# Patient Record
Sex: Female | Born: 1955
Health system: Southern US, Community
[De-identification: ages and names within clinical notes are randomized; demographics above are authoritative.]

## PROBLEM LIST (undated history)

## (undated) DIAGNOSIS — I2510 Atherosclerotic heart disease of native coronary artery without angina pectoris: Secondary | ICD-10-CM

## (undated) DIAGNOSIS — T7840XA Allergy, unspecified, initial encounter: Secondary | ICD-10-CM

## (undated) DIAGNOSIS — K219 Gastro-esophageal reflux disease without esophagitis: Secondary | ICD-10-CM

## (undated) DIAGNOSIS — F32A Depression, unspecified: Secondary | ICD-10-CM

## (undated) DIAGNOSIS — K5792 Diverticulitis of intestine, part unspecified, without perforation or abscess without bleeding: Secondary | ICD-10-CM

## (undated) DIAGNOSIS — G478 Other sleep disorders: Secondary | ICD-10-CM

## (undated) DIAGNOSIS — E119 Type 2 diabetes mellitus without complications: Secondary | ICD-10-CM

## (undated) DIAGNOSIS — G4730 Sleep apnea, unspecified: Secondary | ICD-10-CM

## (undated) DIAGNOSIS — F419 Anxiety disorder, unspecified: Secondary | ICD-10-CM

## (undated) DIAGNOSIS — I10 Essential (primary) hypertension: Secondary | ICD-10-CM

## (undated) DIAGNOSIS — J449 Chronic obstructive pulmonary disease, unspecified: Secondary | ICD-10-CM

## (undated) DIAGNOSIS — E079 Disorder of thyroid, unspecified: Secondary | ICD-10-CM

## (undated) DIAGNOSIS — F329 Major depressive disorder, single episode, unspecified: Secondary | ICD-10-CM

## (undated) DIAGNOSIS — E785 Hyperlipidemia, unspecified: Secondary | ICD-10-CM

## (undated) HISTORY — DX: Disorder of thyroid, unspecified: E07.9

## (undated) HISTORY — DX: Depression, unspecified: F32.A

## (undated) HISTORY — PX: COLONOSCOPY: SHX174

## (undated) HISTORY — DX: Diverticulitis of intestine, part unspecified, without perforation or abscess without bleeding: K57.92

## (undated) HISTORY — DX: Chronic obstructive pulmonary disease, unspecified: J44.9

## (undated) HISTORY — DX: Atherosclerotic heart disease of native coronary artery without angina pectoris: I25.10

## (undated) HISTORY — PX: TUBAL LIGATION: SHX77

## (undated) HISTORY — PX: COLON SURGERY: SHX602

## (undated) HISTORY — PX: DENTAL SURGERY: SHX609

## (undated) HISTORY — DX: Essential (primary) hypertension: I10

## (undated) HISTORY — DX: Sleep apnea, unspecified: G47.30

## (undated) HISTORY — DX: Other sleep disorders: G47.8

## (undated) HISTORY — PX: BREAST EXCISIONAL BIOPSY: SUR124

## (undated) HISTORY — DX: Major depressive disorder, single episode, unspecified: F32.9

## (undated) HISTORY — DX: Hyperlipidemia, unspecified: E78.5

## (undated) HISTORY — PX: CHOLECYSTECTOMY: SHX55

## (undated) HISTORY — DX: Gastro-esophageal reflux disease without esophagitis: K21.9

## (undated) HISTORY — PX: OTHER SURGICAL HISTORY: SHX169

## (undated) HISTORY — DX: Allergy, unspecified, initial encounter: T78.40XA

## (undated) HISTORY — DX: Anxiety disorder, unspecified: F41.9

## (undated) HISTORY — DX: Type 2 diabetes mellitus without complications: E11.9

---

## 1998-11-06 ENCOUNTER — Emergency Department (HOSPITAL_COMMUNITY): Admission: EM | Admit: 1998-11-06 | Discharge: 1998-11-06 | Payer: Self-pay | Admitting: Emergency Medicine

## 1998-11-07 ENCOUNTER — Encounter: Payer: Self-pay | Admitting: Emergency Medicine

## 1999-01-31 ENCOUNTER — Other Ambulatory Visit: Admission: RE | Admit: 1999-01-31 | Discharge: 1999-01-31 | Payer: Self-pay | Admitting: Obstetrics and Gynecology

## 2003-03-06 ENCOUNTER — Other Ambulatory Visit: Admission: RE | Admit: 2003-03-06 | Discharge: 2003-03-06 | Payer: Self-pay | Admitting: Internal Medicine

## 2003-03-31 ENCOUNTER — Emergency Department (HOSPITAL_COMMUNITY): Admission: EM | Admit: 2003-03-31 | Discharge: 2003-03-31 | Payer: Self-pay | Admitting: Emergency Medicine

## 2004-03-17 ENCOUNTER — Other Ambulatory Visit (HOSPITAL_COMMUNITY): Admission: RE | Admit: 2004-03-17 | Discharge: 2004-04-08 | Payer: Self-pay | Admitting: Psychiatry

## 2004-04-01 ENCOUNTER — Encounter: Admission: RE | Admit: 2004-04-01 | Discharge: 2004-04-01 | Payer: Self-pay | Admitting: Psychiatry

## 2004-04-30 ENCOUNTER — Emergency Department (HOSPITAL_COMMUNITY): Admission: EM | Admit: 2004-04-30 | Discharge: 2004-04-30 | Payer: Self-pay | Admitting: *Deleted

## 2005-06-02 ENCOUNTER — Emergency Department (HOSPITAL_COMMUNITY): Admission: EM | Admit: 2005-06-02 | Discharge: 2005-06-02 | Payer: Self-pay | Admitting: Emergency Medicine

## 2006-05-07 ENCOUNTER — Inpatient Hospital Stay (HOSPITAL_COMMUNITY): Admission: EM | Admit: 2006-05-07 | Discharge: 2006-05-10 | Payer: Self-pay | Admitting: Emergency Medicine

## 2006-06-01 ENCOUNTER — Encounter: Admission: RE | Admit: 2006-06-01 | Discharge: 2006-06-01 | Payer: Self-pay | Admitting: General Surgery

## 2006-06-23 ENCOUNTER — Ambulatory Visit (HOSPITAL_COMMUNITY): Admission: RE | Admit: 2006-06-23 | Discharge: 2006-06-23 | Payer: Self-pay | Admitting: General Surgery

## 2006-06-25 ENCOUNTER — Encounter: Payer: Self-pay | Admitting: Vascular Surgery

## 2006-06-25 ENCOUNTER — Emergency Department (HOSPITAL_COMMUNITY): Admission: EM | Admit: 2006-06-25 | Discharge: 2006-06-25 | Payer: Self-pay | Admitting: Emergency Medicine

## 2006-08-12 ENCOUNTER — Encounter (INDEPENDENT_AMBULATORY_CARE_PROVIDER_SITE_OTHER): Payer: Self-pay | Admitting: Specialist

## 2006-08-12 ENCOUNTER — Ambulatory Visit (HOSPITAL_COMMUNITY): Admission: RE | Admit: 2006-08-12 | Discharge: 2006-08-12 | Payer: Self-pay | Admitting: Gastroenterology

## 2006-08-13 ENCOUNTER — Ambulatory Visit (HOSPITAL_COMMUNITY): Admission: RE | Admit: 2006-08-13 | Discharge: 2006-08-13 | Payer: Self-pay | Admitting: Gastroenterology

## 2006-12-10 ENCOUNTER — Encounter (INDEPENDENT_AMBULATORY_CARE_PROVIDER_SITE_OTHER): Payer: Self-pay | Admitting: Specialist

## 2006-12-10 ENCOUNTER — Inpatient Hospital Stay (HOSPITAL_COMMUNITY): Admission: RE | Admit: 2006-12-10 | Discharge: 2006-12-18 | Payer: Self-pay | Admitting: General Surgery

## 2007-01-14 ENCOUNTER — Ambulatory Visit (HOSPITAL_COMMUNITY): Admission: RE | Admit: 2007-01-14 | Discharge: 2007-01-14 | Payer: Self-pay | Admitting: Family Medicine

## 2010-12-21 ENCOUNTER — Encounter: Payer: Self-pay | Admitting: Family Medicine

## 2011-04-17 NOTE — Op Note (Signed)
NAMECHRISELDA, LEPPERT                ACCOUNT NO.:  [id number]   MEDICAL RECORD NO.:  [id number]          PATIENT TYPE:  INP   LOCATION:  2550                         FACILITY:  [facility]   PHYSICIAN:  Ollen Gross. Vernell Morgans, M.D. DATE OF BIRTH:  [birth year]   DATE OF PROCEDURE:  12/10/2006  DATE OF DISCHARGE:                               OPERATIVE REPORT   PREOPERATIVE DIAGNOSIS:  History of complicated sigmoid diverticulitis  and gallstones.   POSTOPERATIVE DIAGNOSIS:  History of complicated sigmoid diverticulitis  and gallstones.   PROCEDURE:  Sigmoid colectomy and cholecystectomy.   SURGEON:  Ollen Gross. Vernell Morgans, M.D.   ASSISTANT:  Wilmon Arms. Tsuei, M.D.   ANESTHESIA:  General endotracheal.   PROCEDURE:  After informed consent was obtained, the patient was brought  to the operating room and placed in the supine position on the operating  table.  After adequate induction of general anesthesia, the patient was  moved into lithotomy position, and her abdomen and perineal area was  prepped with Betadine and draped in the usual sterile manner.   A midline incision was made with a 10 blade knife.  This incision was  carried down through the skin and subcutaneous tissue sharply with  electrocautery until the linea alba was identified.  The linea alba was  also incised with the electrocautery.  The preperitoneal space was  probed bluntly with a hemostat until the peritoneum was identified.  The  peritoneum was grasped between hemostats and opened with Metzenbaum  scissors.  This allowed access to the abdominal cavity.  The rest of the  incision was then opened under direct vision with the electrocautery.  There were no adhesions to the anterior abdominal wall.  The patient was  placed in some Trendelenburg position.  There was some omentum that was  adherent to the left lower quadrant, and this was mobilized by a  combination of sharp Bovie dissection and division of these adhesions  with the  LigaSure.  Once this was accomplished, the whole left colon and  sigmoid colon were able to be evaluated.  The area of the sigmoid colon  appeared to be the mesentery.  At this point, it appeared to be a little  bit thicker than the rest of the colon, and the area for resection was  identified.  The rest of the actual colon looked good on the surface.  A  site was chosen above the thickened area, and the mesentery at this  point was opened sharply with the electrocautery.  An Freida Busman and Kocher  clamp were placed across the colon at this point and clamped.  The colon  was divided between these two.  The mesentery to the sigmoid colon was  then taken down with the LigaSure.  One vessel was also suture ligated  with a 3-0 silk stitch.  A site was chosen below the area of thickening  in the rectosigmoid also for division of the colon, and the mesenteric  dissection was brought up to the colon wall at this point.  A Satinsky  clamp and a Kocher clamp were placed  across the colon at this point, and  then the colon was divided between these two.  Once this was  accomplished, the sigmoid colon was removed and sent to pathology for  further evaluation.  The proximal and distal ends of colon reached each  other easily without any tension, and both appeared to be very healthy.  An end-to-end anastomosis was created with full-thickness 2-0 silk  stitches.  This was done interrupted circumferentially.  Once this was  accomplished, the anastomosis appeared to be nicely approximated without  any evidence of leak, and both ends appeared to be healthy.  There was  no tension.  The abdomen was then irrigated with copious amounts of  saline.   At this point, the patient was then placed in some reverse  Trendelenburg.  A Bookwalter retractor was deployed.  The gallbladder  was readily identified.  The base of the gallbladder was then dissected  by some sharp Bovie dissection to open the peritoneal reflection,   and  then blunt dissection was carried out in this area until the gallbladder  neck/cystic duct junction was readily identified, and a good window was  created.  Two clips were placed on the cystic duct and one on the  gallbladder neck, and the cystic duct was divided between the two.  The  cystic artery was also identified and again dissected bluntly in a  circumferential manner until a good window was created.  Two clips were  placed proximally and one distally on the artery, and the artery was  divided between the two.  Next, the gallbladder was dissected away from  the liver bed by top-down dissection technique using the electrocautery.  Once this was accomplished, the gallbladder was removed from the liver  bed and sent to pathology.  The liver bed was examined and found to be  completely hemostatic.  Again, the abdomen was irrigated with copious  amounts of saline.  All the retractors were removed.  The colon  anastomosis was again checked and looked very good.  At this point, the  fascia of the anterior abdominal wall was closed with two running #1  looped PDS sutures.  The subcutaneous tissue was irrigated with copious  amounts of saline and Betadine, and the skin was closed with staples.   The patient tolerated the procedure well.  At the end of the case, all  needle, sponge, and instrument  counts were correct.  The patient was  then awakened and taken to recovery in stable condition.      Ollen Gross. Vernell Morgans, M.D.  Electronically Signed     PST/MEDQ  D:  12/10/2006  T:  12/10/2006  Job:  [id number]

## 2011-04-17 NOTE — Discharge Summary (Signed)
Felicia Hanson, Felicia Hanson                ACCOUNT NO.:  [id number]   MEDICAL RECORD NO.:  [id number]          PATIENT TYPE:  INP   LOCATION:  5706                         FACILITY:  [facility]   PHYSICIAN:  Ollen Gross. Vernell Morgans, M.D. DATE OF BIRTH:  1956-04-08   DATE OF ADMISSION:  12/10/2006  DATE OF DISCHARGE:  12/18/2006                               DISCHARGE SUMMARY   HISTORY:  Ms. Heslin is a 55 year old white female who had a history of  complicated sigmoid diverticulitis as well as gallstones.  She was  brought to the operating room on December 10, 2006 and underwent a  sigmoid colectomy and cholecystectomy.  She tolerated the surgery well.  She was left on ice chips until December 14, 2006, at which time she  began to have bowel sounds.  She was started on clear liquids on December 14, 2006 and was ambulating.  Once she was taking p.o.'s her psych  medications were restarted and she continued to slowly improve.  She did  develop some cellulitis in her incision and she was started on Cipro and  Flagyl.  On December 17, 2006 her incision was opened and a small amount  of seroma fluid was drained, but there was no gross infection.  She  continued to improve and on December 18, 2006 she was ready for discharge  home.   DISCHARGE DIET:  Her diet is as tolerated.   ACTIVITIES:  No heavy lifting.   FINAL DIAGNOSIS:  Sigmoid diverticulitis and gallstones.   DISCHARGE MEDICATIONS:  She was to resume her home medications.  She was  given a prescription for Vicodin for pain.   FOLLOWUPS:  Home health nursing was arranged for dressing changes.  Followup with Dr. Carolynne Edouard in 1 week.   DISPOSITION:  She is discharged to home.      Ollen Gross. Vernell Morgans, M.D.  Electronically Signed     PST/MEDQ  D:  03/31/2007  T:  03/31/2007  Job:  [id number]

## 2011-04-17 NOTE — Op Note (Signed)
Felicia Hanson, Felicia Hanson                ACCOUNT NO.:  [id number]   MEDICAL RECORD NO.:  [id number]          PATIENT TYPE:  AMB   LOCATION:  ENDO                         [facility]AC[facility]   PHYSICIAN:  Shirley Friar, MDDATE OF BIRTH:  [birth year]   DATE OF PROCEDURE:  08/12/2006  DATE OF DISCHARGE:                                 OPERATIVE REPORT   PROCEDURE:  Upper endoscopy.   INDICATIONS:  Heartburn.   MEDICATIONS:  Propofol infusion by Anesthesia.   FINDINGS:  The endoscope was inserted through the oropharynx and the  esophagus was intubated.  The distal portion of the esophagus revealed mild  erythema and one superficial ulceration consistent with LA grade A erosive  esophagitis.  Endoscope was passed down to the stomach, which was normal in  its entirety including normal body, antrum, fundus, cardia and angularis.  Retroflexion was done to confirm a normal proximal stomach.  Endoscope was  straightened and advanced into the duodenal bulb, where scattered  erythematous folds were noted with some nodularity consistent with  duodenitis.  One biopsy was taken of this area.  Endoscope was then advanced  down to the second portion of the duodenum, which was normal in appearance.   ASSISTANT:  1. Mild erosive esophagitis.  2. Mild duodenitis, status post biopsy.   PLAN:  1. Prevacid 30 mg p.o. daily x8 weeks.  2. Follow-up on pathology.  3. Avoid NSAIDs.      Shirley Friar, MD  Electronically Signed     VCS/MEDQ  D:  08/12/2006  T:  08/13/2006  Job:  [id number]   cc:   Ollen Gross. Vernell Morgans, M.D.  [address]  [place]  [place] [address]   Stan Head. Cleta Alberts, M.D.  Fax: [phone]

## 2011-04-17 NOTE — Op Note (Signed)
NAMEVERONDA, Felicia Hanson                ACCOUNT NO.:  [id number]   MEDICAL RECORD NO.:  [id number]          PATIENT TYPE:  AMB   LOCATION:  ENDO                         [facility]   PHYSICIAN:  Shirley Friar, MDDATE OF BIRTH:  Jun 13, 1956   DATE OF PROCEDURE:  08/12/2006  DATE OF DISCHARGE:                                 OPERATIVE REPORT   PROCEDURE:  Colonoscopy.   INDICATIONS:  History of diverticulitis, screening.   MEDICATIONS:  Propofol infusion by Anesthesia.   FINDINGS:  Rectal exam was normal.  A adult adjustable colonoscope was  inserted into a fair prepped colon and advanced to cecum where the ileocecal  valve and appendiceal orifice were identified.  On careful withdrawal of the  colonoscope revealed a focal erythematous area at the splenic flexure  possibly secondary to suction trauma.  This area was biopsied x2.  A small  amount of post biopsy bleeding was noted that resolved spontaneously.  On  further withdrawal the colonoscope revealed small scattered sigmoid  diverticulosis.  No mass or other mucosal abnormalities were seen.  Retroflexion showed small internal hemorrhoids.   ASSESSMENT:  1. Sigmoid diverticulosis (small, scattered).  2. Small internal hemorrhoids.  3. Local erythematous areas, splenic flexure, likely due to suction -      status post biopsy x2.   PLAN:  1. High-fiber diet.  2. No aspirin products for 14 days.  3. Follow-up on path.  4. Repeat colonoscopy in 5 years.      Shirley Friar, MD  Electronically Signed     VCS/MEDQ  D:  08/12/2006  T:  08/13/2006  Job:  [id number]   cc:   Felicia Hanson. Felicia Hanson, M.D.  [address]  [place]  [place] [address]   Felicia Hanson. Felicia Hanson, M.D.  Fax: [phone]

## 2011-05-05 ENCOUNTER — Emergency Department (HOSPITAL_COMMUNITY)
Admission: EM | Admit: 2011-05-05 | Discharge: 2011-05-05 | Discharge status: Against Medical Advice | Payer: 59 | Attending: Emergency Medicine | Admitting: Emergency Medicine

## 2011-05-05 DIAGNOSIS — F341 Dysthymic disorder: Secondary | ICD-10-CM | POA: Insufficient documentation

## 2011-05-05 DIAGNOSIS — E039 Hypothyroidism, unspecified: Secondary | ICD-10-CM | POA: Insufficient documentation

## 2011-05-05 DIAGNOSIS — R079 Chest pain, unspecified: Secondary | ICD-10-CM | POA: Insufficient documentation

## 2011-05-05 DIAGNOSIS — R609 Edema, unspecified: Secondary | ICD-10-CM | POA: Insufficient documentation

## 2012-02-04 ENCOUNTER — Other Ambulatory Visit: Payer: Self-pay | Admitting: Family Medicine

## 2012-03-07 ENCOUNTER — Other Ambulatory Visit: Payer: Self-pay | Admitting: Family Medicine

## 2012-03-30 ENCOUNTER — Other Ambulatory Visit: Payer: Self-pay | Admitting: Physician Assistant

## 2012-05-03 ENCOUNTER — Other Ambulatory Visit: Payer: Self-pay | Admitting: Family Medicine

## 2012-05-12 ENCOUNTER — Other Ambulatory Visit: Payer: Self-pay | Admitting: Family Medicine

## 2012-06-06 ENCOUNTER — Telehealth: Payer: Self-pay

## 2012-06-06 MED ORDER — AMLODIPINE BESYLATE 5 MG PO TABS: 5.0000 mg | ORAL_TABLET | Freq: Every day | ORAL | Status: DC

## 2012-06-06 MED ORDER — LEVOTHYROXINE SODIUM 125 MCG PO TABS: 125.0000 ug | ORAL_TABLET | Freq: Every day | ORAL | Status: DC

## 2012-06-06 MED ORDER — PRAVASTATIN SODIUM 40 MG PO TABS: 40.0000 mg | ORAL_TABLET | Freq: Every day | ORAL | Status: DC

## 2012-06-06 NOTE — Telephone Encounter (Signed)
Is it possible to give her enough to last until Friday or should she come into walk in clinic for refill?

## 2012-06-06 NOTE — Telephone Encounter (Signed)
Patient made an appt with Dr Audria Nine for Friday for refills for thyroid,cholesterol, and blood pressure. She is currently out of her meds, is having bad headaches, and wanted to know if we could call in enough to last her until Friday. [phone]  [facility] [address]

## 2012-06-06 NOTE — Telephone Encounter (Signed)
Patient notified

## 2012-06-06 NOTE — Telephone Encounter (Signed)
Rx sent to pharmacy   

## 2012-06-10 ENCOUNTER — Ambulatory Visit: Payer: Self-pay | Admitting: Family Medicine

## 2012-08-19 ENCOUNTER — Ambulatory Visit (INDEPENDENT_AMBULATORY_CARE_PROVIDER_SITE_OTHER): Payer: 59 | Admitting: Emergency Medicine

## 2012-08-19 VITALS — BP 144/86 | HR 82 | Temp 98.2°F | Resp 16 | Ht 59.5 in | Wt 168.8 lb

## 2012-08-19 DIAGNOSIS — I10 Essential (primary) hypertension: Secondary | ICD-10-CM

## 2012-08-19 DIAGNOSIS — R350 Frequency of micturition: Secondary | ICD-10-CM

## 2012-08-19 DIAGNOSIS — N3000 Acute cystitis without hematuria: Secondary | ICD-10-CM

## 2012-08-19 DIAGNOSIS — E039 Hypothyroidism, unspecified: Secondary | ICD-10-CM

## 2012-08-19 LAB — POCT URINALYSIS DIPSTICK
Bilirubin, UA: NEGATIVE
Glucose, UA: NEGATIVE
Ketones, UA: NEGATIVE
Nitrite, UA: NEGATIVE
Protein, UA: NEGATIVE
Spec Grav, UA: 1.02
Urobilinogen, UA: 1
pH, UA: 7

## 2012-08-19 LAB — POCT UA - MICROSCOPIC ONLY
Casts, Ur, LPF, POC: NEGATIVE
Crystals, Ur, HPF, POC: NEGATIVE
Mucus, UA: POSITIVE
Yeast, UA: NEGATIVE

## 2012-08-19 MED ORDER — LEVOTHYROXINE SODIUM 125 MCG PO TABS: 125.0000 ug | ORAL_TABLET | Freq: Every day | ORAL | Status: DC

## 2012-08-19 MED ORDER — PRAVASTATIN SODIUM 40 MG PO TABS: 40.0000 mg | ORAL_TABLET | Freq: Every day | ORAL | Status: DC

## 2012-08-19 MED ORDER — VITAMIN D (ERGOCALCIFEROL) 1.25 MG (50000 UNIT) PO CAPS: 50000.0000 [IU] | ORAL_CAPSULE | ORAL | Status: DC

## 2012-08-19 MED ORDER — PHENAZOPYRIDINE HCL 200 MG PO TABS: 200.0000 mg | ORAL_TABLET | Freq: Three times a day (TID) | ORAL | Status: DC | PRN

## 2012-08-19 MED ORDER — SULFAMETHOXAZOLE-TRIMETHOPRIM 800-160 MG PO TABS: 1.0000 | ORAL_TABLET | Freq: Two times a day (BID) | ORAL | Status: DC

## 2012-08-19 MED ORDER — AMLODIPINE BESYLATE 5 MG PO TABS: 5.0000 mg | ORAL_TABLET | Freq: Every day | ORAL | Status: DC

## 2012-08-19 NOTE — Progress Notes (Signed)
Date:  08/19/2012   Name:  Felicia Hanson   DOB:  05-Apr-1956   MRN:  [id number] Gender: female Age: 56 y.o.  PCP:  Tally Due, MD    Chief Complaint: Medication Refill, Urinary Frequency and Immunizations   History of Present Illness:  Felicia Hanson is a 56 y.o. pleasant patient who presents with the following:  History of hypertension and hypothyroidism.  Has been out of medication since beginning or August.  Had financial troubles that prevented her from obtaining her medication.  Has dysuria, urgency and frequency.  No fever or chills, nausea or vomiting. No vaginal discharge or bleeding.  No acute other complaints.  There is no problem list on file for this patient.   No past medical history on file.  No past surgical history on file.  History  Substance Use Topics  . Smoking status: Current Every Day Smoker -- 1.0 packs/day for 38 years    Types: Cigarettes  . Smokeless tobacco: Not on file  . Alcohol Use: No    No family history on file.  No Known Allergies  Medication list has been reviewed and updated.  Outpatient Prescriptions Prior to Visit  Medication Sig Dispense Refill  . amLODipine (NORVASC) 5 MG tablet Take 1 tablet (5 mg total) by mouth daily.  30 tablet  0  . levothyroxine (SYNTHROID, LEVOTHROID) 125 MCG tablet Take 1 tablet (125 mcg total) by mouth daily.  30 tablet  0  . pravastatin (PRAVACHOL) 40 MG tablet Take 1 tablet (40 mg total) by mouth daily.  30 tablet  0    Review of Systems:  As per HPI, otherwise negative.    Physical Examination: Filed Vitals:   08/19/12 1100  BP: 144/86  Pulse: 82  Temp: 98.2 F (36.8 C)  Resp: 16   Filed Vitals:   08/19/12 1100  Height: 4' 11.5" (1.511 m)  Weight: 168 lb 12.8 oz (76.567 kg)   Body mass index is 33.52 kg/(m^2). Ideal Body Weight: Weight in (lb) to have BMI = 25: 125.6   GEN: WDWN, NAD, Non-toxic, A & O x 3 HEENT: Atraumatic, Normocephalic. Neck supple. No masses, No  LAD. Ears and Nose: No external deformity. CV: RRR, No M/G/R. No JVD. No thrill. No extra heart sounds. PULM: CTA B, no wheezes, crackles, rhonchi. No retractions. No resp. distress. No accessory muscle use. ABD: S, NT, ND, +BS. No rebound. No HSM. EXTR: No c/c/e NEURO Normal gait.  PSYCH: Normally interactive. Conversant. Not depressed or anxious appearing.  Calm demeanor.    Assessment and Plan: Hypertension Hypothyroidism Cystitis Refill meds Labs in one month Septra ds pyridium   Carmelina Dane, MD  \ Results for orders placed in visit on 08/19/12  POCT URINALYSIS DIPSTICK      Component Value Range   Color, UA yellow     Clarity, UA cloudy     Glucose, UA neg     Bilirubin, UA neg     Ketones, UA neg     Spec Grav, UA 1.020     Blood, UA trace lysed     pH, UA 7.0     Protein, UA neg     Urobilinogen, UA 1.0     Nitrite, UA neg     Leukocytes, UA moderate (2+)    POCT UA - MICROSCOPIC ONLY      Component Value Range   WBC, Ur, HPF, POC 18-25     RBC, urine, microscopic 0-4  Bacteria, U Microscopic 2+     Mucus, UA positive     Epithelial cells, urine per micros 6-8     Crystals, Ur, HPF, POC neg     Casts, Ur, LPF, POC neg     Yeast, UA neg     I have reviewed and agree with documentation. Robert P. Merla Riches, M.D.

## 2012-11-18 ENCOUNTER — Other Ambulatory Visit: Payer: Self-pay | Admitting: Emergency Medicine

## 2012-12-30 ENCOUNTER — Other Ambulatory Visit: Payer: Self-pay | Admitting: Emergency Medicine

## 2012-12-30 NOTE — Telephone Encounter (Signed)
Needs office visit and labs.

## 2013-01-04 ENCOUNTER — Ambulatory Visit: Payer: 59 | Admitting: Family Medicine

## 2013-01-05 ENCOUNTER — Other Ambulatory Visit: Payer: Self-pay | Admitting: Physician Assistant

## 2013-01-05 NOTE — Telephone Encounter (Signed)
Pt due for labs Oct 2013

## 2013-02-26 ENCOUNTER — Other Ambulatory Visit: Payer: Self-pay | Admitting: Physician Assistant

## 2013-04-10 ENCOUNTER — Other Ambulatory Visit: Payer: Self-pay | Admitting: Physician Assistant

## 2013-04-11 ENCOUNTER — Other Ambulatory Visit: Payer: Self-pay | Admitting: Physician Assistant

## 2013-05-03 ENCOUNTER — Ambulatory Visit (INDEPENDENT_AMBULATORY_CARE_PROVIDER_SITE_OTHER): Payer: 59 | Admitting: Family Medicine

## 2013-05-03 ENCOUNTER — Ambulatory Visit: Payer: 59

## 2013-05-03 VITALS — BP 152/93 | HR 77 | Temp 98.4°F | Resp 18 | Wt 166.0 lb

## 2013-05-03 DIAGNOSIS — M79605 Pain in left leg: Secondary | ICD-10-CM

## 2013-05-03 DIAGNOSIS — M545 Low back pain, unspecified: Secondary | ICD-10-CM

## 2013-05-03 DIAGNOSIS — E7800 Pure hypercholesterolemia, unspecified: Secondary | ICD-10-CM

## 2013-05-03 DIAGNOSIS — M79609 Pain in unspecified limb: Secondary | ICD-10-CM

## 2013-05-03 DIAGNOSIS — E039 Hypothyroidism, unspecified: Secondary | ICD-10-CM

## 2013-05-03 DIAGNOSIS — I10 Essential (primary) hypertension: Secondary | ICD-10-CM

## 2013-05-03 LAB — COMPREHENSIVE METABOLIC PANEL
ALT: 23 U/L (ref 0–35)
AST: 32 U/L (ref 0–37)
Albumin: 4.3 g/dL (ref 3.5–5.2)
Alkaline Phosphatase: 89 U/L (ref 39–117)
BUN: 7 mg/dL (ref 6–23)
CO2: 29 mEq/L (ref 19–32)
Calcium: 9.6 mg/dL (ref 8.4–10.5)
Chloride: 103 mEq/L (ref 96–112)
Creat: 0.81 mg/dL (ref 0.50–1.10)
Glucose, Bld: 90 mg/dL (ref 70–99)
Potassium: 4.3 mEq/L (ref 3.5–5.3)
Sodium: 140 mEq/L (ref 135–145)
Total Bilirubin: 0.4 mg/dL (ref 0.3–1.2)
Total Protein: 7.7 g/dL (ref 6.0–8.3)

## 2013-05-03 LAB — POCT CBC
Granulocyte percent: 49.1 %G (ref 37–80)
HCT, POC: 49.2 % — AB (ref 37.7–47.9)
Hemoglobin: 15.8 g/dL (ref 12.2–16.2)
Lymph, poc: 4.1 — AB (ref 0.6–3.4)
MCH, POC: 29.8 pg (ref 27–31.2)
MCHC: 32.1 g/dL (ref 31.8–35.4)
MCV: 92.8 fL (ref 80–97)
MID (cbc): 0.8 (ref 0–0.9)
MPV: 10.3 fL (ref 0–99.8)
POC Granulocyte: 4.8 (ref 2–6.9)
POC LYMPH PERCENT: 42.4 %L (ref 10–50)
POC MID %: 8.5 %M (ref 0–12)
Platelet Count, POC: 249 10*3/uL (ref 142–424)
RBC: 5.3 M/uL (ref 4.04–5.48)
RDW, POC: 13.3 %
WBC: 9.7 10*3/uL (ref 4.6–10.2)

## 2013-05-03 LAB — LIPID PANEL
Cholesterol: 219 mg/dL — ABNORMAL HIGH (ref 0–200)
HDL: 35 mg/dL — ABNORMAL LOW (ref >39)
LDL Cholesterol: 140 mg/dL — ABNORMAL HIGH (ref 0–99)
Total CHOL/HDL Ratio: 6.3 Ratio
Triglycerides: 220 mg/dL — ABNORMAL HIGH (ref <150)
VLDL: 44 mg/dL — ABNORMAL HIGH (ref 0–40)

## 2013-05-03 LAB — POCT URINALYSIS DIPSTICK
Bilirubin, UA: NEGATIVE
Glucose, UA: NEGATIVE
Ketones, UA: NEGATIVE
Leukocytes, UA: NEGATIVE
Nitrite, UA: NEGATIVE
Protein, UA: NEGATIVE
Spec Grav, UA: 1.015
Urobilinogen, UA: 1
pH, UA: 6

## 2013-05-03 LAB — POCT UA - MICROSCOPIC ONLY
Casts, Ur, LPF, POC: NEGATIVE
Crystals, Ur, HPF, POC: NEGATIVE
Mucus, UA: NEGATIVE
Yeast, UA: NEGATIVE

## 2013-05-03 LAB — TSH: TSH: 0.273 u[IU]/mL — ABNORMAL LOW (ref 0.350–4.500)

## 2013-05-03 MED ORDER — SYNTHROID 125 MCG PO TABS: 125.0000 ug | ORAL_TABLET | Freq: Every day | ORAL | Status: DC

## 2013-05-03 MED ORDER — AMLODIPINE BESYLATE 5 MG PO TABS: 5.0000 mg | ORAL_TABLET | Freq: Every day | ORAL | Status: DC

## 2013-05-03 MED ORDER — CYCLOBENZAPRINE HCL 5 MG PO TABS: 5.0000 mg | ORAL_TABLET | Freq: Every evening | ORAL | Status: DC | PRN

## 2013-05-03 MED ORDER — PRAVASTATIN SODIUM 40 MG PO TABS: 40.0000 mg | ORAL_TABLET | Freq: Every day | ORAL | Status: DC

## 2013-05-03 MED ORDER — NAPROXEN 500 MG PO TABS: 500.0000 mg | ORAL_TABLET | Freq: Two times a day (BID) | ORAL | Status: DC

## 2013-05-03 NOTE — Patient Instructions (Signed)

## 2013-05-03 NOTE — Progress Notes (Signed)
[address]   [place], [place]  [address]   [phone]  Subjective:    Patient ID: Felicia Hanson, female    DOB: 14-Jul-1956, 57 y.o.   MRN: [id number]  HPI This 57 y.o. female presents for evaluation of the following:  1. L lower leg pain:   Two nights ago, woke up in tears due to L lower leg pain; felt like in the bone; felt like a bad tooth ache in the bone.  Pain from L knee and down to foot.  Soaked leg in hot bath tub without improvement; worried about blood clot.  Cleaned house yesterday; with ambulatioin, pain improved; no pain throughtout the day; sat down at 6:00pm; onset of mild pain.  Elevated leg last night.  Sleeps on couch/recliner.   No associated back pain.  Has been carrying one year old grandson for last week. No n/t/w in leg.  Strange feeling anterior shin.  No swelling or redness in leg.  Ingrown toenail on L toe.  No calf pain.  No surgeries.  No prolonged ambulation.  No sedentary lifestyle.  No family history of blood clots; no HRT.  2. HTN: blood pressure last night 170 systolic.  Ran out of medication; no side effects to medication; 130s on medication; does not check BP at home.  3. Depression with anxiety:  Followed by psychiatry; took Xanax last night for panic attack.  Slept really well last night after taking 1/2 Xanax.  Horrible insomnia.  Evelene Croon; followed every six months.  4. Hypothyroidism:  Compliance with replacement; due for labs.    5.  Hypercholesterolemia: out of medication.  Non-compliant with diet.  PCP: Hal Hope.   Review of Systems  Constitutional: Negative for fever, chills, diaphoresis and fatigue.  Eyes: Negative for photophobia and visual disturbance.  Respiratory: Positive for cough. Negative for shortness of breath, wheezing and stridor.   Cardiovascular: Negative for chest pain, palpitations and leg swelling.  Gastrointestinal: Positive for constipation. Negative for nausea, vomiting, abdominal pain and diarrhea.       Upper abdominal  bulge.  Worried about hernia.  Endocrine: Negative for cold intolerance, heat intolerance, polydipsia, polyphagia and polyuria.  Musculoskeletal: Positive for myalgias. Negative for back pain, arthralgias and gait problem.  Neurological: Negative for weakness and numbness.    Past Medical History  Diagnosis Date  . Depression   . Anxiety   . Hypertension   . Hyperlipidemia   . Thyroid disease     Past Surgical History  Procedure Laterality Date  . Cholecystectomy    . Tubal ligation    . Colon surgery      diverticulitis with perforation; s/p colon resection.  . Colonoscopy      Prior to Admission medications   Medication Sig Start Date End Date Taking? Authorizing Provider  SYNTHROID 125 MCG tablet Take 1 tablet (125 mcg total) by mouth daily before breakfast. PATIENT NEEDS OFFICE VISIT FOR ADDITIONAL REFILLS - 2nd notice 04/11/13  Yes Eleanore E Egan, PA-C  amLODipine (NORVASC) 5 MG tablet Take 1 tablet (5 mg total) by mouth daily. Needs office visit (final notice) 02/26/13   Nelva Nay, PA-C  buPROPion (WELLBUTRIN XL) 150 MG 24 hr tablet Take 150 mg by mouth daily.    Historical Provider, MD  DULoxetine (CYMBALTA) 60 MG capsule Take 60 mg by mouth daily.    Historical Provider, MD  pravastatin (PRAVACHOL) 40 MG tablet Take 1 tablet (40 mg total) by mouth daily. 08/19/12 08/19/13  Phillips Odor,  MD  Vitamin D, Ergocalciferol, (DRISDOL) 50000 UNITS CAPS Take 1 capsule (50,000 Units total) by mouth every 7 (seven) days. 08/19/12   Phillips Odor, MD    No Known Allergies  History   Social History  . Marital Status: Married    Spouse Name: N/A    Number of Children: N/A  . Years of Education: N/A   Occupational History  . Not on file.   Social History Main Topics  . Smoking status: Current Every Day Smoker -- 1.00 packs/day for 38 years    Types: Cigarettes  . Smokeless tobacco: Not on file  . Alcohol Use: No  . Drug Use: No  . Sexually Active: Yes   Other  Topics Concern  . 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 Cancer Mother     unknown primary  . Hyperlipidemia Mother        Objective:   Physical Exam  Nursing note and vitals reviewed. Constitutional: She is oriented to person, place, and time. She appears well-developed and well-nourished. No distress.  HENT:  Head: Normocephalic and atraumatic.  Right Ear: External ear normal.  Left Ear: External ear normal.  Nose: Nose normal.  Mouth/Throat: Oropharynx is clear and moist.  Eyes: Conjunctivae and EOM are normal. Pupils are equal, round, and reactive to light.  Neck: Normal range of motion. Neck supple. No thyromegaly present.  Cardiovascular: Normal rate, regular rhythm and normal heart sounds.  Exam reveals no gallop and no friction rub.   No murmur heard. Pulmonary/Chest: Effort normal and breath sounds normal. She has no wheezes. She has no rales.  Abdominal: Soft. Bowel sounds are normal. She exhibits no distension. There is no tenderness. There is no rebound and no guarding.  +well healed midline incision/scar; +palpable hernia R superior region of umbilicus.  Musculoskeletal:       Left knee: Normal.       Left ankle: Normal.       Lumbar back: Normal. She exhibits normal range of motion, no tenderness, no bony tenderness, no swelling, no pain and no spasm.       Left lower leg: She exhibits no tenderness, no bony tenderness, no swelling, no edema, no deformity and no laceration.  Lymphadenopathy:    She has no cervical adenopathy.  Neurological: She is alert and oriented to person, place, and time.  Skin: Skin is warm and dry. No rash noted. She is not diaphoretic.  Psychiatric: She has a normal mood and affect. Her  behavior is normal.   Results for orders placed in visit on 05/03/13  POCT CBC      Result Value Range   WBC 9.7  4.6 - 10.2 K/uL   Lymph, poc 4.1 (*) 0.6 - 3.4   POC LYMPH PERCENT 42.4  10 - 50 %L   MID (cbc) 0.8  0 - 0.9   POC MID % 8.5  0 - 12 %M   POC Granulocyte 4.8  2 - 6.9   Granulocyte percent 49.1  37 - 80 %G   RBC 5.30  4.04 - 5.48 M/uL   Hemoglobin 15.8  12.2 - 16.2 g/dL   HCT, POC 16.1 (*) 09.6 - 47.9 %   MCV 92.8  80 - 97 fL   MCH, POC 29.8  27 - 31.2 pg   MCHC 32.1  31.8 - 35.4 g/dL   RDW, POC 04.5     Platelet Count, POC 249  142 - 424 K/uL   MPV 10.3  0 - 99.8 fL  POCT URINALYSIS DIPSTICK      Result Value Range   Color, UA yellow     Clarity, UA clear     Glucose, UA neg     Bilirubin, UA neg     Ketones, UA neg     Spec Grav, UA 1.015     Blood, UA trace-intact     pH, UA 6.0     Protein, UA neg     Urobilinogen, UA 1.0     Nitrite, UA neg     Leukocytes, UA Negative    POCT UA - MICROSCOPIC ONLY      Result Value Range   WBC, Ur, HPF, POC 0-3     RBC, urine, microscopic 1-3     Bacteria, U Microscopic trace     Mucus, UA neg     Epithelial cells, urine per micros 1-2     Crystals, Ur, HPF, POC neg     Casts, Ur, LPF, POC neg     Yeast, UA neg     [facility] reading (PRIMARY) by  Dr. Katrinka Blazing.  LS SPINE: NAD ; L TIB-FIB:  NAD       Assessment & Plan:  Essential hypertension, benign - Plan: POCT urinalysis dipstick, POCT UA - Microscopic Only, Comprehensive metabolic panel  Pure hypercholesterolemia - Plan: Lipid panel  Unspecified hypothyroidism - Plan: POCT CBC, TSH  Leg pain, anterior, left - Plan: DG Tibia/Fibula Left, DG Lumbar Spine 2-3 Views  1. HTN: uncontrolled due to non-compliance with medical follow-up.  Obtain labs; refill of medication provided. 2.  Hypercholesterolemia: uncontrolled due to non-compliance with medical follow-up; obtain labs; refill provided. 3.  Hypothyroidism: stable; obtain labs; refills provided. 4.  L anterior  leg pain: New.  No evidence of DVT; obtain LS spine films and L tib-fib films; rx for Naprosyn and Flexeril provided to use scheduled for next two weeks; avoid heavy lifting. Suspect secondary to LS spine radiculopathy.  Home exercise program provided to perform daily; avoid heavy lifting or repetitive bending/twisting/rotating. Call office in two weeks if no improvement for ortho referral.  Meds ordered this encounter  Medications  . SYNTHROID 125 MCG tablet    Sig: Take 1 tablet (125 mcg total) by mouth daily before breakfast.    Dispense:  30 tablet    Refill:  11  . pravastatin (PRAVACHOL) 40 MG tablet    Sig: Take 1 tablet (40 mg total) by mouth daily.    Dispense:  30 tablet    Refill:  5  . amLODipine (NORVASC) 5 MG tablet    Sig: Take 1 tablet (5 mg total) by mouth daily. Needs office visit (final notice)    Dispense:  30 tablet    Refill:  5  . naproxen (NAPROSYN) 500 MG tablet    Sig: Take 1 tablet (500 mg total) by mouth 2 (two) times daily with a meal.    Dispense:  30 tablet    Refill:  0  . cyclobenzaprine (FLEXERIL) 5 MG tablet    Sig: Take 1 tablet (5 mg total) by mouth at bedtime  as needed for muscle spasms.    Dispense:  30 tablet    Refill:  0

## 2013-09-25 ENCOUNTER — Telehealth: Payer: Self-pay

## 2013-09-25 NOTE — Telephone Encounter (Signed)
Patient of Dr Katrinka Blazing. Gets her rx from [facility]. Wants to know if from now on she can get 90 supply of all her meds instead of 30 day supply. Cb# [id number]. Just got refills on all her meds but fyi for next time.

## 2013-09-25 NOTE — Telephone Encounter (Signed)
Saved [facility] in computer, when she calls back it will be in there.

## 2013-11-03 ENCOUNTER — Emergency Department (HOSPITAL_COMMUNITY)
Admission: EM | Admit: 2013-11-03 | Discharge: 2013-11-03 | Disposition: A | Discharge status: Home / Self Care | Payer: 59 | Attending: Emergency Medicine | Admitting: Emergency Medicine

## 2013-11-03 ENCOUNTER — Emergency Department (HOSPITAL_COMMUNITY): Payer: 59

## 2013-11-03 ENCOUNTER — Other Ambulatory Visit: Payer: Self-pay

## 2013-11-03 ENCOUNTER — Encounter (HOSPITAL_COMMUNITY): Payer: Self-pay | Admitting: Emergency Medicine

## 2013-11-03 DIAGNOSIS — F419 Anxiety disorder, unspecified: Secondary | ICD-10-CM

## 2013-11-03 DIAGNOSIS — Z9851 Tubal ligation status: Secondary | ICD-10-CM | POA: Insufficient documentation

## 2013-11-03 DIAGNOSIS — F411 Generalized anxiety disorder: Secondary | ICD-10-CM | POA: Insufficient documentation

## 2013-11-03 DIAGNOSIS — M546 Pain in thoracic spine: Secondary | ICD-10-CM | POA: Insufficient documentation

## 2013-11-03 DIAGNOSIS — F3289 Other specified depressive episodes: Secondary | ICD-10-CM | POA: Insufficient documentation

## 2013-11-03 DIAGNOSIS — Z79899 Other long term (current) drug therapy: Secondary | ICD-10-CM | POA: Insufficient documentation

## 2013-11-03 DIAGNOSIS — R0602 Shortness of breath: Secondary | ICD-10-CM

## 2013-11-03 DIAGNOSIS — R0789 Other chest pain: Secondary | ICD-10-CM | POA: Insufficient documentation

## 2013-11-03 DIAGNOSIS — I10 Essential (primary) hypertension: Secondary | ICD-10-CM | POA: Insufficient documentation

## 2013-11-03 DIAGNOSIS — F17200 Nicotine dependence, unspecified, uncomplicated: Secondary | ICD-10-CM | POA: Insufficient documentation

## 2013-11-03 DIAGNOSIS — Z9089 Acquired absence of other organs: Secondary | ICD-10-CM | POA: Insufficient documentation

## 2013-11-03 DIAGNOSIS — E079 Disorder of thyroid, unspecified: Secondary | ICD-10-CM | POA: Insufficient documentation

## 2013-11-03 DIAGNOSIS — Z7982 Long term (current) use of aspirin: Secondary | ICD-10-CM | POA: Insufficient documentation

## 2013-11-03 DIAGNOSIS — E785 Hyperlipidemia, unspecified: Secondary | ICD-10-CM | POA: Insufficient documentation

## 2013-11-03 DIAGNOSIS — R110 Nausea: Secondary | ICD-10-CM | POA: Insufficient documentation

## 2013-11-03 DIAGNOSIS — F329 Major depressive disorder, single episode, unspecified: Secondary | ICD-10-CM | POA: Insufficient documentation

## 2013-11-03 DIAGNOSIS — M549 Dorsalgia, unspecified: Secondary | ICD-10-CM

## 2013-11-03 LAB — CBC
HCT: 47 % — ABNORMAL HIGH (ref 36.0–46.0)
Hemoglobin: 16.6 g/dL — ABNORMAL HIGH (ref 12.0–15.0)
MCH: 30.2 pg (ref 26.0–34.0)
MCHC: 35.3 g/dL (ref 30.0–36.0)
MCV: 85.6 fL (ref 78.0–100.0)
Platelets: 250 10*3/uL (ref 150–400)
RBC: 5.49 MIL/uL — ABNORMAL HIGH (ref 3.87–5.11)
RDW: 12.7 % (ref 11.5–15.5)
WBC: 11.5 10*3/uL — ABNORMAL HIGH (ref 4.0–10.5)

## 2013-11-03 LAB — BASIC METABOLIC PANEL
BUN: 5 mg/dL — ABNORMAL LOW (ref 6–23)
CO2: 28 mEq/L (ref 19–32)
Calcium: 9.8 mg/dL (ref 8.4–10.5)
Chloride: 100 mEq/L (ref 96–112)
Creatinine, Ser: 0.75 mg/dL (ref 0.50–1.10)
GFR calc Af Amer: >90 mL/min (ref >90)
GFR calc non Af Amer: >90 mL/min (ref >90)
Glucose, Bld: 137 mg/dL — ABNORMAL HIGH (ref 70–99)
Potassium: 3.2 mEq/L — ABNORMAL LOW (ref 3.5–5.1)
Sodium: 141 mEq/L (ref 135–145)

## 2013-11-03 LAB — POCT I-STAT TROPONIN I: Troponin i, poc: 0.01 ng/mL (ref 0.00–0.08)

## 2013-11-03 MED ORDER — POTASSIUM CHLORIDE CRYS ER 20 MEQ PO TBCR
40.0000 meq | EXTENDED_RELEASE_TABLET | Freq: Once | ORAL | Status: AC
  Administered 2013-11-03: 40 meq via ORAL
  Filled 2013-11-03: qty 2

## 2013-11-03 NOTE — ED Provider Notes (Signed)
CSN: [id number]     Arrival date & time 11/03/13  1326 History   First MD Initiated Contact with Patient 11/03/13 1351     Chief Complaint  Patient presents with  . Shortness of Breath  . Back Pain   (Consider location/radiation/quality/duration/timing/severity/associated sxs/prior Treatment) HPI Pt is a 57yo female with hx of anxiety, depression, and HTN c/o left sided upper back pain associated with SOB that started earlier today after receiving a "stressful" phone call this morning. Pt states she has been under a lot of stress over the last month due to family issues. States she has been very tearful all month but today, she developed new onset left upper back pain that is constant, aching and sore, described as a "catch" worse with movement and deep breaths, 8/10 at worst. Pt states she believes it is due to her anxiety and stress, however, she called a family friend who is a Engineer, civil (consulting), directed her to the ED.  SOB has improved some since arrival to ED. Denies chest pain, diaphoresis, nausea, or abdominal pain. Does state she took 81mg  Aspirin PTA.  Pt use to be on Wellutrin and Cymbalta but has been over for 1 year due to concern for dependency and side affects. Pt states she does have Xanax that she can take 1/2 a pill but tries not to until she really needs it. Has not taken any PTA.  Denies hx of known CAD, no hx of PE.    Past Medical History  Diagnosis Date  . Depression   . Anxiety   . Hypertension   . Hyperlipidemia   . Thyroid disease    Past Surgical History  Procedure Laterality Date  . Cholecystectomy    . Tubal ligation    . Colon surgery      diverticulitis with perforation; s/p colon resection.  . Colonoscopy     Family History  Problem Relation Age of Onset  . Cancer Mother     unknown primary  . Hyperlipidemia Mother    History  Substance Use Topics  . Smoking status: Current Every Day Smoker -- 1.00 packs/day for 38 years    Types: Cigarettes  . Smokeless  tobacco: Not on file  . Alcohol Use: No   OB History   Grav Para Term Preterm Abortions TAB SAB Ect Mult Living                 Review of Systems  Constitutional: Negative for fever, chills and diaphoresis.  Respiratory: Positive for chest tightness and shortness of breath. Negative for cough and stridor.   Cardiovascular: Negative for chest pain, palpitations and leg swelling.  Gastrointestinal: Positive for nausea. Negative for vomiting.  Psychiatric/Behavioral: The patient is nervous/anxious.   All other systems reviewed and are negative.    Allergies  Review of patient's allergies indicates no known allergies.  Home Medications   Current Outpatient Rx  Name  Route  Sig  Dispense  Refill  . ALPRAZolam (XANAX) 1 MG tablet   Oral   Take 1 mg by mouth daily as needed.         Marland Kitchen amLODipine (NORVASC) 5 MG tablet   Oral   Take 1 tablet (5 mg total) by mouth daily. Needs office visit (final notice)   30 tablet   5   . aspirin 81 MG tablet   Oral   Take 81 mg by mouth daily.         . naproxen (NAPROSYN) 500 MG tablet  Oral   Take 1 tablet (500 mg total) by mouth 2 (two) times daily with a meal.   30 tablet   0   . pravastatin (PRAVACHOL) 40 MG tablet   Oral   Take 1 tablet (40 mg total) by mouth daily.   30 tablet   5   . SYNTHROID 125 MCG tablet   Oral   Take 1 tablet (125 mcg total) by mouth daily before breakfast.   30 tablet   11     Dispense as written.   . Vitamin D, Ergocalciferol, (DRISDOL) 50000 UNITS CAPS   Oral   Take 1 capsule (50,000 Units total) by mouth every 7 (seven) days.   30 capsule   1    BP 146/76  Pulse 87  Temp(Src) 98.2 F (36.8 C) (Oral)  Resp 19  SpO2 95% Physical Exam  Nursing note and vitals reviewed. Constitutional: She appears well-developed and well-nourished. No distress.  Pt sitting in exam bed, appears anxious and tearful  HENT:  Head: Normocephalic and atraumatic.  Eyes: Conjunctivae are normal. No  scleral icterus.  Neck: Normal range of motion. Neck supple.  Cardiovascular: Normal rate, regular rhythm and normal heart sounds.   Pulmonary/Chest: Effort normal and breath sounds normal. No respiratory distress. She has no wheezes. She has no rales. She exhibits no tenderness.  Abdominal: Soft. Bowel sounds are normal. She exhibits no distension and no mass. There is no tenderness. There is no rebound and no guarding.  Musculoskeletal: Normal range of motion. She exhibits tenderness. She exhibits no edema.  Tednerness in thoracic musculature, no spinal tenderness.   Neurological: She is alert.  Skin: Skin is warm and dry. She is not diaphoretic.  No ecchymosis, erythema or lesions  Psychiatric: Her speech is normal and behavior is normal. Her mood appears anxious. She expresses no homicidal and no suicidal ideation. She expresses no suicidal plans and no homicidal plans.    ED Course  Procedures (including critical care time) Labs Review Labs Reviewed  CBC - Abnormal; Notable for the following:    WBC 11.5 (*)    RBC 5.49 (*)    Hemoglobin 16.6 (*)    HCT 47.0 (*)    All other components within normal limits  BASIC METABOLIC PANEL - Abnormal; Notable for the following:    Potassium 3.2 (*)    Glucose, Bld 137 (*)    BUN 5 (*)    All other components within normal limits  POCT I-STAT TROPONIN I   Imaging Review Dg Chest 2 View  11/03/2013   CLINICAL DATA:  Shortness of Breath  EXAM: CHEST  2 VIEW  COMPARISON:  August 13, 2006  FINDINGS: There is no edema or consolidation. Heart size and pulmonary vascularity are normal. No adenopathy. No bone lesions. .  IMPRESSION: No abnormality noted.   Electronically Signed   By: Bretta Bang M.D.   On: 11/03/2013 15:10    EKG Interpretation   None       Date: 11/03/2013  Rate: 97  Rhythm: normal sinus rhythm  QRS Axis: left  Intervals: normal  ST/T Wave abnormalities: normal  Conduction Disutrbances:left ventricular  hypertrophy with repolarization abnormality  Narrative Interpretation: abnormal EKG  Old EKG Reviewed: changes noted: possible left atrial enlargement, otherwise unchanged.    MDM   1. Anxiety   2. Back pain   3. SOB (shortness of breath)    Pt with hx of anxiety and depression who reports being under a lot of  stress lately c/o left upper back pain and SOB.  Pt is tearful and appears anxious. Denies hx of CAD or blood clots.  Back pain is reproducible with palpation and certain movements.  Pt denies chest pain or diaphoresis.  Denies hx of blood clots.  Reports SOB has improved some upon arrival to ED.  Labs: troponin and CBC: unremarkable. BMP-hypokalemica, K-3.2  EKG: possible left atrial enlargement, otherwise, unremarkable.   Offered pt ativan in ED, pt declined stating she has Xanax at home, she just tries to avoid taking it. States she will take 1/2 a pill when she gets home.    All labs/imaging/findings discussed with patient. All questions answered and concerns addressed. Will discharge pt home and have pt f/u with her PCP, Dr. Perrin Maltese.  Return precautions given. Pt verbalized understanding and agreement with tx plan. Vitals: unremarkable. Discharged in stable condition.    Discussed pt with attending during ED encounter and agrees with plan.     Junius Finner, PA-C 11/03/13 1627

## 2013-11-03 NOTE — ED Notes (Signed)
Pt reports having an upsetting phone call then afterwards she started to have a sharp pain in her upper back, she called the nurse and was told to go get checked out at the ED. Denies cardiac hx, but has had a stress test and does take 81 mg of ASA d/t family hx. Takes BP meds. Denies nausea, but has had some when she gets very upset. Reports she has been under a lot of stress lately. Reports she used to take Wellbutrin and Cymbalta but hasn't taken that in a year. Denies pain at this moment but sts the pain increases with a deep breath and movement. Nad, skin warm and dry, resp e/u. Pt crying with when talking about certain things that have been stressing her out.

## 2013-11-03 NOTE — ED Notes (Signed)
Per pt sts since left upper back pain, SOB since this am. sts she is very stressed and pt crying at triage.

## 2013-11-03 NOTE — ED Notes (Signed)
Pt arguing w/ family in room

## 2013-11-03 NOTE — ED Provider Notes (Signed)
Medical screening examination/treatment/procedure(s) were performed by non-physician practitioner and as supervising physician I was immediately available for consultation/collaboration.  EKG Interpretation   None         Shelda Jakes, MD 11/03/13 [phone]

## 2013-11-21 ENCOUNTER — Ambulatory Visit: Payer: 59 | Admitting: Family Medicine

## 2013-12-01 ENCOUNTER — Other Ambulatory Visit: Payer: Self-pay | Admitting: Family Medicine

## 2013-12-01 ENCOUNTER — Other Ambulatory Visit: Payer: Self-pay | Admitting: Emergency Medicine

## 2013-12-11 ENCOUNTER — Ambulatory Visit: Payer: 59 | Admitting: Family Medicine

## 2013-12-19 ENCOUNTER — Ambulatory Visit: Payer: 59 | Admitting: Family Medicine

## 2013-12-25 ENCOUNTER — Ambulatory Visit: Payer: 59 | Admitting: Family Medicine

## 2014-01-04 ENCOUNTER — Telehealth: Payer: Self-pay

## 2014-01-04 MED ORDER — AMLODIPINE BESYLATE 5 MG PO TABS: 5.0000 mg | ORAL_TABLET | Freq: Every day | ORAL | Status: DC

## 2014-01-04 NOTE — Telephone Encounter (Signed)
Dr. Katrinka Blazing:   Patient called, out of blood pressure medicine and cannot make it in until Saturday the 7th to see you.  She had to cancel her last appt. Due to her granddaughter being hospitalized.  She [facility] to know [facility] [address] and [facility] her for 2-3 blood pressure pills to hold her over until Saturday because she is having issues with there blood pressure lately.  Please call her at [phone].  I suggested the patient ask the pharmacy for a 2 day supply and she said she did ask but they said our office would have to approve that.

## 2014-01-04 NOTE — Telephone Encounter (Signed)
Sent in #5 tabs for pt and notified her. She will RTC Sat.

## 2014-01-10 ENCOUNTER — Ambulatory Visit (INDEPENDENT_AMBULATORY_CARE_PROVIDER_SITE_OTHER): Payer: 59 | Admitting: Family Medicine

## 2014-01-10 VITALS — BP 164/80 | HR 81 | Temp 98.0°F | Resp 16 | Ht 60.0 in | Wt 164.0 lb

## 2014-01-10 DIAGNOSIS — Z23 Encounter for immunization: Secondary | ICD-10-CM

## 2014-01-10 DIAGNOSIS — I10 Essential (primary) hypertension: Secondary | ICD-10-CM

## 2014-01-10 DIAGNOSIS — E039 Hypothyroidism, unspecified: Secondary | ICD-10-CM | POA: Insufficient documentation

## 2014-01-10 DIAGNOSIS — E1169 Type 2 diabetes mellitus with other specified complication: Secondary | ICD-10-CM | POA: Insufficient documentation

## 2014-01-10 DIAGNOSIS — Z1239 Encounter for other screening for malignant neoplasm of breast: Secondary | ICD-10-CM

## 2014-01-10 DIAGNOSIS — F411 Generalized anxiety disorder: Secondary | ICD-10-CM | POA: Insufficient documentation

## 2014-01-10 DIAGNOSIS — E559 Vitamin D deficiency, unspecified: Secondary | ICD-10-CM

## 2014-01-10 DIAGNOSIS — E7800 Pure hypercholesterolemia, unspecified: Secondary | ICD-10-CM | POA: Insufficient documentation

## 2014-01-10 LAB — CBC WITH DIFFERENTIAL/PLATELET
Basophils Absolute: 0.1 10*3/uL (ref 0.0–0.1)
Basophils Relative: 1 % (ref 0–1)
Eosinophils Absolute: 0.3 10*3/uL (ref 0.0–0.7)
Eosinophils Relative: 3 % (ref 0–5)
HCT: 45 % (ref 36.0–46.0)
Hemoglobin: 15.8 g/dL — ABNORMAL HIGH (ref 12.0–15.0)
Lymphocytes Relative: 46 % (ref 12–46)
Lymphs Abs: 4.4 10*3/uL — ABNORMAL HIGH (ref 0.7–4.0)
MCH: 29.5 pg (ref 26.0–34.0)
MCHC: 35.1 g/dL (ref 30.0–36.0)
MCV: 84 fL (ref 78.0–100.0)
Monocytes Absolute: 0.7 10*3/uL (ref 0.1–1.0)
Monocytes Relative: 7 % (ref 3–12)
Neutro Abs: 4.1 10*3/uL (ref 1.7–7.7)
Neutrophils Relative %: 43 % (ref 43–77)
Platelets: 267 10*3/uL (ref 150–400)
RBC: 5.36 MIL/uL — ABNORMAL HIGH (ref 3.87–5.11)
RDW: 13.7 % (ref 11.5–15.5)
WBC: 9.6 10*3/uL (ref 4.0–10.5)

## 2014-01-10 LAB — COMPLETE METABOLIC PANEL WITH GFR
ALT: 18 U/L (ref 0–35)
AST: 23 U/L (ref 0–37)
Albumin: 4.2 g/dL (ref 3.5–5.2)
Alkaline Phosphatase: 82 U/L (ref 39–117)
BUN: 4 mg/dL — ABNORMAL LOW (ref 6–23)
CO2: 27 mEq/L (ref 19–32)
Calcium: 9.4 mg/dL (ref 8.4–10.5)
Chloride: 103 mEq/L (ref 96–112)
Creat: 0.66 mg/dL (ref 0.50–1.10)
GFR, Est African American: >89 mL/min
GFR, Est Non African American: >89 mL/min
Glucose, Bld: 92 mg/dL (ref 70–99)
Potassium: 3.4 mEq/L — ABNORMAL LOW (ref 3.5–5.3)
Sodium: 141 mEq/L (ref 135–145)
Total Bilirubin: 0.4 mg/dL (ref 0.2–1.2)
Total Protein: 7.8 g/dL (ref 6.0–8.3)

## 2014-01-10 LAB — LIPID PANEL
Cholesterol: 211 mg/dL — ABNORMAL HIGH (ref 0–200)
HDL: 32 mg/dL — ABNORMAL LOW (ref >39)
LDL Cholesterol: 128 mg/dL — ABNORMAL HIGH (ref 0–99)
Total CHOL/HDL Ratio: 6.6 Ratio
Triglycerides: 253 mg/dL — ABNORMAL HIGH (ref <150)
VLDL: 51 mg/dL — ABNORMAL HIGH (ref 0–40)

## 2014-01-10 LAB — TSH: TSH: 1.454 u[IU]/mL (ref 0.350–4.500)

## 2014-01-10 LAB — T4, FREE: Free T4: 0.82 ng/dL (ref 0.80–1.80)

## 2014-01-10 LAB — HEMOGLOBIN A1C
Hgb A1c MFr Bld: 5.6 % (ref <5.7)
Mean Plasma Glucose: 114 mg/dL (ref <117)

## 2014-01-10 MED ORDER — SYNTHROID 125 MCG PO TABS: 125.0000 ug | ORAL_TABLET | Freq: Every day | ORAL | Status: DC

## 2014-01-10 MED ORDER — PRAVASTATIN SODIUM 40 MG PO TABS: 40.0000 mg | ORAL_TABLET | Freq: Every day | ORAL | Status: DC

## 2014-01-10 MED ORDER — VITAMIN D (ERGOCALCIFEROL) 1.25 MG (50000 UNIT) PO CAPS: 50000.0000 [IU] | ORAL_CAPSULE | ORAL | Status: DC

## 2014-01-10 MED ORDER — AMLODIPINE BESYLATE 5 MG PO TABS: 5.0000 mg | ORAL_TABLET | Freq: Every day | ORAL | Status: DC

## 2014-01-10 NOTE — Progress Notes (Addendum)
Subjective:  This chart was scribed for Ethelda ChickKristi M Charina Fons, MD by Leone PayorSonum Patel, ED Scribe. This patient was seen in room 12 and the patient's care was started 10:30 AM.    Patient ID: Felicia Mediniane G Sellin, female    DOB: [birth year], 58 y.o.   MRN: [id number]  HPI  HPI Comments: Felicia MedinDiane G Hanson is a 58 y.o. female who [facility] for an 8 month follow up for HTN, HLD, and hypothyroidism. Pt states she has not been checking her blood pressure regularly. She reports running out of these medications and states she has not taken her HTN medication today.  Recent ED visit for chest pain.    She reports increased stress since Christmas 2014 and increased Xanax use (every other day). She reports her grand daughter attempted suicide recently. She also takes care of her other young grandchildren and generally has stress when dealing with her children. Pt went to the ED a few months ago because she was concerned about having a heart attack. She was discharged home with a diagnosis of anxiety.   Pt states she has not taken her anti-depressant medications (Cymbalta, Wellbutrin) for the past 1 year. She is not exercising regularly.   [place]   Past Medical History  Diagnosis Date  . Depression   . Anxiety   . Hypertension   . Hyperlipidemia   . Thyroid disease     Past Surgical History  Procedure Laterality Date  . Cholecystectomy    . Tubal ligation    . Colon surgery      diverticulitis with perforation; s/p colon resection.  . Colonoscopy      No Known Allergies  Prior to Admission medications   Medication Sig Start Date End Date Taking? Authorizing Provider  ALPRAZolam Prudy Feeler(XANAX) 1 MG tablet Take 1 mg by mouth daily as needed. 08/21/13  Yes Historical Provider, MD  amLODipine (NORVASC) 5 MG tablet Take 1 tablet (5 mg total) by mouth daily. PATIENT NEEDS OFFICE VISIT FOR ADDITIONAL REFILLS 01/04/14  Yes Ethelda ChickKristi M Royale Swamy, MD  aspirin 81 MG tablet Take 81 mg by mouth daily.   Yes  Historical Provider, MD  pravastatin (PRAVACHOL) 40 MG tablet Take 1 tablet (40 mg total) by mouth daily. 05/03/13 05/03/14 Yes Ethelda ChickKristi M Anika Shore, MD  SYNTHROID 125 MCG tablet Take 1 tablet (125 mcg total) by mouth daily before breakfast. 05/03/13  Yes Ethelda ChickKristi M Caisley Baxendale, MD  Vitamin D, Ergocalciferol, (DRISDOL) 50000 UNITS CAPS capsule Take 1 capsule (50,000 Units total) by mouth every 7 (seven) days. PATIENT NEEDS OFFICE VISIT/LABS FOR ADDITIONAL REFILLS 12/01/13  Yes Phillips OdorJeffery Anderson, MD  naproxen (NAPROSYN) 500 MG tablet Take 1 tablet (500 mg total) by mouth 2 (two) times daily with a meal. 05/03/13   Ethelda ChickKristi M Jeanice Dempsey, MD       Review of Systems  Constitutional: Negative for fever, chills, diaphoresis and fatigue.  Eyes: Negative for visual disturbance.  Respiratory: Negative for cough and shortness of breath.   Cardiovascular: Negative for chest pain, palpitations and leg swelling.  Gastrointestinal: Negative for nausea, vomiting, abdominal pain, diarrhea and constipation.  Endocrine: Negative for cold intolerance, heat intolerance, polydipsia, polyphagia and polyuria.  Neurological: Negative for dizziness, tremors, seizures, syncope, facial asymmetry, speech difficulty, weakness, light-headedness, numbness and headaches.  Psychiatric/Behavioral: Positive for dysphoric mood. The patient is nervous/anxious.        Objective:   Physical Exam  Constitutional: She is oriented to person, place, and time. She appears well-developed and  well-nourished. No distress.  HENT:  Head: Normocephalic and atraumatic.  Right Ear: External ear normal.  Left Ear: External ear normal.  Nose: Nose normal.  Mouth/Throat: Oropharynx is clear and moist.  Eyes: Conjunctivae and EOM are normal. Pupils are equal, round, and reactive to light.  Neck: Normal range of motion. Neck supple. Carotid bruit is not present. No thyromegaly present.  Cardiovascular: Normal rate, regular rhythm, normal heart sounds and intact distal  pulses.  Exam reveals no gallop and no friction rub.   No murmur heard. Blood pressure: 164/82, 140 systolic with larger cuff.   Pulmonary/Chest: Effort normal and breath sounds normal. She has no wheezes. She has no rales.  Abdominal: Soft. Bowel sounds are normal. She exhibits no distension and no mass. There is no tenderness. There is no rebound and no guarding. A hernia is present.  Midline vertical hernia at an incision site.   Lymphadenopathy:    She has no cervical adenopathy.  Neurological: She is alert and oriented to person, place, and time. No cranial nerve deficit.  Skin: Skin is warm and dry. No rash noted. She is not diaphoretic. No erythema. No pallor.  Psychiatric: She has a normal mood and affect. Her behavior is normal.  Nursing note and vitals reviewed.  TDAP AND INFLUENZA VACCINES ADMINISTERED BY [facility].    Assessment & Plan:   1. Generalized anxiety disorder   2. Unspecified vitamin D deficiency   3. Essential hypertension, benign   4. Pure hypercholesterolemia   5. Unspecified hypothyroidism   6. Need for prophylactic vaccination with combined diphtheria-tetanus-pertussis (DTP) vaccine   7. Need for prophylactic vaccination and inoculation against influenza   8. Breast cancer screening     1. Generalized anxiety disorder: stable despite worsening stressors over past year.  Non-compliance with medications other than Xanax; now using Xanax qod due to stressors.   2.  Vitamin D deficiency: stable; obtain labs; refill provided. 3.  HTN: uncontrolled due to non-compliance with medications recently; obtain labs; refills provided. 4.  Hypercholesterolemia: uncontrolled due to non-compliance with medications recently; obtain labs; refill of Pravastatin provided. 5.  Hypothyroidism: stable; refill provided; obtain labs. 6. S/p TDAP and influenza vaccines. 7. Breast cancer screening: refer for mammogram.   Meds ordered this encounter  Medications  . DISCONTD:  amLODipine (NORVASC) 5 MG tablet    Sig: Take 1 tablet (5 mg total) by mouth daily.    Dispense:  30 tablet    Refill:  5  . DISCONTD: pravastatin (PRAVACHOL) 40 MG tablet    Sig: Take 1 tablet (40 mg total) by mouth daily.    Dispense:  30 tablet    Refill:  5  . DISCONTD: SYNTHROID 125 MCG tablet    Sig: Take 1 tablet (125 mcg total) by mouth daily before breakfast.    Dispense:  30 tablet    Refill:  11  . Vitamin D, Ergocalciferol, (DRISDOL) 50000 UNITS CAPS capsule    Sig: Take 1 capsule (50,000 Units total) by mouth every 7 (seven) days.    Dispense:  4 capsule    Refill:  5    I personally performed the services described in this documentation, which was scribed in my presence.  The recorded information has been reviewed and is accurate.  Nilda Simmer, M.D.  [facility] [address] [place], [place]  [address] [phone] phone [phone] fax

## 2014-01-11 LAB — VITAMIN D 25 HYDROXY (VIT D DEFICIENCY, FRACTURES): Vit D, 25-Hydroxy: 42 ng/mL (ref 30–89)

## 2014-02-02 ENCOUNTER — Other Ambulatory Visit: Payer: Self-pay | Admitting: Family Medicine

## 2014-03-07 ENCOUNTER — Other Ambulatory Visit: Payer: Self-pay

## 2014-03-07 DIAGNOSIS — E7800 Pure hypercholesterolemia, unspecified: Secondary | ICD-10-CM

## 2014-03-07 DIAGNOSIS — E039 Hypothyroidism, unspecified: Secondary | ICD-10-CM

## 2014-03-07 MED ORDER — PRAVASTATIN SODIUM 40 MG PO TABS: 40.0000 mg | ORAL_TABLET | Freq: Every day | ORAL | Status: DC

## 2014-03-07 MED ORDER — SYNTHROID 125 MCG PO TABS: 125.0000 ug | ORAL_TABLET | Freq: Every day | ORAL | Status: DC

## 2014-03-07 MED ORDER — AMLODIPINE BESYLATE 5 MG PO TABS: 5.0000 mg | ORAL_TABLET | Freq: Every day | ORAL | Status: DC

## 2014-08-10 ENCOUNTER — Other Ambulatory Visit: Payer: Self-pay | Admitting: Family Medicine

## 2014-09-16 ENCOUNTER — Other Ambulatory Visit: Payer: Self-pay | Admitting: Physician Assistant

## 2014-09-18 ENCOUNTER — Telehealth: Payer: Self-pay

## 2014-09-18 NOTE — Telephone Encounter (Signed)
Refill has been sent to the pharmacy.  

## 2014-09-18 NOTE — Telephone Encounter (Signed)
Pt has an appt with dr Katrinka Blazing on 09/26/14, but is completely out of her BP RX and Thyroid RX. Pt is requesting 1 week of meds until she can see her next week

## 2014-09-19 ENCOUNTER — Other Ambulatory Visit: Payer: Self-pay | Admitting: Family Medicine

## 2014-09-26 ENCOUNTER — Ambulatory Visit: Payer: 59 | Admitting: Family Medicine

## 2014-10-08 ENCOUNTER — Ambulatory Visit: Payer: 59 | Admitting: Family Medicine

## 2014-10-16 ENCOUNTER — Other Ambulatory Visit: Payer: Self-pay | Admitting: Physician Assistant

## 2014-10-16 ENCOUNTER — Other Ambulatory Visit: Payer: Self-pay | Admitting: Family Medicine

## 2014-10-17 ENCOUNTER — Telehealth: Payer: Self-pay

## 2014-10-17 NOTE — Telephone Encounter (Signed)
Pt needs a refill of her blood pressure & thyroid medication until her appt. With Dr. Katrinka Blazing On 11/23. She will run out of her medication tomorrow. [facility] [address]. Please call pt @ [phone] if/when medication is called in.

## 2014-10-17 NOTE — Telephone Encounter (Signed)
Notified pt scripts were renewed 11/18. Check with pharmacy and call us back if any concerns.

## 2014-10-19 ENCOUNTER — Telehealth: Payer: Self-pay

## 2014-10-19 MED ORDER — AMLODIPINE BESYLATE 5 MG PO TABS: ORAL_TABLET | ORAL | Status: DC

## 2014-10-19 NOTE — Telephone Encounter (Signed)
Patient would like to know if she could have a 90 day supply of the medicine that Dr Katrinka Blazing called in for her at the pharmacy.  Patient states that she is on the way to the pharmacy to pick-up her medicine.   Best#: [phone]  [facility] [address]

## 2014-10-19 NOTE — Telephone Encounter (Signed)
Pt has an appt 11/23- sent in #90 for her insurance to cover medication

## 2014-10-22 ENCOUNTER — Ambulatory Visit: Payer: 59 | Admitting: Family Medicine

## 2014-10-30 ENCOUNTER — Other Ambulatory Visit: Payer: Self-pay | Admitting: Family Medicine

## 2014-11-14 ENCOUNTER — Ambulatory Visit (INDEPENDENT_AMBULATORY_CARE_PROVIDER_SITE_OTHER): Payer: Self-pay | Admitting: Family Medicine

## 2014-11-14 ENCOUNTER — Encounter: Payer: Self-pay | Admitting: Family Medicine

## 2014-11-14 VITALS — BP 140/70 | HR 102 | Temp 98.1°F | Resp 16 | Ht 59.75 in | Wt 163.2 lb

## 2014-11-14 DIAGNOSIS — K130 Diseases of lips: Secondary | ICD-10-CM

## 2014-11-14 DIAGNOSIS — Z23 Encounter for immunization: Secondary | ICD-10-CM

## 2014-11-14 DIAGNOSIS — I10 Essential (primary) hypertension: Secondary | ICD-10-CM

## 2014-11-14 DIAGNOSIS — E039 Hypothyroidism, unspecified: Secondary | ICD-10-CM

## 2014-11-14 DIAGNOSIS — Z8639 Personal history of other endocrine, nutritional and metabolic disease: Secondary | ICD-10-CM

## 2014-11-14 DIAGNOSIS — E7800 Pure hypercholesterolemia, unspecified: Secondary | ICD-10-CM

## 2014-11-14 MED ORDER — AMLODIPINE BESYLATE 5 MG PO TABS: ORAL_TABLET | ORAL | Status: DC

## 2014-11-14 MED ORDER — SYNTHROID 125 MCG PO TABS: 125.0000 ug | ORAL_TABLET | Freq: Every day | ORAL | Status: DC

## 2014-11-14 MED ORDER — MICONAZOLE NITRATE 2 % EX OINT: TOPICAL_OINTMENT | CUTANEOUS | Status: DC

## 2014-11-14 NOTE — Progress Notes (Signed)
IDENTIFYING INFORMATION  Felicia MedinDiane G Hanson / DOB: Jan 15, 1956 / MRN: [id number]  The patient has Generalized anxiety disorder; Unspecified vitamin D deficiency; Essential hypertension, benign; Pure hypercholesterolemia; and Unspecified hypothyroidism on her problem list.  SUBJECTIVE  CC: Medication Refill   HPI: Felicia Hanson is a 58 y.o. y.o. female presenting for medication refills and rash at the corner of her lips.  She would like her vitamin D, Synthroid, and amlodipine refilled today.  She denies changes in hair texture and skin texture today, and denies changes in bowel or bladder.  She denies chest pain and SOB.   She admits to having some mild abdominal pain last night, but feels better at the present.  She will seek care at 102 should her symptoms warrant.    She complains of bilateral rash on the corners of her lips.  She has been applying ointment to this and says it gets better for a while, only to worsen later.  She has never tried a fungal cream for this.     She  has a past medical history of Depression; Anxiety; Hypertension; Hyperlipidemia; and Thyroid disease.    She has a current medication list which includes the following prescription(s): alprazolam, amlodipine, aspirin, pravastatin, synthroid, vitamin d (ergocalciferol), and miconazole nitrate.  Felicia Hanson has No Known Allergies. She  reports that she has been smoking Cigarettes.  She has a 38 pack-year smoking history. She does not have any smokeless tobacco history on file. She reports that she does not drink alcohol or use illicit drugs. She  reports that she currently engages in sexual activity.  The patient  has past surgical history that includes Cholecystectomy; Tubal ligation; Colon surgery; and Colonoscopy.  Her family history includes Cancer in her mother; Hyperlipidemia in her mother.  Review of Systems  Constitutional: Negative for fever and chills.  HENT: Negative for congestion.   Eyes: Negative.     Respiratory: Negative.  Negative for cough.   Cardiovascular: Negative.  Negative for chest pain.  Gastrointestinal: Negative.   Genitourinary: Negative.   Musculoskeletal: Negative.   Skin: Positive for rash.  Neurological: Negative.  Negative for headaches.    OBJECTIVE  Blood pressure 140/70, pulse 102, temperature 98.1 F (36.7 C), temperature source Oral, resp. rate 16, height 4' 11.75" (1.518 m), weight 163 lb 3.2 oz (74.027 kg), SpO2 97 %. The patient's body mass index is 32.13 kg/(m^2).  Physical Exam  Constitutional: She is oriented to person, place, and time. She appears well-developed and well-nourished. No distress.  HENT:  Head: Normocephalic.  Cardiovascular: Regular rhythm, normal heart sounds and intact distal pulses.   Respiratory: Effort normal and breath sounds normal.  GI: There is no tenderness.  Musculoskeletal: Normal range of motion.  Neurological: She is alert and oriented to person, place, and time. No cranial nerve deficit.  Skin: Skin is warm and dry. She is not diaphoretic.  Psychiatric: She has a normal mood and affect. Her behavior is normal. Judgment and thought content normal.    No results found for this or any previous visit (from the past 24 hour(s)).  ASSESSMENT & PLAN  Felicia Hanson was seen today for medication refill.  Diagnoses and associated orders for this visit:  Need for prophylactic vaccination and inoculation against influenza - Flu Vaccine QUAD 36+ mos IM  Hypothyroidism, unspecified hypothyroidism type - SYNTHROID 125 MCG tablet; Take 1 tablet (125 mcg total) by mouth daily before breakfast. - Cancel: TSH; Future - CBC with Differential - T4, free -  TSH  Essential hypertension - amLODipine (NORVASC) 5 MG tablet; TAKE 1 TABLET (5 MG TOTAL) BY MOUTH DAILY. - Comprehensive metabolic panel  History of vitamin D deficiency - Cancel: Vitamin D, 25-hydroxy; Future - Vit D  25 hydroxy (rtn osteoporosis monitoring)  Angular  cheilitis - Miconazole Nitrate 2 % OINT; Apply to affected area twice daily for 7-14 days. (Pharmacy called and unable to fill this as it is written.  They recommend OTC clotrimazole/betamethasone cream and patient is amenable to this.    Other Orders - Cancel: Vitamin D, Ergocalciferol, (DRISDOL) 50000 UNITS CAPS capsule;      The patient was instructed to to call or comeback to clinic as needed, or should symptoms warrant.  Deliah Boston, MHS, PA-C [facility] 11/14/2014 1:10 PM

## 2014-11-15 LAB — COMPREHENSIVE METABOLIC PANEL
ALT: 14 U/L (ref 0–35)
AST: 20 U/L (ref 0–37)
Albumin: 4.3 g/dL (ref 3.5–5.2)
Alkaline Phosphatase: 80 U/L (ref 39–117)
BUN: 8 mg/dL (ref 6–23)
CO2: 31 mEq/L (ref 19–32)
Calcium: 10.2 mg/dL (ref 8.4–10.5)
Chloride: 101 mEq/L (ref 96–112)
Creat: 0.92 mg/dL (ref 0.50–1.10)
Glucose, Bld: 83 mg/dL (ref 70–99)
Potassium: 4.1 mEq/L (ref 3.5–5.3)
Sodium: 139 mEq/L (ref 135–145)
Total Bilirubin: 0.5 mg/dL (ref 0.2–1.2)
Total Protein: 8.1 g/dL (ref 6.0–8.3)

## 2014-11-15 LAB — CBC WITH DIFFERENTIAL/PLATELET
Basophils Absolute: 0.1 10*3/uL (ref 0.0–0.1)
Basophils Relative: 1 % (ref 0–1)
Eosinophils Absolute: 0.2 10*3/uL (ref 0.0–0.7)
Eosinophils Relative: 2 % (ref 0–5)
HCT: 42.6 % (ref 36.0–46.0)
Hemoglobin: 15 g/dL (ref 12.0–15.0)
Lymphocytes Relative: 41 % (ref 12–46)
Lymphs Abs: 4.4 10*3/uL — ABNORMAL HIGH (ref 0.7–4.0)
MCH: 29.3 pg (ref 26.0–34.0)
MCHC: 35.2 g/dL (ref 30.0–36.0)
MCV: 83.2 fL (ref 78.0–100.0)
MPV: 11.9 fL (ref 9.4–12.4)
Monocytes Absolute: 1 10*3/uL (ref 0.1–1.0)
Monocytes Relative: 9 % (ref 3–12)
Neutro Abs: 5.1 10*3/uL (ref 1.7–7.7)
Neutrophils Relative %: 47 % (ref 43–77)
Platelets: 296 10*3/uL (ref 150–400)
RBC: 5.12 MIL/uL — ABNORMAL HIGH (ref 3.87–5.11)
RDW: 12.7 % (ref 11.5–15.5)
WBC: 10.8 10*3/uL — ABNORMAL HIGH (ref 4.0–10.5)

## 2014-11-15 LAB — T4, FREE: Free T4: 1.42 ng/dL (ref 0.80–1.80)

## 2014-11-15 LAB — TSH: TSH: 0.036 u[IU]/mL — ABNORMAL LOW (ref 0.350–4.500)

## 2014-11-15 LAB — VITAMIN D 25 HYDROXY (VIT D DEFICIENCY, FRACTURES): Vit D, 25-Hydroxy: 40 ng/mL (ref 30–100)

## 2014-11-17 ENCOUNTER — Telehealth: Payer: Self-pay | Admitting: Family Medicine

## 2014-11-17 DIAGNOSIS — R058 Other specified cough: Secondary | ICD-10-CM

## 2014-11-17 DIAGNOSIS — R05 Cough: Secondary | ICD-10-CM

## 2014-11-17 NOTE — Telephone Encounter (Signed)
Patient called stated she seen Dr.Smith on the 16th. She started coughing yesterday morning (11-16-14) Cough is worse. Symptoms are beginning to be worse. States she need some cough syrup. Patient request for [place] to call her on her  husband mobile phone [phone].

## 2014-11-19 ENCOUNTER — Other Ambulatory Visit: Payer: Self-pay | Admitting: Family Medicine

## 2014-11-19 MED ORDER — BENZONATATE 100 MG PO CAPS: 100.0000 mg | ORAL_CAPSULE | Freq: Three times a day (TID) | ORAL | Status: DC | PRN

## 2014-11-19 MED ORDER — HYDROCODONE-HOMATROPINE 5-1.5 MG/5ML PO SYRP: 5.0000 mL | ORAL_SOLUTION | Freq: Three times a day (TID) | ORAL | Status: DC | PRN

## 2014-11-19 NOTE — Telephone Encounter (Signed)
Noted and agree.  Not sick at time of visit.

## 2014-11-19 NOTE — Telephone Encounter (Signed)
Pt states that she has been coughing since her flu shot on Friday. Her cough has been productive. She is using Musinex. She does not have the money to come back in.  Her granddaughter whom lives with her was just diagnosed with bronchitis and pt is her primary care provider. Pt is asking for a cough syrup and z-pak to be called in since she was just here to see Dr. Katrinka Blazing.    [phone]

## 2014-11-19 NOTE — Telephone Encounter (Signed)
Spoke with patient.  Reports she feels well aside from cough.  Did not complain of SOB, DOE, fever, or difficulty breathing.  She reports her daughter and sister are both taking Z-pak for bronchitis.  Educated her on appropriate use of antibiotics, and if she develops any of the above symptoms that she will need to be evaluated.    Deliah Boston, MS, PA-C   10:15 AM, 11/19/2014

## 2014-11-26 ENCOUNTER — Other Ambulatory Visit: Payer: Self-pay | Admitting: Family Medicine

## 2014-11-26 MED ORDER — LEVOTHYROXINE SODIUM 100 MCG PO TABS: 100.0000 ug | ORAL_TABLET | Freq: Every day | ORAL | Status: DC

## 2014-12-17 ENCOUNTER — Telehealth: Payer: Self-pay

## 2014-12-17 NOTE — Telephone Encounter (Signed)
Patient is requesting a generic 90-day supply of synthroid.  [phone]

## 2014-12-18 MED ORDER — LEVOTHYROXINE SODIUM 100 MCG PO TABS: 100.0000 ug | ORAL_TABLET | Freq: Every day | ORAL | Status: DC

## 2014-12-18 NOTE — Telephone Encounter (Signed)
Refill sent to the pharmacy 

## 2015-02-06 ENCOUNTER — Encounter: Payer: Self-pay | Admitting: Family Medicine

## 2015-02-06 ENCOUNTER — Ambulatory Visit (INDEPENDENT_AMBULATORY_CARE_PROVIDER_SITE_OTHER): Payer: 59 | Admitting: Family Medicine

## 2015-02-06 VITALS — BP 131/78 | HR 89 | Temp 98.0°F | Resp 16 | Ht 60.0 in | Wt 166.6 lb

## 2015-02-06 DIAGNOSIS — E038 Other specified hypothyroidism: Secondary | ICD-10-CM | POA: Diagnosis not present

## 2015-02-06 DIAGNOSIS — K5901 Slow transit constipation: Secondary | ICD-10-CM | POA: Diagnosis not present

## 2015-02-06 DIAGNOSIS — R000 Tachycardia, unspecified: Secondary | ICD-10-CM | POA: Diagnosis not present

## 2015-02-06 DIAGNOSIS — R3915 Urgency of urination: Secondary | ICD-10-CM | POA: Diagnosis not present

## 2015-02-06 DIAGNOSIS — E034 Atrophy of thyroid (acquired): Secondary | ICD-10-CM

## 2015-02-06 DIAGNOSIS — R002 Palpitations: Secondary | ICD-10-CM | POA: Diagnosis not present

## 2015-02-06 DIAGNOSIS — F411 Generalized anxiety disorder: Secondary | ICD-10-CM

## 2015-02-06 LAB — CBC WITH DIFFERENTIAL/PLATELET
Basophils Absolute: 0.1 10*3/uL (ref 0.0–0.1)
Basophils Relative: 1 % (ref 0–1)
Eosinophils Absolute: 0.3 10*3/uL (ref 0.0–0.7)
Eosinophils Relative: 3 % (ref 0–5)
HCT: 43.3 % (ref 36.0–46.0)
Hemoglobin: 15.1 g/dL — ABNORMAL HIGH (ref 12.0–15.0)
Lymphocytes Relative: 44 % (ref 12–46)
Lymphs Abs: 4.9 10*3/uL — ABNORMAL HIGH (ref 0.7–4.0)
MCH: 29.3 pg (ref 26.0–34.0)
MCHC: 34.9 g/dL (ref 30.0–36.0)
MCV: 84.1 fL (ref 78.0–100.0)
MPV: 11.8 fL (ref 8.6–12.4)
Monocytes Absolute: 0.9 10*3/uL (ref 0.1–1.0)
Monocytes Relative: 8 % (ref 3–12)
Neutro Abs: 4.9 10*3/uL (ref 1.7–7.7)
Neutrophils Relative %: 44 % (ref 43–77)
Platelets: 302 10*3/uL (ref 150–400)
RBC: 5.15 MIL/uL — ABNORMAL HIGH (ref 3.87–5.11)
RDW: 13.3 % (ref 11.5–15.5)
WBC: 11.1 10*3/uL — ABNORMAL HIGH (ref 4.0–10.5)

## 2015-02-06 LAB — COMPREHENSIVE METABOLIC PANEL
ALT: 14 U/L (ref 0–35)
AST: 21 U/L (ref 0–37)
Albumin: 4.2 g/dL (ref 3.5–5.2)
Alkaline Phosphatase: 79 U/L (ref 39–117)
BUN: 7 mg/dL (ref 6–23)
CO2: 27 mEq/L (ref 19–32)
Calcium: 9.7 mg/dL (ref 8.4–10.5)
Chloride: 102 mEq/L (ref 96–112)
Creat: 0.66 mg/dL (ref 0.50–1.10)
Glucose, Bld: 71 mg/dL (ref 70–99)
Potassium: 4.1 mEq/L (ref 3.5–5.3)
Sodium: 140 mEq/L (ref 135–145)
Total Bilirubin: 0.3 mg/dL (ref 0.2–1.2)
Total Protein: 8 g/dL (ref 6.0–8.3)

## 2015-02-06 LAB — POCT URINALYSIS DIPSTICK
Bilirubin, UA: NEGATIVE
Glucose, UA: NEGATIVE
Ketones, UA: NEGATIVE
Leukocytes, UA: NEGATIVE
Nitrite, UA: NEGATIVE
Protein, UA: NEGATIVE
Spec Grav, UA: 1.01
Urobilinogen, UA: 0.2
pH, UA: 6.5

## 2015-02-06 LAB — POCT UA - MICROSCOPIC ONLY
Bacteria, U Microscopic: NEGATIVE
Casts, Ur, LPF, POC: NEGATIVE
Crystals, Ur, HPF, POC: NEGATIVE
Mucus, UA: NEGATIVE
Yeast, UA: NEGATIVE

## 2015-02-06 LAB — TSH: TSH: 0.359 u[IU]/mL (ref 0.350–4.500)

## 2015-02-06 LAB — T4, FREE: Free T4: 1.47 ng/dL (ref 0.80–1.80)

## 2015-02-06 NOTE — Patient Instructions (Signed)

## 2015-02-06 NOTE — Progress Notes (Signed)
Subjective:    Patient ID: Felicia Hanson, female    DOB: 1956/07/03, 59 y.o.   MRN: [id number]  02/06/2015  Anxiety; Tachycardia; and Blurred Vision   HPI This 59 y.o. female presents for evaluation of worsening anxiety, tachycardia, blurred vision.  Onset in past three weeks with worsening anxiety.  No worsening stressors so pt does not quite understand where anxiety is coming from.  Trying to think what has been different.  Changed dose of thyroid medication.  Now taking generic dose of Levothyroxine.  Also cut out vitamin D capsules.  Still getting Xanax 1mg ; might take 5-10 per year on average; in past three weeks, has taken 6 Xanax in past three weeks.  Taking one Xanax causes depressive symptoms and tired.  Dr. Delle Reining prescribes Xanax; sees Evelene Croon every six months.  Had a bad cold in December and evaluated by Dr. Jeannetta Nap.  Has not been taking Adderall.  Concentration has been down; train of thought has been poor lately.  Has been wanting to avoid Adderall due to palpitations.  Vision has changed; worried about cataracts. Bought Mucinex capsules in December; took one at night; Mucinex felt like it lodged in esphagus; went into panic attack; true fear of death.During panic attack, drank something which made it worse; then started coughing up clear liquid.  Went to neighbor who is nurse retired; advised to take small sips; set and talk; took BP and pulse; felt like heart rate irregular. Knew having panic attack; talked with neighbor for one hour.  Since then, very cautious when taking pills.  A couple of wekes ago, husband in one room and granddaughter in another room; working on computer; several times had this sensation of swelling in throat like a panic attack.  Had near syncopal sensation.  Things got black for a second and then symptoms improved.  Didn't mention anything to husband.Occurred 3-4 times over an hour and then got scared.  Got something to drink; mentioned symptoms to husband who did not  respond; got irritated with husband.  Has continued to occur two other times separate from this day.  Granddaughter started screaming at patient; pt felt heart rate in throat; pt started getting upset; laid down due to feeling palpitations; still felt heart in throat; took Xanax with improvement.  A lot of yelling triggers symptoms; hatefulness is difficult for patient.  Drove to [place] this weekend; while driving to [place] got lightheaded and thinks got scared.  Kept going because half way there.  Started feeling dizziness.  Called sister; asked sister to stay by pone.  Sister decided to drive patient home.  Has been really tired.  Has not taken Xanax in past three days; usually taking 1/2-1 every 72 hours on average.  Vision changes; while watching television, there will be a gray spot in someone's face.  No ophthalmologist.  Family history of AMI in grandmother at age 1.  One of maternal aunts had AMI with CABG in sixties.  Several males on mother's side with CAD.  Last stress test several years ago.    Sister in law recently had AMI; friend also had AMI; another friend recently diagnosed with heart issues.  Last OV in 10/2014; TSH over-corrected and Synthroid dose decreased to daily.  Vitamin D level normal.    Urinary urgency: onset two weeks ago; +hematuria.  +constipation.  Taking Miralax.     Review of Systems  Constitutional: Negative for fever, chills, diaphoresis and fatigue.  Eyes: Positive for visual disturbance.  Respiratory: Negative for cough and shortness of breath.   Cardiovascular: Positive for palpitations. Negative for chest pain and leg swelling.  Gastrointestinal: Negative for nausea, vomiting, abdominal pain, diarrhea and constipation.  Endocrine: Negative for cold intolerance, heat intolerance, polydipsia, polyphagia and polyuria.  Genitourinary: Positive for urgency and frequency. Negative for dysuria, hematuria, vaginal bleeding, vaginal discharge, difficulty  urinating, genital sores, vaginal pain, pelvic pain and dyspareunia.  Neurological: Negative for dizziness, tremors, seizures, syncope, facial asymmetry, speech difficulty, weakness, light-headedness, numbness and headaches.  Psychiatric/Behavioral: Positive for decreased concentration. Negative for suicidal ideas, sleep disturbance, self-injury and dysphoric mood. The patient is nervous/anxious.     Past Medical History  Diagnosis Date  . Depression   . Anxiety   . Hypertension   . Hyperlipidemia   . Thyroid disease    Past Surgical History  Procedure Laterality Date  . Cholecystectomy    . Tubal ligation    . Colon surgery      diverticulitis with perforation; s/p colon resection.  . Colonoscopy     No Known Allergies Current Outpatient Prescriptions  Medication Sig Dispense Refill  . ALPRAZolam (XANAX) 1 MG tablet Take 1 mg by mouth daily as needed.    Marland Kitchen aspirin 81 MG tablet Take 81 mg by mouth daily.    Marland Kitchen levothyroxine (SYNTHROID) 100 MCG tablet Take 1 tablet (100 mcg total) by mouth daily before breakfast. 90 tablet 1  . pravastatin (PRAVACHOL) 40 MG tablet Take 1 tablet (40 mg total) by mouth daily. PATIENT NEEDS OFFICE VISIT FOR ADDITIONAL REFILLS 30 tablet 0  . amLODipine (NORVASC) 5 MG tablet TAKE 1 TABLET (5 MG TOTAL) BY MOUTH DAILY. 90 tablet 1  . amphetamine-dextroamphetamine (ADDERALL) 20 MG tablet   0  . clotrimazole-betamethasone (LOTRISONE) cream     . Miconazole Nitrate 2 % OINT Apply to affected area twice daily for 7-14 days. (Patient not taking: Reported on 02/06/2015) 4 g 1  . Vitamin D, Ergocalciferol, (DRISDOL) 50000 UNITS CAPS capsule TAKE 1 CAPSULE (50,000 UNITS TOTAL) BY MOUTH EVERY 7 (SEVEN) DAYS. (Patient not taking: Reported on 02/06/2015) 4 capsule 2  . Vitamin D, Ergocalciferol, (DRISDOL) 50000 UNITS CAPS capsule Take one capsule by mouth every 7 days (Patient not taking: Reported on 02/06/2015) 4 capsule 11   No current facility-administered medications  for this visit.       Objective:    BP 131/78 mmHg  Pulse 89  Temp(Src) 98 F (36.7 C) (Oral)  Resp 16  Ht 5' (1.524 m)  Wt 166 lb 9.6 oz (75.569 kg)  BMI 32.54 kg/m2  SpO2 99% Physical Exam  Constitutional: She is oriented to person, place, and time. She appears well-developed and well-nourished. No distress.  HENT:  Head: Normocephalic and atraumatic.  Right Ear: External ear normal.  Left Ear: External ear normal.  Nose: Nose normal.  Mouth/Throat: Oropharynx is clear and moist.  Eyes: Conjunctivae and EOM are normal. Pupils are equal, round, and reactive to light.  Neck: Normal range of motion. Neck supple. Carotid bruit is not present. No thyromegaly present.  Cardiovascular: Normal rate, regular rhythm, normal heart sounds and intact distal pulses.  Exam reveals no gallop and no friction rub.   No murmur heard. Pulmonary/Chest: Effort normal and breath sounds normal. She has no wheezes. She has no rales.  Abdominal: Soft. Bowel sounds are normal. She exhibits no distension and no mass. There is no tenderness. There is no rebound and no guarding.  Lymphadenopathy:    She has no  cervical adenopathy.  Neurological: She is alert and oriented to person, place, and time. No cranial nerve deficit. She exhibits normal muscle tone. Coordination normal.  Skin: Skin is warm and dry. No rash noted. She is not diaphoretic. No erythema. No pallor.  Psychiatric: Her behavior is normal. Judgment and thought content normal. Her mood appears anxious.   EKG: NSR; no ST changes. PACs.  Poor R wave progression V1-V3.     Assessment & Plan:   1. Palpitations   2. Tachycardia   3. Generalized anxiety disorder   4. Urinary urgency   5. Hypothyroidism due to acquired atrophy of thyroid   6. Slow transit constipation    1. Palpitations: New.  Consistent with worsening anxiety; however, known hypertension and hyperlipidemia; thus, warrants rule out of cardiac arrhythmia. Refer to cardiology  for Event Monitor and possible 2D-echo +/- stress testing.  Documented PAC on EKG.  Obtain labs. 2.  Tachycardia: New.  Plan outlined above. 3.  Generalized anxiety disorder: worsening; recommend regular Xanax use; recommend contacting Dr. Delle Reining for follow-up appointment now.  Recommend caffeine avoidance. 4.  Urinary urgency: New.  Associated with recent constipation.  Send urine culture. Treat constipation with scheduled stool softener. 5.  Constipation; New.  Recommend scheduled stool softener such as Miralax or Colace daily.  Increase water and fiber intake.  Likely contributing to urinary symptoms. 6.  Hypothyroidism: over-corrected; repeat labs today.     Meds ordered this encounter  Medications  . clotrimazole-betamethasone (LOTRISONE) cream    Sig:     No Follow-up on file.    Khalidah Herbold Paulita Fujita, M.D. [facility] [address] [place], [place]  [address] [phone] phone [phone] fax

## 2015-02-08 LAB — URINE CULTURE: Colony Count: 30000

## 2015-02-11 ENCOUNTER — Other Ambulatory Visit: Payer: Self-pay | Admitting: Physician Assistant

## 2015-03-16 NOTE — Progress Notes (Signed)
History and physical examinations obtained with PA Clark. Agree with assessment and plan as outlined.  Inessa Wardrop Paulita Fujita, M.D. [facility] [address] [place], [place]  [address] [phone] phone [phone] fax

## 2015-03-29 ENCOUNTER — Other Ambulatory Visit: Payer: Self-pay | Admitting: Physician Assistant

## 2015-03-29 NOTE — Telephone Encounter (Signed)
[facility] calling about refill on Synthroid. Making this high priority because the pt is out and is anxious about this and the pharmacist asked [place] to prioritize it.

## 2015-04-28 ENCOUNTER — Other Ambulatory Visit: Payer: Self-pay | Admitting: Family Medicine

## 2015-05-09 ENCOUNTER — Encounter: Payer: Self-pay | Admitting: *Deleted

## 2015-06-24 ENCOUNTER — Other Ambulatory Visit: Payer: Self-pay | Admitting: Physician Assistant

## 2015-07-29 ENCOUNTER — Other Ambulatory Visit: Payer: Self-pay | Admitting: Family Medicine

## 2015-08-09 ENCOUNTER — Ambulatory Visit (INDEPENDENT_AMBULATORY_CARE_PROVIDER_SITE_OTHER): Payer: Commercial Managed Care - HMO | Admitting: Family Medicine

## 2015-08-09 ENCOUNTER — Encounter: Payer: Self-pay | Admitting: Family Medicine

## 2015-08-09 VITALS — BP 142/82 | HR 86 | Temp 98.3°F | Resp 16 | Ht 60.0 in | Wt 169.0 lb

## 2015-08-09 DIAGNOSIS — I10 Essential (primary) hypertension: Secondary | ICD-10-CM | POA: Diagnosis not present

## 2015-08-09 DIAGNOSIS — Z720 Tobacco use: Secondary | ICD-10-CM

## 2015-08-09 DIAGNOSIS — F411 Generalized anxiety disorder: Secondary | ICD-10-CM | POA: Diagnosis not present

## 2015-08-09 DIAGNOSIS — B029 Zoster without complications: Secondary | ICD-10-CM | POA: Diagnosis not present

## 2015-08-09 DIAGNOSIS — E034 Atrophy of thyroid (acquired): Secondary | ICD-10-CM | POA: Diagnosis not present

## 2015-08-09 DIAGNOSIS — E038 Other specified hypothyroidism: Secondary | ICD-10-CM | POA: Diagnosis not present

## 2015-08-09 DIAGNOSIS — E785 Hyperlipidemia, unspecified: Secondary | ICD-10-CM | POA: Diagnosis not present

## 2015-08-09 LAB — COMPREHENSIVE METABOLIC PANEL
ALT: 16 U/L (ref 6–29)
AST: 24 U/L (ref 10–35)
Albumin: 4.2 g/dL (ref 3.6–5.1)
Alkaline Phosphatase: 69 U/L (ref 33–130)
BUN: 8 mg/dL (ref 7–25)
CO2: 27 mmol/L (ref 20–31)
Calcium: 9.6 mg/dL (ref 8.6–10.4)
Chloride: 102 mmol/L (ref 98–110)
Creat: 0.77 mg/dL (ref 0.50–0.99)
Glucose, Bld: 88 mg/dL (ref 65–99)
Potassium: 4.2 mmol/L (ref 3.5–5.3)
Sodium: 140 mmol/L (ref 135–146)
Total Bilirubin: 0.5 mg/dL (ref 0.2–1.2)
Total Protein: 7.9 g/dL (ref 6.1–8.1)

## 2015-08-09 LAB — CBC WITH DIFFERENTIAL/PLATELET
Basophils Absolute: 0.1 10*3/uL (ref 0.0–0.1)
Basophils Relative: 1 % (ref 0–1)
Eosinophils Absolute: 0.1 10*3/uL (ref 0.0–0.7)
Eosinophils Relative: 2 % (ref 0–5)
HCT: 47 % — ABNORMAL HIGH (ref 36.0–46.0)
Hemoglobin: 16.2 g/dL — ABNORMAL HIGH (ref 12.0–15.0)
Lymphocytes Relative: 32 % (ref 12–46)
Lymphs Abs: 2.3 10*3/uL (ref 0.7–4.0)
MCH: 29.5 pg (ref 26.0–34.0)
MCHC: 34.5 g/dL (ref 30.0–36.0)
MCV: 85.6 fL (ref 78.0–100.0)
MPV: 12 fL (ref 8.6–12.4)
Monocytes Absolute: 0.9 10*3/uL (ref 0.1–1.0)
Monocytes Relative: 13 % — ABNORMAL HIGH (ref 3–12)
Neutro Abs: 3.7 10*3/uL (ref 1.7–7.7)
Neutrophils Relative %: 52 % (ref 43–77)
Platelets: 216 10*3/uL (ref 150–400)
RBC: 5.49 MIL/uL — ABNORMAL HIGH (ref 3.87–5.11)
RDW: 13.6 % (ref 11.5–15.5)
WBC: 7.1 10*3/uL (ref 4.0–10.5)

## 2015-08-09 LAB — LIPID PANEL
Cholesterol: 179 mg/dL (ref 125–200)
HDL: 36 mg/dL — ABNORMAL LOW (ref >=46)
LDL Cholesterol: 99 mg/dL (ref <130)
Total CHOL/HDL Ratio: 5 Ratio (ref <=5.0)
Triglycerides: 221 mg/dL — ABNORMAL HIGH (ref <150)
VLDL: 44 mg/dL — ABNORMAL HIGH (ref <30)

## 2015-08-09 LAB — TSH: TSH: 0.293 u[IU]/mL — ABNORMAL LOW (ref 0.350–4.500)

## 2015-08-09 MED ORDER — PREDNISONE 20 MG PO TABS: ORAL_TABLET | ORAL | Status: DC

## 2015-08-09 MED ORDER — VALACYCLOVIR HCL 1 G PO TABS: 1000.0000 mg | ORAL_TABLET | Freq: Three times a day (TID) | ORAL | Status: DC

## 2015-08-09 MED ORDER — GABAPENTIN 300 MG PO CAPS: ORAL_CAPSULE | ORAL | Status: DC

## 2015-08-09 NOTE — Patient Instructions (Signed)

## 2015-08-09 NOTE — Progress Notes (Signed)
Subjective:    Patient ID: Felicia Hanson, female    DOB: 1955-12-14, 59 y.o.   MRN: [id number]  08/09/2015  Rash and Headache   HPI This 59 y.o. female presents for six month follow-up with an acute concern:     1. Hypercholesterolemia: Patient reports good compliance with medication, good tolerance to medication, and good symptom control.  Dr. Jacinto Halim wanted a cholesterol level before his visit next week; had a small amount of food today.  2.  Rash: went to beach last Friday/week; really anxious about beach trip due to bad weather.  Had to share room with entire family.  Developed a pain in R posterior head and neck; occurred during car ride to beach; has suffered with horrible headache for hte past week. Has ached constantly.  IN last 2-3 days, has developed a rash along R posterior ear that are draining. Feels like cyst under the skin R posterior ear.  Husband says looks like mosquito bites.  Cysts are on scalp. Whelps on R shoulder.    3.  HTN: Patient reports good compliance with medication, good tolerance to medication, and good symptom control. Dr. Jacinto Halim changed Amlodipine to Metoprolol to help with palpitations and anxiety.   4.  Tobacco abuse:  Weaning cigarettes.  Does not want to take Chantix.    5.  Anxiety and depression: followed by Dr. Evelene Croon. Raising two grandchildren; teenage grandchild is bipolar and very hateful; pt is very overwhelmed; very little support from husband.  No SI or HI.  Dr. Evelene Croon wants pt to start Adderall but pt very reluctant until cardiac work up has been completed; Dr. Jacinto Halim has cleared pt for Adderall use.  Review of Systems  Constitutional: Positive for fatigue. Negative for fever, chills and diaphoresis.  HENT: Positive for congestion and ear pain. Negative for ear discharge, hearing loss, postnasal drip, rhinorrhea, sinus pressure and sore throat.   Eyes: Negative for visual disturbance.  Respiratory: Negative for cough and shortness of breath.     Cardiovascular: Negative for chest pain, palpitations and leg swelling.  Gastrointestinal: Negative for nausea, vomiting, abdominal pain, diarrhea and constipation.  Endocrine: Negative for cold intolerance, heat intolerance, polydipsia, polyphagia and polyuria.  Skin: Positive for rash.  Neurological: Positive for headaches. Negative for dizziness, tremors, seizures, syncope, facial asymmetry, speech difficulty, weakness, light-headedness and numbness.  Psychiatric/Behavioral: Positive for dysphoric mood. Negative for suicidal ideas, sleep disturbance and self-injury. The patient is nervous/anxious.     Past Medical History  Diagnosis Date  . Depression   . Anxiety   . Hypertension   . Hyperlipidemia   . Thyroid disease    Past Surgical History  Procedure Laterality Date  . Cholecystectomy    . Tubal ligation    . Colon surgery      diverticulitis with perforation; s/p colon resection.  . Colonoscopy     No Known Allergies Current Outpatient Prescriptions  Medication Sig Dispense Refill  . ALPRAZolam (XANAX) 1 MG tablet Take 1 mg by mouth daily as needed.    Marland Kitchen aspirin 81 MG tablet Take 81 mg by mouth daily.    . clotrimazole-betamethasone (LOTRISONE) cream     . levothyroxine (SYNTHROID, LEVOTHROID) 100 MCG tablet TAKE 1 TABLET (100 MCG TOTAL) BY MOUTH DAILY BEFORE BREAKFAST. 90 tablet 0  . metoprolol (LOPRESSOR) 50 MG tablet Take 50 mg by mouth 2 (two) times daily.    . pravastatin (PRAVACHOL) 40 MG tablet TAKE 1 TABLET (40 MG TOTAL) BY MOUTH DAILY 30  tablet 0  . gabapentin (NEURONTIN) 300 MG capsule One tablet at bedtime for two nights; then can increase to one tablet twice daily for two days; then can increase to one tablet three times daily as needed for pain. 90 capsule 2  . metoprolol tartrate (LOPRESSOR) 25 MG tablet Take 25 mg by mouth 2 (two) times daily.  2  . Miconazole Nitrate 2 % OINT Apply to affected area twice daily for 7-14 days. (Patient not taking: Reported on  02/06/2015) 4 g 1  . predniSONE (DELTASONE) 20 MG tablet Three tablets daily x 2 days, then two tablets daily x 5 days, then one tablet daily x 5 days 21 tablet 0  . valACYclovir (VALTREX) 1000 MG tablet Take 1 tablet (1,000 mg total) by mouth 3 (three) times daily. 21 tablet 0   No current facility-administered medications for this visit.   Social History   Social History  . Marital Status: Married    Spouse Name: N/A  . Number of Children: N/A  . Years of Education: N/A   Occupational History  . Not on file.   Social History Main Topics  . Smoking status: Current Every Day Smoker -- 1.00 packs/day for 38 years    Types: Cigarettes  . Smokeless tobacco: Not on file  . Alcohol Use: No  . Drug Use: No  . Sexual Activity: Yes   Other Topics Concern  . 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 Cancer Mother     unknown primary  . Hyperlipidemia Mother        Objective:    BP 142/82 mmHg  Pulse 86  Temp(Src) 98.3 F (36.8 C) (Oral)  Resp 16  Ht 5' (1.524 m)  Wt 169 lb (76.658 kg)  BMI 33.01 kg/m2 Physical Exam  Constitutional: She is oriented to person, place, and time. She appears well-developed and well-nourished. No distress.  HENT:  Head: Normocephalic and atraumatic.    Right Ear: Tympanic membrane, external ear and ear canal normal.  Left Ear: External ear and ear canal normal. Tympanic membrane is erythematous.  Nose: Nose normal.  Mouth/Throat: Oropharynx is clear and moist. No oropharyngeal exudate.  Vesicular rash along post-auricular region and R trapezius region; extends into R submandibular region and into R posterior scalp. Posterior ear external with vesicles.  Eyes: Conjunctivae and EOM are normal. Pupils  are equal, round, and reactive to light.  Neck: Normal range of motion. Neck supple. Carotid bruit is not present. No thyromegaly present.  Cardiovascular: Normal rate, regular rhythm, normal heart sounds and intact distal pulses.  Exam reveals no gallop and no friction rub.   No murmur heard. Pulmonary/Chest: Effort normal and breath sounds normal. She has no wheezes. She has no rales.  Abdominal: Soft. Bowel sounds are normal. She exhibits no distension and no mass. There is no tenderness. There is no rebound and no guarding.  Lymphadenopathy:    She has no cervical adenopathy.  Neurological: She is alert and oriented to person, place, and time. No cranial nerve deficit.  Skin: Skin is warm and dry. Rash noted. She is not diaphoretic. No  erythema. No pallor.  Psychiatric: She has a normal mood and affect. Her behavior is normal.   Results for orders placed or performed in visit on 02/06/15  Urine culture  Result Value Ref Range   Colony Count 30,000 COLONIES/ML    Organism ID, Bacteria Multiple bacterial morphotypes present, none    Organism ID, Bacteria predominant. Suggest appropriate recollection if     Organism ID, Bacteria clinically indicated.   T4, free  Result Value Ref Range   Free T4 1.47 0.80 - 1.80 ng/dL  TSH  Result Value Ref Range   TSH 0.359 0.350 - 4.500 uIU/mL  CBC with Differential/Platelet  Result Value Ref Range   WBC 11.1 (H) 4.0 - 10.5 K/uL   RBC 5.15 (H) 3.87 - 5.11 MIL/uL   Hemoglobin 15.1 (H) 12.0 - 15.0 g/dL   HCT 43.3 36.0 - 46.0 %   MCV 84.1 78.0 - 100.0 fL   MCH 29.3 26.0 - 34.0 pg   MCHC 34.9 30.0 - 36.0 g/dL   RDW 13.3 11.5 - 15.5 %   Platelets 302 150 - 400 K/uL   MPV 11.8 8.6 - 12.4 fL   Neutrophils Relative % 44 43 - 77 %   Neutro Abs 4.9 1.7 - 7.7 K/uL   Lymphocytes Relative 44 12 - 46 %   Lymphs Abs 4.9 (H) 0.7 - 4.0 K/uL   Monocytes Relative 8 3 - 12 %   Monocytes Absolute 0.9 0.1 - 1.0 K/uL   Eosinophils Relative 3 0 - 5 %    Eosinophils Absolute 0.3 0.0 - 0.7 K/uL   Basophils Relative 1 0 - 1 %   Basophils Absolute 0.1 0.0 - 0.1 K/uL   Smear Review Criteria for review not met   Comprehensive metabolic panel  Result Value Ref Range   Sodium 140 135 - 145 mEq/L   Potassium 4.1 3.5 - 5.3 mEq/L   Chloride 102 96 - 112 mEq/L   CO2 27 19 - 32 mEq/L   Glucose, Bld 71 70 - 99 mg/dL   BUN 7 6 - 23 mg/dL   Creat 0.66 0.50 - 1.10 mg/dL   Total Bilirubin 0.3 0.2 - 1.2 mg/dL   Alkaline Phosphatase 79 39 - 117 U/L   AST 21 0 - 37 U/L   ALT 14 0 - 35 U/L   Total Protein 8.0 6.0 - 8.3 g/dL   Albumin 4.2 3.5 - 5.2 g/dL   Calcium 9.7 8.4 - 10.5 mg/dL  POCT urinalysis dipstick  Result Value Ref Range   Color, UA yellow    Clarity, UA cloudy    Glucose, UA neg    Bilirubin, UA neg    Ketones, UA neg    Spec Grav, UA 1.010    Blood, UA trace    pH, UA 6.5    Protein, UA neg    Urobilinogen, UA 0.2    Nitrite, UA neg    Leukocytes, UA Negative   POCT UA - Microscopic Only  Result Value Ref Range   WBC, Ur, HPF, POC 0-3    RBC, urine, microscopic 0-1    Bacteria, U Microscopic neg    Mucus, UA neg    Epithelial cells, urine per micros 0-3    Crystals, Ur, HPF, POC neg    Casts, Ur, LPF, POC neg    Yeast, UA neg        Assessment & Plan:   1. Hyperlipidemia   2. Hypothyroidism due to acquired atrophy of  thyroid   3. Essential hypertension, benign   4. Herpes zoster   5. Generalized anxiety disorder   6. Tobacco abuse     1. Hyperlipidemia: stable; obtain labs; continue current medications. 2.  Hypothyroidism: stable; obtain labs; adjust medication if indicated. 3.  HTN: controlled; switched Amlodipine to Metoprolol for palpitations and anxiety; moderately controlled today but suffering with acute illness. No changes at this time. 4.  Generalized anxiety and depression: worsening due to family stressors; managed by Dr. Toy Care; no SI. 5.  Tobacco abuse: contemplative; weaning slowly; reluctant to take  Chantix. 6.  Herpes Zoster: New.  C3 distribution primarily. Rx for Valtrex, Prednisone, Neurontin provided.   Orders Placed This Encounter  Procedures  . CBC with Differential/Platelet  . Comprehensive metabolic panel    Order Specific Question:  Has the patient fasted?    Answer:  Yes  . Lipid panel    Order Specific Question:  Has the patient fasted?    Answer:  Yes  . TSH   Meds ordered this encounter  Medications  . metoprolol (LOPRESSOR) 50 MG tablet    Sig: Take 50 mg by mouth 2 (two) times daily.  . metoprolol tartrate (LOPRESSOR) 25 MG tablet    Sig: Take 25 mg by mouth 2 (two) times daily.    Refill:  2  . valACYclovir (VALTREX) 1000 MG tablet    Sig: Take 1 tablet (1,000 mg total) by mouth 3 (three) times daily.    Dispense:  21 tablet    Refill:  0  . predniSONE (DELTASONE) 20 MG tablet    Sig: Three tablets daily x 2 days, then two tablets daily x 5 days, then one tablet daily x 5 days    Dispense:  21 tablet    Refill:  0  . gabapentin (NEURONTIN) 300 MG capsule    Sig: One tablet at bedtime for two nights; then can increase to one tablet twice daily for two days; then can increase to one tablet three times daily as needed for pain.    Dispense:  90 capsule    Refill:  2    Return in about 6 months (around 02/06/2016) for complete physical examiniation.     Izaac Reisig Elayne Guerin, M.D. [facility] [address] [place], [place]  [address] [phone] phone [phone] fax

## 2015-08-10 ENCOUNTER — Telehealth: Payer: Self-pay

## 2015-08-10 NOTE — Telephone Encounter (Signed)
Patient is being treated by Dr. Katrinka Blazing for shingles.   They now appear to be inside of her ear.  Please call and address her concerns.  She is taking the medication    [phone]

## 2015-08-11 ENCOUNTER — Telehealth: Payer: Self-pay

## 2015-08-11 NOTE — Telephone Encounter (Signed)
Patient would like another call back. I gave patient the previous message which advised her to come in if she get worse. She states that Dr. Katrinka Blazing told her that if it gets in her eye then she should come back. Patient states that it's now in her ear and should like to double check it another visit is necessary or not. Please call!

## 2015-08-11 NOTE — Telephone Encounter (Signed)
I called, advised if getting worse we should see her.

## 2015-08-12 ENCOUNTER — Ambulatory Visit: Payer: 59 | Admitting: Family Medicine

## 2015-08-13 MED ORDER — LEVOTHYROXINE SODIUM 88 MCG PO TABS: 88.0000 ug | ORAL_TABLET | Freq: Every day | ORAL | Status: DC

## 2015-08-13 NOTE — Telephone Encounter (Signed)
Patient reports that her symptoms have significantly improved. She admits that she was having swelling and rash going around her right ear. However she has taken medication as prescribed and has since had significant improvement. Denies that the rash spread to her eyes, denies facial droop. Advised she continue taking medication as she has and return to clinic if rash fails to resolve completely.

## 2015-08-13 NOTE — Addendum Note (Signed)
Addended by: Ethelda Chick on: 08/13/2015 08:26 AM   Modules accepted: Orders

## 2015-08-20 ENCOUNTER — Telehealth: Payer: Self-pay

## 2015-08-20 NOTE — Telephone Encounter (Signed)
Called and spoke with pt and she stated that she has finished the acyclovir for the shingles.  She stated that she will finish the prednisone tomorrow and she still has plenty of pain meds.  She has 2 questions.   1.  She stated that this week she has been feeling very fatigued and tired all the time.  She did not know if this was something normal that she should be feeling, or if it is just part of the process of healing after having the shingles. She was curious as to how long she may feel like this?  2.  She wanted to know how long are you contagious?  She is around her 2 granddaughters daily and didn't know if she should have not been?    She is not in a hurry for these questions to be answered. She stated when ever Dr. Katrinka Blazing has time.  Will forward to Dr. Katrinka Blazing for further recs.  thanks

## 2015-08-20 NOTE — Telephone Encounter (Signed)
Patient is calling because she states she has several question in regards to her shingles. Please call! [phone]

## 2015-08-20 NOTE — Telephone Encounter (Signed)
Call-- I expect patient to start feeling better very soon. If she does not start feeling better in the upcoming 72 hours, please let me know.  Is she in a lot of pain?  Is she getting up at night to urinate excessively?

## 2015-08-21 NOTE — Telephone Encounter (Signed)
Spoke with pt, advised message from Dr. Smith. Pt understood. 

## 2015-08-21 NOTE — Telephone Encounter (Signed)
Call --- patient should not be contagious at this point.

## 2015-08-21 NOTE — Telephone Encounter (Signed)
I spoke with Pt. She is not in a lot of pain, since she is still taking the pain medication. She did want to know is she still contagious, because her husband has not had the shingles vaccine yet, and she is around others who haven't had theirs yet either. She has been getting up frequently at night to urinate but not much urine output.  Please advise.

## 2015-08-23 ENCOUNTER — Telehealth: Payer: Self-pay

## 2015-08-23 NOTE — Telephone Encounter (Signed)
Pt was told to call us back if she still feels weak. She said that currently she feels very weak every other day; weak to the point where she feels "numb". Also, Pt also wants to know if it's safe for her to get her hair colored?

## 2015-08-23 NOTE — Telephone Encounter (Signed)
Pt wants to come back in but she wants an appt with Dr. Katrinka Blazing only. Can we fit her in?

## 2015-08-23 NOTE — Telephone Encounter (Signed)
See previous message

## 2015-08-23 NOTE — Telephone Encounter (Signed)
Spoke with pt, advised message from Nicole. Pt understood. 

## 2015-08-23 NOTE — Telephone Encounter (Signed)
Pt needs to return for follow up if she is still not feeling well. We would expect her to be getting better at this point. Should be fine for her to get her hair colored as long as the rash has resolved and no longer has any open lesions.

## 2015-08-23 NOTE — Telephone Encounter (Signed)
Call--- 1. Agree; recommend reevaluation if not feeling any better at this point; however, I am out of town this weekend and am not seeing patients next week until Thursday night.  Recommend evaluation this weekend by another provider.

## 2015-08-26 NOTE — Telephone Encounter (Signed)
Spoke with pt, she states she is feeling a little better. She had energy yesterday and feels she does not need to come in. I advised pt to come in if she has any concerns. Pt understood.

## 2015-09-01 ENCOUNTER — Other Ambulatory Visit: Payer: Self-pay | Admitting: Physician Assistant

## 2015-09-25 ENCOUNTER — Other Ambulatory Visit: Payer: Self-pay | Admitting: Family Medicine

## 2015-10-16 ENCOUNTER — Telehealth: Payer: Self-pay

## 2015-10-16 NOTE — Telephone Encounter (Signed)
PATIENT STATES SHE WAS DIAGNOSED IN EARLY SEPT. BY DR. Katrinka Blazing WITH SHINGLES. DR. Katrinka Blazing DID NOT WANT HER TO GET THE FLU SHOT AT THAT TIME. SHE WOULD NOW LIKE [facility] AND [facility] THE FLU SHOT AND THE PNEUMONIA SHOT BUT SHE WANTS TO BE SURE THAT IT IS NOT TOO SOON? PLEASE CALL TO LET HER KNOW. BEST PHONE [phone] (HOME) MBC

## 2015-10-16 NOTE — Telephone Encounter (Signed)
Call --- 1. Just fine for [facility] to [facility].  2.  We gave patient a pneumonia shot in 2013.  She does not need another pneumonia shot until age 59. p u

## 2015-10-17 NOTE — Telephone Encounter (Signed)
Pt.notified

## 2015-11-26 ENCOUNTER — Ambulatory Visit (INDEPENDENT_AMBULATORY_CARE_PROVIDER_SITE_OTHER): Payer: Commercial Managed Care - HMO | Admitting: Family Medicine

## 2015-11-26 ENCOUNTER — Ambulatory Visit (INDEPENDENT_AMBULATORY_CARE_PROVIDER_SITE_OTHER): Payer: Commercial Managed Care - HMO

## 2015-11-26 VITALS — BP 128/80 | HR 77 | Temp 97.8°F | Resp 18 | Ht 60.0 in | Wt 163.0 lb

## 2015-11-26 DIAGNOSIS — R05 Cough: Secondary | ICD-10-CM | POA: Diagnosis not present

## 2015-11-26 DIAGNOSIS — R059 Cough, unspecified: Secondary | ICD-10-CM

## 2015-11-26 MED ORDER — DOXYCYCLINE HYCLATE 100 MG PO CAPS: 100.0000 mg | ORAL_CAPSULE | Freq: Two times a day (BID) | ORAL | Status: DC

## 2015-11-26 NOTE — Patient Instructions (Signed)
You have requested an antibiotic so I will rx doxycycline for you. Take this twice a day for 10 days.  Let us know if you do not feel better

## 2015-11-26 NOTE — Progress Notes (Signed)
[facility] [address], [place] [address] [phone]  Date:  11/26/2015   Name:  Felicia Hanson   DOB:  1956/06/01   MRN:  [id number]  PCP:  Nilda Simmer, MD    Chief Complaint: Cough; Chest Pain; and Shortness of Breath   History of Present Illness:  Felicia Hanson is a 59 y.o. very pleasant female patient who presents with the following:  Established pt here with illness.  Last night she noted a "tightness in my chest and having trouble breathing, runny nose."  She developed a cough yesterday- it is generally dry but she was able to bring up some mucus with mucinex.  She noted that the mucus was green and thought she should get it checked out No fever.  No chest pain.  She is a smoker.  She has not really noticed any wheezing with this illness or otherwise.   As far as smoking, she she has cut down from 3 PPD to 3/4 PPD recently.  She feels that with her anxiety she may not be able to quit smoking and that people should just understand this and not ask her to stop.    She saw Dr. Jacinto Halim this spring and had a cardiac evaluation including an echo and stress test.  I can see the echo but not the stress results- she reports that her eval was normal   Her son has been ill not long ago.  He had to take antibiotics.   She took prednisone back in September for shingles- she did not like taking it because it made her feel strange.  She insists that she needs abx today.  She declines prednisone, inhaled steroid, and albuterol. I have tried to explain to her why abx are generally not indicated for less than 24 hours of cough. She became upset when I suggested that she also needs to stop smoking.  "I came her for antibiotics.  If you're not going to give them to me I will just leave."  Patient Active Problem List   Diagnosis Date Noted  . Generalized anxiety disorder 01/10/2014  . Unspecified vitamin D deficiency 01/10/2014  . Essential hypertension, benign  01/10/2014  . Pure hypercholesterolemia 01/10/2014  . Unspecified hypothyroidism 01/10/2014    Past Medical History  Diagnosis Date  . Depression   . Anxiety   . Hypertension   . Hyperlipidemia   . Thyroid disease     Past Surgical History  Procedure Laterality Date  . Cholecystectomy    . Tubal ligation    . Colon surgery      diverticulitis with perforation; s/p colon resection.  . Colonoscopy      Social History  Substance Use Topics  . Smoking status: Current Every Day Smoker -- 0.75 packs/day for 38 years    Types: Cigarettes  . Smokeless tobacco: None  . Alcohol Use: No    Family History  Problem Relation Age of Onset  . Cancer Mother     unknown primary  . Hyperlipidemia Mother     No Known Allergies  Medication list has been reviewed and updated.  Current Outpatient Prescriptions on File Prior to Visit  Medication Sig Dispense Refill  . ALPRAZolam (XANAX) 1 MG tablet Take 1 mg by mouth daily as needed.    Marland Kitchen aspirin 81 MG tablet Take 81 mg by mouth daily.    . clotrimazole-betamethasone (LOTRISONE) cream     . levothyroxine (SYNTHROID, LEVOTHROID) 88 MCG tablet Take  1 tablet (88 mcg total) by mouth daily before breakfast. 90 tablet 1  . metoprolol (LOPRESSOR) 50 MG tablet Take 50 mg by mouth 2 (two) times daily.    . pravastatin (PRAVACHOL) 40 MG tablet TAKE 1 TABLET BY MOUTH DAILY 30 tablet 3  . amphetamine-dextroamphetamine (ADDERALL) 20 MG tablet Reported on 11/26/2015  0  . gabapentin (NEURONTIN) 300 MG capsule One tablet at bedtime for two nights; then can increase to one tablet twice daily for two days; then can increase to one tablet three times daily as needed for pain. (Patient not taking: Reported on 11/26/2015) 90 capsule 2  . metoprolol tartrate (LOPRESSOR) 25 MG tablet Take 25 mg by mouth 2 (two) times daily. Reported on 11/26/2015  2  . Miconazole Nitrate 2 % OINT Apply to affected area twice daily for 7-14 days. (Patient not taking: Reported  on 02/06/2015) 4 g 1  . predniSONE (DELTASONE) 20 MG tablet Three tablets daily x 2 days, then two tablets daily x 5 days, then one tablet daily x 5 days (Patient not taking: Reported on 11/26/2015) 21 tablet 0  . valACYclovir (VALTREX) 1000 MG tablet Take 1 tablet (1,000 mg total) by mouth 3 (three) times daily. (Patient not taking: Reported on 11/26/2015) 21 tablet 0   No current facility-administered medications on file prior to visit.    Review of Systems:  As per HPI- otherwise negative.   Physical Examination: Filed Vitals:   11/26/15 1403  BP: 128/80  Pulse: 77  Temp: 97.8 F (36.6 C)  Resp: 18   Filed Vitals:   11/26/15 1403  Height: 5' (1.524 m)  Weight: 163 lb (73.936 kg)   Body mass index is 31.83 kg/(m^2). Ideal Body Weight: Weight in (lb) to have BMI = 25: 127.7  GEN: WDWN, NAD, Non-toxic, A & O x 3, obese, looks well HEENT: Atraumatic, Normocephalic. Neck supple. No masses, No LAD.  Bilateral TM wnl, oropharynx normal.  PEERL,EOMI.   Ears and Nose: No external deformity. CV: RRR, No M/G/R. No JVD. No thrill. No extra heart sounds. PULM: CTA B, no wheezes, crackles, rhonchi. No retractions. No resp. distress. No accessory muscle use. Lung exam is benign. She denies any current SOB EXTR: No c/c/e NEURO Normal gait.  PSYCH: Normally interactive. Conversant. Not depressed or anxious appearing.  Calm demeanor.   [facility] reading (PRIMARY) by  Dr. Patsy Lager. CXR: negative  CHEST 2 VIEW  COMPARISON: 05/06/2006  FINDINGS: The heart size and mediastinal contours are within normal limits. Both lungs are clear. The visualized skeletal structures are unremarkable.  IMPRESSION: No active cardiopulmonary disease.  Assessment and Plan: Cough - Plan: DG Chest 2 View, doxycycline (VIBRAMYCIN) 100 MG capsule  Here today with less than 24 hours of cough, and she coughed up discolored mucus once.  Suspect a viral illness with perhaps an element of COPD as she is a long  term smoker. Recommended prednisone- if not that then an inhaled steroid and/ or albuterol.  She became upset with me and stated that she only wanted an abx as above.   Gave her an rx for doxycycline and asked her to let us know if not better soon  Signed Abbe Amsterdam, MD

## 2015-11-29 ENCOUNTER — Telehealth: Payer: Self-pay

## 2015-11-29 DIAGNOSIS — R059 Cough, unspecified: Secondary | ICD-10-CM

## 2015-11-29 DIAGNOSIS — R05 Cough: Secondary | ICD-10-CM

## 2015-11-29 NOTE — Telephone Encounter (Signed)
Patient states antibiotics spilled and got wet and is only two days into the medication.    [phone]

## 2015-12-01 MED ORDER — DOXYCYCLINE HYCLATE 100 MG PO CAPS: 100.0000 mg | ORAL_CAPSULE | Freq: Two times a day (BID) | ORAL | Status: DC

## 2015-12-01 NOTE — Telephone Encounter (Signed)
May we resent her doxy? Hers has gotten wet

## 2015-12-01 NOTE — Telephone Encounter (Signed)
IC pt at [phone] - LMOVM that [facility] PA-C has sent in a [facility] [address]

## 2015-12-01 NOTE — Telephone Encounter (Signed)
Script for doxycycline resent.

## 2015-12-31 ENCOUNTER — Other Ambulatory Visit: Payer: Self-pay | Admitting: Family Medicine

## 2016-01-09 ENCOUNTER — Other Ambulatory Visit: Payer: Self-pay | Admitting: Physician Assistant

## 2016-01-10 ENCOUNTER — Telehealth: Payer: Self-pay

## 2016-01-10 NOTE — Telephone Encounter (Signed)
[facility] for the [facility] of [facility]ravastatin for the patient.

## 2016-01-13 NOTE — Telephone Encounter (Signed)
This has already been sent in.

## 2016-02-07 ENCOUNTER — Ambulatory Visit: Payer: Commercial Managed Care - HMO | Admitting: Family Medicine

## 2016-02-13 ENCOUNTER — Other Ambulatory Visit: Payer: Self-pay

## 2016-02-13 MED ORDER — PRAVASTATIN SODIUM 40 MG PO TABS: 40.0000 mg | ORAL_TABLET | Freq: Every day | ORAL | Status: DC

## 2016-02-18 ENCOUNTER — Ambulatory Visit: Payer: Commercial Managed Care - HMO | Admitting: Family Medicine

## 2016-03-24 ENCOUNTER — Other Ambulatory Visit: Payer: Self-pay | Admitting: Family Medicine

## 2016-04-03 ENCOUNTER — Telehealth: Payer: Self-pay

## 2016-04-03 NOTE — Telephone Encounter (Signed)
Pt is transfering her records to dr Hal Hope in [place], but is out of synthoid and needs a refill before she can see he Best number [phone]

## 2016-04-04 ENCOUNTER — Other Ambulatory Visit: Payer: Self-pay | Admitting: *Deleted

## 2016-04-04 DIAGNOSIS — E039 Hypothyroidism, unspecified: Secondary | ICD-10-CM

## 2016-04-04 MED ORDER — LEVOTHYROXINE SODIUM 88 MCG PO TABS: ORAL_TABLET | ORAL | Status: DC

## 2016-04-22 ENCOUNTER — Other Ambulatory Visit: Payer: Self-pay | Admitting: Physician Assistant

## 2016-05-13 ENCOUNTER — Telehealth: Payer: Self-pay

## 2016-05-13 NOTE — Telephone Encounter (Signed)
Pt needs a refill on Bevespi. She only received samples from Dr. Katrinka Blazing for her to try to see if she would like it. She does and also pt would like to have this refilled before Katrinka Blazing gets back because that will be too long  Please advise  [phone]

## 2016-05-15 ENCOUNTER — Telehealth: Payer: Self-pay

## 2016-05-15 NOTE — Telephone Encounter (Signed)
LMOM advising that I do not see any mention of Dr Katrinka Blazing giving her samples of Bevespi, and that our office does not normally give out samples. Explained that she is overdue for a follow up with Dr Katrinka Blazing for meds and chronic issues and advised that she should RTC and discuss any new medications with provider.

## 2016-05-15 NOTE — Telephone Encounter (Signed)
Patient called in about her prescription Bevespi. I read her the note per Sheppard Plumber and patient got upset. She states that maybe it was another doctor and if Dr. Katrinka Blazing doesn't RTC until 05/19/16 how is that suppose to help her if she's having breathing issues now. I explained to patient that she can RTC to see anyone of our providers and pt hung up on me.

## 2016-05-24 ENCOUNTER — Other Ambulatory Visit: Payer: Self-pay | Admitting: Family Medicine

## 2016-06-02 ENCOUNTER — Other Ambulatory Visit: Payer: Self-pay | Admitting: Family Medicine

## 2016-08-12 ENCOUNTER — Ambulatory Visit: Payer: Commercial Managed Care - HMO | Admitting: Family Medicine

## 2016-09-13 ENCOUNTER — Emergency Department (HOSPITAL_COMMUNITY): Payer: Commercial Managed Care - HMO

## 2016-09-13 ENCOUNTER — Emergency Department (HOSPITAL_COMMUNITY)
Admission: EM | Admit: 2016-09-13 | Discharge: 2016-09-14 | Disposition: A | Discharge status: Home / Self Care | Payer: Commercial Managed Care - HMO | Attending: Emergency Medicine | Admitting: Emergency Medicine

## 2016-09-13 ENCOUNTER — Encounter (HOSPITAL_COMMUNITY): Payer: Self-pay

## 2016-09-13 DIAGNOSIS — I10 Essential (primary) hypertension: Secondary | ICD-10-CM | POA: Diagnosis not present

## 2016-09-13 DIAGNOSIS — E039 Hypothyroidism, unspecified: Secondary | ICD-10-CM | POA: Insufficient documentation

## 2016-09-13 DIAGNOSIS — R0789 Other chest pain: Secondary | ICD-10-CM

## 2016-09-13 DIAGNOSIS — F17210 Nicotine dependence, cigarettes, uncomplicated: Secondary | ICD-10-CM | POA: Insufficient documentation

## 2016-09-13 DIAGNOSIS — Z7982 Long term (current) use of aspirin: Secondary | ICD-10-CM | POA: Diagnosis not present

## 2016-09-13 DIAGNOSIS — R079 Chest pain, unspecified: Secondary | ICD-10-CM | POA: Diagnosis present

## 2016-09-13 LAB — HEPATIC FUNCTION PANEL
ALT: 21 U/L (ref 14–54)
AST: 37 U/L (ref 15–41)
Albumin: 4 g/dL (ref 3.5–5.0)
Alkaline Phosphatase: 65 U/L (ref 38–126)
Bilirubin, Direct: 0.2 mg/dL (ref 0.1–0.5)
Indirect Bilirubin: 0.4 mg/dL (ref 0.3–0.9)
Total Bilirubin: 0.6 mg/dL (ref 0.3–1.2)
Total Protein: 7.8 g/dL (ref 6.5–8.1)

## 2016-09-13 LAB — I-STAT TROPONIN, ED
Troponin i, poc: 0.01 ng/mL (ref 0.00–0.08)
Troponin i, poc: 0.01 ng/mL (ref 0.00–0.08)

## 2016-09-13 LAB — BASIC METABOLIC PANEL
Anion gap: 10 (ref 5–15)
BUN: 8 mg/dL (ref 6–20)
CO2: 26 mmol/L (ref 22–32)
Calcium: 9.6 mg/dL (ref 8.9–10.3)
Chloride: 103 mmol/L (ref 101–111)
Creatinine, Ser: 0.74 mg/dL (ref 0.44–1.00)
GFR calc Af Amer: >60 mL/min (ref >60)
GFR calc non Af Amer: >60 mL/min (ref >60)
Glucose, Bld: 88 mg/dL (ref 65–99)
Potassium: 3.7 mmol/L (ref 3.5–5.1)
Sodium: 139 mmol/L (ref 135–145)

## 2016-09-13 LAB — D-DIMER, QUANTITATIVE: D-Dimer, Quant: 0.42 ug/mL-FEU (ref 0.00–0.50)

## 2016-09-13 LAB — T4, FREE: Free T4: 0.93 ng/dL (ref 0.61–1.12)

## 2016-09-13 LAB — TSH: TSH: 0.687 u[IU]/mL (ref 0.350–4.500)

## 2016-09-13 LAB — CBC
HCT: 47.1 % — ABNORMAL HIGH (ref 36.0–46.0)
Hemoglobin: 15.8 g/dL — ABNORMAL HIGH (ref 12.0–15.0)
MCH: 29.8 pg (ref 26.0–34.0)
MCHC: 33.5 g/dL (ref 30.0–36.0)
MCV: 88.9 fL (ref 78.0–100.0)
Platelets: 198 10*3/uL (ref 150–400)
RBC: 5.3 MIL/uL — ABNORMAL HIGH (ref 3.87–5.11)
RDW: 12.6 % (ref 11.5–15.5)
WBC: 9.5 10*3/uL (ref 4.0–10.5)

## 2016-09-13 MED ORDER — LORAZEPAM 1 MG PO TABS
2.0000 mg | ORAL_TABLET | Freq: Once | ORAL | Status: AC
  Administered 2016-09-13: 2 mg via ORAL
  Filled 2016-09-13: qty 2

## 2016-09-13 NOTE — ED Triage Notes (Signed)
Pt reports having back pain that radiates to her chest that started last night. She also reports a small amount of left shoulder pain. Pt reports some SOB. P reports taking an 162mg  ASA pta.

## 2016-09-13 NOTE — ED Notes (Signed)
Called main lab requesting status of PT-INR. Was told they would start working on it now.

## 2016-09-13 NOTE — ED Notes (Signed)
Called lab to add on hfp

## 2016-09-13 NOTE — ED Provider Notes (Signed)
[facility]-EMERGENCY DEPT Provider Note  CSN: [id number] Arrival Date & Time: 09/13/16 @ 1551  History    Chief Complaint Chief Complaint  Patient presents with  . Chest Pain    HPI Jacalynn CRYSTOL WALPOLE is a 60 y.o. female. Patient presents for back pain since 9 AM that was soar ache. Back pain is located most under the  1 hour later the back pain began to radiate chest pain. Patient endorsed felt similar to "muscle pain" and her previous "anxiety attacks." Patient took two 81 mg ASA and ativan which did not relieve it. Patient went to urgent care where they obtained ECG that showed "enlarged left side of my heart" per the patient. No previous history of CAD. No previous DVT or PE. History of HTN, HLD, hypothyroidism, anxiety and depression.  Past Medical & Surgical History    Past Medical History:  Diagnosis Date  . Anxiety   . Depression   . Hyperlipidemia   . Hypertension   . Thyroid disease    Patient Active Problem List   Diagnosis Date Noted  . Generalized anxiety disorder 01/10/2014  . Unspecified vitamin D deficiency 01/10/2014  . Essential hypertension, benign 01/10/2014  . Pure hypercholesterolemia 01/10/2014  . Hypothyroidism 01/10/2014   Past Surgical History:  Procedure Laterality Date  . CHOLECYSTECTOMY    . COLON SURGERY     diverticulitis with perforation; s/p colon resection.  . Colonoscopy    . TUBAL LIGATION      Family & Social History    Family History  Problem Relation Age of Onset  . Cancer Mother     unknown primary  . Hyperlipidemia Mother    Social History  Substance Use Topics  . Smoking status: Current Every Day Smoker    Packs/day: 0.75    Years: 38.00    Types: Cigarettes  . Smokeless tobacco: Never Used  . Alcohol use No    Home Medications    Prior to Admission medications   Medication Sig Start Date End Date Taking? Authorizing Provider  ALPRAZolam Prudy Feeler) 1 MG tablet Take 0.5 mg by mouth 3 (three) times daily as needed for  anxiety or sleep.  08/21/13  Yes Historical Provider, MD  aspirin 81 MG tablet Take 81 mg by mouth daily.   Yes Historical Provider, MD  levothyroxine (SYNTHROID, LEVOTHROID) 88 MCG tablet TAKE 1 TABLET (88 MCG TOTAL) BY MOUTH DAILY BEFORE BREAKFAST. 04/04/16  Yes Ethelda Chick, MD  pravastatin (PRAVACHOL) 40 MG tablet TAKE 1 TABLET (40 MG TOTAL) BY MOUTH DAILY. 04/22/16  Yes Ethelda Chick, MD  amphetamine-dextroamphetamine (ADDERALL) 20 MG tablet Reported on 11/26/2015 06/14/15   Historical Provider, MD  clotrimazole-betamethasone Thurmond Butts) cream  11/14/14   Historical Provider, MD  doxycycline (VIBRAMYCIN) 100 MG capsule Take 1 capsule (100 mg total) by mouth 2 (two) times daily. 12/01/15   Wallis Bamberg, PA-C  metoprolol (LOPRESSOR) 50 MG tablet Take 50 mg by mouth 2 (two) times daily.    Historical Provider, MD  metoprolol tartrate (LOPRESSOR) 25 MG tablet Take 25 mg by mouth 2 (two) times daily. Reported on 11/26/2015 05/21/15   Historical Provider, MD    Allergies    Review of patient's allergies indicates no known allergies.  I reviewed & agree with nursing's documentation on the patient's past medical, surgical, social & family histories as well as their allergies.  Review of Systems  Complete ROS obtained, and is negative except as stated in HPI.  Physical Exam  Updated Vital Signs BP Marland Kitchen)  144/116   Pulse 70   Temp 97.7 F (36.5 C) (Oral)   Resp 20   SpO2 98%  I have reviewed the triage vital signs and the nursing notes. Physical Exam CONST: Patient alert, well appearing, in no apparent distress, oriented to person, place and time.  EYES: PERRLA. EOMI. Conjunctiva w/o d/c. Lids AT w/o swelling.  ENMT: External Nares & Ears AT w/o swelling. Oropharynx patent. MM moist.  NECK: ROM full w/o rigidity. Trachea midline. JVD absent.  CVS: +S1/S2 w/o obvious murmur. Lower extremities w/o pitting edema.  RESP: Respiratory effort unlabored w/o retractions & accessory muscle use. BS clear  bilaterally.  GI: Soft & ND. +BS x 4. TTP absent. Hernia absent. Guarding & Rebound absent.  BACK: CVA TTP absent bilaterally.  SKIN: Skin warm & dry. Turgor good. No rash.  PSYCH: Alert. Oriented. Affect and mood appropriate.  NEURO: CN II-XII grossly intact. Motor exam symmetric w/ upper & lower extremities 5/5 bilaterally. Sensation grossly intact.  MSK: Joints located & stable, w/o obvious dislocation & obvious deformity or crepitus absent w/ Cap refill < 2 sec. Peripheral pulses 2+ & equal in all extremities.   ED Treatments & Results   Labs (only abnormal results are displayed) Labs Reviewed  CBC - Abnormal; Notable for the following:       Result Value   RBC 5.30 (*)    Hemoglobin 15.8 (*)    HCT 47.1 (*)    All other components within normal limits  BASIC METABOLIC PANEL  HEPATIC FUNCTION PANEL  D-DIMER, QUANTITATIVE (NOT AT [facility])  TSH  T4, FREE  I-STAT TROPOININ, ED  I-STAT TROPOININ, ED    EKG    EKG Interpretation  Date/Time:  Sunday September 13 2016 15:54:39 EDT Ventricular Rate:  66 PR Interval:  176 QRS Duration: 102 QT Interval:  394 QTC Calculation: 413 R Axis:   -27 Text Interpretation:  Sinus rhythm with Premature supraventricular complexes and with occasional Premature ventricular complexes Septal infarct , age undetermined Abnormal ECG No significant change since last tracing Baseilne artifact Confirmed by ISAACS MD, Sheria LangAMERON [phone]([id number]) on 09/14/2016 12:23:42 AM       Radiology Dg Chest 2 View  Result Date: 09/13/2016 CLINICAL DATA:  Left sided CP since 9 am; Pain began posteriorly, just under scapula, and now radiates to the anterior left chest; Pain is intermittent and strong at times. No known heart problems; HTN meds; smoker 2 PPD X 40 years EXAM: CHEST  2 VIEW COMPARISON:  Chest x-ray dated 11/03/2013. FINDINGS: Heart size is upper normal, stable. Overall cardiomediastinal silhouette is stable. Lungs are at least mildly hyperexpanded suggesting  COPD. Lungs are clear. No evidence of pneumonia. No pleural effusion or pneumothorax seen. Osseous and soft tissue structures about the chest are unremarkable. IMPRESSION: 1. No acute findings.  No evidence of pneumonia or pulmonary edema. 2. Probable COPD. Electronically Signed   By: Bary RichardStan  Maynard M.D.   On: 09/13/2016 16:54    Pertinent labs & imaging results that were available during my care of the patient were independently visualized by me and considered in my medical decision making, please see chart for details. Formal interpretation provided by Radiology.  Procedures (including critical care time) Procedures  Medications Ordered in ED Medications  LORazepam (ATIVAN) tablet 2 mg (2 mg Oral Given 09/13/16 2008)    Initial Impression & Plan / ED Course & Results / Final Disposition   Initial Impression & Plan Patient presents with shortness breath and chest pain that  is endorsed as a dull ache underlying her left scapula. Patient endorses she has not had any recent trauma no previous pneumothorax and due to absence of productive cough fever or other concerning vital signs do not have concern for sepsis or pneumonia at this time. Patient is still smoking 1-2 packs of cigarettes per day however has no decrease air movement or endorsement of wheezing and do not believe COPD or other obstructive lung disease exacerbation is occurring at this time.  I obtained EKG and appreciate no evidence of acute STEMI at this time. No evidence of hyperkalemia or low voltage and no electrical alternans.  HEAR Clinical Decision Tool used for ACS Risk Stratification. Their Risk Score was 3. (A HEAR Score of 0-3 is Low, a Score of 4-8 is High)  Given patient has no known CAD, risk stratification of the patient's H&P, risk factors (HTN, tobacco abuse, and HLD) & ECG was performed. I also obtained serial cardiac troponin to further evaluate for ACS. In light of above, I believe their risk for ACS to be low.  Therefore through shared decision making with the patient, will consider for discharge if troponin remain undetectable. Patient in agreement.  ED Course & Results Delta troponin obtained and I appreciate troponin levels are undetectable. D-dimer obtained and I appreciate per age-adjusted cut off no elevation, and therefore do not have concern for pulmonary embolus at this time.  Chest x-ray reviewed and I appreciate no abnormalities of heart borders, evidence of pulmonary edema pneumothorax or focal infiltrate concerning for pneumonia.  Final Disposition Reassessment of the patient reveals no acute concerns. Resting comfortably in bed. Prior to discharge, ambulation evaluation completed in the ED w/o assistance. Trial revealed no remarkable discomfort & they were able to bear weight & transfer weight appropriately w/o concerns for instability or near syncope.  ED Course in its entirety, care plan & clinical impressions w/ associated risks were reviewed w/ the patient and spouse/SO. In light of the patient's reassuring evaluation above, I consider discharge disposition reasonable. They are in agreement. I gave my typical, strict return precautions in simple, non-technical language. We also discussed symptoms that are most concerning & would necessitate emergent return. I explicitly told them to immediately return to the ED if new symptoms develop, if worse, or for ANY concern. Treatments & follow up plan agreed upon & I confirmed all concerns & questions were addressed prior to discharge.  Follow Up Ethelda Chick, MD [address] [place] [address] [phone]  Schedule an appointment as soon as possible for a visit in 2 days For symptomatic reassessment  [facility] EMERGENCY DEPARTMENT [address] [id number] mc [place] [address] [phone] Go to  FOR ANY CONCERNS OR IF WORSE OR CHEST PAIN RETURNS  Ethelda Chick, MD [address] [place] [address] [phone]  Call today For symptomatic reassessment AND FOR DISCUSSION OF DEPAKOTE PRESCRIPTION   New Prescriptions Discharge Medication List as of 09/14/2016 12:19 AM        Final Clinical Impression & ED Diagnoses   1. Chest wall pain   2. Atypical chest pain   3. Nonspecific chest pain    Patient care discussed with the attending physician, Dr. Erma Heritage, who oversaw their evaluation & treatment & voiced agreement.  Note: This document was prepared using Dragon voice recognition software and may include unintentional dictation errors.  House Officer: Jonette Eva, MD, Emergency Medicine Resident.   Jonette Eva, MD 09/14/16 [phone]  Shaune Pollack, MD 09/14/16 [phone]

## 2016-09-14 NOTE — ED Notes (Signed)
Pt showing NAD. RR even and unlabored. Family at bedside. Voices no questions/concerns at this time. 

## 2016-09-17 ENCOUNTER — Ambulatory Visit: Payer: Commercial Managed Care - HMO

## 2016-09-17 ENCOUNTER — Ambulatory Visit (INDEPENDENT_AMBULATORY_CARE_PROVIDER_SITE_OTHER): Payer: Commercial Managed Care - HMO | Admitting: Family Medicine

## 2016-09-17 VITALS — BP 134/64 | HR 61 | Temp 97.8°F | Resp 17 | Ht 60.0 in | Wt 175.0 lb

## 2016-09-17 DIAGNOSIS — Z23 Encounter for immunization: Secondary | ICD-10-CM

## 2016-09-17 DIAGNOSIS — S238XXA Sprain of other specified parts of thorax, initial encounter: Secondary | ICD-10-CM

## 2016-09-17 DIAGNOSIS — I10 Essential (primary) hypertension: Secondary | ICD-10-CM

## 2016-09-17 DIAGNOSIS — E7800 Pure hypercholesterolemia, unspecified: Secondary | ICD-10-CM | POA: Diagnosis not present

## 2016-09-17 DIAGNOSIS — F411 Generalized anxiety disorder: Secondary | ICD-10-CM | POA: Diagnosis not present

## 2016-09-17 DIAGNOSIS — E034 Atrophy of thyroid (acquired): Secondary | ICD-10-CM | POA: Diagnosis not present

## 2016-09-17 NOTE — Patient Instructions (Addendum)
     IF you received an x-ray today, you will receive an invoice from [facility]. Please contact [facility] at [phone] with questions or concerns regarding your invoice.   IF you received labwork today, you will receive an invoice from [facility]. Please contact [facility] at [phone] with questions or concerns regarding your invoice.   Our billing staff will not be able to assist you with questions regarding bills from these companies.  You will be contacted with the lab results as soon as they are available. The fastest way to get your results is to activate your My Chart account. Instructions are located on the last page of this paperwork. If you have not heard from us regarding the results in 2 weeks, please contact this office.     We recommend that you schedule a mammogram for breast cancer screening. Typically, you do not need a referral to do this. Please contact a local imaging center to schedule your mammogram.  [facility] - [phone]  *ask for the Radiology Department [facility] - [phone] or [phone]  [facility] - [phone] [facility] - [phone] [facility] - [phone]  *ask for the Radiology Department [facility] - [phone]  *ask for the Radiology Department [facility] - [phone]  *ask for the Mammography Department [facility] - [phone]  

## 2016-09-17 NOTE — Progress Notes (Signed)
Subjective:    Patient ID: Felicia Hanson, female    DOB: 03-Oct-1956, 60 y.o.   MRN: [id number]  09/17/2016  Follow-up (Hospital/ discuss BP medication)   HPI This 60 y.o. female presents for ED FOLLOW-UP.  Developed a huge charlie horse in L lateral side; recurred charlie horse the following day.  Then took 1/2 Xanax to see if anxiety.  One hour later, pain radiated through to the chest.  Went to [facility]; EKG abnormal and CXR showed enlarged heart.  Referred to ED; s/p cardiac enzymes and CXR.  Troponins negative.  BP was not good during ED visit.  By midnight, husband got frustrated.  With his frustration, BP increased.  Discharged to follow-up.  Had planned to come up anyway.  Hal Hope started a new BP medication.  Around 1:00am the pain subsided; administered Ativan.  Pain had eased up at discharge. Pain never left all week long.  Able to take a deep breath; would hurt if took deep breath or moved in sleep. Around noon yesterday, came back full force again.  Was in back, radiation into breast, into arm. Feels like a bad toothache.  Went to store and took 2 aleve; within one hour, pain had improved.  Before went to bed, pain was gone.  Felt like a pinched nerve.    BP Readings from Last 3 Encounters:  09/17/16 134/64  09/13/16 (!) 144/116  11/26/15 128/80    Anxiety: worries excessively about everything.  Hal Hope was PCP prior to me.  Nadyne Coombes moved 5 minutes away from patient; went to see Hal Hope in [employer].  Started on new anxiety medication; Depakote started.  Took for three weeks and then ran out.  ED provider  Questioned Depakote.  Does not want to be tired all the time.  If takes Xanax will be tired for two days.  Had taken Wellbutrin in the past for depression; added something else to help with anxiety.  Stopped both medications at one time.  Having mostly anxiety; depressed about weight; cut back on cigarettes.  Decreased form 3 ppd to 1 ppd.  Drinking more  water.  Still has Mt. Dew before bedtime.     HTN: Dr. Hal Hope started Losartan 50mg  daily.  Increased Metoprolol which was started by ganji.    Review of Systems  Constitutional: Negative for chills, diaphoresis, fatigue and fever.  Eyes: Negative for visual disturbance.  Respiratory: Negative for cough and shortness of breath.   Cardiovascular: Positive for chest pain. Negative for palpitations and leg swelling.  Gastrointestinal: Negative for abdominal pain, constipation, diarrhea, nausea and vomiting.  Endocrine: Negative for cold intolerance, heat intolerance, polydipsia, polyphagia and polyuria.  Musculoskeletal: Positive for myalgias. Negative for neck pain and neck stiffness.  Neurological: Negative for dizziness, tremors, seizures, syncope, facial asymmetry, speech difficulty, weakness, light-headedness, numbness and headaches.  Psychiatric/Behavioral: Positive for dysphoric mood. Negative for self-injury, sleep disturbance and suicidal ideas. The patient is nervous/anxious.     Past Medical History:  Diagnosis Date  . Anxiety   . Depression   . Hyperlipidemia   . Hypertension   . Thyroid disease    Past Surgical History:  Procedure Laterality Date  . CHOLECYSTECTOMY    . COLON SURGERY     diverticulitis with perforation; s/p colon resection.  . Colonoscopy    . TUBAL LIGATION     No Known Allergies  Social History   Social History  . Marital status: Married    Spouse name: N/A  .  Number of children: N/A  . Years of education: N/A   Occupational History  . Not on file.   Social History Main Topics  . Smoking status: Current Every Day Smoker    Packs/day: 0.75    Years: 38.00    Types: Cigarettes  . Smokeless tobacco: Never Used  . Alcohol use No  . Drug use: No  . Sexual activity: Yes   Other Topics Concern  . 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 Cancer Mother     unknown primary  . Hyperlipidemia Mother        Objective:    BP 134/64 (BP Location: Right Arm, Patient Position: Sitting, Cuff Size: Normal)   Pulse 61   Temp 97.8 F (36.6 C) (Oral)   Resp 17   Ht 5' (1.524 m)   Wt 175 lb (79.4 kg)   SpO2 98%   BMI 34.18 kg/m  Physical Exam  Constitutional: She is oriented to person, place, and time. She appears well-developed and well-nourished. No distress.  HENT:  Head: Normocephalic and atraumatic.  Right Ear: External ear normal.  Left Ear: External ear normal.  Nose: Nose normal.  Mouth/Throat: Oropharynx is clear and moist.  Eyes: Conjunctivae and EOM are normal. Pupils are equal, round, and reactive to light.  Neck: Normal range of motion. Neck supple. Carotid bruit is not present. No thyromegaly present.  Cardiovascular: Normal rate, regular rhythm, normal heart sounds and intact distal pulses.  Exam reveals no gallop and no friction rub.   No murmur heard. Pulmonary/Chest: Effort normal and breath sounds normal. She has no wheezes. She has no rales.  Abdominal: Soft. Bowel sounds are normal. She exhibits no distension and no mass. There is no tenderness. There is no rebound and no guarding.  Musculoskeletal:       Right shoulder: Normal.       Left shoulder: Normal. She exhibits normal range of motion and no tenderness.       Cervical back: Normal. She exhibits normal range of motion, no tenderness and no bony tenderness.       Thoracic back: Normal. She exhibits normal range of motion, no tenderness and no bony tenderness.       Lumbar back: Normal. She exhibits normal range of motion, no tenderness, no bony tenderness, no swelling, no pain and no spasm.  Lymphadenopathy:    She has no cervical adenopathy.  Neurological: She is alert and oriented to person,  place, and time. No cranial nerve deficit.  Skin: Skin is warm and dry. No rash noted. She is not diaphoretic. No erythema. No pallor.  Psychiatric: Her speech is normal and behavior is normal. Judgment and thought content normal. Her mood appears anxious. Cognition and memory are normal.   Results for orders placed or performed during the hospital encounter of 09/13/16  Basic metabolic panel  Result Value Ref Range   Sodium 139 135 - 145 mmol/L   Potassium 3.7 3.5 - 5.1 mmol/L   Chloride 103 101 - 111 mmol/L   CO2 26 22 - 32 mmol/L   Glucose, Bld 88 65 -  99 mg/dL   BUN 8 6 - 20 mg/dL   Creatinine, Ser 4.090.74 0.44 - 1.00 mg/dL   Calcium 9.6 8.9 - 81.110.3 mg/dL   GFR calc non Af Amer >60 >60 mL/min   GFR calc Af Amer >60 >60 mL/min   Anion gap 10 5 - 15  CBC  Result Value Ref Range   WBC 9.5 4.0 - 10.5 K/uL   RBC 5.30 (H) 3.87 - 5.11 MIL/uL   Hemoglobin 15.8 (H) 12.0 - 15.0 g/dL   HCT 91.447.1 (H) 78.236.0 - 95.646.0 %   MCV 88.9 78.0 - 100.0 fL   MCH 29.8 26.0 - 34.0 pg   MCHC 33.5 30.0 - 36.0 g/dL   RDW 21.312.6 08.611.5 - 57.815.5 %   Platelets 198 150 - 400 K/uL  Hepatic function panel  Result Value Ref Range   Total Protein 7.8 6.5 - 8.1 g/dL   Albumin 4.0 3.5 - 5.0 g/dL   AST 37 15 - 41 U/L   ALT 21 14 - 54 U/L   Alkaline Phosphatase 65 38 - 126 U/L   Total Bilirubin 0.6 0.3 - 1.2 mg/dL   Bilirubin, Direct 0.2 0.1 - 0.5 mg/dL   Indirect Bilirubin 0.4 0.3 - 0.9 mg/dL  D-dimer, quantitative (not at [facility])  Result Value Ref Range   D-Dimer, Quant 0.42 0.00 - 0.50 ug/mL-FEU  TSH  Result Value Ref Range   TSH 0.687 0.350 - 4.500 uIU/mL  T4, free  Result Value Ref Range   Free T4 0.93 0.61 - 1.12 ng/dL  I-stat troponin, ED  Result Value Ref Range   Troponin i, poc 0.01 0.00 - 0.08 ng/mL   Comment 3          I-stat troponin, ED  Result Value Ref Range   Troponin i, poc 0.01 0.00 - 0.08 ng/mL   Comment 3               Assessment & Plan:   1. Essential hypertension, benign   2.  Generalized anxiety disorder   3. Sprain of chest wall, initial encounter   4. Hypothyroidism due to acquired atrophy of thyroid   5. Pure hypercholesterolemia   6. Need for prophylactic vaccination and inoculation against influenza    -ED records reviewed in detail; clinically improved with Aleve; recommend continuing Aleve bid PRN pain; recommend rest, ice to area, and stretching. -continue Losartan daily for blood pressure. -recommend restarting Cymbalta for anxiety.   Orders Placed This Encounter  Procedures  . Flu Vaccine QUAD 36+ mos IM   Meds ordered this encounter  Medications  . LOSARTAN POTASSIUM PO    Sig: Take 50 mg by mouth daily.    Return in about 3 months (around 12/18/2016) for recheck high blood pressure.   Aysen Shieh Paulita FujitaMartin Anala Whisenant, M.D. [facility]  [place] [address] [place], [place]  [id number] [phone] phone [phone] fax

## 2016-09-21 ENCOUNTER — Telehealth: Payer: Self-pay

## 2016-09-21 NOTE — Telephone Encounter (Signed)
Pt called back and said her cymbalta 60 mg was expired and she threw it out however when she was recently in our office and Dr. Katrinka Blazing told her she would call in her a prescription but wanted to double check that she didn't already have any left over at home before writing new prescription   Contact [phone]

## 2016-09-21 NOTE — Telephone Encounter (Signed)
[facility] asking [place] to substitute cymbalta for divalproex. i  See cymbalta on med list from 12/2015 but not on current med list. Last ov not acute, 08/2015. Left message for pharmacy that patient needs an appointment to discuss.

## 2016-09-22 MED ORDER — DULOXETINE HCL 60 MG PO CPEP: 60.0000 mg | ORAL_CAPSULE | Freq: Every day | ORAL | 5 refills | Status: DC

## 2016-09-22 NOTE — Telephone Encounter (Signed)
Sent in refill of Cymbalta to pharmacy; we did discuss pt restarting Cymbalta at recent visit.

## 2016-09-24 ENCOUNTER — Telehealth: Payer: Self-pay | Admitting: Emergency Medicine

## 2016-09-24 ENCOUNTER — Ambulatory Visit: Payer: Commercial Managed Care - HMO

## 2016-09-24 NOTE — Telephone Encounter (Signed)
Pt aware medication at pharmacy 

## 2016-09-27 ENCOUNTER — Other Ambulatory Visit: Payer: Self-pay | Admitting: Family Medicine

## 2016-10-02 ENCOUNTER — Telehealth: Payer: Self-pay | Admitting: *Deleted

## 2016-10-02 NOTE — Telephone Encounter (Signed)
Dr. Katrinka Blazing could not close chart due to flu shot not documented.    Look in book and it was not documented.  Need to call patient to make sure she got her flu shot on 09/17/16.  No answer/no vm

## 2016-10-05 ENCOUNTER — Telehealth: Payer: Self-pay

## 2016-10-05 NOTE — Telephone Encounter (Signed)
LMVM for patient to CB.   

## 2016-10-05 NOTE — Telephone Encounter (Signed)
Pt was returning call that was made today  Please advise  [phone]

## 2016-10-07 NOTE — Telephone Encounter (Signed)
Spoke to patient and she states she did NOT get flu shot while she was here.  [facility] and they told her she had it in March of this year and would not need it again until 2018 in August.  I advised the patient that I was not sure that this information was correct and I would send a message to Dr. Katrinka Blazing for her advise.  Thank you!

## 2016-10-08 ENCOUNTER — Other Ambulatory Visit: Payer: Self-pay | Admitting: Family Medicine

## 2016-10-08 NOTE — Telephone Encounter (Signed)
Call --- patient does need a repeat flu vaccine now for the current flu season (September 2017 through March 2018). The flu vaccine that she received March 2017 covered her for last year's flu season (September 2016 through March 2017).

## 2016-10-09 ENCOUNTER — Telehealth: Payer: Self-pay | Admitting: Emergency Medicine

## 2016-10-09 MED ORDER — PRAVASTATIN SODIUM 40 MG PO TABS: 40.0000 mg | ORAL_TABLET | Freq: Every day | ORAL | 3 refills | Status: DC

## 2016-10-09 NOTE — Telephone Encounter (Signed)
Pt informed she is covered for yearly Flu vaccine Requesting medication refill Pravastatin- Sent to pharmacy

## 2016-10-09 NOTE — Telephone Encounter (Signed)
Pt states she didn't get flu shot during appr.  Pt had one 01/2016 and was told she when she called in later that she didn't need one and was protected through this flu season.

## 2016-10-09 NOTE — Telephone Encounter (Signed)
Dr. Katrinka Blazing, This patient need clarification on getting this year flu shot. [facility] told her because she received her flu shot March, 2107 and last year strain is the same, she would not need next vaccine until August 2018.   Please advise

## 2016-10-12 NOTE — Telephone Encounter (Signed)
I disagree.  I recommend repeat flu vaccine now.

## 2016-10-12 NOTE — Telephone Encounter (Signed)
Called pt and advised her of Dr Michaelle Copas recommendation. She stated she will come on in for a flu shot.

## 2016-10-26 ENCOUNTER — Telehealth: Payer: Self-pay | Admitting: Family Medicine

## 2016-10-26 ENCOUNTER — Telehealth: Payer: Self-pay | Admitting: Emergency Medicine

## 2016-10-26 ENCOUNTER — Other Ambulatory Visit: Payer: Self-pay | Admitting: Emergency Medicine

## 2016-10-26 ENCOUNTER — Telehealth: Payer: Self-pay

## 2016-10-26 MED ORDER — AMOXICILLIN 500 MG PO CAPS: 500.0000 mg | ORAL_CAPSULE | Freq: Three times a day (TID) | ORAL | 0 refills | Status: DC

## 2016-10-26 NOTE — Telephone Encounter (Signed)
Pt is needing the amoxicillian called in she got a message today to say it would be called in   Best number [phone]

## 2016-10-26 NOTE — Telephone Encounter (Signed)
Left message medication was [facility]scribed to [facility] per request on [address]

## 2016-10-26 NOTE — Telephone Encounter (Signed)
Pt will pick antibiotic up today Advised to call clinic if no improvement to schedule office visit Verbalized understanding

## 2016-10-26 NOTE — Telephone Encounter (Signed)
Received answering service call Sun 11/26 1:30 pm. Pt with PMHx COPD has had several days of cough and congestion, low grade fever, mild DOE. Her cough is now productive with green mucous.  No chest pain, tightness, palp. No SHoB at rest.  Has not received flu shot this year. Has an inhaler at home - unsure what it is though and non on pt's med list. Has tried mucinex once  but no other otc meds tried  Suspect COPD exac. Start using inhaler. starting flu season and pt not yet received flu shot though is immunosuppressed due to COPD dx so would be reasonable to try treatment in hopes of pt being able to avoid picking up a secondary infection from our waiting room - start amox 500 tid x 1 wk   I informed pt that I am currently traveling and in car for sev hrs so will not get a chance to review chart or rx meds until later tonight. She should call tomorrow to let us know how she is doing and make appt to be seen if worsening as may need to add in prednisone 40 qd x 5d.

## 2016-11-10 ENCOUNTER — Other Ambulatory Visit: Payer: Self-pay | Admitting: Family Medicine

## 2016-11-10 NOTE — Telephone Encounter (Signed)
Called in Metoprolol  Left message with patient

## 2016-12-14 ENCOUNTER — Other Ambulatory Visit: Payer: Self-pay | Admitting: Family Medicine

## 2016-12-17 NOTE — Telephone Encounter (Signed)
08/2016 last ov 

## 2016-12-28 ENCOUNTER — Other Ambulatory Visit: Payer: Self-pay | Admitting: Family Medicine

## 2016-12-28 DIAGNOSIS — Z23 Encounter for immunization: Secondary | ICD-10-CM | POA: Diagnosis not present

## 2016-12-28 NOTE — Telephone Encounter (Signed)
Meds ordered this encounter  Medications  . levothyroxine (SYNTHROID, LEVOTHROID) 88 MCG tablet    Sig: TAKE 1 TABLET (88 MCG TOTAL) BY MOUTH DAILY BEFORE BREAKFAST.    Dispense:  90 tablet    Refill:  0   Spoke with patient. Advised of refill and need for follow-up with Dr. Katrinka Blazing. Patient reports needing flu vaccine. Encouraged her to come in TODAY for vaccine and schedule follow-up visit while she is here.

## 2017-01-23 ENCOUNTER — Telehealth: Payer: Self-pay | Admitting: Family Medicine

## 2017-01-25 NOTE — Telephone Encounter (Signed)
Meds ordered this encounter  Medications  . DULoxetine (CYMBALTA) 60 MG capsule    Sig: TAKE ONE CAPSULE BY MOUTH DAILY    Dispense:  30 capsule    Refill:  0    Please advise patient that she needs visit for additional fills.   Please contact patient and schedule follow-up with Dr. Katrinka Blazing.

## 2017-02-02 DIAGNOSIS — Z1211 Encounter for screening for malignant neoplasm of colon: Secondary | ICD-10-CM | POA: Diagnosis not present

## 2017-02-02 DIAGNOSIS — J069 Acute upper respiratory infection, unspecified: Secondary | ICD-10-CM | POA: Diagnosis not present

## 2017-02-25 ENCOUNTER — Other Ambulatory Visit: Payer: Self-pay | Admitting: Physician Assistant

## 2017-03-01 ENCOUNTER — Other Ambulatory Visit: Payer: Self-pay | Admitting: Physician Assistant

## 2017-03-02 NOTE — Telephone Encounter (Signed)
08/2016 last ov 

## 2017-03-03 NOTE — Telephone Encounter (Signed)
Refill for Cymbalta denied; pt overdue for follow-up appointment.  Please schedule OV with me.

## 2017-03-03 NOTE — Telephone Encounter (Signed)
Pt scheduled with you for a med refill on 03/17/17 at 4:30

## 2017-03-17 ENCOUNTER — Encounter: Payer: Self-pay | Admitting: Family Medicine

## 2017-03-17 ENCOUNTER — Ambulatory Visit (INDEPENDENT_AMBULATORY_CARE_PROVIDER_SITE_OTHER): Payer: Commercial Managed Care - HMO | Admitting: Family Medicine

## 2017-03-17 VITALS — BP 142/83 | HR 82 | Temp 98.2°F | Resp 16 | Ht 60.0 in | Wt 175.2 lb

## 2017-03-17 DIAGNOSIS — E7800 Pure hypercholesterolemia, unspecified: Secondary | ICD-10-CM

## 2017-03-17 DIAGNOSIS — E034 Atrophy of thyroid (acquired): Secondary | ICD-10-CM | POA: Diagnosis not present

## 2017-03-17 DIAGNOSIS — Z114 Encounter for screening for human immunodeficiency virus [HIV]: Secondary | ICD-10-CM | POA: Diagnosis not present

## 2017-03-17 DIAGNOSIS — Z1159 Encounter for screening for other viral diseases: Secondary | ICD-10-CM

## 2017-03-17 DIAGNOSIS — F411 Generalized anxiety disorder: Secondary | ICD-10-CM

## 2017-03-17 DIAGNOSIS — Z20818 Contact with and (suspected) exposure to other bacterial communicable diseases: Secondary | ICD-10-CM | POA: Diagnosis not present

## 2017-03-17 DIAGNOSIS — I10 Essential (primary) hypertension: Secondary | ICD-10-CM

## 2017-03-17 DIAGNOSIS — Z1211 Encounter for screening for malignant neoplasm of colon: Secondary | ICD-10-CM

## 2017-03-17 LAB — POCT URINALYSIS DIP (MANUAL ENTRY)
Bilirubin, UA: NEGATIVE
Blood, UA: NEGATIVE
Glucose, UA: NEGATIVE mg/dL
Leukocytes, UA: NEGATIVE
Nitrite, UA: NEGATIVE
Spec Grav, UA: 1.025 (ref 1.010–1.025)
Urobilinogen, UA: 2 E.U./dL — AB
pH, UA: 6.5 (ref 5.0–8.0)

## 2017-03-17 LAB — POCT RAPID STREP A (OFFICE): Rapid Strep A Screen: NEGATIVE

## 2017-03-17 MED ORDER — METOPROLOL TARTRATE 50 MG PO TABS: 50.0000 mg | ORAL_TABLET | Freq: Two times a day (BID) | ORAL | 1 refills | Status: DC

## 2017-03-17 MED ORDER — LEVOTHYROXINE SODIUM 88 MCG PO TABS: ORAL_TABLET | ORAL | 1 refills | Status: DC

## 2017-03-17 MED ORDER — PRAVASTATIN SODIUM 40 MG PO TABS: 40.0000 mg | ORAL_TABLET | Freq: Every day | ORAL | 3 refills | Status: DC

## 2017-03-17 MED ORDER — DULOXETINE HCL 60 MG PO CPEP: 120.0000 mg | ORAL_CAPSULE | Freq: Every day | ORAL | 1 refills | Status: DC

## 2017-03-17 NOTE — Patient Instructions (Addendum)
   IF you received an x-ray today, you will receive an invoice from [facility]. Please contact [facility] at [phone] with questions or concerns regarding your invoice.   IF you received labwork today, you will receive an invoice from [facility]. Please contact [facility] at [phone] with questions or concerns regarding your invoice.   Our billing staff will not be able to assist you with questions regarding bills from these companies.  You will be contacted with the lab results as soon as they are available. The fastest way to get your results is to activate your My Chart account. Instructions are located on the last page of this paperwork. If you have not heard from us regarding the results in 2 weeks, please contact this office.      Fat and Cholesterol Restricted Diet Getting too much fat and cholesterol in your diet may cause health problems. Following this diet helps keep your fat and cholesterol at normal levels. This can keep you from getting sick. What types of fat should I choose?  Choose monosaturated and polyunsaturated fats. These are found in foods such as olive oil, canola oil, flaxseeds, walnuts, almonds, and seeds.  Eat more omega-3 fats. Good choices include salmon, mackerel, sardines, tuna, flaxseed oil, and ground flaxseeds.  Limit saturated fats. These are in animal products such as meats, butter, and cream. They can also be in plant products such as palm oil, palm kernel oil, and coconut oil.  Avoid foods with partially hydrogenated oils in them. These contain trans fats. Examples of foods that have trans fats are stick margarine, some tub margarines, cookies, crackers, and other baked goods. What general guidelines do I need to follow?  Check food labels. Look for the words "trans fat" and "saturated fat."  When preparing a meal:  Fill half of your plate with vegetables and green salads.  Fill one fourth of your plate with whole  grains. Look for the word "whole" as the first word in the ingredient list.  Fill one fourth of your plate with lean protein foods.  Eat more foods that have fiber, like apples, carrots, beans, peas, and barley.  Eat more home-cooked foods. Eat less at restaurants and buffets.  Limit or avoid alcohol.  Limit foods high in starch and sugar.  Limit fried foods.  Cook foods without frying them. Baking, boiling, grilling, and broiling are all great options.  Lose weight if you are overweight. Losing even a small amount of weight can help your overall health. It can also help prevent diseases such as diabetes and heart disease. What foods can I eat? Grains  Whole grains, such as whole wheat or whole grain breads, crackers, cereals, and pasta. Unsweetened oatmeal, bulgur, barley, quinoa, or brown rice. Corn or whole wheat flour tortillas. Vegetables  Fresh or frozen vegetables (raw, steamed, roasted, or grilled). Green salads. Fruits  All fresh, canned (in natural juice), or frozen fruits. Meat and Other Protein Products  Ground beef (85% or leaner), grass-fed beef, or beef trimmed of fat. Skinless chicken or turkey. Ground chicken or turkey. Pork trimmed of fat. All fish and seafood. Eggs. Dried beans, peas, or lentils. Unsalted nuts or seeds. Unsalted canned or dry beans. Dairy  Low-fat dairy products, such as skim or 1% milk, 2% or reduced-fat cheeses, low-fat ricotta or cottage cheese, or plain low-fat yogurt. Fats and Oils  Tub margarines without trans fats. Light or reduced-fat mayonnaise and salad dressings. Avocado. Olive, canola, sesame, or safflower oils. Natural peanut   or almond butter (choose ones without added sugar and oil). The items listed above may not be a complete list of recommended foods or beverages. Contact your dietitian for more options.  What foods are not recommended? Grains  White bread. White pasta. White rice. Cornbread. Bagels, pastries, and croissants.  Crackers that contain trans fat. Vegetables  White potatoes. Corn. Creamed or fried vegetables. Vegetables in a cheese sauce. Fruits  Dried fruits. Canned fruit in light or heavy syrup. Fruit juice. Meat and Other Protein Products  Fatty cuts of meat. Ribs, chicken wings, bacon, sausage, bologna, salami, chitterlings, fatback, hot dogs, bratwurst, and packaged luncheon meats. Liver and organ meats. Dairy  Whole or 2% milk, cream, half-and-half, and cream cheese. Whole milk cheeses. Whole-fat or sweetened yogurt. Full-fat cheeses. Nondairy creamers and whipped toppings. Processed cheese, cheese spreads, or cheese curds. Sweets and Desserts  Corn syrup, sugars, honey, and molasses. Candy. Jam and jelly. Syrup. Sweetened cereals. Cookies, pies, cakes, donuts, muffins, and ice cream. Fats and Oils  Butter, stick margarine, lard, shortening, ghee, or bacon fat. Coconut, palm kernel, or palm oils. Beverages  Alcohol. Sweetened drinks (such as sodas, lemonade, and fruit drinks or punches). The items listed above may not be a complete list of foods and beverages to avoid. Contact your dietitian for more information.  This information is not intended to replace advice given to you by your health care provider. Make sure you discuss any questions you have with your health care provider. Document Released: 05/17/2012 Document Revised: 07/23/2016 Document Reviewed: 02/15/2014 Elsevier Interactive Patient Education  2017 [employer]  

## 2017-03-17 NOTE — Progress Notes (Signed)
Subjective:    Patient ID: Felicia Hanson, female    DOB: [birth year], 61 y.o.   MRN: [id number]  03/17/2017  Medication Refill (cymbalta wants 90 day supply its cheaper)   HPI This 61 y.o. female presents for evaluation hypertension, hypothyroidism, hypercholesterolemia, and anxiety/depression.   Management changes made at last visit included restarting Cymbalta; feeling much better. Had some nausea for the first week.  Then nausea resolved.  Lost ten pounds since last visit.  Not checking BP at home.  Two granddaughter went to doctor today; dx with strep throat.  Has appointment.  No sore throat yet.  Husband might had it.  Has been running low grade temperature for the past two nights.  No headache.  Did have a bad headache for 2-3 days; took Aleve. Having dry skin with Cymbalta; has skin tags everywhere; skin will break easier.    Immunization History  Administered Date(s) Administered  . Influenza Split 08/19/2012  . Influenza,inj,Quad PF,36+ Mos 01/10/2014, 11/14/2014  . Pneumococcal Polysaccharide-23 08/19/2012  . Tdap 01/10/2014   BP Readings from Last 3 Encounters:  03/17/17 (!) 142/83  09/17/16 134/64  09/13/16 (!) 144/116   Wt Readings from Last 3 Encounters:  03/17/17 175 lb 3.2 oz (79.5 kg)  09/17/16 175 lb (79.4 kg)  11/26/15 163 lb (73.9 kg)    Review of Systems  Constitutional: Negative for chills, diaphoresis, fatigue and fever.  HENT: Negative for congestion, ear pain, postnasal drip, rhinorrhea and sore throat.   Eyes: Negative for visual disturbance.  Respiratory: Negative for cough and shortness of breath.   Cardiovascular: Negative for chest pain, palpitations and leg swelling.  Gastrointestinal: Negative for abdominal pain, constipation, diarrhea, nausea and vomiting.  Endocrine: Negative for cold intolerance, heat intolerance, polydipsia, polyphagia and polyuria.  Neurological: Positive for headaches. Negative for dizziness, tremors, seizures, syncope,  facial asymmetry, speech difficulty, weakness, light-headedness and numbness.  Psychiatric/Behavioral: Negative for dysphoric mood, self-injury and sleep disturbance. The patient is nervous/anxious.     Past Medical History:  Diagnosis Date  . Anxiety   . Depression   . Hyperlipidemia   . Hypertension   . Thyroid disease    Past Surgical History:  Procedure Laterality Date  . CHOLECYSTECTOMY    . COLON SURGERY     diverticulitis with perforation; s/p colon resection.  . Colonoscopy    . TUBAL LIGATION     No Known Allergies  Social History   Social History  . Marital status: Married    Spouse name: N/A  . Number of children: N/A  . Years of education: N/A   Occupational History  . Not on file.   Social History Main Topics  . Smoking status: Current Every Day Smoker    Packs/day: 0.75    Years: 38.00    Types: Cigarettes  . Smokeless tobacco: Never Used  . Alcohol use No  . Drug use: No  . Sexual activity: Yes   Other Topics Concern  . 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 Cancer Mother     unknown primary  . Hyperlipidemia Mother  Objective:    BP (!) 142/83 (BP Location: Right Arm, Patient Position: Sitting, Cuff Size: Large)   Pulse 82   Temp 98.2 F (36.8 C) (Oral)   Resp 16   Ht 5' (1.524 m)   Wt 175 lb 3.2 oz (79.5 kg)   SpO2 96%   BMI 34.22 kg/m  Physical Exam  Constitutional: She is oriented to person, place, and time. She appears well-developed and well-nourished. No distress.  HENT:  Head: Normocephalic and atraumatic.  Right Ear: External ear normal.  Left Ear: External ear normal.  Nose: Nose normal.  Mouth/Throat: Oropharynx is clear and moist.  Eyes: Conjunctivae and EOM are  normal. Pupils are equal, round, and reactive to light.  Neck: Normal range of motion. Neck supple. Carotid bruit is not present. No thyromegaly present.  Cardiovascular: Normal rate, regular rhythm, normal heart sounds and intact distal pulses.  Exam reveals no gallop and no friction rub.   No murmur heard. Pulmonary/Chest: Effort normal and breath sounds normal. She has no wheezes. She has no rales.  Abdominal: Soft. Bowel sounds are normal. She exhibits no distension and no mass. There is no tenderness. There is no rebound and no guarding.  Lymphadenopathy:    She has no cervical adenopathy.  Neurological: She is alert and oriented to person, place, and time. No cranial nerve deficit.  Skin: Skin is warm and dry. No rash noted. She is not diaphoretic. No erythema. No pallor.  Psychiatric: She has a normal mood and affect. Her behavior is normal.        Assessment & Plan:   1. Essential hypertension, benign   2. Hypothyroidism due to acquired atrophy of thyroid   3. Generalized anxiety disorder   4. Pure hypercholesterolemia   5. Screening for HIV (human immunodeficiency virus)   6. Need for hepatitis C screening test   7. Exposure to strep throat   8. Colon cancer screening     -controlled hypertension; obtain labs; refills provided. -anxiety has improved with Cymbalta; doing well. -recnet exposure to strep throat; rapid strep negative; send throat culture. -obtain age appropriate screening labs. -refer for colonoscopy.  Orders Placed This Encounter  Procedures  . Culture, Group A Strep    Order Specific Question:   Source    Answer:   oropharynx  . CBC with Differential/Platelet  . Comprehensive metabolic panel    Order Specific Question:   Has the patient fasted?    Answer:   Yes  . Lipid panel    Order Specific Question:   Has the patient fasted?    Answer:   Yes  . TSH  . T4, free  . HIV antibody  . Hepatitis C antibody  . Ambulatory referral to  Gastroenterology    Referral Priority:   Routine    Referral Type:   Consultation    Referral Reason:   Specialty Services Required    Number of Visits Requested:   1  . POCT urinalysis dipstick  . POCT rapid strep A   Meds ordered this encounter  Medications  . DULoxetine (CYMBALTA) 60 MG capsule    Sig: Take 2 capsules (120 mg total) by mouth daily.    Dispense:  180 capsule    Refill:  1  . levothyroxine (SYNTHROID, LEVOTHROID) 88 MCG tablet    Sig: TAKE 1 TABLET (88 MCG TOTAL) BY MOUTH DAILY BEFORE BREAKFAST.    Dispense:  90 tablet    Refill:  1  . metoprolol (LOPRESSOR) 50  MG tablet    Sig: Take 1 tablet (50 mg total) by mouth 2 (two) times daily.    Dispense:  180 tablet    Refill:  1    PATIENT IS REQUESTING A 90 DAY SUPPLY RX INSTEAD  . pravastatin (PRAVACHOL) 40 MG tablet    Sig: Take 1 tablet (40 mg total) by mouth daily.    Dispense:  90 tablet    Refill:  3    No Follow-up on file.   Jalah Warmuth Paulita Fujita, M.D. Primary Care at [facility] previously [facility] [address] [place], [place]  [address] [phone] phone [phone] fax

## 2017-03-18 ENCOUNTER — Telehealth: Payer: Self-pay | Admitting: Family Medicine

## 2017-03-18 LAB — COMPREHENSIVE METABOLIC PANEL
ALT: 24 IU/L (ref 0–32)
AST: 32 IU/L (ref 0–40)
Albumin/Globulin Ratio: 1.2 (ref 1.2–2.2)
Albumin: 4.2 g/dL (ref 3.6–4.8)
Alkaline Phosphatase: 90 IU/L (ref 39–117)
BUN/Creatinine Ratio: 11 — ABNORMAL LOW (ref 12–28)
BUN: 8 mg/dL (ref 8–27)
Bilirubin Total: 0.5 mg/dL (ref 0.0–1.2)
CO2: 26 mmol/L (ref 18–29)
Calcium: 9.7 mg/dL (ref 8.7–10.3)
Chloride: 96 mmol/L (ref 96–106)
Creatinine, Ser: 0.73 mg/dL (ref 0.57–1.00)
GFR calc Af Amer: 102 mL/min/{1.73_m2} (ref >59)
GFR calc non Af Amer: 89 mL/min/{1.73_m2} (ref >59)
Globulin, Total: 3.4 g/dL (ref 1.5–4.5)
Glucose: 195 mg/dL — ABNORMAL HIGH (ref 65–99)
Potassium: 4.4 mmol/L (ref 3.5–5.2)
Sodium: 138 mmol/L (ref 134–144)
Total Protein: 7.6 g/dL (ref 6.0–8.5)

## 2017-03-18 LAB — HEPATITIS C ANTIBODY: Hep C Virus Ab: <0.1 s/co ratio (ref 0.0–0.9)

## 2017-03-18 LAB — CBC WITH DIFFERENTIAL/PLATELET
Basophils Absolute: 0.1 10*3/uL (ref 0.0–0.2)
Basos: 1 %
EOS (ABSOLUTE): 0.4 10*3/uL (ref 0.0–0.4)
Eos: 4 %
Hematocrit: 43.9 % (ref 34.0–46.6)
Hemoglobin: 15.1 g/dL (ref 11.1–15.9)
Immature Grans (Abs): 0 10*3/uL (ref 0.0–0.1)
Immature Granulocytes: 0 %
Lymphocytes Absolute: 4.4 10*3/uL — ABNORMAL HIGH (ref 0.7–3.1)
Lymphs: 45 %
MCH: 30.1 pg (ref 26.6–33.0)
MCHC: 34.4 g/dL (ref 31.5–35.7)
MCV: 88 fL (ref 79–97)
Monocytes Absolute: 0.6 10*3/uL (ref 0.1–0.9)
Monocytes: 6 %
Neutrophils Absolute: 4.4 10*3/uL (ref 1.4–7.0)
Neutrophils: 44 %
Platelets: 229 10*3/uL (ref 150–379)
RBC: 5.01 x10E6/uL (ref 3.77–5.28)
RDW: 13.3 % (ref 12.3–15.4)
WBC: 9.9 10*3/uL (ref 3.4–10.8)

## 2017-03-18 LAB — LIPID PANEL
Chol/HDL Ratio: 4.3 ratio (ref 0.0–4.4)
Cholesterol, Total: 170 mg/dL (ref 100–199)
HDL: 40 mg/dL (ref >39)
LDL Calculated: 90 mg/dL (ref 0–99)
Triglycerides: 198 mg/dL — ABNORMAL HIGH (ref 0–149)
VLDL Cholesterol Cal: 40 mg/dL (ref 5–40)

## 2017-03-18 LAB — TSH: TSH: 0.654 u[IU]/mL (ref 0.450–4.500)

## 2017-03-18 LAB — HIV ANTIBODY (ROUTINE TESTING W REFLEX): HIV Screen 4th Generation wRfx: NONREACTIVE

## 2017-03-18 LAB — T4, FREE: Free T4: 1.33 ng/dL (ref 0.82–1.77)

## 2017-03-18 NOTE — Telephone Encounter (Signed)
Pt is wanting to know if her strep test results are back from yesterday   Best number [phone]

## 2017-03-19 LAB — CULTURE, GROUP A STREP: Strep A Culture: NEGATIVE

## 2017-03-19 NOTE — Telephone Encounter (Signed)
Still in process

## 2017-03-23 NOTE — Telephone Encounter (Signed)
Results negative. 

## 2017-03-23 NOTE — Telephone Encounter (Signed)
Labs are all normal except for elevated sugar level at 195.  Was she fasting at her visit?  I recommend weight loss, exercise, and low-sugar low-carbohydrate foods.  She needs to be fasting at her next visit.

## 2017-03-23 NOTE — Telephone Encounter (Signed)
Pt requesting lab results Provided patient with all negative testing Please advise

## 2017-03-25 NOTE — Telephone Encounter (Signed)
Called the patient, informed of results, advised to be fasting for the next visit.

## 2017-03-27 ENCOUNTER — Other Ambulatory Visit: Payer: Self-pay | Admitting: Physician Assistant

## 2017-03-29 ENCOUNTER — Other Ambulatory Visit: Payer: Self-pay | Admitting: Physician Assistant

## 2017-04-21 ENCOUNTER — Encounter: Payer: Self-pay | Admitting: Family Medicine

## 2017-08-11 ENCOUNTER — Ambulatory Visit: Payer: Commercial Managed Care - HMO | Admitting: Family Medicine

## 2017-08-11 ENCOUNTER — Telehealth: Payer: Self-pay | Admitting: Family Medicine

## 2017-08-11 NOTE — Telephone Encounter (Signed)
Pt would like a CB from Dr. Katrinka Blazing. Pt's appointment on 08/11/17 was at 5, she showed up at 5:16. I EODed her, she was not happy. I tried to talk nicely with her, but she wasn't having it. Please advise at [phone]

## 2017-08-17 NOTE — Telephone Encounter (Signed)
See message form Darolyn Rua  I called pt to see if there was anything that I could help here with. Pt husband  states that pt was next door and that he will let her know that "we" called.Please advise

## 2017-08-23 DIAGNOSIS — E119 Type 2 diabetes mellitus without complications: Secondary | ICD-10-CM | POA: Diagnosis not present

## 2017-08-23 DIAGNOSIS — J989 Respiratory disorder, unspecified: Secondary | ICD-10-CM | POA: Diagnosis not present

## 2017-08-23 DIAGNOSIS — J441 Chronic obstructive pulmonary disease with (acute) exacerbation: Secondary | ICD-10-CM | POA: Diagnosis not present

## 2017-08-24 ENCOUNTER — Ambulatory Visit: Payer: Commercial Managed Care - HMO | Admitting: Family Medicine

## 2017-08-26 ENCOUNTER — Other Ambulatory Visit: Payer: Self-pay | Admitting: Family Medicine

## 2017-08-26 ENCOUNTER — Ambulatory Visit
Admission: RE | Admit: 2017-08-26 | Discharge: 2017-08-26 | Disposition: A | Discharge status: Home / Self Care | Payer: 59 | Source: Ambulatory Visit | Attending: Family Medicine | Admitting: Family Medicine

## 2017-08-26 DIAGNOSIS — R634 Abnormal weight loss: Secondary | ICD-10-CM

## 2017-08-26 DIAGNOSIS — R05 Cough: Secondary | ICD-10-CM | POA: Diagnosis not present

## 2017-09-01 DIAGNOSIS — E119 Type 2 diabetes mellitus without complications: Secondary | ICD-10-CM | POA: Diagnosis not present

## 2017-09-03 DIAGNOSIS — E119 Type 2 diabetes mellitus without complications: Secondary | ICD-10-CM | POA: Diagnosis not present

## 2017-09-14 ENCOUNTER — Telehealth: Payer: Self-pay

## 2017-09-14 NOTE — Telephone Encounter (Signed)
Tried to call patient to review overdue health maintenance, however, patient's phone was out of service.

## 2017-09-15 ENCOUNTER — Encounter: Payer: Self-pay | Admitting: Family Medicine

## 2017-09-15 ENCOUNTER — Ambulatory Visit (INDEPENDENT_AMBULATORY_CARE_PROVIDER_SITE_OTHER): Payer: 59 | Admitting: Family Medicine

## 2017-09-15 VITALS — BP 142/88 | HR 92 | Temp 98.0°F | Resp 16 | Ht 59.84 in | Wt 150.0 lb

## 2017-09-15 DIAGNOSIS — E559 Vitamin D deficiency, unspecified: Secondary | ICD-10-CM

## 2017-09-15 DIAGNOSIS — Z1231 Encounter for screening mammogram for malignant neoplasm of breast: Secondary | ICD-10-CM

## 2017-09-15 DIAGNOSIS — E034 Atrophy of thyroid (acquired): Secondary | ICD-10-CM

## 2017-09-15 DIAGNOSIS — Z23 Encounter for immunization: Secondary | ICD-10-CM | POA: Diagnosis not present

## 2017-09-15 DIAGNOSIS — F411 Generalized anxiety disorder: Secondary | ICD-10-CM

## 2017-09-15 DIAGNOSIS — J0100 Acute maxillary sinusitis, unspecified: Secondary | ICD-10-CM

## 2017-09-15 DIAGNOSIS — R634 Abnormal weight loss: Secondary | ICD-10-CM

## 2017-09-15 DIAGNOSIS — Z1211 Encounter for screening for malignant neoplasm of colon: Secondary | ICD-10-CM | POA: Diagnosis not present

## 2017-09-15 DIAGNOSIS — Z1239 Encounter for other screening for malignant neoplasm of breast: Secondary | ICD-10-CM

## 2017-09-15 DIAGNOSIS — E7800 Pure hypercholesterolemia, unspecified: Secondary | ICD-10-CM

## 2017-09-15 DIAGNOSIS — J9801 Acute bronchospasm: Secondary | ICD-10-CM

## 2017-09-15 DIAGNOSIS — I10 Essential (primary) hypertension: Secondary | ICD-10-CM

## 2017-09-15 DIAGNOSIS — E119 Type 2 diabetes mellitus without complications: Secondary | ICD-10-CM

## 2017-09-15 LAB — POCT URINALYSIS DIP (MANUAL ENTRY)
Bilirubin, UA: NEGATIVE
Glucose, UA: >=1000 mg/dL — AB
Ketones, POC UA: NEGATIVE mg/dL
Leukocytes, UA: NEGATIVE
Nitrite, UA: NEGATIVE
Protein Ur, POC: NEGATIVE mg/dL
Spec Grav, UA: 1.01 (ref 1.010–1.025)
Urobilinogen, UA: 0.2 E.U./dL
pH, UA: 6 (ref 5.0–8.0)

## 2017-09-15 LAB — POCT GLYCOSYLATED HEMOGLOBIN (HGB A1C): Hemoglobin A1C: >14

## 2017-09-15 LAB — GLUCOSE, POCT (MANUAL RESULT ENTRY): POC Glucose: 320 mg/dl — AB (ref 70–99)

## 2017-09-15 MED ORDER — GLIPIZIDE ER 10 MG PO TB24: 10.0000 mg | ORAL_TABLET | Freq: Every day | ORAL | 3 refills | Status: DC

## 2017-09-15 MED ORDER — DULOXETINE HCL 60 MG PO CPEP: 120.0000 mg | ORAL_CAPSULE | Freq: Every day | ORAL | 1 refills | Status: DC

## 2017-09-15 MED ORDER — LEVOTHYROXINE SODIUM 88 MCG PO TABS: ORAL_TABLET | ORAL | 1 refills | Status: DC

## 2017-09-15 MED ORDER — PRAVASTATIN SODIUM 40 MG PO TABS: 40.0000 mg | ORAL_TABLET | Freq: Every day | ORAL | 3 refills | Status: DC

## 2017-09-15 MED ORDER — CEFDINIR 250 MG/5ML PO SUSR: 300.0000 mg | Freq: Two times a day (BID) | ORAL | 0 refills | Status: DC

## 2017-09-15 MED ORDER — METOPROLOL TARTRATE 50 MG PO TABS: 50.0000 mg | ORAL_TABLET | Freq: Two times a day (BID) | ORAL | 1 refills | Status: DC

## 2017-09-15 NOTE — Progress Notes (Signed)
   Subjective:    Patient ID: Felicia Hanson, female    DOB: 1956-01-16, 61 y.o.   MRN: [id number]  HPI    Review of Systems  Constitutional: Positive for diaphoresis and fatigue.  HENT: Positive for congestion, dental problem, hearing loss, mouth sores, sinus pressure and voice change.   Eyes: Positive for visual disturbance.  Respiratory: Positive for wheezing.   Cardiovascular: Positive for palpitations.  Gastrointestinal: Positive for blood in stool.  Endocrine: Positive for heat intolerance, polydipsia and polyuria.  Genitourinary: Positive for dyspareunia and urgency.  Allergic/Immunologic: Positive for environmental allergies.  Psychiatric/Behavioral: Positive for sleep disturbance. The patient is nervous/anxious.        Objective:   Physical Exam        Assessment & Plan:

## 2017-09-15 NOTE — Progress Notes (Signed)
Subjective:    Patient ID: Felicia Hanson, female    DOB: 12/16/1955, 61 y.o.   MRN: [id number]  09/15/2017  Annual Exam    HPI This 61 y.o. female presents for new onset diabetes and recent bronchitis with sinusitis.   Chest congestion: onset eight weeks ago.  Four grandchildren also sick; onset 07/31/17.  Patient still with horrible cough with sputum production; using Mucinex.  +horrible nasal congestion.  Started on Doxycycline for two weeks by NP.  Underwent CXR and negative.  Prescribed inhaler but did not use; afraid of tachycardia  Weight loss: started losing weight in July; every day started losing a pound; then increased to 1-2 days.  Evaluated by Dr. Jeannetta NapElkins office; sugar 436; prescribed [place].  Craving water.  Losing weater.    DMII: fasting sugars 312.  Highest reading 435.  Has given self two injections so far.  Nocturia x B: cereal or egg/bacon or waffles.   Colon cancer screening: having a little blood in stool with wiping.  Some diarrhea this week.    Visual Acuity Screening   Right eye Left eye Both eyes  Without correction: 20/40 20/40 20/40   With correction:       BP Readings from Last 3 Encounters:  09/15/17 (!) 142/88  03/17/17 (!) 142/83  09/17/16 134/64   Wt Readings from Last 3 Encounters:  09/15/17 150 lb (68 kg)  03/17/17 175 lb 3.2 oz (79.5 kg)  09/17/16 175 lb (79.4 kg)   Immunization History  Administered Date(s) Administered  . Influenza Split 08/19/2012  . Influenza,inj,Quad PF,6+ Mos 01/10/2014, 11/14/2014  . Pneumococcal Polysaccharide-23 08/19/2012  . Tdap 01/10/2014    Review of Systems  Constitutional: Positive for unexpected weight change. Negative for activity change, appetite change, chills, diaphoresis, fatigue and fever.  HENT: Positive for congestion, postnasal drip and rhinorrhea. Negative for dental problem, drooling, ear discharge, ear pain, facial swelling, hearing loss, mouth sores, nosebleeds, sinus pressure,  sneezing, sore throat, tinnitus, trouble swallowing and voice change.   Eyes: Negative for photophobia, pain, discharge, redness, itching and visual disturbance.  Respiratory: Positive for cough, shortness of breath and wheezing. Negative for apnea, choking, chest tightness and stridor.   Cardiovascular: Negative for chest pain, palpitations and leg swelling.  Gastrointestinal: Negative for abdominal distention, abdominal pain, anal bleeding, blood in stool, constipation, diarrhea, nausea, rectal pain and vomiting.  Endocrine: Positive for polydipsia and polyuria. Negative for cold intolerance, heat intolerance and polyphagia.  Genitourinary: Negative for decreased urine volume, difficulty urinating, dyspareunia, dysuria, enuresis, flank pain, frequency, genital sores, hematuria, menstrual problem, pelvic pain, urgency, vaginal bleeding, vaginal discharge and vaginal pain.  Musculoskeletal: Negative for arthralgias, back pain, gait problem, joint swelling, myalgias, neck pain and neck stiffness.  Skin: Negative for color change, pallor, rash and wound.  Allergic/Immunologic: Negative for environmental allergies, food allergies and immunocompromised state.  Neurological: Negative for dizziness, tremors, seizures, syncope, facial asymmetry, speech difficulty, weakness, light-headedness, numbness and headaches.  Hematological: Negative for adenopathy. Does not bruise/bleed easily.  Psychiatric/Behavioral: Negative for agitation, behavioral problems, confusion, decreased concentration, dysphoric mood, hallucinations, self-injury, sleep disturbance and suicidal ideas. The patient is not nervous/anxious and is not hyperactive.     Past Medical History:  Diagnosis Date  . Anxiety   . Depression   . Hyperlipidemia   . Hypertension   . Thyroid disease    Past Surgical History:  Procedure Laterality Date  . CHOLECYSTECTOMY    . COLON SURGERY     diverticulitis with perforation; s/p  colon resection.    . Colonoscopy    . TUBAL LIGATION     No Known Allergies Current Outpatient Prescriptions on File Prior to Visit  Medication Sig Dispense Refill  . ALPRAZolam (XANAX) 1 MG tablet Take 0.5 mg by mouth 3 (three) times daily as needed for anxiety or sleep.     Marland Kitchen amphetamine-dextroamphetamine (ADDERALL) 20 MG tablet Reported on 11/26/2015  0  . aspirin 81 MG tablet Take 81 mg by mouth daily.     No current facility-administered medications on file prior to visit.    Social History   Social History  . Marital status: Married    Spouse name: N/A  . Number of children: N/A  . Years of education: N/A   Occupational History  . Not on file.   Social History Main Topics  . Smoking status: Current Every Day Smoker    Packs/day: 0.75    Years: 38.00    Types: Cigarettes  . Smokeless tobacco: Never Used  . Alcohol use No  . Drug use: No  . Sexual activity: Yes   Other Topics Concern  . 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 Cancer Mother        unknown primary  . Hyperlipidemia Mother        Objective:    BP (!) 142/88   Pulse 92   Temp 98 F (36.7 C) (Oral)   Resp 16   Ht 4' 11.84" (1.52 m)   Wt 150 lb (68 kg)   SpO2 95%   BMI 29.45 kg/m  Physical Exam  Constitutional: She is oriented to person, place, and time. She appears well-developed and well-nourished. No distress.  HENT:  Head: Normocephalic and atraumatic.  Right Ear: External ear normal.  Left Ear: External ear normal.  Nose: Nose normal.  Mouth/Throat: Oropharynx is clear and moist.  Eyes: Pupils are equal, round, and reactive to light. Conjunctivae and EOM are normal.  Neck: Normal range of motion and full passive range of motion without pain. Neck supple.  No JVD present. Carotid bruit is not present. No thyromegaly present.  Cardiovascular: Normal rate, regular rhythm and normal heart sounds.  Exam reveals no gallop and no friction rub.   No murmur heard. Pulmonary/Chest: Effort normal and breath sounds normal. She has no wheezes. She has no rales.  Abdominal: Soft. Bowel sounds are normal. She exhibits no distension and no mass. There is no tenderness. There is no rebound and no guarding.  Musculoskeletal:       Right shoulder: Normal.       Left shoulder: Normal.       Cervical back: Normal.  Lymphadenopathy:    She has no cervical adenopathy.  Neurological: She is alert and oriented to person, place, and time. She has normal reflexes. No cranial nerve deficit. She exhibits normal muscle tone. Coordination normal.  Skin: Skin is warm and dry. No rash noted. She is not diaphoretic. No erythema. No pallor.  Psychiatric: She has a normal mood and affect. Her behavior is normal. Judgment and thought content normal.  Nursing note and vitals reviewed.  No results found. Depression screen [facility] 2/9 09/15/2017 03/17/2017 09/17/2016 11/26/2015 08/09/2015  Decreased Interest 0 0 0 0 1  Down, Depressed, Hopeless 0 0 1 0 1  PHQ - 2 Score 0 0 1 0 2  Altered sleeping - - - - 3  Tired, decreased energy - - - - 1  Change in appetite - - - - 0  Feeling bad or failure about yourself  - - - - 2  Trouble concentrating - - - - 0  Moving slowly or fidgety/restless - - - - 0  Suicidal thoughts - - - - 0  PHQ-9 Score - - - - 8   Fall Risk  09/15/2017 03/17/2017 08/09/2015 11/14/2014  Falls in the past year? No No No No        Assessment & Plan:   1. Type 2 diabetes mellitus without complication, without long-term current use of insulin (HCC)   2. Essential hypertension, benign   3. Hypothyroidism due to acquired atrophy of thyroid   4. Pure hypercholesterolemia   5. Generalized anxiety disorder   6. Vitamin D deficiency   7. Unintentional weight loss     8. Screening for breast cancer   9. Colon cancer screening   10. Need for prophylactic vaccination and inoculation against influenza   11. Acute non-recurrent maxillary sinusitis   12. Bronchospasm     -New onset DMII which is etiology to unintentional weight loss. Refer for diabetic education.   Intolerant to Metformin; tolerating Bydureon. Will likely warrant second agent for diabetes.   I recommend weight loss, exercise, and low-carbohydrate low-sugar food choices. You should AVOID: regular sodas, sweetened tea, fruit juices.  You should LIMIT: breads, pastas, rice, potatoes, and desserts/sweets.  I would recommend limiting your total carbohydrate intake per meal to 45 grams; I would limit your total carbohydrate intake per snack to 30 grams.  I would also have a goal of 60 grams of protein intake per day; this would equal 10-15 grams of protein per meal and 5-10 grams of protein per snack. -new onset sinusitis with bronchospasm; rx for Omnicef provided.  Restart Albuterol tid; start Claritin or Allegra.  -obtain labs for chronic disease management; refills provided. -refer for mammogram and colonoscopy; will defer CPE until chronic medical conditions have stabilized. -prolonged face-to-face for 40 minutes with greater than 50% of time dedicated to counseling and coordination of care.   Orders Placed This Encounter  Procedures  . MM DIGITAL SCREENING BILATERAL    Pf ; pt is unsure of when lasty mammo was done No problems / no implants / no hz br ca / no needs / [employer] w pt 2D    Standing Status:   Future    Standing Expiration Date:   11/15/2018    Order Specific Question:   Reason for Exam (SYMPTOM  OR DIAGNOSIS REQUIRED)    Answer:   screening for breast cancer    Order Specific Question:   Preferred imaging location?    Answer:   [facility]  . CBC with Differential/Platelet  . Comprehensive metabolic panel    Order Specific Question:   Has the patient fasted?     Answer:   No  . Hemoglobin A1c  . Lipid panel    Order Specific Question:   Has the patient fasted?    Answer:   No  . TSH  . Microalbumin / creatinine urine ratio  . Ambulatory referral to Gastroenterology    Referral Priority:  Routine    Referral Type:   Consultation    Referral Reason:   Specialty Services Required    Number of Visits Requested:   1  . Ambulatory referral to diabetic education    Referral Priority:   Routine    Referral Type:   Consultation    Referral Reason:   Specialty Services Required    Number of Visits Requested:   1  . POCT urinalysis dipstick  . POCT glucose (manual entry)  . POCT glycosylated hemoglobin (Hb A1C)   Meds ordered this encounter  Medications  . Exenatide ER (BYDUREON) 2 MG PEN    Sig: Inject into the skin.  . cefdinir (OMNICEF) 250 MG/5ML suspension    Sig: Take 6 mLs (300 mg total) by mouth 2 (two) times daily.    Dispense:  120 mL    Refill:  0  . glipiZIDE (GLUCOTROL XL) 10 MG 24 hr tablet    Sig: Take 1 tablet (10 mg total) by mouth daily with breakfast.    Dispense:  90 tablet    Refill:  3  . DULoxetine (CYMBALTA) 60 MG capsule    Sig: Take 2 capsules (120 mg total) by mouth daily.    Dispense:  180 capsule    Refill:  1  . levothyroxine (SYNTHROID, LEVOTHROID) 88 MCG tablet    Sig: TAKE 1 TABLET (88 MCG TOTAL) BY MOUTH DAILY BEFORE BREAKFAST.    Dispense:  90 tablet    Refill:  1  . metoprolol tartrate (LOPRESSOR) 50 MG tablet    Sig: Take 1 tablet (50 mg total) by mouth 2 (two) times daily.    Dispense:  180 tablet    Refill:  1  . pravastatin (PRAVACHOL) 40 MG tablet    Sig: Take 1 tablet (40 mg total) by mouth daily.    Dispense:  90 tablet    Refill:  3    Return in about 3 weeks (around 10/06/2017) for recheck cough, diabetes.   Kristi Paulita Fujita, M.D. Primary Care at [facility] previously [facility] [address] [place], [place]  [address] [phone] phone [phone] fax

## 2017-09-15 NOTE — Patient Instructions (Addendum)
   RESTART INHALER 3 TIMES PER DAY. Start Claritin or Allegra one daily.   IF you received an x-ray today, you will receive an invoice from [facility]. Please contact [facility] at [phone] with questions or concerns regarding your invoice.   IF you received labwork today, you will receive an invoice from [facility]. Please contact [facility] at [phone] with questions or concerns regarding your invoice.   Our billing staff will not be able to assist you with questions regarding bills from these companies.  You will be contacted with the lab results as soon as they are available. The fastest way to get your results is to activate your My Chart account. Instructions are located on the last page of this paperwork. If you have not heard from [place] regarding the results in 2 weeks, please contact this office.

## 2017-09-16 LAB — CBC WITH DIFFERENTIAL/PLATELET
Basophils Absolute: 0.1 10*3/uL (ref 0.0–0.2)
Basos: 1 %
EOS (ABSOLUTE): 0.2 10*3/uL (ref 0.0–0.4)
Eos: 3 %
Hematocrit: 46 % (ref 34.0–46.6)
Hemoglobin: 16 g/dL — ABNORMAL HIGH (ref 11.1–15.9)
Immature Grans (Abs): 0 10*3/uL (ref 0.0–0.1)
Immature Granulocytes: 0 %
Lymphocytes Absolute: 2.9 10*3/uL (ref 0.7–3.1)
Lymphs: 33 %
MCH: 30 pg (ref 26.6–33.0)
MCHC: 34.8 g/dL (ref 31.5–35.7)
MCV: 86 fL (ref 79–97)
Monocytes Absolute: 0.8 10*3/uL (ref 0.1–0.9)
Monocytes: 9 %
Neutrophils Absolute: 4.9 10*3/uL (ref 1.4–7.0)
Neutrophils: 54 %
Platelets: 216 10*3/uL (ref 150–379)
RBC: 5.33 x10E6/uL — ABNORMAL HIGH (ref 3.77–5.28)
RDW: 12.9 % (ref 12.3–15.4)
WBC: 8.9 10*3/uL (ref 3.4–10.8)

## 2017-09-16 LAB — LIPID PANEL
Chol/HDL Ratio: 3.3 ratio (ref 0.0–4.4)
Cholesterol, Total: 165 mg/dL (ref 100–199)
HDL: 50 mg/dL (ref >39)
LDL Calculated: 74 mg/dL (ref 0–99)
Triglycerides: 206 mg/dL — ABNORMAL HIGH (ref 0–149)
VLDL Cholesterol Cal: 41 mg/dL — ABNORMAL HIGH (ref 5–40)

## 2017-09-16 LAB — COMPREHENSIVE METABOLIC PANEL
ALT: 13 IU/L (ref 0–32)
AST: 16 IU/L (ref 0–40)
Albumin/Globulin Ratio: 1.3 (ref 1.2–2.2)
Albumin: 4.1 g/dL (ref 3.6–4.8)
Alkaline Phosphatase: 118 IU/L — ABNORMAL HIGH (ref 39–117)
BUN/Creatinine Ratio: 7 — ABNORMAL LOW (ref 12–28)
BUN: 5 mg/dL — ABNORMAL LOW (ref 8–27)
Bilirubin Total: 0.3 mg/dL (ref 0.0–1.2)
CO2: 24 mmol/L (ref 20–29)
Calcium: 9.5 mg/dL (ref 8.7–10.3)
Chloride: 97 mmol/L (ref 96–106)
Creatinine, Ser: 0.69 mg/dL (ref 0.57–1.00)
GFR calc Af Amer: 108 mL/min/{1.73_m2} (ref >59)
GFR calc non Af Amer: 94 mL/min/{1.73_m2} (ref >59)
Globulin, Total: 3.2 g/dL (ref 1.5–4.5)
Glucose: 316 mg/dL — ABNORMAL HIGH (ref 65–99)
Potassium: 3.7 mmol/L (ref 3.5–5.2)
Sodium: 138 mmol/L (ref 134–144)
Total Protein: 7.3 g/dL (ref 6.0–8.5)

## 2017-09-16 LAB — HEMOGLOBIN A1C
Est. average glucose Bld gHb Est-mCnc: 378 mg/dL
Hgb A1c MFr Bld: 14.8 % — ABNORMAL HIGH (ref 4.8–5.6)

## 2017-09-16 LAB — MICROALBUMIN / CREATININE URINE RATIO
Creatinine, Urine: 89.6 mg/dL
Microalb/Creat Ratio: 15.2 mg/g creat (ref 0.0–30.0)
Microalbumin, Urine: 13.6 ug/mL

## 2017-09-16 LAB — TSH: TSH: 0.613 u[IU]/mL (ref 0.450–4.500)

## 2017-09-17 ENCOUNTER — Other Ambulatory Visit: Payer: Self-pay | Admitting: Family Medicine

## 2017-10-04 ENCOUNTER — Ambulatory Visit
Admission: RE | Admit: 2017-10-04 | Discharge: 2017-10-04 | Disposition: A | Discharge status: Home / Self Care | Payer: 59 | Source: Ambulatory Visit | Attending: Family Medicine | Admitting: Family Medicine

## 2017-10-04 DIAGNOSIS — Z1231 Encounter for screening mammogram for malignant neoplasm of breast: Secondary | ICD-10-CM | POA: Diagnosis not present

## 2017-10-04 DIAGNOSIS — Z1239 Encounter for other screening for malignant neoplasm of breast: Secondary | ICD-10-CM

## 2017-10-06 ENCOUNTER — Ambulatory Visit: Payer: 59 | Admitting: Family Medicine

## 2017-10-06 ENCOUNTER — Other Ambulatory Visit: Payer: Self-pay

## 2017-10-06 ENCOUNTER — Encounter: Payer: Self-pay | Admitting: Family Medicine

## 2017-10-06 VITALS — BP 130/82 | HR 90 | Temp 98.8°F | Resp 16 | Ht 61.42 in | Wt 152.0 lb

## 2017-10-06 DIAGNOSIS — E119 Type 2 diabetes mellitus without complications: Secondary | ICD-10-CM | POA: Diagnosis not present

## 2017-10-06 DIAGNOSIS — J0101 Acute recurrent maxillary sinusitis: Secondary | ICD-10-CM | POA: Diagnosis not present

## 2017-10-06 DIAGNOSIS — J9801 Acute bronchospasm: Secondary | ICD-10-CM

## 2017-10-06 DIAGNOSIS — R921 Mammographic calcification found on diagnostic imaging of breast: Secondary | ICD-10-CM | POA: Diagnosis not present

## 2017-10-06 LAB — POCT URINALYSIS DIP (MANUAL ENTRY)
Bilirubin, UA: NEGATIVE
Glucose, UA: NEGATIVE mg/dL
Ketones, POC UA: NEGATIVE mg/dL
Nitrite, UA: NEGATIVE
Protein Ur, POC: NEGATIVE mg/dL
Spec Grav, UA: 1.015 (ref 1.010–1.025)
Urobilinogen, UA: 1 E.U./dL
pH, UA: 7 (ref 5.0–8.0)

## 2017-10-06 MED ORDER — LEVOFLOXACIN 750 MG PO TABS: 750.0000 mg | ORAL_TABLET | Freq: Every day | ORAL | 0 refills | Status: DC

## 2017-10-06 NOTE — Progress Notes (Signed)
Subjective:    Patient ID: Felicia Hanson, female    DOB: Apr 21, 1956, 61 y.o.   MRN: [id number]  10/06/2017  Diabetes (follow-up) and Nasal Congestion (with chest congestion )    HPI This 61 y.o. female presents for evaluation of new onset DMII and nasal congestion.   At last visit, HgbA1c was 14.0.  Called in Glipizide XL 10mg  daily; did not start it.  Sugars running 225-250.  Kept reading information about dangers of hypoglycemia.  Vision is horrible; cannot see; vision is really fuzzy.  Can barely read the second line.  Can read the third line.  Twice daily.  Avoiding soda; taking a small sip of mountain dew for headache.   Worrying patient; vision blurred is main concern. Having granddaughter drive around.    Next on list is eye appointment. Needs to get gyencologist.  Memory is horrible; brother in law is being treated for lung cancer; chemotherapy and radiation. Asbestos.   Uncle of asbestos cancer.   Mammogram with BREAST CALCIFICATIONS BILATERAL.  Needs additional imaging but has not been contacted yet.  Has not been called regarding colonoscopy yet message given to husband on 09/20/2017.  Still suffering with horrible head and chest congestion.  Green sputum.  Muffled ears.  Taking Mucinex.  S/p Omnicef and Zithromax.  Allergic to dogs.   Taking Allegra. Using inhaler; Xopenex.   BP Readings from Last 3 Encounters:  10/06/17 130/82  09/15/17 (!) 142/88  03/17/17 (!) 142/83   Wt Readings from Last 3 Encounters:  10/06/17 152 lb (68.9 kg)  09/15/17 150 lb (68 kg)  03/17/17 175 lb 3.2 oz (79.5 kg)   Immunization History  Administered Date(s) Administered  . Influenza Split 08/19/2012  . Influenza,inj,Quad PF,6+ Mos 01/10/2014, 11/14/2014  . Pneumococcal Polysaccharide-23 08/19/2012  . Tdap 01/10/2014    Review of Systems  Constitutional: Negative for chills, diaphoresis, fatigue and fever.  HENT: Positive for congestion, postnasal drip, rhinorrhea, sinus pressure,  sinus pain and voice change. Negative for ear pain, sore throat and trouble swallowing.   Eyes: Negative for visual disturbance.  Respiratory: Positive for cough and wheezing. Negative for shortness of breath.   Cardiovascular: Negative for chest pain, palpitations and leg swelling.  Gastrointestinal: Negative for abdominal pain, constipation, diarrhea, nausea and vomiting.  Endocrine: Negative for cold intolerance, heat intolerance, polydipsia, polyphagia and polyuria.  Neurological: Negative for dizziness, tremors, seizures, syncope, facial asymmetry, speech difficulty, weakness, light-headedness, numbness and headaches.  Psychiatric/Behavioral: Positive for dysphoric mood. Negative for self-injury, sleep disturbance and suicidal ideas. The patient is nervous/anxious.     Past Medical History:  Diagnosis Date  . Anxiety   . Depression   . Hyperlipidemia   . Hypertension   . Thyroid disease    Past Surgical History:  Procedure Laterality Date  . BREAST EXCISIONAL BIOPSY Left   . CHOLECYSTECTOMY    . COLON SURGERY     diverticulitis with perforation; s/p colon resection.  . Colonoscopy    . TUBAL LIGATION     No Known Allergies Current Outpatient Medications on File Prior to Visit  Medication Sig Dispense Refill  . ALPRAZolam (XANAX) 1 MG tablet Take 0.5 mg by mouth 3 (three) times daily as needed for anxiety or sleep.     Marland Kitchen amphetamine-dextroamphetamine (ADDERALL) 20 MG tablet Reported on 11/26/2015  0  . aspirin 81 MG tablet Take 81 mg by mouth daily.    . cefdinir (OMNICEF) 250 MG/5ML suspension Take 6 mLs (300 mg total) by mouth  2 (two) times daily. 120 mL 0  . DULoxetine (CYMBALTA) 60 MG capsule Take 2 capsules (120 mg total) by mouth daily. 180 capsule 1  . Exenatide ER (BYDUREON) 2 MG PEN Inject into the skin.    Marland Kitchen. glipiZIDE (GLUCOTROL XL) 10 MG 24 hr tablet Take 1 tablet (10 mg total) by mouth daily with breakfast. 90 tablet 3  . levothyroxine (SYNTHROID, LEVOTHROID) 88  MCG tablet TAKE 1 TABLET (88 MCG TOTAL) BY MOUTH DAILY BEFORE BREAKFAST. 90 tablet 1  . metoprolol tartrate (LOPRESSOR) 50 MG tablet Take 1 tablet (50 mg total) by mouth 2 (two) times daily. 180 tablet 1  . pravastatin (PRAVACHOL) 40 MG tablet Take 1 tablet (40 mg total) by mouth daily. 90 tablet 3   No current facility-administered medications on file prior to visit.    Social History   Socioeconomic History  . Marital status: Married    Spouse name: Not on file  . Number of children: Not on file  . Years of education: Not on file  . Highest education level: Not on file  Social Needs  . Financial resource strain: Not on file  . Food insecurity - worry: Not on file  . Food insecurity - inability: Not on file  . Transportation needs - medical: Not on file  . Transportation needs - non-medical: Not on file  Occupational History  . Not on file  Tobacco Use  . Smoking status: Current Every Day Smoker    Packs/day: 0.75    Years: 38.00    Pack years: 28.50    Types: Cigarettes  . Smokeless tobacco: Never Used  Substance and Sexual Activity  . Alcohol use: No    Alcohol/week: 0.0 oz  . Drug use: No  . Sexual activity: Yes  Other Topics Concern  . 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 Cancer Mother        unknown primary  . Hyperlipidemia Mother   . Breast cancer Paternal Aunt        Objective:    BP 130/82   Pulse 90   Temp 98.8 F (37.1 C) (Oral)   Resp 16   Ht 5' 1.42" (1.56 m)   Wt 152 lb (68.9 kg)   SpO2 95%   BMI 28.33 kg/m  Physical Exam  Constitutional: She is oriented to person, place, and time. She appears well-developed and well-nourished. No distress.  HENT:  Head: Normocephalic and atraumatic.  Right  Ear: Tympanic membrane, external ear and ear canal normal.  Left Ear: Tympanic membrane, external ear and ear canal normal.  Nose: Mucosal edema and rhinorrhea present. Right sinus exhibits maxillary sinus tenderness. Right sinus exhibits no frontal sinus tenderness. Left sinus exhibits maxillary sinus tenderness. Left sinus exhibits no frontal sinus tenderness.  Mouth/Throat: Oropharynx is clear and moist.  Eyes: Conjunctivae and EOM are normal. Pupils are equal, round, and reactive to light.  Neck: Normal range of motion. Neck supple. Carotid bruit is not present. No thyromegaly present.  Cardiovascular: Normal rate, regular rhythm, normal heart sounds and intact distal pulses. Exam reveals no gallop and no friction rub.  No murmur heard. Pulmonary/Chest: Effort normal and breath sounds normal. She has no wheezes. She has no rales.  Abdominal: Soft. Bowel sounds are normal. She exhibits no distension and no mass. There is no tenderness. There is no rebound and no guarding.  Lymphadenopathy:    She has no cervical adenopathy.  Neurological: She is alert and oriented to person, place, and time. No cranial nerve deficit.  Skin: Skin is warm and dry. No rash noted. She is not diaphoretic. No erythema. No pallor.  Psychiatric: She has a normal mood and affect. Her behavior is normal.   No results found. Depression screen [facility] 2/9 10/06/2017 09/15/2017 03/17/2017 09/17/2016 11/26/2015  Decreased Interest 0 0 0 0 0  Down, Depressed, Hopeless 0 0 0 1 0  PHQ - 2 Score 0 0 0 1 0  Altered sleeping - - - - -  Tired, decreased energy - - - - -  Change in appetite - - - - -  Feeling bad or failure about yourself  - - - - -  Trouble concentrating - - - - -  Moving slowly or fidgety/restless - - - - -  Suicidal thoughts - - - - -  PHQ-9 Score - - - - -   Fall Risk  10/06/2017 09/15/2017 03/17/2017 08/09/2015 11/14/2014  Falls in the past year? No No No No No        Assessment & Plan:   1. Type 2  diabetes mellitus without complication, without long-term current use of insulin (HCC)   2. Breast calcifications   3. Acute recurrent maxillary sinusitis   4. Bronchospasm    Diabetes mellitus type 2 remains uncontrolled however has improved.  Patient noncompliant with starting glipizide therapy after last visit.  Counseling provided during visit today.  Patient agreeable to start glipizide.  Also has upcoming nutrition and diabetic education counseling.  Continue to check sugars twice daily.   New onset breast calcifications and will warrant diagnostic mammogram and breast ultrasound.  Reviewed mammogram results with patient and she expressed understanding.  Persistent maxillary sinusitis with bronchospasm.  Prescription for Levaquin provided at today's visit.   Orders Placed This Encounter  Procedures  . MM DIAG BREAST TOMO BILATERAL    Standing Status:   Future    Standing Expiration Date:   12/06/2018    Order Specific Question:   Reason for Exam (SYMPTOM  OR DIAGNOSIS REQUIRED)    Answer:   B breast calcifications on mammogram screening.    Order Specific Question:   Preferred imaging location?    Answer:   [facility]  . CBC with Differential/Platelet  . Comprehensive metabolic panel  . POCT urinalysis dipstick   Meds ordered this encounter  Medications  . levofloxacin (LEVAQUIN) 750 MG tablet    Sig: Take 1 tablet (750 mg total) daily by mouth.    Dispense:  10 tablet    Refill:  0    Return in about 4 weeks (around 11/03/2017) for follow-up chronic medical conditions.   Felicia Hanson, M.D. Primary Care at [facility] previously [facility] [address] [place], [place]  [address] [phone] phone [phone] fax

## 2017-10-06 NOTE — Patient Instructions (Addendum)
Start taking Glipizide every night with supper. You will need additional mammogram views. Continue to check sugars twice daily. Please go to the nutritionist.  Diabetes Mellitus and Standards of Medical Care Managing diabetes (diabetes mellitus) can be complicated. Your diabetes treatment may be managed by a team of health care providers, including:  A diet and nutrition specialist (registered dietitian).  A nurse.  A certified diabetes educator (CDE).  A diabetes specialist (endocrinologist).  An eye doctor.  A primary care provider.  A dentist.  Your health care providers follow a schedule in order to help you get the best quality of care. The following schedule is a general guideline for your diabetes management plan. Your health care providers may also give you more specific instructions. HbA1c ( hemoglobin A1c) test This test provides information about blood sugar (glucose) control over the previous 2-3 months. It is used to check whether your diabetes management plan needs to be adjusted.  If you are meeting your treatment goals, this test is done at least 2 times a year.  If you are not meeting treatment goals or if your treatment goals have changed, this test is done 4 times a year.  Blood pressure test  This test is done at every routine medical visit. For most people, the goal is less than 130/80. Ask your health care provider what your goal blood pressure should be. Dental and eye exams  Visit your dentist two times a year.  If you have type 1 diabetes, get an eye exam 3-5 years after you are diagnosed, and then once a year after your first exam. ? If you were diagnosed with type 1 diabetes as a child, get an eye exam when you are age 84 or older and have had diabetes for 3-5 years. After the first exam, you should get an eye exam once a year.  If you have type 2 diabetes, have an eye exam as soon as you are diagnosed, and then once a year after your first  exam. Foot care exam  Visual foot exams are done at every routine medical visit. The exams check for cuts, bruises, redness, blisters, sores, or other problems with the feet.  A complete foot exam is done by your health care provider once a year. This exam includes an inspection of the structure and skin of your feet, and a check of the pulses and sensation in your feet. ? Type 1 diabetes: Get your first exam 3-5 years after diagnosis. ? Type 2 diabetes: Get your first exam as soon as you are diagnosed.  Check your feet every day for cuts, bruises, redness, blisters, or sores. If you have any of these or other problems that are not healing, contact your health care provider. Kidney function test ( urine microalbumin)  This test is done once a year. ? Type 1 diabetes: Get your first test 5 years after diagnosis. ? Type 2 diabetes: Get your first test as soon as you are diagnosed.  If you have chronic kidney disease (CKD), get a serum creatinine and estimated glomerular filtration rate (eGFR) test once a year. Lipid profile (cholesterol, HDL, LDL, triglycerides)  This test should be done when you are diagnosed with diabetes, and every 5 years after the first test. If you are on medicines to lower your cholesterol, you may need to get this test done every year. ? The goal for LDL is less than 100 mg/dL (5.5 mmol/L). If you are at high risk, the goal is less  than 70 mg/dL (3.9 mmol/L). ? The goal for HDL is 40 mg/dL (2.2 mmol/L) for men and 50 mg/dL(2.8 mmol/L) for women. An HDL cholesterol of 60 mg/dL (3.3 mmol/L) or higher gives some protection against heart disease. ? The goal for triglycerides is less than 150 mg/dL (8.3 mmol/L). Immunizations  The yearly flu (influenza) vaccine is recommended for everyone 6 months or older who has diabetes.  The pneumonia (pneumococcal) vaccine is recommended for everyone 2 years or older who has diabetes. If you are 65 or older, you may get the  pneumonia vaccine as a series of two separate shots.  The hepatitis B vaccine is recommended for adults shortly after they have been diagnosed with diabetes.  The Tdap (tetanus, diphtheria, and pertussis) vaccine should be given: ? According to normal childhood vaccination schedules, for children. ? Every 10 years, for adults who have diabetes.  The shingles vaccine is recommended for people who have had chicken pox and are 50 years or older. Mental and emotional health  Screening for symptoms of eating disorders, anxiety, and depression is recommended at the time of diagnosis and afterward as needed. If your screening shows that you have symptoms (you have a positive screening result), you may need further evaluation and be referred to a mental health care provider. Diabetes self-management education  Education about how to manage your diabetes is recommended at diagnosis and ongoing as needed. Treatment plan  Your treatment plan will be reviewed at every medical visit. Summary  Managing diabetes (diabetes mellitus) can be complicated. Your diabetes treatment may be managed by a team of health care providers.  Your health care providers follow a schedule in order to help you get the best quality of care.  Standards of care including having regular physical exams, blood tests, blood pressure monitoring, immunizations, screening tests, and education about how to manage your diabetes.  Your health care providers may also give you more specific instructions based on your individual health. This information is not intended to replace advice given to you by your health care provider. Make sure you discuss any questions you have with your health care provider. Document Released: 09/13/2009 Document Revised: 08/14/2016 Document Reviewed: 08/14/2016 Elsevier Interactive Patient Education  2018 [employer].   IF you received an x-ray today, you will receive an invoice from [facility]  Radiology. Please contact [facility] Radiology at [phone] with questions or concerns regarding your invoice.   IF you received labwork today, you will receive an invoice from [place]. Please contact [facility] at [phone] with questions or concerns regarding your invoice.   Our billing staff will not be able to assist you with questions regarding bills from these companies.  You will be contacted with the lab results as soon as they are available. The fastest way to get your results is to activate your My Chart account. Instructions are located on the last page of this paperwork. If you have not heard from [place] regarding the results in 2 weeks, please contact this office.    \

## 2017-10-07 LAB — CBC WITH DIFFERENTIAL/PLATELET
Basophils Absolute: 0.1 10*3/uL (ref 0.0–0.2)
Basos: 1 %
EOS (ABSOLUTE): 0.4 10*3/uL (ref 0.0–0.4)
Eos: 3 %
Hematocrit: 46.6 % (ref 34.0–46.6)
Hemoglobin: 16.1 g/dL — ABNORMAL HIGH (ref 11.1–15.9)
Immature Grans (Abs): 0 10*3/uL (ref 0.0–0.1)
Immature Granulocytes: 0 %
Lymphocytes Absolute: 4.2 10*3/uL — ABNORMAL HIGH (ref 0.7–3.1)
Lymphs: 34 %
MCH: 29.8 pg (ref 26.6–33.0)
MCHC: 34.5 g/dL (ref 31.5–35.7)
MCV: 86 fL (ref 79–97)
Monocytes Absolute: 0.9 10*3/uL (ref 0.1–0.9)
Monocytes: 7 %
Neutrophils Absolute: 6.9 10*3/uL (ref 1.4–7.0)
Neutrophils: 55 %
Platelets: 269 10*3/uL (ref 150–379)
RBC: 5.4 x10E6/uL — ABNORMAL HIGH (ref 3.77–5.28)
RDW: 12.6 % (ref 12.3–15.4)
WBC: 12.5 10*3/uL — ABNORMAL HIGH (ref 3.4–10.8)

## 2017-10-07 LAB — COMPREHENSIVE METABOLIC PANEL
ALT: 17 IU/L (ref 0–32)
AST: 26 IU/L (ref 0–40)
Albumin/Globulin Ratio: 1.4 (ref 1.2–2.2)
Albumin: 4.3 g/dL (ref 3.6–4.8)
Alkaline Phosphatase: 102 IU/L (ref 39–117)
BUN/Creatinine Ratio: 7 — ABNORMAL LOW (ref 12–28)
BUN: 5 mg/dL — ABNORMAL LOW (ref 8–27)
Bilirubin Total: 0.7 mg/dL (ref 0.0–1.2)
CO2: 28 mmol/L (ref 20–29)
Calcium: 9.6 mg/dL (ref 8.7–10.3)
Chloride: 102 mmol/L (ref 96–106)
Creatinine, Ser: 0.76 mg/dL (ref 0.57–1.00)
GFR calc Af Amer: 97 mL/min/{1.73_m2} (ref >59)
GFR calc non Af Amer: 84 mL/min/{1.73_m2} (ref >59)
Globulin, Total: 3.1 g/dL (ref 1.5–4.5)
Glucose: 131 mg/dL — ABNORMAL HIGH (ref 65–99)
Potassium: 5.1 mmol/L (ref 3.5–5.2)
Sodium: 148 mmol/L — ABNORMAL HIGH (ref 134–144)
Total Protein: 7.4 g/dL (ref 6.0–8.5)

## 2017-10-08 ENCOUNTER — Encounter: Payer: Self-pay | Admitting: Family Medicine

## 2017-10-08 ENCOUNTER — Telehealth: Payer: Self-pay | Admitting: Family Medicine

## 2017-10-08 ENCOUNTER — Telehealth: Payer: Self-pay

## 2017-10-08 ENCOUNTER — Other Ambulatory Visit: Payer: Self-pay | Admitting: Family Medicine

## 2017-10-08 DIAGNOSIS — R921 Mammographic calcification found on diagnostic imaging of breast: Secondary | ICD-10-CM

## 2017-10-08 NOTE — Telephone Encounter (Signed)
PC to pt. to answer question about receiving flu vaccine.  Reported she has been sick with bronchitis and now sinusitis, and on 3rd round of antibiotic.  Questions if she can get her flu vaccine.    Advised pt. That she should not get the flu vaccine until she is feeling much better, and her sinusitis has cleared.  Verb. Understanding.     Copied from CRM #[id number]. Topic: Inquiry >> Oct 07, 2017 12:53 PM Everardo Pacific, [place] wrote: Reason for PIR:JJOACZY would like to know what her blood type is. Also she would like to know when it would be safe for her to get a flu shot. If someone could give her a call back about this matter  >> Oct 08, 2017  3:59 PM Gerrianne Scale wrote: Pt stating that she just wanted to know if she can get the flu shot while this is her 3rd dose of antibiotic

## 2017-10-08 NOTE — Telephone Encounter (Signed)
Copied from CRM #[id number]. Topic: Inquiry >> Oct 07, 2017 12:53 PM Everardo Pacific, [place] wrote: Reason for PJK:DTOIZTI would like to know what her blood type is. Also she would like to know when it would be safe for her to get a flu shot. If someone could give her a call back about this matter

## 2017-10-13 ENCOUNTER — Other Ambulatory Visit: Payer: 59

## 2017-10-18 DIAGNOSIS — E113211 Type 2 diabetes mellitus with mild nonproliferative diabetic retinopathy with macular edema, right eye: Secondary | ICD-10-CM | POA: Diagnosis not present

## 2017-10-18 DIAGNOSIS — H01001 Unspecified blepharitis right upper eyelid: Secondary | ICD-10-CM | POA: Diagnosis not present

## 2017-10-18 DIAGNOSIS — H2513 Age-related nuclear cataract, bilateral: Secondary | ICD-10-CM | POA: Diagnosis not present

## 2017-10-18 DIAGNOSIS — H35033 Hypertensive retinopathy, bilateral: Secondary | ICD-10-CM | POA: Diagnosis not present

## 2017-10-26 DIAGNOSIS — E119 Type 2 diabetes mellitus without complications: Secondary | ICD-10-CM | POA: Insufficient documentation

## 2017-10-26 DIAGNOSIS — E118 Type 2 diabetes mellitus with unspecified complications: Secondary | ICD-10-CM | POA: Insufficient documentation

## 2017-10-27 ENCOUNTER — Ambulatory Visit
Admission: RE | Admit: 2017-10-27 | Discharge: 2017-10-27 | Disposition: A | Discharge status: Home / Self Care | Payer: 59 | Source: Ambulatory Visit | Attending: Family Medicine | Admitting: Family Medicine

## 2017-10-27 ENCOUNTER — Other Ambulatory Visit: Payer: Self-pay | Admitting: Family Medicine

## 2017-10-27 ENCOUNTER — Ambulatory Visit: Admission: RE | Admit: 2017-10-27 | Payer: 59 | Source: Ambulatory Visit

## 2017-10-27 ENCOUNTER — Ambulatory Visit: Payer: 59

## 2017-10-27 DIAGNOSIS — R921 Mammographic calcification found on diagnostic imaging of breast: Secondary | ICD-10-CM | POA: Diagnosis not present

## 2017-10-28 ENCOUNTER — Ambulatory Visit: Payer: 59

## 2017-11-04 ENCOUNTER — Ambulatory Visit: Payer: 59

## 2017-11-08 ENCOUNTER — Ambulatory Visit: Payer: 59 | Admitting: Family Medicine

## 2017-11-11 ENCOUNTER — Ambulatory Visit: Payer: 59

## 2017-11-12 ENCOUNTER — Ambulatory Visit: Payer: 59 | Admitting: Family Medicine

## 2017-11-15 ENCOUNTER — Encounter: Payer: Self-pay | Admitting: Family Medicine

## 2017-11-15 ENCOUNTER — Ambulatory Visit (INDEPENDENT_AMBULATORY_CARE_PROVIDER_SITE_OTHER): Payer: 59 | Admitting: Family Medicine

## 2017-11-15 ENCOUNTER — Other Ambulatory Visit: Payer: Self-pay

## 2017-11-15 VITALS — BP 136/82 | HR 85 | Temp 97.7°F | Resp 18 | Ht 60.32 in | Wt 154.2 lb

## 2017-11-15 DIAGNOSIS — Z23 Encounter for immunization: Secondary | ICD-10-CM

## 2017-11-15 DIAGNOSIS — E119 Type 2 diabetes mellitus without complications: Secondary | ICD-10-CM | POA: Diagnosis not present

## 2017-11-15 DIAGNOSIS — I10 Essential (primary) hypertension: Secondary | ICD-10-CM | POA: Diagnosis not present

## 2017-11-15 DIAGNOSIS — J22 Unspecified acute lower respiratory infection: Secondary | ICD-10-CM | POA: Diagnosis not present

## 2017-11-15 NOTE — Progress Notes (Signed)
Subjective:    Patient ID: Felicia Hanson, female    DOB: 11-29-1956, 61 y.o.   MRN: [id number]  11/15/2017  Diabetes; Hypertension; Hypothyroidism; Depression; and Bronchitis    HPI This 61 y.o. female presents for one month follow-up evaluation of DMII, acute bronchitis.  Sugars are 109, 103.  Checking sugars twice daily.  Highest is 135.   Checks fasting and post-prandial.  Before 1:00pm or after 1:00pm; before dinner and sometimes after dinner; also bedtime. Two low sugars occur before lunch.   One week ago, finally cough resolved.  Now grandchildren are sick.  Ear ringing is resolved. S/p ophthalmology exam; return in February 2019; wants sugar to stabilize before undergoing cataract resections.  Not exercising; stays busy.   BP Readings from Last 3 Encounters:  11/15/17 136/82  10/06/17 130/82  09/15/17 (!) 142/88   Wt Readings from Last 3 Encounters:  11/15/17 154 lb 3.2 oz (69.9 kg)  10/06/17 152 lb (68.9 kg)  09/15/17 150 lb (68 kg)   Immunization History  Administered Date(s) Administered  . Influenza Split 08/19/2012  . Influenza,inj,Quad PF,6+ Mos 01/10/2014, 11/14/2014, 11/15/2017  . Pneumococcal Polysaccharide-23 08/19/2012, 11/15/2017  . Tdap 01/10/2014    Review of Systems  Constitutional: Negative for chills, diaphoresis, fatigue and fever.  Eyes: Negative for visual disturbance.  Respiratory: Negative for cough and shortness of breath.   Cardiovascular: Negative for chest pain, palpitations and leg swelling.  Gastrointestinal: Negative for abdominal pain, constipation, diarrhea, nausea and vomiting.  Endocrine: Negative for cold intolerance, heat intolerance, polydipsia, polyphagia and polyuria.  Neurological: Negative for dizziness, tremors, seizures, syncope, facial asymmetry, speech difficulty, weakness, light-headedness, numbness and headaches.    Past Medical History:  Diagnosis Date  . Anxiety   . Depression   . Diabetes mellitus without  complication (HCC)   . Hyperlipidemia   . Hypertension   . Thyroid disease    Past Surgical History:  Procedure Laterality Date  . BREAST EXCISIONAL BIOPSY Left   . CHOLECYSTECTOMY    . COLON SURGERY     diverticulitis with perforation; s/p colon resection.  . Colonoscopy    . TUBAL LIGATION     No Known Allergies Current Outpatient Medications on File Prior to Visit  Medication Sig Dispense Refill  . ALPRAZolam (XANAX) 1 MG tablet Take 0.5 mg by mouth 3 (three) times daily as needed for anxiety or sleep.     Marland Kitchen. amphetamine-dextroamphetamine (ADDERALL) 20 MG tablet Reported on 11/26/2015  0  . aspirin 81 MG tablet Take 81 mg by mouth daily.    . DULoxetine (CYMBALTA) 60 MG capsule Take 2 capsules (120 mg total) by mouth daily. 180 capsule 1  . Exenatide ER (BYDUREON) 2 MG PEN Inject into the skin.    Marland Kitchen. glipiZIDE (GLUCOTROL XL) 10 MG 24 hr tablet Take 1 tablet (10 mg total) by mouth daily with breakfast. 90 tablet 3  . levothyroxine (SYNTHROID, LEVOTHROID) 88 MCG tablet TAKE 1 TABLET (88 MCG TOTAL) BY MOUTH DAILY BEFORE BREAKFAST. 90 tablet 1  . metoprolol tartrate (LOPRESSOR) 50 MG tablet Take 1 tablet (50 mg total) by mouth 2 (two) times daily. 180 tablet 1  . pravastatin (PRAVACHOL) 40 MG tablet Take 1 tablet (40 mg total) by mouth daily. 90 tablet 3   No current facility-administered medications on file prior to visit.    Social History   Socioeconomic History  . Marital status: Married    Spouse name: Not on file  . Number of children: Not on  file  . Years of education: Not on file  . Highest education level: Not on file  Social Needs  . Financial resource strain: Not on file  . Food insecurity - worry: Not on file  . Food insecurity - inability: Not on file  . Transportation needs - medical: Not on file  . Transportation needs - non-medical: Not on file  Occupational History  . Not on file  Tobacco Use  . Smoking status: Current Every Day Smoker    Packs/day: 0.75      Years: 38.00    Pack years: 28.50    Types: Cigarettes  . Smokeless tobacco: Never Used  Substance and Sexual Activity  . Alcohol use: No    Alcohol/week: 0.0 oz  . Drug use: No  . Sexual activity: Yes  Other Topics Concern  . 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 Cancer Mother        unknown primary  . Hyperlipidemia Mother   . Breast cancer Paternal Aunt        Objective:    BP 136/82 (BP Location: Left Arm, Patient Position: Sitting, Cuff Size: Large)   Pulse 85   Temp 97.7 F (36.5 C) (Oral)   Resp 18   Ht 5' 0.32" (1.532 m)   Wt 154 lb 3.2 oz (69.9 kg)   SpO2 98%   BMI 29.80 kg/m  Physical Exam  Constitutional: She is oriented to person, place, and time. She appears well-developed and well-nourished. No distress.  HENT:  Head: Normocephalic and atraumatic.  Right Ear: External ear normal.  Left Ear: External ear normal.  Nose: Nose normal.  Mouth/Throat: Oropharynx is clear and moist.  Eyes: Conjunctivae and EOM are normal. Pupils are equal, round, and reactive to light.  Neck: Normal range of motion. Neck supple. Carotid bruit is not present. No thyromegaly present.  Cardiovascular: Normal rate, regular rhythm, normal heart sounds and intact distal pulses. Exam reveals no gallop and no friction rub.  No murmur heard. Pulmonary/Chest: Effort normal and breath sounds normal. She has no wheezes. She has no rales.  Abdominal: Soft. Bowel sounds are normal. She exhibits no distension and no mass. There is no tenderness. There is no rebound and no guarding.  Lymphadenopathy:    She has no cervical adenopathy.  Neurological: She is alert and oriented to person, place, and time. No cranial nerve deficit.  Skin: Skin is  warm and dry. No rash noted. She is not diaphoretic. No erythema. No pallor.  Psychiatric: She has a normal mood and affect. Her behavior is normal.   No results found. Depression screen [facility] 2/9 11/15/2017 10/06/2017 09/15/2017 03/17/2017 09/17/2016  Decreased Interest 1 0 0 0 0  Down, Depressed, Hopeless 0 0 0 0 1  PHQ - 2 Score 1 0 0 0 1  Altered sleeping 2 - - - -  Tired, decreased energy 0 - - - -  Change in appetite 2 - - - -  Feeling bad or failure about yourself  1 - - - -  Trouble concentrating 0 - - - -  Moving slowly or fidgety/restless 1 - - - -  Suicidal thoughts 0 - - - -  PHQ-9 Score 7 - - - -  Difficult doing work/chores Not difficult at all - - - -   Fall Risk  11/15/2017 10/06/2017 09/15/2017 03/17/2017 08/09/2015  Falls in the past year? No No No No No        Assessment & Plan:   1. Type 2 diabetes mellitus without complication, without long-term current use of insulin (HCC)   2. Essential hypertension, benign   3. Lower respiratory infection   4. Need for pneumococcal vaccination   5. Need for prophylactic vaccination and inoculation against influenza    Diabetes control much improved with dietary modification and medications.  Obtain labs.  No change to therapy at this time.  Hypertension improved with improved glucose control.  Lower respiratory infection finally improved.  Status post influenza and pneumococcal vaccines today.  Orders Placed This Encounter  Procedures  . Flu Vaccine QUAD 36+ mos IM  . Pneumococcal polysaccharide vaccine 23-valent greater than or equal to 2yo subcutaneous/IM  . Comprehensive metabolic panel   No orders of the defined types were placed in this encounter.   Return in about 6 weeks (around 12/27/2017) for recheck.   Waunetta Riggle Paulita Fujita, M.D. Primary Care at [facility] previously [facility] [address] [place], [place]  [address] [phone] phone [phone] fax

## 2017-11-15 NOTE — Patient Instructions (Addendum)
   IF you received an x-ray today, you will receive an invoice from [facility]. Please contact [facility] at [phone] with questions or concerns regarding your invoice.   IF you received labwork today, you will receive an invoice from [facility]. Please contact [facility] at [phone] with questions or concerns regarding your invoice.   Our billing staff will not be able to assist you with questions regarding bills from these companies.  You will be contacted with the lab results as soon as they are available. The fastest way to get your results is to activate your My Chart account. Instructions are located on the last page of this paperwork. If you have not heard from us regarding the results in 2 weeks, please contact this office.      Diabetes Mellitus and Sick Day Management Blood sugar (glucose) can be difficult to control when you are sick. Common illnesses that can cause problems for people with diabetes (diabetes mellitus) include colds, fever, flu (influenza), nausea, vomiting, and diarrhea. These illnesses can cause stress and loss of body fluids (dehydration), and those issues can cause blood glucose levels to increase. Because of this, it is very important to take your insulin and diabetes medicines and eat some form of carbohydrate when you are sick. You should make a plan for days when you are sick (sick day plan) as part of your diabetes management plan. You and your health care provider should make this plan in advance. The following guidelines are intended to help you manage an illness that lasts for about 24 hours or less. Your health care provider may also give you more specific instructions. What do I need to do to manage my blood glucose?  Check your blood glucose every 2-4 hours, or as often as told by your health care provider.  Know your sick day treatment goals. Your target blood glucose levels may be different when you are sick.  If you use  insulin, take your usual dose. ? If your blood glucose continues to be too high, you may need to take an additional insulin dose as told by your health care provider.  If you use oral diabetes medicine, you may need to stop taking it if you are not able to eat or drink normally. Ask your health care provider about whether you need to stop taking these medicines while you are sick.  If you use injectable hormone medicines other than insulin to control your diabetes, ask your health care provider about whether you need to stop taking these medicines while you are sick. What else can I do to manage my diabetes when I am sick? Check your ketones  If you have type 1 diabetes, check your urine ketones every 4 hours.  If you have type 2 diabetes, check your urine ketones as often as told by your health care provider. Drink fluids  Drink enough fluid to keep your urine clear or pale yellow. This is especially important if you have a fever, vomiting, or diarrhea. Those symptoms can lead to dehydration.  Follow any instructions from your health care provider about beverages to avoid. ? Do not drink alcohol, caffeine, or drinks that contain a lot of sugar. Take medicines as directed  Take-over-the-counter and prescription medicines only as told by your health care provider.  Check medicine labels for added sugars. Some medicines may contain sugar or types of sugars that can raise your blood glucose level. What foods can I eat when I am sick? You   need to eat some form of carbohydrates when you are sick. You should eat 45-50 grams (45-50 g) of carbohydrates every 3-4 hours until you feel better. All of the food choices below contain about 15 g of carbohydrates. Plan ahead and keep some of these foods around so you have them if you get sick.  4-6 oz (120-177 mL) carbonated beverage that contains sugar, such as regular (not diet) soda. You may be able to drink carbonated beverages more easily if you open  the beverage and let it sit at room temperature for a few minutes before drinking.   of a twin frozen ice pop.  4 oz (120 g) regular gelatin.  4 oz (120 mL) fruit juice.  4 oz (120 g) ice cream or frozen yogurt.  2 oz (60 g) sherbet.  8 oz (240 mL) clear broth or soup.  4 oz (120 g) regular custard.  4 oz (120 g) regular pudding.  8 oz (240 g) plain yogurt.  1 slice bread or toast.  6 saltine crackers.  5 vanilla wafers.  Questions to ask your health care provider Consider asking the following questions so you know what to do on days when you are sick:  Should I adjust my diabetes medicines?  How often do I need to check my blood glucose?  What supplies do I need to manage my diabetes at home when I am sick?  What number can I call if I have questions?  What foods and drinks should I avoid?  Contact a health care provider if:  You develop symptoms of diabetic ketoacidosis, such as: ? Fatigue. ? Weight loss. ? Excessive thirst. ? Light-headedness. ? Fruity or sweet-smelling breath. ? Excessive urination. ? Vision changes. ? Confusion or irritability. ? Nausea. ? Vomiting. ? Rapid breathing. ? Pain in the abdomen. ? Feeling flushed.  You are unable to drink fluids without vomiting.  You have any of the following for more than 6 hours: ? Nausea. ? Vomiting. ? Diarrhea.  Your blood glucose is at or above 240 mg/dL (13.3 mmol/L), even after you take an additional insulin dose.  You have a change in how you think, feel, or act (mental status).  You develop another serious illness.  You have been sick or have had a fever for 2 days or longer and you are not getting better. Get help right away if:  Your blood glucose is lower than 54 mg/dL (3.0 mmol/L).  You have difficulty breathing.  You have moderate or high ketone levels in your urine.  You used emergency glucagon to treat low blood glucose. Summary  Blood sugar (glucose) can be difficult  to control when you are sick. Common illnesses that can cause problems for people with diabetes (diabetes mellitus) include colds, fever, flu (influenza), nausea, vomiting, and diarrhea.  Illnesses can cause stress and loss of body fluids (dehydration), and those issues can cause blood glucose levels to increase.  Make a plan for days when you are sick (sick day plan) as part of your diabetes management plan. You and your health care provider should make this plan in advance.  It is very important to take your insulin and diabetes medicines and to eat some form of carbohydrate when you are sick.  Contact your health care provider if have problems managing your blood glucose levels when you are sick, or if you have been sick or had a fever for 2 days or longer and are not getting better. This information is not   intended to replace advice given to you by your health care provider. Make sure you discuss any questions you have with your health care provider. Document Released: 11/19/2003 Document Revised: 08/14/2016 Document Reviewed: 08/14/2016 Elsevier Interactive Patient Education  2018 Elsevier Inc.  

## 2017-11-16 ENCOUNTER — Encounter: Payer: Self-pay | Admitting: Family Medicine

## 2017-11-16 LAB — COMPREHENSIVE METABOLIC PANEL
ALT: 10 IU/L (ref 0–32)
AST: 22 IU/L (ref 0–40)
Albumin/Globulin Ratio: 1.3 (ref 1.2–2.2)
Albumin: 4.2 g/dL (ref 3.6–4.8)
Alkaline Phosphatase: 83 IU/L (ref 39–117)
BUN/Creatinine Ratio: 11 — ABNORMAL LOW (ref 12–28)
BUN: 8 mg/dL (ref 8–27)
Bilirubin Total: 0.4 mg/dL (ref 0.0–1.2)
CO2: 28 mmol/L (ref 20–29)
Calcium: 10.1 mg/dL (ref 8.7–10.3)
Chloride: 100 mmol/L (ref 96–106)
Creatinine, Ser: 0.75 mg/dL (ref 0.57–1.00)
GFR calc Af Amer: 99 mL/min/{1.73_m2} (ref >59)
GFR calc non Af Amer: 86 mL/min/{1.73_m2} (ref >59)
Globulin, Total: 3.3 g/dL (ref 1.5–4.5)
Glucose: 76 mg/dL (ref 65–99)
Potassium: 4.3 mmol/L (ref 3.5–5.2)
Sodium: 143 mmol/L (ref 134–144)
Total Protein: 7.5 g/dL (ref 6.0–8.5)

## 2017-11-16 NOTE — Progress Notes (Signed)
Letter sent.

## 2017-11-26 ENCOUNTER — Encounter: Payer: Self-pay | Admitting: Family Medicine

## 2017-12-07 ENCOUNTER — Encounter: Payer: Self-pay | Admitting: Family Medicine

## 2018-01-24 DIAGNOSIS — H2513 Age-related nuclear cataract, bilateral: Secondary | ICD-10-CM | POA: Diagnosis not present

## 2018-01-24 DIAGNOSIS — H35033 Hypertensive retinopathy, bilateral: Secondary | ICD-10-CM | POA: Diagnosis not present

## 2018-01-24 DIAGNOSIS — E113211 Type 2 diabetes mellitus with mild nonproliferative diabetic retinopathy with macular edema, right eye: Secondary | ICD-10-CM | POA: Diagnosis not present

## 2018-01-28 ENCOUNTER — Ambulatory Visit: Payer: Self-pay | Admitting: *Deleted

## 2018-01-28 ENCOUNTER — Emergency Department (HOSPITAL_COMMUNITY)
Admission: EM | Admit: 2018-01-28 | Discharge: 2018-01-28 | Disposition: A | Discharge status: Home / Self Care | Payer: 59 | Attending: Emergency Medicine | Admitting: Emergency Medicine

## 2018-01-28 ENCOUNTER — Encounter (HOSPITAL_COMMUNITY): Payer: Self-pay

## 2018-01-28 DIAGNOSIS — Z7984 Long term (current) use of oral hypoglycemic drugs: Secondary | ICD-10-CM | POA: Insufficient documentation

## 2018-01-28 DIAGNOSIS — E119 Type 2 diabetes mellitus without complications: Secondary | ICD-10-CM | POA: Diagnosis not present

## 2018-01-28 DIAGNOSIS — K047 Periapical abscess without sinus: Secondary | ICD-10-CM | POA: Diagnosis not present

## 2018-01-28 DIAGNOSIS — I10 Essential (primary) hypertension: Secondary | ICD-10-CM | POA: Insufficient documentation

## 2018-01-28 DIAGNOSIS — E039 Hypothyroidism, unspecified: Secondary | ICD-10-CM | POA: Insufficient documentation

## 2018-01-28 DIAGNOSIS — Z79899 Other long term (current) drug therapy: Secondary | ICD-10-CM | POA: Diagnosis not present

## 2018-01-28 DIAGNOSIS — F17210 Nicotine dependence, cigarettes, uncomplicated: Secondary | ICD-10-CM | POA: Diagnosis not present

## 2018-01-28 DIAGNOSIS — R220 Localized swelling, mass and lump, head: Secondary | ICD-10-CM | POA: Diagnosis present

## 2018-01-28 MED ORDER — PENICILLIN V POTASSIUM 500 MG PO TABS
500.0000 mg | ORAL_TABLET | Freq: Once | ORAL | Status: AC
  Administered 2018-01-28: 500 mg via ORAL
  Filled 2018-01-28: qty 1

## 2018-01-28 MED ORDER — PENICILLIN V POTASSIUM 500 MG PO TABS: 500.0000 mg | ORAL_TABLET | Freq: Four times a day (QID) | ORAL | 0 refills | Status: DC

## 2018-01-28 NOTE — ED Triage Notes (Signed)
Pt complains of a possible dental abscess  Pt states that her right jaw started to swell tonight while eating

## 2018-01-28 NOTE — Telephone Encounter (Signed)
Pt called while en route to [place] with complaints of swelling to the right side of face, neck and jaw while eating a salad at home.  Pt states her husband is currently driving.Pt states as she was eating a salad she started to experience swelling that caused discomfort to the right side of face in front of her ear going into jaw and throat Pt denies having any pain to that side of the face and states that there was a spot under her right ear that was tender to touch. Pt states she also took 1/2 Xanax to help calm her nerves. Pt advised to go to ED for treatment. Pt verbalized understanding and is currently going to [facility] ED.   Reason for Disposition . [1] SEVERE swelling of entire face AND [2] < 2 hours since exposure to high-risk allergen (e.g., peanuts, tree nuts, fish, shellfish or 1st dose of drug) AND [3] no serious symptoms AND [4] no serious allergic reaction in the past  Protocols used: [facility]

## 2018-01-28 NOTE — ED Provider Notes (Signed)
[facility]-EMERGENCY DEPT Provider Note   CSN: [id number] Arrival date & time: 01/28/18  1749     History   Chief Complaint Chief Complaint  Patient presents with  . Facial Swelling    HPI Felicia Hanson is a 62 y.o. female.  Patient presents to the ED with a chief complaint of dental pain.  She states that she has a history of dental infections.  She states that the symptoms started a few days ago.  She denies any fever.  Denies any throat swelling.  She has a Education officer, community and is making an appointment.  She only reports mild pain.  She denies any other associated symptoms.   The history is provided by the patient. No language interpreter was used.    Past Medical History:  Diagnosis Date  . Anxiety   . Depression   . Diabetes mellitus without complication (HCC)   . Hyperlipidemia   . Hypertension   . Thyroid disease     Patient Active Problem List   Diagnosis Date Noted  . Type 2 diabetes mellitus without complication, without long-term current use of insulin (HCC) 10/26/2017  . Generalized anxiety disorder 01/10/2014  . Vitamin D deficiency 01/10/2014  . Essential hypertension, benign 01/10/2014  . Pure hypercholesterolemia 01/10/2014  . Hypothyroidism 01/10/2014    Past Surgical History:  Procedure Laterality Date  . BREAST EXCISIONAL BIOPSY Left   . CHOLECYSTECTOMY    . COLON SURGERY     diverticulitis with perforation; s/p colon resection.  . Colonoscopy    . TUBAL LIGATION      OB History    No data available       Home Medications    Prior to Admission medications   Medication Sig Start Date End Date Taking? Authorizing Provider  ALPRAZolam Prudy Feeler) 1 MG tablet Take 0.5 mg by mouth 3 (three) times daily as needed for anxiety or sleep.  08/21/13   [provider]  amphetamine-dextroamphetamine (ADDERALL) 20 MG tablet Reported on 11/26/2015 06/14/15   [provider]  aspirin 81 MG tablet Take 81 mg by mouth daily.     [provider]  DULoxetine (CYMBALTA) 60 MG capsule Take 2 capsules (120 mg total) by mouth daily. 09/15/17   Ethelda Chick, MD  Exenatide ER (BYDUREON) 2 MG PEN Inject into the skin.    [provider]  glipiZIDE (GLUCOTROL XL) 10 MG 24 hr tablet Take 1 tablet (10 mg total) by mouth daily with breakfast. 09/15/17   Ethelda Chick, MD  levothyroxine (SYNTHROID, LEVOTHROID) 88 MCG tablet TAKE 1 TABLET (88 MCG TOTAL) BY MOUTH DAILY BEFORE BREAKFAST. 09/15/17   Ethelda Chick, MD  metoprolol tartrate (LOPRESSOR) 50 MG tablet Take 1 tablet (50 mg total) by mouth 2 (two) times daily. 09/15/17   Ethelda Chick, MD  penicillin v potassium (VEETID) 500 MG tablet Take 1 tablet (500 mg total) by mouth 4 (four) times daily. 01/28/18   Roxy Horseman, PA-C  pravastatin (PRAVACHOL) 40 MG tablet Take 1 tablet (40 mg total) by mouth daily. 09/15/17   Ethelda Chick, MD    Family History Family History  Problem Relation Age of Onset  . Cancer Mother        unknown primary  . Hyperlipidemia Mother   . Breast cancer Paternal Aunt     Social History Social History   Tobacco Use  . Smoking status: Current Every Day Smoker    Packs/day: 0.75    Years:  38.00    Pack years: 28.50    Types: Cigarettes  . Smokeless tobacco: Never Used  Substance Use Topics  . Alcohol use: No    Alcohol/week: 0.0 oz  . Drug use: No     Allergies   Patient has no known allergies.   Review of Systems Review of Systems  Constitutional: Negative for chills and fever.  HENT: Positive for dental problem. Negative for drooling.   Neurological: Negative for speech difficulty.  Psychiatric/Behavioral: Positive for sleep disturbance.     Physical Exam Updated Vital Signs BP (!) 178/92 (BP Location: Left Arm)   Pulse 98   Temp 98.3 F (36.8 C) (Oral)   Resp 20   SpO2 98%   Physical Exam Physical Exam  Constitutional: Pt appears well-developed and well-nourished.  HENT:  Head:  Normocephalic.  Right Ear: Tympanic membrane, external ear and ear canal normal.  Left Ear: Tympanic membrane, external ear and ear canal normal.  Nose: Nose normal. Right sinus exhibits no maxillary sinus tenderness and no frontal sinus tenderness. Left sinus exhibits no maxillary sinus tenderness and no frontal sinus tenderness.  Mouth/Throat: Uvula is midline, oropharynx is clear and moist and mucous membranes are normal. No oral lesions. No uvula swelling or lacerations. No oropharyngeal exudate, posterior oropharyngeal edema, posterior oropharyngeal erythema or tonsillar abscesses.  Poor dentition No gingival swelling, fluctuance or induration No gross abscess  No sublingual edema, tenderness to palpation, or sign of Ludwig's angina, or deep space infection Pain at lower front teeth Right submandibular lymphadenopathy Eyes: Conjunctivae are normal. Pupils are equal, round, and reactive to light. Right eye exhibits no discharge. Left eye exhibits no discharge.  Neck: Normal range of motion. Neck supple.  No stridor Handling secretions without difficulty No nuchal rigidity No cervical lymphadenopathy Cardiovascular: Normal rate, regular rhythm and normal heart sounds.   Pulmonary/Chest: Effort normal. No respiratory distress.  Equal chest rise  Abdominal: Soft. Bowel sounds are normal. Pt exhibits no distension. There is no tenderness.  Lymphadenopathy: Pt has no cervical adenopathy.  Neurological: Pt is alert and oriented x 4  Skin: Skin is warm and dry.  Psychiatric: Pt has a normal mood and affect.  Nursing note and vitals reviewed.    ED Treatments / Results  Labs (all labs ordered are listed, but only abnormal results are displayed) Labs Reviewed - No data to display  EKG  EKG Interpretation None       Radiology No results found.  Procedures Procedures (including critical care time)  Medications Ordered in ED Medications  penicillin v potassium (VEETID) tablet  500 mg (not administered)     Initial Impression / Assessment and Plan / ED Course  I have reviewed the triage vital signs and the nursing notes.  Pertinent labs & imaging results that were available during my care of the patient were reviewed by me and considered in my medical decision making (see chart for details).     Patient with dentalgia.  No abscess requiring immediate incision and drainage.  Exam not concerning for Ludwig's angina or pharyngeal abscess.  Will treat with pencillin. Pt instructed to follow-up with dentist.  Discussed return precautions. Pt safe for discharge.   Final Clinical Impressions(s) / ED Diagnoses   Final diagnoses:  Dental infection    ED Discharge Orders        Ordered    penicillin v potassium (VEETID) 500 MG tablet  4 times daily     01/28/18 2323  Roxy Horseman, PA-C 01/28/18 2327    Linwood Dibbles, MD 01/29/18 [phone]

## 2018-02-03 ENCOUNTER — Telehealth: Payer: Self-pay

## 2018-02-03 NOTE — Telephone Encounter (Signed)
Copied from CRM [id number]. Topic: General - Other >> Feb 01, 2018  2:02 PM Percival Spanish wrote:  Pt call to say she was told she had an infection but there was no labs or urine done test done. Was given 10 day supply penicilin and need to follow up. She will come in early date  if Dr Katrinka Blazing need to see her before 02/11/18   [phone]   178/100 when she arrive to the ER  178/90 4 later when she was discharged

## 2018-02-03 NOTE — Telephone Encounter (Signed)
Patient instructed at ED visit 01/28/2018 to follow up with dentist regarding dental infection.   Phone call to patient. She states she is feeling okay. Waiting Dr. Katrinka Blazing to [place] dental work, might have to have her bottom teeth pulled. Has been putting dental work off. She states she is also concerned about her blood pressures when she went to the emergency room. Patient was supposed to return end of January/beginning of February for recheck from last office visit and did not.    Lab Results  Component Value Date   HGBA1C >14 09/15/2017   Component     Latest Ref Rng & Units 11/15/2017  Glucose     65 - 99 mg/dL 76    Provider, OK for patient to follow up with Dentist for dental work? Would you like to see her for follow up visit? Please advise.

## 2018-02-06 NOTE — Telephone Encounter (Signed)
Call --- 1.  Patient needs appointment with dentist for consultation.  2. She also needs appointment with me this week for BP recheck and further clearance for dental procedure.  Need to check sugar and BP. Can wait until 02/11/18.

## 2018-02-07 NOTE — Telephone Encounter (Signed)
Phone call to patient. Reviewed message from Dr. Katrinka Blazing with patient. She states she has appointment on 4/15 with dentist, will keep appointment 3/15 with Dr. Katrinka Blazing. Patient advised to call back if further questions/ concerns. She is agreeable. Closing note.

## 2018-02-11 ENCOUNTER — Other Ambulatory Visit: Payer: Self-pay

## 2018-02-11 ENCOUNTER — Encounter: Payer: Self-pay | Admitting: Family Medicine

## 2018-02-11 ENCOUNTER — Ambulatory Visit: Payer: 59 | Admitting: Family Medicine

## 2018-02-11 VITALS — BP 128/86 | HR 103 | Temp 98.0°F | Resp 16 | Ht 59.84 in | Wt 167.0 lb

## 2018-02-11 DIAGNOSIS — F411 Generalized anxiety disorder: Secondary | ICD-10-CM | POA: Diagnosis not present

## 2018-02-11 DIAGNOSIS — I10 Essential (primary) hypertension: Secondary | ICD-10-CM

## 2018-02-11 DIAGNOSIS — K115 Sialolithiasis: Secondary | ICD-10-CM

## 2018-02-11 DIAGNOSIS — E034 Atrophy of thyroid (acquired): Secondary | ICD-10-CM | POA: Diagnosis not present

## 2018-02-11 DIAGNOSIS — E119 Type 2 diabetes mellitus without complications: Secondary | ICD-10-CM

## 2018-02-11 DIAGNOSIS — E7800 Pure hypercholesterolemia, unspecified: Secondary | ICD-10-CM

## 2018-02-11 LAB — GLUCOSE, POCT (MANUAL RESULT ENTRY): POC Glucose: 92 mg/dl (ref 70–99)

## 2018-02-11 MED ORDER — DULOXETINE HCL 60 MG PO CPEP: 120.0000 mg | ORAL_CAPSULE | Freq: Every day | ORAL | 1 refills | Status: DC

## 2018-02-11 MED ORDER — METOPROLOL TARTRATE 50 MG PO TABS: 50.0000 mg | ORAL_TABLET | Freq: Two times a day (BID) | ORAL | 1 refills | Status: DC

## 2018-02-11 MED ORDER — LEVOTHYROXINE SODIUM 88 MCG PO TABS
ORAL_TABLET | ORAL | 1 refills | Status: DC
Start: 2018-02-11 — End: 2018-10-21

## 2018-02-11 MED ORDER — AMOXICILLIN-POT CLAVULANATE 875-125 MG PO TABS: 1.0000 | ORAL_TABLET | Freq: Two times a day (BID) | ORAL | 0 refills | Status: DC

## 2018-02-11 NOTE — Progress Notes (Signed)
Subjective:    Patient ID: Felicia Hanson, female    DOB: [birth year], 62 y.o.   MRN: [id number]  02/11/2018  Hospitalization Follow-up (pt was seen in the ER on 3/1 for dental pain)    HPI This 62 y.o. female presents for evaluation of ED FOLLOW-UP FOR DENTAL INFECTION.  Treated on 01/28/18 with PCN.  After completing PCN, developed pain again recurrent.   Has appointment on 03/14/18 with dentist.   Ate jelly toast today with banana.  Swelling resolved the next morning. Eating salad with acute onset of R sided swelling.  Looked in the mirror and got very alarmed.  No panic.  BP was extremely elevated during ED visit.  No blood work.  No pain other than recently.  Mild low grade fever for two days.  Grandchildren have been sick; taking Airborne and herbal supplements.  No different meds.  Sugars running 159 which is significantly higher than usual.  Has been staying 90-120. Had one low at 54 and got shaky.  Ate something sweet.  More stress in past two weeks; nightmare.  Unable to take Metformin too big.    NO COLONOSCOPY; NO GYNECOLOGY APPOINTMENT.   BP Readings from Last 3 Encounters:  02/11/18 128/86  01/28/18 (!) 172/97  11/15/17 136/82   Wt Readings from Last 3 Encounters:  02/11/18 167 lb (75.8 kg)  11/15/17 154 lb 3.2 oz (69.9 kg)  10/06/17 152 lb (68.9 kg)   Immunization History  Administered Date(s) Administered  . Influenza Split 08/19/2012  . Influenza, Seasonal, Injecte, Preservative Fre 01/10/2014, 11/14/2014  . Influenza,inj,Quad PF,6+ Mos 01/10/2014, 11/14/2014, 11/15/2017  . Pneumococcal Polysaccharide-23 08/19/2012, 11/15/2017  . Tdap 01/10/2014    Review of Systems  Constitutional: Positive for fever. Negative for chills, diaphoresis and fatigue.  HENT: Positive for dental problem and facial swelling. Negative for congestion, drooling, ear discharge, ear pain, hearing loss, mouth sores, nosebleeds, postnasal drip, rhinorrhea, sinus pressure, sinus pain, sneezing,  sore throat, tinnitus, trouble swallowing and voice change.   Eyes: Negative for visual disturbance.  Respiratory: Negative for cough and shortness of breath.   Cardiovascular: Negative for chest pain, palpitations and leg swelling.  Gastrointestinal: Negative for abdominal pain, constipation, diarrhea, nausea and vomiting.  Endocrine: Negative for cold intolerance, heat intolerance, polydipsia, polyphagia and polyuria.  Skin: Negative for color change, pallor, rash and wound.  Neurological: Negative for dizziness, tremors, seizures, syncope, facial asymmetry, speech difficulty, weakness, light-headedness, numbness and headaches.    Past Medical History:  Diagnosis Date  . Anxiety   . Depression   . Diabetes mellitus without complication (HCC)   . Hyperlipidemia   . Hypertension   . Thyroid disease    Past Surgical History:  Procedure Laterality Date  . BREAST EXCISIONAL BIOPSY Left   . CHOLECYSTECTOMY    . COLON SURGERY     diverticulitis with perforation; s/p colon resection.  . Colonoscopy    . TUBAL LIGATION     No Known Allergies Current Outpatient Medications on File Prior to Visit  Medication Sig Dispense Refill  . ALPRAZolam (XANAX) 1 MG tablet Take 0.5 mg by mouth 3 (three) times daily as needed for anxiety or sleep.     Marland Kitchen amphetamine-dextroamphetamine (ADDERALL) 20 MG tablet Reported on 11/26/2015  0  . aspirin 81 MG tablet Take 81 mg by mouth daily.    . Exenatide ER (BYDUREON) 2 MG PEN Inject into the skin.    Marland Kitchen glipiZIDE (GLUCOTROL XL) 10 MG 24 hr tablet Take 1  tablet (10 mg total) by mouth daily with breakfast. 90 tablet 3  . pravastatin (PRAVACHOL) 40 MG tablet Take 1 tablet (40 mg total) by mouth daily. 90 tablet 3   No current facility-administered medications on file prior to visit.    Social History   Socioeconomic History  . Marital status: Married    Spouse name: Not on file  . Number of children: Not on file  . Years of education: Not on file  .  Highest education level: Not on file  Occupational History  . Not on file  Social Needs  . Financial resource strain: Not on file  . Food insecurity:    Worry: Not on file    Inability: Not on file  . Transportation needs:    Medical: Not on file    Non-medical: Not on file  Tobacco Use  . Smoking status: Current Every Day Smoker    Packs/day: 0.75    Years: 38.00    Pack years: 28.50    Types: Cigarettes  . Smokeless tobacco: Never Used  Substance and Sexual Activity  . Alcohol use: No    Alcohol/week: 0.0 oz  . Drug use: No  . Sexual activity: Yes  Lifestyle  . Physical activity:    Days per week: Not on file    Minutes per session: Not on file  . Stress: Not on file  Relationships  . Social connections:    Talks on phone: Not on file    Gets together: Not on file    Attends religious service: Not on file    Active member of club or organization: Not on file    Attends meetings of clubs or organizations: Not on file    Relationship status: Not on file  . Intimate partner violence:    Fear of current or ex partner: Not on file    Emotionally abused: Not on file    Physically abused: Not on file    Forced sexual activity: Not on file  Other Topics Concern  . 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 Cancer Mother        unknown primary  . Hyperlipidemia Mother   . Breast cancer Paternal Aunt        Objective:    BP 128/86   Pulse (!) 103   Temp 98 F (36.7 C) (Oral)   Resp 16   Ht 4' 11.84" (1.52 m)   Wt 167 lb (75.8 kg)   SpO2 95%   BMI 32.79 kg/m  Physical Exam  Constitutional: She is oriented to person, place, and time. She appears well-developed and well-nourished. No distress.  HENT:  Head:  Normocephalic and atraumatic.  Right Ear: Tympanic membrane, external ear and ear canal normal.  Left Ear: Tympanic membrane, external ear and ear canal normal.  Nose: Nose normal. No mucosal edema or rhinorrhea. Right sinus exhibits no maxillary sinus tenderness and no frontal sinus tenderness. Left sinus exhibits no maxillary sinus tenderness and no frontal sinus tenderness.  Mouth/Throat: Oropharynx is clear and moist and mucous membranes are normal. No oral lesions. Dental caries present. No dental  abscesses, uvula swelling or lacerations. No oropharyngeal exudate, posterior oropharyngeal edema, posterior oropharyngeal erythema or tonsillar abscesses.  No tenderness along gumlines.  No fluctuance along gumlines.  No buccal mucosa lesions.  Eyes: Conjunctivae and EOM are normal. Pupils are equal, round, and reactive to light.  Neck: Normal range of motion. Neck supple. Carotid bruit is not present. No tracheal deviation present. No thyromegaly present.  Cardiovascular: Normal rate, regular rhythm, normal heart sounds and intact distal pulses. Exam reveals no gallop and no friction rub.  No murmur heard. Pulmonary/Chest: Effort normal and breath sounds normal. She has no wheezes. She has no rales.  Abdominal: Soft. Bowel sounds are normal. She exhibits no distension and no mass. There is no tenderness. There is no rebound and no guarding.  Lymphadenopathy:    She has no cervical adenopathy.  Neurological: She is alert and oriented to person, place, and time. No cranial nerve deficit.  Skin: Skin is warm and dry. No rash noted. She is not diaphoretic. No erythema. No pallor.  Psychiatric: She has a normal mood and affect. Her behavior is normal.   No results found. Depression screen [facility] 2/9 02/11/2018 11/15/2017 10/06/2017 09/15/2017 03/17/2017  Decreased Interest 2 1 0 0 0  Down, Depressed, Hopeless 2 0 0 0 0  PHQ - 2 Score 4 1 0 0 0  Altered sleeping 1 2 - - -  Tired, decreased energy 2 0 - -  -  Change in appetite 2 2 - - -  Feeling bad or failure about yourself  2 1 - - -  Trouble concentrating 0 0 - - -  Moving slowly or fidgety/restless 0 1 - - -  Suicidal thoughts 0 0 - - -  PHQ-9 Score 11 7 - - -  Difficult doing work/chores - Not difficult at all - - -   Fall Risk  02/11/2018 11/15/2017 10/06/2017 09/15/2017 03/17/2017  Falls in the past year? No No No No No   Results for orders placed or performed in visit on 02/11/18  CBC with Differential/Platelet  Result Value Ref Range   WBC 11.0 (H) 3.4 - 10.8 x10E3/uL   RBC 5.08 3.77 - 5.28 x10E6/uL   Hemoglobin 15.7 11.1 - 15.9 g/dL   Hematocrit 40.9 81.1 - 46.6 %   MCV 91 79 - 97 fL   MCH 30.9 26.6 - 33.0 pg   MCHC 34.1 31.5 - 35.7 g/dL   RDW 91.4 78.2 - 95.6 %   Platelets 268 150 - 379 x10E3/uL   Neutrophils 42 Not Estab. %   Lymphs 45 Not Estab. %   Monocytes 8 Not Estab. %   Eos 4 Not Estab. %   Basos 1 Not Estab. %   Neutrophils Absolute 4.6 1.4 - 7.0 x10E3/uL   Lymphocytes Absolute 4.9 (H) 0.7 - 3.1 x10E3/uL   Monocytes Absolute 0.9 0.1 - 0.9 x10E3/uL   EOS (ABSOLUTE) 0.5 (H) 0.0 - 0.4 x10E3/uL   Basophils Absolute 0.1 0.0 - 0.2 x10E3/uL   Immature Granulocytes 0 Not Estab. %   Immature Grans (Abs) 0.0 0.0 - 0.1 x10E3/uL  Comprehensive metabolic panel  Result Value Ref Range   Glucose 99 65 - 99 mg/dL   BUN 7 (L) 8 - 27 mg/dL   Creatinine, Ser 2.13 0.57 - 1.00 mg/dL   GFR calc non Af Amer 78 >59 mL/min/1.73   GFR calc Af Amer 89 >59 mL/min/1.73   BUN/Creatinine Ratio 9 (L) 12 - 28   Sodium 140 134 -  144 mmol/L   Potassium 4.3 3.5 - 5.2 mmol/L   Chloride 101 96 - 106 mmol/L   CO2 23 20 - 29 mmol/L   Calcium 9.6 8.7 - 10.3 mg/dL   Total Protein 7.6 6.0 - 8.5 g/dL   Albumin 4.4 3.6 - 4.8 g/dL   Globulin, Total 3.2 1.5 - 4.5 g/dL   Albumin/Globulin Ratio 1.4 1.2 - 2.2   Bilirubin Total 0.4 0.0 - 1.2 mg/dL   Alkaline Phosphatase 86 39 - 117 IU/L   AST 34 0 - 40 IU/L   ALT 20 0 - 32 IU/L  Lipid panel    Result Value Ref Range   Cholesterol, Total 164 100 - 199 mg/dL   Triglycerides 161 (H) 0 - 149 mg/dL   HDL 44 >09 mg/dL   VLDL Cholesterol Cal 43 (H) 5 - 40 mg/dL   LDL Calculated 77 0 - 99 mg/dL   Chol/HDL Ratio 3.7 0.0 - 4.4 ratio  Hemoglobin A1c  Result Value Ref Range   Hgb A1c MFr Bld 6.0 (H) 4.8 - 5.6 %   Est. average glucose Bld gHb Est-mCnc 126 mg/dL  POCT glucose (manual entry)  Result Value Ref Range   POC Glucose 92 70 - 99 mg/dl        Assessment & Plan:   1. Salivary gland stone   2. Type 2 diabetes mellitus without complication, without long-term current use of insulin (HCC)   3. Essential hypertension, benign   4. Hypothyroidism due to acquired atrophy of thyroid   5. Pure hypercholesterolemia   6. Generalized anxiety disorder     Diabetes mellitus type 2: Much improved control.  Obtain labs for chronic disease management.  Recent elevation in sugars with acute illness.  Hypertension: Well-controlled.  Obtain labs.  No changes in therapy.  Hypothyroidism: Well-controlled.  Continue current medications.  Anxiety and depression: Well-controlled at this time.  Multiple family stressors.  Refill Cymbalta therapy provided.  Salivary gland stone: New onset.  Treat with Augmentin therapy.  Recommend eating sour foods regularly.  Status post emergency department visit.  Symptoms and clinical exam not consistent with dental infection.  If recurrent, refer to ENT and consider CT neck and salivary glands.  Orders Placed This Encounter  Procedures  . CBC with Differential/Platelet  . Comprehensive metabolic panel    Order Specific Question:   Has the patient fasted?    Answer:   No  . Lipid panel    Order Specific Question:   Has the patient fasted?    Answer:   No  . Hemoglobin A1c  . POCT glucose (manual entry)   Meds ordered this encounter  Medications  . amoxicillin-clavulanate (AUGMENTIN) 875-125 MG tablet    Sig: Take 1 tablet by mouth 2 (two) times  daily.    Dispense:  20 tablet    Refill:  0  . DULoxetine (CYMBALTA) 60 MG capsule    Sig: Take 2 capsules (120 mg total) by mouth daily.    Dispense:  180 capsule    Refill:  1  . levothyroxine (SYNTHROID, LEVOTHROID) 88 MCG tablet    Sig: TAKE 1 TABLET (88 MCG TOTAL) BY MOUTH DAILY BEFORE BREAKFAST.    Dispense:  90 tablet    Refill:  1  . metoprolol tartrate (LOPRESSOR) 50 MG tablet    Sig: Take 1 tablet (50 mg total) by mouth 2 (two) times daily.    Dispense:  180 tablet    Refill:  1  Return in about 3 months (around 05/14/2018) for follow-up chronic medical conditions.   Felicia Hanson Paulita Fujita, M.D. Primary Care at [facility] previously [facility] [address] [place], [place]  [address] [phone] phone [phone] fax

## 2018-02-11 NOTE — Patient Instructions (Addendum)
START MIRALAX DAILY.  Salivary Stone A salivary stone is a mineral deposit that builds up in the ducts that drain your salivary glands. Most salivary gland stones are made of calcium. When a stone forms, saliva can back up into the gland and cause painful swelling. Your salivary glands are the glands that produce spit (saliva). You have six major salivary glands. Each gland has a duct that carries saliva into your mouth. Saliva keeps your mouth moist and breaks down the food that you eat. It also helps to prevent tooth decay. Two salivary glands are located just in front of your ears (parotid). The ducts for these glands open up inside your cheeks, near your back teeth. You also have two glands under your tongue (sublingual) and two glands under your jaw (submandibular). The ducts for these glands open under your tongue. A stone can form in any salivary gland. The most common place for a salivary stone to develop is in a submandibular salivary gland. What are the causes? Any condition that reduces the flow of saliva may lead to stone formation. It is not known why some people form stones and others do not. What increases the risk? You may be more likely to develop a salivary stone if you:  Are female.  Do not drink enough water.  Smoke.  Have high blood pressure.  Have gout.  Have diabetes.  What are the signs or symptoms? The main sign of a salivary gland stone is sudden swelling of a salivary gland when eating. This usually happens under the jaw on one side. Other signs and symptoms include:  Swelling of the cheek or under the tongue when eating.  Pain in the swollen area.  Trouble chewing or swallowing.  Swelling that goes down after eating.  How is this diagnosed? Your health care provider may diagnose a salivary gland stone based on your signs and symptoms. The health care provider will also do a physical exam. In many cases, a stone can be felt in a duct inside your mouth.  You may need to see an ear, nose, and throat specialist (ENT or otolaryngologist) for diagnosis and treatment. You may also need to have diagnostic tests. These may include imaging studies to check for a stone, such as:  X-rays.  Ultrasound.  CT scan.  MRI.  How is this treated? Home care may be enough to treat a small stone that is not causing symptoms. Treatment of a stone that is large enough to cause symptoms may include:  Probing and widening the duct to allow the stone to pass.  Inserting a thin, flexible scope (endoscope) into the duct to locate and remove the stone.  Breaking up the stone with sound waves.  Removing the entire salivary gland.  Follow these instructions at home:  Drink enough fluid to keep your urine clear or pale yellow.  Follow these instructions every few hours: ? Suck on a lemon candy to stimulate the flow of saliva. ? Put a hot compress over the gland. ? Gently massage the gland.  Do not use any tobacco products, including cigarettes, chewing tobacco, or electronic cigarettes. If you need help quitting, ask your health care provider. Contact a health care provider if:  You have pain and swelling in your face, jaw, or mouth after eating.  You have persistent swelling in any of these places: ? In front of your ear. ? Under your jaw. ? Inside your mouth. Get help right away if:  You have pain and  swelling in your face, jaw, or mouth that are getting worse.  Your pain and swelling make it hard to swallow or breathe. This information is not intended to replace advice given to you by your health care provider. Make sure you discuss any questions you have with your health care provider. Document Released: 12/24/2004 Document Revised: 04/23/2016 Document Reviewed: 04/18/2014 Elsevier Interactive Patient Education  2018 [employer].    IF you received an x-ray today, you will receive an invoice from [facility]. Please contact  [facility] at [phone] with questions or concerns regarding your invoice.   IF you received labwork today, you will receive an invoice from [facility]. Please contact [facility] at [phone] with questions or concerns regarding your invoice.   Our billing staff will not be able to assist you with questions regarding bills from these companies.  You will be contacted with the lab results as soon as they are available. The fastest way to get your results is to activate your My Chart account. Instructions are located on the last page of this paperwork. If you have not heard from [place] regarding the results in 2 weeks, please contact this office.

## 2018-02-12 LAB — CBC WITH DIFFERENTIAL/PLATELET
Basophils Absolute: 0.1 10*3/uL (ref 0.0–0.2)
Basos: 1 %
EOS (ABSOLUTE): 0.5 10*3/uL — ABNORMAL HIGH (ref 0.0–0.4)
Eos: 4 %
Hematocrit: 46 % (ref 34.0–46.6)
Hemoglobin: 15.7 g/dL (ref 11.1–15.9)
Immature Grans (Abs): 0 10*3/uL (ref 0.0–0.1)
Immature Granulocytes: 0 %
Lymphocytes Absolute: 4.9 10*3/uL — ABNORMAL HIGH (ref 0.7–3.1)
Lymphs: 45 %
MCH: 30.9 pg (ref 26.6–33.0)
MCHC: 34.1 g/dL (ref 31.5–35.7)
MCV: 91 fL (ref 79–97)
Monocytes Absolute: 0.9 10*3/uL (ref 0.1–0.9)
Monocytes: 8 %
Neutrophils Absolute: 4.6 10*3/uL (ref 1.4–7.0)
Neutrophils: 42 %
Platelets: 268 10*3/uL (ref 150–379)
RBC: 5.08 x10E6/uL (ref 3.77–5.28)
RDW: 13.4 % (ref 12.3–15.4)
WBC: 11 10*3/uL — ABNORMAL HIGH (ref 3.4–10.8)

## 2018-02-12 LAB — COMPREHENSIVE METABOLIC PANEL
ALT: 20 IU/L (ref 0–32)
AST: 34 IU/L (ref 0–40)
Albumin/Globulin Ratio: 1.4 (ref 1.2–2.2)
Albumin: 4.4 g/dL (ref 3.6–4.8)
Alkaline Phosphatase: 86 IU/L (ref 39–117)
BUN/Creatinine Ratio: 9 — ABNORMAL LOW (ref 12–28)
BUN: 7 mg/dL — ABNORMAL LOW (ref 8–27)
Bilirubin Total: 0.4 mg/dL (ref 0.0–1.2)
CO2: 23 mmol/L (ref 20–29)
Calcium: 9.6 mg/dL (ref 8.7–10.3)
Chloride: 101 mmol/L (ref 96–106)
Creatinine, Ser: 0.81 mg/dL (ref 0.57–1.00)
GFR calc Af Amer: 89 mL/min/{1.73_m2} (ref >59)
GFR calc non Af Amer: 78 mL/min/{1.73_m2} (ref >59)
Globulin, Total: 3.2 g/dL (ref 1.5–4.5)
Glucose: 99 mg/dL (ref 65–99)
Potassium: 4.3 mmol/L (ref 3.5–5.2)
Sodium: 140 mmol/L (ref 134–144)
Total Protein: 7.6 g/dL (ref 6.0–8.5)

## 2018-02-12 LAB — LIPID PANEL
Chol/HDL Ratio: 3.7 ratio (ref 0.0–4.4)
Cholesterol, Total: 164 mg/dL (ref 100–199)
HDL: 44 mg/dL (ref >39)
LDL Calculated: 77 mg/dL (ref 0–99)
Triglycerides: 216 mg/dL — ABNORMAL HIGH (ref 0–149)
VLDL Cholesterol Cal: 43 mg/dL — ABNORMAL HIGH (ref 5–40)

## 2018-02-12 LAB — HEMOGLOBIN A1C
Est. average glucose Bld gHb Est-mCnc: 126 mg/dL
Hgb A1c MFr Bld: 6 % — ABNORMAL HIGH (ref 4.8–5.6)

## 2018-03-29 ENCOUNTER — Other Ambulatory Visit: Payer: Self-pay | Admitting: Family Medicine

## 2018-03-29 NOTE — Telephone Encounter (Signed)
Pt called in to follow up on request. Pt says that she is completely out of injection. Pt would like a 90 day supply.    Please assist further.

## 2018-04-07 ENCOUNTER — Telehealth: Payer: Self-pay | Admitting: Family Medicine

## 2018-04-07 NOTE — Telephone Encounter (Signed)
Copied from CRM [id number]. Topic: Quick Communication - See Telephone Encounter >> Apr 07, 2018 10:48 AM Felicia Hanson wrote: CRM for notification. See Telephone encounter for: 04/07/18. Pt called in and said that she has Hanson cough that is keeping her up all night and would like to know if DR could call her in some cough meds to help her with cough and sleep?    Pharmacy - [facility] [address]

## 2018-04-07 NOTE — Telephone Encounter (Signed)
Pt needs to be evaluated. Please schedule OV.

## 2018-04-26 ENCOUNTER — Encounter: Payer: Self-pay | Admitting: Family Medicine

## 2018-05-02 ENCOUNTER — Telehealth: Payer: Self-pay | Admitting: Family Medicine

## 2018-05-02 NOTE — Telephone Encounter (Signed)
Patient has anxiety and does not like to take to her anxiety medicine. Her blood pressure has been high and she has diabetes. Patient [place] not drive on the highway any more and has been subpoenaed for jury duty. She has stressful situations at home and would like assistance with being written out of jury duty next Wednesday? Patient would like a call back to let her know if she can write her out or not?

## 2018-05-02 NOTE — Telephone Encounter (Signed)
Lm for patient to call back Can we see if we can get some more details on what she needs to speak to Dr. Katrinka Blazing about.

## 2018-05-02 NOTE — Telephone Encounter (Signed)
Copied from CRM [id number]. Topic: Quick Communication - See Telephone Encounter >> May 02, 2018 11:49 AM Arlyss Gandy, NT wrote: CRM for notification. See Telephone encounter for: 05/02/18. Pt requesting to speak with Dr. Katrinka Blazing regarding her anxiety and other questions per pt.

## 2018-05-02 NOTE — Telephone Encounter (Signed)
When patient calls back can we see if she will give more details on what she needs to speak with Dr. Katrinka Blazing about.

## 2018-05-03 ENCOUNTER — Telehealth: Payer: Self-pay | Admitting: Family Medicine

## 2018-05-03 NOTE — Telephone Encounter (Signed)
Patient is calling to follow up on this.  °

## 2018-05-03 NOTE — Telephone Encounter (Signed)
Surgical clearance form sent from [facility] office requesting medical clearance for surgery for patient.  Please also include last office visit with last labs with fax.

## 2018-05-03 NOTE — Telephone Encounter (Signed)
Advised pt. Pt understood

## 2018-05-03 NOTE — Telephone Encounter (Signed)
I see that patient continues to see Dr. Evelene Croon for anxiety and ADD.  She will need to receive a letter regarding her anxiety from Dr. Evelene Croon.  Has she contacted Dr. Carie Caddy office regarding her anxiety interfering with her ability to serve on a jury?

## 2018-05-03 NOTE — Telephone Encounter (Signed)
Please advise if you would like to proceed with letter.

## 2018-05-04 NOTE — Telephone Encounter (Signed)
Successful fax 05/07/2018 to [phone]

## 2018-05-20 ENCOUNTER — Telehealth: Payer: Self-pay | Admitting: Family Medicine

## 2018-05-20 MED ORDER — EXENATIDE ER 2 MG/0.85ML ~~LOC~~ AUIJ: 2.0000 mg | AUTO-INJECTOR | SUBCUTANEOUS | 2 refills | Status: DC

## 2018-05-20 NOTE — Telephone Encounter (Signed)
Copied from CRM [id number]. Topic: Quick Communication - See Telephone Encounter >> May 20, 2018 11:53 AM Jolayne Haines L wrote: CRM for notification. See Telephone encounter for: 05/20/18.  [facility] called and wants to know if Exenatide ER (BYDUREON BCISE) 2 MG/0.85ML AUIJ could be sent to a local pharmacy for her. [facility] [address] [place] ( 90 day supply )

## 2018-06-20 ENCOUNTER — Ambulatory Visit: Payer: 59 | Admitting: Family Medicine

## 2018-07-11 ENCOUNTER — Encounter: Payer: Self-pay | Admitting: Family Medicine

## 2018-08-11 ENCOUNTER — Other Ambulatory Visit: Payer: Self-pay

## 2018-08-11 ENCOUNTER — Encounter: Payer: Self-pay | Admitting: Physician Assistant

## 2018-08-11 ENCOUNTER — Ambulatory Visit: Payer: 59 | Admitting: Physician Assistant

## 2018-08-11 VITALS — BP 140/84 | HR 93 | Temp 98.9°F | Resp 16 | Ht 60.0 in | Wt 172.6 lb

## 2018-08-11 DIAGNOSIS — J0100 Acute maxillary sinusitis, unspecified: Secondary | ICD-10-CM | POA: Diagnosis not present

## 2018-08-11 DIAGNOSIS — D72829 Elevated white blood cell count, unspecified: Secondary | ICD-10-CM | POA: Diagnosis not present

## 2018-08-11 DIAGNOSIS — R042 Hemoptysis: Secondary | ICD-10-CM | POA: Diagnosis not present

## 2018-08-11 LAB — POCT CBC
Granulocyte percent: 61.3 %G (ref 37–80)
HCT, POC: 43.9 % (ref 37.7–47.9)
Hemoglobin: 14.9 g/dL (ref 12.2–16.2)
Lymph, poc: 4.4 — AB (ref 0.6–3.4)
MCH, POC: 29.8 pg (ref 27–31.2)
MCHC: 33.9 g/dL (ref 31.8–35.4)
MCV: 87.9 fL (ref 80–97)
MID (cbc): 0.5 (ref 0–0.9)
MPV: 8.7 fL (ref 0–99.8)
POC Granulocyte: 7.8 — AB (ref 2–6.9)
POC LYMPH PERCENT: 34.7 %L (ref 10–50)
POC MID %: 4 %M (ref 0–12)
Platelet Count, POC: 239 10*3/uL (ref 142–424)
RBC: 5 M/uL (ref 4.04–5.48)
RDW, POC: 12.5 %
WBC: 12.7 10*3/uL — AB (ref 4.6–10.2)

## 2018-08-11 MED ORDER — DOXYCYCLINE HYCLATE 100 MG PO CAPS: 100.0000 mg | ORAL_CAPSULE | Freq: Two times a day (BID) | ORAL | 0 refills | Status: DC

## 2018-08-11 NOTE — Patient Instructions (Addendum)
This is all likely from underlying sinus infection. I recommend mucinex, zyrtec, nasal saline rinses for the next few days. If you continue to have blood tinged mucus, please return next week as we will need to do a chest xray. If any of your symptoms worsen or you develop new vomiting, abdominal pain, or blood in stools, seek care immediately.   If you have lab work done today you will be contacted with your lab results within the next 2 weeks.  If you have not heard from [place] then please contact us. The fastest way to get your results is to register for My Chart.  Sinusitis, Adult Sinusitis is soreness and inflammation of your sinuses. Sinuses are hollow spaces in the bones around your face. They are located:  Around your eyes.  In the middle of your forehead.  Behind your nose.  In your cheekbones.  Your sinuses and nasal passages are lined with a stringy fluid (mucus). Mucus normally drains out of your sinuses. When your nasal tissues get inflamed or swollen, the mucus can get trapped or blocked so air cannot flow through your sinuses. This lets bacteria, viruses, and funguses grow, and that leads to infection. Follow these instructions at home: Medicines  Take, use, or apply over-the-counter and prescription medicines only as told by your doctor. These may include nasal sprays.  If you were prescribed an antibiotic medicine, take it as told by your doctor. Do not stop taking the antibiotic even if you start to feel better. Hydrate and Humidify  Drink enough water to keep your pee (urine) clear or pale yellow.  Use a cool mist humidifier to keep the humidity level in your home above 50%.  Breathe in steam for 10-15 minutes, 3-4 times a day or as told by your doctor. You can do this in the bathroom while a hot shower is running.  Try not to spend time in cool or dry air. Rest  Rest as much as possible.  Sleep with your head raised (elevated).  Make sure to get enough sleep each  night. General instructions  Put a warm, moist washcloth on your face 3-4 times a day or as told by your doctor. This will help with discomfort.  Wash your hands often with soap and water. If there is no soap and water, use hand sanitizer.  Do not smoke. Avoid being around people who are smoking (secondhand smoke).  Keep all follow-up visits as told by your doctor. This is important. Contact a doctor if:  You have a fever.  Your symptoms get worse.  Your symptoms do not get better within 10 days. Get help right away if:  You have a very bad headache.  You cannot stop throwing up (vomiting).  You have pain or swelling around your face or eyes.  You have trouble seeing.  You feel confused.  Your neck is stiff.  You have trouble breathing. This information is not intended to replace advice given to you by your health care provider. Make sure you discuss any questions you have with your health care provider. Document Released: 05/04/2008 Document Revised: 07/12/2016 Document Reviewed: 09/11/2015 Elsevier Interactive Patient Education  2018 [employer].   IF you received an x-ray today, you will receive an invoice from [facility]. Please contact [facility] at [phone] with questions or concerns regarding your invoice.   IF you received labwork today, you will receive an invoice from [facility]. Please contact [facility] at [phone] with questions or concerns regarding your  invoice.   Our billing staff will not be able to assist you with questions regarding bills from these companies.  You will be contacted with the lab results as soon as they are available. The fastest way to get your results is to activate your My Chart account. Instructions are located on the last page of this paperwork. If you have not heard from [place] regarding the results in 2 weeks, please contact this office.

## 2018-08-11 NOTE — Progress Notes (Signed)
Felicia Hanson  MRN: [id number] DOB: 07/10/1956  Subjective:  Felicia Hanson is a 62 y.o. female seen in office today for a chief complaint of blood tinged sputum. Notes she has had some sinus congestion x 5 days with associated headache, nasal congestion, and facial pressure. Was doing laundry 3 days and while taking a load into another room, she tripped over the laundry basket and landed directly on her stomach. Did not hit her head. No LOC. Had a pain in your abdomen immediately but it resolved after a few moments. Later that day she was blowing out some mucus and actually coughed up blood tinged mucus. Noticed it for the next 3 days, less and less each time. She feels like the mucus is coming from her sinuses draining, not her chest. Has coughed a few times but not consistently. Has felt mildly feverish. She is most concerned because she has an incisional hernia from 11 years ago and was told that if she ever hit and it started having vomiting, she should seek care immediately, so when she saw the blood she got really stressed. Notes she always has some degree of pain in the abdomen at the hernia site, especially when her grandkids hug her, but nothing leading her to consider surgery. Denies vomiting, hematemesis, hematochezia, melena, abdominal pain, chest pain, SOB, difficulty swallowing, dizziness, lightheadedness, fatigue, syncope, blurred vision, diarrhea, and constipation. Smokes 0.75ppd x 38 years. Of note, has had blood nasal mucus in the past when sinuses were very congested.  PMH of seasonal allergies (not taking daily medications),  positive TB skin test, had to [facility] up with [facility] and tested negative for TB. Denies recent travel.    Review of Systems  Constitutional: Negative for diaphoresis and unexpected weight change.  Cardiovascular: Negative for palpitations and leg swelling.  Skin: Negative for pallor.  Neurological: Negative for speech difficulty.       Patient Active Problem List   Diagnosis Date Noted  . Type 2 diabetes mellitus without complication, without long-term current use of insulin (HCC) 10/26/2017  . Generalized anxiety disorder 01/10/2014  . Vitamin D deficiency 01/10/2014  . Essential hypertension, benign 01/10/2014  . Pure hypercholesterolemia 01/10/2014  . Hypothyroidism 01/10/2014    Current Outpatient Medications on File Prior to Visit  Medication Sig Dispense Refill  . ACCU-CHEK AVIVA PLUS test strip daily. for testing  2  . ALPRAZolam (XANAX) 1 MG tablet Take 0.5 mg by mouth 3 (three) times daily as needed for anxiety or sleep.     Marland Kitchen amphetamine-dextroamphetamine (ADDERALL) 20 MG tablet Reported on 11/26/2015  0  . aspirin 81 MG tablet Take 81 mg by mouth daily.    . DULoxetine (CYMBALTA) 60 MG capsule Take 2 capsules (120 mg total) by mouth daily. 180 capsule 1  . Exenatide ER (BYDUREON BCISE) 2 MG/0.85ML AUIJ Inject 2 mg into the skin once a week. 3.4 mL 2  . glipiZIDE (GLUCOTROL XL) 10 MG 24 hr tablet Take 1 tablet (10 mg total) by mouth daily with breakfast. 90 tablet 3  . levothyroxine (SYNTHROID, LEVOTHROID) 88 MCG tablet TAKE 1 TABLET (88 MCG TOTAL) BY MOUTH DAILY BEFORE BREAKFAST. 90 tablet 1  . metoprolol tartrate (LOPRESSOR) 50 MG tablet Take 1 tablet (50 mg total) by mouth 2 (two) times daily. 180 tablet 1  . pravastatin (PRAVACHOL) 40 MG tablet Take 1 tablet (40 mg total) by mouth daily. 90 tablet 3   No current facility-administered medications on file  prior to visit.     No Known Allergies    Social History   Socioeconomic History  . Marital status: Married    Spouse name: Not on file  . Number of children: Not on file  . Years of education: Not on file  . Highest education level: Not on file  Occupational History  . Not on file  Social Needs  . Financial resource strain: Not on file  . Food insecurity:    Worry: Not on file    Inability: Not on file  . Transportation needs:     Medical: Not on file    Non-medical: Not on file  Tobacco Use  . Smoking status: Current Every Day Smoker    Packs/day: 0.75    Years: 38.00    Pack years: 28.50    Types: Cigarettes  . Smokeless tobacco: Never Used  Substance and Sexual Activity  . Alcohol use: No    Alcohol/week: 0.0 standard drinks  . Drug use: No  . Sexual activity: Yes  Lifestyle  . Physical activity:    Days per week: Not on file    Minutes per session: Not on file  . Stress: Not on file  Relationships  . Social connections:    Talks on phone: Not on file    Gets together: Not on file    Attends religious service: Not on file    Active member of club or organization: Not on file    Attends meetings of clubs or organizations: Not on file    Relationship status: Not on file  . Intimate partner violence:    Fear of current or ex partner: Not on file    Emotionally abused: Not on file    Physically abused: Not on file    Forced sexual activity: Not on file  Other Topics Concern  . 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 She appears well-developed and well-nourished. No distress.  HENT:  Head: Normocephalic and atraumatic.  Right Ear: Tympanic membrane, external ear and ear canal normal.  Left Ear: Tympanic membrane, external ear and ear canal normal.  Nose: Mucosal edema and rhinorrhea present. Right sinus exhibits maxillary sinus tenderness (mild TTP). Right sinus exhibits no frontal sinus tenderness. Left  sinus exhibits maxillary sinus tenderness (mild TTP). Left sinus exhibits no frontal sinus tenderness.  Mouth/Throat: Oropharynx is clear and moist and mucous membranes are normal. She has dentures.  Mallampati Class III  Eyes: Conjunctivae are normal.  Neck: Normal range of motion.  Cardiovascular: Normal rate, regular rhythm, normal heart sounds and intact distal pulses.  Pulmonary/Chest: Effort normal and breath sounds normal. She has no decreased breath sounds. She has no wheezes. She has no rhonchi. She has no rales.  Abdominal: Soft. Bowel sounds are normal.  She exhibits no ascites. There is tenderness (mild TTP, baseline for patient). There is no rigidity, no rebound, no guarding, no tenderness at McBurney's point and negative Murphy's sign.    Neurological: She is alert and oriented to person, place, and time.  Skin: Skin is warm and dry.  Psychiatric: She has a normal mood and affect.  Vitals reviewed.     Results for orders placed or performed in visit on 08/11/18 (from the past 24 hour(s))  POCT CBC     Status: Abnormal   Collection Time: 08/11/18  1:14 PM  Result Value Ref Range   WBC 12.7 (A) 4.6 - 10.2 K/uL   Lymph, poc 4.4 (A) 0.6 - 3.4   POC LYMPH PERCENT 34.7 10 - 50 %L   MID (cbc) 0.5 0 - 0.9   POC MID % 4.0 0 - 12 %M   POC Granulocyte 7.8 (A) 2 - 6.9   Granulocyte percent 61.3 37 - 80 %G   RBC 5.00 4.04 - 5.48 M/uL   Hemoglobin 14.9 12.2 - 16.2 g/dL   HCT, POC 83.2 54.9 - 47.9 %   MCV 87.9 80 - 97 fL   MCH, POC 29.8 27 - 31.2 pg   MCHC 33.9 31.8 - 35.4 g/dL   RDW, POC 82.6 %   Platelet Count, POC 239 142 - 424 K/uL   MPV 8.7 0 - 99.8 fL     Assessment and Plan :  1. Blood-tinged sputum History and physical exam findings consistent with acute sinusitis  Patient did not bring sample of sputum to visit and did not produce any sputum during exam.  Likely infectious etiology.  Lungs CTAB.  WBC elevated with a left shift.  Hemoglobin stable.  Will treat for  bacterial etiology at this time with antibiotics.  Do not suspect malignancy at this time due to acuity and other symptoms.  However, recommend obtaining chest x-ray if symptoms persist after 7 to 10 days, especially due to her smoking history.  No concern for TB at this time, no recent exposures, however once again if sx persist despite tx, will obtain CXR.  No acute findings noted on abdominal exam, patient reassured.  Given strict return/ED precautions. - POCT CBC 2. Acute non-recurrent maxillary sinusitis - doxycycline (VIBRAMYCIN) 100 MG capsule; Take 1 capsule (100 mg total) by mouth 2 (two) times daily.  Dispense: 20 capsule; Refill: 0 3. Leukocytosis, unspecified type  Benjiman Core PA-C  Primary Care at [facility] 08/11/2018 12:34 PM

## 2018-08-16 ENCOUNTER — Other Ambulatory Visit: Payer: Self-pay | Admitting: *Deleted

## 2018-08-16 ENCOUNTER — Ambulatory Visit: Payer: 59

## 2018-08-16 DIAGNOSIS — R05 Cough: Secondary | ICD-10-CM

## 2018-08-16 DIAGNOSIS — R059 Cough, unspecified: Secondary | ICD-10-CM

## 2018-08-27 DIAGNOSIS — S20211A Contusion of right front wall of thorax, initial encounter: Secondary | ICD-10-CM | POA: Diagnosis not present

## 2018-08-27 DIAGNOSIS — S2020XA Contusion of thorax, unspecified, initial encounter: Secondary | ICD-10-CM | POA: Diagnosis not present

## 2018-10-12 ENCOUNTER — Other Ambulatory Visit: Payer: Self-pay | Admitting: Physician Assistant

## 2018-10-12 NOTE — Telephone Encounter (Signed)
Copied from CRM [id number]. Topic: Quick Communication - Rx Refill/Question >> Oct 12, 2018  4:51 PM Avie Arenas L, NT wrote: Medication: metoprolol tartrate (LOPRESSOR) 50 MG tablet , pravastatin (PRAVACHOL) 40 MG tablet and also  glipiZIDE (GLUCOTROL XL) 10 MG 24 hr tablet   Has the patient contacted their pharmacy? yes  (Agent: If no, request that the patient contact the pharmacy for the refill. (Agent: If yes, when and what did the pharmacy advise?  States they have faxed over a request several times and have not received a response from the practice.   Preferred Pharmacy (with phone number or street name  [facility] [phone] [place], [place] - [phone] (Phone) [phone] (Fax)    Agent: Please be advised that RX refills may take up to 3 business days. We ask that you follow-up with your pharmacy.

## 2018-10-13 MED ORDER — PRAVASTATIN SODIUM 40 MG PO TABS: 40.0000 mg | ORAL_TABLET | Freq: Every day | ORAL | 0 refills | Status: DC

## 2018-10-13 MED ORDER — GLIPIZIDE ER 10 MG PO TB24: 10.0000 mg | ORAL_TABLET | Freq: Every day | ORAL | 0 refills | Status: DC

## 2018-10-13 MED ORDER — METOPROLOL TARTRATE 50 MG PO TABS: 50.0000 mg | ORAL_TABLET | Freq: Two times a day (BID) | ORAL | 0 refills | Status: DC

## 2018-10-13 NOTE — Telephone Encounter (Signed)
Pt's last office visit 09/05/17; no upcoming visits noted; contacted pt regarding this; she schedules transfer of care visit with Dr Leretha Pol, [address] [place], 11/07/18 at 1320; she verbalized understanding; will also refill requested medications to cover pt until this appointment.  Requested Prescriptions  Pending Prescriptions Disp Refills  . metoprolol tartrate (LOPRESSOR) 50 MG tablet 60 tablet 0    Sig: Take 1 tablet (50 mg total) by mouth 2 (two) times daily.     Cardiovascular:  Beta Blockers Failed - 10/12/2018  5:02 PM      Failed - Last BP in normal range    BP Readings from Last 1 Encounters:  08/11/18 140/84         Failed - Valid encounter within last 6 months    Recent Outpatient Visits          2 months ago Blood-tinged sputum   Primary Care at [place], [place] D, PA-C   8 months ago Salivary gland stone   Primary Care at [facility], Myrle Sheng, MD   11 months ago Type 2 diabetes mellitus without complication, without long-term current use of insulin [facility])   [facility] at [facility], Myrle Sheng, MD   1 year ago Type 2 diabetes mellitus without complication, without long-term current use of insulin [facility])   Primary Care at [facility], Myrle Sheng, MD   1 year ago Type 2 diabetes mellitus without complication, without long-term current use of insulin [facility])   Primary Care at [facility], Myrle Sheng, MD      Future Appointments            In 3 weeks Myles Lipps, MD Primary Care at [place], [facility]           Passed - Last Heart Rate in normal range    Pulse Readings from Last 1 Encounters:  08/11/18 93       . pravastatin (PRAVACHOL) 40 MG tablet 30 tablet 0    Sig: Take 1 tablet (40 mg total) by mouth daily.     Cardiovascular:  Antilipid - Statins Failed - 10/12/2018  5:02 PM      Failed - Triglycerides in normal range and within 360 days    Triglycerides  Date Value Ref Range Status  02/11/2018 216 (H) 0 - 149 mg/dL Final         Passed - Total  Cholesterol in normal range and within 360 days    Cholesterol, Total  Date Value Ref Range Status  02/11/2018 164 100 - 199 mg/dL Final         Passed - LDL in normal range and within 360 days    LDL Calculated  Date Value Ref Range Status  02/11/2018 77 0 - 99 mg/dL Final         Passed - HDL in normal range and within 360 days    HDL  Date Value Ref Range Status  02/11/2018 44 >39 mg/dL Final         Passed - Patient is not pregnant      Passed - Valid encounter within last 12 months    Recent Outpatient Visits          2 months ago Blood-tinged sputum   Primary Care at [place], [place] D, PA-C   8 months ago Salivary gland stone   Primary Care at [facility], Myrle Sheng, MD   11 months ago Type 2 diabetes mellitus without complication, without long-term current use  of insulin [facility])   Primary Care at [facility], Myrle Sheng, MD   1 year ago Type 2 diabetes mellitus without complication, without long-term current use of insulin [facility])   Primary Care at [facility], Myrle Sheng, MD   1 year ago Type 2 diabetes mellitus without complication, without long-term current use of insulin [facility])   Primary Care at [facility], Myrle Sheng, MD      Future Appointments            In 3 weeks Myles Lipps, MD Primary Care at [place], [facility]         . glipiZIDE (GLUCOTROL XL) 10 MG 24 hr tablet 30 tablet 0    Sig: Take 1 tablet (10 mg total) by mouth daily with breakfast.     Endocrinology:  Diabetes - Sulfonylureas Failed - 10/12/2018  5:02 PM      Failed - HBA1C is between 0 and 7.9 and within 180 days    Hgb A1c MFr Bld  Date Value Ref Range Status  02/11/2018 6.0 (H) 4.8 - 5.6 % Final    Comment:             Prediabetes: 5.7 - 6.4          Diabetes: >6.4          Glycemic control for adults with diabetes: <7.0          Failed - Valid encounter within last 6 months    Recent Outpatient Visits          2 months ago Blood-tinged sputum   Primary Care at [place], [place] D, PA-C   8 months ago Salivary gland stone   Primary Care at [facility], Myrle Sheng, MD   11 months ago Type 2 diabetes mellitus without complication, without long-term current use of insulin [facility])   Primary Care at [facility], Myrle Sheng, MD   1 year ago Type 2 diabetes mellitus without complication, without long-term current use of insulin [facility])   Primary Care at [facility], Myrle Sheng, MD   1 year ago Type 2 diabetes mellitus without complication, without long-term current use of insulin [facility])   Primary Care at [facility], Myrle Sheng, MD      Future Appointments            In 3 weeks Myles Lipps, MD Primary Care at [place], [facility]

## 2018-10-21 ENCOUNTER — Other Ambulatory Visit: Payer: Self-pay

## 2018-10-21 MED ORDER — LEVOTHYROXINE SODIUM 88 MCG PO TABS: ORAL_TABLET | ORAL | 0 refills | Status: DC

## 2018-11-04 ENCOUNTER — Other Ambulatory Visit: Payer: Self-pay | Admitting: Family Medicine

## 2018-11-06 ENCOUNTER — Other Ambulatory Visit: Payer: Self-pay | Admitting: Physician Assistant

## 2018-11-07 ENCOUNTER — Ambulatory Visit: Payer: Self-pay | Admitting: Family Medicine

## 2018-11-07 ENCOUNTER — Other Ambulatory Visit: Payer: Self-pay | Admitting: Family Medicine

## 2018-11-08 ENCOUNTER — Other Ambulatory Visit: Payer: Self-pay | Admitting: *Deleted

## 2018-11-08 MED ORDER — PRAVASTATIN SODIUM 40 MG PO TABS: 40.0000 mg | ORAL_TABLET | Freq: Every day | ORAL | 0 refills | Status: DC

## 2018-11-08 MED ORDER — GLIPIZIDE ER 10 MG PO TB24: 10.0000 mg | ORAL_TABLET | Freq: Every day | ORAL | 0 refills | Status: DC

## 2018-11-08 MED ORDER — METOPROLOL TARTRATE 50 MG PO TABS: 50.0000 mg | ORAL_TABLET | Freq: Two times a day (BID) | ORAL | 0 refills | Status: DC

## 2018-11-08 NOTE — Progress Notes (Signed)
Requested Prescriptions  Pending Prescriptions Disp Refills  . metoprolol tartrate (LOPRESSOR) 50 MG tablet 60 tablet 0    Sig: Take 1 tablet (50 mg total) by mouth 2 (two) times daily.     There is no refill protocol information for this order    . pravastatin (PRAVACHOL) 40 MG tablet 30 tablet 0    Sig: Take 1 tablet (40 mg total) by mouth daily.     There is no refill protocol information for this order    . glipiZIDE (GLUCOTROL XL) 10 MG 24 hr tablet 30 tablet 0    Sig: Take 1 tablet (10 mg total) by mouth daily with breakfast.     There is no refill protocol information for this order

## 2018-11-09 NOTE — Telephone Encounter (Signed)
Requested medication (s) are due for refill today: No  Requested medication (s) are on the active medication list: Yes  Last refill:  10/21/18  Future visit scheduled: No  Notes to clinic:  Pt. Was a No Show 11/07/18    Requested Prescriptions  Pending Prescriptions Disp Refills   levothyroxine (SYNTHROID, LEVOTHROID) 88 MCG tablet [Pharmacy Med Name: LEVOTHYROXINE 88 MCG TABLET] 90 tablet 1    Sig: TAKE 1 TABLET (88 MCG TOTAL) BY MOUTH DAILY BEFORE BREAKFAST.     Endocrinology:  Hypothyroid Agents Failed - 11/07/2018  1:22 PM      Failed - TSH needs to be rechecked within 3 months after an abnormal result. Refill until TSH is due.      Failed - TSH in normal range and within 360 days    TSH  Date Value Ref Range Status  09/15/2017 0.613 0.450 - 4.500 uIU/mL Final         Passed - Valid encounter within last 12 months    Recent Outpatient Visits          3 months ago Blood-tinged sputum   Primary Care at [place], [place] D, PA-C   9 months ago Salivary gland stone   Primary Care at [facility], Myrle Sheng, MD   11 months ago Type 2 diabetes mellitus without complication, without long-term current use of insulin [facility])   Primary Care at [facility], Myrle Sheng, MD   1 year ago Type 2 diabetes mellitus without complication, without long-term current use of insulin [facility])   Primary Care at [facility], Myrle Sheng, MD   1 year ago Type 2 diabetes mellitus without complication, without long-term current use of insulin [facility])   Primary Care at [facility], Myrle Sheng, MD

## 2018-12-06 ENCOUNTER — Other Ambulatory Visit: Payer: Self-pay | Admitting: Family Medicine

## 2018-12-06 NOTE — Telephone Encounter (Signed)
Requested medication (s) are due for refill today: yes  Requested medication (s) are on the active medication list: yes  Last refill:  11/08/18 for 30 tabs  Future visit scheduled: no  Notes to clinic:  Pt was a no show for appointment 11/07/18. anti lipid - failed  Requested Prescriptions  Pending Prescriptions Disp Refills   pravastatin (PRAVACHOL) 40 MG tablet [Pharmacy Med Name: PRAVASTATIN SODIUM 40 MG TAB] 30 tablet 0    Sig: TAKE 1 TABLET BY MOUTH EVERY DAY     Cardiovascular:  Antilipid - Statins Failed - 12/06/2018  1:30 PM      Failed - Triglycerides in normal range and within 360 days    Triglycerides  Date Value Ref Range Status  02/11/2018 216 (H) 0 - 149 mg/dL Final         Passed - Total Cholesterol in normal range and within 360 days    Cholesterol, Total  Date Value Ref Range Status  02/11/2018 164 100 - 199 mg/dL Final         Passed - LDL in normal range and within 360 days    LDL Calculated  Date Value Ref Range Status  02/11/2018 77 0 - 99 mg/dL Final         Passed - HDL in normal range and within 360 days    HDL  Date Value Ref Range Status  02/11/2018 44 >39 mg/dL Final         Passed - Patient is not pregnant      Passed - Valid encounter within last 12 months    Recent Outpatient Visits          3 months ago Blood-tinged sputum   Primary Care at [place], [place] D, PA-C   9 months ago Salivary gland stone   Primary Care at [facility], Myrle Sheng, MD   1 year ago Type 2 diabetes mellitus without complication, without long-term current use of insulin [facility])   Primary Care at [facility], Myrle Sheng, MD   1 year ago Type 2 diabetes mellitus without complication, without long-term current use of insulin [facility])   Primary Care at [facility], Myrle Sheng, MD   1 year ago Type 2 diabetes mellitus without complication, without long-term current use of insulin [facility])   Primary Care at [facility], Myrle Sheng, MD

## 2018-12-08 ENCOUNTER — Ambulatory Visit (INDEPENDENT_AMBULATORY_CARE_PROVIDER_SITE_OTHER): Payer: 59 | Admitting: Emergency Medicine

## 2018-12-08 DIAGNOSIS — R059 Cough, unspecified: Secondary | ICD-10-CM

## 2018-12-08 DIAGNOSIS — R05 Cough: Secondary | ICD-10-CM

## 2018-12-08 DIAGNOSIS — Z23 Encounter for immunization: Secondary | ICD-10-CM

## 2018-12-08 NOTE — Addendum Note (Signed)
Addended by: Golden Hurter on: 12/08/2018 02:02 PM   Modules accepted: Orders

## 2018-12-08 NOTE — Addendum Note (Signed)
Addended by: Golden Hurter on: 12/08/2018 02:12 PM   Modules accepted: Orders

## 2018-12-11 ENCOUNTER — Other Ambulatory Visit: Payer: Self-pay | Admitting: Family Medicine

## 2018-12-12 NOTE — Telephone Encounter (Signed)
Attempted to call patient to schedule an appointment for her refills. Pt was not in. Left message with husband to have her call and schedule an appointment. He voiced understanding.Marland Kitchen

## 2018-12-15 NOTE — Telephone Encounter (Signed)
No further refills without office visit 

## 2018-12-17 ENCOUNTER — Telehealth: Payer: Self-pay | Admitting: Family Medicine

## 2018-12-17 NOTE — Telephone Encounter (Signed)
Pt. Called to schedule establish care visit with Dr. Creta Levin. Pt. Has been scheduled for March 6th. Pt. Requests temporary refills on Glipizide, prevastatin, levothyroxine, and metoprolol to last until her appt.

## 2018-12-19 ENCOUNTER — Other Ambulatory Visit: Payer: Self-pay | Admitting: Family Medicine

## 2018-12-19 NOTE — Telephone Encounter (Signed)
90 day courtesy refill given until appt on 02/03/19

## 2018-12-20 NOTE — Telephone Encounter (Signed)
Rx has been sent to pharmacy,

## 2018-12-26 ENCOUNTER — Other Ambulatory Visit: Payer: Self-pay | Admitting: Family Medicine

## 2019-01-02 ENCOUNTER — Other Ambulatory Visit: Payer: Self-pay | Admitting: Family Medicine

## 2019-01-02 NOTE — Telephone Encounter (Signed)
Copied from CRM [id number]. Topic: Quick Communication - Rx Refill/Question >> Jan 02, 2019  5:54 PM Jilda Roche wrote: Medication: pravastatin (PRAVACHOL) 40 MG tablet  Has the patient contacted their pharmacy? Yes.   (Agent: If no, request that the patient contact the pharmacy for the refill.) (Agent: If yes, when and what did the pharmacy advise?) office sent Escribe but it failed  Preferred Pharmacy (with phone number or street name): [facility] [phone] G[place] Otto, [place] - [address] [phone] (Phone) [phone] (Fax)    Agent: Please be advised that RX refills may take up to 3 business days. We ask that you follow-up with your pharmacy.

## 2019-01-03 MED ORDER — PRAVASTATIN SODIUM 40 MG PO TABS: 40.0000 mg | ORAL_TABLET | Freq: Every day | ORAL | 0 refills | Status: DC

## 2019-01-12 ENCOUNTER — Other Ambulatory Visit: Payer: Self-pay | Admitting: Family Medicine

## 2019-01-12 NOTE — Telephone Encounter (Signed)
Courtesy refill. Keep appointment for future refills

## 2019-01-12 NOTE — Telephone Encounter (Signed)
Requested medication (s) are due for refill today: yes  Requested medication (s) are on the active medication list: yes  Last refill:  11/09/18   #90  Felicia Hanson  Future visit scheduled: yes  Notes to clinic:  Last TSH was 09/15/17    Requested Prescriptions  Pending Prescriptions Disp Refills   levothyroxine (SYNTHROID, LEVOTHROID) 88 MCG tablet [Pharmacy Med Name: LEVOTHYROXINE 88 MCG TABLET] 30 tablet 0    Sig: TAKE 1 TABLET (88 MCG TOTAL) BY MOUTH DAILY BEFORE BREAKFAST.     Endocrinology:  Hypothyroid Agents Failed - 01/12/2019  1:37 PM      Failed - TSH needs to be rechecked within 3 months after an abnormal result. Refill until TSH is due.      Failed - TSH in normal range and within 360 days    TSH  Date Value Ref Range Status  09/15/2017 0.613 0.450 - 4.500 uIU/mL Final         Passed - Valid encounter within last 12 months    Recent Outpatient Visits          5 months ago Blood-tinged sputum   [facility] at [place], [place] D, PA-C   11 months ago Salivary gland stone   [facility] at [facility], Myrle Sheng, MD   1 year ago Type 2 diabetes mellitus without complication, without long-term current use of insulin [facility])   Primary Care at [facility], Myrle Sheng, MD   1 year ago Type 2 diabetes mellitus without complication, without long-term current use of insulin [facility])   Primary Care at [facility], Myrle Sheng, MD   1 year ago Type 2 diabetes mellitus without complication, without long-term current use of insulin [facility])   Primary Care at [facility], Myrle Sheng, MD      Future Appointments            In 3 weeks Doristine Bosworth, MD [facility] at [place], [facility]

## 2019-02-03 ENCOUNTER — Ambulatory Visit: Payer: 59 | Admitting: Family Medicine

## 2019-02-03 ENCOUNTER — Encounter: Payer: Self-pay | Admitting: Family Medicine

## 2019-02-03 ENCOUNTER — Other Ambulatory Visit: Payer: Self-pay

## 2019-02-03 VITALS — BP 138/80 | HR 86 | Temp 98.0°F | Resp 16 | Ht 61.0 in | Wt 177.0 lb

## 2019-02-03 DIAGNOSIS — Z760 Encounter for issue of repeat prescription: Secondary | ICD-10-CM

## 2019-02-03 DIAGNOSIS — Z7689 Persons encountering health services in other specified circumstances: Secondary | ICD-10-CM

## 2019-02-03 DIAGNOSIS — E034 Atrophy of thyroid (acquired): Secondary | ICD-10-CM

## 2019-02-03 DIAGNOSIS — Z1231 Encounter for screening mammogram for malignant neoplasm of breast: Secondary | ICD-10-CM

## 2019-02-03 DIAGNOSIS — I10 Essential (primary) hypertension: Secondary | ICD-10-CM | POA: Diagnosis not present

## 2019-02-03 DIAGNOSIS — E7800 Pure hypercholesterolemia, unspecified: Secondary | ICD-10-CM | POA: Diagnosis not present

## 2019-02-03 DIAGNOSIS — F411 Generalized anxiety disorder: Secondary | ICD-10-CM

## 2019-02-03 DIAGNOSIS — Z1211 Encounter for screening for malignant neoplasm of colon: Secondary | ICD-10-CM

## 2019-02-03 DIAGNOSIS — E119 Type 2 diabetes mellitus without complications: Secondary | ICD-10-CM

## 2019-02-03 LAB — POCT GLYCOSYLATED HEMOGLOBIN (HGB A1C): Hemoglobin A1C: 8.8 % — AB (ref 4.0–5.6)

## 2019-02-03 MED ORDER — GLIPIZIDE ER 10 MG PO TB24: 10.0000 mg | ORAL_TABLET | Freq: Every day | ORAL | 0 refills | Status: DC

## 2019-02-03 MED ORDER — DULOXETINE HCL 60 MG PO CPEP: 120.0000 mg | ORAL_CAPSULE | Freq: Every day | ORAL | 1 refills | Status: DC

## 2019-02-03 MED ORDER — METOPROLOL TARTRATE 50 MG PO TABS: 50.0000 mg | ORAL_TABLET | Freq: Two times a day (BID) | ORAL | 0 refills | Status: DC

## 2019-02-03 MED ORDER — PRAVASTATIN SODIUM 40 MG PO TABS: 40.0000 mg | ORAL_TABLET | Freq: Every day | ORAL | 0 refills | Status: DC

## 2019-02-03 MED ORDER — SITAGLIPTIN PHOSPHATE 50 MG PO TABS: 50.0000 mg | ORAL_TABLET | Freq: Every day | ORAL | 0 refills | Status: DC

## 2019-02-03 NOTE — Patient Instructions (Addendum)
We recommend that you schedule a mammogram for breast cancer screening. Typically, you do not need a referral to do this. Please contact a local imaging center to schedule your mammogram.  [facility] - [phone]  *ask for the Radiology Department [facility]) - [phone] or [phone]  [facility] - [phone] [facility] - [phone] [facility] - [phone]  *ask for the Radiology Department [facility] - [phone]  *ask for the Radiology Department [facility] - [phone]  *ask for the Mammography Department [facility] - [phone]    If you have lab work done today you will be contacted with your lab results within the next 2 weeks.  If you have not heard from [place] then please contact us. The fastest way to get your results is to register for My Chart.   IF you received an x-ray today, you will receive an invoice from [facility]. Please contact [facility] at [phone] with questions or concerns regarding your invoice.   IF you received labwork today, you will receive an invoice from [place] [facility]. Please contact [facility] at [phone] with questions or concerns regarding your invoice.   Our billing staff will not be able to assist you with questions regarding bills from these companies.  You will be contacted with the lab results as soon as they are available. The fastest way to get your results is to activate your My Chart account. Instructions are located on the last page of this paperwork. If you have not heard from [place] regarding the results in 2 weeks, please contact this office.      Colorectal Cancer Screening  Colorectal cancer screening is a group of tests that are used to check for colorectal cancer before symptoms develop. Colorectal refers to the colon and rectum. The colon and rectum are located at the end of the  digestive tract and carry bowel movements out of the body. Who should have screening? All adults starting at age 51 until age 11 should have screening. Your health care provider may recommend screening at age 92. You will have tests every 1-10 years, depending on your results and the type of screening test. You may have screening tests starting at an earlier age, or more frequently than other people, if you have any of the following risk factors:  A personal or family history of colorectal cancer or abnormal growths (polyps).  Inflammatory bowel disease, such as ulcerative colitis or Crohn's disease.  A history of having radiation treatment to the abdomen or pelvic area for cancer.  Colorectal cancer symptoms, such as changes in bowel habits or blood in your stool.  A type of colon cancer syndrome that is passed from parent to child (hereditary), such as: ? Lynch syndrome. ? Familial adenomatous polyposis. ? Turcot syndrome. ? Peutz-Jeghers syndrome. Screening recommendations for adults who are 52-66 years old vary depending on health. How is screening done? There are several types of colorectal screening tests. You may have one or more of the following:  Guaiac-based fecal occult blood testing. For this test, a stool (feces) sample is checked for hidden (occult) blood, which could be a sign of colorectal cancer.  Fecal immunochemical test (FIT). For this test, a stool sample is checked for blood, which could be a sign of colorectal cancer.  Stool DNA test. For this test, a stool sample is checked for blood and changes in DNA that could lead  to colorectal cancer.  Sigmoidoscopy. During this test, a thin, flexible tube with a camera on the end (sigmoidoscope) is used to examine the rectum and the lower colon.  Colonoscopy. During this test, a long, flexible tube with a camera on the end (colonoscope) is used to examine the entire colon and rectum. With a colonoscopy, it is possible to  take a sample of tissue (biopsy) and remove small polyps during the test.  Virtual colonoscopy. Instead of a colonoscope, this type of colonoscopy uses X-rays (CT scan) and computers to produce images of the colon and rectum. What are the benefits of screening? Screening reduces your risk for colorectal cancer and can help identify cancer at an early stage, when the cancer can be removed or treated more easily. It is common for polyps to form in the lining of the colon, especially as you age. These polyps may be cancerous or become cancerous over time. Screening can identify these polyps. What are the risks of screening? Each screening test may have different risks.  Stool sample tests have fewer risks than other types of screening tests. However, you may need more tests to confirm results from a stool sample test.  Screening tests that involve X-rays expose you to low levels of radiation, which may slightly increase your cancer risk. The benefit of detecting cancer outweighs the slight increase in risk.  Screening tests such as sigmoidoscopy and colonoscopy may place you at risk for bleeding, intestinal damage, infection, or a reaction to medicines given during the exam. Talk with your health care provider to understand your risk for colorectal cancer and to make a screening plan that is right for you. Questions to ask your health care provider  When should I start colorectal cancer screening?  What is my risk for colorectal cancer?  How often do I need screening?  Which screening tests do I need?  How do I get my test results?  What do my results mean? Where to find more information Learn more about colorectal cancer screening from:  The American Cancer Society: www.cancer.org  The [employer]: www.cancer.gov Summary  Colorectal cancer screening is a group of tests used to check for colorectal cancer before symptoms develop.  Screening reduces your risk for  colorectal cancer and can help identify cancer at an early stage, when the cancer can be removed or treated more easily.  All adults starting at age 87 until age 53 should have screening. Your health care provider may recommend screening at age 80.  You may have screening tests starting at an earlier age, or more frequently than other people, if you have certain risk factors.  Talk with your health care provider to understand your risk for colorectal cancer and to make a screening plan that is right for you. This information is not intended to replace advice given to you by your health care provider. Make sure you discuss any questions you have with your health care provider. Document Released: 05/06/2010 Document Revised: 10/29/2017 Document Reviewed: 08/18/2017 Elsevier Interactive Patient Education  2019 [employer].

## 2019-02-03 NOTE — Progress Notes (Signed)
Established Patient Office Visit  Subjective:  Patient ID: Felicia Hanson, female    DOB: [birth year]  Age: 63 y.o. MRN: [id number]  CC:  Chief Complaint  Patient presents with  . Chronic Conditions    pt states she need to est. new PCP prev. smith   . Medication Management    HPI Felicia Hanson presents for   Diabetes Mellitus: Patient presents for follow up of diabetes. Symptoms: patient DENIES hyperglycemia, nausea, paresthesia of the feet, polydipsia and polyuria. Symptoms have stabilized.   Evaluation to date has been included: hemoglobin A1C.   She is on bydureon which is no longer covered She states that her home blood glucose is typically 100-120s but the highest reading she gets is 220 She reports that she is doing bydureon and glipizide She denies hypoglycemia  Hypothyroidism: Patient presents for evaluation of thyroid function. Symptoms consist of denies fatigue, weight changes, heat/cold intolerance, bowel/skin changes or CVS symptoms. SHE REPORTS HAIR THINNING.  The problem has been stable.  Previous thyroid studies include TSH. The hypothyroidism is due to hypothyroidism.  Lab Results  Component Value Date   TSH 0.613 09/15/2017   Anxiety and Depression  She is having difficulty with sleep She states that she helps with her grandchildren She has a 83 yo granddaughter with bipolar disorder  She denies suicidal ideation She rarely takes alprazolam which helps panic attacks but makes her feel down, tired and depressed She takes 4 a year for panic attacks   GAD 7 : Generalized Anxiety Score 02/03/2019  Nervous, Anxious, on Edge 1  Control/stop worrying 2  Worry too much - different things 2  Trouble relaxing 3  Restless 2  Easily annoyed or irritable 0  Afraid - awful might happen 1  Total GAD 7 Score 11  Anxiety Difficulty Very difficult     Depression screen [facility] 2/9 02/03/2019 08/11/2018 02/11/2018 11/15/2017 10/06/2017  Decreased Interest '2 3 2 1 '$ 0  Down,  Depressed, Hopeless '2 3 2 '$ 0 0  PHQ - 2 Score '4 6 4 1 '$ 0  Altered sleeping '2 3 1 2 '$ -  Tired, decreased energy '2 3 2 '$ 0 -  Change in appetite 0 0 2 2 -  Feeling bad or failure about yourself  - '2 2 1 '$ -  Trouble concentrating 2 0 0 0 -  Moving slowly or fidgety/restless 0 3 0 1 -  Suicidal thoughts 0 0 0 0 -  PHQ-9 Score '10 17 11 7 '$ -  Difficult doing work/chores - Extremely dIfficult - Not difficult at all -   Hypertension: Patient here for follow-up of elevated blood pressure. She is exercising and is adherent to low salt diet.  Blood pressure is well controlled at home. Cardiac symptoms none. Patient denies chest pain, chest pressure/discomfort, dyspnea and irregular heart beat.  Cardiovascular risk factors: diabetes mellitus, hypertension and smoking/ tobacco exposure. Use of agents associated with hypertension: none. History of target organ damage: none. She gets occasional dizziness She takes metoprolol '25mg'$  BID  BP Readings from Last 3 Encounters:  02/03/19 (!) 150/80  08/11/18 140/84  02/11/18 128/86     Past Medical History:  Diagnosis Date  . Anxiety   . Depression   . Diabetes mellitus without complication ([place])   . Hyperlipidemia   . Hypertension   . Thyroid disease     Past Surgical History:  Procedure Laterality Date  . BREAST EXCISIONAL BIOPSY Left   . CHOLECYSTECTOMY    . COLON SURGERY  diverticulitis with perforation; s/p colon resection.  . Colonoscopy    . TUBAL LIGATION      Family History  Problem Relation Age of Onset  . Cancer Mother        unknown primary  . Hyperlipidemia Mother   . Breast cancer Paternal Aunt     Social History   Socioeconomic History  . Marital status: Married    Spouse name: Not on file  . Number of children: Not on file  . Years of education: Not on file  . Highest education level: Not on file  Occupational History  . Not on file  Social Needs  . Financial resource strain: Not on file  . Food insecurity:     Worry: Not on file    Inability: Not on file  . Transportation needs:    Medical: Not on file    Non-medical: Not on file  Tobacco Use  . Smoking status: Current Every Day Smoker    Packs/day: 0.75    Years: 38.00    Pack years: 28.50    Types: Cigarettes  . Smokeless tobacco: Never Used  Substance and Sexual Activity  . Alcohol use: No    Alcohol/week: 0.0 standard drinks  . Drug use: No  . Sexual activity: Yes  Lifestyle  . Physical activity:    Days per week: Not on file    Minutes per session: Not on file  . Stress: Not on file  Relationships  . Social connections:    Talks on phone: Not on file    Gets together: Not on file    Attends religious service: Not on file    Active member of club or organization: Not on file    Attends meetings of clubs or organizations: Not on file    Relationship status: Not on file  . Intimate partner violence:    Fear of current or ex partner: Not on file    Emotionally abused: Not on file    Physically abused: Not on file    Forced sexual activity: Not on file  Other Topics Concern  . 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 ACCU-CHEK AVIVA PLUS test strip daily. for testing  2  . ALPRAZolam (XANAX) 1 MG tablet Take 0.5 mg by mouth 3 (three) times daily as needed for anxiety or sleep.     Marland Kitchen amphetamine-dextroamphetamine (ADDERALL) 20 MG tablet Reported on 11/26/2015  0  . aspirin 81 MG tablet Take 81 mg by mouth daily.    . DULoxetine (CYMBALTA) 60 MG capsule Take 2 capsules (120 mg total) by mouth daily. 180 capsule 1  . Exenatide ER (BYDUREON BCISE) 2 MG/0.85ML AUIJ Inject 2 mg into the skin once a week. 3.4 mL 2  . glipiZIDE (GLUCOTROL XL) 10 MG 24 hr tablet TAKE  1 TABLET (10 MG TOTAL) BY MOUTH DAILY WITH BREAKFAST. 21 tablet 0  . levothyroxine (SYNTHROID, LEVOTHROID) 88 MCG tablet TAKE 1 TABLET (88 MCG TOTAL) BY MOUTH DAILY BEFORE BREAKFAST. 30 tablet 0  . metoprolol tartrate (LOPRESSOR) 50 MG tablet TAKE 1 TABLET BY MOUTH TWICE A  DAY 180 tablet 0  . pravastatin (PRAVACHOL) 40 MG tablet Take 1 tablet (40 mg total) by mouth daily. 90 tablet 0  . doxycycline (VIBRAMYCIN) 100 MG capsule Take 1 capsule (100 mg total) by mouth 2 (two) times daily. 20 capsule 0   No facility-administered medications prior to visit.     No Known Allergies  ROS Review of Systems See hpi Review of Systems  Constitutional: Negative for activity change, appetite change, chills and fever.  HENT: Negative for congestion, nosebleeds, trouble swallowing and voice change.   Respiratory: Negative for cough, shortness of breath and wheezing.   Gastrointestinal: Negative for diarrhea, nausea and vomiting.  Genitourinary: Negative for difficulty urinating, dysuria, flank pain and hematuria.  Musculoskeletal: Negative for back pain, joint swelling and neck pain.  Neurological: Negative for dizziness, speech difficulty, light-headedness and numbness.  See HPI. All other review of systems negative.     Objective:    Physical Exam  BP (!) 150/80   Pulse 86   Temp 98 F (36.7 C) (Oral)   Resp 16   Ht '5\' 1"'$  (1.549 m)   Wt 177 lb (80.3 kg)   SpO2 98%   BMI 33.44 kg/m  Wt Readings from Last 3 Encounters:  02/03/19 177 lb (80.3 kg)  08/11/18 172 lb 9.6 oz (78.3 kg)  02/11/18 167 lb (75.8 kg)   Physical Exam  Constitutional: Oriented to person, place, and time. Appears well-developed and well-nourished.  HENT:  Head: Normocephalic and atraumatic.  Eyes: Conjunctivae and EOM are normal.  Cardiovascular: Normal rate, regular rhythm, normal heart sounds and intact distal pulses.  No murmur heard. Pulmonary/Chest: Effort normal and breath sounds normal. No stridor. No  respiratory distress. Has no wheezes.  Neurological: Is alert and oriented to person, place, and time.  Skin: Skin is warm. Capillary refill takes less than 2 seconds.  Psychiatric: Has a normal mood and affect. Behavior is normal. Judgment and thought content normal.    Health Maintenance Due  Topic Date Due  . PAP SMEAR-Modifier  12/12/1975  . COLONOSCOPY  12/11/2004  . HEMOGLOBIN A1C  08/14/2018  . URINE MICROALBUMIN  09/15/2018  . OPHTHALMOLOGY EXAM  10/30/2018    There are no preventive care reminders to display for this patient.  Lab Results  Component Value Date   TSH 0.613 09/15/2017   Lab Results  Component Value Date   WBC 12.7 (A) 08/11/2018   HGB 14.9 08/11/2018   HCT 43.9 08/11/2018   MCV 87.9 08/11/2018   PLT 268 02/11/2018   Lab Results  Component Value Date   NA 140 02/11/2018   K 4.3 02/11/2018   CO2 23 02/11/2018   GLUCOSE 99 02/11/2018   BUN 7 (L) 02/11/2018   CREATININE 0.81 02/11/2018   BILITOT 0.4 02/11/2018   ALKPHOS 86 02/11/2018   AST 34 02/11/2018   ALT 20 02/11/2018   PROT 7.6 02/11/2018   ALBUMIN 4.4 02/11/2018   CALCIUM 9.6 02/11/2018   ANIONGAP 10 09/13/2016   Lab Results  Component Value Date   CHOL 164 02/11/2018   Lab Results  Component Value Date   HDL 44 02/11/2018   Lab Results  Component Value Date   LDLCALC 77 02/11/2018   Lab Results  Component Value Date   TRIG 216 (H) 02/11/2018   Lab Results  Component Value Date   CHOLHDL 3.7 02/11/2018   Lab Results  Component Value Date   HGBA1C 6.0 (H) 02/11/2018      Assessment &  Plan:   Problem List Items Addressed This Visit      Cardiovascular and Mediastinum   Essential hypertension, benign - Primary - bp stable on current meds Patient's blood pressure is at goal of 139/89 or less. Condition is stable. Continue current medications and treatment plan. I recommend that you exercise for 30-45 minutes 5 days a week. I also recommend a balanced diet with  fruits and vegetables every day, lean meats, and little fried foods. The DASH diet (you can find this online) is a good example of this.    Relevant Orders   Microalbumin, urine     Endocrine   Hypothyroidism-  Will add on tsh to labs to verigy    Relevant Orders   Microalbumin, urine   CMP14+EGFR   Type 2 diabetes mellitus without complication, without long-term current use of insulin ([place])-  Goal deteriorated bydureon not on formulary Will add januvia instead    Relevant Orders   Microalbumin, urine   CBC with Differential/Platelet   Lipid panel   CMP14+EGFR   POCT glycosylated hemoglobin (Hb A1C)     Other   Generalized anxiety disorder   Pure hypercholesterolemia  -  Will check levels, discussed goals   Relevant Orders   Lipid panel    Other Visit Diagnoses    Breast cancer screening by mammogram       Relevant Orders   MM Digital Screening   Screening for malignant neoplasm of colon       Relevant Orders   Ambulatory referral to Gastroenterology      No orders of the defined types were placed in this encounter.   Follow-up: Return in about 3 months (around 05/06/2019) for physical exam with pap smear.    Forrest Moron, MD

## 2019-02-04 LAB — LIPID PANEL
Chol/HDL Ratio: 3.8 ratio (ref 0.0–4.4)
Cholesterol, Total: 150 mg/dL (ref 100–199)
HDL: 40 mg/dL (ref >39)
LDL Calculated: 64 mg/dL (ref 0–99)
Triglycerides: 231 mg/dL — ABNORMAL HIGH (ref 0–149)
VLDL Cholesterol Cal: 46 mg/dL — ABNORMAL HIGH (ref 5–40)

## 2019-02-04 LAB — CBC WITH DIFFERENTIAL/PLATELET
Basophils Absolute: 0.1 10*3/uL (ref 0.0–0.2)
Basos: 1 %
EOS (ABSOLUTE): 0.6 10*3/uL — ABNORMAL HIGH (ref 0.0–0.4)
Eos: 6 %
Hematocrit: 39.9 % (ref 34.0–46.6)
Hemoglobin: 13.6 g/dL (ref 11.1–15.9)
Immature Grans (Abs): 0 10*3/uL (ref 0.0–0.1)
Immature Granulocytes: 0 %
Lymphocytes Absolute: 3.9 10*3/uL — ABNORMAL HIGH (ref 0.7–3.1)
Lymphs: 38 %
MCH: 28.8 pg (ref 26.6–33.0)
MCHC: 34.1 g/dL (ref 31.5–35.7)
MCV: 85 fL (ref 79–97)
Monocytes Absolute: 1 10*3/uL — ABNORMAL HIGH (ref 0.1–0.9)
Monocytes: 10 %
Neutrophils Absolute: 4.7 10*3/uL (ref 1.4–7.0)
Neutrophils: 45 %
Platelets: 217 10*3/uL (ref 150–450)
RBC: 4.72 x10E6/uL (ref 3.77–5.28)
RDW: 13 % (ref 11.7–15.4)
WBC: 10.3 10*3/uL (ref 3.4–10.8)

## 2019-02-04 LAB — CMP14+EGFR
ALT: 16 IU/L (ref 0–32)
AST: 30 IU/L (ref 0–40)
Albumin/Globulin Ratio: 1.1 — ABNORMAL LOW (ref 1.2–2.2)
Albumin: 3.8 g/dL (ref 3.8–4.8)
Alkaline Phosphatase: 106 IU/L (ref 39–117)
BUN/Creatinine Ratio: 10 — ABNORMAL LOW (ref 12–28)
BUN: 8 mg/dL (ref 8–27)
Bilirubin Total: 0.3 mg/dL (ref 0.0–1.2)
CO2: 23 mmol/L (ref 20–29)
Calcium: 9.1 mg/dL (ref 8.7–10.3)
Chloride: 97 mmol/L (ref 96–106)
Creatinine, Ser: 0.77 mg/dL (ref 0.57–1.00)
GFR calc Af Amer: 94 mL/min/{1.73_m2} (ref >59)
GFR calc non Af Amer: 82 mL/min/{1.73_m2} (ref >59)
Globulin, Total: 3.4 g/dL (ref 1.5–4.5)
Glucose: 309 mg/dL — ABNORMAL HIGH (ref 65–99)
Potassium: 4.5 mmol/L (ref 3.5–5.2)
Sodium: 137 mmol/L (ref 134–144)
Total Protein: 7.2 g/dL (ref 6.0–8.5)

## 2019-02-04 LAB — HEMOGLOBIN A1C
Est. average glucose Bld gHb Est-mCnc: 209 mg/dL
Hgb A1c MFr Bld: 8.9 % — ABNORMAL HIGH (ref 4.8–5.6)

## 2019-02-04 LAB — MICROALBUMIN, URINE: Microalbumin, Urine: 8.7 ug/mL

## 2019-02-09 LAB — SPECIMEN STATUS REPORT

## 2019-02-09 LAB — TSH: TSH: 1.18 u[IU]/mL (ref 0.450–4.500)

## 2019-02-12 ENCOUNTER — Other Ambulatory Visit: Payer: Self-pay | Admitting: Family Medicine

## 2019-02-15 ENCOUNTER — Ambulatory Visit: Payer: Self-pay | Admitting: *Deleted

## 2019-02-15 NOTE — Telephone Encounter (Signed)
Summary: medication question   needs to know if she can take glipizide with sitaGLIPtin (JANUVIA) 50 MG tablet      Call to patient- she is not clear on when to take her Januvia- patient is going to take it at night which is when she took her injection- and she takes the glipizide in the morning.  Patient advised there is no restriction as food or drink when taking medication. Will check and see when PCP and find out what time of day would be best for her to take medication expect call back with advisement. Patient is good with plan.  Reason for Disposition . Caller has NON-URGENT medication question about med that PCP prescribed and triager unable to answer question    When to take Januvia- best time of day?  Protocols used: MEDICATION QUESTION CALL-A-AH

## 2019-02-17 NOTE — Telephone Encounter (Signed)
Patient wants to know when she should be taking the Guinea

## 2019-03-22 ENCOUNTER — Telehealth: Payer: Self-pay | Admitting: Family Medicine

## 2019-03-22 NOTE — Telephone Encounter (Signed)
Patient is very nauseous, no fever, no cough, no headache, has diarrhea, and feeling "just miserable" she does not want to have to come into the office but is requesting that phenergan be sent in for her.

## 2019-03-23 NOTE — Telephone Encounter (Signed)
Needs tele med apt for medication.

## 2019-04-06 ENCOUNTER — Other Ambulatory Visit: Payer: Self-pay

## 2019-04-06 ENCOUNTER — Encounter: Payer: 59 | Admitting: Family Medicine

## 2019-04-06 NOTE — Patient Instructions (Signed)
° ° ° °  If you have lab work done today you will be contacted with your lab results within the next 2 weeks.  If you have not heard from us then please contact us. The fastest way to get your results is to register for My Chart. ° ° °IF you received an x-ray today, you will receive an invoice from [facility]. Please contact [facility] at [phone] with questions or concerns regarding your invoice.  ° °IF you received labwork today, you will receive an invoice from [facility]. Please contact [facility] at [phone] with questions or concerns regarding your invoice.  ° °Our billing staff will not be able to assist you with questions regarding bills from these companies. ° °You will be contacted with the lab results as soon as they are available. The fastest way to get your results is to activate your My Chart account. Instructions are located on the last page of this paperwork. If you have not heard from us regarding the results in 2 weeks, please contact this office. °  ° ° ° °

## 2019-04-06 NOTE — Progress Notes (Signed)
CC: medication refills -per pt no refills needed at this time.  Pt would like to discuss left pinky finger numbness x last 2-2 1/2 weeks pt concerned since she has diabetes. Per pt chest pain x 3 weeks ago 1 episode but went away with tums and pepcid AC and no more episodes of CP since.  No travel outside the [place] or [place] in the last 3 weeks. Depression score: 15

## 2019-04-06 NOTE — Progress Notes (Signed)
This encounter was created in error - please disregard.

## 2019-04-26 ENCOUNTER — Ambulatory Visit: Payer: 59

## 2019-05-04 ENCOUNTER — Ambulatory Visit: Payer: 59 | Admitting: Family Medicine

## 2019-05-10 ENCOUNTER — Other Ambulatory Visit: Payer: Self-pay | Admitting: Family Medicine

## 2019-05-10 NOTE — Telephone Encounter (Signed)
Is Dr. Nolon Rod taking over this rx

## 2019-05-26 ENCOUNTER — Other Ambulatory Visit: Payer: Self-pay | Admitting: Family Medicine

## 2019-05-26 DIAGNOSIS — E119 Type 2 diabetes mellitus without complications: Secondary | ICD-10-CM

## 2019-05-26 NOTE — Telephone Encounter (Signed)
[phone] Pt is asking for this to be filled asap.  She is out

## 2019-05-26 NOTE — Telephone Encounter (Signed)
Requested Prescriptions  Pending Prescriptions Disp Refills  . JANUVIA 50 MG tablet [Pharmacy Med Name: JANUVIA 50 MG TABLET] 60 tablet 0    Sig: TAKE 1 TABLET BY MOUTH EVERY DAY     Endocrinology:  Diabetes - DPP-4 Inhibitors Failed - 05/26/2019  2:59 PM      Failed - HBA1C is between 0 and 7.9 and within 180 days    Hgb A1c MFr Bld  Date Value Ref Range Status  02/03/2019 8.9 (H) 4.8 - 5.6 % Final    Comment:             Prediabetes: 5.7 - 6.4          Diabetes: >6.4          Glycemic control for adults with diabetes: <7.0          Passed - Cr in normal range and within 360 days    Creat  Date Value Ref Range Status  08/09/2015 0.77 0.50 - 0.99 mg/dL Final   Creatinine, Ser  Date Value Ref Range Status  02/03/2019 0.77 0.57 - 1.00 mg/dL Final         Passed - Valid encounter within last 6 months    Recent Outpatient Visits          3 months ago Essential hypertension, benign   Primary Care at [facility], Arlie Solomons, MD   9 months ago Blood-tinged sputum   Primary Care at [place], [place] D, PA-C   1 year ago Salivary gland stone   Primary Care at [facility], Renette Butters, MD   1 year ago Type 2 diabetes mellitus without complication, without long-term current use of insulin [facility])   Primary Care at [facility], Renette Butters, MD   1 year ago Type 2 diabetes mellitus without complication, without long-term current use of insulin [facility])   Primary Care at [facility], Renette Butters, MD

## 2019-06-17 ENCOUNTER — Other Ambulatory Visit: Payer: Self-pay | Admitting: Family Medicine

## 2019-06-17 DIAGNOSIS — E119 Type 2 diabetes mellitus without complications: Secondary | ICD-10-CM

## 2019-06-20 ENCOUNTER — Telehealth: Payer: Self-pay | Admitting: Family Medicine

## 2019-06-20 NOTE — Telephone Encounter (Signed)
LVM to get email to send a [email or web address]

## 2019-06-21 ENCOUNTER — Ambulatory Visit: Payer: 59 | Admitting: Family Medicine

## 2019-06-21 ENCOUNTER — Other Ambulatory Visit: Payer: Self-pay | Admitting: Family Medicine

## 2019-06-21 ENCOUNTER — Telehealth: Payer: 59 | Admitting: Family Medicine

## 2019-06-22 ENCOUNTER — Telehealth: Payer: Self-pay

## 2019-06-22 NOTE — Telephone Encounter (Signed)
Spoke with pt advised to bring sugar reading to appt on 06/26/2019 at 11am and if diarrhea is associated with fever to go Urgent care.  Per pt lg temp of 99 for 1 week of the 2 weeks of diarrhea but no lg temps at all now. Pt c/o ha's but no fever, cough, sneezing, cp.  I advised if she develops any of these symptoms to go to Urgent Care for tx. Pt agreeable. Dgaddy, CMA

## 2019-06-25 ENCOUNTER — Other Ambulatory Visit: Payer: Self-pay | Admitting: Family Medicine

## 2019-06-25 NOTE — Telephone Encounter (Signed)
Requested Prescriptions  Pending Prescriptions Disp Refills  . pravastatin (PRAVACHOL) 40 MG tablet [Pharmacy Med Name: PRAVASTATIN SODIUM 40 MG TAB] 90 tablet 2    Sig: TAKE 1 TABLET BY MOUTH EVERY DAY     Cardiovascular:  Antilipid - Statins Failed - 06/25/2019 10:08 AM      Failed - Triglycerides in normal range and within 360 days    Triglycerides  Date Value Ref Range Status  02/03/2019 231 (H) 0 - 149 mg/dL Final         Passed - Total Cholesterol in normal range and within 360 days    Cholesterol, Total  Date Value Ref Range Status  02/03/2019 150 100 - 199 mg/dL Final         Passed - LDL in normal range and within 360 days    LDL Calculated  Date Value Ref Range Status  02/03/2019 64 0 - 99 mg/dL Final         Passed - HDL in normal range and within 360 days    HDL  Date Value Ref Range Status  02/03/2019 40 >39 mg/dL Final         Passed - Patient is not pregnant      Passed - Valid encounter within last 12 months    Recent Outpatient Visits          4 months ago Essential hypertension, benign   Primary Care at [facility], Arlie Solomons, MD   10 months ago Blood-tinged sputum   Primary Care at [place], [place] D, PA-C   1 year ago Salivary gland stone   Primary Care at [facility], Renette Butters, MD   1 year ago Type 2 diabetes mellitus without complication, without long-term current use of insulin [facility])   Primary Care at [facility], Renette Butters, MD   1 year ago Type 2 diabetes mellitus without complication, without long-term current use of insulin [facility])   Primary Care at [facility], Renette Butters, MD

## 2019-06-26 ENCOUNTER — Other Ambulatory Visit: Payer: Self-pay

## 2019-06-26 ENCOUNTER — Ambulatory Visit (INDEPENDENT_AMBULATORY_CARE_PROVIDER_SITE_OTHER): Payer: 59 | Admitting: Family Medicine

## 2019-06-26 ENCOUNTER — Encounter: Payer: Self-pay | Admitting: Family Medicine

## 2019-06-26 VITALS — BP 160/88 | HR 97 | Temp 98.7°F | Resp 17 | Ht 61.0 in | Wt 169.6 lb

## 2019-06-26 DIAGNOSIS — E1165 Type 2 diabetes mellitus with hyperglycemia: Secondary | ICD-10-CM

## 2019-06-26 DIAGNOSIS — R8290 Unspecified abnormal findings in urine: Secondary | ICD-10-CM | POA: Diagnosis not present

## 2019-06-26 DIAGNOSIS — K432 Incisional hernia without obstruction or gangrene: Secondary | ICD-10-CM

## 2019-06-26 DIAGNOSIS — E039 Hypothyroidism, unspecified: Secondary | ICD-10-CM

## 2019-06-26 DIAGNOSIS — N390 Urinary tract infection, site not specified: Secondary | ICD-10-CM | POA: Diagnosis not present

## 2019-06-26 DIAGNOSIS — F419 Anxiety disorder, unspecified: Secondary | ICD-10-CM

## 2019-06-26 DIAGNOSIS — F411 Generalized anxiety disorder: Secondary | ICD-10-CM

## 2019-06-26 LAB — POCT URINALYSIS DIP (MANUAL ENTRY)
Bilirubin, UA: NEGATIVE
Glucose, UA: 500 mg/dL — AB
Ketones, POC UA: NEGATIVE mg/dL
Nitrite, UA: POSITIVE — AB
Spec Grav, UA: 1.02 (ref 1.010–1.025)
Urobilinogen, UA: 1 E.U./dL
pH, UA: 6.5 (ref 5.0–8.0)

## 2019-06-26 LAB — POCT GLYCOSYLATED HEMOGLOBIN (HGB A1C): Hemoglobin A1C: 14 % — AB (ref 4.0–5.6)

## 2019-06-26 LAB — GLUCOSE, POCT (MANUAL RESULT ENTRY): POC Glucose: 265 mg/dl — AB (ref 70–99)

## 2019-06-26 MED ORDER — LISINOPRIL 2.5 MG PO TABS: 2.5000 mg | ORAL_TABLET | Freq: Every day | ORAL | 1 refills | Status: DC

## 2019-06-26 MED ORDER — SULFAMETHOXAZOLE-TRIMETHOPRIM 800-160 MG PO TABS: 1.0000 | ORAL_TABLET | Freq: Two times a day (BID) | ORAL | 0 refills | Status: DC

## 2019-06-26 MED ORDER — TRULICITY 0.75 MG/0.5ML ~~LOC~~ SOAJ: 0.7500 mg | SUBCUTANEOUS | 1 refills | Status: DC

## 2019-06-26 NOTE — Patient Instructions (Addendum)
BP Readings from Last 3 Encounters:  06/26/19 (!) 160/88  02/03/19 138/80  08/11/18 140/84       If you have lab work done today you will be contacted with your lab results within the next 2 weeks.  If you have not heard from [place] then please contact us. The fastest way to get your results is to register for My Chart.   IF you received an x-ray today, you will receive an invoice from [facility]. Please contact [facility] at [phone] with questions or concerns regarding your invoice.   IF you received labwork today, you will receive an invoice from [facility]. Please contact [facility] at [phone] with questions or concerns regarding your invoice.   Our billing staff will not be able to assist you with questions regarding bills from these companies.  You will be contacted with the lab results as soon as they are available. The fastest way to get your results is to activate your My Chart account. Instructions are located on the last page of this paperwork. If you have not heard from [place] regarding the results in 2 weeks, please contact this office.     Carbohydrate Counting for Diabetes Mellitus, Adult  Carbohydrate counting is a method of keeping track of how many carbohydrates you eat. Eating carbohydrates naturally increases the amount of sugar (glucose) in the blood. Counting how many carbohydrates you eat helps keep your blood glucose within normal limits, which helps you manage your diabetes (diabetes mellitus). It is important to know how many carbohydrates you can safely have in each meal. This is different for every person. A diet and nutrition specialist (registered dietitian) can help you make a meal plan and calculate how many carbohydrates you should have at each meal and snack. Carbohydrates are found in the following foods:  Grains, such as breads and cereals.  Dried beans and soy products.  Starchy vegetables, such as potatoes, peas, and  corn.  Fruit and fruit juices.  Milk and yogurt.  Sweets and snack foods, such as cake, cookies, candy, chips, and soft drinks. How do I count carbohydrates? There are two ways to count carbohydrates in food. You can use either of the methods or a combination of both. Reading "Nutrition Facts" on packaged food The "Nutrition Facts" list is included on the labels of almost all packaged foods and beverages in the [place] It includes:  The serving size.  Information about nutrients in each serving, including the grams (g) of carbohydrate per serving. To use the "Nutrition Facts":  Decide how many servings you will have.  Multiply the number of servings by the number of carbohydrates per serving.  The resulting number is the total amount of carbohydrates that you will be having. Learning standard serving sizes of other foods When you eat carbohydrate foods that are not packaged or do not include "Nutrition Facts" on the label, you need to measure the servings in order to count the amount of carbohydrates:  Measure the foods that you will eat with a food scale or measuring cup, if needed.  Decide how many standard-size servings you will eat.  Multiply the number of servings by 15. Most carbohydrate-rich foods have about 15 g of carbohydrates per serving. ? For example, if you eat 8 oz (170 g) of strawberries, you will have eaten 2 servings and 30 g of carbohydrates (2 servings x 15 g = 30 g).  For foods that have more than one food mixed, such as soups and casseroles,  you must count the carbohydrates in each food that is included. The following list contains standard serving sizes of common carbohydrate-rich foods. Each of these servings has about 15 g of carbohydrates:   hamburger bun or  English muffin.   oz (15 mL) syrup.   oz (14 g) jelly.  1 slice of bread.  1 six-inch tortilla.  3 oz (85 g) cooked rice or pasta.  4 oz (113 g) cooked dried beans.  4 oz (113 g)  starchy vegetable, such as peas, corn, or potatoes.  4 oz (113 g) hot cereal.  4 oz (113 g) mashed potatoes or  of a large baked potato.  4 oz (113 g) canned or frozen fruit.  4 oz (120 mL) fruit juice.  4-6 crackers.  6 chicken nuggets.  6 oz (170 g) unsweetened dry cereal.  6 oz (170 g) plain fat-free yogurt or yogurt sweetened with artificial sweeteners.  8 oz (240 mL) milk.  8 oz (170 g) fresh fruit or one small piece of fruit.  24 oz (680 g) popped popcorn. Example of carbohydrate counting Sample meal  3 oz (85 g) chicken breast.  6 oz (170 g) brown rice.  4 oz (113 g) corn.  8 oz (240 mL) milk.  8 oz (170 g) strawberries with sugar-free whipped topping. Carbohydrate calculation 1. Identify the foods that contain carbohydrates: ? Rice. ? Corn. ? Milk. ? Strawberries. 2. Calculate how many servings you have of each food: ? 2 servings rice. ? 1 serving corn. ? 1 serving milk. ? 1 serving strawberries. 3. Multiply each number of servings by 15 g: ? 2 servings rice x 15 g = 30 g. ? 1 serving corn x 15 g = 15 g. ? 1 serving milk x 15 g = 15 g. ? 1 serving strawberries x 15 g = 15 g. 4. Add together all of the amounts to find the total grams of carbohydrates eaten: ? 30 g + 15 g + 15 g + 15 g = 75 g of carbohydrates total. Summary  Carbohydrate counting is a method of keeping track of how many carbohydrates you eat.  Eating carbohydrates naturally increases the amount of sugar (glucose) in the blood.  Counting how many carbohydrates you eat helps keep your blood glucose within normal limits, which helps you manage your diabetes.  A diet and nutrition specialist (registered dietitian) can help you make a meal plan and calculate how many carbohydrates you should have at each meal and snack. This information is not intended to replace advice given to you by your health care provider. Make sure you discuss any questions you have with your health care  provider. Document Released: 11/16/2005 Document Revised: 06/10/2017 Document Reviewed: 04/29/2016 Elsevier Patient Education  2020 [employer].

## 2019-06-26 NOTE — Progress Notes (Signed)
Established Patient Office Visit  Subjective:  Patient ID: Felicia Hanson, female    DOB: 17-Nov-1956  Age: 63 y.o. MRN: [id number]  CC:  Chief Complaint  Patient presents with  . med refills    glucotrol xl, synthroid, lopressor, pravachol and januvia  . urine milky and odor    x1 month.  . Depression    HPI Felicia Hanson presents for   Acute UTI She has a urine that has been milk with foul odor for a month Her sister also has the same issue They are both diabetic She denies fevers, chills or flank pain She is on Januvia for her diabetes  Severe Anxiety  She reports that she has been depressed  She states that she would never kill herself because she would not want o not get a chance to experience the afterlife with Jesus She has been nervous and fidgity. She states that she has just felt really sad and sees her Psychiatrist twice a year but feels like she needs to vent. She would like a referral for a therapist today.  At one time her psychiatrist had her on wellbutrin and cymbalta.  GAD 7 : Generalized Anxiety Score 06/26/2019 02/03/2019  Nervous, Anxious, on Edge 2 1  Control/stop worrying 2 2  Worry too much - different things 3 2  Trouble relaxing 3 3  Restless 2 2  Easily annoyed or irritable 2 0  Afraid - awful might happen 3 1  Total GAD 7 Score 17 11  Anxiety Difficulty Very difficult Very difficult     Depression screen [facility] 2/9 06/26/2019 06/26/2019 06/26/2019 04/06/2019 02/03/2019  Decreased Interest - - '3 2 2  '$ Down, Depressed, Hopeless - - '3 2 2  '$ PHQ - 2 Score - - '6 4 4  '$ Altered sleeping '3 3 3 3 2  '$ Tired, decreased energy '3 3 3 3 2  '$ Change in appetite '3 3 1 2 '$ 0  Feeling bad or failure about yourself  3 0 3 3 -  Trouble concentrating 0 0 0 0 2  Moving slowly or fidgety/restless '3 3 2 '$ 0 0  Suicidal thoughts 3 0 0 0 0  PHQ-9 Score - - '18 15 10  '$ Difficult doing work/chores Somewhat difficult Somewhat difficult Somewhat difficult Extremely dIfficult -  Some  recent data might be hidden   Abdominal hernia Incisional hernia Patient reports that she had an abdominal surgery 12  Years ago due to diverticulosis She now has occasional pain and a widening hernia She reports that it is intermittent and is not associated with nausea or vomiting or BRBPR  Uncontrolled diabetes  She has a history of diabetes that was is uncontrolled She states that she could not swallow the metformin pill She knows she will need additional medication.  The only medicatons she is on are glipizide and [place] She reports that with the [place] her vision seems blurry She has never seen Endocrinology Component     Latest Ref Rng & Units 02/03/2019 06/26/2019  Hemoglobin A1C     4.0 - 5.6 % 8.9 (H) 14.0 (A)  Est. average glucose Bld gHb Est-mCnc     mg/dL 209     Hypothyroidism: Patient presents for evaluation of thyroid function. Symptoms consist of fatigue, weight gain, anxiousness. Symptoms have present for 4 months. The symptoms are severe.  The problem has been gradually worsening.  Previous thyroid studies include TSH. The hypothyroidism is due to hypothyroidism.  Lab Results  Component Value Date  TSH 1.180 02/03/2019    Past Medical History:  Diagnosis Date  . Anxiety   . Depression   . Diabetes mellitus without complication ([place])   . Hyperlipidemia   . Hypertension   . Thyroid disease     Past Surgical History:  Procedure Laterality Date  . BREAST EXCISIONAL BIOPSY Left   . CHOLECYSTECTOMY    . COLON SURGERY     diverticulitis with perforation; s/p colon resection.  . Colonoscopy    . TUBAL LIGATION      Family History  Problem Relation Age of Onset  . Cancer Mother        unknown primary  . Hyperlipidemia Mother   . Breast cancer Paternal Aunt     Social History   Socioeconomic History  . Marital status: Married    Spouse name: Not on file  . Number of children: Not on file  . Years of education: Not on file  . Highest education  level: Not on file  Occupational History  . Not on file  Social Needs  . Financial resource strain: Not on file  . Food insecurity    Worry: Not on file    Inability: Not on file  . Transportation needs    Medical: Not on file    Non-medical: Not on file  Tobacco Use  . Smoking status: Current Every Day Smoker    Packs/day: 0.75    Years: 38.00    Pack years: 28.50    Types: Cigarettes  . Smokeless tobacco: Never Used  Substance and Sexual Activity  . Alcohol use: No    Alcohol/week: 0.0 standard drinks  . Drug use: No  . Sexual activity: Yes  Lifestyle  . Physical activity    Days per week: Not on file    Minutes per session: Not on file  . Stress: Not on file  Relationships  . Social Herbalist on phone: Not on file    Gets together: Not on file    Attends religious service: Not on file    Active member of club or organization: Not on file    Attends meetings of clubs or organizations: Not on file    Relationship status: Not on file  . Intimate partner violence    Fear of current or ex partner: Not on file    Emotionally abused: Not on file    Physically abused: Not on file    Forced sexual activity: Not on file  Other Topics Concern  . 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 ACCU-CHEK AVIVA PLUS test strip daily. for testing  2  . ALPRAZolam (XANAX) 1 MG tablet Take 0.5 mg by mouth 3 (three) times daily as needed for anxiety or sleep.     Marland Kitchen amphetamine-dextroamphetamine (ADDERALL) 20 MG tablet Reported on 11/26/2015  0  . aspirin 81 MG tablet Take 81 mg by mouth daily.    . DULoxetine (CYMBALTA) 60 MG capsule Take 2 capsules (120 mg total) by mouth daily. 180 capsule 1  . glipiZIDE  (GLUCOTROL XL) 10 MG 24 hr tablet TAKE  1 TABLET (10 MG TOTAL) BY MOUTH DAILY WITH BREAKFAST. 60 tablet 0  . JANUVIA 50 MG tablet TAKE 1 TABLET BY MOUTH EVERY DAY 60 tablet 0  . levothyroxine (SYNTHROID) 88 MCG tablet TAKE 1 TABLET (88 MCG TOTAL) BY MOUTH DAILY BEFORE BREAKFAST. 90 tablet 0  . metoprolol tartrate (LOPRESSOR) 50 MG tablet TAKE 1 TABLET BY MOUTH TWICE A DAY 180 tablet 0  . pravastatin (PRAVACHOL) 40 MG tablet TAKE 1 TABLET BY MOUTH EVERY DAY 90 tablet 2   No facility-administered medications prior to visit.     Allergies  Allergen Reactions  . Metformin And Related Other (See Comments)    Could not swallow    ROS Review of Systems Review of Systems  Constitutional: Negative for activity change, appetite change, chills and fever.  HENT: Negative for congestion, nosebleeds, trouble swallowing and voice change.  +blurry vision Respiratory: Negative for cough, shortness of breath and wheezing.   Gastrointestinal: Negative for diarrhea, nausea and vomiting. +loose stools Genitourinary: Negative for difficulty urinating, dysuria, flank pain and hematuria.  Musculoskeletal: Negative for back pain, joint swelling and neck pain.  Neurological: Negative for dizziness, speech difficulty, light-headedness and numbness.  See HPI. All other review of systems negative.     Objective:    Physical Exam  BP (!) 160/88 (BP Location: Left Arm, Patient Position: Sitting, Cuff Size: Normal)   Pulse 97   Temp 98.7 F (37.1 C) (Oral)   Resp 17   Ht '5\' 1"'$  (1.549 m)   Wt 169 lb 9.6 oz (76.9 kg)   SpO2 95%   BMI 32.05 kg/m  Wt Readings from Last 3 Encounters:  06/26/19 169 lb 9.6 oz (76.9 kg)  02/03/19 177 lb (80.3 kg)  08/11/18 172 lb 9.6 oz (78.3 kg)   Physical Exam  Constitutional: Oriented to person, place, and time. Appears well-developed and well-nourished.  HENT:  Head: Normocephalic and atraumatic.  Eyes: Conjunctivae and EOM are normal.  Cardiovascular: Normal rate,  regular rhythm, normal heart sounds and intact distal pulses.  No murmur heard. Pulmonary/Chest: Effort normal and breath sounds normal. No stridor. No respiratory distress. Has no wheezes.  Abdomen: obese, normoactive bs, soft, midline incisional hernia noted about 8cm l x 5cm w, no palpable masses, no rebound, no guarding Neurological: Is alert and oriented to person, place, and time.  Skin: Skin is warm. Capillary refill takes less than 2 seconds.  Psychiatric: Has a normal mood and affect. Behavior is normal. Judgment and thought content normal.    Health Maintenance Due  Topic Date Due  . PAP SMEAR-Modifier  12/12/1975  . COLONOSCOPY  12/11/2004  . OPHTHALMOLOGY EXAM  10/30/2018    There are no preventive care reminders to display for this patient.  Lab Results  Component Value Date   TSH 1.180 02/03/2019   Lab Results  Component Value Date   WBC 10.3 02/03/2019   HGB 13.6 02/03/2019   HCT 39.9 02/03/2019   MCV 85 02/03/2019   PLT 217 02/03/2019   Lab Results  Component Value Date   NA 137 02/03/2019   K 4.5 02/03/2019   CO2 23 02/03/2019   GLUCOSE 309 (H) 02/03/2019   BUN 8 02/03/2019   CREATININE 0.77 02/03/2019   BILITOT 0.3 02/03/2019   ALKPHOS 106 02/03/2019   AST 30 02/03/2019   ALT 16 02/03/2019   PROT 7.2 02/03/2019   ALBUMIN 3.8 02/03/2019   CALCIUM 9.1 02/03/2019   ANIONGAP 10 09/13/2016   Lab Results  Component  Value Date   CHOL 150 02/03/2019   Lab Results  Component Value Date   HDL 40 02/03/2019   Lab Results  Component Value Date   LDLCALC 64 02/03/2019   Lab Results  Component Value Date   TRIG 231 (H) 02/03/2019   Lab Results  Component Value Date   CHOLHDL 3.8 02/03/2019   Lab Results  Component Value Date   HGBA1C 14.0 (A) 06/26/2019      Assessment & Plan:   Problem List Items Addressed This Visit      Endocrine   Hypothyroidism  - due to concern for fatigue and anxiety will recheck tsh   Relevant Orders   TSH    Type 2 diabetes mellitus without complication, without long-term current use of insulin (HCC) - Primary   Relevant Medications   Dulaglutide (TRULICITY) 2.11 HE/1.7EY SOPN   lisinopril (ZESTRIL) 2.5 MG tablet     Other   Generalized anxiety disorder   Relevant Orders   TSH   Ambulatory referral to [place]    Other Visit Diagnoses    Bad odor of urine       Relevant Orders   POCT urinalysis dipstick (Completed)   Acute UTI    - discussed bactrim empirically while awaiting urine culture   Relevant Medications   sulfamethoxazole-trimethoprim (BACTRIM DS) 800-160 MG tablet   Other Relevant Orders   Urine Culture   Uncontrolled type 2 diabetes mellitus with hyperglycemia ([place])    -  Will refer to Endocrinology Will add low dose of lisinopril to protect the kidneys   Relevant Medications   Dulaglutide (TRULICITY) 8.14 GY/1.8HU SOPN   lisinopril (ZESTRIL) 2.5 MG tablet   Other Relevant Orders   POCT glucose (manual entry) (Completed)   POCT glycosylated hemoglobin (Hb A1C) (Completed)   Lipid panel   CMP14+EGFR   Ambulatory referral to Endocrinology   Severe anxiety    -  Will refer to [facility] She was advised to call Psychiatry   Relevant Orders   TSH   Ambulatory referral to [place]    Incisional hernia -  Pt given ER precautions, would advise general surgery once her diabetes is more stable  Meds ordered this encounter  Medications  . sulfamethoxazole-trimethoprim (BACTRIM DS) 800-160 MG tablet    Sig: Take 1 tablet by mouth 2 (two) times daily.    Dispense:  10 tablet    Refill:  0  . Dulaglutide (TRULICITY) 3.14 HF/0.2OV SOPN    Sig: Inject 0.75 mg into the skin once a week.    Dispense:  4 pen    Refill:  1  . lisinopril (ZESTRIL) 2.5 MG tablet    Sig: Take 1 tablet (2.5 mg total) by mouth daily.    Dispense:  90 tablet    Refill:  1    Follow-up: Return for with Endocrinology.   A total of 45 minutes were spent face-to-face with the patient  during this encounter and over half of that time was spent on counseling and coordination of care. Spent time teaching how to give trulicity and discussed MOA of the medication Forrest Moron, MD

## 2019-06-27 ENCOUNTER — Telehealth: Payer: Self-pay | Admitting: Family Medicine

## 2019-06-27 NOTE — Telephone Encounter (Signed)
Copied from [place] [id number]. Topic: General - Other >> Jun 26, 2019  5:03 PM Mcneil, Ja-Kwan wrote: Reason for CRM: Pt stated she can not swallow the pills she was prescribed for the uti. Pt stated she spoke with the pharmacy and she was told it does come in a liquid form but they would need a new Rx for the liquid form. Pt request new Rx for uti medication in the liquid form.

## 2019-06-27 NOTE — Telephone Encounter (Signed)
1. Pt needs new Rx for sulfamethoxazole-trimethoprim (BACTRIM DS) 800-160 MG tablet Sent in as a liquid. The pill is too large for her to swallow, even when she cuts in half.  2.  What time should she take the new Rx dr Nolon Rod prescribed yesterday: lisinopril (ZESTRIL) 2.5 MG tablet Am or pm? Pt does not even remember a new med was added.  3. Should she continue to take her metoprolol along with the lisinopril? the pharmacy advised pt it was for bp also.  Please send liquid bactrim to   [id number] [address], [place] [phone] (Phone) [phone] (Fax)

## 2019-06-28 ENCOUNTER — Telehealth: Payer: Self-pay | Admitting: Family Medicine

## 2019-06-28 DIAGNOSIS — E119 Type 2 diabetes mellitus without complications: Secondary | ICD-10-CM

## 2019-06-28 LAB — URINE CULTURE

## 2019-06-28 MED ORDER — SITAGLIPTIN PHOSPHATE 50 MG PO TABS: 50.0000 mg | ORAL_TABLET | Freq: Every day | ORAL | 0 refills | Status: DC

## 2019-06-28 NOTE — Telephone Encounter (Signed)
Please advise on message below for pt.

## 2019-06-28 NOTE — Telephone Encounter (Signed)
Bactrim is only in a pill form  Her results were also sent to the lab pool about the bactrim  She should take metoprolol and lisinopril

## 2019-06-28 NOTE — Telephone Encounter (Signed)
I have answered all her questions and she says that she is running a low grade fever of 99.6 and id requesting different antibiotic for she is unable to larger pills. She would like an liquid version if it is possible. She did not take any of the Bactrim.  I did relay the other information and she stated understanding.

## 2019-06-28 NOTE — Telephone Encounter (Signed)
Please advise on pt questions and concerns.   1. Can the Bactrim be sent to pharmacy in a liquid form?  2. Is she suppose to take the metoprolol along with lisinopril?   Once I get the message from the provider I will let pt know her response.  Pt is able to take lisinopril am or pm as long as she takes it daily.

## 2019-06-28 NOTE — Telephone Encounter (Signed)
UTI Medication  If she could get Liquid / and her blood sugar medications  Has fever 99.6   [facility] [place] [address]   Please call at this number [phone]

## 2019-06-28 NOTE — Telephone Encounter (Signed)
I have send a message to provider regarding this in another encounter.

## 2019-06-28 NOTE — Telephone Encounter (Signed)
Medication Refill - Medication: JANUVIA 50 MG tablet   Pt also would like to know if it is ok if she takes her lisinopril (ZESTRIL) 2.5 MG tablet At night or in the morning with her other blood pressure medication/ please advise   Has the patient contacted their pharmacy? Yes.   (Agent: If no, request that the patient contact the pharmacy for the refill.) (Agent: If yes, when and what did the pharmacy advise?)  Preferred Pharmacy (with phone number or street name):  [id number] [address], [place] [phone] (Phone) [phone] (Fax)     Agent: Please be advised that RX refills may take up to 3 business days. We ask that you follow-up with your pharmacy.

## 2019-06-28 NOTE — Telephone Encounter (Signed)
Already sent this information/request in a phone note yesterday.

## 2019-06-28 NOTE — Telephone Encounter (Signed)
This request has been sent. This is a duplicate.

## 2019-06-29 ENCOUNTER — Telehealth: Payer: Self-pay | Admitting: Family Medicine

## 2019-06-29 LAB — CMP14+EGFR
ALT: 10 IU/L (ref 0–32)
AST: 20 IU/L (ref 0–40)
Albumin/Globulin Ratio: 1.1 — ABNORMAL LOW (ref 1.2–2.2)
Albumin: 3.9 g/dL (ref 3.8–4.8)
Alkaline Phosphatase: 98 IU/L (ref 39–117)
BUN/Creatinine Ratio: 10 — ABNORMAL LOW (ref 12–28)
BUN: 7 mg/dL — ABNORMAL LOW (ref 8–27)
Bilirubin Total: 0.5 mg/dL (ref 0.0–1.2)
CO2: 18 mmol/L — ABNORMAL LOW (ref 20–29)
Calcium: 9.3 mg/dL (ref 8.7–10.3)
Chloride: 96 mmol/L (ref 96–106)
Creatinine, Ser: 0.72 mg/dL (ref 0.57–1.00)
GFR calc Af Amer: 102 mL/min/{1.73_m2} (ref >59)
GFR calc non Af Amer: 89 mL/min/{1.73_m2} (ref >59)
Globulin, Total: 3.4 g/dL (ref 1.5–4.5)
Glucose: 269 mg/dL — ABNORMAL HIGH (ref 65–99)
Potassium: 3.6 mmol/L (ref 3.5–5.2)
Sodium: 135 mmol/L (ref 134–144)
Total Protein: 7.3 g/dL (ref 6.0–8.5)

## 2019-06-29 LAB — LIPID PANEL
Chol/HDL Ratio: 3.5 ratio (ref 0.0–4.4)
Cholesterol, Total: 168 mg/dL (ref 100–199)
HDL: 48 mg/dL (ref >39)
LDL Calculated: 88 mg/dL (ref 0–99)
Triglycerides: 161 mg/dL — ABNORMAL HIGH (ref 0–149)
VLDL Cholesterol Cal: 32 mg/dL (ref 5–40)

## 2019-06-29 LAB — TSH

## 2019-06-29 MED ORDER — CIPROFLOXACIN 500 MG/5ML (10%) PO SUSR: 500.0000 mg | Freq: Two times a day (BID) | ORAL | 0 refills | Status: AC

## 2019-06-29 NOTE — Addendum Note (Signed)
Addended by: Delia Chimes A on: 06/29/2019 04:32 PM   Modules accepted: Orders

## 2019-06-29 NOTE — Telephone Encounter (Signed)
She reports that she has been having headache and it Is getting worse and requiring 2 alleves a day  She reports that she was having a headache with her last UTI She is having a low grade temperature that started the night before coming in to the office.   She cannot swallow the antibiotic so she has not been able to take bactrim  Will use Cipro 500mg  susp bid  Instructed pt to hold glipizide due to severe hypoglycemia She verbalizes understanding

## 2019-06-29 NOTE — Telephone Encounter (Signed)
Please advise on the medication change for her antibiotics

## 2019-06-29 NOTE — Telephone Encounter (Signed)
Made appt with Sagardia to talk about the symptoms pt is having. She says she may go the pharmacy to have testing done if appts are available if symptoms worsen before Monday

## 2019-06-29 NOTE — Telephone Encounter (Signed)
Pt calling again.  States that she can't take the antibiotic pill and needs to know what to do.  Pt states she expected a call yesterday and still hasn't heard about this issue.

## 2019-06-29 NOTE — Telephone Encounter (Signed)
Copied from [place] [id number]. Topic: General - Other >> Jun 29, 2019  3:15 PM Leward Quan A wrote: Reason for CRM: Patient called to say that she is still running a low grade fever and have to take 2 Aleves a day because her headaches are that bad. She is going on 3 days and want to know when she can get something ASAP Please call Ph# [phone]

## 2019-06-30 ENCOUNTER — Ambulatory Visit: Payer: 59 | Admitting: Internal Medicine

## 2019-06-30 DIAGNOSIS — Z0289 Encounter for other administrative examinations: Secondary | ICD-10-CM

## 2019-06-30 NOTE — Progress Notes (Deleted)
Name: Felicia Hanson  MRN/ DOB: [id number], 05-Apr-1956   Age/ Sex: 63 y.o., female    PCP: Doristine Bosworth, MD   Reason for Endocrinology Evaluation: Type {NUMBERS 1 OR 2:522190} Diabetes Mellitus     Date of Initial Endocrinology Visit: 06/30/2019     PATIENT IDENTIFIER: Felicia Hanson is a 63 y.o. female with a past medical history of ***. The patient presented for initial endocrinology clinic visit on 06/30/2019 for consultative assistance with her diabetes management.    HPI: Ms. Seekings was    Diagnosed with DM *** Prior Medications tried/Intolerance: *** Currently checking blood sugars *** x / day,  before breakfast and ***.  Hypoglycemia episodes : ***               Symptoms: ***                 Frequency: ***/  Hemoglobin A1c has ranged from  in ***, peaking at *** in ***. Patient required assistance for hypoglycemia:  Patient has required hospitalization within the last 1 year from hyper or hypoglycemia:   In terms of diet, the patient ***   HOME DIABETES REGIMEN: Trulicity 0.75 mg weekly  Glipizide 10 mg daily  Januvia 50 mg daily    Statin: {Yes/No:11203} ACE-I/ARB: {YES/NO:17245} Prior Diabetic Education: {Yes/No:11203}   METER DOWNLOAD SUMMARY: Date range evaluated: *** Fingerstick Blood Glucose Tests = *** Average Number Tests/Day = *** Overall Mean FS Glucose = *** Standard Deviation = ***  BG Ranges: Low = *** High = ***   Hypoglycemic Events/30 Days: BG < 50 = *** Episodes of symptomatic severe hypoglycemia = ***   DIABETIC COMPLICATIONS: Microvascular complications:   ***  Denies: ***  Last eye exam: Completed   Macrovascular complications:   ***  Denies: CAD, PVD, CVA   PAST HISTORY: Past Medical History:  Past Medical History:  Diagnosis Date  . Anxiety   . Depression   . Diabetes mellitus without complication (HCC)   . Hyperlipidemia   . Hypertension   . Thyroid disease     Past Surgical History:  Past  Surgical History:  Procedure Laterality Date  . BREAST EXCISIONAL BIOPSY Left   . CHOLECYSTECTOMY    . COLON SURGERY     diverticulitis with perforation; s/p colon resection.  . Colonoscopy    . TUBAL LIGATION        Social History:  reports that she has been smoking cigarettes. She has a 28.50 pack-year smoking history. She has never used smokeless tobacco. She reports that she does not drink alcohol or use drugs. Family History:  Family History  Problem Relation Age of Onset  . Cancer Mother        unknown primary  . Hyperlipidemia Mother   . Breast cancer Paternal Aunt       HOME MEDICATIONS: Allergies as of 06/30/2019      Reactions   Metformin And Related Other (See Comments)   Could not swallow      Medication List       Accurate as of June 30, 2019 12:59 PM. If you have any questions, ask your nurse or doctor.        Accu-Chek Aviva Plus test strip Generic drug: glucose blood daily. for testing   ALPRAZolam 1 MG tablet Commonly known as: XANAX Take 0.5 mg by mouth 3 (three) times daily as needed for anxiety or sleep.   amphetamine-dextroamphetamine 20 MG tablet Commonly known as: ADDERALL Reported  on 11/26/2015   aspirin 81 MG tablet Take 81 mg by mouth daily.   ciprofloxacin 500 MG/5ML (10%) suspension Commonly known as: CIPRO Take 5 mLs (500 mg total) by mouth 2 (two) times daily for 3 days.   DULoxetine 60 MG capsule Commonly known as: CYMBALTA Take 2 capsules (120 mg total) by mouth daily.   glipiZIDE 10 MG 24 hr tablet Commonly known as: GLUCOTROL XL TAKE 1 TABLET (10 MG TOTAL) BY MOUTH DAILY WITH BREAKFAST.   levothyroxine 88 MCG tablet Commonly known as: SYNTHROID TAKE 1 TABLET (88 MCG TOTAL) BY MOUTH DAILY BEFORE BREAKFAST.   lisinopril 2.5 MG tablet Commonly known as: ZESTRIL Take 1 tablet (2.5 mg total) by mouth daily.   metoprolol tartrate 50 MG tablet Commonly known as: LOPRESSOR TAKE 1 TABLET BY MOUTH TWICE A DAY    pravastatin 40 MG tablet Commonly known as: PRAVACHOL TAKE 1 TABLET BY MOUTH EVERY DAY   sitaGLIPtin 50 MG tablet Commonly known as: Januvia Take 1 tablet (50 mg total) by mouth daily.   Trulicity 3.23 FT/7.3UK Sopn Generic drug: Dulaglutide Inject 0.75 mg into the skin once a week.        ALLERGIES: Allergies  Allergen Reactions  . Metformin And Related Other (See Comments)    Could not swallow     REVIEW OF SYSTEMS: A comprehensive ROS was conducted with the patient and is negative except as per HPI and below:  ROS    OBJECTIVE:   VITAL SIGNS: There were no vitals taken for this visit.   PHYSICAL EXAM:  General: Pt appears well and is in NAD  Hydration: Well-hydrated with moist mucous membranes and good skin turgor  HEENT: Head: Unremarkable with good dentition. Oropharynx clear without exudate.  Eyes: External eye exam normal without stare, lid lag or exophthalmos.  EOM intact.  PERRL.  Neck: General: Supple without adenopathy or carotid bruits. Thyroid: Thyroid size normal.  No goiter or nodules appreciated. No thyroid bruit.  Lungs: Clear with good BS bilat with no rales, rhonchi, or wheezes  Heart: RRR with normal S1 and S2 and no gallops; no murmurs; no rub  Abdomen: Normoactive bowel sounds, soft, nontender, without masses or organomegaly palpable  Extremities:  Lower extremities - No pretibial edema. No lesions.  Skin: Normal texture and temperature to palpation. No rash noted. No Acanthosis nigricans/skin tags. No lipohypertrophy.  Neuro: MS is good with appropriate affect, pt is alert and Ox3    DM foot exam:    DATA REVIEWED:  Lab Results  Component Value Date   HGBA1C 14.0 (A) 06/26/2019   HGBA1C 8.9 (H) 02/03/2019   HGBA1C 8.8 (A) 02/03/2019   Lab Results  Component Value Date   LDLCALC 88 06/26/2019   CREATININE 0.72 06/26/2019   No results found for: [facility]  Lab Results  Component Value Date   CHOL 168 06/26/2019   HDL 48  06/26/2019   LDLCALC 88 06/26/2019   TRIG 161 (H) 06/26/2019   CHOLHDL 3.5 06/26/2019        ASSESSMENT / PLAN / RECOMMENDATIONS:   1) Type *** Diabetes Mellitus, ***controlled, With*** complications - Most recent A1c of *** %. Goal A1c < *** %.  ***  Plan: GENERAL:  ***  MEDICATIONS:  ***  EDUCATION / INSTRUCTIONS:  BG monitoring instructions: Patient is instructed to check her blood sugars *** times a day, ***.  Call [facility] if: BG persistently < 70 or > 300. . I reviewed the Rule of  15 for the treatment of hypoglycemia in detail with the patient. Literature supplied.   2) Diabetic complications:   Eye: Does *** have known diabetic retinopathy.   Neuro/ Feet: Does *** have known diabetic peripheral neuropathy.  Renal: Patient does *** have known baseline CKD. She is *** on an ACEI/ARB at present.Check urine albumin/creatinine ratio yearly starting at time of diagnosis. If albuminuria is positive, treatment is geared toward better glucose, blood pressure control and use of ACE inhibitors or ARBs. Monitor electrolytes and creatinine once to twice yearly.   3) Lipids: Patient is *** on a statin.    4) Hypertension: ***  at goal of < 140/90 mmHg.       Signed electronically by: Lyndle HerrlichAbby Jaralla Talar Fraley, MD  [facility]  [facility] [address] [place], [place] [address] Phone: [phone] FAX: [phone]   CC: Doristine BosworthStallings, Zoe A, MD [address] [facility] [place] [address] Phone: [phone]  Fax: [phone]    Return to Endocrinology clinic as below: Future Appointments  Date Time Provider Department Center  06/30/2019  2:40 PM Ger Nicks, Konrad DoloresIbtehal Jaralla, MD [facility] None  07/03/2019  5:20 PM Georgina QuintSagardia, Miguel Jose, MD [facility]

## 2019-07-03 ENCOUNTER — Telehealth: Payer: Self-pay | Admitting: Emergency Medicine

## 2019-07-03 ENCOUNTER — Telehealth (INDEPENDENT_AMBULATORY_CARE_PROVIDER_SITE_OTHER): Payer: 59 | Admitting: Emergency Medicine

## 2019-07-03 ENCOUNTER — Other Ambulatory Visit: Payer: Self-pay

## 2019-07-03 ENCOUNTER — Encounter: Payer: Self-pay | Admitting: Emergency Medicine

## 2019-07-03 NOTE — Telephone Encounter (Signed)
Spoke to husband.  Patient not available.

## 2019-07-03 NOTE — Telephone Encounter (Signed)
No answer

## 2019-07-03 NOTE — Progress Notes (Signed)
Having some symptoms and would like to be tested for covid. She is having headaches, having some diarrhea and stuffy ears with fever of 99.6 off and on.. Having some hoarseness. Taking aleve for the headaches. Pt was just recently given mediation for uti, the oral medication she was not able to swallow. Given the liquid form, waiting for that medication to come in. Not sure if what she is feeling now has anything to do with not treating the uti

## 2019-07-04 ENCOUNTER — Encounter: Payer: Self-pay | Admitting: Emergency Medicine

## 2019-07-14 ENCOUNTER — Telehealth: Payer: Self-pay | Admitting: Family Medicine

## 2019-07-14 DIAGNOSIS — R110 Nausea: Secondary | ICD-10-CM

## 2019-07-14 MED ORDER — ONDANSETRON 4 MG PO TBDP: 4.0000 mg | ORAL_TABLET | Freq: Three times a day (TID) | ORAL | 1 refills | Status: DC | PRN

## 2019-07-14 MED ORDER — CIPRO 500 MG/5ML (10%) PO SUSR: 250.0000 mg | Freq: Two times a day (BID) | ORAL | 0 refills | Status: AC

## 2019-07-14 NOTE — Telephone Encounter (Signed)
Pt states that her pharmacy does not have the liquid medication that Dr. Nolon Rod prescribed for her UTI. The pills that were prescribed were too big and she couldn't swallow it. Pt would like a call back once this has been resolved on her home phone.   Pt states she started her Trulicity shots on Wednesday and she said her blood sugar went from over 500 to 260

## 2019-07-14 NOTE — Telephone Encounter (Signed)
Spoke with pt advised suspension Cipro 500 mg/5 ml (10%) and zofran 4 mg sent over to pharmacy.  Pt agreeable. Dgaddy, CMA

## 2019-07-18 ENCOUNTER — Other Ambulatory Visit: Payer: Self-pay | Admitting: Family Medicine

## 2019-07-24 ENCOUNTER — Ambulatory Visit: Payer: Self-pay | Admitting: *Deleted

## 2019-07-24 NOTE — Telephone Encounter (Signed)
Contacted pt per her request to discuss Cipro; the pt says that this medication was called in for her  UTI, but [facility] and she has questions; she was concerned that the benefit may outweigh the risk due to side effects; she would would like to discuss this with Dr Bridget Hartshorn; the pt can be contacted at [phone].  Reason for Disposition . [1] Caller has URGENT medication question about med that PCP or specialist prescribed AND [2] triager unable to answer question  Answer Assessment - Initial Assessment Questions 1.   NAME of MEDICATION: "What medicine are you calling about?"     Cipro 2.   QUESTION: "What is your question?"   Do the benefits of taking this medication outweight the risks 3.   PRESCRIBING HCP: "Who prescribed it?" Reason: if prescribed by specialist, call should be referred to that group.   Dr Delia Chimes 4. SYMPTOMS: "Do you have any symptoms?"     no 5. SEVERITY: If symptoms are present, ask "Are they mild, moderate or severe?"     n/a 6.  PREGNANCY:  "Is there any chance that you are pregnant?" "When was your last menstrual period?"   no  Protocols used: MEDICATION QUESTION CALL-A-AH

## 2019-07-25 NOTE — Telephone Encounter (Signed)
Please advise, this pt has question that I cant answer.

## 2019-07-28 ENCOUNTER — Ambulatory Visit: Payer: Self-pay

## 2019-07-28 ENCOUNTER — Telehealth: Payer: Self-pay | Admitting: Family Medicine

## 2019-07-28 NOTE — Telephone Encounter (Signed)
Incoming call from Patient who states that about a month ago, Patient was seen with Dr. With Dr. Nolon Rod. Dx with a bad UTI, Rx a medication  Called Ciprofloxacin. 250mg  suspension.  Medication hand out reads if Patient is taking Duloxetine. Consult  MD.Patient is concerned that she has Severe anxiety and Medication warns not to take before talking with Dr.  Patient states she was to talk with Dr, Mitchel Honour  About the medication never received a call. Stayed by the phone waited for 1.5 hrs, had to leave  Received a letter stating she missed appt. Patient states that she is overwhelmed.  .  Patient states she feels a little better. Pushing fluids.  States she probably still has UTI but Sx are deceasing.  Pt states that she will not take the medication Until she speaks with Dr. About the medication. Request a return call please.

## 2019-07-28 NOTE — Telephone Encounter (Signed)
Copied from [place] [id number]. Topic: General - Other >> Jul 28, 2019  1:48 PM Sheran Luz wrote: See NT encounter from 8/24. Unable to addend. Patient requesting call back today. She has never received answer to her question.

## 2019-08-01 ENCOUNTER — Other Ambulatory Visit: Payer: Self-pay

## 2019-08-01 DIAGNOSIS — N390 Urinary tract infection, site not specified: Secondary | ICD-10-CM

## 2019-08-01 MED ORDER — CEFTRIAXONE SODIUM 1 G IJ SOLR: 1.0000 g | Freq: Once | INTRAMUSCULAR | Status: DC

## 2019-08-01 MED ORDER — AMOXICILLIN-POT CLAVULANATE 250-62.5 MG/5ML PO SUSR: 500.0000 mg | Freq: Two times a day (BID) | ORAL | 0 refills | Status: AC

## 2019-08-01 NOTE — Progress Notes (Signed)
Pt called.  Coming to clinic 09/03 for Rocephin 1gm IM.

## 2019-08-01 NOTE — Addendum Note (Signed)
Addended by: Delia Chimes A on: 08/01/2019 04:35 PM   Modules accepted: Orders

## 2019-08-01 NOTE — Telephone Encounter (Signed)
Will ask Nurse Manager Caro Hight to arrange nurse visit for patient to get Rocephin since she cannot swallow pills and her Klebsiella Pneumonia on the urine culture. Since she has been diagnosed since 06/26/2019 she should come in for a nurse visit to get Rocephin 1 gram  She verbalizes understanding  She cannot swallow pills and did not take the cipro abx as a suspension because of concern about drug interaction  Sent in the Augmentin for patient to take after she gets her Rocephin

## 2019-08-02 NOTE — Telephone Encounter (Signed)
Pt has appointment for 9/3 for injection.

## 2019-08-02 NOTE — Telephone Encounter (Signed)
Pt called by  Caro Hight and coming in on 08/03/2019 for rocephin shot. Dgaddy, CMA

## 2019-08-05 ENCOUNTER — Other Ambulatory Visit: Payer: Self-pay | Admitting: Family Medicine

## 2019-08-17 ENCOUNTER — Other Ambulatory Visit: Payer: Self-pay | Admitting: Family Medicine

## 2019-08-17 DIAGNOSIS — E1165 Type 2 diabetes mellitus with hyperglycemia: Secondary | ICD-10-CM

## 2019-08-17 NOTE — Telephone Encounter (Signed)
Forwarding medication refill request to the clinical pool for review. 

## 2019-08-25 ENCOUNTER — Ambulatory Visit: Payer: Self-pay | Admitting: *Deleted

## 2019-08-25 ENCOUNTER — Telehealth (INDEPENDENT_AMBULATORY_CARE_PROVIDER_SITE_OTHER): Payer: 59 | Admitting: Adult Health Nurse Practitioner

## 2019-08-25 ENCOUNTER — Encounter: Payer: Self-pay | Admitting: Adult Health Nurse Practitioner

## 2019-08-25 ENCOUNTER — Telehealth: Payer: Self-pay | Admitting: Family Medicine

## 2019-08-25 VITALS — Temp 100.0°F | Ht 60.0 in | Wt 160.0 lb

## 2019-08-25 DIAGNOSIS — J069 Acute upper respiratory infection, unspecified: Secondary | ICD-10-CM | POA: Diagnosis not present

## 2019-08-25 MED ORDER — BEVESPI AEROSPHERE 9-4.8 MCG/ACT IN AERO: 1.0000 | INHALATION_SPRAY | Freq: Two times a day (BID) | RESPIRATORY_TRACT | 1 refills | Status: AC

## 2019-08-25 MED ORDER — AZITHROMYCIN 250 MG PO TABS: 250.0000 mg | ORAL_TABLET | Freq: Every day | ORAL | 0 refills | Status: AC

## 2019-08-25 NOTE — Patient Instructions (Signed)
° ° ° °  If you have lab work done today you will be contacted with your lab results within the next 2 weeks.  If you have not heard from us then please contact us. The fastest way to get your results is to register for My Chart. ° ° °IF you received an x-ray today, you will receive an invoice from [facility]. Please contact [facility] at [phone] with questions or concerns regarding your invoice.  ° °IF you received labwork today, you will receive an invoice from [facility]. Please contact [facility] at [phone] with questions or concerns regarding your invoice.  ° °Our billing staff will not be able to assist you with questions regarding bills from these companies. ° °You will be contacted with the lab results as soon as they are available. The fastest way to get your results is to activate your My Chart account. Instructions are located on the last page of this paperwork. If you have not heard from us regarding the results in 2 weeks, please contact this office. °  ° ° ° °

## 2019-08-25 NOTE — Progress Notes (Signed)
Telemedicine Encounter- SOAP NOTE Established Patient  This telephone encounter was conducted with the patient's (or proxy's) verbal consent via audio telecommunications: yes/no: Yes Patient was instructed to have this encounter in a suitably private space; and to only have persons present to whom they give permission to participate. In addition, patient identity was confirmed by use of name plus two identifiers (DOB and address).  I discussed the limitations, risks, security and privacy concerns of performing an evaluation and management service by telephone and the availability of in person appointments. I also discussed with the patient that there may be a patient responsible charge related to this service. The patient expressed understanding and agreed to proceed.  I spent a total of TIME; 0 MIN TO 60 MIN: 25 minutes talking with the patient or their proxy.  No chief complaint on file.   Subjective   Felicia Hanson is a 63 y.o. established patient. Telephone visit today for acute URI  HPI   Patient is a pleasant 63 year old female.  She takes care of her grandchildren and has been around them with sneezing, coughing, runny nose.  Reports one of her grandchildren works at the [employer] where her boss was out for [employer].    She developed symptoms of fever, cough, and sore throat starting Wed. Night.  It has worsened since then.  She took some Mucinex yesterday with mild relief.  Reports productive cough.  Fever from 100-101 and fatigue.  As above, she has been at risk for exposure and is a 1ppd smoker with COPD.  Mild chest discomfort with coughing and sore throat continued today.    Patient Active Problem List   Diagnosis Date Noted  . Type 2 diabetes mellitus without complication, without long-term current use of insulin (HCC) 10/26/2017  . Generalized anxiety disorder 01/10/2014  . Vitamin D deficiency 01/10/2014  . Essential hypertension, benign 01/10/2014  . Pure  hypercholesterolemia 01/10/2014  . Hypothyroidism 01/10/2014    Past Medical History:  Diagnosis Date  . Anxiety   . Depression   . Diabetes mellitus without complication (HCC)   . Hyperlipidemia   . Hypertension   . Thyroid disease     Current Outpatient Medications  Medication Sig Dispense Refill  . ACCU-CHEK AVIVA PLUS test strip daily. for testing  2  . ALPRAZolam (XANAX) 1 MG tablet Take 0.5 mg by mouth 3 (three) times daily as needed for anxiety or sleep.     . DULoxetine (CYMBALTA) 60 MG capsule Take 2 capsules (120 mg total) by mouth daily. 180 capsule 1  . glipiZIDE (GLUCOTROL XL) 10 MG 24 hr tablet Take 1 tablet (10 mg total) by mouth daily with breakfast. 90 tablet 0  . levothyroxine (SYNTHROID) 88 MCG tablet TAKE 1 TABLET (88 MCG TOTAL) BY MOUTH DAILY BEFORE BREAKFAST. 90 tablet 0  . metoprolol tartrate (LOPRESSOR) 50 MG tablet TAKE 1 TABLET BY MOUTH TWICE A DAY 180 tablet 0  . pravastatin (PRAVACHOL) 40 MG tablet TAKE 1 TABLET BY MOUTH EVERY DAY 90 tablet 2  . sitaGLIPtin (JANUVIA) 50 MG tablet Take 1 tablet (50 mg total) by mouth daily. 90 tablet 0  . TRULICITY 0.75 MG/0.5ML SOPN INJECT 0.75 MG INTO THE SKIN ONCE A WEEK. 2 pen 1  . amphetamine-dextroamphetamine (ADDERALL) 20 MG tablet Reported on 11/26/2015  0  . aspirin 81 MG tablet Take 81 mg by mouth daily.    Marland Kitchen azithromycin (ZITHROMAX) 250 MG tablet Take 1 tablet (250 mg total) by  mouth daily for 6 days. 6 tablet 0  . Glycopyrrolate-Formoterol (BEVESPI AEROSPHERE) 9-4.8 MCG/ACT AERO Inhale 1 puff into the lungs 2 (two) times daily. 5.9 g 1  . lisinopril (ZESTRIL) 2.5 MG tablet Take 1 tablet (2.5 mg total) by mouth daily. (Patient not taking: Reported on 08/25/2019) 90 tablet 1  . ondansetron (ZOFRAN ODT) 4 MG disintegrating tablet Take 1 tablet (4 mg total) by mouth every 8 (eight) hours as needed for nausea or vomiting. (Patient not taking: Reported on 08/25/2019) 20 tablet 1   Current Facility-Administered  Medications  Medication Dose Route Frequency Provider Last Rate Last Dose  . cefTRIAXone (ROCEPHIN) injection 1 g  1 g Intramuscular Once Forrest Moron, MD        Allergies  Allergen Reactions  . Metformin And Related Other (See Comments)    Could not swallow    Social History   Socioeconomic History  . Marital status: Married    Spouse name: Not on file  . Number of children: Not on file  . Years of education: Not on file  . Highest education level: Not on file  Occupational History  . Not on file  Social Needs  . Financial resource strain: Not on file  . Food insecurity    Worry: Not on file    Inability: Not on file  . Transportation needs    Medical: Not on file    Non-medical: Not on file  Tobacco Use  . Smoking status: Current Every Day Smoker    Packs/day: 0.75    Years: 38.00    Pack years: 28.50    Types: Cigarettes  . Smokeless tobacco: Never Used  Substance and Sexual Activity  . Alcohol use: No    Alcohol/week: 0.0 standard drinks  . Drug use: No  . Sexual activity: Yes  Lifestyle  . Physical activity    Days per week: Not on file    Minutes per session: Not on file  . Stress: Not on file  Relationships  . Social Herbalist on phone: Not on file    Gets together: Not on file    Attends religious service: Not on file    Active member of club or organization: Not on file    Attends meetings of clubs or organizations: Not on file    Relationship status: Not on file  . Intimate partner violence    Fear of current or ex partner: Not on file    Emotionally abused: Not on file    Physically abused: Not on file    Forced sexual activity: Not on file  Other Topics Concern  . 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  HENT: Positive for congestion, sinus pain and sore throat.   Respiratory: Positive for cough, sputum production and wheezing.   Cardiovascular: Positive for chest pain. Negative for orthopnea.  Gastrointestinal: Negative for abdominal pain and diarrhea.  Skin: Negative.   Neurological: Positive for headaches. Negative for tremors and weakness.    Objective    GEN: WDWN, NAD, Non-toxic, Alert & Oriented x 3 PSYCH:  Normally interactive. Conversant. Not depressed or anxious appearing.  Calm demeanor.    Vitals as reported by the patient: Today's Vitals   08/25/19 1513  Temp: 100 F (37.8 C)  TempSrc: Oral  Weight: 160 lb (72.6 kg)  Height: 5' (1.524 m)    Diagnoses and all orders for this visit:  Acute upper respiratory infection -     Novel Coronavirus, NAA ([facility])  Other orders -     azithromycin (ZITHROMAX) 250 MG tablet; Take 1 tablet (250 mg total) by mouth daily for 6 days. -     Glycopyrrolate-Formoterol (BEVESPI AEROSPHERE) 9-4.8 MCG/ACT AERO; Inhale 1 puff into the lungs 2 (two) times daily.    Given the patient's COPD, fever, and current symptoms, I have recommended she get tested for Covid at [facility].  Order was completed.  In addition, I sent in a Z-pack to cover for PNA which is not unreasonable given her fever to 101 and high risk for complicated PNA.  Refilled inhaler and advised patient on use through the weekend.  Encouraged to use Pro-Air prn for difficulty breathing or cough.  She verbalized understanding. Will go get tested today.    I discussed the assessment and treatment plan with the patient. The patient was provided an opportunity to ask questions and all were answered. The patient agreed with the plan and demonstrated an understanding of the instructions.   The patient was advised to call back or seek an in-person evaluation if the symptoms worsen or if the condition  fails to improve as anticipated.  I provided 25 minutes of non-face-to-face time during this encounter.  Elyse Jarvis, NP  Primary Care at [facility]

## 2019-08-25 NOTE — Progress Notes (Signed)
Cold/ constant cough just started yesterday,  Hx of COPD No energy  Just got over UTI  Sore throat Sinus draniage Has a fever   Has taken Mucinex and has congestion in chest  Crys because she doesn't feel well.

## 2019-08-25 NOTE — Telephone Encounter (Signed)
Summary: cough, 100 temp    Patient requesting call back from NT to discuss cough and temp of 100. She would like advice.       Pt called to request an antibiotic for possible pneumonia. She stated that her temp was up to 100 last night but feels like it is down today. She took Aleve last night but did not recheck her temp.  Also her states her breathing is kinda labored. She has COPD. Family has has colds for the last couple of days. Had "walking pneumonia" and took a z pack in the past so is requesting that.  Took mucinex last night for severe cough. Also has draining sinuses. She is requesting a virtual appointment. Primary Care at [facility] notified for an appointment. Call transferred to the office.  Reason for Disposition . [1] Request for URGENT new prescription or refill of "essential" medication (i.e., likelihood of harm to patient if not taken) AND [2] triager unable to fill per unit policy  Answer Assessment - Initial Assessment Questions 1.   NAME of MEDICATION: "What medicine are you calling about?"     An antibiotic 2.   QUESTION: "What is your question?"     Can an antibiotic be called in for her? 3.   PRESCRIBING HCP: "Who prescribed it?" Reason: if prescribed by specialist, call should be referred to that group.     n/a 4. SYMPTOMS: "Do you have any symptoms?"     Cough, fever, draining sinuses 5. SEVERITY: If symptoms are present, ask "Are they mild, moderate or severe?"     Mild to moderate 6.  PREGNANCY:  "Is there any chance that you are pregnant?" "When was your last menstrual period?"     n/a  Protocols used: MEDICATION QUESTION CALL-A-AH

## 2019-08-25 NOTE — Telephone Encounter (Signed)
[phone]  °

## 2019-08-25 NOTE — Telephone Encounter (Signed)
Pt was speaking with someone, she's not sure who. Please advise at

## 2019-08-28 ENCOUNTER — Telehealth: Payer: Self-pay | Admitting: Family Medicine

## 2019-08-28 NOTE — Telephone Encounter (Signed)
Pt had a telemed with Judson Roch and was instructed to cb today and schedule xray if she was not feeling better for poss pneumonia. Pt still presenting with low grade fever and cough Per Wilfred Curtis, pt will need to go to [facility] for xray and Judson Roch will need to put in the order. Pt was advised of process and agreed

## 2019-08-29 ENCOUNTER — Other Ambulatory Visit: Payer: Self-pay | Admitting: Adult Health Nurse Practitioner

## 2019-08-29 ENCOUNTER — Other Ambulatory Visit: Payer: Self-pay

## 2019-08-29 ENCOUNTER — Ambulatory Visit: Payer: 59

## 2019-08-29 ENCOUNTER — Other Ambulatory Visit: Payer: Self-pay | Admitting: Registered"

## 2019-08-29 ENCOUNTER — Ambulatory Visit
Admission: RE | Admit: 2019-08-29 | Discharge: 2019-08-29 | Disposition: A | Discharge status: Home / Self Care | Payer: 59 | Source: Ambulatory Visit | Attending: Adult Health Nurse Practitioner | Admitting: Adult Health Nurse Practitioner

## 2019-08-29 DIAGNOSIS — R059 Cough, unspecified: Secondary | ICD-10-CM

## 2019-08-29 DIAGNOSIS — Z20822 Contact with and (suspected) exposure to covid-19: Secondary | ICD-10-CM

## 2019-08-29 DIAGNOSIS — R05 Cough: Secondary | ICD-10-CM

## 2019-08-30 LAB — NOVEL CORONAVIRUS, NAA: SARS-CoV-2, NAA: NOT DETECTED

## 2019-08-31 NOTE — Telephone Encounter (Signed)
Patient inquiring about imaging results and stating an alternate antibiotics was going to be sent in, please advise   [id number] [address], [place] [phone] (Phone) [phone] (Fax)

## 2019-09-01 ENCOUNTER — Other Ambulatory Visit: Payer: Self-pay | Admitting: Adult Health Nurse Practitioner

## 2019-09-01 MED ORDER — LEVOFLOXACIN 500 MG PO TABS: 500.0000 mg | ORAL_TABLET | Freq: Every day | ORAL | 0 refills | Status: DC

## 2019-09-01 NOTE — Progress Notes (Signed)
Spoke with patient regarding symptoms.  Covid was negative and Xray was not significant for PNA.  Given her history of lung disease and tobacco use, will cover her for 7 days with Levaquin.  I have warned her of potential side effects of Levaquin with her Achilles tendon. She is starting to get some of the mucus from her chest out and is feeling a little better.  She will go to the ER if she experiences any chest pain or SOB.  F/u if symptoms persist or worsen.

## 2019-09-01 NOTE — Telephone Encounter (Signed)
Please advise 

## 2019-09-06 ENCOUNTER — Other Ambulatory Visit: Payer: Self-pay | Admitting: Family Medicine

## 2019-09-21 ENCOUNTER — Other Ambulatory Visit: Payer: Self-pay | Admitting: Family Medicine

## 2019-09-21 DIAGNOSIS — E119 Type 2 diabetes mellitus without complications: Secondary | ICD-10-CM

## 2019-09-21 NOTE — Telephone Encounter (Signed)
Forwarding medication refill request to the clinical pool for review. 

## 2019-09-22 ENCOUNTER — Other Ambulatory Visit: Payer: Self-pay | Admitting: Family Medicine

## 2019-09-22 NOTE — Telephone Encounter (Signed)
Forwarding medication refill request to the clinical pool for review. 

## 2019-10-03 ENCOUNTER — Other Ambulatory Visit: Payer: Self-pay | Admitting: Family Medicine

## 2019-10-03 DIAGNOSIS — E1165 Type 2 diabetes mellitus with hyperglycemia: Secondary | ICD-10-CM

## 2019-10-04 ENCOUNTER — Telehealth: Payer: 59 | Admitting: Family Medicine

## 2019-10-16 ENCOUNTER — Other Ambulatory Visit: Payer: Self-pay | Admitting: Family Medicine

## 2019-10-17 ENCOUNTER — Other Ambulatory Visit: Payer: Self-pay | Admitting: Family Medicine

## 2019-10-17 DIAGNOSIS — E1165 Type 2 diabetes mellitus with hyperglycemia: Secondary | ICD-10-CM

## 2019-10-18 ENCOUNTER — Other Ambulatory Visit: Payer: Self-pay

## 2019-10-18 ENCOUNTER — Ambulatory Visit: Payer: 59 | Admitting: Family Medicine

## 2019-10-18 ENCOUNTER — Encounter: Payer: Self-pay | Admitting: Family Medicine

## 2019-10-18 VITALS — BP 154/80 | HR 91 | Temp 98.1°F | Ht 60.0 in | Wt 171.4 lb

## 2019-10-18 DIAGNOSIS — E1165 Type 2 diabetes mellitus with hyperglycemia: Secondary | ICD-10-CM

## 2019-10-18 DIAGNOSIS — L239 Allergic contact dermatitis, unspecified cause: Secondary | ICD-10-CM | POA: Diagnosis not present

## 2019-10-18 DIAGNOSIS — Z23 Encounter for immunization: Secondary | ICD-10-CM

## 2019-10-18 DIAGNOSIS — Z630 Problems in relationship with spouse or partner: Secondary | ICD-10-CM

## 2019-10-18 LAB — POCT GLYCOSYLATED HEMOGLOBIN (HGB A1C): Hemoglobin A1C: 8.6 % — AB (ref 4.0–5.6)

## 2019-10-18 MED ORDER — CETIRIZINE HCL 10 MG PO TABS: 10.0000 mg | ORAL_TABLET | Freq: Every day | ORAL | 11 refills | Status: DC

## 2019-10-18 MED ORDER — TRIAMCINOLONE ACETONIDE 0.5 % EX CREA: 1.0000 "application " | TOPICAL_CREAM | Freq: Three times a day (TID) | CUTANEOUS | 1 refills | Status: DC

## 2019-10-18 NOTE — Progress Notes (Signed)
Established Patient Office Visit  Subjective:  Patient ID: Felicia Hanson, female    DOB: 1956/09/10  Age: 64 y.o. MRN: [id number]  CC:  Chief Complaint  Patient presents with  . Diabetes    f/u   . Depression  . Eczema    dry back    HPI Felicia Hanson presents for   Diabetes Mellitus: Patient presents for follow up of diabetes.  Previous A1c 14% Lab Results  Component Value Date   HGBA1C 8.6 (A) 10/18/2019   She reports that her blood glucose runs fasting 130-150s She is taking Trulicity 0.75mg , Januvia and Glipizide She denies nausea, vomiting, diarrhea She states that she has a little bit better understanding of what to eat. Her urinary symptoms also cleared up.  Back rash She has been putting polysporin on her back She also uses Cerave and Eucerine She does laundry tide With bathing she uses dove  Wt Readings from Last 3 Encounters:  10/18/19 171 lb 6.4 oz (77.7 kg)  08/25/19 160 lb (72.6 kg)  06/26/19 169 lb 9.6 oz (76.9 kg)   She reports that her husband wont touch her and it causes her so much stress She states that he doesn't even help her apply meds to her back She feels like her appearance is a problem for him. They do not talk and she just spends time with the grandchildren. This is a great source of stress and depression for her. Depression screen [facility] 2/9 10/18/2019 08/25/2019 07/03/2019 06/26/2019 06/26/2019  Decreased Interest 3 1 0 - -  Down, Depressed, Hopeless 2 1 0 - -  PHQ - 2 Score 5 2 0 - -  Altered sleeping 3 0 - 3 3  Tired, decreased energy 2 0 - 3 3  Change in appetite 1 0 - 3 3  Feeling bad or failure about yourself  2 3 - 3 0  Trouble concentrating 1 0 - 0 0  Moving slowly or fidgety/restless 1 1 - 3 3  Suicidal thoughts 0 0 - 3 0  PHQ-9 Score 15 6 - - -  Difficult doing work/chores Very difficult Somewhat difficult - Somewhat difficult Somewhat difficult  Some recent data might be hidden    Past Medical History:  Diagnosis Date  .  Anxiety   . Depression   . Diabetes mellitus without complication ([place])   . Hyperlipidemia   . Hypertension   . Thyroid disease     Past Surgical History:  Procedure Laterality Date  . BREAST EXCISIONAL BIOPSY Left   . CHOLECYSTECTOMY    . COLON SURGERY     diverticulitis with perforation; s/p colon resection.  . Colonoscopy    . TUBAL LIGATION      Family History  Problem Relation Age of Onset  . Cancer Mother        unknown primary  . Hyperlipidemia Mother   . Breast cancer Paternal Aunt     Social History   Socioeconomic History  . Marital status: Married    Spouse name: Not on file  . Number of children: Not on file  . Years of education: Not on file  . Highest education level: Not on file  Occupational History  . Not on file  Social Needs  . Financial resource strain: Not on file  . Food insecurity    Worry: Not on file    Inability: Not on file  . Transportation needs    Medical: Not on file    Non-medical:  Not on file  Tobacco Use  . Smoking status: Current Every Day Smoker    Packs/day: 0.75    Years: 38.00    Pack years: 28.50    Types: Cigarettes  . Smokeless tobacco: Never Used  Substance and Sexual Activity  . Alcohol use: No    Alcohol/week: 0.0 standard drinks  . Drug use: No  . Sexual activity: Yes  Lifestyle  . Physical activity    Days per week: Not on file    Minutes per session: Not on file  . Stress: Not on file  Relationships  . Social Musician on phone: Not on file    Gets together: Not on file    Attends religious service: Not on file    Active member of club or organization: Not on file    Attends meetings of clubs or organizations: Not on file    Relationship status: Not on file  . Intimate partner violence    Fear of current or ex partner: Not on file    Emotionally abused: Not on file    Physically abused: Not on file    Forced sexual activity: Not on file  Other Topics Concern  . 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 ACCU-CHEK AVIVA PLUS test strip daily. for testing  2  . ALPRAZolam (XANAX) 1 MG tablet Take 0.5 mg by mouth 3 (three) times daily as needed for anxiety or sleep.     Marland Kitchen amphetamine-dextroamphetamine (ADDERALL) 20 MG tablet Reported on 11/26/2015  0  . aspirin 81 MG tablet Take 81 mg by mouth daily.    . DULoxetine (CYMBALTA) 60 MG capsule TAKE 2 CAPSULES BY MOUTH DAILY 180 capsule 1  . glipiZIDE (GLUCOTROL XL) 10 MG 24 hr tablet TAKE 1 TABLET (10 MG TOTAL) BY MOUTH DAILY WITH BREAKFAST. 90 tablet 0  . JANUVIA 50 MG tablet TAKE 1 TABLET BY MOUTH EVERY DAY 90 tablet 0  . levothyroxine (SYNTHROID) 88 MCG tablet TAKE 1 TABLET (88 MCG TOTAL) BY MOUTH DAILY BEFORE BREAKFAST. 90 tablet 0  . lisinopril (ZESTRIL) 2.5 MG tablet Take 1 tablet (2.5 mg total) by mouth daily. 90 tablet 1  . metoprolol tartrate (LOPRESSOR) 50 MG tablet TAKE 1 TABLET BY MOUTH TWICE A DAY 180 tablet 0  . ondansetron (ZOFRAN ODT) 4 MG disintegrating tablet Take 1 tablet (4 mg total) by mouth every 8 (eight) hours as needed for nausea or vomiting. 20 tablet 1  . pravastatin (PRAVACHOL) 40 MG tablet TAKE 1 TABLET BY MOUTH EVERY DAY 90 tablet 2  . TRULICITY 0.75 MG/0.5ML SOPN INJECT 0.75 MG INTO THE SKIN ONCE A WEEK. 2 pen 1  . levofloxacin (LEVAQUIN) 500 MG tablet Take 1 tablet (500 mg total) by mouth daily. (Patient not taking: Reported on 10/18/2019) 7 tablet 0   Facility-Administered Medications Prior to Visit  Medication Dose Route Frequency Provider Last Rate Last Dose  . cefTRIAXone (ROCEPHIN) injection 1 g  1 g Intramuscular Once Doristine Bosworth, MD        Allergies  Allergen Reactions  . Metformin And  Related Other (See Comments)     Could not swallow    ROS Review of Systems Review of Systems  Constitutional: Negative for activity change, appetite change, chills and fever.  HENT: Negative for congestion, nosebleeds, trouble swallowing and voice change.   Respiratory: Negative for cough, shortness of breath and wheezing.   Gastrointestinal: Negative for diarrhea, nausea and vomiting.  Genitourinary: Negative for difficulty urinating, dysuria, flank pain and hematuria.  Musculoskeletal: Negative for back pain, joint swelling and neck pain.  Neurological: Negative for dizziness, speech difficulty, light-headedness and numbness.  See HPI. All other review of systems negative.     Objective:    Physical Exam  BP (!) 154/80   Pulse 91   Temp 98.1 F (36.7 C) (Oral)   Ht 5' (1.524 m)   Wt 171 lb 6.4 oz (77.7 kg)   SpO2 96%   BMI 33.47 kg/m  Wt Readings from Last 3 Encounters:  10/18/19 171 lb 6.4 oz (77.7 kg)  08/25/19 160 lb (72.6 kg)  06/26/19 169 lb 9.6 oz (76.9 kg)   Physical Exam  Constitutional: Oriented to person, place, and time. Appears well-developed and well-nourished.  HENT:  Head: Normocephalic and atraumatic.  Eyes: Conjunctivae and EOM are normal.  Cardiovascular: Normal rate, regular rhythm, normal heart sounds and intact distal pulses.  No murmur heard. Pulmonary/Chest: Effort normal and breath sounds normal. No stridor. No respiratory distress. Has no wheezes.  Neurological: Is alert and oriented to person, place, and time.  Skin: Skin is warm. Capillary refill takes less than 2 seconds.  Numerous lesions with appearance of scaling and crusting with excoriation and scabs. Psychiatric: Has a normal mood and affect. Behavior is normal. Judgment and thought content normal.    Health Maintenance Due  Topic Date Due  . PAP SMEAR-Modifier  12/12/1975  . COLONOSCOPY  12/11/2004  . OPHTHALMOLOGY EXAM  10/30/2018  . MAMMOGRAM  10/28/2019    There are no preventive care reminders to  display for this patient.  Lab Results  Component Value Date   TSH CANCELED 06/26/2019   Lab Results  Component Value Date   WBC 10.3 02/03/2019   HGB 13.6 02/03/2019   HCT 39.9 02/03/2019   MCV 85 02/03/2019   PLT 217 02/03/2019   Lab Results  Component Value Date   NA 138 10/18/2019   K 4.8 10/18/2019   CO2 28 10/18/2019   GLUCOSE 219 (H) 10/18/2019   BUN 5 (L) 10/18/2019   CREATININE 0.71 10/18/2019   BILITOT 0.6 10/18/2019   ALKPHOS 92 10/18/2019   AST 31 10/18/2019   ALT 12 10/18/2019   PROT 7.8 10/18/2019   ALBUMIN 4.3 10/18/2019   CALCIUM 9.8 10/18/2019   ANIONGAP 10 09/13/2016   Lab Results  Component Value Date   CHOL 177 10/18/2019   Lab Results  Component Value Date   HDL 47 10/18/2019   Lab Results  Component Value Date   LDLCALC 97 10/18/2019   Lab Results  Component Value Date   TRIG 192 (H) 10/18/2019   Lab Results  Component Value Date   CHOLHDL 3.8 10/18/2019   Lab Results  Component Value Date   HGBA1C 8.6 (A) 10/18/2019      Assessment & Plan:   Problem List Items Addressed This Visit    None    Visit Diagnoses    Need for prophylactic vaccination and inoculation against influenza    -  Primary   Relevant Orders   Influenza (Seasonal) (Completed)  Uncontrolled type 2 diabetes mellitus with hyperglycemia (HCC)    -  Improved compliance with meds  Diabetes improving  Pt diet improving   Eczema, allergic       Relevant Medications   cetirizine (ZYRTEC) 10 MG tablet   triamcinolone cream (KENALOG) 0.5 %    Marital conflict - this is a great source of stress and depression for her She feels as thought her husband is not attracted to her She does not think anything can help this  Offered encouraging words and support for counseling  Continue antidepressants  Meds ordered this encounter  Medications  . cetirizine (ZYRTEC) 10 MG tablet    Sig: Take 1 tablet (10 mg total) by mouth daily.    Dispense:  30 tablet    Refill:   11  . triamcinolone cream (KENALOG) 0.5 %    Sig: Apply 1 application topically 3 (three) times daily. Apply to back and forearms, avoid the face    Dispense:  454 g    Refill:  1    Follow-up: No follow-ups on file.   A total of 45 minutes were spent face-to-face with the patient during this encounter and over half of that time was spent on counseling and coordination of care.  Doristine Bosworth, MD

## 2019-10-18 NOTE — Patient Instructions (Signed)
° ° ° °  If you have lab work done today you will be contacted with your lab results within the next 2 weeks.  If you have not heard from us then please contact us. The fastest way to get your results is to register for My Chart. ° ° °IF you received an x-ray today, you will receive an invoice from [facility]. Please contact [facility] at [phone] with questions or concerns regarding your invoice.  ° °IF you received labwork today, you will receive an invoice from [facility]. Please contact [facility] at [phone] with questions or concerns regarding your invoice.  ° °Our billing staff will not be able to assist you with questions regarding bills from these companies. ° °You will be contacted with the lab results as soon as they are available. The fastest way to get your results is to activate your My Chart account. Instructions are located on the last page of this paperwork. If you have not heard from us regarding the results in 2 weeks, please contact this office. °  ° ° ° °

## 2019-10-19 LAB — LIPID PANEL
Chol/HDL Ratio: 3.8 ratio (ref 0.0–4.4)
Cholesterol, Total: 177 mg/dL (ref 100–199)
HDL: 47 mg/dL (ref >39)
LDL Chol Calc (NIH): 97 mg/dL (ref 0–99)
Triglycerides: 192 mg/dL — ABNORMAL HIGH (ref 0–149)
VLDL Cholesterol Cal: 33 mg/dL (ref 5–40)

## 2019-10-19 LAB — CMP14+EGFR
ALT: 12 IU/L (ref 0–32)
AST: 31 IU/L (ref 0–40)
Albumin/Globulin Ratio: 1.2 (ref 1.2–2.2)
Albumin: 4.3 g/dL (ref 3.8–4.8)
Alkaline Phosphatase: 92 IU/L (ref 39–117)
BUN/Creatinine Ratio: 7 — ABNORMAL LOW (ref 12–28)
BUN: 5 mg/dL — ABNORMAL LOW (ref 8–27)
Bilirubin Total: 0.6 mg/dL (ref 0.0–1.2)
CO2: 28 mmol/L (ref 20–29)
Calcium: 9.8 mg/dL (ref 8.7–10.3)
Chloride: 97 mmol/L (ref 96–106)
Creatinine, Ser: 0.71 mg/dL (ref 0.57–1.00)
GFR calc Af Amer: 104 mL/min/{1.73_m2} (ref >59)
GFR calc non Af Amer: 90 mL/min/{1.73_m2} (ref >59)
Globulin, Total: 3.5 g/dL (ref 1.5–4.5)
Glucose: 219 mg/dL — ABNORMAL HIGH (ref 65–99)
Potassium: 4.8 mmol/L (ref 3.5–5.2)
Sodium: 138 mmol/L (ref 134–144)
Total Protein: 7.8 g/dL (ref 6.0–8.5)

## 2019-11-10 ENCOUNTER — Ambulatory Visit
Admission: EM | Admit: 2019-11-10 | Discharge: 2019-11-10 | Disposition: A | Discharge status: Home / Self Care | Payer: 59 | Attending: Physician Assistant | Admitting: Physician Assistant

## 2019-11-10 ENCOUNTER — Other Ambulatory Visit: Payer: Self-pay

## 2019-11-10 ENCOUNTER — Encounter: Payer: Self-pay | Admitting: Emergency Medicine

## 2019-11-10 DIAGNOSIS — W19XXXA Unspecified fall, initial encounter: Secondary | ICD-10-CM | POA: Diagnosis not present

## 2019-11-10 DIAGNOSIS — M25532 Pain in left wrist: Secondary | ICD-10-CM

## 2019-11-10 MED ORDER — MELOXICAM 7.5 MG PO TABS: 7.5000 mg | ORAL_TABLET | Freq: Every day | ORAL | 0 refills | Status: DC

## 2019-11-10 NOTE — ED Triage Notes (Signed)
Pt presents to [facility] [place] after falling backwards off of a step stool and catching herself with her left arm.  C/o left wrist pain at this time.

## 2019-11-10 NOTE — ED Notes (Signed)
Patient able to ambulate independently  

## 2019-11-10 NOTE — Discharge Instructions (Signed)
Start Mobic. Do not take ibuprofen (motrin/advil)/ naproxen (aleve) while on mobic. Ice compress, elevation, rest. This may take a few weeks to completely goes away, but should be better each week. If symptoms not improving, follow up with PCP/orthopedics for further evaluation needed.

## 2019-11-10 NOTE — ED Provider Notes (Signed)
[facility]    CSN: [id number] Arrival date & time: 11/10/19  1954      History   Chief Complaint Chief Complaint  Patient presents with  . Fall  . Wrist Pain    HPI Felicia Hanson is a 63 y.o. female.   63 year old female who is left-hand dominant comes in for left wrist pain after fall shortly prior to arrival.  Patient was on a stepstool when she slipped and fell to the side.  She broke her fall with left hand outstretched.  Denies head injury, loss of consciousness.  Since then, she has had pain with range of motion.  Denies pain at rest.  Denies swelling, contusion.  Denies numbness, tingling.  Has not taken anything for the symptoms.      Past Medical History:  Diagnosis Date  . Anxiety   . Depression   . Diabetes mellitus without complication (HCC)   . Hyperlipidemia   . Hypertension   . Thyroid disease     Patient Active Problem List   Diagnosis Date Noted  . Type 2 diabetes mellitus without complication, without long-term current use of insulin (HCC) 10/26/2017  . Generalized anxiety disorder 01/10/2014  . Vitamin D deficiency 01/10/2014  . Essential hypertension, benign 01/10/2014  . Pure hypercholesterolemia 01/10/2014  . Hypothyroidism 01/10/2014    Past Surgical History:  Procedure Laterality Date  . BREAST EXCISIONAL BIOPSY Left   . CHOLECYSTECTOMY    . COLON SURGERY     diverticulitis with perforation; s/p colon resection.  . Colonoscopy    . TUBAL LIGATION      OB History   No obstetric history on file.      Home Medications    Prior to Admission medications   Medication Sig Start Date End Date Taking? Authorizing Provider  ACCU-CHEK AVIVA PLUS test strip daily. for testing 03/11/18   [provider]  ALPRAZolam Prudy Feeler) 1 MG tablet Take 0.5 mg by mouth 3 (three) times daily as needed for anxiety or sleep.  08/21/13   [provider]  amphetamine-dextroamphetamine (ADDERALL) 20 MG tablet Reported on  11/26/2015 06/14/15   [provider]  aspirin 81 MG tablet Take 81 mg by mouth daily.    [provider]  cetirizine (ZYRTEC) 10 MG tablet Take 1 tablet (10 mg total) by mouth daily. 10/18/19   Doristine Bosworth, MD  DULoxetine (CYMBALTA) 60 MG capsule TAKE 2 CAPSULES BY MOUTH DAILY 09/06/19   Collie Siad A, MD  glipiZIDE (GLUCOTROL XL) 10 MG 24 hr tablet TAKE 1 TABLET (10 MG TOTAL) BY MOUTH DAILY WITH BREAKFAST. 10/16/19   Doristine Bosworth, MD  JANUVIA 50 MG tablet TAKE 1 TABLET BY MOUTH EVERY DAY 09/22/19   Doristine Bosworth, MD  levothyroxine (SYNTHROID) 88 MCG tablet TAKE 1 TABLET (88 MCG TOTAL) BY MOUTH DAILY BEFORE BREAKFAST. 08/05/19   Stallings, Zoe A, MD  lisinopril (ZESTRIL) 2.5 MG tablet Take 1 tablet (2.5 mg total) by mouth daily. 06/26/19   Doristine Bosworth, MD  meloxicam (MOBIC) 7.5 MG tablet Take 1 tablet (7.5 mg total) by mouth daily. 11/10/19   Cathie Hoops, Jazzmyne Rasnick V, PA-C  metoprolol tartrate (LOPRESSOR) 50 MG tablet TAKE 1 TABLET BY MOUTH TWICE A DAY 09/22/19   Collie Siad A, MD  ondansetron (ZOFRAN ODT) 4 MG disintegrating tablet Take 1 tablet (4 mg total) by mouth every 8 (eight) hours as needed for nausea or vomiting. 07/14/19   Doristine Bosworth, MD  pravastatin (PRAVACHOL) 40 MG tablet TAKE 1 TABLET BY MOUTH EVERY DAY 06/25/19   Delia Chimes A, MD  triamcinolone cream (KENALOG) 0.5 % Apply 1 application topically 3 (three) times daily. Apply to back and forearms, avoid the face 10/18/19   Forrest Moron, MD  TRULICITY 8.41 YS/0.6TK SOPN INJECT 0.75 MG INTO THE SKIN ONCE A WEEK. 10/17/19   Forrest Moron, MD    Family History Family History  Problem Relation Age of Onset  . Cancer Mother        unknown primary  . Hyperlipidemia Mother   . Breast cancer Paternal Aunt     Social History Social History   Tobacco Use  . Smoking status: Current Every Day Smoker    Packs/day: 0.75    Years: 38.00    Pack years: 28.50    Types: Cigarettes  . Smokeless  tobacco: Never Used  Substance Use Topics  . Alcohol use: No    Alcohol/week: 0.0 standard drinks  . Drug use: No     Allergies   Metformin and related   Review of Systems Review of Systems  Reason unable to perform ROS: See HPI as above.     Physical Exam Triage Vital Signs ED Triage Vitals  Enc Vitals Group     BP 11/10/19 2001 (!) 178/88     Pulse Rate 11/10/19 2001 (!) 105     Resp 11/10/19 2001 20     Temp 11/10/19 2001 99.2 F (37.3 C)     Temp Source 11/10/19 2001 Oral     SpO2 11/10/19 2001 97 %     Weight --      Height --      Head Circumference --      Peak Flow --      Pain Score 11/10/19 2003 5     Pain Loc --      Pain Edu? --      Excl. in [place]? --    No data found.  Updated Vital Signs BP (!) 178/88 (BP Location: Left Arm)   Pulse (!) 105   Temp 99.2 F (37.3 C) (Oral)   Resp 20   SpO2 97%   Physical Exam Constitutional:      General: She is not in acute distress.    Appearance: She is well-developed. She is not diaphoretic.  HENT:     Head: Normocephalic and atraumatic.  Eyes:     Conjunctiva/sclera: Conjunctivae normal.     Pupils: Pupils are equal, round, and reactive to light.  Pulmonary:     Effort: Pulmonary effort is normal. No respiratory distress.  Musculoskeletal:     Comments: No swelling, erythema, warmth, contusion, deformity.  No tenderness to palpation of the elbow, forearm, wrist, hand.  Full range of motion, however, supination and pronation causes pain.  Strength of the wrist deferred.  Grip strength normal ankle bilaterally.  Sensation intact and equal bilaterally.  Radial pulse 2+, cap refill less than 2 seconds.  Skin:    General: Skin is warm and dry.  Neurological:     Mental Status: She is alert and oriented to person, place, and time.     UC Treatments / Results  Labs (all labs ordered are listed, but only abnormal results are displayed) Labs Reviewed - No data to display  EKG   Radiology No results  found.  Procedures Procedures (including critical care time)  Medications Ordered in UC Medications - No data to display  Initial Impression /  Assessment and Plan / UC Course  I have reviewed the triage vital signs and the nursing notes.  Pertinent labs & imaging results that were available during my care of the patient were reviewed by me and considered in my medical decision making (see chart for details).    Given pain only reproducible during range of motion, lower suspicion for fractures at this time.  Will provide wrist splint, and have patient start ice compress, elevation, NSAIDs.  Return precautions given.  Patient expresses understanding and agrees to plan.  Final Clinical Impressions(s) / UC Diagnoses   Final diagnoses:  Left wrist pain   ED Prescriptions    Medication Sig Dispense Auth. Provider   meloxicam (MOBIC) 7.5 MG tablet Take 1 tablet (7.5 mg total) by mouth daily. 10 tablet Belinda Fisher, PA-C     PDMP not reviewed this encounter.   Belinda Fisher, PA-C 11/11/19 [phone]

## 2019-11-14 ENCOUNTER — Other Ambulatory Visit: Payer: Self-pay | Admitting: Family Medicine

## 2019-11-16 ENCOUNTER — Telehealth: Payer: Self-pay | Admitting: Family Medicine

## 2019-11-16 NOTE — Telephone Encounter (Signed)
Pt is under the impression she was going to be able to have an order placed for her to get an xray. I tried to make an appointment for her, she wanted to wait for a response. Please advise at [phone].

## 2019-11-16 NOTE — Telephone Encounter (Signed)
Please advise.  Is she suppose to have an order for x ray?

## 2019-11-16 NOTE — Telephone Encounter (Signed)
LVM for pt to c/b and schedule appt with provider and x-ray from recent fall    FR

## 2019-11-17 NOTE — Telephone Encounter (Signed)
This should be an office visit.  So that the xray can be addressed.  It can be with a different provider as I know my slots are full.

## 2019-11-22 NOTE — Telephone Encounter (Signed)
Patient was informed and will call back after christmas to schedule an tele-med visit cause she do not want to come into the office due to covid

## 2019-11-29 ENCOUNTER — Ambulatory Visit (INDEPENDENT_AMBULATORY_CARE_PROVIDER_SITE_OTHER): Payer: 59

## 2019-11-29 ENCOUNTER — Encounter: Payer: Self-pay | Admitting: Family Medicine

## 2019-11-29 ENCOUNTER — Other Ambulatory Visit: Payer: Self-pay

## 2019-11-29 ENCOUNTER — Ambulatory Visit: Payer: 59 | Admitting: Family Medicine

## 2019-11-29 ENCOUNTER — Telehealth: Payer: Self-pay | Admitting: Family Medicine

## 2019-11-29 VITALS — BP 170/96 | HR 93 | Temp 97.8°F | Resp 16 | Ht 60.0 in | Wt 169.4 lb

## 2019-11-29 DIAGNOSIS — M25532 Pain in left wrist: Secondary | ICD-10-CM

## 2019-11-29 DIAGNOSIS — S52532B Colles' fracture of left radius, initial encounter for open fracture type I or II: Secondary | ICD-10-CM | POA: Diagnosis not present

## 2019-11-29 DIAGNOSIS — W19XXXA Unspecified fall, initial encounter: Secondary | ICD-10-CM | POA: Diagnosis not present

## 2019-11-29 DIAGNOSIS — Y92009 Unspecified place in unspecified non-institutional (private) residence as the place of occurrence of the external cause: Secondary | ICD-10-CM

## 2019-11-29 NOTE — Progress Notes (Signed)
Patient ID: [name], female    DOB: 01-12-1956  Age: 63 y.o. MRN: [id number]  Chief Complaint  Patient presents with  . Wrist Pain    pt fell off a step lader, and cought herself with her L hand. pt went to an urgent care no x-rays were taken she was told if it gets worse top go get an x-ray. pt fell on the same hand again about a week later. pt would like an x-ray.     Subjective:   About 2 weeks ago patient was on a stepstool up 3 steps and was adjusting something on her Christmas tree.  She was coming back down and fell, landing on her left wrist extended behind her.  A few days ago she tripped again and landed on the left wrist when tripping over a purse in her child's room.  After the first injury she went to the urgent care at [facility] and it was right at closing time.  They put the splint on her but did not think it was broken however she did not have an x-ray at that time.  It is continued to hurt when she does things at certain angles.  The pain is primarily in the wrist and the back of the hand, but some on up the left forearm.  She said it was bruised for a little while.  Current allergies, medications, problem list, past/family and social histories reviewed.  Objective:  BP (!) 170/96   Pulse 93   Temp 97.8 F (36.6 C) (Temporal)   Resp 16   Ht 5' (1.524 m)   Wt 169 lb 6.4 oz (76.8 kg)   SpO2 96%   BMI 33.08 kg/m   No major acute distress.  No bruising visible at this time.  Good range of motion of her fingers.  Strength of hand fair.  Pulse good.  Tender along the wrist line and the distal radius.  Not a real clear-cut point tenderness.  X-ray does reveal a fracture with intra-articular involvement.  However it 2 weeks it is probably pretty stable.  Assessment & Plan:   Assessment: 1. Left wrist pain   2. Fall as cause of accidental injury in home as place of occurrence, initial encounter   3. Type I or II open Colles' fracture of left radius, initial  encounter       Plan: X-ray wrist.  By the history it is suspicious for a fracture since it is hurting like this after 2 weeks.  Although she has had a couple falls.  I do not think she is terribly fall prone, just a little bit of bad luck recently.  Orders Placed This Encounter  Procedures  . DG Wrist Complete Left    Standing Status:   Future    Number of Occurrences:   1    Standing Expiration Date:   11/28/2020    Order Specific Question:   Reason for Exam (SYMPTOM  OR DIAGNOSIS REQUIRED)    Answer:   wrist pain, s/p fall (follow-up)    Order Specific Question:   Preferred imaging location?    Answer:   External  . Ambulatory referral to Orthopedic Surgery    Referral Priority:   Urgent    Referral Type:   Surgical    Referral Reason:   Specialty Services Required    Requested Specialty:   Orthopedic Surgery    Number of Visits Requested:   1    No orders of the  defined types were placed in this encounter.        Patient Instructions  Referral to orthopedic surgery is being made.  You should see 1 as soon as possible.  Since her sister works at [employer], you can consider going there.  Please check to make sure that your insurance covers the services there.  Problems with referral please call back and speak to the referrals desk.  Take Tylenol 500 mg 2 pills 3 times daily.  You can also take the Aleve 2 pills twice daily.  A diabetic should not continue Aleve long-term, but for a week or so is fine.  Continue wearing the splint until seen by the orthopedist.  Take caution to avoid falls.  Return as needed    Return if symptoms worsen or fail to improve.   Ruben Reason, MD 11/29/2019

## 2019-11-29 NOTE — Telephone Encounter (Signed)
Please change referral to Dr Mardelle Matte office

## 2019-11-29 NOTE — Telephone Encounter (Signed)
Pt states she had wanted to go to [facility], and see Dr Mardelle Matte (not [facility])  Dr Linna Darner put referral in as urgent.

## 2019-11-29 NOTE — Patient Instructions (Addendum)
Referral to orthopedic surgery is being made.  You should see 1 as soon as possible.  Since her sister works at [employer], you can consider going there.  Please check to make sure that your insurance covers the services there.  Problems with referral please call back and speak to the referrals desk.  Take Tylenol 500 mg 2 pills 3 times daily.  You can also take the Aleve 2 pills twice daily.  A diabetic should not continue Aleve long-term, but for a week or so is fine.  Continue wearing the splint until seen by the orthopedist.  Take caution to avoid falls.  Return as needed

## 2019-12-04 DIAGNOSIS — S52502D Unspecified fracture of the lower end of left radius, subsequent encounter for closed fracture with routine healing: Secondary | ICD-10-CM | POA: Diagnosis not present

## 2019-12-06 ENCOUNTER — Ambulatory Visit: Payer: Self-pay | Admitting: Orthopaedic Surgery

## 2019-12-07 ENCOUNTER — Other Ambulatory Visit: Payer: Self-pay | Admitting: Family Medicine

## 2019-12-07 DIAGNOSIS — E1165 Type 2 diabetes mellitus with hyperglycemia: Secondary | ICD-10-CM

## 2019-12-07 NOTE — Telephone Encounter (Signed)
Forwarding medication refill request to the clinical pool for review. 

## 2019-12-13 DIAGNOSIS — S52502D Unspecified fracture of the lower end of left radius, subsequent encounter for closed fracture with routine healing: Secondary | ICD-10-CM | POA: Diagnosis not present

## 2019-12-17 ENCOUNTER — Other Ambulatory Visit: Payer: Self-pay | Admitting: Family Medicine

## 2019-12-17 DIAGNOSIS — E119 Type 2 diabetes mellitus without complications: Secondary | ICD-10-CM

## 2019-12-22 ENCOUNTER — Telehealth: Payer: Self-pay | Admitting: Family Medicine

## 2019-12-22 NOTE — Telephone Encounter (Signed)
Pt scheduled for a covid shot on Monday 12/23/19. She is needing a letter stating-that pt should get the covid shot. Where she is getting her shot has asked her for this letter. Pt says she can not stop crying because she is so scared of her anxiety. Please advise pt asap, cell phone and land line are both fine to call pt.

## 2019-12-25 ENCOUNTER — Telehealth: Payer: Self-pay | Admitting: Family Medicine

## 2019-12-25 NOTE — Telephone Encounter (Signed)
Pt requesting a note from Dr.Stalling stating that she recommends the patient to receive the vaccine. She is being asked  To present this in order to receive the vaccine   Please advise   Her appt is scheduled for today @ 3:00

## 2019-12-25 NOTE — Telephone Encounter (Signed)
Pt called back due to her appt being at 3:00 PM and she is waiting on a response. Pt would like a cb for pick up

## 2019-12-25 NOTE — Telephone Encounter (Signed)
Pt states she is at a [facility] COVID vaccine clinic.  They are stating that she must receive authorization from her provider before receiving the COVID vaccine.  Dr. Creta Levin reviewed and auth'ed.  Pt notified.

## 2019-12-26 ENCOUNTER — Other Ambulatory Visit: Payer: Self-pay | Admitting: Family Medicine

## 2020-01-01 ENCOUNTER — Telehealth: Payer: Self-pay | Admitting: Family Medicine

## 2020-01-01 NOTE — Telephone Encounter (Signed)
Medical clearance received thru fax placed in provider box in (physicans lounge)

## 2020-01-03 DIAGNOSIS — S52502D Unspecified fracture of the lower end of left radius, subsequent encounter for closed fracture with routine healing: Secondary | ICD-10-CM | POA: Diagnosis not present

## 2020-01-16 ENCOUNTER — Other Ambulatory Visit: Payer: Self-pay | Admitting: Family Medicine

## 2020-01-17 NOTE — Telephone Encounter (Signed)
Medical clearance form faxed back from [facility]. Office called and requested for provider to reference last page.  Office states original fax was sent back on 02/09 and have not heard back.  Second request will be placed in provider's box

## 2020-01-19 NOTE — Telephone Encounter (Signed)
Attempted to look for this paperwork. I have not seen it. Do you have it by chance?

## 2020-01-29 DIAGNOSIS — S52502D Unspecified fracture of the lower end of left radius, subsequent encounter for closed fracture with routine healing: Secondary | ICD-10-CM | POA: Diagnosis not present

## 2020-02-07 ENCOUNTER — Other Ambulatory Visit: Payer: Self-pay | Admitting: Family Medicine

## 2020-02-07 NOTE — Telephone Encounter (Signed)
Requested Prescriptions  Pending Prescriptions Disp Refills  . levothyroxine (SYNTHROID) 88 MCG tablet [Pharmacy Med Name: LEVOTHYROXINE 88 MCG TABLET] 90 tablet 1    Sig: TAKE 1 TABLET (88 MCG TOTAL) BY MOUTH DAILY BEFORE BREAKFAST.     Endocrinology:  Hypothyroid Agents Failed - 02/07/2020  1:03 AM      Failed - TSH needs to be rechecked within 3 months after an abnormal result. Refill until TSH is due.      Passed - TSH in normal range and within 360 days    TSH  Date Value Ref Range Status  06/26/2019 CANCELED uIU/mL     Comment:    [facility] was unable to collect sufficient specimen to perform the following test(s), and is providing the patient with re-collection instructions.  Result canceled by the ancillary.          Passed - Valid encounter within last 12 months    Recent Outpatient Visits          2 months ago Left wrist pain   Primary Care at [facility], Sandria Bales, MD   3 months ago Uncontrolled type 2 diabetes mellitus with hyperglycemia [facility])   Primary Care at [facility], Zoe A, MD   5 months ago Acute upper respiratory infection   Primary Care at [facility], Lonna Cobb, NP   7 months ago    Primary Care at [facility], [place], MD   7 months ago Type 2 diabetes mellitus with hyperglycemia, without long-term current use of insulin [facility])   Primary Care at [facility], Manus Rudd, MD

## 2020-02-09 ENCOUNTER — Other Ambulatory Visit: Payer: Self-pay | Admitting: Family Medicine

## 2020-02-09 DIAGNOSIS — E1165 Type 2 diabetes mellitus with hyperglycemia: Secondary | ICD-10-CM

## 2020-03-06 ENCOUNTER — Other Ambulatory Visit: Payer: Self-pay | Admitting: Family Medicine

## 2020-03-06 NOTE — Telephone Encounter (Signed)
Requested Prescriptions  Pending Prescriptions Disp Refills  . DULoxetine (CYMBALTA) 60 MG capsule [Pharmacy Med Name: DULOXETINE HCL DR 60 MG CAP] 180 capsule 0    Sig: TAKE 2 CAPSULES BY MOUTH DAILY     Psychiatry: Antidepressants - SNRI Failed - 03/06/2020  2:17 AM      Failed - Last BP in normal range    BP Readings from Last 1 Encounters:  11/29/19 (!) 170/96         Passed - Valid encounter within last 6 months    Recent Outpatient Visits          3 months ago Left wrist pain   Primary Care at [facility], Sandria Bales, MD   4 months ago Uncontrolled type 2 diabetes mellitus with hyperglycemia [facility])   Primary Care at [facility], Zoe A, MD   6 months ago Acute upper respiratory infection   Primary Care at [facility], Lonna Cobb, NP   8 months ago    Primary Care at [facility], [place], MD   8 months ago Type 2 diabetes mellitus with hyperglycemia, without long-term current use of insulin [facility])   Primary Care at [facility], Zoe A, MD             Last BP recorded was when patient was in the office with wrist injury / pain from a fall.

## 2020-03-11 ENCOUNTER — Telehealth: Payer: Self-pay | Admitting: Family Medicine

## 2020-03-11 ENCOUNTER — Other Ambulatory Visit: Payer: Self-pay

## 2020-03-11 DIAGNOSIS — E1165 Type 2 diabetes mellitus with hyperglycemia: Secondary | ICD-10-CM

## 2020-03-11 MED ORDER — TRULICITY 0.75 MG/0.5ML ~~LOC~~ SOAJ: 0.7500 mg | SUBCUTANEOUS | 1 refills | Status: DC

## 2020-03-11 NOTE — Telephone Encounter (Signed)
What is the name of the medication? TRULICITY 0.75 MG/0.5ML SOPN [[id number]]    Have you contacted your pharmacy to request a refill? Yes she needs an ov. She has an upcoming appointment with Dr. Neva Seat TOC on 04/03/20. She needs a shot of this script by 03/13/20/   Which pharmacy would you like this sent to? Pharmacy  [facility] [phone] [place], [place] - [address]  [address] [place], [place] [address]  Phone:  [phone] Fax:  [phone]  DEA #:  [id number]      Patient notified that their request is being sent to the clinical staff for review and that they should receive a call once it is complete. If they do not receive a call within 72 hours they can check with their pharmacy or our office.

## 2020-03-11 NOTE — Telephone Encounter (Signed)
She would like this in a 3 month supply her insurance will pay for this.

## 2020-03-14 ENCOUNTER — Other Ambulatory Visit: Payer: Self-pay | Admitting: Family Medicine

## 2020-03-14 NOTE — Telephone Encounter (Signed)
Requested Prescriptions  Pending Prescriptions Disp Refills  . glipiZIDE (GLUCOTROL XL) 10 MG 24 hr tablet [Pharmacy Med Name: GLIPIZIDE ER 10 MG TABLET] 90 tablet 0    Sig: TAKE 1 TABLET (10 MG TOTAL) BY MOUTH DAILY WITH BREAKFAST.     Endocrinology:  Diabetes - Sulfonylureas Failed - 03/14/2020 10:55 AM      Failed - HBA1C is between 0 and 7.9 and within 180 days    Hemoglobin A1C  Date Value Ref Range Status  10/18/2019 8.6 (A) 4.0 - 5.6 % Final   Hgb A1c MFr Bld  Date Value Ref Range Status  02/03/2019 8.9 (H) 4.8 - 5.6 % Final    Comment:             Prediabetes: 5.7 - 6.4          Diabetes: >6.4          Glycemic control for adults with diabetes: <7.0          Passed - Valid encounter within last 6 months    Recent Outpatient Visits          3 months ago Left wrist pain   Primary Care at [facility], Sandria Bales, MD   4 months ago Uncontrolled type 2 diabetes mellitus with hyperglycemia [facility])   Primary Care at [facility], Zoe A, MD   6 months ago Acute upper respiratory infection   Primary Care at [facility], Lonna Cobb, NP   8 months ago    Primary Care at [facility], [place], MD   8 months ago Type 2 diabetes mellitus with hyperglycemia, without long-term current use of insulin [facility])   Primary Care at [facility], Manus Rudd, MD      Future Appointments            In 2 weeks Shade Flood, MD Primary Care at [facility], [facility]           . metoprolol tartrate (LOPRESSOR) 50 MG tablet [Pharmacy Med Name: METOPROLOL TARTRATE 50 MG TAB] 180 tablet 0    Sig: TAKE 1 TABLET BY MOUTH TWICE A DAY     Cardiovascular:  Beta Blockers Failed - 03/14/2020 10:55 AM      Failed - Last BP in normal range    BP Readings from Last 1 Encounters:  11/29/19 (!) 170/96         Passed - Last Heart Rate in normal range    Pulse Readings from Last 1 Encounters:  11/29/19 93         Passed - Valid encounter within last 6 months    Recent Outpatient Visits      3 months ago Left wrist pain   Primary Care at [facility], Sandria Bales, MD   4 months ago Uncontrolled type 2 diabetes mellitus with hyperglycemia [facility])   Primary Care at [facility], Zoe A, MD   6 months ago Acute upper respiratory infection   Primary Care at [facility], Lonna Cobb, NP   8 months ago    Primary Care at [facility], [place], MD   8 months ago Type 2 diabetes mellitus with hyperglycemia, without long-term current use of insulin [facility])   Primary Care at [facility], Manus Rudd, MD      Future Appointments            In 2 weeks Neva Seat Asencion Partridge, MD Primary Care at [place], [facility]           .  pravastatin (PRAVACHOL) 40 MG tablet [Pharmacy Med Name: PRAVASTATIN SODIUM 40 MG TAB] 90 tablet 2    Sig: TAKE 1 TABLET BY MOUTH EVERY DAY     Cardiovascular:  Antilipid - Statins Failed - 03/14/2020 10:55 AM      Failed - Triglycerides in normal range and within 360 days    Triglycerides  Date Value Ref Range Status  10/18/2019 192 (H) 0 - 149 mg/dL Final         Passed - Total Cholesterol in normal range and within 360 days    Cholesterol, Total  Date Value Ref Range Status  10/18/2019 177 100 - 199 mg/dL Final         Passed - LDL in normal range and within 360 days    LDL Chol Calc (NIH)  Date Value Ref Range Status  10/18/2019 97 0 - 99 mg/dL Final         Passed - HDL in normal range and within 360 days    HDL  Date Value Ref Range Status  10/18/2019 47 >39 mg/dL Final         Passed - Patient is not pregnant      Passed - Valid encounter within last 12 months    Recent Outpatient Visits          3 months ago Left wrist pain   Primary Care at [facility], Fenton Malling, MD   4 months ago Uncontrolled type 2 diabetes mellitus with hyperglycemia [facility])   Primary Care at [facility], [place], MD   6 months ago Acute upper respiratory infection   Primary Care at [facility], Lorelee [place], NP   8 months ago    Primary Care at [facility], [place], MD   8 months ago Type 2 diabetes mellitus with hyperglycemia, without long-term current use of insulin [facility])   Primary Care at [facility], Arlie Solomons, MD      Future Appointments            In 2 weeks Carlota Raspberry Ranell Patrick, MD Primary Care at [place], [facility]

## 2020-03-20 DIAGNOSIS — S52502D Unspecified fracture of the lower end of left radius, subsequent encounter for closed fracture with routine healing: Secondary | ICD-10-CM | POA: Diagnosis not present

## 2020-03-26 ENCOUNTER — Other Ambulatory Visit: Payer: Self-pay | Admitting: Family Medicine

## 2020-03-26 DIAGNOSIS — Z1231 Encounter for screening mammogram for malignant neoplasm of breast: Secondary | ICD-10-CM

## 2020-04-03 ENCOUNTER — Inpatient Hospital Stay: Admission: RE | Admit: 2020-04-03 | Payer: 59 | Source: Ambulatory Visit

## 2020-04-03 ENCOUNTER — Other Ambulatory Visit: Payer: Self-pay

## 2020-04-03 ENCOUNTER — Ambulatory Visit (INDEPENDENT_AMBULATORY_CARE_PROVIDER_SITE_OTHER): Payer: Medicare Other | Admitting: Family Medicine

## 2020-04-03 ENCOUNTER — Encounter: Payer: Self-pay | Admitting: Family Medicine

## 2020-04-03 VITALS — BP 151/84 | HR 87 | Temp 97.0°F | Ht 60.0 in | Wt 166.0 lb

## 2020-04-03 DIAGNOSIS — L659 Nonscarring hair loss, unspecified: Secondary | ICD-10-CM

## 2020-04-03 DIAGNOSIS — E1165 Type 2 diabetes mellitus with hyperglycemia: Secondary | ICD-10-CM | POA: Diagnosis not present

## 2020-04-03 DIAGNOSIS — I10 Essential (primary) hypertension: Secondary | ICD-10-CM

## 2020-04-03 DIAGNOSIS — F418 Other specified anxiety disorders: Secondary | ICD-10-CM | POA: Diagnosis not present

## 2020-04-03 DIAGNOSIS — Z1211 Encounter for screening for malignant neoplasm of colon: Secondary | ICD-10-CM | POA: Diagnosis not present

## 2020-04-03 DIAGNOSIS — R21 Rash and other nonspecific skin eruption: Secondary | ICD-10-CM | POA: Diagnosis not present

## 2020-04-03 DIAGNOSIS — R7989 Other specified abnormal findings of blood chemistry: Secondary | ICD-10-CM | POA: Diagnosis not present

## 2020-04-03 DIAGNOSIS — E7800 Pure hypercholesterolemia, unspecified: Secondary | ICD-10-CM | POA: Diagnosis not present

## 2020-04-03 DIAGNOSIS — E034 Atrophy of thyroid (acquired): Secondary | ICD-10-CM
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
## 2020-04-03 NOTE — Patient Instructions (Addendum)
  I do recommend meeting with counselor. Here are a few options for counseling:  [facility]:  [phone]  [facility] [phone]  [facility] [phone] Crisis Line [phone]  Start lisinopril - let me know if refill needed.   I will refer you to dermatology for rash on scalp and face. Ok to use steroid cream triamcinolone for itching if needed - avoid eyes/nose/mouth.   I will check some blood work, follow-up in 2 weeks to discuss those results, your medications and other concerns as we discussed today further.  If any return of dizziness, be seen right away.  Do check your blood sugars at that time and make sure they are not low.  Lyla Son some form of candy or glucose with you in case blood sugar does drop below.  Return to the clinic or go to the nearest emergency room if any of your symptoms worsen or new symptoms occur.      If you have lab work done today you will be contacted with your lab results within the next 2 weeks.  If you have not heard from [place] then please contact us. The fastest way to get your results is to register for My Chart.   IF you received an x-ray today, you will receive an invoice from [facility]. Please contact [facility] at [phone] with questions or concerns regarding your invoice.   IF you received labwork today, you will receive an invoice from [facility]. Please contact [facility] at [phone] with questions or concerns regarding your invoice.   Our billing staff will not be able to assist you with questions regarding bills from these companies.  You will be contacted with the lab results as soon as they are available. The fastest way to get your results is to activate your My Chart account. Instructions are located on the last page of this paperwork. If you have not heard from [place] regarding the results in 2 weeks, please contact this  office.

## 2020-04-04 ENCOUNTER — Other Ambulatory Visit: Payer: Self-pay | Admitting: Family Medicine

## 2020-04-04 ENCOUNTER — Inpatient Hospital Stay: Admission: RE | Admit: 2020-04-04 | Payer: Self-pay | Source: Ambulatory Visit

## 2020-04-04 DIAGNOSIS — Z1231 Encounter for screening mammogram for malignant neoplasm of breast: Secondary | ICD-10-CM

## 2020-04-04 LAB — COMPREHENSIVE METABOLIC PANEL
ALT: 15 IU/L (ref 0–32)
AST: 28 IU/L (ref 0–40)
Albumin/Globulin Ratio: 1.1 — ABNORMAL LOW (ref 1.2–2.2)
Albumin: 4.2 g/dL (ref 3.8–4.8)
Alkaline Phosphatase: 109 IU/L (ref 39–117)
BUN/Creatinine Ratio: 12 (ref 12–28)
BUN: 9 mg/dL (ref 8–27)
Bilirubin Total: 0.4 mg/dL (ref 0.0–1.2)
CO2: 25 mmol/L (ref 20–29)
Calcium: 10.1 mg/dL (ref 8.7–10.3)
Chloride: 93 mmol/L — ABNORMAL LOW (ref 96–106)
Creatinine, Ser: 0.77 mg/dL (ref 0.57–1.00)
GFR calc Af Amer: 94 mL/min/{1.73_m2} (ref >59)
GFR calc non Af Amer: 81 mL/min/{1.73_m2} (ref >59)
Globulin, Total: 4 g/dL (ref 1.5–4.5)
Glucose: 307 mg/dL — ABNORMAL HIGH (ref 65–99)
Potassium: 4.7 mmol/L (ref 3.5–5.2)
Sodium: 132 mmol/L — ABNORMAL LOW (ref 134–144)
Total Protein: 8.2 g/dL (ref 6.0–8.5)

## 2020-04-04 LAB — LIPID PANEL
Chol/HDL Ratio: 3.9 ratio (ref 0.0–4.4)
Cholesterol, Total: 189 mg/dL (ref 100–199)
HDL: 49 mg/dL (ref >39)
LDL Chol Calc (NIH): 98 mg/dL (ref 0–99)
Triglycerides: 248 mg/dL — ABNORMAL HIGH (ref 0–149)
VLDL Cholesterol Cal: 42 mg/dL — ABNORMAL HIGH (ref 5–40)

## 2020-04-04 LAB — HEMOGLOBIN A1C
Est. average glucose Bld gHb Est-mCnc: 292 mg/dL
Hgb A1c MFr Bld: 11.8 % — ABNORMAL HIGH (ref 4.8–5.6)

## 2020-04-04 LAB — TSH: TSH: 0.732 u[IU]/mL (ref 0.450–4.500)

## 2020-04-04 LAB — VITAMIN D 25 HYDROXY (VIT D DEFICIENCY, FRACTURES): Vit D, 25-Hydroxy: 10.9 ng/mL — ABNORMAL LOW (ref 30.0–100.0)

## 2020-04-09 ENCOUNTER — Inpatient Hospital Stay: Admission: RE | Admit: 2020-04-09 | Payer: Self-pay | Source: Ambulatory Visit

## 2020-04-10 DIAGNOSIS — L821 Other seborrheic keratosis: Secondary | ICD-10-CM | POA: Diagnosis not present

## 2020-04-10 DIAGNOSIS — L218 Other seborrheic dermatitis: Secondary | ICD-10-CM | POA: Diagnosis not present

## 2020-04-10 DIAGNOSIS — L280 Lichen simplex chronicus: Secondary | ICD-10-CM | POA: Diagnosis not present

## 2020-04-17 ENCOUNTER — Other Ambulatory Visit: Payer: Self-pay

## 2020-04-17 ENCOUNTER — Ambulatory Visit
Admission: RE | Admit: 2020-04-17 | Discharge: 2020-04-17 | Disposition: A | Discharge status: Home / Self Care | Payer: Medicare Other | Source: Ambulatory Visit | Attending: Family Medicine | Admitting: Family Medicine

## 2020-04-17 DIAGNOSIS — Z1231 Encounter for screening mammogram for malignant neoplasm of breast: Secondary | ICD-10-CM | POA: Diagnosis not present

## 2020-04-18 ENCOUNTER — Ambulatory Visit (INDEPENDENT_AMBULATORY_CARE_PROVIDER_SITE_OTHER): Payer: Medicare Other | Admitting: Family Medicine

## 2020-04-18 ENCOUNTER — Encounter: Payer: Self-pay | Admitting: Family Medicine

## 2020-04-18 ENCOUNTER — Ambulatory Visit (INDEPENDENT_AMBULATORY_CARE_PROVIDER_SITE_OTHER): Payer: Medicare Other

## 2020-04-18 ENCOUNTER — Other Ambulatory Visit: Payer: Self-pay

## 2020-04-18 VITALS — BP 132/83 | HR 79 | Temp 98.6°F | Ht 60.0 in | Wt 166.2 lb

## 2020-04-18 DIAGNOSIS — R079 Chest pain, unspecified: Secondary | ICD-10-CM | POA: Diagnosis not present

## 2020-04-18 DIAGNOSIS — F418 Other specified anxiety disorders: Secondary | ICD-10-CM | POA: Diagnosis not present

## 2020-04-18 DIAGNOSIS — I447 Left bundle-branch block, unspecified: Secondary | ICD-10-CM

## 2020-04-18 DIAGNOSIS — R7989 Other specified abnormal findings of blood chemistry: Secondary | ICD-10-CM | POA: Diagnosis not present

## 2020-04-18 DIAGNOSIS — R5383 Other fatigue: Secondary | ICD-10-CM

## 2020-04-18 DIAGNOSIS — E1165 Type 2 diabetes mellitus with hyperglycemia: Secondary | ICD-10-CM | POA: Diagnosis not present

## 2020-04-18 DIAGNOSIS — Z720 Tobacco use: Secondary | ICD-10-CM

## 2020-04-18 MED ORDER — DEXCOM G6 RECEIVER DEVI: 1.0000 | Freq: Every morning | 0 refills | Status: DC

## 2020-04-18 MED ORDER — TRULICITY 1.5 MG/0.5ML ~~LOC~~ SOAJ: 1.5000 mg | SUBCUTANEOUS | 1 refills | Status: DC

## 2020-04-18 MED ORDER — VITAMIN D (ERGOCALCIFEROL) 1.25 MG (50000 UNIT) PO CAPS: 50000.0000 [IU] | ORAL_CAPSULE | ORAL | 0 refills | Status: DC

## 2020-04-18 MED ORDER — SITAGLIPTIN PHOSPHATE 100 MG PO TABS: 100.0000 mg | ORAL_TABLET | Freq: Every day | ORAL | 1 refills | Status: DC

## 2020-04-18 NOTE — Patient Instructions (Addendum)
I do recommend meeting with counseling when able.   For diabetes: Increase Januvia to 100mg  per day, same dose glipizide, and increase trulicity dose (can try different site to see if less sore). Decrease soda and see other info on diet changes to see if these changes will get your numbers closer. Recheck with fructosamine blood test in 6 weeks.    Start vitamin D supplement once per week, repeat test in 6 weeks. That may also help with fatigue.   I will refer you back to Dr. to discuss the chest pains. Call 911 or go to the nearest emergency room if any of your symptoms worsen.    Nonspecific Chest Pain, Adult Chest pain can be caused by many different conditions. It can be caused by a condition that is life-threatening and requires treatment right away. It can also be caused by something that is not life-threatening. If you have chest pain, it can be hard to know the difference, so it is important to get help right away to make sure that you do not have a serious condition. Some life-threatening causes of chest pain include:  Heart attack.  A tear in the body's main blood vessel (aortic dissection).  Inflammation around your heart (pericarditis).  A problem in the lungs, such as a blood clot (pulmonary embolism) or a collapsed lung (pneumothorax). Some non life-threatening causes of chest pain include:  Heartburn.  Anxiety or stress.  Damage to the bones, muscles, and cartilage that make up your chest wall.  Pneumonia or bronchitis.  Shingles infection (varicella-zoster virus). Chest pain can feel like:  Pain or discomfort on the surface of your chest or deep in your chest.  Crushing, pressure, aching, or squeezing pain.  Burning or tingling.  Dull or sharp pain that is worse when you move, cough, or take a deep breath.  Pain or discomfort that is also felt in your back, neck, jaw, shoulder, or arm, or pain that spreads to any of these areas. Your chest pain may  come and go. It may also be constant. Your health care provider will do lab tests and other studies to find the cause of your pain. Treatment will depend on the cause of your chest pain. Follow these instructions at home: Medicines  Take over-the-counter and prescription medicines only as told by your health care provider.  If you were prescribed an antibiotic, take it as told by your health care provider. Do not stop taking the antibiotic even if you start to feel better. Lifestyle   Rest as directed by your health care provider.  Do not use any products that contain nicotine or tobacco, such as cigarettes and e-cigarettes. If you need help quitting, ask your health care provider.  Do not drink alcohol.  Make healthy lifestyle choices as recommended. These may include: ? Getting regular exercise. Ask your health care provider to suggest some activities that are safe for you. ? Eating a heart-healthy diet. This includes plenty of fresh fruits and vegetables, whole grains, low-fat (lean) protein, and low-fat dairy products. A dietitian can help you find healthy eating options. ? Maintaining a healthy weight. ? Managing any other health conditions you have, such as high blood pressure (hypertension) or diabetes. ? Reducing stress, such as with yoga or relaxation techniques. General instructions  Pay attention to any changes in your symptoms. Tell your health care provider about them or any new symptoms.  Avoid any activities that cause chest pain.  Keep all follow-up visits as  told by your health care provider. This is important. This includes visits for any further testing if your chest pain does not go away. Contact a health care provider if:  Your chest pain does not go away.  You feel depressed.  You have a fever. Get help right away if:  Your chest pain gets worse.  You have a cough that gets worse, or you cough up blood.  You have severe pain in your abdomen.  You  faint.  You have sudden, unexplained chest discomfort.  You have sudden, unexplained discomfort in your arms, back, neck, or jaw.  You have shortness of breath at any time.  You suddenly start to sweat, or your skin gets clammy.  You feel nausea or you vomit.  You suddenly feel lightheaded or dizzy.  You have severe weakness, or unexplained weakness or fatigue.  Your heart begins to beat quickly, or it feels like it is skipping beats. These symptoms may represent a serious problem that is an emergency. Do not wait to see if the symptoms will go away. Get medical help right away. Call your local emergency services (911 in the [place] Do not drive yourself to the hospital. Summary  Chest pain can be caused by a condition that is serious and requires urgent treatment. It may also be caused by something that is not life-threatening.  If you have chest pain, it is very important to see your health care provider. Your health care provider may do lab tests and other studies to find the cause of your pain.  Follow your health care provider's instructions on taking medicines, making lifestyle changes, and getting emergency treatment if symptoms become worse.  Keep all follow-up visits as told by your health care provider. This includes visits for any further testing if your chest pain does not go away. This information is not intended to replace advice given to you by your health care provider. Make sure you discuss any questions you have with your health care provider. Document Revised: 05/19/2018 Document Reviewed: 05/19/2018 Elsevier Patient Education  2020 [employer].  Diabetes Mellitus and Nutrition, Adult When you have diabetes (diabetes mellitus), it is very important to have healthy eating habits because your blood sugar (glucose) levels are greatly affected by what you eat and drink. Eating healthy foods in the appropriate amounts, at about the same times every day, can help  you:  Control your blood glucose.  Lower your risk of heart disease.  Improve your blood pressure.  Reach or maintain a healthy weight. Every person with diabetes is different, and each person has different needs for a meal plan. Your health care provider may recommend that you work with a diet and nutrition specialist (dietitian) to make a meal plan that is best for you. Your meal plan may vary depending on factors such as:  The calories you need.  The medicines you take.  Your weight.  Your blood glucose, blood pressure, and cholesterol levels.  Your activity level.  Other health conditions you have, such as heart or kidney disease. How do carbohydrates affect me? Carbohydrates, also called carbs, affect your blood glucose level more than any other type of food. Eating carbs naturally raises the amount of glucose in your blood. Carb counting is a method for keeping track of how many carbs you eat. Counting carbs is important to keep your blood glucose at a healthy level, especially if you use insulin or take certain oral diabetes medicines. It is important to know  how many carbs you can safely have in each meal. This is different for every person. Your dietitian can help you calculate how many carbs you should have at each meal and for each snack. Foods that contain carbs include:  Bread, cereal, rice, pasta, and crackers.  Potatoes and corn.  Peas, beans, and lentils.  Milk and yogurt.  Fruit and juice.  Desserts, such as cakes, cookies, ice cream, and candy. How does alcohol affect me? Alcohol can cause a sudden decrease in blood glucose (hypoglycemia), especially if you use insulin or take certain oral diabetes medicines. Hypoglycemia can be a life-threatening condition. Symptoms of hypoglycemia (sleepiness, dizziness, and confusion) are similar to symptoms of having too much alcohol. If your health care provider says that alcohol is safe for you, follow these  guidelines:  Limit alcohol intake to no more than 1 drink per day for nonpregnant women and 2 drinks per day for men. One drink equals 12 oz of beer, 5 oz of wine, or 1 oz of hard liquor.  Do not drink on an empty stomach.  Keep yourself hydrated with water, diet soda, or unsweetened iced tea.  Keep in mind that regular soda, juice, and other mixers may contain a lot of sugar and must be counted as carbs. What are tips for following this plan?  Reading food labels  Start by checking the serving size on the "Nutrition Facts" label of packaged foods and drinks. The amount of calories, carbs, fats, and other nutrients listed on the label is based on one serving of the item. Many items contain more than one serving per package.  Check the total grams (g) of carbs in one serving. You can calculate the number of servings of carbs in one serving by dividing the total carbs by 15. For example, if a food has 30 g of total carbs, it would be equal to 2 servings of carbs.  Check the number of grams (g) of saturated and trans fats in one serving. Choose foods that have low or no amount of these fats.  Check the number of milligrams (mg) of salt (sodium) in one serving. Most people should limit total sodium intake to less than 2,300 mg per day.  Always check the nutrition information of foods labeled as "low-fat" or "nonfat". These foods may be higher in added sugar or refined carbs and should be avoided.  Talk to your dietitian to identify your daily goals for nutrients listed on the label. Shopping  Avoid buying canned, premade, or processed foods. These foods tend to be high in fat, sodium, and added sugar.  Shop around the outside edge of the grocery store. This includes fresh fruits and vegetables, bulk grains, fresh meats, and fresh dairy. Cooking  Use low-heat cooking methods, such as baking, instead of high-heat cooking methods like deep frying.  Cook using healthy oils, such as olive,  canola, or sunflower oil.  Avoid cooking with butter, cream, or high-fat meats. Meal planning  Eat meals and snacks regularly, preferably at the same times every day. Avoid going long periods of time without eating.  Eat foods high in fiber, such as fresh fruits, vegetables, beans, and whole grains. Talk to your dietitian about how many servings of carbs you can eat at each meal.  Eat 4-6 ounces (oz) of lean protein each day, such as lean meat, chicken, fish, eggs, or tofu. One oz of lean protein is equal to: ? 1 oz of meat, chicken, or fish. ? 1 egg. ?  cup of tofu.  Eat some foods each day that contain healthy fats, such as avocado, nuts, seeds, and fish. Lifestyle  Check your blood glucose regularly.  Exercise regularly as told by your health care provider. This may include: ? 150 minutes of moderate-intensity or vigorous-intensity exercise each week. This could be brisk walking, biking, or water aerobics. ? Stretching and doing strength exercises, such as yoga or weightlifting, at least 2 times a week.  Take medicines as told by your health care provider.  Do not use any products that contain nicotine or tobacco, such as cigarettes and e-cigarettes. If you need help quitting, ask your health care provider.  Work with a Veterinary surgeon or diabetes educator to identify strategies to manage stress and any emotional and social challenges. Questions to ask a health care provider  Do I need to meet with a diabetes educator?  Do I need to meet with a dietitian?  What number can I call if I have questions?  When are the best times to check my blood glucose? Where to find more information:  American Diabetes Association: diabetes.org  Academy of Nutrition and Dietetics: www.eatright.[employer] of Diabetes and Digestive and Kidney Diseases (NIH): [employer].tn Summary  A healthy meal plan will help you control your blood glucose and maintain a healthy  lifestyle.  Working with a diet and nutrition specialist (dietitian) can help you make a meal plan that is best for you.  Keep in mind that carbohydrates (carbs) and alcohol have immediate effects on your blood glucose levels. It is important to count carbs and to use alcohol carefully. This information is not intended to replace advice given to you by your health care provider. Make sure you discuss any questions you have with your health care provider. Document Revised: 10/29/2017 Document Reviewed: 12/21/2016 Elsevier Patient Education  [employer].      If you have lab work done today you will be contacted with your lab results within the next 2 weeks.  If you have not heard from [place] then please contact us. The fastest way to get your results is to register for My Chart.   IF you received an x-ray today, you will receive an invoice from [facility]. Please contact [facility] at [phone] with questions or concerns regarding your invoice.   IF you received labwork today, you will receive an invoice from [place]. Please contact [facility] at [phone] with questions or concerns regarding your invoice.   Our billing staff will not be able to assist you with questions regarding bills from these companies.  You will be contacted with the lab results as soon as they are available. The fastest way to get your results is to activate your My Chart account. Instructions are located on the last page of this paperwork. If you have not heard from [place] regarding the results in 2 weeks, please contact this office.

## 2020-04-18 NOTE — Progress Notes (Addendum)
Subjective:  Patient ID: Felicia Hanson, female    DOB: 04-28-1956  Age: 64 y.o. MRN: [id number]  CC:  Chief Complaint  Patient presents with  . Follow-up    dm, hairloss, was told by derm that it is her nerves, still feeling fatigue. Wants to discuss labs    HPI [name] presents for  Follow-up May 5 visit.  Hair loss depression/anxiety:  Seen by dermatology, thought to be hair loss due to anxiety. Also given med for scalp.  We did discuss formal counseling and phone numbers were provided for marital stress.  Continued follow-up with her psychiatrist regarding medications, also provided phone number to family services of the [place] if needed but denied safety concerns. Has not met with therapist yet, but plans to schedule.  Lab Results  Component Value Date   TSH 0.732 04/03/2020   Diabetes: Uncontrolled with hyperglycemia. Previously had improved and controlled in November last year at 8.6, A1c worsened to 11.8 last visit.  She was taking Januvia 50 mg daily, glipizide 10 mg daily, Trulicity 4.19 mg weekly.  No symptomatic lows, was compliant with medications last visit. Sore with trulicity but taking  She is on statin.  Drinking soda. Has been trying to drink more water. More liquid foods past month after dental surgery.  Readings 200-300 random. No lows. Lowest fasting 120-159, 170's.  Rare nausea in past. No vomiting.   Lab Results  Component Value Date   HGBA1C 11.8 (H) 04/03/2020   HGBA1C 8.6 (A) 10/18/2019   HGBA1C 14.0 (A) 06/26/2019   Lab Results  Component Value Date   LDLCALC 98 04/03/2020   CREATININE 0.77 04/03/2020   Low serum vitamin D: Low reading 10.9 on May 5.  Reported previous prescription treatment but not recently.  Does admit to having some fatigue as above.  Chest pains: Noticed few times in past few weeks with lying down.  None with activity. Slight associated shortness of breath at time. Slight nausea before CP.   Middle of chest  to left side of chest pain. No radiation to arm. Sharp pain. Lasted few seconds. No associated belching or heartburn at the time. Hx of GERD. pepcid AC as needed.  No hx of MI, PTCA.  eval by Dr. Einar Gip in 2016.  stress test at that time ( I am unable to see results.).  Told she had ok stress test - follow up as needed.  Cardiac RF: diabetes, smoking (3/4- 1ppd). age.    History Patient Active Problem List   Diagnosis Date Noted  . Type 2 diabetes mellitus without complication, without long-term current use of insulin ([place]) 10/26/2017  . Generalized anxiety disorder 01/10/2014  . Vitamin D deficiency 01/10/2014  . Essential hypertension, benign 01/10/2014  . Pure hypercholesterolemia 01/10/2014  . Hypothyroidism 01/10/2014   Past Medical History:  Diagnosis Date  . Anxiety   . Depression   . Diabetes mellitus without complication ([place])   . Hyperlipidemia   . Hypertension   . Thyroid disease    Past Surgical History:  Procedure Laterality Date  . BREAST EXCISIONAL BIOPSY Left   . CHOLECYSTECTOMY    . COLON SURGERY     diverticulitis with perforation; s/p colon resection.  . Colonoscopy    . TUBAL LIGATION     Allergies  Allergen Reactions  . Metformin And Related Other (See Comments)    Could not swallow   Prior to Admission medications   Medication Sig Start Date End Date Taking? Authorizing  Provider  ACCU-CHEK AVIVA PLUS test strip daily. for testing 03/11/18  Yes [provider]  ALPRAZolam Duanne Moron) 1 MG tablet Take 0.5 mg by mouth 3 (three) times daily as needed for anxiety or sleep.  08/21/13  Yes [provider]  amphetamine-dextroamphetamine (ADDERALL) 20 MG tablet Reported on 11/26/2015 06/14/15  Yes [provider]  aspirin 81 MG tablet Take 81 mg by mouth daily.   Yes [provider]  betamethasone dipropionate 0.05 % lotion Apply 1 application topically 2 (two) times daily. 04/10/20  Yes [provider]  cetirizine  (ZYRTEC) 10 MG tablet Take 1 tablet (10 mg total) by mouth daily. 10/18/19  Yes Stallings, Zoe A, MD  Dulaglutide (TRULICITY) 8.25 KN/3.9JQ SOPN Inject 0.75 mg into the skin once a week. 03/11/20  Yes Stallings, Zoe A, MD  DULoxetine (CYMBALTA) 60 MG capsule TAKE 2 CAPSULES BY MOUTH DAILY 03/06/20  Yes Stallings, Zoe A, MD  glipiZIDE (GLUCOTROL XL) 10 MG 24 hr tablet TAKE 1 TABLET (10 MG TOTAL) BY MOUTH DAILY WITH BREAKFAST. 01/16/20  Yes Forrest Moron, MD  HYDROcodone-acetaminophen (NORCO/VICODIN) 5-325 MG tablet Take 1 tablet by mouth every 8 (eight) hours as needed. 01/30/20  Yes [provider]  JANUVIA 50 MG tablet TAKE 1 TABLET BY MOUTH EVERY DAY 12/18/19  Yes Stallings, Zoe A, MD  levothyroxine (SYNTHROID) 88 MCG tablet TAKE 1 TABLET (88 MCG TOTAL) BY MOUTH DAILY BEFORE BREAKFAST. 02/07/20  Yes Stallings, Zoe A, MD  lisinopril (ZESTRIL) 2.5 MG tablet TAKE 1 TABLET BY MOUTH EVERY DAY 12/18/19  Yes Stallings, Zoe A, MD  meloxicam (MOBIC) 7.5 MG tablet Take 1 tablet (7.5 mg total) by mouth daily. 11/10/19  Yes Yu, Amy V, PA-C  metoprolol tartrate (LOPRESSOR) 50 MG tablet TAKE 1 TABLET BY MOUTH TWICE A DAY 03/14/20  Yes Stallings, Zoe A, MD  ondansetron (ZOFRAN ODT) 4 MG disintegrating tablet Take 1 tablet (4 mg total) by mouth every 8 (eight) hours as needed for nausea or vomiting. 07/14/19  Yes Stallings, Zoe A, MD  pravastatin (PRAVACHOL) 40 MG tablet TAKE 1 TABLET BY MOUTH EVERY DAY 03/14/20  Yes Stallings, Zoe A, MD  triamcinolone cream (KENALOG) 0.5 % Apply 1 application topically 3 (three) times daily. Apply to back and forearms, avoid the face 10/18/19  Yes Forrest Moron, MD   Social History   Socioeconomic History  . Marital status: Married    Spouse name: Not on file  . Number of children: Not on file  . Years of education: Not on file  . Highest education level: Not on file  Occupational History  . Not on file  Tobacco Use  . Smoking status: Current Every Day Smoker     Packs/day: 0.75    Years: 38.00    Pack years: 28.50    Types: Cigarettes  . Smokeless tobacco: Never Used  Substance and Sexual Activity  . Alcohol use: No    Alcohol/week: 0.0 standard drinks  . Drug use: No  . Sexual activity: Yes  Other Topics Concern  . 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 Difficulty of  Paying Living Expenses:   Food Insecurity:   . Worried About Charity fundraiser in the Last Year:   . Arboriculturist in the Last Year:   Transportation Needs:   . Film/video editor (Medical):   Marland Kitchen Lack of Transportation (Non-Medical):   Physical Activity:   . Days of Exercise per Week:   . Minutes of Exercise per Session:   Stress:   . Feeling of Stress :   Social Connections:   . Frequency of Communication with Friends and Family:   . Frequency of Social Gatherings with Friends and Family:   . Attends Religious Services:   . Active Member of Clubs or Organizations:   . Attends Archivist Meetings:   Marland Kitchen Marital Status:   Intimate Partner Violence:   . Fear of Current or Ex-Partner:   . Emotionally Abused:   Marland Kitchen Physically Abused:   . Sexually Abused:     Review of Systems  Constitutional: Positive for fatigue.  Respiratory: Negative for shortness of breath.   Cardiovascular: Positive for chest pain. Negative for leg swelling.  other per hpi   Objective:   Vitals:   04/18/20 1535  BP: 132/83  Pulse: 79  Temp: 98.6 F (37 C)  SpO2: 95%  Weight: 166 lb 3.2 oz (75.4 kg)  Height: 5' (1.524 m)     Physical Exam Vitals reviewed.  Constitutional:      Appearance: She is well-developed.  HENT:     Head: Normocephalic and atraumatic.  Eyes:     Conjunctiva/sclera: Conjunctivae  normal.     Pupils: Pupils are equal, round, and reactive to light.  Neck:     Vascular: No carotid bruit.  Cardiovascular:     Rate and Rhythm: Normal rate and regular rhythm.     Heart sounds: Normal heart sounds.  Pulmonary:     Effort: Pulmonary effort is normal.     Breath sounds: Normal breath sounds.  Abdominal:     Palpations: Abdomen is soft. There is no pulsatile mass.     Tenderness: There is no abdominal tenderness.  Musculoskeletal:     Right lower leg: No edema.     Left lower leg: No edema.  Skin:    General: Skin is warm and dry.  Neurological:     Mental Status: She is alert and oriented to person, place, and time.  Psychiatric:        Behavior: Behavior normal.    EKG: sinus rhythm, rate 78, new left bundle branch block compared to EKG in 2017.  DG Chest 2 View  Result Date: 04/18/2020 CLINICAL DATA:  Intermittent chest pain. Smoker. EXAM: CHEST - 2 VIEW COMPARISON:  08/29/2019 FINDINGS: The cardiomediastinal contours are unchanged, stable upper normal heart size. Mild central bronchial thickening consistent with smoking related lung disease. Pulmonary vasculature is normal. No consolidation, pleural effusion, or pneumothorax. No acute osseous abnormalities are seen. IMPRESSION: Mild central bronchial thickening consistent with history of smoking. No acute findings. Electronically Signed   By: Keith Rake M.D.   On: 04/18/2020 16:37     Assessment & Plan:  KEELIA GRAYBILL is a 64 y.o. female . Low serum vitamin D - Plan: Vitamin D, Ergocalciferol, (DRISDOL) 1.25 MG (50000 UNIT) CAPS capsule  -Restart supplementation with 50,000 units weekly.  Recheck level 6 weeks  Uncontrolled type 2 diabetes mellitus with hyperglycemia (HCC) - Plan: sitaGLIPtin (JANUVIA) 100 MG tablet, Dulaglutide (TRULICITY) 1.5 WF/0.9NA SOPN, Microalbumin / creatinine urine ratio,  Continuous Blood Gluc Receiver ([place]) [place]  -Plans on improved diet, will also increase  Januvia to 100 mg, increase Trulicity to 1.5 mg/week.  Continue glipizide same dose for now.  Hypoglycemia precautions, continue close monitoring.  Discussed potential need for insulin if not significantly improving with current med changes.  Fatigue, unspecified type  -Possibly multifactorial with uncontrolled diabetes, low vitamin D, but also with prior chest pains and abnormal EKG.  Will refer to cardiology as below.  ER/911 precautions given.  Depression with anxiety  -Follow-up with counseling planned.  Chest pain, unspecified type - Plan: EKG 12-Lead, DG Chest 2 View, Ambulatory referral to Cardiology, CANCELED: Ambulatory referral to Cardiology Current nicotine use Left bundle branch block (LBBB)  -Few fleeting symptoms of chest pain as above, asymptomatic at present.  Unfortunately does have some EKG changes with a new left bundle branch block compared to previous EKG in 2017.  Cardiac risk factors of uncontrolled diabetes, smoking.  As currently asymptomatic will refer for urgent referral to cardiology.  ER/911 precautions were given if any return of chest pain or new symptoms.  Understanding was expressed.  Meds ordered this encounter  Medications  . sitaGLIPtin (JANUVIA) 100 MG tablet    Sig: Take 1 tablet (100 mg total) by mouth daily.    Dispense:  90 tablet    Refill:  1  . Dulaglutide (TRULICITY) 1.5 WR/6.0AV SOPN    Sig: Inject 1.5 mg into the skin once a week.    Dispense:  6 mL    Refill:  1  . Vitamin D, Ergocalciferol, (DRISDOL) 1.25 MG (50000 UNIT) CAPS capsule    Sig: Take 1 capsule (50,000 Units total) by mouth every 7 (seven) days.    Dispense:  15 capsule    Refill:  0  . Continuous Blood Gluc Receiver (DEXCOM G6 RECEIVER) [place]    Sig: 1 Device by Does not apply route every morning. Device and supplies    Dispense:  1 each    Refill:  0    DX E:11.9 check glucose 3 time daily  May change to [facility] if needed for coverage. Please advise patient of  cost.   Patient Instructions    I do recommend meeting with counseling when able.   For diabetes: Increase Januvia to '100mg'$  per day, same dose glipizide, and increase trulicity dose (can try different site to see if less sore). Decrease soda and see other info on diet changes to see if these changes will get your numbers closer. Recheck with fructosamine blood test in 6 weeks.    Start vitamin D supplement once per week, repeat test in 6 weeks. That may also help with fatigue.   I will refer you back to Dr. Einar Gip to discuss the chest pains. Call 911 or go to the nearest emergency room if any of your symptoms worsen.    Nonspecific Chest Pain, Adult Chest pain can be caused by many different conditions. It can be caused by a condition that is life-threatening and requires treatment right away. It can also be caused by something that is not life-threatening. If you have chest pain, it can be hard to know the difference, so it is important to get help right away to make sure that you do not have a serious condition. Some life-threatening causes of chest pain include:  Heart attack.  A tear in the body's main blood vessel (aortic dissection).  Inflammation around your heart (pericarditis).  A problem in the lungs,  such as a blood clot (pulmonary embolism) or a collapsed lung (pneumothorax). Some non life-threatening causes of chest pain include:  Heartburn.  Anxiety or stress.  Damage to the bones, muscles, and cartilage that make up your chest wall.  Pneumonia or bronchitis.  Shingles infection (varicella-zoster virus). Chest pain can feel like:  Pain or discomfort on the surface of your chest or deep in your chest.  Crushing, pressure, aching, or squeezing pain.  Burning or tingling.  Dull or sharp pain that is worse when you move, cough, or take a deep breath.  Pain or discomfort that is also felt in your back, neck, jaw, shoulder, or arm, or pain that spreads to any of  these areas. Your chest pain may come and go. It may also be constant. Your health care provider will do lab tests and other studies to find the cause of your pain. Treatment will depend on the cause of your chest pain. Follow these instructions at home: Medicines  Take over-the-counter and prescription medicines only as told by your health care provider.  If you were prescribed an antibiotic, take it as told by your health care provider. Do not stop taking the antibiotic even if you start to feel better. Lifestyle   Rest as directed by your health care provider.  Do not use any products that contain nicotine or tobacco, such as cigarettes and e-cigarettes. If you need help quitting, ask your health care provider.  Do not drink alcohol.  Make healthy lifestyle choices as recommended. These may include: ? Getting regular exercise. Ask your health care provider to suggest some activities that are safe for you. ? Eating a heart-healthy diet. This includes plenty of fresh fruits and vegetables, whole grains, low-fat (lean) protein, and low-fat dairy products. A dietitian can help you find healthy eating options. ? Maintaining a healthy weight. ? Managing any other health conditions you have, such as high blood pressure (hypertension) or diabetes. ? Reducing stress, such as with yoga or relaxation techniques. General instructions  Pay attention to any changes in your symptoms. Tell your health care provider about them or any new symptoms.  Avoid any activities that cause chest pain.  Keep all follow-up visits as told by your health care provider. This is important. This includes visits for any further testing if your chest pain does not go away. Contact a health care provider if:  Your chest pain does not go away.  You feel depressed.  You have a fever. Get help right away if:  Your chest pain gets worse.  You have a cough that gets worse, or you cough up blood.  You have severe  pain in your abdomen.  You faint.  You have sudden, unexplained chest discomfort.  You have sudden, unexplained discomfort in your arms, back, neck, or jaw.  You have shortness of breath at any time.  You suddenly start to sweat, or your skin gets clammy.  You feel nausea or you vomit.  You suddenly feel lightheaded or dizzy.  You have severe weakness, or unexplained weakness or fatigue.  Your heart begins to beat quickly, or it feels like it is skipping beats. These symptoms may represent a serious problem that is an emergency. Do not wait to see if the symptoms will go away. Get medical help right away. Call your local emergency services (911 in the [place] Do not drive yourself to the hospital. Summary  Chest pain can be caused by a condition that is serious and requires  urgent treatment. It may also be caused by something that is not life-threatening.  If you have chest pain, it is very important to see your health care provider. Your health care provider may do lab tests and other studies to find the cause of your pain.  Follow your health care provider's instructions on taking medicines, making lifestyle changes, and getting emergency treatment if symptoms become worse.  Keep all follow-up visits as told by your health care provider. This includes visits for any further testing if your chest pain does not go away. This information is not intended to replace advice given to you by your health care provider. Make sure you discuss any questions you have with your health care provider. Document Revised: 05/19/2018 Document Reviewed: 05/19/2018 Elsevier Patient Education  [place].  Diabetes Mellitus and Nutrition, Adult When you have diabetes (diabetes mellitus), it is very important to have healthy eating habits because your blood sugar (glucose) levels are greatly affected by what you eat and drink. Eating healthy foods in the appropriate amounts, at about the same  times every day, can help you:  Control your blood glucose.  Lower your risk of heart disease.  Improve your blood pressure.  Reach or maintain a healthy weight. Every person with diabetes is different, and each person has different needs for a meal plan. Your health care provider may recommend that you work with a diet and nutrition specialist (dietitian) to make a meal plan that is best for you. Your meal plan may vary depending on factors such as:  The calories you need.  The medicines you take.  Your weight.  Your blood glucose, blood pressure, and cholesterol levels.  Your activity level.  Other health conditions you have, such as heart or kidney disease. How do carbohydrates affect me? Carbohydrates, also called carbs, affect your blood glucose level more than any other type of food. Eating carbs naturally raises the amount of glucose in your blood. Carb counting is a method for keeping track of how many carbs you eat. Counting carbs is important to keep your blood glucose at a healthy level, especially if you use insulin or take certain oral diabetes medicines. It is important to know how many carbs you can safely have in each meal. This is different for every person. Your dietitian can help you calculate how many carbs you should have at each meal and for each snack. Foods that contain carbs include:  Bread, cereal, rice, pasta, and crackers.  Potatoes and corn.  Peas, beans, and lentils.  Milk and yogurt.  Fruit and juice.  Desserts, such as cakes, cookies, ice cream, and candy. How does alcohol affect me? Alcohol can cause a sudden decrease in blood glucose (hypoglycemia), especially if you use insulin or take certain oral diabetes medicines. Hypoglycemia can be a life-threatening condition. Symptoms of hypoglycemia (sleepiness, dizziness, and confusion) are similar to symptoms of having too much alcohol. If your health care provider says that alcohol is safe for  you, follow these guidelines:  Limit alcohol intake to no more than 1 drink per day for nonpregnant women and 2 drinks per day for men. One drink equals 12 oz of beer, 5 oz of wine, or 1 oz of hard liquor.  Do not drink on an empty stomach.  Keep yourself hydrated with water, diet soda, or unsweetened iced tea.  Keep in mind that regular soda, juice, and other mixers may contain a lot of sugar and must be counted as carbs.  What are tips for following this plan?  Reading food labels  Start by checking the serving size on the "Nutrition Facts" label of packaged foods and drinks. The amount of calories, carbs, fats, and other nutrients listed on the label is based on one serving of the item. Many items contain more than one serving per package.  Check the total grams (g) of carbs in one serving. You can calculate the number of servings of carbs in one serving by dividing the total carbs by 15. For example, if a food has 30 g of total carbs, it would be equal to 2 servings of carbs.  Check the number of grams (g) of saturated and trans fats in one serving. Choose foods that have low or no amount of these fats.  Check the number of milligrams (mg) of salt (sodium) in one serving. Most people should limit total sodium intake to less than 2,300 mg per day.  Always check the nutrition information of foods labeled as "low-fat" or "nonfat". These foods may be higher in added sugar or refined carbs and should be avoided.  Talk to your dietitian to identify your daily goals for nutrients listed on the label. Shopping  Avoid buying canned, premade, or processed foods. These foods tend to be high in fat, sodium, and added sugar.  Shop around the outside edge of the grocery store. This includes fresh fruits and vegetables, bulk grains, fresh meats, and fresh dairy. Cooking  Use low-heat cooking methods, such as baking, instead of high-heat cooking methods like deep frying.  Cook using healthy  oils, such as olive, canola, or sunflower oil.  Avoid cooking with butter, cream, or high-fat meats. Meal planning  Eat meals and snacks regularly, preferably at the same times every day. Avoid going long periods of time without eating.  Eat foods high in fiber, such as fresh fruits, vegetables, beans, and whole grains. Talk to your dietitian about how many servings of carbs you can eat at each meal.  Eat 4-6 ounces (oz) of lean protein each day, such as lean meat, chicken, fish, eggs, or tofu. One oz of lean protein is equal to: ? 1 oz of meat, chicken, or fish. ? 1 egg. ?  cup of tofu.  Eat some foods each day that contain healthy fats, such as avocado, nuts, seeds, and fish. Lifestyle  Check your blood glucose regularly.  Exercise regularly as told by your health care provider. This may include: ? 150 minutes of moderate-intensity or vigorous-intensity exercise each week. This could be brisk walking, biking, or water aerobics. ? Stretching and doing strength exercises, such as yoga or weightlifting, at least 2 times a week.  Take medicines as told by your health care provider.  Do not use any products that contain nicotine or tobacco, such as cigarettes and e-cigarettes. If you need help quitting, ask your health care provider.  Work with a Social worker or diabetes educator to identify strategies to manage stress and any emotional and social challenges. Questions to ask a health care provider  Do I need to meet with a diabetes educator?  Do I need to meet with a dietitian?  What number can I call if I have questions?  When are the best times to check my blood glucose? Where to find more information:  American Diabetes Association: diabetes.org  Academy of Nutrition and Dietetics: www.eatright.[employer] of Diabetes and Digestive and Kidney Diseases (NIH): [employer] Summary  A healthy meal plan will help you control  your blood glucose and maintain a  healthy lifestyle.  Working with a diet and nutrition specialist (dietitian) can help you make a meal plan that is best for you.  Keep in mind that carbohydrates (carbs) and alcohol have immediate effects on your blood glucose levels. It is important to count carbs and to use alcohol carefully. This information is not intended to replace advice given to you by your health care provider. Make sure you discuss any questions you have with your health care provider. Document Revised: 10/29/2017 Document Reviewed: 12/21/2016 Elsevier Patient Education  [employer].      If you have lab work done today you will be contacted with your lab results within the next 2 weeks.  If you have not heard from [place] then please contact us. The fastest way to get your results is to register for My Chart.   IF you received an x-ray today, you will receive an invoice from [facility]. Please contact [facility] at [phone] with questions or concerns regarding your invoice.   IF you received labwork today, you will receive an invoice from [place]. Please contact [facility] at [phone] with questions or concerns regarding your invoice.   Our billing staff will not be able to assist you with questions regarding bills from these companies.  You will be contacted with the lab results as soon as they are available. The fastest way to get your results is to activate your My Chart account. Instructions are located on the last page of this paperwork. If you have not heard from [place] regarding the results in 2 weeks, please contact this office.         Signed, Merri Ray, MD [facility]

## 2020-04-19 LAB — MICROALBUMIN / CREATININE URINE RATIO
Creatinine, Urine: 123.1 mg/dL
Microalb/Creat Ratio: 107 mg/g creat — ABNORMAL HIGH (ref 0–29)
Microalbumin, Urine: 132.1 ug/mL

## 2020-04-21 NOTE — Progress Notes (Signed)
Date:  04/28/2020   ID:  Felicia Hanson, DOB [birth year], MRN [id number]  PCP:  Shade Flood, MD  Cardiologist:  Tessa Lerner, DO, [facility] (established care 04/22/2020)  REASON FOR CONSULT: Chest Pain.   REQUESTING PHYSICIAN:  Shade Flood, MD [address] [place] [address]   Chief Complaint  Patient presents with  . Chest Pain  . New Patient (Initial Visit)    HPI  Felicia Hanson is a 64 y.o. female who is being seen today for the evaluation of chest pain at the request of Shade Flood, MD. Patient's past medical history and cardiac risk factors include: uncontrolled Type 2 diabetes with hyperglycemia, pure hypercholesterolemia, hypertriglyceridemia, coronary artery calcification, newly diagnosed left bundle branch block, essential hypertension, hypothyroidism, tobacco use disorder, vitamin D deficiency, generalized anxiety disorder.   Patient is referred to the office at the request of her primary care provider for evaluation of chest pain.  Currently patient states that she does not have any chest pain or anginal equivalent.  Her last episode was 2 or 3 weeks ago.  She has had 2 episodes of chest discomfort located over the left sternal border, intensity 5 out of 10, last for a few seconds, no improving or worsening factors, not associated with effort related activities, self-limited, she is attributing the discomfort to gas pains.  Patient states that this could be secondary to the stress that she is currently going through.  The pain is not brought on by effort related activities nor does it improve with rest.  No family history of premature coronary disease or sudden cardiac death.  Denies prior history of coronary artery disease, myocardial infarction, congestive heart failure, deep venous thrombosis, pulmonary embolism, stroke, transient ischemic attack.  FUNCTIONAL STATUS: No structured exercise program or daily routine.  But does actively take care of her  grandkids.  ALLERGIES: Allergies  Allergen Reactions  . Metformin And Related Anaphylaxis    Could not swallow    MEDICATION LIST PRIOR TO VISIT: Current Meds  Medication Sig  . ACCU-CHEK AVIVA PLUS test strip daily. for testing  . ALPRAZolam (XANAX) 1 MG tablet Take 0.5 mg by mouth 3 (three) times daily as needed for anxiety or sleep.   Marland Kitchen aspirin 81 MG tablet Take 81 mg by mouth daily.  . betamethasone dipropionate 0.05 % lotion Apply 1 application topically 2 (two) times daily.  . cetirizine (ZYRTEC) 10 MG tablet Take 1 tablet (10 mg total) by mouth daily.  . Continuous Blood Gluc Receiver (DEXCOM G6 RECEIVER) DEVI 1 Device by Does not apply route every morning. Device and supplies  . Dulaglutide (TRULICITY) 1.5 MG/0.5ML SOPN Inject 1.5 mg into the skin once a week.  . DULoxetine (CYMBALTA) 60 MG capsule TAKE 2 CAPSULES BY MOUTH DAILY  . levothyroxine (SYNTHROID) 88 MCG tablet TAKE 1 TABLET (88 MCG TOTAL) BY MOUTH DAILY BEFORE BREAKFAST.  Marland Kitchen lisinopril (ZESTRIL) 2.5 MG tablet TAKE 1 TABLET BY MOUTH EVERY DAY  . meloxicam (MOBIC) 7.5 MG tablet Take 1 tablet (7.5 mg total) by mouth daily.  . metoprolol tartrate (LOPRESSOR) 50 MG tablet TAKE 1 TABLET BY MOUTH TWICE A DAY  . ondansetron (ZOFRAN ODT) 4 MG disintegrating tablet Take 1 tablet (4 mg total) by mouth every 8 (eight) hours as needed for nausea or vomiting.  . pravastatin (PRAVACHOL) 40 MG tablet TAKE 1 TABLET BY MOUTH EVERY DAY  . sitaGLIPtin (JANUVIA) 100 MG tablet Take 1 tablet (100 mg total) by mouth daily.  Marland Kitchen  triamcinolone cream (KENALOG) 0.5 % Apply 1 application topically 3 (three) times daily. Apply to back and forearms, avoid the face  . Vitamin D, Ergocalciferol, (DRISDOL) 1.25 MG (50000 UNIT) CAPS capsule Take 1 capsule (50,000 Units total) by mouth every 7 (seven) days.  . [DISCONTINUED] glipiZIDE (GLUCOTROL XL) 10 MG 24 hr tablet TAKE 1 TABLET (10 MG TOTAL) BY MOUTH DAILY WITH BREAKFAST.  . [DISCONTINUED]  HYDROcodone-acetaminophen (NORCO/VICODIN) 5-325 MG tablet Take 1 tablet by mouth every 8 (eight) hours as needed.   Current Facility-Administered Medications for the 04/22/20 encounter (Office Visit) with Tessa Lerner, DO  Medication  . cefTRIAXone (ROCEPHIN) injection 1 g     PAST MEDICAL HISTORY: Past Medical History:  Diagnosis Date  . Anxiety   . Coronary artery calcification   . Depression   . Diabetes mellitus without complication (HCC)   . Hyperlipidemia   . Hypertension   . Thyroid disease     PAST SURGICAL HISTORY: Past Surgical History:  Procedure Laterality Date  . BREAST EXCISIONAL BIOPSY Left   . CHOLECYSTECTOMY    . COLON SURGERY     diverticulitis with perforation; s/p colon resection.  . Colonoscopy    . TUBAL LIGATION      FAMILY HISTORY: The patient family history includes Breast cancer in her paternal aunt; Cancer in her mother; Hyperlipidemia in her mother.  SOCIAL HISTORY:  The patient  reports that she has been smoking cigarettes. She has a 28.50 pack-year smoking history. She has never used smokeless tobacco. She reports that she does not drink alcohol or use drugs.  REVIEW OF SYSTEMS: Review of Systems  Constitution: Negative for chills and fever.  HENT: Negative for hoarse voice and nosebleeds.   Eyes: Negative for discharge, double vision and pain.  Cardiovascular: Positive for chest pain and dyspnea on exertion. Negative for claudication, leg swelling, near-syncope, orthopnea, palpitations, paroxysmal nocturnal dyspnea and syncope.  Respiratory: Negative for hemoptysis and shortness of breath.   Musculoskeletal: Negative for muscle cramps and myalgias.  Gastrointestinal: Negative for abdominal pain, constipation, diarrhea, hematemesis, hematochezia, melena, nausea and vomiting.  Neurological: Negative for dizziness and light-headedness.    PHYSICAL EXAM: Vitals with BMI 04/22/2020 04/18/2020 04/03/2020  Height 5\' 0"  5\' 0"  5\' 0"   Weight 168 lbs  166 lbs 3 oz 166 lbs  BMI 32.81 32.46 32.42  Systolic 139 132  Diastolic 64 83 84  Pulse 81 79 87    CONSTITUTIONAL: Well-developed and well-nourished. No acute distress.  SKIN: Skin is warm and dry. No rash noted. No cyanosis. No pallor. No jaundice HEAD: Normocephalic and atraumatic.  EYES: No scleral icterus MOUTH/THROAT: Moist oral membranes.  NECK: No JVD present. No thyromegaly noted. No carotid bruits  LYMPHATIC: No visible cervical adenopathy.  CHEST Normal respiratory effort. No intercostal retractions  LUNGS: Clear to auscultation bilaterally.  No stridor. No wheezes. No rales.  CARDIOVASCULAR: Regular rate and rhythm, positive S1-S2, no murmurs rubs or gallops appreciated. ABDOMINAL: Obese, soft, nontender, nondistended, positive bowel sounds in all 4 quadrants.  No apparent ascites.  EXTREMITIES: No peripheral edema  HEMATOLOGIC: No significant bruising NEUROLOGIC: Oriented to person, place, and time. Nonfocal. Normal muscle tone.  PSYCHIATRIC: Normal mood and affect. Normal behavior. Cooperative  CARDIAC DATABASE:  EKG: 03/14/2015: Normal sinus rhythm at rate of 75 bpm, right atrial enlargement, leftward axis, left ventricular hypertrophy. Poor R-wave progression, cannot exclude anterior infarct old. 04/18/20: Sinus Rhythm. Left bundle branch block.  04/22/2020: Normal sinus rhythm, 77 bpm, left axis deviation, left  bundle branch block, left atrial enlargement, poor R wave progression.  Coronary calcium scoring 05/13/2015: LAD: 8, Cx: 2. Total calcium score is 10.   Echocardiogram: 03/2015: LVEF 44%, mild LVH, mild to moderate decrease in global wall motion, grade 2 diastolic impairment, mildly dilated left atrium, mild MR and TR.  Stress Testing: Lexiscan sestamibi stress test: 04/08/2015: Myocardial perfusion imaging is normal. Overall left ventricular systolic function was normal without regional wall motion abnormalities. The left ventricular ejection fraction  was 61%.  Heart Catheterization: None   LABORATORY DATA: CBC Latest Ref Rng & Units 02/03/2019 08/11/2018 02/11/2018  WBC 3.4 - 10.8 x10E3/uL 10.3 12.7(A) 11.0(H)  Hemoglobin 11.1 - 15.9 g/dL 94.3 27.6 14.7  Hematocrit 34.0 - 46.6 % 39.9 43.9 46.0  Platelets 150 - 450 x10E3/uL 217 - 268    CMP Latest Ref Rng & Units 04/03/2020 10/18/2019 06/26/2019  Glucose 65 - 99 mg/dL 092(H) 574(B) 340(Z)  BUN 8 - 27 mg/dL 9 5(L) 7(L)  Creatinine 0.57 - 1.00 mg/dL 7.09 6.43 8.38  Sodium 134 - 144 mmol/L 132(L) 138 135  Potassium 3.5 - 5.2 mmol/L 4.7 4.8 3.6  Chloride 96 - 106 mmol/L 93(L) 97 96  CO2 20 - 29 mmol/L 25 28 18(L)  Calcium 8.7 - 10.3 mg/dL 18.4 9.8 9.3  Total Protein 6.0 - 8.5 g/dL 8.2 7.8 7.3  Total Bilirubin 0.0 - 1.2 mg/dL 0.4 0.6 0.5  Alkaline Phos 39 - 117 IU/L 109 92 98  AST 0 - 40 IU/L 28 31 20   ALT 0 - 32 IU/L 15 12 10     Lipid Panel     Component Value Date/Time   CHOL 189 04/03/2020 1757   TRIG 248 (H) 04/03/2020 1757   HDL 49 04/03/2020 1757   CHOLHDL 3.9 04/03/2020 1757   CHOLHDL 5.0 08/09/2015 1504   VLDL 44 (H) 08/09/2015 1504   LDLCALC 98 04/03/2020 1757   LABVLDL 42 (H) 04/03/2020 1757   Lipid Panel Recent Labs    06/26/19 1217 10/18/19 1712 04/03/20 1757  CHOL 168 177 189  TRIG 161* 192* 248*  LDLCALC 88 97 98  HDL 48 47 49  CHOLHDL 3.5 3.8 3.9    Lab Results  Component Value Date   HGBA1C 11.8 (H) 04/03/2020   HGBA1C 8.6 (A) 10/18/2019   HGBA1C 14.0 (A) 06/26/2019   No components found for: NTPROBNP Lab Results  Component Value Date   TSH 0.732 04/03/2020   TSH CANCELED 06/26/2019   TSH 1.180 02/03/2019    BMP Recent Labs    06/26/19 1217 10/18/19 1712 04/03/20 1757  NA 135 138 132*  K 3.6 4.8 4.7  CL 96 97 93*  CO2 18* 28 25  GLUCOSE 269* 219* 307*  BUN 7* 5* 9  CREATININE 0.72 0.71 0.77  CALCIUM 9.3 9.8 10.1  GFRNONAA 89 90 81  GFRAA 102 104 94    IMPRESSION:    ICD-10-CM   1. Precordial chest pain  R07.2 EKG 12-Lead     PCV MYOCARDIAL PERFUSION WITH LEXISCAN    PCV ECHOCARDIOGRAM COMPLETE  2. Dyspnea on exertion  R06.00 PCV MYOCARDIAL PERFUSION WITH LEXISCAN    PCV ECHOCARDIOGRAM COMPLETE  3. LBBB (left bundle branch block)  I44.7 PCV MYOCARDIAL PERFUSION WITH LEXISCAN  4. Non-insulin dependent type 2 diabetes mellitus (HCC)  E11.9   5. Benign hypertension  I10   6. Hypercholesterolemia  E78.00   7. Hypothyroidism, unspecified type  E03.9   8. Cigarette smoker  F17.210  RECOMMENDATIONS: HILDUR BAYER is a 64 y.o. female whose past medical history and cardiac risk factors include: uncontrolled Type 2 diabetes with hyperglycemia, pure hypercholesterolemia, hypertriglyceridemia, coronary artery calcification, newly diagnosed left bundle branch block, essential hypertension, hypothyroidism, tobacco use disorder, vitamin D deficiency, generalized anxiety disorder.   Precordial chest pain and dyspnea on exertion: Patient symptoms of chest pain are atypical in nature; however, given her symptoms of effort related dyspnea and multiple cardiovascular risk factors including uncontrolled diabetes, hypertriglyceridemia, coronary artery calcification, and newly diagnosed left bundle branch block I believe an ischemic evaluation is warranted.  Echocardiogram will be ordered to evaluate for structural heart disease and left ventricular systolic function.  Nuclear stress test recommended to evaluate for reversible ischemia.  Left bundle branch block: Prior EKGs dating back to October 2017 do not show the presence of a left bundle branch block.  Most recent EKG performed at PCPs office notes this finding and also noted on today's EKG. Plan echo and nuclear stress test.  Coronary artery calcification: Currently on aspirin and statin therapy.  Non-insulin-dependent diabetes mellitus type 2: Patient is currently working with her primary care provider in regards to improving her glycemic control.  Will defer to primary  team.  Patient is educated on importance of glycemic control to decrease her chances of underlying coronary artery disease.  Benign essential hypertension: Currently managed by primary provider.  Blood pressure within acceptable range.  Educated on importance of low-salt diet.  Hypertriglyceridemia: Patient may benefit from pharmacological therapy.  Will defer to primary team.  Active tobacco use: Patient educated on importance of complete cessation of tobacco use.  FINAL MEDICATION LIST END OF ENCOUNTER: No orders of the defined types were placed in this encounter.   Medications Discontinued During This Encounter  Medication Reason  . HYDROcodone-acetaminophen (NORCO/VICODIN) 5-325 MG tablet Completed Course  . amphetamine-dextroamphetamine (ADDERALL) 20 MG tablet Patient Preference     Current Outpatient Medications:  .  ACCU-CHEK AVIVA PLUS test strip, daily. for testing, Disp: , Rfl: 2 .  ALPRAZolam (XANAX) 1 MG tablet, Take 0.5 mg by mouth 3 (three) times daily as needed for anxiety or sleep. , Disp: , Rfl:  .  aspirin 81 MG tablet, Take 81 mg by mouth daily., Disp: , Rfl:  .  betamethasone dipropionate 0.05 % lotion, Apply 1 application topically 2 (two) times daily., Disp: , Rfl:  .  cetirizine (ZYRTEC) 10 MG tablet, Take 1 tablet (10 mg total) by mouth daily., Disp: 30 tablet, Rfl: 11 .  Continuous Blood Gluc Receiver (DEXCOM G6 RECEIVER) DEVI, 1 Device by Does not apply route every morning. Device and supplies, Disp: 1 each, Rfl: 0 .  Dulaglutide (TRULICITY) 1.5 KN/3.9JQ SOPN, Inject 1.5 mg into the skin once a week., Disp: 6 mL, Rfl: 1 .  DULoxetine (CYMBALTA) 60 MG capsule, TAKE 2 CAPSULES BY MOUTH DAILY, Disp: 180 capsule, Rfl: 0 .  levothyroxine (SYNTHROID) 88 MCG tablet, TAKE 1 TABLET (88 MCG TOTAL) BY MOUTH DAILY BEFORE BREAKFAST., Disp: 90 tablet, Rfl: 1 .  lisinopril (ZESTRIL) 2.5 MG tablet, TAKE 1 TABLET BY MOUTH EVERY DAY, Disp: 90 tablet, Rfl: 1 .  meloxicam (MOBIC)  7.5 MG tablet, Take 1 tablet (7.5 mg total) by mouth daily., Disp: 10 tablet, Rfl: 0 .  metoprolol tartrate (LOPRESSOR) 50 MG tablet, TAKE 1 TABLET BY MOUTH TWICE A DAY, Disp: 180 tablet, Rfl: 0 .  ondansetron (ZOFRAN ODT) 4 MG disintegrating tablet, Take 1 tablet (4 mg total) by mouth every 8 (  eight) hours as needed for nausea or vomiting., Disp: 20 tablet, Rfl: 1 .  pravastatin (PRAVACHOL) 40 MG tablet, TAKE 1 TABLET BY MOUTH EVERY DAY, Disp: 90 tablet, Rfl: 2 .  sitaGLIPtin (JANUVIA) 100 MG tablet, Take 1 tablet (100 mg total) by mouth daily., Disp: 90 tablet, Rfl: 1 .  triamcinolone cream (KENALOG) 0.5 %, Apply 1 application topically 3 (three) times daily. Apply to back and forearms, avoid the face, Disp: 454 g, Rfl: 1 .  Vitamin D, Ergocalciferol, (DRISDOL) 1.25 MG (50000 UNIT) CAPS capsule, Take 1 capsule (50,000 Units total) by mouth every 7 (seven) days., Disp: 15 capsule, Rfl: 0 .  glipiZIDE (GLUCOTROL XL) 10 MG 24 hr tablet, TAKE 1 TABLET (10 MG TOTAL) BY MOUTH DAILY WITH BREAKFAST., Disp: 90 tablet, Rfl: 0  Current Facility-Administered Medications:  .  cefTRIAXone (ROCEPHIN) injection 1 g, 1 g, Intramuscular, Once, Stallings, Zoe A, MD  Orders Placed This Encounter  Procedures  . PCV MYOCARDIAL PERFUSION WITH LEXISCAN  . EKG 12-Lead  . PCV ECHOCARDIOGRAM COMPLETE   --Continue cardiac medications as reconciled in final medication list. --Return in about 4 weeks (around 05/20/2020) for review test results and symptoms. . Or sooner if needed. --Continue follow-up with your primary care physician regarding the management of your other chronic comorbid conditions.  Patient's questions and concerns were addressed to her satisfaction. She voices understanding of the instructions provided during this encounter.   This note was created using a voice recognition software as a result there may be grammatical errors inadvertently enclosed that do not reflect the nature of this encounter.  Every attempt is made to correct such errors.  Tessa Lerner, [place], [facility]  Pager: [phone] Office: [phone]

## 2020-04-22 ENCOUNTER — Ambulatory Visit: Payer: Medicare Other | Admitting: Cardiology

## 2020-04-22 ENCOUNTER — Encounter: Payer: Self-pay | Admitting: Cardiology

## 2020-04-22 ENCOUNTER — Other Ambulatory Visit: Payer: Self-pay

## 2020-04-22 VITALS — BP 139/64 | HR 81 | Ht 60.0 in | Wt 168.0 lb

## 2020-04-22 DIAGNOSIS — E7800 Pure hypercholesterolemia, unspecified: Secondary | ICD-10-CM

## 2020-04-22 DIAGNOSIS — I447 Left bundle-branch block, unspecified: Secondary | ICD-10-CM

## 2020-04-22 DIAGNOSIS — E119 Type 2 diabetes mellitus without complications: Secondary | ICD-10-CM | POA: Diagnosis not present

## 2020-04-22 DIAGNOSIS — I2510 Atherosclerotic heart disease of native coronary artery without angina pectoris: Secondary | ICD-10-CM | POA: Diagnosis not present

## 2020-04-22 DIAGNOSIS — R072 Precordial pain: Secondary | ICD-10-CM | POA: Diagnosis not present

## 2020-04-22 DIAGNOSIS — F17210 Nicotine dependence, cigarettes, uncomplicated: Secondary | ICD-10-CM

## 2020-04-22 DIAGNOSIS — E039 Hypothyroidism, unspecified: Secondary | ICD-10-CM

## 2020-04-22 DIAGNOSIS — I10 Essential (primary) hypertension: Secondary | ICD-10-CM

## 2020-04-22 DIAGNOSIS — R0609 Other forms of dyspnea: Secondary | ICD-10-CM

## 2020-04-22 DIAGNOSIS — R0600 Dyspnea, unspecified: Secondary | ICD-10-CM

## 2020-04-23 ENCOUNTER — Ambulatory Visit: Payer: Medicare Other

## 2020-04-23 ENCOUNTER — Other Ambulatory Visit: Payer: Self-pay

## 2020-04-23 DIAGNOSIS — R0609 Other forms of dyspnea: Secondary | ICD-10-CM

## 2020-04-23 DIAGNOSIS — R0600 Dyspnea, unspecified: Secondary | ICD-10-CM

## 2020-04-23 DIAGNOSIS — R072 Precordial pain: Secondary | ICD-10-CM | POA: Diagnosis not present

## 2020-04-28 ENCOUNTER — Encounter: Payer: Self-pay | Admitting: Cardiology

## 2020-04-28 ENCOUNTER — Other Ambulatory Visit: Payer: Self-pay | Admitting: Family Medicine

## 2020-04-28 NOTE — Telephone Encounter (Signed)
Requested Prescriptions  Pending Prescriptions Disp Refills  . glipiZIDE (GLUCOTROL XL) 10 MG 24 hr tablet [Pharmacy Med Name: GLIPIZIDE ER 10 MG TABLET] 90 tablet 0    Sig: TAKE 1 TABLET (10 MG TOTAL) BY MOUTH DAILY WITH BREAKFAST.     Endocrinology:  Diabetes - Sulfonylureas Failed - 04/28/2020  2:10 PM      Failed - HBA1C is between 0 and 7.9 and within 180 days    Hgb A1c MFr Bld  Date Value Ref Range Status  04/03/2020 11.8 (H) 4.8 - 5.6 % Final    Comment:             Prediabetes: 5.7 - 6.4          Diabetes: >6.4          Glycemic control for adults with diabetes: <7.0          Passed - Valid encounter within last 6 months    Recent Outpatient Visits          1 week ago Low serum vitamin D   [facility], Asencion Partridge, MD   3 weeks ago Uncontrolled type 2 diabetes mellitus with hyperglycemia [facility])   [facility], Asencion Partridge, MD   5 months ago Left wrist pain   [facility] at [place], Sandria Bales, MD   6 months ago Uncontrolled type 2 diabetes mellitus with hyperglycemia [facility])   [facility] at [facility], Zoe A, MD   8 months ago Acute upper respiratory infection   [facility] at [facility], Lonna Cobb, NP      Future Appointments            In 1 week Neva Seat Asencion Partridge, MD [facility] [place] [facility], [facility]   In 4 weeks Tessa Lerner, DO [facility]

## 2020-04-30 ENCOUNTER — Encounter: Payer: Self-pay | Admitting: Radiology

## 2020-04-30 ENCOUNTER — Telehealth: Payer: Self-pay

## 2020-04-30 NOTE — Progress Notes (Signed)
Left vm to cb.

## 2020-04-30 NOTE — Progress Notes (Signed)
Pt returned call, aware of results.

## 2020-04-30 NOTE — Telephone Encounter (Signed)
Left vm to cb.

## 2020-04-30 NOTE — Telephone Encounter (Signed)
-----   Message from [place], [place] sent at 04/29/2020  2:44 PM EDT ----- The left ventricular ejection fraction or pumping activity of the heart is reduced at 35 to 40%.  Please have her make a follow-up appointment once stress test is complete or sooner.  Details will be reviewed at the next office visit.

## 2020-05-01 ENCOUNTER — Other Ambulatory Visit: Payer: Self-pay | Admitting: Family Medicine

## 2020-05-09 ENCOUNTER — Ambulatory Visit (INDEPENDENT_AMBULATORY_CARE_PROVIDER_SITE_OTHER): Payer: Medicare Other | Admitting: Family Medicine

## 2020-05-09 ENCOUNTER — Encounter: Payer: Self-pay | Admitting: Family Medicine

## 2020-05-09 ENCOUNTER — Other Ambulatory Visit: Payer: Self-pay

## 2020-05-09 VITALS — BP 132/84 | HR 90 | Temp 97.3°F | Ht 60.0 in | Wt 168.0 lb

## 2020-05-09 DIAGNOSIS — S0001XA Abrasion of scalp, initial encounter: Secondary | ICD-10-CM

## 2020-05-09 DIAGNOSIS — H9201 Otalgia, right ear: Secondary | ICD-10-CM

## 2020-05-09 DIAGNOSIS — F418 Other specified anxiety disorders: Secondary | ICD-10-CM

## 2020-05-09 DIAGNOSIS — R809 Proteinuria, unspecified: Secondary | ICD-10-CM

## 2020-05-09 DIAGNOSIS — R7989 Other specified abnormal findings of blood chemistry: Secondary | ICD-10-CM | POA: Diagnosis not present

## 2020-05-09 DIAGNOSIS — R5383 Other fatigue: Secondary | ICD-10-CM | POA: Diagnosis not present

## 2020-05-09 DIAGNOSIS — I447 Left bundle-branch block, unspecified: Secondary | ICD-10-CM

## 2020-05-09 DIAGNOSIS — E1165 Type 2 diabetes mellitus with hyperglycemia: Secondary | ICD-10-CM

## 2020-05-09 NOTE — Patient Instructions (Addendum)
  I will watch for results of stress test.  follow up with psychiatry and counseling for depression symptoms. Tylenol is safest for aches/pains at this time. Avoid meloxicam, advil, alleve for now. Lisinopril sent to pharmacy June 2nd, let me know if the pharmacy does not have that medicine.  Start higher dose of trulicity. Continue glipizide and januvia same doses. Keep a record of your blood sugars to next visit - fasting or 2 hours after meals. Check 1-2 times per day. Recheck in 2 weeks with readings.   I do not appreciate a significant bump or lymph node on your exam today behind the ear.  If you do notice any increase in that bump, or the area is not improving in the next few days, I recommend recheck with myself, other provider here, or urgent care if any worsening sooner.  There is still a small healing abrasion on the top of your scalp.  You may want to delay use of hair dye or harsh hair products at this time.  Follow-up with dermatology as planned.  Return to the clinic or go to the nearest emergency room if any of your symptoms worsen or new symptoms occur.     If you have lab work done today you will be contacted with your lab results within the next 2 weeks.  If you have not heard from [place] then please contact us. The fastest way to get your results is to register for My Chart.   IF you received an x-ray today, you will receive an invoice from [facility]. Please contact [facility] at [phone] with questions or concerns regarding your invoice.   IF you received labwork today, you will receive an invoice from [place]. Please contact [facility] at [phone] with questions or concerns regarding your invoice.   Our billing staff will not be able to assist you with questions regarding bills from these companies.  You will be contacted with the lab results as soon as they are available. The fastest way to get your results is to activate your My Chart account.  Instructions are located on the last page of this paperwork. If you have not heard from [place] regarding the results in 2 weeks, please contact this office.

## 2020-05-09 NOTE — Progress Notes (Signed)
Subjective:  Patient ID: Felicia Hanson, female    DOB: 02/08/1956  Age: 64 y.o. MRN: [id number]  CC:  Chief Complaint  Patient presents with  . Follow-up    low vitamin D, fatigue,diabetes, anxiety, and depression. pt states her fatigue has improved since the last OV but not by much. pt is till having issues with her anxiety and depression. pt reports her diabetes is still a little high in her BS last night was in the 200's after eatting.    HPI Felicia Hanson presents for  Multiple concerns as above.  Follow-up from 5/20 visit.  Low vitamin D Restarted supplementation 50,000 units weekly at May 20 visit., plan for recheck level in 6 weeks.  Lab Results  Component Value Date   VD25OH 10.9 (L) 04/03/2020   Diabetes type 2, uncontrolled with hyperglycemia See last office visit May 20.  She planned on improved diet but also increased Januvia to 100 mg daily, increased Trulicity to 1.5 mg/week.  Continued glipizide same dose.  Unable to take Metformin (could not swallow pills).  Lab Results  Component Value Date   HGBA1C 11.8 (H) 04/03/2020   Home readings: 214 after eating last night.  No fasting readings. No symptomatic lows.  Has not started higher dose of Trulicity yet. Has been on higher dose of Januvia since last visit.  Has not yet started lisinopril.  Microalbumin/creatinine ratio was elevated 3 weeks ago   Fatigue: Thought to be multifactorial last visit.  Including vitamin D, diabetes control.  Did have some EKG changes with left bundle branch block that was negative.  Referred to cardiology.  Appointment May 24 with Dr. Terri Skains.  Ischemic evaluation planned with echo and nuclear stress test Stress test planned in 4 days.  Echocardiogram completed on May 25 with decreased EF at 35-40 %.  Planned cardiology follow-up after stress testing.  Was continued on statin and aspirin.  No change in antihypertensives. Only few chest pains when stressed. Not with exertion. No CP  currently.  Fatigue was better for a few days, then back to prior levels.  Has been taking meloxicam - unknown reason.   Depression/anxiety: Followed by psychiatrist, advised close follow-up regarding medications and also recommended meeting with therapist at last visit.  Has not met with therapist yet. Has been busy with other appointments.  No SI.   Saw dermatology few weeks ago. Advised to use new med to scalp on some nights, and new shampoo for scalp issue. Wants to make sure not raw to have hair colored.  Pain behind R ear: Knot under skin started yesterday.  No fevers, no pain in ear, no drainage from ear. No rash, or new scalp wounds.  Hearing ok.  Similar bump in past several years ago that went away.   History Patient Active Problem List   Diagnosis Date Noted  . Type 2 diabetes mellitus without complication, without long-term current use of insulin ([place]) 10/26/2017  . Generalized anxiety disorder 01/10/2014  . Vitamin D deficiency 01/10/2014  . Essential hypertension, benign 01/10/2014  . Pure hypercholesterolemia 01/10/2014  . Hypothyroidism 01/10/2014   Past Medical History:  Diagnosis Date  . Anxiety   . Coronary artery calcification   . Depression   . Diabetes mellitus without complication ([place])   . Hyperlipidemia   . Hypertension   . Thyroid disease    Past Surgical History:  Procedure Laterality Date  . BREAST EXCISIONAL BIOPSY Left   . CHOLECYSTECTOMY    . COLON  SURGERY     diverticulitis with perforation; s/p colon resection.  . Colonoscopy    . TUBAL LIGATION     Allergies  Allergen Reactions  . Metformin And Related Anaphylaxis    Could not swallow   Prior to Admission medications   Medication Sig Start Date End Date Taking? Authorizing Provider  ACCU-CHEK AVIVA PLUS test strip daily. for testing 03/11/18  Yes [provider]  ALPRAZolam Duanne Moron) 1 MG tablet Take 0.5 mg by mouth 3 (three) times daily as needed for anxiety or sleep.   08/21/13  Yes [provider]  aspirin 81 MG tablet Take 81 mg by mouth daily.   Yes [provider]  betamethasone dipropionate 0.05 % lotion Apply 1 application topically 2 (two) times daily. 04/10/20  Yes [provider]  Dulaglutide (TRULICITY) 1.5 TM/2.2QJ SOPN Inject 1.5 mg into the skin once a week. 04/18/20  Yes Wendie Agreste, MD  DULoxetine (CYMBALTA) 60 MG capsule TAKE 2 CAPSULES BY MOUTH DAILY 03/06/20  Yes Stallings, Zoe A, MD  glipiZIDE (GLUCOTROL XL) 10 MG 24 hr tablet TAKE 1 TABLET (10 MG TOTAL) BY MOUTH DAILY WITH BREAKFAST. 04/28/20  Yes Wendie Agreste, MD  levothyroxine (SYNTHROID) 88 MCG tablet TAKE 1 TABLET (88 MCG TOTAL) BY MOUTH DAILY BEFORE BREAKFAST. 02/07/20  Yes Stallings, Zoe A, MD  lisinopril (ZESTRIL) 2.5 MG tablet TAKE 1 TABLET BY MOUTH EVERY DAY 05/01/20  Yes Wendie Agreste, MD  metoprolol tartrate (LOPRESSOR) 50 MG tablet TAKE 1 TABLET BY MOUTH TWICE A DAY 03/14/20  Yes Stallings, Zoe A, MD  ondansetron (ZOFRAN ODT) 4 MG disintegrating tablet Take 1 tablet (4 mg total) by mouth every 8 (eight) hours as needed for nausea or vomiting. 07/14/19  Yes Stallings, Zoe A, MD  pravastatin (PRAVACHOL) 40 MG tablet TAKE 1 TABLET BY MOUTH EVERY DAY 03/14/20  Yes Stallings, Zoe A, MD  sitaGLIPtin (JANUVIA) 100 MG tablet Take 1 tablet (100 mg total) by mouth daily. 04/18/20  Yes Wendie Agreste, MD  triamcinolone cream (KENALOG) 0.5 % Apply 1 application topically 3 (three) times daily. Apply to back and forearms, avoid the face 10/18/19  Yes Stallings, Zoe A, MD  Vitamin D, Ergocalciferol, (DRISDOL) 1.25 MG (50000 UNIT) CAPS capsule Take 1 capsule (50,000 Units total) by mouth every 7 (seven) days. 04/18/20  Yes Wendie Agreste, MD  cetirizine (ZYRTEC) 10 MG tablet Take 1 tablet (10 mg total) by mouth daily. Patient not taking: Reported on 05/09/2020 10/18/19   Forrest Moron, MD  Continuous Blood Gluc Receiver (DEXCOM G6 RECEIVER) DEVI 1 Device by Does  not apply route every morning. Device and supplies Patient not taking: Reported on 05/09/2020 04/18/20   Wendie Agreste, MD  meloxicam (MOBIC) 7.5 MG tablet Take 1 tablet (7.5 mg total) by mouth daily. Patient not taking: Reported on 05/09/2020 11/10/19   Arturo Morton   Social History   Socioeconomic History  . Marital status: Married    Spouse name: Not on file  . Number of children: Not on file  . Years of education: Not on file  . Highest education level: Not on file  Occupational History  . Not on file  Tobacco Use  . Smoking status: Current Every Day Smoker    Packs/day: 0.75    Years: 38.00    Pack years: 28.50    Types: Cigarettes  . Smokeless tobacco: Never Used  Vaping Use  . Vaping Use: Former  Substance and Sexual  Activity  . Alcohol use: No    Alcohol/week: 0.0 standard drinks  . Drug use: No  . Sexual activity: Yes  Other Topics Concern  . 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 Difficulty of Paying Living Expenses:   Food Insecurity:   . Worried About Charity fundraiser in the Last Year:   . Arboriculturist in the Last Year:   Transportation Needs:   . Film/video editor (Medical):   Marland Kitchen Lack of Transportation (Non-Medical):   Physical Activity:   . Days of Exercise per Week:   . Minutes of Exercise per Session:   Stress:   . Feeling of Stress :   Social Connections:   . Frequency of Communication with Friends and Family:   . Frequency of Social Gatherings with Friends and Family:   . Attends Religious Services:   . Active Member of Clubs or Organizations:   . Attends Archivist Meetings:   Marland Kitchen Marital Status:   Intimate Partner Violence:   . Fear of Current or Ex-Partner:   .  Emotionally Abused:   Marland Kitchen Physically Abused:   . Sexually Abused:     Review of Systems Per HPI.   Objective:   Vitals:   05/09/20 1605 05/09/20 1623  BP: (!) 156/78 132/84  Pulse: 90   Temp: (!) 97.3 F (36.3 C)   TempSrc: Temporal   SpO2: 94%   Weight: 168 lb (76.2 kg)   Height: 5' (1.524 m)      Physical Exam Vitals reviewed.  Constitutional:      Appearance: She is well-developed.  HENT:     Head: Normocephalic and atraumatic.     Right Ear: Tympanic membrane, ear canal and external ear normal.     Ears:     Comments: Reports area of bump posterior auricular, but I do not appreciate significant node or swelling of that area.  No external rash.  No appreciable lymphadenopathy at right neck. Eyes:     Conjunctiva/sclera: Conjunctivae normal.     Pupils: Pupils are equal, round, and reactive to light.  Neck:     Vascular: No carotid bruit.  Cardiovascular:     Rate and Rhythm: Normal rate and regular rhythm.     Heart sounds: Normal heart sounds.  Pulmonary:     Effort: Pulmonary effort is normal.     Breath sounds: Normal breath sounds.  Abdominal:     Palpations: Abdomen is soft. There is no pulsatile mass.     Tenderness: There is no abdominal tenderness.  Skin:    General: Skin is warm and dry.     Comments: Small healing abrasion on dorsal scalp.  No surrounding inflammation/erythema.  No discharge.  Neurological:     Mental Status: She is alert and oriented to person, place, and time.  Psychiatric:        Behavior: Behavior normal.        Assessment & Plan:  BEVELYN ARRIOLA is a 64 y.o. female . Uncontrolled type 2 diabetes mellitus with hyperglycemia ([place])  - starting higher dose trulicity, tolerating  Januvia higher dose. Check home readings with repeat eval in office in 2 weeks.   Microalbuminuria  - start lisinopril - appears to be at pharmacy. Can reorder if needed.   Fatigue, unspecified type Left bundle branch block (LBBB) Low serum  vitamin D Depression with anxiety  - possibly multifactorial fatigue with low vitamin D, depression, uncontrolled diabetes, and undergoing cardiac workup. continue vit D supplement,  Plan for stress testing. ER/RTC precautions.   - follow up with psychiatry and counseling for depression/anxiety.   Abrasion, scalp w/o infection Ear pain, right  - no appreciable lymphadenopathy at present, but possible reactive to scalp abrasion, which appears to be healing. rtc precautions.    No orders of the defined types were placed in this encounter.  Patient Instructions    I will watch for results of stress test.  follow up with psychiatry and counseling for depression symptoms. Tylenol is safest for aches/pains at this time. Avoid meloxicam, advil, alleve for now. Lisinopril sent to pharmacy June 2nd, let me know if the pharmacy does not have that medicine.  Start higher dose of trulicity. Continue glipizide and januvia same doses. Keep a record of your blood sugars to next visit - fasting or 2 hours after meals. Check 1-2 times per day. Recheck in 2 weeks with readings.   I do not appreciate a significant bump or lymph node on your exam today behind the ear.  If you do notice any increase in that bump, or the area is not improving in the next few days, I recommend recheck with myself, other provider here, or urgent care if any worsening sooner.  There is still a small healing abrasion on the top of your scalp.  You may want to delay use of hair dye or harsh hair products at this time.  Follow-up with dermatology as planned.  Return to the clinic or go to the nearest emergency room if any of your symptoms worsen or new symptoms occur.     If you have lab work done today you will be contacted with your lab results within the next 2 weeks.  If you have not heard from [place] then please contact us. The fastest way to get your results is to register for My Chart.   IF you received an x-ray today, you  will receive an invoice from [facility]. Please contact [facility] at [phone] with questions or concerns regarding your invoice.   IF you received labwork today, you will receive an invoice from [facility]. Please contact [facility] at [phone] with questions or concerns regarding your invoice.   Our billing staff will not be able to assist you with questions regarding bills from these companies.  You will be contacted with the lab results as soon as they are available. The fastest way to get your results is to activate your My Chart account. Instructions are located on the last page of this paperwork. If you have not heard from [place] regarding the results in 2 weeks, please contact this office.         Signed, Merri Ray, MD [facility]

## 2020-05-13 ENCOUNTER — Ambulatory Visit: Payer: Medicare Other

## 2020-05-13 ENCOUNTER — Other Ambulatory Visit: Payer: Self-pay

## 2020-05-13 DIAGNOSIS — I447 Left bundle-branch block, unspecified: Secondary | ICD-10-CM | POA: Diagnosis not present

## 2020-05-13 DIAGNOSIS — R0609 Other forms of dyspnea: Secondary | ICD-10-CM | POA: Diagnosis not present

## 2020-05-13 DIAGNOSIS — R0600 Dyspnea, unspecified: Secondary | ICD-10-CM

## 2020-05-13 DIAGNOSIS — R072 Precordial pain: Secondary | ICD-10-CM

## 2020-05-15 ENCOUNTER — Telehealth: Payer: Self-pay

## 2020-05-15 NOTE — Telephone Encounter (Signed)
LMTCB with husband for results.

## 2020-05-15 NOTE — Telephone Encounter (Signed)
-----   Message from [place], [place] sent at 05/14/2020  6:34 PM EDT ----- Please inform the patient that the stress test did not report evidence of blockage.  However, her pumping activity or EF is reduced. Therefore, the test is reported to be high risk study.   Please have her move up her follow up appt to later this week to discuss the results.

## 2020-05-20 ENCOUNTER — Other Ambulatory Visit: Payer: Self-pay

## 2020-05-20 ENCOUNTER — Ambulatory Visit: Payer: Medicare Other | Admitting: Cardiology

## 2020-05-20 ENCOUNTER — Encounter: Payer: Self-pay | Admitting: Cardiology

## 2020-05-20 VITALS — BP 149/77 | HR 78 | Resp 15 | Ht 60.0 in | Wt 167.0 lb

## 2020-05-20 DIAGNOSIS — R9439 Abnormal result of other cardiovascular function study: Secondary | ICD-10-CM

## 2020-05-20 DIAGNOSIS — R0609 Other forms of dyspnea: Secondary | ICD-10-CM

## 2020-05-20 DIAGNOSIS — I447 Left bundle-branch block, unspecified: Secondary | ICD-10-CM

## 2020-05-20 DIAGNOSIS — E7800 Pure hypercholesterolemia, unspecified: Secondary | ICD-10-CM

## 2020-05-20 DIAGNOSIS — I2510 Atherosclerotic heart disease of native coronary artery without angina pectoris: Secondary | ICD-10-CM

## 2020-05-20 DIAGNOSIS — F17210 Nicotine dependence, cigarettes, uncomplicated: Secondary | ICD-10-CM

## 2020-05-20 DIAGNOSIS — I10 Essential (primary) hypertension: Secondary | ICD-10-CM

## 2020-05-20 DIAGNOSIS — R072 Precordial pain: Secondary | ICD-10-CM

## 2020-05-20 DIAGNOSIS — R0600 Dyspnea, unspecified: Secondary | ICD-10-CM

## 2020-05-20 DIAGNOSIS — E119 Type 2 diabetes mellitus without complications: Secondary | ICD-10-CM

## 2020-05-20 MED ORDER — NITROGLYCERIN 0.4 MG SL SUBL: 0.4000 mg | SUBLINGUAL_TABLET | SUBLINGUAL | 0 refills | Status: DC | PRN

## 2020-05-20 MED ORDER — PRAVASTATIN SODIUM 80 MG PO TABS: 80.0000 mg | ORAL_TABLET | Freq: Every evening | ORAL | 0 refills | Status: DC

## 2020-05-20 NOTE — Progress Notes (Signed)
Date:  05/20/2020   ID:  Felicia Hanson, DOB 1955-12-22, MRN [id number]  PCP:  Shade Flood, MD  Cardiologist:  Tessa Lerner, DO, [facility] (established care 04/22/2020)  Date: 05/20/20 Last Office Visit: 04/22/2020  Chief Complaint  Patient presents with  . Follow-up    4 week  . Results    HPI   Felicia Hanson is a 64 y.o. female who presents to the office with a  chief complaint of " review test results and reevaluate chest pain or shortness of breath."  Her past medical history and cardiovascular risk factors are: uncontrolled Type 2 diabetes with hyperglycemia, pure hypercholesterolemia, hypertriglyceridemia, coronary artery calcification, newly diagnosed left bundle branch block, essential hypertension, hypothyroidism, tobacco use disorder, vitamin D deficiency, generalized anxiety disorder.    Patient is accompanied by her sister [name] and provides verbal consent to discuss her medical condition in her presence.   Patient was originally referred to the office for evaluation of chest pain at request of her primary physician.   Last office visit patient has symptoms of chest pain with typical and atypical features. Given her symptoms of effort related dyspnea and multiple cardiovascular risk factors including uncontrolled diabetes, hypertriglyceridemia, coronary artery calcification, and newly diagnosed left bundle branch block recommend an echocardiogram and nuclear stress test.  Since last office visit patient states that she continues to have substernal chest pain, not with effort related activities, it is improved with rest.  He also has effort related dyspnea which is stable.  Smoking on a daily basis but is now down to three fourths of a pack per day.   Patient was seen on lisinopril by primary care provider for underlying hypertension.  However, patient is unable to pick up the medication at her pharmacy.  She sees her primary care physician later this week and will  have it addressed.  Did discuss with both the patient and her sister that echocardiogram noted reduced left ventricular systolic function and nuclear stress test noted no underlying ischemia or scar but due to dilated LV cavity and reduced LVEF by gated SPECT findings are consistent with presumed nonischemic cardiomyopathy.  No family history of premature coronary disease or sudden cardiac death.  Denies prior history of coronary artery disease, myocardial infarction, congestive heart failure, deep venous thrombosis, pulmonary embolism, stroke, transient ischemic attack.  FUNCTIONAL STATUS: No structured exercise program or daily routine.  But does actively take care of her grandkids.  ALLERGIES: Allergies  Allergen Reactions  . Metformin And Related Anaphylaxis    Could not swallow    MEDICATION LIST PRIOR TO VISIT: Current Meds  Medication Sig  . ACCU-CHEK AVIVA PLUS test strip daily. for testing  . ALPRAZolam (XANAX) 1 MG tablet Take 0.5 mg by mouth 3 (three) times daily as needed for anxiety or sleep.   Marland Kitchen aspirin 81 MG tablet Take 81 mg by mouth daily.  . betamethasone dipropionate 0.05 % lotion Apply 1 application topically as needed.   . cetirizine (ZYRTEC) 10 MG tablet Take 1 tablet (10 mg total) by mouth daily.  . Continuous Blood Gluc Receiver (DEXCOM G6 RECEIVER) DEVI 1 Device by Does not apply route every morning. Device and supplies  . Dulaglutide (TRULICITY) 1.5 MG/0.5ML SOPN Inject 1.5 mg into the skin once a week.  . DULoxetine (CYMBALTA) 60 MG capsule TAKE 2 CAPSULES BY MOUTH DAILY  . glipiZIDE (GLUCOTROL XL) 10 MG 24 hr tablet TAKE 1 TABLET (10 MG TOTAL) BY MOUTH DAILY WITH BREAKFAST.  Marland Kitchen  levothyroxine (SYNTHROID) 88 MCG tablet TAKE 1 TABLET (88 MCG TOTAL) BY MOUTH DAILY BEFORE BREAKFAST.  . metoprolol tartrate (LOPRESSOR) 50 MG tablet TAKE 1 TABLET BY MOUTH TWICE A DAY  . ondansetron (ZOFRAN ODT) 4 MG disintegrating tablet Take 1 tablet (4 mg total) by mouth every 8  (eight) hours as needed for nausea or vomiting.  . sitaGLIPtin (JANUVIA) 100 MG tablet Take 1 tablet (100 mg total) by mouth daily.  Marland Kitchen triamcinolone cream (KENALOG) 0.5 % Apply 1 application topically 3 (three) times daily. Apply to back and forearms, avoid the face  . Vitamin D, Ergocalciferol, (DRISDOL) 1.25 MG (50000 UNIT) CAPS capsule Take 1 capsule (50,000 Units total) by mouth every 7 (seven) days.  . [DISCONTINUED] pravastatin (PRAVACHOL) 40 MG tablet TAKE 1 TABLET BY MOUTH EVERY DAY     PAST MEDICAL HISTORY: Past Medical History:  Diagnosis Date  . Anxiety   . Coronary artery calcification   . Depression   . Diabetes mellitus without complication (HCC)   . Hyperlipidemia   . Hypertension   . Thyroid disease     PAST SURGICAL HISTORY: Past Surgical History:  Procedure Laterality Date  . BREAST EXCISIONAL BIOPSY Left   . CHOLECYSTECTOMY    . COLON SURGERY     diverticulitis with perforation; s/p colon resection.  . Colonoscopy    . TUBAL LIGATION      FAMILY HISTORY: The patient family history includes Breast cancer in her paternal aunt; Cancer in her mother; Hyperlipidemia in her mother.  SOCIAL HISTORY:  The patient  reports that she has been smoking cigarettes. She has a 28.50 pack-year smoking history. She has never used smokeless tobacco. She reports that she does not drink alcohol and does not use drugs.  REVIEW OF SYSTEMS: Review of Systems  Constitutional: Negative for chills and fever.  HENT: Negative for hoarse voice and nosebleeds.   Eyes: Negative for discharge, double vision and pain.  Cardiovascular: Positive for chest pain and dyspnea on exertion. Negative for claudication, leg swelling, near-syncope, orthopnea, palpitations, paroxysmal nocturnal dyspnea and syncope.  Respiratory: Positive for shortness of breath. Negative for hemoptysis.   Musculoskeletal: Negative for muscle cramps and myalgias.  Gastrointestinal: Negative for abdominal pain,  constipation, diarrhea, hematemesis, hematochezia, melena, nausea and vomiting.  Neurological: Negative for dizziness and light-headedness.    PHYSICAL EXAM: Vitals with BMI 05/20/2020 05/09/2020 05/09/2020  Height 5\' 0"  - 5\' 0"   Weight 167 lbs - 168 lbs  BMI 32.62 - 32.81  Systolic 149 132 537  Diastolic 77 84 78  Pulse 78 - 90    CONSTITUTIONAL: Well-developed and well-nourished. No acute distress.  SKIN: Skin is warm and dry. No rash noted. No cyanosis. No pallor. No jaundice HEAD: Normocephalic and atraumatic.  EYES: No scleral icterus MOUTH/THROAT: Moist oral membranes.  NECK: No JVD present. No thyromegaly noted. No carotid bruits  LYMPHATIC: No visible cervical adenopathy.  CHEST Normal respiratory effort. No intercostal retractions  LUNGS: Clear to auscultation bilaterally.  No stridor. No wheezes. No rales.  CARDIOVASCULAR: Regular rate and rhythm, positive S1-S2, no murmurs rubs or gallops appreciated. ABDOMINAL: Obese, soft, nontender, nondistended, positive bowel sounds in all 4 quadrants.  No apparent ascites.  EXTREMITIES: No peripheral edema  HEMATOLOGIC: No significant bruising NEUROLOGIC: Oriented to person, place, and time. Nonfocal. Normal muscle tone.  PSYCHIATRIC: Normal mood and affect. Normal behavior. Cooperative  CARDIAC DATABASE:  EKG:  04/22/2020: Normal sinus rhythm, 77 bpm, left axis deviation, left bundle branch block, left atrial enlargement,  poor R wave progression.  Coronary calcium scoring 05/13/2015: LAD: 8, Cx: 2. Total calcium score is 10.   Echocardiogram: 03/2015: LVEF 44%, mild LVH, mild to moderate decrease in global wall motion, grade 2 diastolic impairment, mildly dilated left atrium, mild MR and TR.  04/23/2020: Left ventricle cavity is normal in size. Moderate concentric hypertrophy of the left ventricle. Moderate global hypokinesis. LVEF 35-40%. Indeterminate diastolic function due to E/A fusion.  Moderately dilated left atrium, mild  MR.  Stress Testing: Lexiscan sestamibi stress test: 04/08/2015: Myocardial perfusion imaging is normal. Overall left ventricular systolic function was normal without regional wall motion abnormalities. The left ventricular ejection fraction was 61%.  Lexiscan Tetrofosmin Stress Test 05/13/2020:  There is a fixed mild defect in the inferior region due to diaphragmatic attenuation. No ischemia or scar.  The LV is moderately dilated with LV end diastolic volume of 173 mL.  Overall LV systolic function is abnormal without regional wall motion abnormalities. Stress LV EF: 21%.   High risk study due to low LVEF. Findings suggest non ischemic dilated cardiomyopathy.  Heart Catheterization: None   LABORATORY DATA: CBC Latest Ref Rng & Units 02/03/2019 08/11/2018 02/11/2018  WBC 3.4 - 10.8 x10E3/uL 10.3 12.7(A) 11.0(H)  Hemoglobin 11.1 - 15.9 g/dL 16.1 09.6 04.5  Hematocrit 34.0 - 46.6 % 39.9 43.9 46.0  Platelets 150 - 450 x10E3/uL 217 - 268    CMP Latest Ref Rng & Units 04/03/2020 10/18/2019 06/26/2019  Glucose 65 - 99 mg/dL 409(W) 119(J) 478(G)  BUN 8 - 27 mg/dL 9 5(L) 7(L)  Creatinine 0.57 - 1.00 mg/dL 9.56 2.13 0.86  Sodium 134 - 144 mmol/L 132(L) 138 135  Potassium 3.5 - 5.2 mmol/L 4.7 4.8 3.6  Chloride 96 - 106 mmol/L 93(L) 97 96  CO2 20 - 29 mmol/L 25 28 18(L)  Calcium 8.7 - 10.3 mg/dL 57.8 9.8 9.3  Total Protein 6.0 - 8.5 g/dL 8.2 7.8 7.3  Total Bilirubin 0.0 - 1.2 mg/dL 0.4 0.6 0.5  Alkaline Phos 39 - 117 IU/L 109 92 98  AST 0 - 40 IU/L ALT 0 - 32 IU/L Lipid Panel     Component Value Date/Time   CHOL 189 04/03/2020 1757   TRIG 248 (H) 04/03/2020 1757   HDL 49 04/03/2020 1757   CHOLHDL 3.9 04/03/2020 1757   CHOLHDL 5.0 08/09/2015 1504   VLDL 44 (H) 08/09/2015 1504   LDLCALC 98 04/03/2020 1757   LABVLDL 42 (H) 04/03/2020 1757   Lipid Panel Recent Labs    06/26/19 1217 10/18/19 1712 04/03/20 1757  CHOL 168 177 189  TRIG 161* 192* 248*  LDLCALC  88 97 98  HDL 48 47 49  CHOLHDL 3.5 3.8 3.9    Lab Results  Component Value Date   HGBA1C 11.8 (H) 04/03/2020   HGBA1C 8.6 (A) 10/18/2019   HGBA1C 14.0 (A) 06/26/2019   No components found for: NTPROBNP Lab Results  Component Value Date   TSH 0.732 04/03/2020   TSH CANCELED 06/26/2019   TSH 1.180 02/03/2019    BMP Recent Labs    06/26/19 1217 10/18/19 1712 04/03/20 1757  NA 135 138 132*  K 3.6 4.8 4.7  CL 96 97 93*  CO2 18* 28 25  GLUCOSE 269* 219* 307*  BUN 7* 5* 9  CREATININE 0.72 0.71 0.77  CALCIUM 9.3 9.8 10.1  GFRNONAA 89 90 81  GFRAA 102 104 94    IMPRESSION:  ICD-10-CM   1. Abnormal stress test  R94.39 nitroGLYCERIN (NITROSTAT) 0.4 MG SL tablet  2. Precordial chest pain  R07.2   3. Dyspnea on exertion  R06.00   4. LBBB (left bundle branch block)  I44.7   5. Coronary artery calcification seen on computed tomography  I25.10   6. Non-insulin dependent type 2 diabetes mellitus (HCC)  E11.9   7. Benign hypertension  I10   8. Hypercholesterolemia  E78.00 pravastatin (PRAVACHOL) 80 MG tablet  9. Cigarette smoker  F17.210      RECOMMENDATIONS: Felicia Hanson is a 64 y.o. female whose past medical history and cardiac risk factors include: uncontrolled Type 2 diabetes with hyperglycemia, pure hypercholesterolemia, hypertriglyceridemia, coronary artery calcification, newly diagnosed left bundle branch block, essential hypertension, hypothyroidism, tobacco use disorder, vitamin D deficiency, generalized anxiety disorder.   Abnormal nuclear stress test:  Patient continues to have substernal chest pain, effort related dyspnea, more prominent during stressful situations, and does improve with rest.  The symptoms are not classic anginal symptoms it may be her anginal equivalent.  Echocardiogram noted moderately reduced left ventricular systolic function with global hypokinesis.  Nuclear stress test noted dilated left ventricular cavity and severely reduced left  ventricular systolic function by gated images; however, no obvious evidence of reversible ischemia or prior scar.  Given the patient's symptoms, diagnostic work-up, and multiple cardiovascular risk factors such as uncontrolled diabetes, hypertriglyceridemia, coronary artery calcification, and newly diagnosed left bundle branch block recommended either coronary CTA or left heart catheterization with possible intervention.  Discussed risks, benefits, and alternatives of both diagnostic modalities with the patient and her sister.  They would like some time to think about it prior to proceeding with either coronaryCTA or LHC.   The left heart catheterization procedure was explained to the patient in detail. The indication, alternatives, risks and benefits were reviewed. Complications including but not limited to bleeding, infection, acute kidney injury, blood transfusion, heart rhythm disturbances, contrast (dye) reaction, damage to the arteries or nerves in the legs or hands, cerebrovascular accident, myocardial infarction, need for emergent bypass surgery, blood clots in the legs, possible need for emergent blood transfusion, and rarely death were reviewed and discussed with the patient. The patient voices understanding and wishes to proceed.   Continue aspirin.  Continue Lopressor 50 mg p.o. twice daily  Increase pravastatin to 80 mg p.o. nightly  Prescribed sublingual nitroglycerin tablets for as needed basis.  Medication profile discussed with patient and her sister.  Patient will follow up with her primary care physician for the management of diabetes and hypertension.   I would like to see the patient in close follow-up to see how her symptoms are doing and to uptitrate her guideline directed medical therapy.  Interim, patient is educated on following 911 and going to the closest hospital via EMS if her symptoms increase in intensity, frequency, duration, or has typical discomfort as  discussed in the office.  Left bundle branch block: Monitor for now.   Coronary artery calcification: Currently on aspirin and statin therapy.  Non-insulin-dependent diabetes mellitus type 2: Most recent hemoglobin A1c reviewed.  Currently managed per primary team.  Benign essential hypertension: Currently managed by primary provider. Educated on importance of low-salt diet.  Hypertriglyceridemia: Patient may benefit from pharmacological therapy.  Will defer to primary team.  Active tobacco use: Patient is down to three fourths of a pack per day since last office visit.  Patient educated on importance of complete cessation of tobacco use.  Total encounter  time 40 minutes.  Independently reviewed the nuclear stress test results with the patient and her sister, complex medical decision making in regards to patient's symptoms and diagnostic evaluation.  Patient will call the office back in regards to if she would like to proceed with either coronary CTA versus left heart catheterization with possible intervention.  FINAL MEDICATION LIST END OF ENCOUNTER: Meds ordered this encounter  Medications  . pravastatin (PRAVACHOL) 80 MG tablet    Sig: Take 1 tablet (80 mg total) by mouth every evening.    Dispense:  90 tablet    Refill:  0  . nitroGLYCERIN (NITROSTAT) 0.4 MG SL tablet    Sig: Place 1 tablet (0.4 mg total) under the tongue every 5 (five) minutes as needed for chest pain. If you require more than two tablets five minutes apart go to the nearest ER via EMS.    Dispense:  30 tablet    Refill:  0    Medications Discontinued During This Encounter  Medication Reason  . cefTRIAXone (ROCEPHIN) injection 1 g   . meloxicam (MOBIC) 7.5 MG tablet Discontinued by provider  . pravastatin (PRAVACHOL) 40 MG tablet Dose change     Current Outpatient Medications:  .  ACCU-CHEK AVIVA PLUS test strip, daily. for testing, Disp: , Rfl: 2 .  ALPRAZolam (XANAX) 1 MG tablet, Take 0.5 mg by mouth 3  (three) times daily as needed for anxiety or sleep. , Disp: , Rfl:  .  aspirin 81 MG tablet, Take 81 mg by mouth daily., Disp: , Rfl:  .  betamethasone dipropionate 0.05 % lotion, Apply 1 application topically as needed. , Disp: , Rfl:  .  cetirizine (ZYRTEC) 10 MG tablet, Take 1 tablet (10 mg total) by mouth daily., Disp: 30 tablet, Rfl: 11 .  Continuous Blood Gluc Receiver (DEXCOM G6 RECEIVER) DEVI, 1 Device by Does not apply route every morning. Device and supplies, Disp: 1 each, Rfl: 0 .  Dulaglutide (TRULICITY) 1.5 MG/0.5ML SOPN, Inject 1.5 mg into the skin once a week., Disp: 6 mL, Rfl: 1 .  DULoxetine (CYMBALTA) 60 MG capsule, TAKE 2 CAPSULES BY MOUTH DAILY, Disp: 180 capsule, Rfl: 0 .  glipiZIDE (GLUCOTROL XL) 10 MG 24 hr tablet, TAKE 1 TABLET (10 MG TOTAL) BY MOUTH DAILY WITH BREAKFAST., Disp: 90 tablet, Rfl: 0 .  levothyroxine (SYNTHROID) 88 MCG tablet, TAKE 1 TABLET (88 MCG TOTAL) BY MOUTH DAILY BEFORE BREAKFAST., Disp: 90 tablet, Rfl: 1 .  metoprolol tartrate (LOPRESSOR) 50 MG tablet, TAKE 1 TABLET BY MOUTH TWICE A DAY, Disp: 180 tablet, Rfl: 0 .  ondansetron (ZOFRAN ODT) 4 MG disintegrating tablet, Take 1 tablet (4 mg total) by mouth every 8 (eight) hours as needed for nausea or vomiting., Disp: 20 tablet, Rfl: 1 .  sitaGLIPtin (JANUVIA) 100 MG tablet, Take 1 tablet (100 mg total) by mouth daily., Disp: 90 tablet, Rfl: 1 .  triamcinolone cream (KENALOG) 0.5 %, Apply 1 application topically 3 (three) times daily. Apply to back and forearms, avoid the face, Disp: 454 g, Rfl: 1 .  Vitamin D, Ergocalciferol, (DRISDOL) 1.25 MG (50000 UNIT) CAPS capsule, Take 1 capsule (50,000 Units total) by mouth every 7 (seven) days., Disp: 15 capsule, Rfl: 0 .  lisinopril (ZESTRIL) 2.5 MG tablet, TAKE 1 TABLET BY MOUTH EVERY DAY (Patient not taking: Reported on 05/20/2020), Disp: 90 tablet, Rfl: 0 .  nitroGLYCERIN (NITROSTAT) 0.4 MG SL tablet, Place 1 tablet (0.4 mg total) under the tongue every 5 (five)  minutes as  needed for chest pain. If you require more than two tablets five minutes apart go to the nearest ER via EMS., Disp: 30 tablet, Rfl: 0 .  pravastatin (PRAVACHOL) 80 MG tablet, Take 1 tablet (80 mg total) by mouth every evening., Disp: 90 tablet, Rfl: 0  No orders of the defined types were placed in this encounter.  --Continue cardiac medications as reconciled in final medication list. --Return in about 4 weeks (around 06/17/2020) for re-evaluation of symptoms and abnormal stress test. . Or sooner if needed. --Continue follow-up with your primary care physician regarding the management of your other chronic comorbid conditions.  Patient's questions and concerns were addressed to her satisfaction. She voices understanding of the instructions provided during this encounter.   This note was created using a voice recognition software as a result there may be grammatical errors inadvertently enclosed that do not reflect the nature of this encounter. Every attempt is made to correct such errors.  Rex Kras, [place], [facility]  Pager: [phone] Office: [phone]

## 2020-05-23 ENCOUNTER — Other Ambulatory Visit: Payer: Self-pay

## 2020-05-23 ENCOUNTER — Ambulatory Visit (INDEPENDENT_AMBULATORY_CARE_PROVIDER_SITE_OTHER): Payer: Medicare Other | Admitting: Family Medicine

## 2020-05-23 ENCOUNTER — Encounter: Payer: Self-pay | Admitting: Family Medicine

## 2020-05-23 VITALS — BP 142/92 | HR 95 | Temp 97.7°F | Ht 60.0 in | Wt 166.0 lb

## 2020-05-23 DIAGNOSIS — E1165 Type 2 diabetes mellitus with hyperglycemia: Secondary | ICD-10-CM | POA: Diagnosis not present

## 2020-05-23 DIAGNOSIS — Z1211 Encounter for screening for malignant neoplasm of colon: Secondary | ICD-10-CM

## 2020-05-23 DIAGNOSIS — I447 Left bundle-branch block, unspecified: Secondary | ICD-10-CM | POA: Diagnosis not present

## 2020-05-23 DIAGNOSIS — R079 Chest pain, unspecified: Secondary | ICD-10-CM | POA: Diagnosis not present

## 2020-05-23 LAB — GLUCOSE, POCT (MANUAL RESULT ENTRY): POC Glucose: 371 mg/dl — AB (ref 70–99)

## 2020-05-23 MED ORDER — LANTUS SOLOSTAR 100 UNIT/ML ~~LOC~~ SOPN: 10.0000 [IU] | PEN_INJECTOR | Freq: Every day | SUBCUTANEOUS | 2 refills | Status: DC

## 2020-05-23 NOTE — Patient Instructions (Addendum)
Stop glipizide. Start lantus once per night. 10 units per night initially. Increase by 2 units every 3 days until blood sugars are below 200.  Do not skip meals. Return to prior dose if any low readings.  Return to the clinic or go to the nearest emergency room if any of your symptoms worsen or new symptoms occur. Continue trulicity and [place].  Recheck in 2 weeks.    Insulin Injection Instructions, Using Insulin Pens, Adult A subcutaneous injection is a shot of medicine that is injected into the layer of fat and tissue between skin and muscle. People with type 1 diabetes must take insulin because their bodies do not make it. People with type 2 diabetes may need to take insulin. There are many different types of insulin. The type of insulin that you take may determine how many injections you give yourself and when you need to give the injections. Supplies needed:  Soap and water to wash hands.  Your insulin pen.  A new, unused needle.  Alcohol wipes.  A disposal container that is meant for sharp items (sharps container), such as an empty plastic bottle with a cover. How to choose a site for injection The body absorbs insulin differently, depending on where the insulin is injected (injection site). It is best to inject insulin into the same body area each time (for example, always in the abdomen), but you should use a different spot in that area for each injection. Do not inject the insulin in the same spot each time. There are five main areas that can be used for injecting. These areas include:  Abdomen. This is the preferred area.  Front of thigh.  Upper, outer side of thigh.  Upper, outer side of arm.  Upper, outer part of buttock. How to use an insulin pen  First, follow the steps for Get ready, then continue with the steps for Inject the insulin. Get ready 1. Wash your hands with soap and water. If soap and water are not available, use hand sanitizer. 2. Before you give  yourself an insulin injection, be sure to test your blood sugar level (blood glucose level) and write down that number. Follow any instructions from your health care provider about what to do if your blood glucose level is higher or lower than your normal range. 3. Check the expiration date and the type of insulin that is in the pen. 4. If you are using CLEAR insulin, check to see that it is clear and free of clumps. 5. If you are using CLOUDY insulin, do not shake the pen to get the injection ready. Instead, get it ready in one of these ways: ? Gently roll the pen between your palms several times. ? Tip the pen up and down several times. 6. Remove the cap from the insulin pen. 7. Use an alcohol wipe to clean the rubber tip of the pen. 8. Remove the protective paper tab from the disposable needle. Do not let the needle touch anything. 9. Screw a new, unused needle onto the pen. 10. Remove the outer plastic needle cover. Do not throw away the outer plastic cover yet. ? If the pen uses a special safety needle, leave the inner needle shield in place. ? If the pen does not use a special safety needle, remove the inner plastic cover from the needle. 11. Follow the manufacturer's instructions to prime the insulin pen with the volume of insulin needed. Hold the pen with the needle pointing up, and push  the button on the opposite end of the pen until a drop of insulin appears at the needle tip. If no insulin appears, repeat this step. 12. Turn the button (dial) to the number of units of insulin that you will be injecting. Inject the insulin 1. Use an alcohol wipe to clean the site where you will be injecting the needle. Let the site air-dry. 2. Hold the pen in the palm of your writing hand like a pencil. 3. If directed by your health care provider, use your other hand to pinch and hold about an inch (2.5 cm) of skin at the injection site. Do not directly touch the cleaned part of the skin. 4. Gently but  quickly, use your writing hand to put the needle straight into the skin. The needle should be at a 90-degree angle (perpendicular) to the skin. 5. When the needle is completely inserted into the skin, use your thumb or index finger of your writing hand to push the top button of the pen down all the way to inject the insulin. 6. Let go of the skin that you are pinching. Continue to hold the pen in place with your writing hand. 7. Wait 10 seconds, then pull the needle straight out of the skin. This will allow all of the insulin to go from the pen and needle into your body. 8. Carefully put the larger (outer) plastic cover of the needle back over the needle, then unscrew the capped needle and discard it in a sharps container, such as an empty plastic bottle with a cover. 9. Put the plastic cap back on the insulin pen. How to throw away supplies  Discard all used needles in a puncture-proof sharps disposal container. You can ask your local pharmacy about where you can get this kind of disposal container, or you can use an empty plastic liquid laundry detergent bottle that has a cover.  Follow the disposal regulations for the area where you live. Do not use any needle more than one time.  Throw away empty disposable pens in the regular trash. Questions to ask your health care provider  How often should I be taking insulin?  How often should I check my blood glucose?  What amount of insulin should I be taking at each time?  What are the side effects?  What should I do if my blood glucose is too high?  What should I do if my blood glucose is too low?  What should I do if I forget to take my insulin?  What number should I call if I have questions? Where to find more information  American Diabetes Association (ADA): www.diabetes.org  American Association of Diabetes Educators (AADE) Patient Resources: https://www.diabeteseducator.org Summary  A subcutaneous injection is a shot of medicine  that is injected into the layer of fat and tissue between skin and muscle.  Before you give yourself an insulin injection, be sure to test your blood sugar level (blood glucose level) and write down that number.  Check the expiration date and the type of insulin that is in the pen. The type of insulin that you take may determine how many injections you give yourself and when you need to give the injections.  It is best to inject insulin into the same body area each time (for example, always in the abdomen), but you should use a different spot in that area for each injection. This information is not intended to replace advice given to you by your health care  provider. Make sure you discuss any questions you have with your health care provider. Document Revised: 12/06/2017 Document Reviewed: 12/20/2015 Elsevier Patient Education  [employer].   If you have lab work done today you will be contacted with your lab results within the next 2 weeks.  If you have not heard from [place] then please contact us. The fastest way to get your results is to register for My Chart.   IF you received an x-ray today, you will receive an invoice from [facility]. Please contact [facility] at [phone] with questions or concerns regarding your invoice.   IF you received labwork today, you will receive an invoice from [place]. Please contact [facility] at [phone] with questions or concerns regarding your invoice.   Our billing staff will not be able to assist you with questions regarding bills from these companies.  You will be contacted with the lab results as soon as they are available. The fastest way to get your results is to activate your My Chart account. Instructions are located on the last page of this paperwork. If you have not heard from [place] regarding the results in 2 weeks, please contact this office.

## 2020-05-23 NOTE — Progress Notes (Signed)
Subjective:  Patient ID: Felicia Hanson, female    DOB: [birth year]  Age: 64 y.o. MRN: [id number]  CC:  Chief Complaint  Patient presents with  . Diabetes    Pt states no physical issues with her condition. pt states she has been getting high BS readings and states she thinks he glucose tester isn't working right because the pr reports she can check her BS 3 x in a row and get diffrent readings each time by large margins. pt reports she has taken her medication as directed with no known side effects.    HPI Felicia Hanson presents for   Diabetes: Complicated by hyperglycemia, uncontrolled with A1c 11.8 on May 5..  Previously had recommended higher dose of Trulicity to 1.5 mg/week, was still on lower dose.  She was continued on glipizide, Januvia 100 mg daily, unable to take Metformin due to difficulty swallowing the pills. Initial plan to start higher dose Trulicity, as well as start lisinopril as previously recommended.  On higher dose on trulicity 1.5mg  per week - only in past 2 weeks - wanted to use old supply first.  On glucotrol XL 10mg  qd. 1 missed dose since last visit.  januvia 100mg  qhs. Home readings: Fasting: 189, 234, 1-2hr PP: 382, 384, 490.  Trying to cut back on sugar.   Lowest 162.  1.5-2 meals per day.   Lab Results  Component Value Date   HGBA1C 11.8 (H) 04/03/2020   HGBA1C 8.6 (A) 10/18/2019   HGBA1C 14.0 (A) 06/26/2019   Lab Results  Component Value Date   LDLCALC 98 04/03/2020   CREATININE 0.77 04/03/2020   History of chest pain, abnormal EKG : previous abnormal EKG, evaluated by cardiology.  Echocardiogram indicated reduced left ventricular systolic function ([id number]%), nuclear stress test without underlying ischemia or scar but due to dilated LV cavity and reduced EF by SPECT findings consistent with presumed nonischemic cardiomyopathy.  Still has some substernal chest pain and effort related dyspnea.  Plan for cardiac catheterization, option of CT or  med approach. Continued Lopressor 50 mg twice daily, pravastatin increased to 80 mg nightly, sublingual nitroglycerin provided.  Continued on aspirin.  Last office visit with cardiology June 21.  Follow-up plan July 22.  Has not scheduled cath yet. Planning to schedule.     History Patient Active Problem List   Diagnosis Date Noted  . Type 2 diabetes mellitus without complication, without long-term current use of insulin (HCC) 10/26/2017  . Generalized anxiety disorder 01/10/2014  . Vitamin D deficiency 01/10/2014  . Essential hypertension, benign 01/10/2014  . Pure hypercholesterolemia 01/10/2014  . Hypothyroidism 01/10/2014   Past Medical History:  Diagnosis Date  . Anxiety   . Coronary artery calcification   . Depression   . Diabetes mellitus without complication (HCC)   . Hyperlipidemia   . Hypertension   . Thyroid disease    Past Surgical History:  Procedure Laterality Date  . BREAST EXCISIONAL BIOPSY Left   . CHOLECYSTECTOMY    . COLON SURGERY     diverticulitis with perforation; s/p colon resection.  . Colonoscopy    . TUBAL LIGATION     Allergies  Allergen Reactions  . Metformin And Related Anaphylaxis    Could not swallow   Prior to Admission medications   Medication Sig Start Date End Date Taking? Authorizing Provider  ACCU-CHEK AVIVA PLUS test strip daily. for testing 03/11/18  Yes [provider]  ALPRAZolam 03/10/2014) 1 MG tablet Take 0.5 mg by mouth  3 (three) times daily as needed for anxiety or sleep.  08/21/13  Yes [provider]  aspirin 81 MG tablet Take 81 mg by mouth daily.   Yes [provider]  betamethasone dipropionate 0.05 % lotion Apply 1 application topically as needed.  04/10/20  Yes [provider]  cetirizine (ZYRTEC) 10 MG tablet Take 1 tablet (10 mg total) by mouth daily. 10/18/19  Yes Doristine Bosworth, MD  Continuous Blood Gluc Receiver (DEXCOM G6 RECEIVER) DEVI 1 Device by Does not apply route every  morning. Device and supplies 04/18/20  Yes Shade Flood, MD  Dulaglutide (TRULICITY) 1.5 MG/0.5ML SOPN Inject 1.5 mg into the skin once a week. 04/18/20  Yes Shade Flood, MD  DULoxetine (CYMBALTA) 60 MG capsule TAKE 2 CAPSULES BY MOUTH DAILY 03/06/20  Yes Stallings, Zoe A, MD  glipiZIDE (GLUCOTROL XL) 10 MG 24 hr tablet TAKE 1 TABLET (10 MG TOTAL) BY MOUTH DAILY WITH BREAKFAST. 04/28/20  Yes Shade Flood, MD  levothyroxine (SYNTHROID) 88 MCG tablet TAKE 1 TABLET (88 MCG TOTAL) BY MOUTH DAILY BEFORE BREAKFAST. 02/07/20  Yes Stallings, Zoe A, MD  metoprolol tartrate (LOPRESSOR) 50 MG tablet TAKE 1 TABLET BY MOUTH TWICE A DAY 03/14/20  Yes Stallings, Zoe A, MD  nitroGLYCERIN (NITROSTAT) 0.4 MG SL tablet Place 1 tablet (0.4 mg total) under the tongue every 5 (five) minutes as needed for chest pain. If you require more than two tablets five minutes apart go to the nearest ER via EMS. 05/20/20 06/19/20 Yes Tolia, Sunit, DO  ondansetron (ZOFRAN ODT) 4 MG disintegrating tablet Take 1 tablet (4 mg total) by mouth every 8 (eight) hours as needed for nausea or vomiting. 07/14/19  Yes Collie Siad A, MD  pravastatin (PRAVACHOL) 80 MG tablet Take 1 tablet (80 mg total) by mouth every evening. 05/20/20 08/18/20 Yes Tolia, Sunit, DO  sitaGLIPtin (JANUVIA) 100 MG tablet Take 1 tablet (100 mg total) by mouth daily. 04/18/20  Yes Shade Flood, MD  triamcinolone cream (KENALOG) 0.5 % Apply 1 application topically 3 (three) times daily. Apply to back and forearms, avoid the face 10/18/19  Yes Stallings, Zoe A, MD  Vitamin D, Ergocalciferol, (DRISDOL) 1.25 MG (50000 UNIT) CAPS capsule Take 1 capsule (50,000 Units total) by mouth every 7 (seven) days. 04/18/20  Yes Shade Flood, MD  lisinopril (ZESTRIL) 2.5 MG tablet TAKE 1 TABLET BY MOUTH EVERY DAY Patient not taking: Reported on 05/23/2020 05/01/20   Shade Flood, MD   Social History   Socioeconomic History  . Marital status: Married    Spouse name:  Not on file  . Number of children: 2  . Years of education: Not on file  . Highest education level: Not on file  Occupational History  . Not on file  Tobacco Use  . Smoking status: Current Every Day Smoker    Packs/day: 0.75    Years: 38.00    Pack years: 28.50    Types: Cigarettes  . Smokeless tobacco: Never Used  Vaping Use  . Vaping Use: Former  Substance and Sexual Activity  . Alcohol use: No    Alcohol/week: 0.0 standard drinks  . Drug use: No  . Sexual activity: Yes  Other Topics Concern  . 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 Difficulty of Paying Living Expenses:   Food Insecurity:   . Worried About Programme researcher, broadcasting/film/video in the Last Year:   . Barista in the Last Year:   Transportation Needs:   . Freight forwarder (Medical):   Marland Kitchen Lack of Transportation (Non-Medical):   Physical Activity:   . Days of Exercise per Week:   . Minutes of Exercise per Session:   Stress:   . Feeling of Stress :   Social Connections:   . Frequency of Communication with Friends and Family:   . Frequency of Social Gatherings with Friends and Family:   . Attends Religious Services:   . Active Member of Clubs or Organizations:   . Attends Banker Meetings:   Marland Kitchen Marital Status:   Intimate Partner Violence:   . Fear of Current or Ex-Partner:   . Emotionally Abused:   Marland Kitchen Physically Abused:   . Sexually Abused:     Review of Systems Per HPI.   Objective:   Vitals:   05/23/20 1435 05/23/20 1440  BP: (!) 161/91 (!) 142/92  Pulse: 95   Temp: 97.7 F (36.5 C)   TempSrc: Temporal   SpO2: 95%   Weight: 166 lb (75.3 kg)   Height: 5' (1.524 m)      Physical Exam Vitals reviewed.  Constitutional:       Appearance: She is well-developed.  HENT:     Head: Normocephalic and atraumatic.  Eyes:     Conjunctiva/sclera: Conjunctivae normal.     Pupils: Pupils are equal, round, and reactive to light.  Neck:     Vascular: No carotid bruit.  Cardiovascular:     Rate and Rhythm: Normal rate and regular rhythm.     Heart sounds: Normal heart sounds.  Pulmonary:     Effort: Pulmonary effort is normal.     Breath sounds: Normal breath sounds.  Abdominal:     Palpations: Abdomen is soft. There is no pulsatile mass.     Tenderness: There is no abdominal tenderness.  Skin:    General: Skin is warm and dry.  Neurological:     Mental Status: She is alert and oriented to person, place, and time.  Psychiatric:        Behavior: Behavior normal.      Results for orders placed or performed in visit on 05/23/20  POCT glucose (manual entry)  Result Value Ref Range   POC Glucose 371 (A) 70 - 99 mg/dl     Assessment & Plan:  RON JUNCO is a 64 y.o. female . Uncontrolled type 2 diabetes mellitus with hyperglycemia (HCC) - Plan: POCT glucose (manual entry), insulin glargine (LANTUS SOLOSTAR) 100 UNIT/ML Solostar Pen  -Still persistent high readings especially in the afternoon.  Unlikely will significantly improve with current regimen.  Ultimately decided to start insulin, Lantus 10 units per night initially, stop glipizide for now.  Increase by 2 units every 3 days until readings below 200.  Hypoglycemic precautions given.  Continue Januvia, Trulicity.  Understanding of plan expressed along with teach back approach.  Special screening for malignant neoplasms, colon - Plan: Ambulatory referral to Gastroenterology  Left bundle branch block (LBBB) Chest pain, unspecified type  -Now followed by cardiology, likely plans to proceed with cardiac catheterization likely.  ER  precautions.  Meds ordered this encounter  Medications  . insulin glargine (LANTUS SOLOSTAR) 100 UNIT/ML Solostar Pen    Sig:  Inject 10 Units into the skin daily.    Dispense:  6 mL    Refill:  2   Patient Instructions    Stop glipizide. Start lantus once per night. 10 units per night initially. Increase by 2 units every 3 days until blood sugars are below 200.  Do not skip meals. Return to prior dose if any low readings.  Return to the clinic or go to the nearest emergency room if any of your symptoms worsen or new symptoms occur. Continue trulicity and [place].  Recheck in 2 weeks.    Insulin Injection Instructions, Using Insulin Pens, Adult A subcutaneous injection is a shot of medicine that is injected into the layer of fat and tissue between skin and muscle. People with type 1 diabetes must take insulin because their bodies do not make it. People with type 2 diabetes may need to take insulin. There are many different types of insulin. The type of insulin that you take may determine how many injections you give yourself and when you need to give the injections. Supplies needed:  Soap and water to wash hands.  Your insulin pen.  A new, unused needle.  Alcohol wipes.  A disposal container that is meant for sharp items (sharps container), such as an empty plastic bottle with a cover. How to choose a site for injection The body absorbs insulin differently, depending on where the insulin is injected (injection site). It is best to inject insulin into the same body area each time (for example, always in the abdomen), but you should use a different spot in that area for each injection. Do not inject the insulin in the same spot each time. There are five main areas that can be used for injecting. These areas include:  Abdomen. This is the preferred area.  Front of thigh.  Upper, outer side of thigh.  Upper, outer side of arm.  Upper, outer part of buttock. How to use an insulin pen  First, follow the steps for Get ready, then continue with the steps for Inject the insulin. Get ready 1. Wash your hands  with soap and water. If soap and water are not available, use hand sanitizer. 2. Before you give yourself an insulin injection, be sure to test your blood sugar level (blood glucose level) and write down that number. Follow any instructions from your health care provider about what to do if your blood glucose level is higher or lower than your normal range. 3. Check the expiration date and the type of insulin that is in the pen. 4. If you are using CLEAR insulin, check to see that it is clear and free of clumps. 5. If you are using CLOUDY insulin, do not shake the pen to get the injection ready. Instead, get it ready in one of these ways: ? Gently roll the pen between your palms several times. ? Tip the pen up and down several times. 6. Remove the cap from the insulin pen. 7. Use an alcohol wipe to clean the rubber tip of the pen. 8. Remove the protective paper tab from the disposable needle. Do not let the needle touch anything. 9. Screw a new, unused needle onto the pen. 10. Remove the outer plastic needle cover. Do not throw away the outer plastic cover yet. ? If the pen uses a special safety needle, leave the  inner needle shield in place. ? If the pen does not use a special safety needle, remove the inner plastic cover from the needle. 11. Follow the manufacturer's instructions to prime the insulin pen with the volume of insulin needed. Hold the pen with the needle pointing up, and push the button on the opposite end of the pen until a drop of insulin appears at the needle tip. If no insulin appears, repeat this step. 12. Turn the button (dial) to the number of units of insulin that you will be injecting. Inject the insulin 1. Use an alcohol wipe to clean the site where you will be injecting the needle. Let the site air-dry. 2. Hold the pen in the palm of your writing hand like a pencil. 3. If directed by your health care provider, use your other hand to pinch and hold about an inch (2.5 cm)  of skin at the injection site. Do not directly touch the cleaned part of the skin. 4. Gently but quickly, use your writing hand to put the needle straight into the skin. The needle should be at a 90-degree angle (perpendicular) to the skin. 5. When the needle is completely inserted into the skin, use your thumb or index finger of your writing hand to push the top button of the pen down all the way to inject the insulin. 6. Let go of the skin that you are pinching. Continue to hold the pen in place with your writing hand. 7. Wait 10 seconds, then pull the needle straight out of the skin. This will allow all of the insulin to go from the pen and needle into your body. 8. Carefully put the larger (outer) plastic cover of the needle back over the needle, then unscrew the capped needle and discard it in a sharps container, such as an empty plastic bottle with a cover. 9. Put the plastic cap back on the insulin pen. How to throw away supplies  Discard all used needles in a puncture-proof sharps disposal container. You can ask your local pharmacy about where you can get this kind of disposal container, or you can use an empty plastic liquid laundry detergent bottle that has a cover.  Follow the disposal regulations for the area where you live. Do not use any needle more than one time.  Throw away empty disposable pens in the regular trash. Questions to ask your health care provider  How often should I be taking insulin?  How often should I check my blood glucose?  What amount of insulin should I be taking at each time?  What are the side effects?  What should I do if my blood glucose is too high?  What should I do if my blood glucose is too low?  What should I do if I forget to take my insulin?  What number should I call if I have questions? Where to find more information  American Diabetes Association (ADA): www.diabetes.org  American Association of Diabetes Educators (AADE) Patient  Resources: https://www.diabeteseducator.org Summary  A subcutaneous injection is a shot of medicine that is injected into the layer of fat and tissue between skin and muscle.  Before you give yourself an insulin injection, be sure to test your blood sugar level (blood glucose level) and write down that number.  Check the expiration date and the type of insulin that is in the pen. The type of insulin that you take may determine how many injections you give yourself and when you need to give  the injections.  It is best to inject insulin into the same body area each time (for example, always in the abdomen), but you should use a different spot in that area for each injection. This information is not intended to replace advice given to you by your health care provider. Make sure you discuss any questions you have with your health care provider. Document Revised: 12/06/2017 Document Reviewed: 12/20/2015 Elsevier Patient Education  [employer].   If you have lab work done today you will be contacted with your lab results within the next 2 weeks.  If you have not heard from [place] then please contact us. The fastest way to get your results is to register for My Chart.   IF you received an x-ray today, you will receive an invoice from [facility]. Please contact [facility] at [phone] with questions or concerns regarding your invoice.   IF you received labwork today, you will receive an invoice from [facility]. Please contact [facility] at [phone] with questions or concerns regarding your invoice.   Our billing staff will not be able to assist you with questions regarding bills from these companies.  You will be contacted with the lab results as soon as they are available. The fastest way to get your results is to activate your My Chart account. Instructions are located on the last page of this paperwork. If you have not heard from [place] regarding the results in 2 weeks,  please contact this office.         Signed, Merri Ray, MD [facility]

## 2020-05-24 ENCOUNTER — Other Ambulatory Visit: Payer: Self-pay

## 2020-05-24 ENCOUNTER — Telehealth: Payer: Self-pay | Admitting: Family Medicine

## 2020-05-24 DIAGNOSIS — E1165 Type 2 diabetes mellitus with hyperglycemia: Secondary | ICD-10-CM

## 2020-05-24 MED ORDER — INSULIN PEN NEEDLE 31G X 5 MM MISC: 1.0000 | Freq: Every day | 2 refills | Status: DC

## 2020-05-24 NOTE — Telephone Encounter (Signed)
Patient requesting needles that go with her insulin, patient would like request sent in today.Patient would like to speak with nurse when completed  [id number] [place], [place] - [address] Phone:  [phone]  Fax:  [phone]

## 2020-05-24 NOTE — Telephone Encounter (Signed)
I see the pen on medication list- but do not see the pen needles- patient will need new Rx

## 2020-05-27 ENCOUNTER — Ambulatory Visit: Payer: Medicare Other | Admitting: Cardiology

## 2020-05-29 ENCOUNTER — Ambulatory Visit: Payer: Medicare Other | Admitting: Cardiology

## 2020-06-08 ENCOUNTER — Other Ambulatory Visit: Payer: Self-pay | Admitting: Cardiology

## 2020-06-08 DIAGNOSIS — R9439 Abnormal result of other cardiovascular function study: Secondary | ICD-10-CM

## 2020-06-10 ENCOUNTER — Ambulatory Visit (INDEPENDENT_AMBULATORY_CARE_PROVIDER_SITE_OTHER): Payer: Medicare Other | Admitting: Family Medicine

## 2020-06-10 ENCOUNTER — Other Ambulatory Visit: Payer: Self-pay

## 2020-06-10 ENCOUNTER — Encounter: Payer: Self-pay | Admitting: Family Medicine

## 2020-06-10 VITALS — BP 140/88 | HR 90 | Temp 97.7°F | Ht 60.0 in | Wt 167.0 lb

## 2020-06-10 DIAGNOSIS — F418 Other specified anxiety disorders: Secondary | ICD-10-CM | POA: Diagnosis not present

## 2020-06-10 DIAGNOSIS — R110 Nausea: Secondary | ICD-10-CM | POA: Diagnosis not present

## 2020-06-10 DIAGNOSIS — I10 Essential (primary) hypertension: Secondary | ICD-10-CM

## 2020-06-10 DIAGNOSIS — E1165 Type 2 diabetes mellitus with hyperglycemia: Secondary | ICD-10-CM

## 2020-06-10 LAB — GLUCOSE, POCT (MANUAL RESULT ENTRY): POC Glucose: 299 mg/dl — AB (ref 70–99)

## 2020-06-10 MED ORDER — METOPROLOL TARTRATE 50 MG PO TABS: 50.0000 mg | ORAL_TABLET | Freq: Two times a day (BID) | ORAL | 0 refills | Status: DC

## 2020-06-10 MED ORDER — LOSARTAN POTASSIUM 25 MG PO TABS
25.0000 mg | ORAL_TABLET | Freq: Every day | ORAL | 1 refills | Status: DC
Start: 2020-06-10 — End: 2020-06-21

## 2020-06-10 NOTE — Progress Notes (Signed)
Subjective:  Patient ID: Felicia Hanson, female    DOB: December 05, 1955  Age: 64 y.o. MRN: [id number]  CC:  Chief Complaint  Patient presents with  . Diabetes    pt reports no physical issues with this condition. pt states she hasn't started taking the insulin yet because she can't find any one to give it to her. pt states she is still taking her glipizide since she hasn't started the isulin yet.     HPI Asa MALEKA CONTINO presents for   Diabetes: Complicated by hyperglycemia.  Last visit June 24.  On higher dose of Trulicity at that time.  2 weeks.  Was continuing Glucotrol XL 10 mg daily, Januvia 100 mg nightly, home readings still in the 300s up to 400 postprandial with fastings 189, 234. Decided to stop glipizide, initiated Lantus 10 units per night initially with option to increase by 2 units every 3 days until readings below 200.    Has not started lantus yet, had concern she may get air bubbles Has nurses that live by her, she planned on having them help, but they are out of town.  Did not call here for assistance, did not bring here today.  She has continue glipizide, januvia and trulicity.  No n/v/abd pain. Still having blurry vision- same. Brief nausea daily, but no vomiting. Home readings: 290-310 over past week. Last 400 about 2 weeks ago.  Not interested in diabetic educator.   Lab Results  Component Value Date   HGBA1C 11.8 (H) 04/03/2020   HGBA1C 8.6 (A) 10/18/2019   HGBA1C 14.0 (A) 06/26/2019   Lab Results  Component Value Date   LDLCALC 98 04/03/2020   CREATININE 0.77 04/03/2020   Depression/anxiety: Followed by psychiatry - Dr. Toy Care.  Has not met with therapist. Numbers provided in May. Did not call. Would like numbers again.  Denies suicidal or homicidal ideation.   Hypertension: Lisinopril 2.'5mg'$  qd. Metoprolol '50mg'$  BID. No missed doses of meds.  Home readings: none.  Smoker. Some dry cough since starting lisinopril at times.  BP Readings from Last 3  Encounters:  06/10/20 140/88  05/23/20 (!) 142/92  05/20/20 (!) 149/77   Lab Results  Component Value Date   CREATININE 0.77 04/03/2020  Has cardiologist appt next week to decide on heart cath.  No new chest pain.     History Patient Active Problem List   Diagnosis Date Noted  . Type 2 diabetes mellitus without complication, without long-term current use of insulin ([place]) 10/26/2017  . Generalized anxiety disorder 01/10/2014  . Vitamin D deficiency 01/10/2014  . Essential hypertension, benign 01/10/2014  . Pure hypercholesterolemia 01/10/2014  . Hypothyroidism 01/10/2014   Past Medical History:  Diagnosis Date  . Anxiety   . Coronary artery calcification   . Depression   . Diabetes mellitus without complication ([place])   . Hyperlipidemia   . Hypertension   . Thyroid disease    Past Surgical History:  Procedure Laterality Date  . BREAST EXCISIONAL BIOPSY Left   . CHOLECYSTECTOMY    . COLON SURGERY     diverticulitis with perforation; s/p colon resection.  . Colonoscopy    . TUBAL LIGATION     Allergies  Allergen Reactions  . Metformin And Related Anaphylaxis    Could not swallow   Prior to Admission medications   Medication Sig Start Date End Date Taking? Authorizing Provider  ACCU-CHEK AVIVA PLUS test strip daily. for testing 03/11/18  Yes [provider]  ALPRAZolam Duanne Moron) 1  MG tablet Take 0.5 mg by mouth 3 (three) times daily as needed for anxiety or sleep.  08/21/13  Yes [provider]  aspirin 81 MG tablet Take 81 mg by mouth daily.   Yes [provider]  betamethasone dipropionate 0.05 % lotion Apply 1 application topically as needed.  04/10/20  Yes [provider]  cetirizine (ZYRTEC) 10 MG tablet Take 1 tablet (10 mg total) by mouth daily. 10/18/19  Yes Forrest Moron, MD  Continuous Blood Gluc Receiver (DEXCOM G6 RECEIVER) DEVI 1 Device by Does not apply route every morning. Device and supplies 04/18/20  Yes Wendie Agreste, MD  Dulaglutide (TRULICITY) 1.5 UV/2.5DG SOPN Inject 1.5 mg into the skin once a week. 04/18/20  Yes Wendie Agreste, MD  DULoxetine (CYMBALTA) 60 MG capsule TAKE 2 CAPSULES BY MOUTH DAILY 03/06/20  Yes Stallings, Zoe A, MD  Insulin Pen Needle 31G X 5 MM MISC Inject 1 each into the skin daily. 05/24/20  Yes Wendie Agreste, MD  levothyroxine (SYNTHROID) 88 MCG tablet TAKE 1 TABLET (88 MCG TOTAL) BY MOUTH DAILY BEFORE BREAKFAST. 02/07/20  Yes Stallings, Zoe A, MD  lisinopril (ZESTRIL) 2.5 MG tablet TAKE 1 TABLET BY MOUTH EVERY DAY 05/01/20  Yes Wendie Agreste, MD  metoprolol tartrate (LOPRESSOR) 50 MG tablet Take 1 tablet (50 mg total) by mouth 2 (two) times daily. 06/10/20  Yes Wendie Agreste, MD  nitroGLYCERIN (NITROSTAT) 0.4 MG SL tablet PLACE 1 TABLET UNDER THE TONGUE EVERY 5 MINUTES AS NEEDED FOR CHEST PAIN. IF YOU REQUIRE MORE THAN TWO TABLETS FIVE MINUTES APART GO TO THE NEAREST ER VIA EMS. 06/10/20  Yes Tolia, Sunit, DO  ondansetron (ZOFRAN ODT) 4 MG disintegrating tablet Take 1 tablet (4 mg total) by mouth every 8 (eight) hours as needed for nausea or vomiting. 07/14/19  Yes Delia Chimes A, MD  pravastatin (PRAVACHOL) 80 MG tablet Take 1 tablet (80 mg total) by mouth every evening. 05/20/20 08/18/20 Yes Tolia, Sunit, DO  sitaGLIPtin (JANUVIA) 100 MG tablet Take 1 tablet (100 mg total) by mouth daily. 04/18/20  Yes Wendie Agreste, MD  triamcinolone cream (KENALOG) 0.5 % Apply 1 application topically 3 (three) times daily. Apply to back and forearms, avoid the face 10/18/19  Yes Stallings, Zoe A, MD  Vitamin D, Ergocalciferol, (DRISDOL) 1.25 MG (50000 UNIT) CAPS capsule Take 1 capsule (50,000 Units total) by mouth every 7 (seven) days. 04/18/20  Yes Wendie Agreste, MD  insulin glargine (LANTUS SOLOSTAR) 100 UNIT/ML Solostar Pen Inject 10 Units into the skin daily. Patient not taking: Reported on 06/10/2020 05/23/20   Wendie Agreste, MD   Social History   Socioeconomic History   . Marital status: Married    Spouse name: Not on file  . Number of children: 2  . Years of education: Not on file  . Highest education level: Not on file  Occupational History  . Not on file  Tobacco Use  . Smoking status: Current Every Day Smoker    Packs/day: 0.75    Years: 38.00    Pack years: 28.50    Types: Cigarettes  . Smokeless tobacco: Never Used  Vaping Use  . Vaping Use: Former  Substance and Sexual Activity  . Alcohol use: No    Alcohol/week: 0.0 standard drinks  . Drug use: No  . Sexual activity: Yes  Other Topics Concern  . 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 Difficulty of Paying Living Expenses:   Food Insecurity:   . Worried About Charity fundraiser in the Last Year:   . Arboriculturist in the Last Year:   Transportation Needs:   . Film/video editor (Medical):   Marland Kitchen Lack of Transportation (Non-Medical):   Physical Activity:   . Days of Exercise per Week:   . Minutes of Exercise per Session:   Stress:   . Feeling of Stress :   Social Connections:   . Frequency of Communication with Friends and Family:   . Frequency of Social Gatherings with Friends and Family:   . Attends Religious Services:   . Active Member of Clubs or Organizations:   . Attends Archivist Meetings:   Marland Kitchen Marital Status:   Intimate Partner Violence:   . Fear of Current or Ex-Partner:   . Emotionally Abused:   Marland Kitchen Physically Abused:   . Sexually Abused:     Review of Systems Per HPI.   Objective:   Vitals:   06/10/20 1557 06/10/20 1607  BP: (!) 149/88 140/88  Pulse: 90   Temp: 97.7 F (36.5 C)   TempSrc: Temporal   SpO2: 94%   Weight: 167 lb (75.8 kg)   Height: 5' (1.524 m)      Physical Exam  Vitals reviewed.  Constitutional:      Appearance: She is well-developed.  HENT:     Head: Normocephalic and atraumatic.  Eyes:     Conjunctiva/sclera: Conjunctivae normal.     Pupils: Pupils are equal, round, and reactive to light.  Neck:     Vascular: No carotid bruit.  Cardiovascular:     Rate and Rhythm: Normal rate and regular rhythm.     Heart sounds: Normal heart sounds.  Pulmonary:     Effort: Pulmonary effort is normal.     Breath sounds: Normal breath sounds.  Abdominal:     Palpations: Abdomen is soft. There is no pulsatile mass.     Tenderness: There is no abdominal tenderness.  Skin:    General: Skin is warm and dry.  Neurological:     Mental Status: She is alert and oriented to person, place, and time.  Psychiatric:        Behavior: Behavior normal.      Results for orders placed or performed in visit on 06/10/20  POCT glucose (manual entry)  Result Value Ref Range   POC Glucose 299 (A) 70 - 99 mg/dl     Assessment & Plan:  LAMISHA ROUSSELL is a 64 y.o. female . Uncontrolled type 2 diabetes mellitus with hyperglycemia ([facility]) - Plan: Basic metabolic panel, POCT glucose (manual entry)  -Unfortunately has not started insulin due to concerns as above.  In office teaching with staff member including demonstration device, understanding expressed and does feel comfortable using Lantus at this time.  Start 10 units as previously discussed with increase by 2 units every 3 days until readings below 200.  Recheck in 1 week  Essential hypertension, benign  -Dry cough noted with lisinopril, possible ACE inhibitor cough.  Stop lisinopril, start low-dose losartan, recheck 1 week  Nausea - Plan:  Basic metabolic panel  -No vomiting, but will check a BMP with hyperglycemia to evaluate bicarb, with hyperglycemia as above.  ER precautions given.  Depression with anxiety  -Does report feeling overwhelmed.  Denies SI as psychiatrist.  Phone numbers requested again for  counseling, provided counseling numbers as well as urgent numbers if needed.  Recheck 1 week  Meds ordered this encounter  Medications  . losartan (COZAAR) 25 MG tablet    Sig: Take 1 tablet (25 mg total) by mouth daily.    Dispense:  30 tablet    Refill:  1   Patient Instructions   If any questions about medicine in the future, please reach out to [place] to help. We can even schedule a nurse visit to help with your medications as well.   Stop lisinopril, start losartan for blood pressure. If cough does not resolve - return to discuss further.  Return to the clinic or go to the nearest emergency room if any of your symptoms worsen or new symptoms occur.  Blood sugar 299 today.  Start Lantus as previously prescribed.  Do not take glipizide.  Start Lantus tonight 10 units once per day.  Check blood sugars and if those remain over 200 in the next 3 days, then you can increase to 12 units once per day.   Recheck with me in 1 week.  If any worsening nausea, abdominal pain or vomiting - go to emergency room. That could be a complication with your diabetes.    [facility] phone number is [phone]  Monday - Friday Only 8:30am to 5:00 pm    Afterhours Crisis number is [facility]   [address], [place], [place] [address] [phone]         Here are a few options for counseling:  [place]:  [place] [phone]      If you have lab work done today you will be contacted with your lab results within the next 2 weeks.  If you have not heard from [place] then please contact us. The fastest way to get your results is to register for My Chart.   IF you received an x-ray today, you will receive an invoice from [facility]. Please contact [facility] at [phone] with questions or concerns regarding your invoice.   IF you received labwork today,  you will receive an invoice from [place]. Please contact [facility] at [phone] with questions or concerns regarding your invoice.   Our billing staff will not be able to assist you with questions regarding bills from these companies.  You will be contacted with the lab results as soon as they are available. The fastest way to get your results is to activate your My Chart account. Instructions are located on the last page of this paperwork. If you have not heard from [place] regarding the results in 2 weeks, please contact this office.         Signed, Merri Ray, MD [facility]

## 2020-06-10 NOTE — Patient Instructions (Addendum)
If any questions about medicine in the future, please reach out to [place] to help. We can even schedule a nurse visit to help with your medications as well.   Stop lisinopril, start losartan for blood pressure. If cough does not resolve - return to discuss further.  Return to the clinic or go to the nearest emergency room if any of your symptoms worsen or new symptoms occur.  Blood sugar 299 today.  Start Lantus as previously prescribed.  Do not take glipizide.  Start Lantus tonight 10 units once per day.  Check blood sugars and if those remain over 200 in the next 3 days, then you can increase to 12 units once per day.   Recheck with me in 1 week.  If any worsening nausea, abdominal pain or vomiting - go to emergency room. That could be a complication with your diabetes.    [facility] phone number is [phone]  Monday - Friday Only 8:30am to 5:00 pm    Afterhours Crisis number is [phone] [facility]   [address], [place], [place] [address] [phone]         Here are a few options for counseling:  [facility]:  [phone]  [facility] [phone]      If you have lab work done today you will be contacted with your lab results within the next 2 weeks.  If you have not heard from [place] then please contact us. The fastest way to get your results is to register for My Chart.   IF you received an x-ray today, you will receive an invoice from [facility]. Please contact [facility] at [phone] with questions or concerns regarding your invoice.   IF you received labwork today, you will receive an invoice from [facility]. Please contact [facility] at [phone] with questions or concerns regarding your invoice.   Our billing staff will not be able to assist you with questions regarding bills from these companies.  You will be contacted with the lab results as soon  as they are available. The fastest way to get your results is to activate your My Chart account. Instructions are located on the last page of this paperwork. If you have not heard from [place] regarding the results in 2 weeks, please contact this office.

## 2020-06-11 LAB — BASIC METABOLIC PANEL
BUN/Creatinine Ratio: 11 — ABNORMAL LOW (ref 12–28)
BUN: 8 mg/dL (ref 8–27)
CO2: 23 mmol/L (ref 20–29)
Calcium: 10 mg/dL (ref 8.7–10.3)
Chloride: 93 mmol/L — ABNORMAL LOW (ref 96–106)
Creatinine, Ser: 0.76 mg/dL (ref 0.57–1.00)
GFR calc Af Amer: 95 mL/min/{1.73_m2} (ref >59)
GFR calc non Af Amer: 83 mL/min/{1.73_m2} (ref >59)
Glucose: 293 mg/dL — ABNORMAL HIGH (ref 65–99)
Potassium: 4.6 mmol/L (ref 3.5–5.2)
Sodium: 131 mmol/L — ABNORMAL LOW (ref 134–144)

## 2020-06-14 ENCOUNTER — Telehealth: Payer: Self-pay | Admitting: Family Medicine

## 2020-06-14 DIAGNOSIS — E1165 Type 2 diabetes mellitus with hyperglycemia: Secondary | ICD-10-CM

## 2020-06-14 DIAGNOSIS — F418 Other specified anxiety disorders: Secondary | ICD-10-CM

## 2020-06-14 MED ORDER — SITAGLIPTIN PHOSPHATE 100 MG PO TABS: 100.0000 mg | ORAL_TABLET | Freq: Every day | ORAL | 1 refills | Status: DC

## 2020-06-14 MED ORDER — DULOXETINE HCL 60 MG PO CPEP: 120.0000 mg | ORAL_CAPSULE | Freq: Every day | ORAL | 0 refills | Status: DC

## 2020-06-14 NOTE — Telephone Encounter (Signed)
Pt called stating that the pharmacy got in contact with her stating that they had questions regarding the medications she is taking. She is requesting to have PCP give them a call. Please advise.      [id number] [place], [place] - [address]  [address] [place] [address]  Phone: [phone] Fax: [phone]  Hours: Not open 24 hours

## 2020-06-20 ENCOUNTER — Ambulatory Visit: Payer: Medicare Other | Admitting: Cardiology

## 2020-06-21 ENCOUNTER — Ambulatory Visit: Payer: Medicare Other | Admitting: Family Medicine

## 2020-06-21 ENCOUNTER — Other Ambulatory Visit: Payer: Self-pay

## 2020-06-21 ENCOUNTER — Ambulatory Visit: Payer: Medicare Other | Admitting: Cardiology

## 2020-06-21 ENCOUNTER — Encounter: Payer: Self-pay | Admitting: Cardiology

## 2020-06-21 VITALS — BP 139/66 | HR 71 | Resp 16 | Ht 60.0 in | Wt 167.0 lb

## 2020-06-21 DIAGNOSIS — I2510 Atherosclerotic heart disease of native coronary artery without angina pectoris: Secondary | ICD-10-CM | POA: Diagnosis not present

## 2020-06-21 DIAGNOSIS — F17210 Nicotine dependence, cigarettes, uncomplicated: Secondary | ICD-10-CM

## 2020-06-21 DIAGNOSIS — R072 Precordial pain: Secondary | ICD-10-CM | POA: Diagnosis not present

## 2020-06-21 DIAGNOSIS — I10 Essential (primary) hypertension: Secondary | ICD-10-CM

## 2020-06-21 DIAGNOSIS — E119 Type 2 diabetes mellitus without complications: Secondary | ICD-10-CM

## 2020-06-21 DIAGNOSIS — R0609 Other forms of dyspnea: Secondary | ICD-10-CM | POA: Diagnosis not present

## 2020-06-21 DIAGNOSIS — R9439 Abnormal result of other cardiovascular function study: Secondary | ICD-10-CM | POA: Diagnosis not present

## 2020-06-21 DIAGNOSIS — I447 Left bundle-branch block, unspecified: Secondary | ICD-10-CM | POA: Diagnosis not present

## 2020-06-21 DIAGNOSIS — R0600 Dyspnea, unspecified: Secondary | ICD-10-CM

## 2020-06-21 DIAGNOSIS — E7800 Pure hypercholesterolemia, unspecified: Secondary | ICD-10-CM

## 2020-06-21 MED ORDER — LOSARTAN POTASSIUM 50 MG PO TABS: 50.0000 mg | ORAL_TABLET | Freq: Every evening | ORAL | 0 refills | Status: DC

## 2020-06-21 NOTE — Progress Notes (Signed)
Date:  06/21/2020   ID:  [name], DOB 1956-09-23, MRN [id number]  PCP:  Shade Flood, MD  Cardiologist:  Tessa Lerner, DO, [facility] (established care 04/22/2020)  Date: 06/21/20 Last Office Visit: 05/20/2020  Chief Complaint  Patient presents with  . Abnormal Stress Test    Re-evaluation of symptoms  . Follow-up    4 week     HPI   Felicia Hanson is a 64 y.o. female who presents to the office with a  chief complaint of " reevaluation of symptoms and to discuss abnormal stress test."  Her past medical history and cardiovascular risk factors are: uncontrolled insulin-dependent type 2 diabetes with hyperglycemia, pure hypercholesterolemia, hypertriglyceridemia, coronary artery calcification, newly diagnosed left bundle branch block, essential hypertension, hypothyroidism, tobacco use disorder, vitamin D deficiency, generalized anxiety disorder.    Patient was originally referred to the office for evaluation of chest pain at request of her primary physician.   At last office visit we discussed the results of the echo and nuclear stress test.  She continued to have symptoms of substernal chest pain not always with effort related activities but did improve with resting.  She also is having effort related dyspnea and therefore was recommended to undergo further evaluation with either coronary CTA versus left heart catheterization.  At the last office visit we increased her pravastatin and also is prescribed sublingual nitroglycerin tablets to use on as needed basis.  Since last office visit patient states that she has had one episode of chest discomfort located substernally, self-limited, associated with stressful situations around the house.she has not required the use of sublingual nitroglycerin tablet.  She is compliant with her other cardiac medications.  And recently has been transitioned to insulin therapy for her underlying uncontrolled diabetes mellitus.    Patient's effort related  dyspnea remains chronic and stable.    Smoking on a daily basis but is now down to three fourths of a pack per day.   No family history of premature coronary disease or sudden cardiac death.  Denies prior history of myocardial infarction, congestive heart failure, deep venous thrombosis, pulmonary embolism, stroke, transient ischemic attack.  FUNCTIONAL STATUS: No structured exercise program or daily routine.  But does actively take care of her grandkids.  ALLERGIES: Allergies  Allergen Reactions  . Metformin And Related Anaphylaxis    Could not swallow    MEDICATION LIST PRIOR TO VISIT: Current Meds  Medication Sig  . ACCU-CHEK AVIVA PLUS test strip daily. for testing  . ALPRAZolam (XANAX) 1 MG tablet Take 0.5 mg by mouth 3 (three) times daily as needed for anxiety or sleep.   Marland Kitchen aspirin 81 MG tablet Take 81 mg by mouth daily.  . betamethasone dipropionate 0.05 % lotion Apply 1 application topically as needed.   . cetirizine (ZYRTEC) 10 MG tablet Take 1 tablet (10 mg total) by mouth daily.  . Continuous Blood Gluc Receiver (DEXCOM G6 RECEIVER) DEVI 1 Device by Does not apply route every morning. Device and supplies  . Dulaglutide (TRULICITY) 1.5 MG/0.5ML SOPN Inject 1.5 mg into the skin once a week.  . DULoxetine (CYMBALTA) 60 MG capsule Take 2 capsules (120 mg total) by mouth daily.  . insulin glargine (LANTUS SOLOSTAR) 100 UNIT/ML Solostar Pen Inject 10 Units into the skin daily.  . Insulin Pen Needle 31G X 5 MM MISC Inject 1 each into the skin daily.  Marland Kitchen levothyroxine (SYNTHROID) 88 MCG tablet TAKE 1 TABLET (88 MCG TOTAL) BY MOUTH DAILY  BEFORE BREAKFAST.  . metoprolol tartrate (LOPRESSOR) 50 MG tablet Take 1 tablet (50 mg total) by mouth 2 (two) times daily.  . nitroGLYCERIN (NITROSTAT) 0.4 MG SL tablet PLACE 1 TABLET UNDER THE TONGUE EVERY 5 MINUTES AS NEEDED FOR CHEST PAIN. IF YOU REQUIRE MORE THAN TWO TABLETS FIVE MINUTES APART GO TO THE NEAREST ER VIA EMS.  . pravastatin  (PRAVACHOL) 80 MG tablet Take 1 tablet (80 mg total) by mouth every evening.  . sitaGLIPtin (JANUVIA) 100 MG tablet Take 1 tablet (100 mg total) by mouth daily.  Marland Kitchen triamcinolone cream (KENALOG) 0.5 % Apply 1 application topically 3 (three) times daily. Apply to back and forearms, avoid the face  . Vitamin D, Ergocalciferol, (DRISDOL) 1.25 MG (50000 UNIT) CAPS capsule Take 1 capsule (50,000 Units total) by mouth every 7 (seven) days.  . [DISCONTINUED] losartan (COZAAR) 25 MG tablet Take 1 tablet (25 mg total) by mouth daily.     PAST MEDICAL HISTORY: Past Medical History:  Diagnosis Date  . Anxiety   . Coronary artery calcification   . Depression   . Diabetes mellitus without complication (HCC)   . Hyperlipidemia   . Hypertension   . Thyroid disease     PAST SURGICAL HISTORY: Past Surgical History:  Procedure Laterality Date  . BREAST EXCISIONAL BIOPSY Left   . CHOLECYSTECTOMY    . COLON SURGERY     diverticulitis with perforation; s/p colon resection.  . Colonoscopy    . TUBAL LIGATION      FAMILY HISTORY: The patient family history includes Breast cancer in her paternal aunt; Cancer in her mother; Hyperlipidemia in her mother.  SOCIAL HISTORY:  The patient  reports that she has been smoking cigarettes. She has a 28.50 pack-year smoking history. She has never used smokeless tobacco. She reports that she does not drink alcohol and does not use drugs.  REVIEW OF SYSTEMS: Review of Systems  Constitutional: Negative for chills and fever.  HENT: Negative for hoarse voice and nosebleeds.   Eyes: Negative for discharge, double vision and pain.  Cardiovascular: Positive for chest pain and dyspnea on exertion. Negative for claudication, leg swelling, near-syncope, orthopnea, palpitations, paroxysmal nocturnal dyspnea and syncope.  Respiratory: Positive for shortness of breath. Negative for hemoptysis.   Musculoskeletal: Negative for muscle cramps and myalgias.  Gastrointestinal:  Negative for abdominal pain, constipation, diarrhea, hematemesis, hematochezia, melena, nausea and vomiting.  Neurological: Negative for dizziness and light-headedness.    PHYSICAL EXAM: Vitals with BMI 06/21/2020 06/10/2020 06/10/2020  Height 5\' 0"  - 5\' 0"   Weight 167 lbs - 167 lbs  BMI 32.62 - 32.62  Systolic 139 140  Diastolic 66 88 88  Pulse 71 - 90    CONSTITUTIONAL: Well-developed and well-nourished. No acute distress.  SKIN: Skin is warm and dry. No rash noted. No cyanosis. No pallor. No jaundice HEAD: Normocephalic and atraumatic.  EYES: No scleral icterus MOUTH/THROAT: Moist oral membranes.  NECK: No JVD present. No thyromegaly noted. No carotid bruits  LYMPHATIC: No visible cervical adenopathy.  CHEST Normal respiratory effort. No intercostal retractions  LUNGS: Clear to auscultation bilaterally.  No stridor. No wheezes. No rales.  CARDIOVASCULAR: Regular rate and rhythm, positive S1-S2, no murmurs rubs or gallops appreciated. ABDOMINAL: Obese, soft, nontender, nondistended, positive bowel sounds in all 4 quadrants.  No apparent ascites.  EXTREMITIES: No peripheral edema  HEMATOLOGIC: No significant bruising NEUROLOGIC: Oriented to person, place, and time. Nonfocal. Normal muscle tone.  PSYCHIATRIC: Normal mood and affect. Normal behavior. Cooperative  CARDIAC DATABASE:  EKG:  04/22/2020: Normal sinus rhythm, 77 bpm, left axis deviation, left bundle branch block, left atrial enlargement, poor R wave progression.  Coronary calcium scoring 05/13/2015: LAD: 8, Cx: 2. Total calcium score is 10.   Echocardiogram: 03/2015: LVEF 44%, mild LVH, mild to moderate decrease in global wall motion, grade 2 diastolic impairment, mildly dilated left atrium, mild MR and TR.  04/23/2020: Left ventricle cavity is normal in size. Moderate concentric hypertrophy of the left ventricle. Moderate global hypokinesis. LVEF 35-40%. Indeterminate diastolic function due to E/A fusion.  Moderately  dilated left atrium, mild MR.  Stress Testing: Lexiscan Tetrofosmin Stress Test 05/13/2020:  There is a fixed mild defect in the inferior region due to diaphragmatic attenuation. No ischemia or scar.  The LV is moderately dilated with LV end diastolic volume of 173 mL.  Overall LV systolic function is abnormal without regional wall motion abnormalities. Stress LV EF: 21%.   High risk study due to low LVEF. Findings suggest non ischemic dilated cardiomyopathy.  Heart Catheterization: None   LABORATORY DATA: CBC Latest Ref Rng & Units 02/03/2019 08/11/2018 02/11/2018  WBC 3.4 - 10.8 x10E3/uL 10.3 12.7(A) 11.0(H)  Hemoglobin 11.1 - 15.9 g/dL 18.5 63.1 49.7  Hematocrit 34.0 - 46.6 % 39.9 43.9 46.0  Platelets 150 - 450 x10E3/uL 217 - 268    CMP Latest Ref Rng & Units 06/10/2020 04/03/2020 10/18/2019  Glucose 65 - 99 mg/dL 026(V) 785(Y) 850(Y)  BUN 8 - 27 mg/dL 8 9 5(L)  Creatinine 7.74 - 1.00 mg/dL 1.28 7.86 7.67  Sodium 134 - 144 mmol/L 131(L) 132(L) 138  Potassium 3.5 - 5.2 mmol/L 4.6 4.7 4.8  Chloride 96 - 106 mmol/L 93(L) 93(L) 97  CO2 20 - 29 mmol/L 23 25 28   Calcium 8.7 - 10.3 mg/dL 20.9 9.8  Total Protein 6.0 - 8.5 g/dL - 8.2 7.8  Total Bilirubin 0.0 - 1.2 mg/dL - 0.4 0.6  Alkaline Phos 39 - 117 IU/L - 109 92  AST 0 - 40 IU/L - 28 31  ALT 0 - 32 IU/L - 15 12    Lipid Panel     Component Value Date/Time   CHOL 189 04/03/2020 1757   TRIG 248 (H) 04/03/2020 1757   HDL 49 04/03/2020 1757   CHOLHDL 3.9 04/03/2020 1757   CHOLHDL 5.0 08/09/2015 1504   VLDL 44 (H) 08/09/2015 1504   LDLCALC 98 04/03/2020 1757   LABVLDL 42 (H) 04/03/2020 1757   Lipid Panel Recent Labs    06/26/19 1217 10/18/19 1712 04/03/20 1757  CHOL 168 177 189  TRIG 161* 192* 248*  LDLCALC 88 97 98  HDL 48 47 49  CHOLHDL 3.5 3.8 3.9    Lab Results  Component Value Date   HGBA1C 11.8 (H) 04/03/2020   HGBA1C 8.6 (A) 10/18/2019   HGBA1C 14.0 (A) 06/26/2019   No components found for:  NTPROBNP Lab Results  Component Value Date   TSH 0.732 04/03/2020   TSH CANCELED 06/26/2019   TSH 1.180 02/03/2019    IMPRESSION:    ICD-10-CM   1. Abnormal stress test  R94.39 CBC  2. Precordial chest pain  R07.2   3. Dyspnea on exertion  R06.00   4. LBBB (left bundle branch block)  I44.7   5. Coronary artery calcification seen on computed tomography  I25.10   6. Non-insulin dependent type 2 diabetes mellitus (HCC)  E11.9   7. Benign hypertension  I10 losartan (COZAAR) 50 MG tablet  Basic metabolic panel    Magnesium    Magnesium    Basic metabolic panel  8. Hypercholesterolemia  E78.00   9. Cigarette smoker  F17.210      RECOMMENDATIONS: WEI LOHMEIER is a 64 y.o. female whose past medical history and cardiac risk factors include: Insulin-dependent diabetes mellitus type 2 with hyperglycemia, pure hypercholesterolemia, hypertriglyceridemia, coronary artery calcification, newly diagnosed left bundle branch block, essential hypertension, hypothyroidism, tobacco use disorder, vitamin D deficiency, generalized anxiety disorder.   Abnormal nuclear stress test:  Patient continues to have substernal chest pain, effort related dyspnea, more prominent during stressful situations, and does improve with rest.  The symptoms are not classic anginal symptoms it may be her anginal equivalent.  Echocardiogram noted moderately reduced left ventricular systolic function with global hypokinesis.  Nuclear stress test noted dilated left ventricular cavity and severely reduced left ventricular systolic function by gated images; however, no obvious evidence of reversible ischemia or prior scar.  We discussed undergoing either left heart catheterization and coronary CTA to evaluate for coronary disease.  Patient states that she spoke to her family members at length since last visit and she wants to proceed with left heart catheterization with possible intervention.  However, patient states that she  would like to hold off for approximately 4 weeks prior to proceeding with left heart catheterization.    The left heart catheterization procedure was explained to the patient in detail. The indication, alternatives, risks and benefits were reviewed. Complications including but not limited to bleeding, infection, acute kidney injury, blood transfusion, heart rhythm disturbances, contrast (dye) reaction, damage to the arteries or nerves in the legs or hands, cerebrovascular accident, myocardial infarction, need for emergent bypass surgery, blood clots in the legs, possible need for emergent blood transfusion, and rarely death were reviewed and discussed with the patient. The patient voices understanding and wishes to proceed.   Continue aspirin.  Continue Lopressor  Continue pravastatin  Sublingual nitroglycerin tablets using as needed basis  Increase losartan to 50 mg p.o. every afternoon.  We will check blood work in 1 week to evaluate kidney function electrolytes.  Interim, patient is educated on calling 911 and going to the closest hospital via EMS if her symptoms increase in intensity, frequency, duration, or has typical discomfort as discussed in the office.  Left bundle branch block: Monitor for now.   Coronary artery calcification: Currently on aspirin and statin therapy.  Non-insulin-dependent diabetes mellitus type 2: Most recent hemoglobin A1c reviewed.  Currently managed per primary team.  Benign essential hypertension: Currently managed by primary provider. Educated on importance of low-salt diet.  Hypertriglyceridemia: Patient may benefit from pharmacological therapy.  Will defer to primary team.  Active tobacco use: Patient is down to three fourths of a pack per day since last office visit.  Patient educated on importance of complete cessation of tobacco use.  Total encounter time 40 minutes.    FINAL MEDICATION LIST END OF ENCOUNTER: Meds ordered this encounter   Medications  . losartan (COZAAR) 50 MG tablet    Sig: Take 1 tablet (50 mg total) by mouth every evening.    Dispense:  90 tablet    Refill:  0    Medications Discontinued During This Encounter  Medication Reason  . losartan (COZAAR) 25 MG tablet Dose change     Current Outpatient Medications:  .  ACCU-CHEK AVIVA PLUS test strip, daily. for testing, Disp: , Rfl: 2 .  ALPRAZolam (XANAX) 1 MG tablet, Take 0.5 mg by  mouth 3 (three) times daily as needed for anxiety or sleep. , Disp: , Rfl:  .  aspirin 81 MG tablet, Take 81 mg by mouth daily., Disp: , Rfl:  .  betamethasone dipropionate 0.05 % lotion, Apply 1 application topically as needed. , Disp: , Rfl:  .  cetirizine (ZYRTEC) 10 MG tablet, Take 1 tablet (10 mg total) by mouth daily., Disp: 30 tablet, Rfl: 11 .  Continuous Blood Gluc Receiver (DEXCOM G6 RECEIVER) DEVI, 1 Device by Does not apply route every morning. Device and supplies, Disp: 1 each, Rfl: 0 .  Dulaglutide (TRULICITY) 1.5 MG/0.5ML SOPN, Inject 1.5 mg into the skin once a week., Disp: 6 mL, Rfl: 1 .  DULoxetine (CYMBALTA) 60 MG capsule, Take 2 capsules (120 mg total) by mouth daily., Disp: 180 capsule, Rfl: 0 .  insulin glargine (LANTUS SOLOSTAR) 100 UNIT/ML Solostar Pen, Inject 10 Units into the skin daily., Disp: 6 mL, Rfl: 2 .  Insulin Pen Needle 31G X 5 MM MISC, Inject 1 each into the skin daily., Disp: 100 each, Rfl: 2 .  levothyroxine (SYNTHROID) 88 MCG tablet, TAKE 1 TABLET (88 MCG TOTAL) BY MOUTH DAILY BEFORE BREAKFAST., Disp: 90 tablet, Rfl: 1 .  metoprolol tartrate (LOPRESSOR) 50 MG tablet, Take 1 tablet (50 mg total) by mouth 2 (two) times daily., Disp: 180 tablet, Rfl: 0 .  nitroGLYCERIN (NITROSTAT) 0.4 MG SL tablet, PLACE 1 TABLET UNDER THE TONGUE EVERY 5 MINUTES AS NEEDED FOR CHEST PAIN. IF YOU REQUIRE MORE THAN TWO TABLETS FIVE MINUTES APART GO TO THE NEAREST ER VIA EMS., Disp: 25 tablet, Rfl: 1 .  pravastatin (PRAVACHOL) 80 MG tablet, Take 1 tablet (80 mg  total) by mouth every evening., Disp: 90 tablet, Rfl: 0 .  sitaGLIPtin (JANUVIA) 100 MG tablet, Take 1 tablet (100 mg total) by mouth daily., Disp: 90 tablet, Rfl: 1 .  triamcinolone cream (KENALOG) 0.5 %, Apply 1 application topically 3 (three) times daily. Apply to back and forearms, avoid the face, Disp: 454 g, Rfl: 1 .  Vitamin D, Ergocalciferol, (DRISDOL) 1.25 MG (50000 UNIT) CAPS capsule, Take 1 capsule (50,000 Units total) by mouth every 7 (seven) days., Disp: 15 capsule, Rfl: 0 .  losartan (COZAAR) 50 MG tablet, Take 1 tablet (50 mg total) by mouth every evening., Disp: 90 tablet, Rfl: 0 .  ondansetron (ZOFRAN ODT) 4 MG disintegrating tablet, Take 1 tablet (4 mg total) by mouth every 8 (eight) hours as needed for nausea or vomiting. (Patient not taking: Reported on 06/21/2020), Disp: 20 tablet, Rfl: 1  Orders Placed This Encounter  Procedures  . Basic metabolic panel  . Magnesium  . CBC   --Continue cardiac medications as reconciled in final medication list. --Return in about 6 weeks (around 08/02/2020) for re-evaluation of symptoms and post-cath. . Or sooner if needed. --Continue follow-up with your primary care physician regarding the management of your other chronic comorbid conditions.  Patient's questions and concerns were addressed to her satisfaction. She voices understanding of the instructions provided during this encounter.   This note was created using a voice recognition software as a result there may be grammatical errors inadvertently enclosed that do not reflect the nature of this encounter. Every attempt is made to correct such errors.  Tessa Lerner, [place], [facility]  Pager: [phone] Office: [phone]

## 2020-06-24 ENCOUNTER — Ambulatory Visit (INDEPENDENT_AMBULATORY_CARE_PROVIDER_SITE_OTHER): Payer: Medicare Other | Admitting: Family Medicine

## 2020-06-24 ENCOUNTER — Other Ambulatory Visit: Payer: Self-pay

## 2020-06-24 ENCOUNTER — Encounter: Payer: Self-pay | Admitting: Family Medicine

## 2020-06-24 ENCOUNTER — Other Ambulatory Visit: Payer: Self-pay | Admitting: Family Medicine

## 2020-06-24 VITALS — BP 134/76 | HR 82 | Temp 97.8°F | Resp 15 | Ht 60.0 in | Wt 166.8 lb

## 2020-06-24 DIAGNOSIS — I10 Essential (primary) hypertension: Secondary | ICD-10-CM

## 2020-06-24 DIAGNOSIS — K5900 Constipation, unspecified: Secondary | ICD-10-CM | POA: Diagnosis not present

## 2020-06-24 DIAGNOSIS — E1165 Type 2 diabetes mellitus with hyperglycemia: Secondary | ICD-10-CM | POA: Diagnosis not present

## 2020-06-24 DIAGNOSIS — R7989 Other specified abnormal findings of blood chemistry: Secondary | ICD-10-CM

## 2020-06-24 DIAGNOSIS — F418 Other specified anxiety disorders: Secondary | ICD-10-CM | POA: Diagnosis not present

## 2020-06-24 LAB — GLUCOSE, POCT (MANUAL RESULT ENTRY): POC Glucose: 352 mg/dl — AB (ref 70–99)

## 2020-06-24 NOTE — Progress Notes (Signed)
Subjective:  Patient ID: Felicia Hanson, female    DOB: 25-Nov-1956  Age: 64 y.o. MRN: [id number]  CC:  Chief Complaint  Patient presents with  . Diabetes    pt here for 1 week recheck after starting insulin, pt is doing ok, notes no side effects at this time, pt has been taking BG at home has been better, 230 this am  . Depression    screening positive :11 pt feels "doom" a lot latley Anxiety score 9     HPI Felicia Hanson presents for   Diabetes: Complicated by hyperglycemia.  See prior office visits unfortunately had not started Lantus on her last visit July 12.  Questions and concerns were discussed at that time as well as in office teaching with one of our staff.  Expressed understanding and confidence in treating at home.  Glipizide was discontinued, continued Januvia.    Initially started on Lantus 10 units per night with option to increase to 12 units/day if persistent readings over two hundred. Additionally we stop lisinopril, start her on losartan due to a slight cough. 10 units 1st 3 days, then up to 12 units until last night - then 14 units last night.  CBG 230 this morning. Going down slowly.  Still some 300's at times, no 400's. Highest 371. No current n/v/abd pain.   Losartan once per day - cough resolved with stopping lisinopril.  No home BP readings on losartan.   BP Readings from Last 3 Encounters:  06/24/20 (!) 134/76  06/21/20 (!) 139/66  06/10/20 140/88    Constipated for awhile. Last BM last night. Hard stool last night.  Tx: probiotic, miralax on occasion- just started past 2 days.    Lab Results  Component Value Date   HGBA1C 11.8 (H) 04/03/2020   HGBA1C 8.6 (A) 10/18/2019   HGBA1C 14.0 (A) 06/26/2019   Lab Results  Component Value Date   LDLCALC 98 04/03/2020   CREATININE 0.76 06/10/2020   Positive depression screening score Treated by psychiatry, Dr. Toy Care, with Cymbalta (higher dose past year). Xanax as needed. .  See previous visits.  Have  recommended meeting with therapist previously, had not met with a therapist as of her July 12 visit.  Phone numbers were provided again.  Denied homicidal/suicidal ideation.  Next appointment with Dr. Toy Care is in 1 month.  Has not called therapist yet.  Feeling of doom, that nobody cares about her, but she does think more that children and grandchildren lover her. No suicide thoughts.    GAD 7 : Generalized Anxiety Score 06/24/2020 05/09/2020 04/03/2020 06/26/2019  Nervous, Anxious, on Edge '1 3 3 2  '$ Control/stop worrying '2 2 2 2  '$ Worry too much - different things '2 2 2 3  '$ Trouble relaxing '1 2 2 3  '$ Restless 2 2 0 2  Easily annoyed or irritable 0 '2 1 2  '$ Afraid - awful might happen '1 2 3 3  '$ Total GAD 7 Score '9 15 13 17  '$ Anxiety Difficulty - - - Very difficult      Depression screen [facility] 2/9 06/24/2020 06/10/2020 05/23/2020 05/09/2020 04/03/2020  Decreased Interest 1 0 0 1 3  Down, Depressed, Hopeless 1 0 0 1 3  PHQ - 2 Score 2 0 0 2 6  Altered sleeping 2 - - - 2  Tired, decreased energy 2 - - 2 3  Change in appetite 0 - - 1 1  Feeling bad or failure about yourself  3 - -  3 3  Trouble concentrating 1 - - 1 1  Moving slowly or fidgety/restless 1 - - 3 3  Suicidal thoughts 0 - - 0 0  PHQ-9 Score 11 - - - 19  Difficult doing work/chores - - - - -  Some recent data might be hidden       History Patient Active Problem List   Diagnosis Date Noted  . Type 2 diabetes mellitus without complication, without long-term current use of insulin ([place]) 10/26/2017  . Generalized anxiety disorder 01/10/2014  . Vitamin D deficiency 01/10/2014  . Essential hypertension, benign 01/10/2014  . Pure hypercholesterolemia 01/10/2014  . Hypothyroidism 01/10/2014   Past Medical History:  Diagnosis Date  . Anxiety   . Coronary artery calcification   . Depression   . Diabetes mellitus without complication ([place])   . Hyperlipidemia   . Hypertension   . Thyroid disease    Past Surgical History:  Procedure  Laterality Date  . BREAST EXCISIONAL BIOPSY Left   . CHOLECYSTECTOMY    . COLON SURGERY     diverticulitis with perforation; s/p colon resection.  . Colonoscopy    . TUBAL LIGATION     Allergies  Allergen Reactions  . Metformin And Related Anaphylaxis    Could not swallow  . Ace Inhibitors Cough    Cough with lisinopril.    Prior to Admission medications   Medication Sig Start Date End Date Taking? Authorizing Provider  ACCU-CHEK AVIVA PLUS test strip daily. for testing 03/11/18  Yes [provider]  ALPRAZolam Duanne Moron) 1 MG tablet Take 0.5 mg by mouth 3 (three) times daily as needed for anxiety or sleep.  08/21/13  Yes [provider]  aspirin 81 MG tablet Take 81 mg by mouth daily.   Yes [provider]  betamethasone dipropionate 0.05 % lotion Apply 1 application topically as needed.  04/10/20  Yes [provider]  cetirizine (ZYRTEC) 10 MG tablet Take 1 tablet (10 mg total) by mouth daily. 10/18/19  Yes Forrest Moron, MD  Continuous Blood Gluc Receiver (DEXCOM G6 RECEIVER) DEVI 1 Device by Does not apply route every morning. Device and supplies 04/18/20  Yes Wendie Agreste, MD  Dulaglutide (TRULICITY) 1.5 VO/3.5KK SOPN Inject 1.5 mg into the skin once a week. 04/18/20  Yes Wendie Agreste, MD  DULoxetine (CYMBALTA) 60 MG capsule Take 2 capsules (120 mg total) by mouth daily. 06/14/20  Yes Wendie Agreste, MD  insulin glargine (LANTUS SOLOSTAR) 100 UNIT/ML Solostar Pen Inject 10 Units into the skin daily. 05/23/20  Yes Wendie Agreste, MD  Insulin Pen Needle 31G X 5 MM MISC Inject 1 each into the skin daily. 05/24/20  Yes Wendie Agreste, MD  levothyroxine (SYNTHROID) 88 MCG tablet TAKE 1 TABLET (88 MCG TOTAL) BY MOUTH DAILY BEFORE BREAKFAST. 02/07/20  Yes Forrest Moron, MD  losartan (COZAAR) 50 MG tablet Take 1 tablet (50 mg total) by mouth every evening. 06/21/20 09/19/20 Yes Tolia, Sunit, DO  metoprolol tartrate (LOPRESSOR) 50 MG tablet  Take 1 tablet (50 mg total) by mouth 2 (two) times daily. 06/10/20  Yes Wendie Agreste, MD  nitroGLYCERIN (NITROSTAT) 0.4 MG SL tablet PLACE 1 TABLET UNDER THE TONGUE EVERY 5 MINUTES AS NEEDED FOR CHEST PAIN. IF YOU REQUIRE MORE THAN TWO TABLETS FIVE MINUTES APART GO TO THE NEAREST ER VIA EMS. 06/10/20  Yes Tolia, Sunit, DO  ondansetron (ZOFRAN ODT) 4 MG disintegrating tablet Take 1 tablet (4 mg total) by mouth  every 8 (eight) hours as needed for nausea or vomiting. 07/14/19  Yes Delia Chimes A, MD  pravastatin (PRAVACHOL) 80 MG tablet Take 1 tablet (80 mg total) by mouth every evening. 05/20/20 08/18/20 Yes Tolia, Sunit, DO  sitaGLIPtin (JANUVIA) 100 MG tablet Take 1 tablet (100 mg total) by mouth daily. 06/14/20  Yes Wendie Agreste, MD  triamcinolone cream (KENALOG) 0.5 % Apply 1 application topically 3 (three) times daily. Apply to back and forearms, avoid the face 10/18/19  Yes Stallings, Zoe A, MD  Vitamin D, Ergocalciferol, (DRISDOL) 1.25 MG (50000 UNIT) CAPS capsule Take 1 capsule (50,000 Units total) by mouth every 7 (seven) days. 04/18/20  Yes Wendie Agreste, MD   Social History   Socioeconomic History  . Marital status: Married    Spouse name: Not on file  . Number of children: 2  . Years of education: Not on file  . Highest education level: Not on file  Occupational History  . Not on file  Tobacco Use  . Smoking status: Current Every Day Smoker    Packs/day: 0.75    Years: 38.00    Pack years: 28.50    Types: Cigarettes  . Smokeless tobacco: Never Used  Vaping Use  . Vaping Use: Former  Substance and Sexual Activity  . Alcohol use: No    Alcohol/week: 0.0 standard drinks  . Drug use: No  . Sexual activity: Yes  Other Topics Concern  . 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 Difficulty of Paying Living Expenses:   Food Insecurity:   . Worried About Charity fundraiser in the Last Year:   . Arboriculturist in the Last Year:   Transportation Needs:   . Film/video editor (Medical):   Marland Kitchen Lack of Transportation (Non-Medical):   Physical Activity:   . Days of Exercise per Week:   . Minutes of Exercise per Session:   Stress:   . Feeling of Stress :   Social Connections:   . Frequency of Communication with Friends and Family:   . Frequency of Social Gatherings with Friends and Family:   . Attends Religious Services:   . Active Member of Clubs or Organizations:   . Attends Archivist Meetings:   Marland Kitchen Marital Status:   Intimate Partner Violence:   . Fear of Current or Ex-Partner:   . Emotionally Abused:   Marland Kitchen Physically Abused:   . Sexually Abused:     Review of Systems Per HPI.   Objective:   Vitals:   06/24/20 1508 06/24/20 1610  BP: (!) 171/82 (!) 134/76  Pulse: 82   Resp: 15   Temp: 97.8 F (36.6 C)   TempSrc: Temporal   SpO2: 96%   Weight: 166 lb 12.8 oz (75.7 kg)   Height: 5' (1.524 m)      Physical Exam Vitals reviewed.  Constitutional:      Appearance: She is well-developed.  HENT:     Head: Normocephalic and atraumatic.  Eyes:     Conjunctiva/sclera: Conjunctivae normal.     Pupils: Pupils are equal, round,  and reactive to light.  Neck:     Vascular: No carotid bruit.  Cardiovascular:     Rate and Rhythm: Normal rate and regular rhythm.     Heart sounds: Normal heart sounds.  Pulmonary:     Effort: Pulmonary effort is normal.     Breath sounds: Normal breath sounds.  Abdominal:     Palpations: Abdomen is soft. There is no pulsatile mass.     Tenderness: There is no abdominal tenderness. There is no guarding.  Skin:    General: Skin is warm and dry.  Neurological:     Mental Status: She is alert and oriented to person, place, and time.   Psychiatric:        Attention and Perception: Attention normal.        Mood and Affect: Affect normal. Mood is anxious.        Speech: Speech normal.        Behavior: Behavior normal.        Thought Content: Thought content normal. Thought content does not include homicidal or suicidal ideation.    Results for orders placed or performed in visit on 06/24/20  POCT glucose (manual entry)  Result Value Ref Range   POC Glucose 352 (A) 70 - 99 mg/dl     Assessment & Plan:  Felicia Hanson is a 64 y.o. female . Uncontrolled type 2 diabetes mellitus with hyperglycemia (HCC) - Plan: POCT glucose (manual entry)  -  Tolerating lantus, increase to 16 units tonight, then 2 unit increase every 3 days until readings under 200, then remain same dose. Continue [place].   Constipation, unspecified constipation type  - symptomatic care discussed. Handout given.   Depression with anxiety  - stressed importance of counseling, continue follow up with psychiatry.   Essential hypertension  - cough resolved. Possible ace-I cough. Stable on losartan current dose.   No orders of the defined types were placed in this encounter.  Patient Instructions   It is very important to meet with a therapist for depression and anxiety. Please let me know if other numbers needed. Call your psychiatrist if needed sooner than your scheduled appointment in 1 month.   Increased to 16 units lantus tonight then 2 additional units every 3 days until readings below 200 (then stay at that dose).   miralax up to once per day ok if needed for constipation for now.  Citrucel over the counter daily can also help prevent constipation.    Constipation, Adult Constipation is when a person has fewer bowel movements in a week than normal, has difficulty having a bowel movement, or has stools that are dry, hard, or larger than normal. Constipation may be caused by an underlying condition. It may become worse with age if a person  takes certain medicines and does not take in enough fluids. Follow these instructions at home: Eating and drinking   Eat foods that have a lot of fiber, such as fresh fruits and vegetables, whole grains, and beans.  Limit foods that are high in fat, low in fiber, or overly processed, such as french fries, hamburgers, cookies, candies, and soda.  Drink enough fluid to keep your urine clear or pale yellow. General instructions  Exercise regularly or as told by your health care provider.  Go to the restroom when you have the urge to go. Do not hold it in.  Take over-the-counter and prescription medicines only as told by your health care provider. These include any fiber supplements.  Practice pelvic floor retraining exercises, such as deep breathing while relaxing the lower abdomen and pelvic floor relaxation during bowel movements.  Watch your condition for any changes.  Keep all follow-up visits as told by your health care provider. This is important. Contact a health care provider if:  You have pain that gets worse.  You have a fever.  You do not have a bowel movement after 4 days.  You vomit.  You are not hungry.  You lose weight.  You are bleeding from the anus.  You have thin, pencil-like stools. Get help right away if:  You have a fever and your symptoms suddenly get worse.  You leak stool or have blood in your stool.  Your abdomen is bloated.  You have severe pain in your abdomen.  You feel dizzy or you faint. This information is not intended to replace advice given to you by your health care provider. Make sure you discuss any questions you have with your health care provider. Document Revised: 10/29/2017 Document Reviewed: 05/06/2016 Elsevier Patient Education  [employer].    If you have lab work done today you will be contacted with your lab results within the next 2 weeks.  If you have not heard from [place] then please contact us. The fastest way  to get your results is to register for My Chart.   IF you received an x-ray today, you will receive an invoice from [facility]. Please contact [facility] at [phone] with questions or concerns regarding your invoice.   IF you received labwork today, you will receive an invoice from [place]. Please contact [facility] at [phone] with questions or concerns regarding your invoice.   Our billing staff will not be able to assist you with questions regarding bills from these companies.  You will be contacted with the lab results as soon as they are available. The fastest way to get your results is to activate your My Chart account. Instructions are located on the last page of this paperwork. If you have not heard from [place] regarding the results in 2 weeks, please contact this office.         Signed, Merri Ray, MD [facility]

## 2020-06-24 NOTE — Telephone Encounter (Signed)
Requested medication (s) are due for refill today: yes  Requested medication (s) are on the active medication list: yes  Last refill: 04/18/2020  Future visit scheduled: yes  Notes to clinic:  this refill cannot be delegated    Requested Prescriptions  Pending Prescriptions Disp Refills   Vitamin D, Ergocalciferol, (DRISDOL) 1.25 MG (50000 UNIT) CAPS capsule [Pharmacy Med Name: VITAMIN D2 1.25MG (50,000 UNIT)] 15 capsule 0    Sig: Take 1 capsule (50,000 Units total) by mouth every 7 (seven) days.      Endocrinology:  Vitamins - Vitamin D Supplementation Failed - 06/24/2020  6:33 AM      Failed - 50,000 IU strengths are not delegated      Failed - Phosphate in normal range and within 360 days    No results found for: PHOS        Failed - Vitamin D in normal range and within 360 days    Vit D, 25-Hydroxy  Date Value Ref Range Status  04/03/2020 10.9 (L) 30.0 - 100.0 ng/mL Final    Comment:    Vitamin D deficiency has been defined by the Institute of Medicine and an Endocrine Society practice guideline as a level of serum 25-OH vitamin D less than 20 ng/mL (1,2). The Endocrine Society went on to further define vitamin D insufficiency as a level between 21 and 29 ng/mL (2). 1. IOM (Institute of Medicine). 2010. Dietary reference    intakes for calcium and D. [place]: The    [employer]. 2. Holick MF, Binkley [place], Bischoff-Ferrari HA, et al.    Evaluation, treatment, and prevention of vitamin D    deficiency: an Endocrine Society clinical practice    guideline. JCEM. 2011 Jul; 96(7):1911-30.           Passed - Ca in normal range and within 360 days    Calcium  Date Value Ref Range Status  06/10/2020 10.0 8.7 - 10.3 mg/dL Final          Passed - Valid encounter within last 12 months    Recent Outpatient Visits           2 weeks ago Uncontrolled type 2 diabetes mellitus with hyperglycemia [facility])   [facility] [place], Asencion Partridge, MD   1 month  ago Uncontrolled type 2 diabetes mellitus with hyperglycemia [facility])   [facility] [place], Asencion Partridge, MD   1 month ago Uncontrolled type 2 diabetes mellitus with hyperglycemia [facility])   [facility] [place], Asencion Partridge, MD   2 months ago Low serum vitamin D   [facility] [place], Asencion Partridge, MD   2 months ago Uncontrolled type 2 diabetes mellitus with hyperglycemia [facility])   [facility] [place], Asencion Partridge, MD       Future Appointments             Today Shade Flood, MD [facility] [place], [facility]

## 2020-06-24 NOTE — Telephone Encounter (Signed)
Pt does have an appointment today. Please advise if this Vit D is appropriate to refill or is blood work needed first.   Patient is requesting a refill of the following medications: Requested Prescriptions   Pending Prescriptions Disp Refills   Vitamin D, Ergocalciferol, (DRISDOL) 1.25 MG (50000 UNIT) CAPS capsule [Pharmacy Med Name: VITAMIN D2 1.25MG (50,000 UNIT)] 15 capsule 0    Sig: Take 1 capsule (50,000 Units total) by mouth every 7 (seven) days.    Date of patient request: 06/24/20 Last office visit: 04/18/20 Date of last refill: 04/18/20 Last refill amount: 15 Follow up time period per chart: today

## 2020-06-24 NOTE — Patient Instructions (Addendum)
It is very important to meet with a therapist for depression and anxiety. Please let me know if other numbers needed. Call your psychiatrist if needed sooner than your scheduled appointment in 1 month.   Increased to 16 units lantus tonight then 2 additional units every 3 days until readings below 200 (then stay at that dose).   miralax up to once per day ok if needed for constipation for now.  Citrucel over the counter daily can also help prevent constipation.    Constipation, Adult Constipation is when a person has fewer bowel movements in a week than normal, has difficulty having a bowel movement, or has stools that are dry, hard, or larger than normal. Constipation may be caused by an underlying condition. It may become worse with age if a person takes certain medicines and does not take in enough fluids. Follow these instructions at home: Eating and drinking   Eat foods that have a lot of fiber, such as fresh fruits and vegetables, whole grains, and beans.  Limit foods that are high in fat, low in fiber, or overly processed, such as french fries, hamburgers, cookies, candies, and soda.  Drink enough fluid to keep your urine clear or pale yellow. General instructions  Exercise regularly or as told by your health care provider.  Go to the restroom when you have the urge to go. Do not hold it in.  Take over-the-counter and prescription medicines only as told by your health care provider. These include any fiber supplements.  Practice pelvic floor retraining exercises, such as deep breathing while relaxing the lower abdomen and pelvic floor relaxation during bowel movements.  Watch your condition for any changes.  Keep all follow-up visits as told by your health care provider. This is important. Contact a health care provider if:  You have pain that gets worse.  You have a fever.  You do not have a bowel movement after 4 days.  You vomit.  You are not hungry.  You lose  weight.  You are bleeding from the anus.  You have thin, pencil-like stools. Get help right away if:  You have a fever and your symptoms suddenly get worse.  You leak stool or have blood in your stool.  Your abdomen is bloated.  You have severe pain in your abdomen.  You feel dizzy or you faint. This information is not intended to replace advice given to you by your health care provider. Make sure you discuss any questions you have with your health care provider. Document Revised: 10/29/2017 Document Reviewed: 05/06/2016 Elsevier Patient Education  [employer].    If you have lab work done today you will be contacted with your lab results within the next 2 weeks.  If you have not heard from [place] then please contact us. The fastest way to get your results is to register for My Chart.   IF you received an x-ray today, you will receive an invoice from [facility]. Please contact [facility] at [phone] with questions or concerns regarding your invoice.   IF you received labwork today, you will receive an invoice from [place]. Please contact [facility] at [phone] with questions or concerns regarding your invoice.   Our billing staff will not be able to assist you with questions regarding bills from these companies.  You will be contacted with the lab results as soon as they are available. The fastest way to get your results is to activate your My Chart account. Instructions are located  on the last page of this paperwork. If you have not heard from [place] regarding the results in 2 weeks, please contact this office.

## 2020-07-08 ENCOUNTER — Other Ambulatory Visit: Payer: Self-pay

## 2020-07-08 ENCOUNTER — Encounter: Payer: Self-pay | Admitting: Family Medicine

## 2020-07-08 ENCOUNTER — Ambulatory Visit (INDEPENDENT_AMBULATORY_CARE_PROVIDER_SITE_OTHER): Payer: Medicare Other | Admitting: Family Medicine

## 2020-07-08 VITALS — BP 140/88 | HR 87 | Temp 97.9°F | Ht 60.0 in | Wt 165.0 lb

## 2020-07-08 DIAGNOSIS — R319 Hematuria, unspecified: Secondary | ICD-10-CM

## 2020-07-08 DIAGNOSIS — E1165 Type 2 diabetes mellitus with hyperglycemia: Secondary | ICD-10-CM | POA: Diagnosis not present

## 2020-07-08 DIAGNOSIS — N390 Urinary tract infection, site not specified: Secondary | ICD-10-CM

## 2020-07-08 DIAGNOSIS — M549 Dorsalgia, unspecified: Secondary | ICD-10-CM | POA: Diagnosis not present

## 2020-07-08 LAB — POCT URINALYSIS DIP (MANUAL ENTRY)
Bilirubin, UA: NEGATIVE
Glucose, UA: NEGATIVE mg/dL
Ketones, POC UA: NEGATIVE mg/dL
Nitrite, UA: NEGATIVE
Protein Ur, POC: 100 mg/dL — AB
Spec Grav, UA: 1.02 (ref 1.010–1.025)
Urobilinogen, UA: 1 E.U./dL
pH, UA: 7 (ref 5.0–8.0)

## 2020-07-08 LAB — POCT GLYCOSYLATED HEMOGLOBIN (HGB A1C): Hemoglobin A1C: 11.5 % — AB (ref 4.0–5.6)

## 2020-07-08 LAB — POC MICROSCOPIC URINALYSIS (UMFC): Mucus: ABSENT

## 2020-07-08 LAB — GLUCOSE, POCT (MANUAL RESULT ENTRY): POC Glucose: 191 mg/dl — AB (ref 70–99)

## 2020-07-08 MED ORDER — NITROFURANTOIN MONOHYD MACRO 100 MG PO CAPS: 100.0000 mg | ORAL_CAPSULE | Freq: Two times a day (BID) | ORAL | 0 refills | Status: DC

## 2020-07-08 NOTE — Progress Notes (Signed)
Subjective:  Patient ID: Felicia Hanson, female    DOB: [birth year]  Age: 64 y.o. MRN: [id number]  CC:  Chief Complaint  Patient presents with  . Diabetes    Pt reports no issues with this conditon since last OV. pt states her BS is slowly coming down and she is now taking 20 Units of her solostar insulin daily. PT dose report a head ache off and on that she thinks may be related some how.. pt's BS readings are now in thw 180's below 200.  . Back Pain    Started 2 days ago. pt states its located in her mid back around the [place] area. pt repots her urine has been dark and strong smelling. pt reports she notices the pain even when walking. Pt reports no pain while urinating and is drinking plently of fluids, not always water stated the pt, but she states she is getting better about that.    HPI Felicia Hanson presents for  Diabetes: Complicated by hyperglycemia.  Last discussed July 26, still elevated readings in the 200s 300s.  Treated with Lantus, trulicity 1.'5mg'$  q week, Januvia '100mg'$  qd. increased dosage of Lantus last visit July 26.  She is on losartan for ARB, cough with lisinopril previously. Home readings in the 200's. Then 185 few days ago-  Lowest reading  Depression has improved some - not yet met with therapist.  Back Pain: 2 days ago.  Strong smelling urine past week. Min urgency, frequency. No hematuria.  No fever, no n/v.  Low back sore. No radiation to legs.  Tx: none.  Some headache at times. Takes alleve a few times.   Lab Results  Component Value Date   HGBA1C 11.5 (A) 07/08/2020   HGBA1C 11.8 (H) 04/03/2020   HGBA1C 8.6 (A) 10/18/2019   Lab Results  Component Value Date   LDLCALC 98 04/03/2020   CREATININE 0.76 06/10/2020      History Patient Active Problem List   Diagnosis Date Noted  . Type 2 diabetes mellitus without complication, without long-term current use of insulin ([place]) 10/26/2017  . Generalized anxiety disorder 01/10/2014  . Vitamin D  deficiency 01/10/2014  . Essential hypertension, benign 01/10/2014  . Pure hypercholesterolemia 01/10/2014  . Hypothyroidism 01/10/2014   Past Medical History:  Diagnosis Date  . Anxiety   . Coronary artery calcification   . Depression   . Diabetes mellitus without complication ([place])   . Hyperlipidemia   . Hypertension   . Thyroid disease    Past Surgical History:  Procedure Laterality Date  . BREAST EXCISIONAL BIOPSY Left   . CHOLECYSTECTOMY    . COLON SURGERY     diverticulitis with perforation; s/p colon resection.  . Colonoscopy    . TUBAL LIGATION     Allergies  Allergen Reactions  . Metformin And Related Anaphylaxis    Could not swallow  . Ace Inhibitors Cough    Cough with lisinopril.    Prior to Admission medications   Medication Sig Start Date End Date Taking? Authorizing Provider  ACCU-CHEK AVIVA PLUS test strip daily. for testing 03/11/18  Yes [provider]  ALPRAZolam Duanne Moron) 1 MG tablet Take 0.5 mg by mouth 3 (three) times daily as needed for anxiety or sleep.  08/21/13  Yes [provider]  aspirin 81 MG tablet Take 81 mg by mouth daily.   Yes [provider]  betamethasone dipropionate 0.05 % lotion Apply 1 application topically as needed.  04/10/20  Yes [provider]  cetirizine (ZYRTEC) 10 MG tablet Take 1 tablet (10 mg total) by mouth daily. 10/18/19  Yes Forrest Moron, MD  Continuous Blood Gluc Receiver (DEXCOM G6 RECEIVER) DEVI 1 Device by Does not apply route every morning. Device and supplies 04/18/20  Yes Wendie Agreste, MD  Dulaglutide (TRULICITY) 1.5 VV/6.1YW SOPN Inject 1.5 mg into the skin once a week. 04/18/20  Yes Wendie Agreste, MD  DULoxetine (CYMBALTA) 60 MG capsule Take 2 capsules (120 mg total) by mouth daily. 06/14/20  Yes Wendie Agreste, MD  insulin glargine (LANTUS SOLOSTAR) 100 UNIT/ML Solostar Pen Inject 10 Units into the skin daily. Patient taking differently: Inject 20 Units into the skin  daily.  05/23/20  Yes Wendie Agreste, MD  Insulin Pen Needle 31G X 5 MM MISC Inject 1 each into the skin daily. 05/24/20  Yes Wendie Agreste, MD  levothyroxine (SYNTHROID) 88 MCG tablet TAKE 1 TABLET (88 MCG TOTAL) BY MOUTH DAILY BEFORE BREAKFAST. 02/07/20  Yes Forrest Moron, MD  losartan (COZAAR) 50 MG tablet Take 1 tablet (50 mg total) by mouth every evening. 06/21/20 09/19/20 Yes Tolia, Sunit, DO  metoprolol tartrate (LOPRESSOR) 50 MG tablet Take 1 tablet (50 mg total) by mouth 2 (two) times daily. 06/10/20  Yes Wendie Agreste, MD  nitroGLYCERIN (NITROSTAT) 0.4 MG SL tablet PLACE 1 TABLET UNDER THE TONGUE EVERY 5 MINUTES AS NEEDED FOR CHEST PAIN. IF YOU REQUIRE MORE THAN TWO TABLETS FIVE MINUTES APART GO TO THE NEAREST ER VIA EMS. 06/10/20  Yes Tolia, Sunit, DO  ondansetron (ZOFRAN ODT) 4 MG disintegrating tablet Take 1 tablet (4 mg total) by mouth every 8 (eight) hours as needed for nausea or vomiting. 07/14/19  Yes Delia Chimes A, MD  pravastatin (PRAVACHOL) 80 MG tablet Take 1 tablet (80 mg total) by mouth every evening. 05/20/20 08/18/20 Yes Tolia, Sunit, DO  sitaGLIPtin (JANUVIA) 100 MG tablet Take 1 tablet (100 mg total) by mouth daily. 06/14/20  Yes Wendie Agreste, MD  triamcinolone cream (KENALOG) 0.5 % Apply 1 application topically 3 (three) times daily. Apply to back and forearms, avoid the face 10/18/19  Yes Stallings, Zoe A, MD  Vitamin D, Ergocalciferol, (DRISDOL) 1.25 MG (50000 UNIT) CAPS capsule TAKE 1 CAPSULE (50,000 UNITS TOTAL) BY MOUTH EVERY 7 (SEVEN) DAYS. 06/24/20  Yes Wendie Agreste, MD   Social History   Socioeconomic History  . Marital status: Married    Spouse name: Not on file  . Number of children: 2  . Years of education: Not on file  . Highest education level: Not on file  Occupational History  . Not on file  Tobacco Use  . Smoking status: Current Every Day Smoker    Packs/day: 0.75    Years: 38.00    Pack years: 28.50    Types: Cigarettes  .  Smokeless tobacco: Never Used  Vaping Use  . Vaping Use: Former  Substance and Sexual Activity  . Alcohol use: No    Alcohol/week: 0.0 standard drinks  . Drug use: No  . Sexual activity: Yes  Other Topics Concern  . 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 Difficulty of Paying Living Expenses:   Food Insecurity:   . Worried About Charity fundraiser in the Last Year:   . Arboriculturist in the Last Year:   Transportation Needs:   . Film/video editor (Medical):   Marland Kitchen Lack of Transportation (Non-Medical):   Physical Activity:   . Days of Exercise per Week:   . Minutes of Exercise per Session:   Stress:   . Feeling of Stress :   Social Connections:   . Frequency of Communication with Friends and Family:   . Frequency of Social Gatherings with Friends and Family:   . Attends Religious Services:   . Active Member of Clubs or Organizations:   . Attends Archivist Meetings:   Marland Kitchen Marital Status:   Intimate Partner Violence:   . Fear of Current or Ex-Partner:   . Emotionally Abused:   Marland Kitchen Physically Abused:   . Sexually Abused:     Review of Systems  Per HPI.  Objective:   Vitals:   07/08/20 1524 07/08/20 1528  BP: (!) 150/86 140/88  Pulse: 87   Temp: 97.9 F (36.6 C)   TempSrc: Temporal   SpO2: 95%   Weight: 165 lb (74.8 kg)   Height: 5' (1.524 m)      Physical Exam Constitutional:      General: She is not in acute distress.    Appearance: She is well-developed.  HENT:     Head: Normocephalic and atraumatic.  Cardiovascular:     Rate and Rhythm: Normal rate.  Pulmonary:     Effort: Pulmonary effort is normal.  Abdominal:     General: Abdomen is flat. There is no distension.     Tenderness:  There is no abdominal tenderness. There is no right CVA tenderness, left CVA tenderness or guarding.  Musculoskeletal:     Comments: Lumbar spine with minimal decreased range of motion with flexion, rotation, some reproduction of lower back symptoms with range of motion.  Negative seated straight leg raise.  No CVA tenderness.  Ambulating without assistance.  Neurological:     Mental Status: She is alert and oriented to person, place, and time.       Results for orders placed or performed in visit on 07/08/20  POCT urinalysis dipstick  Result Value Ref Range   Color, UA yellow yellow   Clarity, UA cloudy (A) clear   Glucose, UA negative negative mg/dL   Bilirubin, UA negative negative   Ketones, POC UA negative negative mg/dL   Spec Grav, UA 1.020 1.010 - 1.025   Blood, UA large (A) negative   pH, UA 7.0 5.0 - 8.0   Protein Ur, POC =100 (A) negative mg/dL   Urobilinogen, UA 1.0 0.2 or 1.0 E.U./dL   Nitrite, UA Negative Negative   Leukocytes, UA Small (1+) (A) Negative  POCT Microscopic Urinalysis ([facility])  Result Value Ref Range   WBC,UR,HPF,POC Many (A) None WBC/hpf   RBC,UR,HPF,POC Few (A) None RBC/hpf   Bacteria Moderate (A) None, Too numerous to count   Mucus Absent Absent   Epithelial Cells, UR Per Microscopy Few (A) None, Too numerous to count cells/hpf  POCT glucose (manual entry)  Result Value Ref Range   POC Glucose 191 (A) 70 - 99 mg/dl  POCT glycosylated hemoglobin (Hb A1C)  Result Value Ref Range   Hemoglobin A1C 11.5 (A) 4.0 - 5.6 %   HbA1c POC (<>  result, manual entry)     HbA1c, POC (prediabetic range)     HbA1c, POC (controlled diabetic range)     Assessment & Plan:  HATTIE PINE is a 64 y.o. female . Uncontrolled type 2 diabetes mellitus with hyperglycemia ([place]) - Plan: POCT glucose (manual entry), POCT glycosylated hemoglobin (Hb A1C)  - improving. Increase lantus by 2 units, continue other med regimen, home readings over next week.   Back pain,  unspecified back location, unspecified back pain laterality, unspecified chronicity - Plan: POCT urinalysis dipstick, POCT Microscopic Urinalysis ([facility])  - likely mechanical/muscular. Symptomatic care, with handout given. rtc precautions.   Urinary tract infection with hematuria, site unspecified - Plan: Urine Culture, nitrofurantoin, macrocrystal-monohydrate, (MACROBID) 100 MG capsule  - macrobid '100mg'$  bid.  check urine cx, rtc precautions.   Meds ordered this encounter  Medications  . nitrofurantoin, macrocrystal-monohydrate, (MACROBID) 100 MG capsule    Sig: Take 1 capsule (100 mg total) by mouth 2 (two) times daily.    Dispense:  14 capsule    Refill:  0   Patient Instructions    Increase lantus to 22 units per day. Continue other meds same dose for now.  In 1 week, bring a copy of your home readings, and we can decide on further changes.  In office exam with me in 1 month, can monitor your home readings on a weekly or biweekly basis until then.  Start nitrofurantoin twice per day for 1 week for urinary tract infection.  Make sure to drink plenty fluids, see information below.  Back pain does not appear to be kidneys, more likely muscles or other cause of low back pain.  See information below.  Tylenol, heat or ice and gentle range of motion/stretches can help.  Return if any worsening.  Return to the clinic or go to the nearest emergency room if any of your symptoms worsen or new symptoms occur.   Acute Back Pain, Adult Acute back pain is sudden and usually short-lived. It is often caused by an injury to the muscles and tissues in the back. The injury may result from:  A muscle or ligament getting overstretched or torn (strained). Ligaments are tissues that connect bones to each other. Lifting something improperly can cause a back strain.  Wear and tear (degeneration) of the spinal disks. Spinal disks are circular tissue that provides cushioning between the bones of the spine  (vertebrae).  Twisting motions, such as while playing sports or doing yard work.  A hit to the back.  Arthritis. You may have a physical exam, lab tests, and imaging tests to find the cause of your pain. Acute back pain usually goes away with rest and home care. Follow these instructions at home: Managing pain, stiffness, and swelling  Take over-the-counter and prescription medicines only as told by your health care provider.  Your health care provider may recommend applying ice during the first 24-48 hours after your pain starts. To do this: ? Put ice in a plastic bag. ? Place a towel between your skin and the bag. ? Leave the ice on for 20 minutes, 2-3 times a day.  If directed, apply heat to the affected area as often as told by your health care provider. Use the heat source that your health care provider recommends, such as a moist heat pack or a heating pad. ? Place a towel between your skin and the heat source. ? Leave the heat on for 20-30 minutes. ? Remove the heat if your  skin turns bright red. This is especially important if you are unable to feel pain, heat, or cold. You have a greater risk of getting burned. Activity   Do not stay in bed. Staying in bed for more than 1-2 days can delay your recovery.  Sit up and stand up straight. Avoid leaning forward when you sit, or hunching over when you stand. ? If you work at a desk, sit close to it so you do not need to lean over. Keep your chin tucked in. Keep your neck drawn back, and keep your elbows bent at a right angle. Your arms should look like the letter "L." ? Sit high and close to the steering wheel when you drive. Add lower back (lumbar) support to your car seat, if needed.  Take short walks on even surfaces as soon as you are able. Try to increase the length of time you walk each day.  Do not sit, drive, or stand in one place for more than 30 minutes at a time. Sitting or standing for long periods of time can put  stress on your back.  Do not drive or use heavy machinery while taking prescription pain medicine.  Use proper lifting techniques. When you bend and lift, use positions that put less stress on your back: ? [place] your knees. ? Keep the load close to your body. ? Avoid twisting.  Exercise regularly as told by your health care provider. Exercising helps your back heal faster and helps prevent back injuries by keeping muscles strong and flexible.  Work with a physical therapist to make a safe exercise program, as recommended by your health care provider. Do any exercises as told by your physical therapist. Lifestyle  Maintain a healthy weight. Extra weight puts stress on your back and makes it difficult to have good posture.  Avoid activities or situations that make you feel anxious or stressed. Stress and anxiety increase muscle tension and can make back pain worse. Learn ways to manage anxiety and stress, such as through exercise. General instructions  Sleep on a firm mattress in a comfortable position. Try lying on your side with your knees slightly bent. If you lie on your back, put a pillow under your knees.  Follow your treatment plan as told by your health care provider. This may include: ? Cognitive or behavioral therapy. ? Acupuncture or massage therapy. ? Meditation or yoga. Contact a health care provider if:  You have pain that is not relieved with rest or medicine.  You have increasing pain going down into your legs or buttocks.  Your pain does not improve after 2 weeks.  You have pain at night.  You lose weight without trying.  You have a fever or chills. Get help right away if:  You develop new bowel or bladder control problems.  You have unusual weakness or numbness in your arms or legs.  You develop nausea or vomiting.  You develop abdominal pain.  You feel faint. Summary  Acute back pain is sudden and usually short-lived.  Use proper lifting  techniques. When you bend and lift, use positions that put less stress on your back.  Take over-the-counter and prescription medicines and apply heat or ice as directed by your health care provider. This information is not intended to replace advice given to you by your health care provider. Make sure you discuss any questions you have with your health care provider. Document Revised: 03/07/2019 Document Reviewed: 06/30/2017 Elsevier Patient Education  2020 Elsevier  Inc.  Urinary Tract Infection, Adult  A urinary tract infection (UTI) is an infection of any part of the urinary tract. The urinary tract includes the kidneys, ureters, bladder, and urethra. These organs make, store, and get rid of urine in the body. Your health care provider may use other names to describe the infection. An upper UTI affects the ureters and kidneys (pyelonephritis). A lower UTI affects the bladder (cystitis) and urethra (urethritis). What are the causes? Most urinary tract infections are caused by bacteria in your genital area, around the entrance to your urinary tract (urethra). These bacteria grow and cause inflammation of your urinary tract. What increases the risk? You are more likely to develop this condition if:  You have a urinary catheter that stays in place (indwelling).  You are not able to control when you urinate or have a bowel movement (you have incontinence).  You are female and you: ? Use a spermicide or diaphragm for birth control. ? Have low estrogen levels. ? Are pregnant.  You have certain genes that increase your risk (genetics).  You are sexually active.  You take antibiotic medicines.  You have a condition that causes your flow of urine to slow down, such as: ? An enlarged prostate, if you are female. ? Blockage in your urethra (stricture). ? A kidney stone. ? A nerve condition that affects your bladder control (neurogenic bladder). ? Not getting enough to drink, or not urinating  often.  You have certain medical conditions, such as: ? Diabetes. ? A weak disease-fighting system (immunesystem). ? Sickle cell disease. ? Gout. ? Spinal cord injury. What are the signs or symptoms? Symptoms of this condition include:  Needing to urinate right away (urgently).  Frequent urination or passing small amounts of urine frequently.  Pain or burning with urination.  Blood in the urine.  Urine that smells bad or unusual.  Trouble urinating.  Cloudy urine.  Vaginal discharge, if you are female.  Pain in the abdomen or the lower back. You may also have:  Vomiting or a decreased appetite.  Confusion.  Irritability or tiredness.  A fever.  Diarrhea. The first symptom in older adults may be confusion. In some cases, they may not have any symptoms until the infection has worsened. How is this diagnosed? This condition is diagnosed based on your medical history and a physical exam. You may also have other tests, including:  Urine tests.  Blood tests.  Tests for sexually transmitted infections (STIs). If you have had more than one UTI, a cystoscopy or imaging studies may be done to determine the cause of the infections. How is this treated? Treatment for this condition includes:  Antibiotic medicine.  Over-the-counter medicines to treat discomfort.  Drinking enough water to stay hydrated. If you have frequent infections or have other conditions such as a kidney stone, you may need to see a health care provider who specializes in the urinary tract (urologist). In rare cases, urinary tract infections can cause sepsis. Sepsis is a life-threatening condition that occurs when the body responds to an infection. Sepsis is treated in the hospital with IV antibiotics, fluids, and other medicines. Follow these instructions at home:  Medicines  Take over-the-counter and prescription medicines only as told by your health care provider.  If you were prescribed an  antibiotic medicine, take it as told by your health care provider. Do not stop using the antibiotic even if you start to feel better. General instructions  Make sure you: ? Empty your  bladder often and completely. Do not hold urine for long periods of time. ? Empty your bladder after sex. ? Wipe from front to back after a bowel movement if you are female. Use each tissue one time when you wipe.  Drink enough fluid to keep your urine pale yellow.  Keep all follow-up visits as told by your health care provider. This is important. Contact a health care provider if:  Your symptoms do not get better after 1-2 days.  Your symptoms go away and then return. Get help right away if you have:  Severe pain in your back or your lower abdomen.  A fever.  Nausea or vomiting. Summary  A urinary tract infection (UTI) is an infection of any part of the urinary tract, which includes the kidneys, ureters, bladder, and urethra.  Most urinary tract infections are caused by bacteria in your genital area, around the entrance to your urinary tract (urethra).  Treatment for this condition often includes antibiotic medicines.  If you were prescribed an antibiotic medicine, take it as told by your health care provider. Do not stop using the antibiotic even if you start to feel better.  Keep all follow-up visits as told by your health care provider. This is important. This information is not intended to replace advice given to you by your health care provider. Make sure you discuss any questions you have with your health care provider. Document Revised: 11/03/2018 Document Reviewed: 05/26/2018 Elsevier Patient Education  [employer].   If you have lab work done today you will be contacted with your lab results within the next 2 weeks.  If you have not heard from [place] then please contact us. The fastest way to get your results is to register for My Chart.   IF you received an x-ray today, you will  receive an invoice from [facility]. Please contact [facility] at [phone] with questions or concerns regarding your invoice.   IF you received labwork today, you will receive an invoice from [place]. Please contact [facility] at [phone] with questions or concerns regarding your invoice.   Our billing staff will not be able to assist you with questions regarding bills from these companies.  You will be contacted with the lab results as soon as they are available. The fastest way to get your results is to activate your My Chart account. Instructions are located on the last page of this paperwork. If you have not heard from [place] regarding the results in 2 weeks, please contact this office.         Signed, Merri Ray, MD [facility]

## 2020-07-08 NOTE — Patient Instructions (Addendum)
Increase lantus to 22 units per day. Continue other meds same dose for now.  In 1 week, bring a copy of your home readings, and we can decide on further changes.  In office exam with me in 1 month, can monitor your home readings on a weekly or biweekly basis until then.  Start nitrofurantoin twice per day for 1 week for urinary tract infection.  Make sure to drink plenty fluids, see information below.  Back pain does not appear to be kidneys, more likely muscles or other cause of low back pain.  See information below.  Tylenol, heat or ice and gentle range of motion/stretches can help.  Return if any worsening.  Return to the clinic or go to the nearest emergency room if any of your symptoms worsen or new symptoms occur.   Acute Back Pain, Adult Acute back pain is sudden and usually short-lived. It is often caused by an injury to the muscles and tissues in the back. The injury may result from:  A muscle or ligament getting overstretched or torn (strained). Ligaments are tissues that connect bones to each other. Lifting something improperly can cause a back strain.  Wear and tear (degeneration) of the spinal disks. Spinal disks are circular tissue that provides cushioning between the bones of the spine (vertebrae).  Twisting motions, such as while playing sports or doing yard work.  A hit to the back.  Arthritis. You may have a physical exam, lab tests, and imaging tests to find the cause of your pain. Acute back pain usually goes away with rest and home care. Follow these instructions at home: Managing pain, stiffness, and swelling  Take over-the-counter and prescription medicines only as told by your health care provider.  Your health care provider may recommend applying ice during the first 24-48 hours after your pain starts. To do this: ? Put ice in a plastic bag. ? Place a towel between your skin and the bag. ? Leave the ice on for 20 minutes, 2-3 times a day.  If directed,  apply heat to the affected area as often as told by your health care provider. Use the heat source that your health care provider recommends, such as a moist heat pack or a heating pad. ? Place a towel between your skin and the heat source. ? Leave the heat on for 20-30 minutes. ? Remove the heat if your skin turns bright red. This is especially important if you are unable to feel pain, heat, or cold. You have a greater risk of getting burned. Activity   Do not stay in bed. Staying in bed for more than 1-2 days can delay your recovery.  Sit up and stand up straight. Avoid leaning forward when you sit, or hunching over when you stand. ? If you work at a desk, sit close to it so you do not need to lean over. Keep your chin tucked in. Keep your neck drawn back, and keep your elbows bent at a right angle. Your arms should look like the letter "L." ? Sit high and close to the steering wheel when you drive. Add lower back (lumbar) support to your car seat, if needed.  Take short walks on even surfaces as soon as you are able. Try to increase the length of time you walk each day.  Do not sit, drive, or stand in one place for more than 30 minutes at a time. Sitting or standing for long periods of time can put stress on your  back.  Do not drive or use heavy machinery while taking prescription pain medicine.  Use proper lifting techniques. When you bend and lift, use positions that put less stress on your back: ? [place] your knees. ? Keep the load close to your body. ? Avoid twisting.  Exercise regularly as told by your health care provider. Exercising helps your back heal faster and helps prevent back injuries by keeping muscles strong and flexible.  Work with a physical therapist to make a safe exercise program, as recommended by your health care provider. Do any exercises as told by your physical therapist. Lifestyle  Maintain a healthy weight. Extra weight puts stress on your back and makes it  difficult to have good posture.  Avoid activities or situations that make you feel anxious or stressed. Stress and anxiety increase muscle tension and can make back pain worse. Learn ways to manage anxiety and stress, such as through exercise. General instructions  Sleep on a firm mattress in a comfortable position. Try lying on your side with your knees slightly bent. If you lie on your back, put a pillow under your knees.  Follow your treatment plan as told by your health care provider. This may include: ? Cognitive or behavioral therapy. ? Acupuncture or massage therapy. ? Meditation or yoga. Contact a health care provider if:  You have pain that is not relieved with rest or medicine.  You have increasing pain going down into your legs or buttocks.  Your pain does not improve after 2 weeks.  You have pain at night.  You lose weight without trying.  You have a fever or chills. Get help right away if:  You develop new bowel or bladder control problems.  You have unusual weakness or numbness in your arms or legs.  You develop nausea or vomiting.  You develop abdominal pain.  You feel faint. Summary  Acute back pain is sudden and usually short-lived.  Use proper lifting techniques. When you bend and lift, use positions that put less stress on your back.  Take over-the-counter and prescription medicines and apply heat or ice as directed by your health care provider. This information is not intended to replace advice given to you by your health care provider. Make sure you discuss any questions you have with your health care provider. Document Revised: 03/07/2019 Document Reviewed: 06/30/2017 Elsevier Patient Education  2020 Elsevier Inc.  Urinary Tract Infection, Adult  A urinary tract infection (UTI) is an infection of any part of the urinary tract. The urinary tract includes the kidneys, ureters, bladder, and urethra. These organs make, store, and get rid of urine in  the body. Your health care provider may use other names to describe the infection. An upper UTI affects the ureters and kidneys (pyelonephritis). A lower UTI affects the bladder (cystitis) and urethra (urethritis). What are the causes? Most urinary tract infections are caused by bacteria in your genital area, around the entrance to your urinary tract (urethra). These bacteria grow and cause inflammation of your urinary tract. What increases the risk? You are more likely to develop this condition if:  You have a urinary catheter that stays in place (indwelling).  You are not able to control when you urinate or have a bowel movement (you have incontinence).  You are female and you: ? Use a spermicide or diaphragm for birth control. ? Have low estrogen levels. ? Are pregnant.  You have certain genes that increase your risk (genetics).  You are sexually active.  You take antibiotic medicines.  You have a condition that causes your flow of urine to slow down, such as: ? An enlarged prostate, if you are female. ? Blockage in your urethra (stricture). ? A kidney stone. ? A nerve condition that affects your bladder control (neurogenic bladder). ? Not getting enough to drink, or not urinating often.  You have certain medical conditions, such as: ? Diabetes. ? A weak disease-fighting system (immunesystem). ? Sickle cell disease. ? Gout. ? Spinal cord injury. What are the signs or symptoms? Symptoms of this condition include:  Needing to urinate right away (urgently).  Frequent urination or passing small amounts of urine frequently.  Pain or burning with urination.  Blood in the urine.  Urine that smells bad or unusual.  Trouble urinating.  Cloudy urine.  Vaginal discharge, if you are female.  Pain in the abdomen or the lower back. You may also have:  Vomiting or a decreased appetite.  Confusion.  Irritability or tiredness.  A fever.  Diarrhea. The first symptom  in older adults may be confusion. In some cases, they may not have any symptoms until the infection has worsened. How is this diagnosed? This condition is diagnosed based on your medical history and a physical exam. You may also have other tests, including:  Urine tests.  Blood tests.  Tests for sexually transmitted infections (STIs). If you have had more than one UTI, a cystoscopy or imaging studies may be done to determine the cause of the infections. How is this treated? Treatment for this condition includes:  Antibiotic medicine.  Over-the-counter medicines to treat discomfort.  Drinking enough water to stay hydrated. If you have frequent infections or have other conditions such as a kidney stone, you may need to see a health care provider who specializes in the urinary tract (urologist). In rare cases, urinary tract infections can cause sepsis. Sepsis is a life-threatening condition that occurs when the body responds to an infection. Sepsis is treated in the hospital with IV antibiotics, fluids, and other medicines. Follow these instructions at home:  Medicines  Take over-the-counter and prescription medicines only as told by your health care provider.  If you were prescribed an antibiotic medicine, take it as told by your health care provider. Do not stop using the antibiotic even if you start to feel better. General instructions  Make sure you: ? Empty your bladder often and completely. Do not hold urine for long periods of time. ? Empty your bladder after sex. ? Wipe from front to back after a bowel movement if you are female. Use each tissue one time when you wipe.  Drink enough fluid to keep your urine pale yellow.  Keep all follow-up visits as told by your health care provider. This is important. Contact a health care provider if:  Your symptoms do not get better after 1-2 days.  Your symptoms go away and then return. Get help right away if you have:  Severe  pain in your back or your lower abdomen.  A fever.  Nausea or vomiting. Summary  A urinary tract infection (UTI) is an infection of any part of the urinary tract, which includes the kidneys, ureters, bladder, and urethra.  Most urinary tract infections are caused by bacteria in your genital area, around the entrance to your urinary tract (urethra).  Treatment for this condition often includes antibiotic medicines.  If you were prescribed an antibiotic medicine, take it as told by your health care provider. Do not stop using  the antibiotic even if you start to feel better.  Keep all follow-up visits as told by your health care provider. This is important. This information is not intended to replace advice given to you by your health care provider. Make sure you discuss any questions you have with your health care provider. Document Revised: 11/03/2018 Document Reviewed: 05/26/2018 Elsevier Patient Education  [employer].   If you have lab work done today you will be contacted with your lab results within the next 2 weeks.  If you have not heard from [place] then please contact us. The fastest way to get your results is to register for My Chart.   IF you received an x-ray today, you will receive an invoice from [facility]. Please contact [facility] at [phone] with questions or concerns regarding your invoice.   IF you received labwork today, you will receive an invoice from [facility]. Please contact [facility] at [phone] with questions or concerns regarding your invoice.   Our billing staff will not be able to assist you with questions regarding bills from these companies.  You will be contacted with the lab results as soon as they are available. The fastest way to get your results is to activate your My Chart account. Instructions are located on the last page of this paperwork. If you have not heard from [place] regarding the results in 2 weeks, please contact  this office.

## 2020-07-10 ENCOUNTER — Other Ambulatory Visit: Payer: Self-pay | Admitting: Family Medicine

## 2020-07-12 LAB — URINE CULTURE

## 2020-07-29 ENCOUNTER — Other Ambulatory Visit: Payer: Self-pay | Admitting: Cardiology

## 2020-07-29 DIAGNOSIS — R9439 Abnormal result of other cardiovascular function study: Secondary | ICD-10-CM

## 2020-08-08 ENCOUNTER — Ambulatory Visit: Payer: Medicare Other | Admitting: Family Medicine

## 2020-08-10 ENCOUNTER — Other Ambulatory Visit: Payer: Self-pay | Admitting: Family Medicine

## 2020-08-12 ENCOUNTER — Ambulatory Visit: Payer: Medicare Other | Admitting: Family Medicine

## 2020-08-12 ENCOUNTER — Telehealth: Payer: Self-pay | Admitting: Family Medicine

## 2020-08-12 ENCOUNTER — Other Ambulatory Visit: Payer: Self-pay | Admitting: Cardiology

## 2020-08-12 DIAGNOSIS — E7800 Pure hypercholesterolemia, unspecified: Secondary | ICD-10-CM

## 2020-08-12 NOTE — Telephone Encounter (Signed)
What is the name of the medication? insulin glargine (LANTUS SOLOSTAR) 100 UNIT/ML Solostar Pen [[id number]]   Have you contacted your pharmacy to request a refill? Her appointment 08/12/20 got cancelled because Dr. Neva Seat was out of office today. She has an upcoming appointment with Neva Seat on 09/04/20. She would like a curtesy refill.   Which pharmacy would you like this sent to? Pharmacy  [facility] [phone] [place], [place] - [address]  [address] [place] [address]  Phone:  [phone] Fax:  [phone]       Patient notified that their request is being sent to the clinical staff for review and that they should receive a call once it is complete. If they do not receive a call within 72 hours they can check with their pharmacy or our office.

## 2020-08-13 ENCOUNTER — Other Ambulatory Visit: Payer: Self-pay

## 2020-08-13 DIAGNOSIS — E1165 Type 2 diabetes mellitus with hyperglycemia: Secondary | ICD-10-CM

## 2020-08-13 MED ORDER — LANTUS SOLOSTAR 100 UNIT/ML ~~LOC~~ SOPN: 20.0000 [IU] | PEN_INJECTOR | Freq: Every day | SUBCUTANEOUS | 3 refills | Status: DC

## 2020-08-13 NOTE — Telephone Encounter (Signed)
refilled 

## 2020-08-15 ENCOUNTER — Other Ambulatory Visit: Payer: Self-pay | Admitting: Family Medicine

## 2020-08-22 ENCOUNTER — Other Ambulatory Visit: Payer: Self-pay | Admitting: Family Medicine

## 2020-08-22 MED ORDER — LEVOTHYROXINE SODIUM 88 MCG PO TABS: ORAL_TABLET | ORAL | 1 refills | Status: DC

## 2020-08-22 NOTE — Telephone Encounter (Signed)
Patient requesting levothyroxine (SYNTHROID) 88 MCG tablet, informed please allow 48 to 72 hour turn around time. Patient states she's out and would like request expedited.     [id number] [place], [place] - [address] Phone:  [phone]  Fax:  [phone]

## 2020-09-04 ENCOUNTER — Ambulatory Visit: Payer: Medicare Other | Admitting: Family Medicine

## 2020-09-06 ENCOUNTER — Telehealth (INDEPENDENT_AMBULATORY_CARE_PROVIDER_SITE_OTHER): Payer: Medicare Other | Admitting: Registered Nurse

## 2020-09-06 ENCOUNTER — Encounter: Payer: Self-pay | Admitting: Registered Nurse

## 2020-09-06 ENCOUNTER — Other Ambulatory Visit: Payer: Self-pay

## 2020-09-06 VITALS — Ht 60.0 in

## 2020-09-06 DIAGNOSIS — R5383 Other fatigue: Secondary | ICD-10-CM

## 2020-09-06 DIAGNOSIS — E1165 Type 2 diabetes mellitus with hyperglycemia: Secondary | ICD-10-CM

## 2020-09-06 DIAGNOSIS — J22 Unspecified acute lower respiratory infection: Secondary | ICD-10-CM | POA: Diagnosis not present

## 2020-09-06 LAB — POCT GLYCOSYLATED HEMOGLOBIN (HGB A1C): Hemoglobin A1C: 9.1 % — AB (ref 4.0–5.6)

## 2020-09-06 LAB — POCT CBC
Granulocyte percent: 64.4 %G (ref 37–80)
HCT, POC: 45.7 % — AB (ref 29–41)
Hemoglobin: 15.4 g/dL — AB (ref 11–14.6)
Lymph, poc: 3.8 — AB (ref 0.6–3.4)
MCH, POC: 29.8 pg (ref 27–31.2)
MCHC: 33.6 g/dL (ref 31.8–35.4)
MCV: 88.6 fL (ref 76–111)
MID (cbc): 0.5 (ref 0–0.9)
MPV: 8.4 fL (ref 0–99.8)
POC Granulocyte: 7.8 — AB (ref 2–6.9)
POC LYMPH PERCENT: 31.1 %L (ref 10–50)
POC MID %: 4.5 %M (ref 0–12)
Platelet Count, POC: 242 10*3/uL (ref 142–424)
RBC: 5.16 M/uL (ref 4.04–5.48)
RDW, POC: 13.8 %
WBC: 12.1 10*3/uL — AB (ref 4.6–10.2)

## 2020-09-06 MED ORDER — LANTUS SOLOSTAR 100 UNIT/ML ~~LOC~~ SOPN: 20.0000 [IU] | PEN_INJECTOR | Freq: Every day | SUBCUTANEOUS | 3 refills | Status: DC

## 2020-09-06 MED ORDER — AZITHROMYCIN 250 MG PO TABS: ORAL_TABLET | ORAL | 0 refills | Status: DC

## 2020-09-06 MED ORDER — TRULICITY 1.5 MG/0.5ML ~~LOC~~ SOAJ: 1.5000 mg | SUBCUTANEOUS | 1 refills | Status: DC

## 2020-09-06 MED ORDER — INSULIN PEN NEEDLE 31G X 5 MM MISC: 1.0000 | Freq: Every day | 2 refills | Status: DC

## 2020-09-06 MED ORDER — SITAGLIPTIN PHOSPHATE 100 MG PO TABS: 100.0000 mg | ORAL_TABLET | Freq: Every day | ORAL | 1 refills | Status: DC

## 2020-09-06 NOTE — Progress Notes (Signed)
Telemedicine Encounter- SOAP NOTE Established Patient  This telephone encounter was conducted with the patient's (or proxy's) verbal consent via audio telecommunications: yes  Patient was instructed to have this encounter in a suitably private space; and to only have persons present to whom they give permission to participate. In addition, patient identity was confirmed by use of name plus two identifiers (DOB and address).  I discussed the limitations, risks, security and privacy concerns of performing an evaluation and management service by telephone and the availability of in person appointments. I also discussed with the patient that there may be a patient responsible charge related to this service. The patient expressed understanding and agreed to proceed.  I spent a total of 14 minutes talking with the patient or their proxy.  Chief Complaint  Patient presents with  . Diabetes    Glucose196 -- the day before last  . Fever    low grade within the last week     Subjective   Felicia Hanson is a 64 y.o. established patient. Telephone visit today for t2dm follow up   HPI Feels tx is going well. Reports good compliance. Reports sugars mostly staying below 200, most recent reading is 196. No complaints about progression in any complications.  Does report some heaviness in chest and shortness of breath with mild fatigue. Her son has PNA and has been on z pack - she is concerned that this is what she has as well. Mild cough that has not yet been productive but feels symptoms are progressing over the past 4-5 day. No chest pain, palpitations, dependent edema, claudication  Patient Active Problem List   Diagnosis Date Noted  . Type 2 diabetes mellitus without complication, without long-term current use of insulin (HCC) 10/26/2017  . Generalized anxiety disorder 01/10/2014  . Vitamin D deficiency 01/10/2014  . Essential hypertension, benign 01/10/2014  . Pure hypercholesterolemia  01/10/2014  . Hypothyroidism 01/10/2014    Past Medical History:  Diagnosis Date  . Anxiety   . Coronary artery calcification   . Depression   . Diabetes mellitus without complication (HCC)   . Hyperlipidemia   . Hypertension   . Thyroid disease     Current Outpatient Medications  Medication Sig Dispense Refill  . ACCU-CHEK AVIVA PLUS test strip daily. for testing  2  . ALPRAZolam (XANAX) 1 MG tablet Take 0.5 mg by mouth 3 (three) times daily as needed for anxiety or sleep.     Marland Kitchen aspirin 81 MG tablet Take 81 mg by mouth daily.    . betamethasone dipropionate 0.05 % lotion Apply 1 application topically as needed.     . cetirizine (ZYRTEC) 10 MG tablet Take 1 tablet (10 mg total) by mouth daily. 30 tablet 11  . Continuous Blood Gluc Receiver (DEXCOM G6 RECEIVER) DEVI 1 Device by Does not apply route every morning. Device and supplies 1 each 0  . Dulaglutide (TRULICITY) 1.5 MG/0.5ML SOPN Inject 1.5 mg into the skin once a week. 6 mL 1  . DULoxetine (CYMBALTA) 60 MG capsule Take 2 capsules (120 mg total) by mouth daily. 180 capsule 0  . insulin glargine (LANTUS SOLOSTAR) 100 UNIT/ML Solostar Pen Inject 20 Units into the skin daily. 15 mL 3  . Insulin Pen Needle 31G X 5 MM MISC Inject 1 each into the skin daily. 100 each 2  . levothyroxine (SYNTHROID) 88 MCG tablet TAKE 1 TABLET (88 MCG TOTAL) BY MOUTH DAILY BEFORE BREAKFAST. 90 tablet 1  . losartan (  COZAAR) 50 MG tablet Take 1 tablet (50 mg total) by mouth every evening. 90 tablet 0  . metoprolol tartrate (LOPRESSOR) 50 MG tablet Take 1 tablet (50 mg total) by mouth 2 (two) times daily. 180 tablet 0  . nitrofurantoin, macrocrystal-monohydrate, (MACROBID) 100 MG capsule Take 1 capsule (100 mg total) by mouth 2 (two) times daily. 14 capsule 0  . nitroGLYCERIN (NITROSTAT) 0.4 MG SL tablet PLACE 1 TABLET UNDER THE TONGUE EVERY 5 MINUTES AS NEEDED FOR CHEST PAIN. IF YOU REQUIRE MORE THAN TWO TABLETS FIVE MINUTES APART GO TO THE NEAREST ER VIA  EMS. 25 tablet 1  . ondansetron (ZOFRAN ODT) 4 MG disintegrating tablet Take 1 tablet (4 mg total) by mouth every 8 (eight) hours as needed for nausea or vomiting. 20 tablet 1  . pravastatin (PRAVACHOL) 80 MG tablet TAKE 1 TABLET (80 MG TOTAL) BY MOUTH EVERY EVENING. 90 tablet 0  . sitaGLIPtin (JANUVIA) 100 MG tablet Take 1 tablet (100 mg total) by mouth daily. 90 tablet 1  . triamcinolone cream (KENALOG) 0.5 % Apply 1 application topically 3 (three) times daily. Apply to back and forearms, avoid the face 454 g 1  . Vitamin D, Ergocalciferol, (DRISDOL) 1.25 MG (50000 UNIT) CAPS capsule TAKE 1 CAPSULE (50,000 UNITS TOTAL) BY MOUTH EVERY 7 (SEVEN) DAYS. 15 capsule 0   No current facility-administered medications for this visit.    Allergies  Allergen Reactions  . Metformin And Related Anaphylaxis    Could not swallow  . Ace Inhibitors Cough    Cough with lisinopril.     Social History   Socioeconomic History  . Marital status: Married    Spouse name: Not on file  . Number of children: 2  . Years of education: Not on file  . Highest education level: Not on file  Occupational History  . Not on file  Tobacco Use  . Smoking status: Current Every Day Smoker    Packs/day: 0.75    Years: 38.00    Pack years: 28.50    Types: Cigarettes  . Smokeless tobacco: Never Used  Vaping Use  . Vaping Use: Former  Substance and Sexual Activity  . Alcohol use: No    Alcohol/week: 0.0 standard drinks  . Drug use: No  . Sexual activity: Yes  Other Topics Concern  . 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 Difficulty of Paying Living Expenses: Not on file  Food Insecurity:   . Worried About Programme researcher, broadcasting/film/video in the Last Year: Not on  file  . Ran Out of Food in the Last Year: Not on file  Transportation Needs:   . Lack of Transportation (Medical): Not on file  . Lack of Transportation (Non-Medical): Not on file  Physical Activity:   . Days of Exercise per Week: Not on file  . Minutes of Exercise per Session: Not on file  Stress:   . Feeling of Stress : Not on file  Social Connections:   . Frequency of Communication with Friends and Family: Not on file  . Frequency of Social Gatherings with Friends and Family: Not on  file  . Attends Religious Services: Not on file  . Active Member of Clubs or Organizations: Not on file  . Attends Banker Meetings: Not on file  . Marital Status: Not on file  Intimate Partner Violence:   . Fear of Current or Ex-Partner: Not on file  . Emotionally Abused: Not on file  . Physically Abused: Not on file  . Sexually Abused: Not on file    Review of Systems  Constitutional: Positive for malaise/fatigue.  HENT: Negative.   Eyes: Negative.   Respiratory: Positive for cough and shortness of breath.   Cardiovascular: Negative.   Gastrointestinal: Negative.   Genitourinary: Negative.   Musculoskeletal: Negative.   Skin: Negative.   Neurological: Negative.   Endo/Heme/Allergies: Negative.   Psychiatric/Behavioral: Negative.     Objective   Vitals as reported by the patient: Today's Vitals   09/06/20 1110  Height: 5' (1.524 m)    Saloni was seen today for diabetes and fever.  Diagnoses and all orders for this visit:  Fatigue, unspecified type -     POCT glycosylated hemoglobin (Hb A1C) -     POCT CBC -     Glucose (CBG) -     TSH -     Vitamin D, 25-hydroxy  Uncontrolled type 2 diabetes mellitus with hyperglycemia (HCC) -     POCT glycosylated hemoglobin (Hb A1C) -     Glucose (CBG)   PLAN  Pt was flexed to virtual given low grade temp, but I had the patient in through the back door for lab draws  Seeing elevated wbc. Will treat as PNA with z  pack.  a1c has dropped nicely to 9.1. continue current regimen.   3 month follow up with PCP  Patient encouraged to call clinic with any questions, comments, or concerns.  I discussed the assessment and treatment plan with the patient. The patient was provided an opportunity to ask questions and all were answered. The patient agreed with the plan and demonstrated an understanding of the instructions.   The patient was advised to call back or seek an in-person evaluation if the symptoms worsen or if the condition fails to improve as anticipated.  I provided 14 minutes of non-face-to-face time during this encounter.  Janeece Agee, NP  Primary Care at [facility]

## 2020-09-07 LAB — TSH: TSH: 1.16 u[IU]/mL (ref 0.450–4.500)

## 2020-09-07 LAB — VITAMIN D 25 HYDROXY (VIT D DEFICIENCY, FRACTURES): Vit D, 25-Hydroxy: 44.3 ng/mL (ref 30.0–100.0)

## 2020-09-08 ENCOUNTER — Other Ambulatory Visit: Payer: Self-pay | Admitting: Family Medicine

## 2020-09-08 DIAGNOSIS — I10 Essential (primary) hypertension: Secondary | ICD-10-CM

## 2020-09-09 NOTE — Progress Notes (Signed)
Good morning,  Normal results letter, please!  Thanks,  Rich Bennetta Rudden, NP

## 2020-09-19 ENCOUNTER — Other Ambulatory Visit: Payer: Self-pay | Admitting: Family Medicine

## 2020-09-19 DIAGNOSIS — R7989 Other specified abnormal findings of blood chemistry: Secondary | ICD-10-CM

## 2020-09-19 NOTE — Telephone Encounter (Signed)
Requested medication (s) are due for refill today: no  Requested medication (s) are on the active medication list: yes  Last refill: 06/24/2020  Future visit scheduled: yes  Notes to clinic:  this refill cannot be delegated    Requested Prescriptions  Pending Prescriptions Disp Refills   Vitamin D, Ergocalciferol, (DRISDOL) 1.25 MG (50000 UNIT) CAPS capsule [Pharmacy Med Name: VITAMIN D2 1.25MG (50,000 UNIT)] 15 capsule 0    Sig: TAKE 1 CAPSULE (50,000 UNITS TOTAL) BY MOUTH EVERY 7 (SEVEN) DAYS.      Endocrinology:  Vitamins - Vitamin D Supplementation Failed - 09/19/2020  2:30 PM      Failed - 50,000 IU strengths are not delegated      Failed - Phosphate in normal range and within 360 days    No results found for: PHOS        Passed - Ca in normal range and within 360 days    Calcium  Date Value Ref Range Status  06/10/2020 10.0 8.7 - 10.3 mg/dL Final          Passed - Vitamin D in normal range and within 360 days    Vit D, 25-Hydroxy  Date Value Ref Range Status  09/06/2020 44.3 30.0 - 100.0 ng/mL Final    Comment:    Vitamin D deficiency has been defined by the Institute of Medicine and an Endocrine Society practice guideline as a level of serum 25-OH vitamin D less than 20 ng/mL (1,2). The Endocrine Society went on to further define vitamin D insufficiency as a level between 21 and 29 ng/mL (2). 1. IOM (Institute of Medicine). 2010. Dietary reference    intakes for calcium and D. [place]: The    [employer]. 2. Holick MF, Binkley [place], Bischoff-Ferrari HA, et al.    Evaluation, treatment, and prevention of vitamin D    deficiency: an Endocrine Society clinical practice    guideline. JCEM. 2011 Jul; 96(7):1911-30.           Passed - Valid encounter within last 12 months    Recent Outpatient Visits           1 week ago Fatigue, unspecified type   [facility], Richard, NP   2 months ago Uncontrolled type 2 diabetes mellitus  with hyperglycemia [facility])   [facility], Asencion Partridge, MD   2 months ago Uncontrolled type 2 diabetes mellitus with hyperglycemia [facility])   [facility], Asencion Partridge, MD   3 months ago Uncontrolled type 2 diabetes mellitus with hyperglycemia [facility])   [facility], Asencion Partridge, MD   3 months ago Uncontrolled type 2 diabetes mellitus with hyperglycemia [facility])   [facility] at [facility], Asencion Partridge, MD

## 2020-09-20 ENCOUNTER — Other Ambulatory Visit: Payer: Self-pay | Admitting: Family Medicine

## 2020-09-20 DIAGNOSIS — F418 Other specified anxiety disorders: Secondary | ICD-10-CM

## 2020-09-23 ENCOUNTER — Telehealth: Payer: Self-pay | Admitting: Family Medicine

## 2020-09-23 NOTE — Telephone Encounter (Signed)
Pt would like to know if you would like to see her for a follow up for her chronic medical concerns . Please advise

## 2020-09-23 NOTE — Telephone Encounter (Signed)
Pt would like a cb concerning her most recent lab results. Please advise at [phone].

## 2020-09-24 ENCOUNTER — Telehealth: Payer: Self-pay | Admitting: Family Medicine

## 2020-09-24 ENCOUNTER — Telehealth: Payer: Self-pay

## 2020-09-24 NOTE — Telephone Encounter (Signed)
Ok to follow up in 3 months from visit with Luan Pulling - it appears he reviewed diabetes control at that time. Let me know if there were other concerns or questions on labs.

## 2020-09-24 NOTE — Telephone Encounter (Signed)
Patient has been informed per providers recommendations . She voices no other questions or concerns at this time.

## 2020-09-24 NOTE — Telephone Encounter (Signed)
Pt called and is wanting a nurse to give her a call about recent labs to make sure it is okay for pt to get booster. Please advise.

## 2020-09-24 NOTE — Telephone Encounter (Signed)
Pt advised of lab results discussed vaccines and pt will be getting booster soon

## 2020-10-04 ENCOUNTER — Other Ambulatory Visit: Payer: Self-pay | Admitting: Family Medicine

## 2020-10-04 DIAGNOSIS — R7989 Other specified abnormal findings of blood chemistry: Secondary | ICD-10-CM

## 2020-10-04 NOTE — Telephone Encounter (Signed)
Requested medications are due for refill today yes  Requested medications are on the active medication list yes  Last refill 7/26  Last visit 08/2020  Future visit scheduled no  Notes to clinic Not Delegated

## 2020-10-08 ENCOUNTER — Telehealth: Payer: Self-pay | Admitting: Family Medicine

## 2020-10-08 DIAGNOSIS — E1165 Type 2 diabetes mellitus with hyperglycemia: Secondary | ICD-10-CM

## 2020-10-08 MED ORDER — LANTUS SOLOSTAR 100 UNIT/ML ~~LOC~~ SOPN: 20.0000 [IU] | PEN_INJECTOR | Freq: Every day | SUBCUTANEOUS | 3 refills | Status: DC

## 2020-10-08 NOTE — Telephone Encounter (Signed)
Pt has increase her insulin to 36 unit at night per dr Neva Seat according to patient. Pt was only able to take 31 unit last night. That was all she had left. Pt needs new rx lantus solostar pen , [facility] [address]

## 2020-10-08 NOTE — Telephone Encounter (Signed)
   Notes to clinic:  Patient states that dose was increased to 36 units and need script update and sent   Requested Prescriptions  Pending Prescriptions Disp Refills   insulin glargine (LANTUS SOLOSTAR) 100 UNIT/ML Solostar Pen 15 mL 3    Sig: Inject 20 Units into the skin daily.      There is no refill protocol information for this order

## 2020-10-15 ENCOUNTER — Other Ambulatory Visit: Payer: Self-pay | Admitting: Family Medicine

## 2020-10-18 ENCOUNTER — Telehealth: Payer: Self-pay | Admitting: Family Medicine

## 2020-10-18 ENCOUNTER — Other Ambulatory Visit: Payer: Self-pay | Admitting: Family Medicine

## 2020-10-18 DIAGNOSIS — I10 Essential (primary) hypertension: Secondary | ICD-10-CM

## 2020-10-18 MED ORDER — LOSARTAN POTASSIUM 50 MG PO TABS: 50.0000 mg | ORAL_TABLET | Freq: Every evening | ORAL | 1 refills | Status: DC

## 2020-10-18 NOTE — Telephone Encounter (Signed)
Pt is calling because she needs a refill on  Pt is concerned pharmacy a couple a day and is out again  What is the name of the medication?  losartan (COZAAR) 25 MG tablet [[id number]   Have you contacted your pharmacy to request a refill? Y  Which pharmacy would you like this sent to? Pharmacy  [phone] [place], [place] - [address]  [address] [place] [address]  Phone:  [phone] Fax:  [phone]       Patient notified that their request is being sent to the clinical staff for review and that they should receive a call once it is complete. If they do not receive a call within 72 hours they can check with their pharmacy or our office.

## 2020-10-18 NOTE — Telephone Encounter (Signed)
Patient was recently put on Losartin

## 2020-10-18 NOTE — Telephone Encounter (Signed)
Phone Message routed to provider about rx.

## 2020-10-18 NOTE — Telephone Encounter (Signed)
Re ordered

## 2020-10-18 NOTE — Telephone Encounter (Signed)
I have attempted to call pt and there was no answer. Please advise if you are willing to sent this medication for pt even if the last person who sent this rx was her cards.   RX: Losartan 25 mg.

## 2020-10-19 ENCOUNTER — Other Ambulatory Visit: Payer: Self-pay | Admitting: Cardiology

## 2020-10-19 ENCOUNTER — Telehealth: Payer: Self-pay | Admitting: Family Medicine

## 2020-10-19 DIAGNOSIS — I10 Essential (primary) hypertension: Secondary | ICD-10-CM

## 2020-10-19 DIAGNOSIS — R7989 Other specified abnormal findings of blood chemistry: Secondary | ICD-10-CM

## 2020-10-19 DIAGNOSIS — E7800 Pure hypercholesterolemia, unspecified: Secondary | ICD-10-CM

## 2020-10-19 DIAGNOSIS — F418 Other specified anxiety disorders: Secondary | ICD-10-CM

## 2020-10-21 NOTE — Telephone Encounter (Signed)
Pt called back and stated that medication was refilled. Nothing further.

## 2020-10-21 NOTE — Telephone Encounter (Signed)
Called pt to see why request was needed for refills, med list show meds have been refilled. Pt hung up

## 2020-10-31 ENCOUNTER — Telehealth: Payer: Self-pay | Admitting: Family Medicine

## 2020-10-31 NOTE — Telephone Encounter (Signed)
Pt stated she messed her insulin last night and would like a nurse to give her a call to see if she should be concerned or what she should do. Please advise.

## 2020-10-31 NOTE — Telephone Encounter (Signed)
Called pt to assist with continuing of medication after missed dose of insulin

## 2020-11-04 ENCOUNTER — Other Ambulatory Visit: Payer: Self-pay | Admitting: Family Medicine

## 2020-11-04 DIAGNOSIS — E1165 Type 2 diabetes mellitus with hyperglycemia: Secondary | ICD-10-CM

## 2020-11-04 NOTE — Telephone Encounter (Signed)
Medication: insulin glargine (LANTUS SOLOSTAR) 100 UNIT/ML Solostar Pen [[id number]]   Has the patient contacted their pharmacy? YES  (Agent: If no, request that the patient contact the pharmacy for the refill.) (Agent: If yes, when and what did the pharmacy advise?)  Preferred Pharmacy (with phone number or street name): [phone] [place], [place] - [address]  [place] [address] Phone: [phone] Fax: [phone] Hours: Not open 24 hours    Agent: Please be advised that RX refills may take up to 3 business days. We ask that you follow-up with your pharmacy.

## 2020-11-06 ENCOUNTER — Ambulatory Visit (INDEPENDENT_AMBULATORY_CARE_PROVIDER_SITE_OTHER): Payer: Medicare Other | Admitting: Family Medicine

## 2020-11-06 ENCOUNTER — Other Ambulatory Visit: Payer: Self-pay

## 2020-11-06 ENCOUNTER — Encounter: Payer: Self-pay | Admitting: Family Medicine

## 2020-11-06 VITALS — BP 148/87 | HR 90 | Temp 97.4°F | Ht 60.0 in | Wt 165.0 lb

## 2020-11-06 DIAGNOSIS — N390 Urinary tract infection, site not specified: Secondary | ICD-10-CM

## 2020-11-06 DIAGNOSIS — E1165 Type 2 diabetes mellitus with hyperglycemia: Secondary | ICD-10-CM | POA: Diagnosis not present

## 2020-11-06 DIAGNOSIS — R319 Hematuria, unspecified: Secondary | ICD-10-CM | POA: Diagnosis not present

## 2020-11-06 DIAGNOSIS — R399 Unspecified symptoms and signs involving the genitourinary system: Secondary | ICD-10-CM | POA: Diagnosis not present

## 2020-11-06 LAB — POC MICROSCOPIC URINALYSIS (UMFC): Mucus: ABSENT

## 2020-11-06 LAB — POCT URINALYSIS DIP (MANUAL ENTRY)
Bilirubin, UA: NEGATIVE
Glucose, UA: NEGATIVE mg/dL
Ketones, POC UA: NEGATIVE mg/dL
Nitrite, UA: NEGATIVE
Protein Ur, POC: 100 mg/dL — AB
Spec Grav, UA: 1.025 (ref 1.010–1.025)
Urobilinogen, UA: 0.2 E.U./dL
pH, UA: 5.5 (ref 5.0–8.0)

## 2020-11-06 MED ORDER — SULFAMETHOXAZOLE-TRIMETHOPRIM 800-160 MG PO TABS: 1.0000 | ORAL_TABLET | Freq: Two times a day (BID) | ORAL | 0 refills | Status: DC

## 2020-11-06 MED ORDER — TRULICITY 3 MG/0.5ML ~~LOC~~ SOAJ: 3.0000 mg | SUBCUTANEOUS | 1 refills | Status: DC

## 2020-11-06 NOTE — Patient Instructions (Addendum)
Continue lantus 38 units per day, increase the Trulicity to 3mg  injection per week. That was sent to your pharmacy. Ok to stop Januvia at this time.  Keep a record of your blood sugars, fasting or 2 hours after meal - bring to next visit in 1 month for diabetes.  If readings are increasing - let me know and we can adjust insulin as well.   Start antibiotic sulfamethoxazole 1 pill twice per day for the next 1 week.  See information below.  I expect your symptoms to be improving through the weekend or into early next week.  If any worsening symptoms, be seen here or other care provider.  Return to the clinic or go to the nearest emergency room if any of your symptoms worsen or new symptoms occur.   Urinary Tract Infection, Adult  A urinary tract infection (UTI) is an infection of any part of the urinary tract. The urinary tract includes the kidneys, ureters, bladder, and urethra. These organs make, store, and get rid of urine in the body. Your health care provider may use other names to describe the infection. An upper UTI affects the ureters and kidneys (pyelonephritis). A lower UTI affects the bladder (cystitis) and urethra (urethritis). What are the causes? Most urinary tract infections are caused by bacteria in your genital area, around the entrance to your urinary tract (urethra). These bacteria grow and cause inflammation of your urinary tract. What increases the risk? You are more likely to develop this condition if:  You have a urinary catheter that stays in place (indwelling).  You are not able to control when you urinate or have a bowel movement (you have incontinence).  You are female and you: ? Use a spermicide or diaphragm for birth control. ? Have low estrogen levels. ? Are pregnant.  You have certain genes that increase your risk (genetics).  You are sexually active.  You take antibiotic medicines.  You have a condition that causes your flow of urine to slow down, such  as: ? An enlarged prostate, if you are female. ? Blockage in your urethra (stricture). ? A kidney stone. ? A nerve condition that affects your bladder control (neurogenic bladder). ? Not getting enough to drink, or not urinating often.  You have certain medical conditions, such as: ? Diabetes. ? A weak disease-fighting system (immunesystem). ? Sickle cell disease. ? Gout. ? Spinal cord injury. What are the signs or symptoms? Symptoms of this condition include:  Needing to urinate right away (urgently).  Frequent urination or passing small amounts of urine frequently.  Pain or burning with urination.  Blood in the urine.  Urine that smells bad or unusual.  Trouble urinating.  Cloudy urine.  Vaginal discharge, if you are female.  Pain in the abdomen or the lower back. You may also have:  Vomiting or a decreased appetite.  Confusion.  Irritability or tiredness.  A fever.  Diarrhea. The first symptom in older adults may be confusion. In some cases, they may not have any symptoms until the infection has worsened. How is this diagnosed? This condition is diagnosed based on your medical history and a physical exam. You may also have other tests, including:  Urine tests.  Blood tests.  Tests for sexually transmitted infections (STIs). If you have had more than one UTI, a cystoscopy or imaging studies may be done to determine the cause of the infections. How is this treated? Treatment for this condition includes:  Antibiotic medicine.  Over-the-counter medicines to  treat discomfort.  Drinking enough water to stay hydrated. If you have frequent infections or have other conditions such as a kidney stone, you may need to see a health care provider who specializes in the urinary tract (urologist). In rare cases, urinary tract infections can cause sepsis. Sepsis is a life-threatening condition that occurs when the body responds to an infection. Sepsis is treated in the  hospital with IV antibiotics, fluids, and other medicines. Follow these instructions at home:  Medicines  Take over-the-counter and prescription medicines only as told by your health care provider.  If you were prescribed an antibiotic medicine, take it as told by your health care provider. Do not stop using the antibiotic even if you start to feel better. General instructions  Make sure you: ? Empty your bladder often and completely. Do not hold urine for long periods of time. ? Empty your bladder after sex. ? Wipe from front to back after a bowel movement if you are female. Use each tissue one time when you wipe.  Drink enough fluid to keep your urine pale yellow.  Keep all follow-up visits as told by your health care provider. This is important. Contact a health care provider if:  Your symptoms do not get better after 1-2 days.  Your symptoms go away and then return. Get help right away if you have:  Severe pain in your back or your lower abdomen.  A fever.  Nausea or vomiting. Summary  A urinary tract infection (UTI) is an infection of any part of the urinary tract, which includes the kidneys, ureters, bladder, and urethra.  Most urinary tract infections are caused by bacteria in your genital area, around the entrance to your urinary tract (urethra).  Treatment for this condition often includes antibiotic medicines.  If you were prescribed an antibiotic medicine, take it as told by your health care provider. Do not stop using the antibiotic even if you start to feel better.  Keep all follow-up visits as told by your health care provider. This is important. This information is not intended to replace advice given to you by your health care provider. Make sure you discuss any questions you have with your health care provider. Document Revised: 11/03/2018 Document Reviewed: 05/26/2018 Elsevier Patient Education  [employer].     If you have lab work done today  you will be contacted with your lab results within the next 2 weeks.  If you have not heard from [place] then please contact us. The fastest way to get your results is to register for My Chart.   IF you received an x-ray today, you will receive an invoice from [facility]. Please contact [facility] at [phone] with questions or concerns regarding your invoice.   IF you received labwork today, you will receive an invoice from [facility]. Please contact [facility] at [phone] with questions or concerns regarding your invoice.   Our billing staff will not be able to assist you with questions regarding bills from these companies.  You will be contacted with the lab results as soon as they are available. The fastest way to get your results is to activate your My Chart account. Instructions are located on the last page of this paperwork. If you have not heard from [place] regarding the results in 2 weeks, please contact this office.

## 2020-11-06 NOTE — Progress Notes (Signed)
Subjective:  Patient ID: [name], female    DOB: 04-12-1956  Age: 64 y.o. MRN: [id number]  CC:  Chief Complaint  Patient presents with  . possible UTI    Pt reports urgent urination when needing to go, but when urinating pt has to force the urine out. pt reports a bad smell from her urine as well. Pt reports her temp has been running higher than usual for her. pt also has question abour her medication.    HPI [place] DESIYAH RAPOZO presents for   Diabetes: Complicated by hyperglycemia.  Last office visit in October by my colleague.  Treated for pneumonia at that time.  A1c had decreased from 11.5-9.1. Treated with Lantus, Trulicity, Januvia.  She is on losartan for ARB, had cough with ACE inhibitor Lantus was increased to 22 units in August, has been increasing to 38 unitts for improve control. Same dose past 2 weeks.   Continued on Trulicity 1.5 mg/week, januvia 100 QD  Home readings - lowest 178, highest 209 over past month or so.  No symptomatic lows.   Lab Results  Component Value Date   HGBA1C 9.1 (A) 09/06/2020   HGBA1C 11.5 (A) 07/08/2020   HGBA1C 11.8 (H) 04/03/2020   Lab Results  Component Value Date   LDLCALC 98 04/03/2020   CREATININE 0.76 06/10/2020   Low grade temp, slight HA at night, urinary sx's.  Past 7-10 days, urinary urgency and bad odor of urine.  No new back or abdominal pain,,  No nausea/vomiting.  Highest temp 99.9 last night, usually 97.7.  No cough (resolved off ace-I).  No loss of taste/smell.  Had primary covid vaccine series, no booster yet.  Feels about same past week.  Granddaughter with cold symptoms past few weeks. No contact with Covid 19 known.  Tx: alleve for headache, body ache at times - not new, has had some body aches in past.    History Patient Active Problem List   Diagnosis Date Noted  . Type 2 diabetes mellitus without complication, without long-term current use of insulin (HCC) 10/26/2017  . Generalized anxiety disorder  01/10/2014  . Vitamin D deficiency 01/10/2014  . Essential hypertension, benign 01/10/2014  . Pure hypercholesterolemia 01/10/2014  . Hypothyroidism 01/10/2014   Past Medical History:  Diagnosis Date  . Anxiety   . Coronary artery calcification   . Depression   . Diabetes mellitus without complication (HCC)   . Hyperlipidemia   . Hypertension   . Thyroid disease    Past Surgical History:  Procedure Laterality Date  . BREAST EXCISIONAL BIOPSY Left   . CHOLECYSTECTOMY    . COLON SURGERY     diverticulitis with perforation; s/p colon resection.  . Colonoscopy    . TUBAL LIGATION     Allergies  Allergen Reactions  . Metformin And Related Anaphylaxis    Could not swallow  . Ace Inhibitors Cough    Cough with lisinopril.    Prior to Admission medications   Medication Sig Start Date End Date Taking? Authorizing Provider  ACCU-CHEK AVIVA PLUS test strip daily. for testing 03/11/18   [provider]  ALPRAZolam Prudy Feeler) 1 MG tablet Take 0.5 mg by mouth 3 (three) times daily as needed for anxiety or sleep.  08/21/13   [provider]  aspirin 81 MG tablet Take 81 mg by mouth daily.    [provider]  azithromycin (ZITHROMAX) 250 MG tablet Take 2 tabs on first day, then take 1 tab daily. [employer]  entire supply. 09/06/20   Janeece Agee, NP  betamethasone dipropionate 0.05 % lotion Apply 1 application topically as needed.  04/10/20   [provider]  cetirizine (ZYRTEC) 10 MG tablet Take 1 tablet (10 mg total) by mouth daily. 10/18/19   Doristine Bosworth, MD  Continuous Blood Gluc Receiver (DEXCOM G6 RECEIVER) DEVI 1 Device by Does not apply route every morning. Device and supplies 04/18/20   Shade Flood, MD  Dulaglutide (TRULICITY) 1.5 MG/0.5ML SOPN Inject 1.5 mg into the skin once a week. 09/06/20   Janeece Agee, NP  DULoxetine (CYMBALTA) 60 MG capsule TAKE 2 CAPSULES BY MOUTH DAILY 09/20/20   Shade Flood, MD  insulin glargine (LANTUS  SOLOSTAR) 100 UNIT/ML Solostar Pen Inject 20 Units into the skin daily. 10/08/20   Janeece Agee, NP  Insulin Pen Needle 31G X 5 MM MISC Inject 1 each into the skin daily. 09/06/20   Janeece Agee, NP  levothyroxine (SYNTHROID) 88 MCG tablet TAKE 1 TABLET (88 MCG TOTAL) BY MOUTH DAILY BEFORE BREAKFAST. 08/22/20   Shade Flood, MD  losartan (COZAAR) 50 MG tablet Take 1 tablet (50 mg total) by mouth every evening. 10/18/20 01/16/21  Shade Flood, MD  metoprolol tartrate (LOPRESSOR) 50 MG tablet TAKE 1 TABLET BY MOUTH TWICE A DAY 09/08/20   Shade Flood, MD  nitrofurantoin, macrocrystal-monohydrate, (MACROBID) 100 MG capsule Take 1 capsule (100 mg total) by mouth 2 (two) times daily. 07/08/20   Shade Flood, MD  nitroGLYCERIN (NITROSTAT) 0.4 MG SL tablet PLACE 1 TABLET UNDER THE TONGUE EVERY 5 MINUTES AS NEEDED FOR CHEST PAIN. IF YOU REQUIRE MORE THAN TWO TABLETS FIVE MINUTES APART GO TO THE NEAREST ER VIA EMS. 07/29/20   Tolia, Sunit, DO  ondansetron (ZOFRAN ODT) 4 MG disintegrating tablet Take 1 tablet (4 mg total) by mouth every 8 (eight) hours as needed for nausea or vomiting. 07/14/19   Collie Siad A, MD  pravastatin (PRAVACHOL) 80 MG tablet TAKE 1 TABLET (80 MG TOTAL) BY MOUTH EVERY EVENING. 10/21/20 01/19/21  Tolia, Sunit, DO  sitaGLIPtin (JANUVIA) 100 MG tablet Take 1 tablet (100 mg total) by mouth daily. 09/06/20   Janeece Agee, NP  triamcinolone cream (KENALOG) 0.5 % Apply 1 application topically 3 (three) times daily. Apply to back and forearms, avoid the face 10/18/19   Doristine Bosworth, MD  Vitamin D, Ergocalciferol, (DRISDOL) 1.25 MG (50000 UNIT) CAPS capsule TAKE 1 CAPSULE (50,000 UNITS TOTAL) BY MOUTH EVERY 7 (SEVEN) DAYS. 10/04/20   Shade Flood, MD   Social History   Socioeconomic History  . Marital status: Married    Spouse name: Not on file  . Number of children: 2  . Years of education: Not on file  . Highest education level: Not on file  Occupational  History  . Not on file  Tobacco Use  . Smoking status: Current Every Day Smoker    Packs/day: 0.75    Years: 38.00    Pack years: 28.50    Types: Cigarettes  . Smokeless tobacco: Never Used  Vaping Use  . Vaping Use: Former  Substance and Sexual Activity  . Alcohol use: No    Alcohol/week: 0.0 standard drinks  . Drug use: No  . Sexual activity: Yes  Other Topics Concern  . 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 Difficulty of Paying Living Expenses: Not on file  Food Insecurity:   . Worried About Programme researcher, broadcasting/film/video in the Last Year: Not on file  . Ran Out of Food in the Last Year: Not on file  Transportation Needs:   . Lack of Transportation (Medical): Not on file  . Lack of Transportation (Non-Medical): Not on file  Physical Activity:   . Days of Exercise per Week: Not on file  . Minutes of Exercise per Session: Not on file  Stress:   . Feeling of Stress : Not on file  Social Connections:   . Frequency of Communication with Friends and Family: Not on file  . Frequency of Social Gatherings with Friends and Family: Not on file  . Attends Religious Services: Not on file  . Active Member of Clubs or Organizations: Not on file  . Attends Banker Meetings: Not on file  . Marital Status: Not on file  Intimate Partner Violence:   . Fear of Current or Ex-Partner: Not on file  . Emotionally Abused: Not on file  . Physically Abused: Not on file  . Sexually Abused: Not on file    Review of Systems   Objective:   Vitals:   11/06/20 1442  BP: (!) 148/87  Pulse: 90  Temp: (!) 97.4 F (36.3 C)  TempSrc: Temporal  SpO2: 96%  Weight: 165 lb (74.8 kg)  Height: 5' (1.524 m)    Physical  Exam Constitutional:      General: She is not in acute distress.    Appearance: Normal appearance. She is well-developed. She is not ill-appearing or toxic-appearing.  HENT:     Head: Normocephalic and atraumatic.  Pulmonary:     Effort: Pulmonary effort is normal.  Abdominal:     General: There is no distension.     Palpations: Abdomen is soft.     Tenderness: There is no abdominal tenderness. There is no right CVA tenderness, left CVA tenderness, guarding or rebound.  Skin:    General: Skin is warm.  Neurological:     Mental Status: She is alert and oriented to person, place, and time.  Psychiatric:        Behavior: Behavior normal.      Results for orders placed or performed in visit on 11/06/20  POCT urinalysis dipstick  Result Value Ref Range   Color, UA yellow yellow   Clarity, UA cloudy (A) clear   Glucose, UA negative negative mg/dL   Bilirubin, UA negative negative   Ketones, POC UA negative negative mg/dL   Spec Grav, UA 8.338 2.505 - 1.025   Blood, UA large (A) negative   pH, UA 5.5 5.0 - 8.0   Protein Ur, POC =100 (A) negative mg/dL   Urobilinogen, UA 0.2 0.2 or 1.0 E.U./dL   Nitrite, UA Negative Negative   Leukocytes, UA Large (3+) (A) Negative  POCT Microscopic Urinalysis ([facility])  Result Value Ref Range   WBC,UR,HPF,POC Too numerous to count  (A) None WBC/hpf   RBC,UR,HPF,POC Moderate (A) None RBC/hpf   Bacteria Few (A) None, Too numerous to count   Mucus Absent Absent   Epithelial Cells, UR Per Microscopy None None, Too numerous to count cells/hpf     Assessment & Plan:  OCEANE FOSSE is  a 64 y.o. female . Urinary tract infection with hematuria, site unspecified - Plan: sulfamethoxazole-trimethoprim (BACTRIM DS) 800-160 MG tablet, Urine Culture UTI symptoms - Plan: POCT urinalysis dipstick, POCT Microscopic Urinalysis ([facility]), sulfamethoxazole-trimethoprim (BACTRIM DS) 800-160 MG tablet  -Hemorrhagic cystitis likely.  Has had some low-grade temps, some  systemic symptoms with headache, episodic myalgias.  Afebrile in office, no CVA tenderness or abdominal tenderness.  Start Septra DS, 7-day supply, potential side effects discussed, check urine culture, RTC/ER precautions.  Uncontrolled type 2 diabetes mellitus with hyperglycemia (HCC) - Plan: Dulaglutide (TRULICITY) 3 MG/0.5ML SOPN  -Decreased control, will adjust Trulicity to 3 mg/week.  Stop Januvia as on a GLP-1.  Continue Lantus 38 units for now but if elevated readings prior to next visit, would recommend increasing Lantus as well.   She will call if that is the case.  Recheck 1 month.  A1c at that time  Meds ordered this encounter  Medications  . Dulaglutide (TRULICITY) 3 MG/0.5ML SOPN    Sig: Inject 3 mg as directed once a week.    Dispense:  6 mL    Refill:  1  . sulfamethoxazole-trimethoprim (BACTRIM DS) 800-160 MG tablet    Sig: Take 1 tablet by mouth 2 (two) times daily.    Dispense:  14 tablet    Refill:  0   Patient Instructions    Continue lantus 38 units per day, increase the Trulicity to  injection per week. That was sent to your pharmacy. Ok to stop Januvia at this time.  Keep a record of your blood sugars, fasting or 2 hours after meal - bring to next visit in 1 month for diabetes.  If readings are increasing - let me know and we can adjust insulin as well.   Start antibiotic sulfamethoxazole 1 pill twice per day for the next 1 week.  See information below.  I expect your symptoms to be improving through the weekend or into early next week.  If any worsening symptoms, be seen here or other care provider.  Return to the clinic or go to the nearest emergency room if any of your symptoms worsen or new symptoms occur.   Urinary Tract Infection, Adult  A urinary tract infection (UTI) is an infection of any part of the urinary tract. The urinary tract includes the kidneys, ureters, bladder, and urethra. These organs make, store, and get rid of urine in the body. Your  health care provider may use other names to describe the infection. An upper UTI affects the ureters and kidneys (pyelonephritis). A lower UTI affects the bladder (cystitis) and urethra (urethritis). What are the causes? Most urinary tract infections are caused by bacteria in your genital area, around the entrance to your urinary tract (urethra). These bacteria grow and cause inflammation of your urinary tract. What increases the risk? You are more likely to develop this condition if:  You have a urinary catheter that stays in place (indwelling).  You are not able to control when you urinate or have a bowel movement (you have incontinence).  You are female and you: ? Use a spermicide or diaphragm for birth control. ? Have low estrogen levels. ? Are pregnant.  You have certain genes that increase your risk (genetics).  You are sexually active.  You take antibiotic medicines.  You have a condition that causes your flow of urine to slow down, such as: ? An enlarged prostate, if you are female. ? Blockage in your urethra (stricture). ? A kidney stone. ?  A nerve condition that affects your bladder control (neurogenic bladder). ? Not getting enough to drink, or not urinating often.  You have certain medical conditions, such as: ? Diabetes. ? A weak disease-fighting system (immunesystem). ? Sickle cell disease. ? Gout. ? Spinal cord injury. What are the signs or symptoms? Symptoms of this condition include:  Needing to urinate right away (urgently).  Frequent urination or passing small amounts of urine frequently.  Pain or burning with urination.  Blood in the urine.  Urine that smells bad or unusual.  Trouble urinating.  Cloudy urine.  Vaginal discharge, if you are female.  Pain in the abdomen or the lower back. You may also have:  Vomiting or a decreased appetite.  Confusion.  Irritability or tiredness.  A fever.  Diarrhea. The first symptom in older adults  may be confusion. In some cases, they may not have any symptoms until the infection has worsened. How is this diagnosed? This condition is diagnosed based on your medical history and a physical exam. You may also have other tests, including:  Urine tests.  Blood tests.  Tests for sexually transmitted infections (STIs). If you have had more than one UTI, a cystoscopy or imaging studies may be done to determine the cause of the infections. How is this treated? Treatment for this condition includes:  Antibiotic medicine.  Over-the-counter medicines to treat discomfort.  Drinking enough water to stay hydrated. If you have frequent infections or have other conditions such as a kidney stone, you may need to see a health care provider who specializes in the urinary tract (urologist). In rare cases, urinary tract infections can cause sepsis. Sepsis is a life-threatening condition that occurs when the body responds to an infection. Sepsis is treated in the hospital with IV antibiotics, fluids, and other medicines. Follow these instructions at home:  Medicines  Take over-the-counter and prescription medicines only as told by your health care provider.  If you were prescribed an antibiotic medicine, take it as told by your health care provider. Do not stop using the antibiotic even if you start to feel better. General instructions  Make sure you: ? Empty your bladder often and completely. Do not hold urine for long periods of time. ? Empty your bladder after sex. ? Wipe from front to back after a bowel movement if you are female. Use each tissue one time when you wipe.  Drink enough fluid to keep your urine pale yellow.  Keep all follow-up visits as told by your health care provider. This is important. Contact a health care provider if:  Your symptoms do not get better after 1-2 days.  Your symptoms go away and then return. Get help right away if you have:  Severe pain in your back  or your lower abdomen.  A fever.  Nausea or vomiting. Summary  A urinary tract infection (UTI) is an infection of any part of the urinary tract, which includes the kidneys, ureters, bladder, and urethra.  Most urinary tract infections are caused by bacteria in your genital area, around the entrance to your urinary tract (urethra).  Treatment for this condition often includes antibiotic medicines.  If you were prescribed an antibiotic medicine, take it as told by your health care provider. Do not stop using the antibiotic even if you start to feel better.  Keep all follow-up visits as told by your health care provider. This is important. This information is not intended to replace advice given to you by your health care  provider. Make sure you discuss any questions you have with your health care provider. Document Revised: 11/03/2018 Document Reviewed: 05/26/2018 Elsevier Patient Education  [employer].     If you have lab work done today you will be contacted with your lab results within the next 2 weeks.  If you have not heard from [place] then please contact us. The fastest way to get your results is to register for My Chart.   IF you received an x-ray today, you will receive an invoice from [facility]. Please contact [facility] at [phone] with questions or concerns regarding your invoice.   IF you received labwork today, you will receive an invoice from [facility]. Please contact [facility] at [phone] with questions or concerns regarding your invoice.   Our billing staff will not be able to assist you with questions regarding bills from these companies.  You will be contacted with the lab results as soon as they are available. The fastest way to get your results is to activate your My Chart account. Instructions are located on the last page of this paperwork. If you have not heard from [place] regarding the results in 2 weeks, please contact this office.          Signed, Meredith Staggers, MD [facility]

## 2020-11-11 ENCOUNTER — Encounter: Payer: Self-pay | Admitting: Family Medicine

## 2020-11-11 ENCOUNTER — Other Ambulatory Visit: Payer: Self-pay

## 2020-11-11 ENCOUNTER — Ambulatory Visit: Payer: Self-pay

## 2020-11-11 ENCOUNTER — Telehealth (INDEPENDENT_AMBULATORY_CARE_PROVIDER_SITE_OTHER): Payer: Medicare Other | Admitting: Family Medicine

## 2020-11-11 VITALS — Temp 100.3°F | Ht 60.0 in | Wt 165.0 lb

## 2020-11-11 DIAGNOSIS — R0989 Other specified symptoms and signs involving the circulatory and respiratory systems: Secondary | ICD-10-CM

## 2020-11-11 DIAGNOSIS — R509 Fever, unspecified: Secondary | ICD-10-CM

## 2020-11-11 LAB — URINE CULTURE

## 2020-11-11 NOTE — Progress Notes (Signed)
Virtual Visit via Telephone Note  I connected with Felicia Hanson on 11/11/20 at 5:50 PM by telephone and verified that I am speaking with the correct person using two identifiers. Patient location: home.  My location: office   I discussed the limitations, risks, security and privacy concerns of performing an evaluation and management service by telephone and the availability of in person appointments. I also discussed with the patient that there may be a patient responsible charge related to this service. The patient expressed understanding and agreed to proceed, consent obtained  Chief complaint: Chief Complaint  Patient presents with  . URI    Pt reports low grade fever, chest congestion,and night sweats. Pt reports this has been going on for a few days ago. Pt hasn't had a COVID-19 test as of yet.     History of Present Illness:  Recently treated with Septra for urinary tract infection. Started feeling better within 2-s days. Urinating better, clear urine, headaches resolved and legs not hurting. No abd pain, no n/v or back pain.     New symptoms as above.  Woke up yesterday with sweats and temp ok. No congestion yesterday.  Temp 100.3 this am with sweats. Min chest congestion, slight wheeze this am, not now. Congestion at bottom of throat.  Min dyspnea ths am, not now. Has albuterol if needed - not using.  No bodyaches. No change in taste/smell.  Drinking fluids and eating ok.  Grandchildren around her - 31 and 54 year old around her with some congestion/sneezing.  Had covid vaccine in Jan/Feb. Plans on booster soon.  Has a zpak that she has not taken.  She has been around newborn baby. Plans to avoid contact for now.     Patient Active Problem List   Diagnosis Date Noted  . Type 2 diabetes mellitus without complication, without long-term current use of insulin (HCC) 10/26/2017  . Generalized anxiety disorder 01/10/2014  . Vitamin D deficiency 01/10/2014  . Essential  hypertension, benign 01/10/2014  . Pure hypercholesterolemia 01/10/2014  . Hypothyroidism 01/10/2014   Past Medical History:  Diagnosis Date  . Anxiety   . Coronary artery calcification   . Depression   . Diabetes mellitus without complication (HCC)   . Hyperlipidemia   . Hypertension   . Thyroid disease    Past Surgical History:  Procedure Laterality Date  . BREAST EXCISIONAL BIOPSY Left   . CHOLECYSTECTOMY    . COLON SURGERY     diverticulitis with perforation; s/p colon resection.  . Colonoscopy    . TUBAL LIGATION     Allergies  Allergen Reactions  . Metformin And Related Anaphylaxis    Could not swallow  . Ace Inhibitors Cough    Cough with lisinopril.    Prior to Admission medications   Medication Sig Start Date End Date Taking? Authorizing Provider  ACCU-CHEK AVIVA PLUS test strip daily. for testing 03/11/18  Yes [provider]  ALPRAZolam Prudy Feeler) 1 MG tablet Take 0.5 mg by mouth 3 (three) times daily as needed for anxiety or sleep.  08/21/13  Yes [provider]  aspirin 81 MG tablet Take 81 mg by mouth daily.   Yes [provider]  betamethasone dipropionate 0.05 % lotion Apply 1 application topically as needed.  04/10/20  Yes [provider]  cetirizine (ZYRTEC) 10 MG tablet Take 1 tablet (10 mg total) by mouth daily. 10/18/19  Yes Doristine Bosworth, MD  Continuous Blood Gluc Receiver (DEXCOM G6 RECEIVER) DEVI 1  Device by Does not apply route every morning. Device and supplies 04/18/20  Yes Shade Flood, MD  Dulaglutide (TRULICITY) 3 MG/0.5ML SOPN Inject 3 mg as directed once a week. 11/06/20  Yes Shade Flood, MD  DULoxetine (CYMBALTA) 60 MG capsule TAKE 2 CAPSULES BY MOUTH DAILY 09/20/20  Yes Shade Flood, MD  insulin glargine (LANTUS SOLOSTAR) 100 UNIT/ML Solostar Pen Inject 20 Units into the skin daily. 10/08/20  Yes Janeece Agee, NP  Insulin Pen Needle 31G X 5 MM MISC Inject 1 each into the skin daily. 09/06/20   Yes Janeece Agee, NP  levothyroxine (SYNTHROID) 88 MCG tablet TAKE 1 TABLET (88 MCG TOTAL) BY MOUTH DAILY BEFORE BREAKFAST. 08/22/20  Yes Shade Flood, MD  losartan (COZAAR) 50 MG tablet Take 1 tablet (50 mg total) by mouth every evening. 10/18/20 01/16/21 Yes Shade Flood, MD  metoprolol tartrate (LOPRESSOR) 50 MG tablet TAKE 1 TABLET BY MOUTH TWICE A DAY 09/08/20  Yes Shade Flood, MD  nitroGLYCERIN (NITROSTAT) 0.4 MG SL tablet PLACE 1 TABLET UNDER THE TONGUE EVERY 5 MINUTES AS NEEDED FOR CHEST PAIN. IF YOU REQUIRE MORE THAN TWO TABLETS FIVE MINUTES APART GO TO THE NEAREST ER VIA EMS. 07/29/20  Yes Tolia, Sunit, DO  ondansetron (ZOFRAN ODT) 4 MG disintegrating tablet Take 1 tablet (4 mg total) by mouth every 8 (eight) hours as needed for nausea or vomiting. 07/14/19  Yes Stallings, Zoe A, MD  pravastatin (PRAVACHOL) 80 MG tablet TAKE 1 TABLET (80 MG TOTAL) BY MOUTH EVERY EVENING. 10/21/20 01/19/21 Yes Tolia, Sunit, DO  sulfamethoxazole-trimethoprim (BACTRIM DS) 800-160 MG tablet Take 1 tablet by mouth 2 (two) times daily. 11/06/20  Yes Shade Flood, MD  triamcinolone cream (KENALOG) 0.5 % Apply 1 application topically 3 (three) times daily. Apply to back and forearms, avoid the face 10/18/19  Yes Stallings, Zoe A, MD  Vitamin D, Ergocalciferol, (DRISDOL) 1.25 MG (50000 UNIT) CAPS capsule TAKE 1 CAPSULE (50,000 UNITS TOTAL) BY MOUTH EVERY 7 (SEVEN) DAYS. 10/04/20  Yes Shade Flood, MD   Social History   Socioeconomic History  . Marital status: Married    Spouse name: Not on file  . Number of children: 2  . Years of education: Not on file  . Highest education level: Not on file  Occupational History  . Not on file  Tobacco Use  . Smoking status: Current Every Day Smoker    Packs/day: 0.75    Years: 38.00    Pack years: 28.50    Types: Cigarettes  . Smokeless tobacco: Never Used  Vaping Use  . Vaping Use: Former  Substance and Sexual Activity  . Alcohol use: No     Alcohol/week: 0.0 standard drinks  . Drug use: No  . Sexual activity: Yes  Other Topics Concern  . 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 No cough during phone encounter.  Appropriate responses. All questions answered and understanding expressed.   Assessment and Plan: Fever, unspecified - Plan: COVID-19, Flu A+B and RSV  Chest congestion Low grade fever this am, possible early viral illness. Mild illness at present. Has not received covid 19 booster.   -Symptomatic care discussed with Mucinex, fluids, Tylenol, ER/RTC precautions given.  -Covid, flu, RSV testing tomorrow.  Avoid contact with newborn child until results known and until symptoms have improved.     Follow Up Instructions:    I discussed the assessment and treatment plan with the patient. The patient was provided an opportunity to ask questions and all were answered. The patient agreed with the plan and demonstrated an understanding of the instructions.   The patient was advised to call back or seek an in-person evaluation if the symptoms worsen or if the condition fails to improve as anticipated.  I provided 12 minutes of non-face-to-face time during this encounter.  Signed,   Meredith Staggers, MD [facility].   11/11/20

## 2020-11-11 NOTE — Patient Instructions (Signed)
° ° ° °  If you have lab work done today you will be contacted with your lab results within the next 2 weeks.  If you have not heard from us then please contact us. The fastest way to get your results is to register for My Chart. ° ° °IF you received an x-ray today, you will receive an invoice from [facility]. Please contact [facility] at [phone] with questions or concerns regarding your invoice.  ° °IF you received labwork today, you will receive an invoice from [facility]. Please contact [facility] at [phone] with questions or concerns regarding your invoice.  ° °Our billing staff will not be able to assist you with questions regarding bills from these companies. ° °You will be contacted with the lab results as soon as they are available. The fastest way to get your results is to activate your My Chart account. Instructions are located on the last page of this paperwork. If you have not heard from us regarding the results in 2 weeks, please contact this office. °  ° ° ° °

## 2020-11-11 NOTE — Telephone Encounter (Signed)
Patient called and says she has a fever 100.3 with chest congestion. She says she's concerned about this because she's around her grandchildren and wants to make sure she's ok before seeing them. She says the fever started this morning. She says she's not coughing, but can feel the chest congestion. She says she will take Mucinex for that. She says she's on medication for a UTI and has a z-pack that Dr. Neva Seat gave that she didn't take because she was feeling better. She wants to know if she should take the z-pack for the congestion and fever. She denies any other symptoms. She mentioned about being tested for COVID, but did not schedule. I called the office and spoke to Maralyn Sago, [facility] to speak to the patient and schedule a virtual appointment. The call was connected successfully.  Answer Assessment - Initial Assessment Questions 1. TEMPERATURE: "What is the most recent temperature?"  "How was it measured?"      100.3 2. ONSET: "When did the fever start?"      This morning 3. SYMPTOMS: "Do you have any other symptoms besides the fever?"  (e.g., colds, headache, sore throat, earache, cough, rash, diarrhea, vomiting, abdominal pain)     Chest congestion 4. CAUSE: If there are no symptoms, ask: "What do you think is causing the fever?"      I don't know 5. CONTACTS: "Does anyone else in the family have an infection?"     No 6. TREATMENT: "What have you done so far to treat this fever?" (e.g., medications)     No 7. IMMUNOCOMPROMISE: "Do you have of the following: diabetes, HIV positive, splenectomy, cancer chemotherapy, chronic steroid treatment, transplant patient, etc."     Diabetes 8. PREGNANCY: "Is there any chance you are pregnant?" "When was your last menstrual period?"     No 9. TRAVEL: "Have you traveled out of the country in the last month?" (e.g., travel history, exposures)     No  Protocols used: FEVER-A-AH

## 2020-11-13 LAB — COVID-19, FLU A+B AND RSV
Influenza A, NAA: NOT DETECTED
Influenza B, NAA: NOT DETECTED
RSV, NAA: NOT DETECTED
SARS-CoV-2, NAA: NOT DETECTED

## 2020-11-14 ENCOUNTER — Telehealth: Payer: Self-pay | Admitting: Family Medicine

## 2020-11-14 NOTE — Telephone Encounter (Signed)
Patient finished antibiotic last night for UTI. Still running low grade fever. Please advise at [phone].

## 2020-11-15 NOTE — Telephone Encounter (Signed)
Pt has finished the round of antibiotics for the UTI. Pt is still having a low fever. Please advise

## 2020-11-15 NOTE — Telephone Encounter (Signed)
I called with covid results last night and advised to be seen if fever not resolved today.

## 2020-11-18 ENCOUNTER — Other Ambulatory Visit: Payer: Self-pay | Admitting: Family Medicine

## 2020-11-18 DIAGNOSIS — E1165 Type 2 diabetes mellitus with hyperglycemia: Secondary | ICD-10-CM

## 2020-11-18 MED ORDER — LANTUS SOLOSTAR 100 UNIT/ML ~~LOC~~ SOPN: 20.0000 [IU] | PEN_INJECTOR | Freq: Every day | SUBCUTANEOUS | 3 refills | Status: DC

## 2020-11-18 NOTE — Telephone Encounter (Signed)
Medication:  insulin glargine (LANTUS SOLOSTAR) 100 UNIT/ML Solostar Pen [[id number]]   Has the patient contacted their pharmacy? Yes  (Agent: If yes, when and what did the pharmacy advise?) to call the office   Preferred Pharmacy (with phone number or street name):  [facility] [phone] [place], [place] - [address]  [address], [place] [address]  Phone:  [phone] Fax:  [phone]  Agent: Please be advised that RX refills may take up to 3 business days. We ask that you follow-up with your pharmacy.

## 2020-12-03 ENCOUNTER — Telehealth: Payer: Self-pay | Admitting: Family Medicine

## 2020-12-03 NOTE — Telephone Encounter (Signed)
Pt is calling to ask a question / you usually run 97.4 or 97.6 Temp patient / is it ok to get vaccine , she is getting booster shot tomorrow

## 2020-12-04 NOTE — Telephone Encounter (Signed)
Pt already got the booster shot and will get the flu vaccine next week. Her temp today was 98.2.

## 2020-12-06 ENCOUNTER — Ambulatory Visit (INDEPENDENT_AMBULATORY_CARE_PROVIDER_SITE_OTHER): Payer: Medicare Other | Admitting: Family Medicine

## 2020-12-06 ENCOUNTER — Other Ambulatory Visit: Payer: Self-pay

## 2020-12-06 ENCOUNTER — Encounter: Payer: Self-pay | Admitting: Family Medicine

## 2020-12-06 VITALS — BP 143/80 | HR 85 | Temp 97.9°F | Ht 60.0 in | Wt 167.0 lb

## 2020-12-06 DIAGNOSIS — I10 Essential (primary) hypertension: Secondary | ICD-10-CM

## 2020-12-06 DIAGNOSIS — R8290 Unspecified abnormal findings in urine: Secondary | ICD-10-CM

## 2020-12-06 DIAGNOSIS — F418 Other specified anxiety disorders: Secondary | ICD-10-CM

## 2020-12-06 DIAGNOSIS — E7800 Pure hypercholesterolemia, unspecified: Secondary | ICD-10-CM | POA: Diagnosis not present

## 2020-12-06 DIAGNOSIS — B9620 Unspecified Escherichia coli [E. coli] as the cause of diseases classified elsewhere: Secondary | ICD-10-CM

## 2020-12-06 DIAGNOSIS — N390 Urinary tract infection, site not specified: Secondary | ICD-10-CM

## 2020-12-06 DIAGNOSIS — E1165 Type 2 diabetes mellitus with hyperglycemia: Secondary | ICD-10-CM

## 2020-12-06 DIAGNOSIS — R399 Unspecified symptoms and signs involving the genitourinary system: Secondary | ICD-10-CM

## 2020-12-06 DIAGNOSIS — R319 Hematuria, unspecified: Secondary | ICD-10-CM

## 2020-12-06 DIAGNOSIS — R52 Pain, unspecified: Secondary | ICD-10-CM | POA: Diagnosis not present

## 2020-12-06 DIAGNOSIS — T50Z95A Adverse effect of other vaccines and biological substances, initial encounter: Secondary | ICD-10-CM

## 2020-12-06 DIAGNOSIS — E034 Atrophy of thyroid (acquired): Secondary | ICD-10-CM

## 2020-12-06 LAB — POCT URINALYSIS DIP (MANUAL ENTRY)
Glucose, UA: NEGATIVE mg/dL
Nitrite, UA: NEGATIVE
Protein Ur, POC: 100 mg/dL — AB
Spec Grav, UA: 1.025 (ref 1.010–1.025)
Urobilinogen, UA: 2 E.U./dL — AB
pH, UA: 6 (ref 5.0–8.0)

## 2020-12-06 LAB — POC MICROSCOPIC URINALYSIS (UMFC)

## 2020-12-06 MED ORDER — DULOXETINE HCL 60 MG PO CPEP: 120.0000 mg | ORAL_CAPSULE | Freq: Every day | ORAL | 1 refills | Status: DC

## 2020-12-06 MED ORDER — LOSARTAN POTASSIUM 100 MG PO TABS: 100.0000 mg | ORAL_TABLET | Freq: Every evening | ORAL | 1 refills | Status: DC

## 2020-12-06 MED ORDER — LEVOTHYROXINE SODIUM 88 MCG PO TABS
ORAL_TABLET | ORAL | 1 refills | Status: DC
Start: 2020-12-06 — End: 2021-01-19

## 2020-12-06 MED ORDER — SULFAMETHOXAZOLE-TRIMETHOPRIM 800-160 MG PO TABS: 1.0000 | ORAL_TABLET | Freq: Two times a day (BID) | ORAL | 0 refills | Status: DC

## 2020-12-06 MED ORDER — METOPROLOL TARTRATE 50 MG PO TABS: 50.0000 mg | ORAL_TABLET | Freq: Two times a day (BID) | ORAL | 1 refills | Status: DC

## 2020-12-06 MED ORDER — PRAVASTATIN SODIUM 80 MG PO TABS: 80.0000 mg | ORAL_TABLET | Freq: Every evening | ORAL | 1 refills | Status: DC

## 2020-12-06 NOTE — Progress Notes (Signed)
Subjective:  Patient ID: Felicia Hanson, female    DOB: 1956/06/13  Age: 65 y.o. MRN: [id number]  CC:  Chief Complaint  Patient presents with   Diabetes    Pt reports no issues with this condition since last OV.Pt hasn't had any hypo or hyper glycemic episodes since last OV. Pt has monitored her BS since last OV.  PT is wanting a COVID test if possible pt hasn't had any symptoms nor has she been exposed that she knows of. Pt states she heard on TV everyone should get tested, so she is wondering if that's possible.    HPI Felicia Hanson presents for   Diabetes: Complicated by hyperglycemia, microalbuminuria.  Treated with Lantus, Trulicity, Januvia prior. she is on ARB as cough with ACE inhibitor, and takes pravastatin.  A1c had improved from 11.5 in August to 9.1 in October. 38 units Lantus at December visit, Trulicity 1.5 mg weekly increased to 3 mg/week and stop Januvia as she was on GLP-1.  Best reading in evening few hrs after dinner:  Fasting 120, 117,  2hr PP: 114, 126, occasionally in 200's. No symptomatic lows. Lowest 114.  Microalbumin:  Optho, foot exam, pneumovax:  Saw optho few months ago.  No flu vaccine- plans next week.   Had covid booster vaccine 2 days ago. Some bodyaches and low grade temp for 1-2 days - 99.5. no cough/congestion. Husband has had cough recently.  Neighbor had covid, no sick contacts, no holiday travel.   Lab Results  Component Value Date   HGBA1C 9.1 (A) 09/06/2020   HGBA1C 11.5 (A) 07/08/2020   HGBA1C 11.8 (H) 04/03/2020   Lab Results  Component Value Date   LDLCALC 98 04/03/2020   CREATININE 0.76 06/10/2020   No new side effects with BP/chol meds.  Snack on way here today.   BP Readings from Last 3 Encounters:  12/06/20 (!) 143/80  11/06/20 (!) 148/87  07/08/20 140/88   Depression: cymbalta working well - denies recent anxiety.   Depression screen [facility] 2/9 12/06/2020 11/11/2020 11/06/2020 09/06/2020 07/08/2020  Decreased Interest 0 0  0 0 0  Down, Depressed, Hopeless 0 0 0 1 1  PHQ - 2 Score 0 0 0 1 1  Altered sleeping - - - - -  Tired, decreased energy - - - - -  Change in appetite - - - - -  Feeling bad or failure about yourself  - - - - -  Trouble concentrating - - - - -  Moving slowly or fidgety/restless - - - - -  Suicidal thoughts - - - - -  PHQ-9 Score - - - - -  Difficult doing work/chores - - - - -  Some recent data might be hidden     Urinary tract infection: Treated 11/06/20,  E coli UTI. septra DS BID for 1 week.  Improved.  Has noticed return of some cloudy urine, dark urine at times past week.  Low grade temp 99 after covid booster, around 99 before booster?  No dysuria/urgency/frequency. nausea, no vomiting. No abd pain/fank pain.  Color of urine improves with water.    Hx of COPD. No recent sx's.  Treated with bevespri in past - off for years, no recent flare of COPD.  Has albuterol if needed, but has not needed recently.   History Patient Active Problem List   Diagnosis Date Noted   Type 2 diabetes mellitus without complication, without long-term current use of insulin (HCC) 10/26/2017  Generalized anxiety disorder 01/10/2014   Vitamin D deficiency 01/10/2014   Essential hypertension, benign 01/10/2014   Pure hypercholesterolemia 01/10/2014   Hypothyroidism 01/10/2014   Past Medical History:  Diagnosis Date   Anxiety    Coronary artery calcification    Depression    Diabetes mellitus without complication (HCC)    Hyperlipidemia    Hypertension    Thyroid disease    Past Surgical History:  Procedure Laterality Date   BREAST EXCISIONAL BIOPSY Left    CHOLECYSTECTOMY     COLON SURGERY     diverticulitis with perforation; s/p colon resection.   Colonoscopy     TUBAL LIGATION     Allergies  Allergen Reactions   Metformin And Related Anaphylaxis    Could not swallow   Ace Inhibitors Cough    Cough with lisinopril.    Prior to Admission medications    Medication Sig Start Date End Date Taking? Authorizing Provider  ACCU-CHEK AVIVA PLUS test strip daily. for testing 03/11/18  Yes [provider]  ALPRAZolam Prudy Feeler) 1 MG tablet Take 0.5 mg by mouth 3 (three) times daily as needed for anxiety or sleep.  08/21/13  Yes [provider]  aspirin 81 MG tablet Take 81 mg by mouth daily.   Yes [provider]  betamethasone dipropionate 0.05 % lotion Apply 1 application topically as needed.  04/10/20  Yes [provider]  cetirizine (ZYRTEC) 10 MG tablet Take 1 tablet (10 mg total) by mouth daily. 10/18/19  Yes Doristine Bosworth, MD  Continuous Blood Gluc Receiver (DEXCOM G6 RECEIVER) DEVI 1 Device by Does not apply route every morning. Device and supplies 04/18/20  Yes Shade Flood, MD  Dulaglutide (TRULICITY) 3 MG/0.5ML SOPN Inject 3 mg as directed once a week. 11/06/20  Yes Shade Flood, MD  DULoxetine (CYMBALTA) 60 MG capsule TAKE 2 CAPSULES BY MOUTH DAILY 09/20/20  Yes Shade Flood, MD  insulin glargine (LANTUS SOLOSTAR) 100 UNIT/ML Solostar Pen Inject 20 Units into the skin daily. 11/18/20  Yes Shade Flood, MD  Insulin Pen Needle 31G X 5 MM MISC Inject 1 each into the skin daily. 09/06/20  Yes Janeece Agee, NP  levothyroxine (SYNTHROID) 88 MCG tablet TAKE 1 TABLET (88 MCG TOTAL) BY MOUTH DAILY BEFORE BREAKFAST. 08/22/20  Yes Shade Flood, MD  losartan (COZAAR) 50 MG tablet Take 1 tablet (50 mg total) by mouth every evening. 10/18/20 01/16/21 Yes Shade Flood, MD  metoprolol tartrate (LOPRESSOR) 50 MG tablet TAKE 1 TABLET BY MOUTH TWICE A DAY 09/08/20  Yes Shade Flood, MD  nitroGLYCERIN (NITROSTAT) 0.4 MG SL tablet PLACE 1 TABLET UNDER THE TONGUE EVERY 5 MINUTES AS NEEDED FOR CHEST PAIN. IF YOU REQUIRE MORE THAN TWO TABLETS FIVE MINUTES APART GO TO THE NEAREST ER VIA EMS. 07/29/20  Yes Tolia, Sunit, DO  ondansetron (ZOFRAN ODT) 4 MG disintegrating tablet Take 1 tablet (4 mg total) by  mouth every 8 (eight) hours as needed for nausea or vomiting. 07/14/19  Yes Stallings, Zoe A, MD  pravastatin (PRAVACHOL) 80 MG tablet TAKE 1 TABLET (80 MG TOTAL) BY MOUTH EVERY EVENING. 10/21/20 01/19/21 Yes Tolia, Sunit, DO  sulfamethoxazole-trimethoprim (BACTRIM DS) 800-160 MG tablet Take 1 tablet by mouth 2 (two) times daily. 11/06/20  Yes Shade Flood, MD  triamcinolone cream (KENALOG) 0.5 % Apply 1 application topically 3 (three) times daily. Apply to back and forearms, avoid the face 10/18/19  Yes Doristine Bosworth, MD  Vitamin D,  Ergocalciferol, (DRISDOL) 1.25 MG (50000 UNIT) CAPS capsule TAKE 1 CAPSULE (50,000 UNITS TOTAL) BY MOUTH EVERY 7 (SEVEN) DAYS. 10/04/20  Yes Wendie Agreste, MD   Social History   Socioeconomic History   Marital status: Married    Spouse name: Not on file   Number of children: 2   Years of education: Not on file   Highest education level: Not on file  Occupational History   Not on file  Tobacco Use   Smoking status: Current Every Day Smoker    Packs/day: 0.75    Years: 38.00    Pack years: 28.50    Types: Cigarettes   Smokeless tobacco: Never Used  Vaping Use   Vaping Use: Former  Substance and Sexual Activity   Alcohol use: No    Alcohol/week: 0.0 standard drinks   Drug use: No   Sexual activity: Yes  Other Topics Concern   Not on file  Social History Narrative   Marital status: married      Children:  2 children; 4 grandchildren      Lives: with husband, 2 granddaughters      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  Respiratory:  Negative for chest tightness and shortness of breath.   Cardiovascular: Negative for chest pain, palpitations and leg swelling.  Gastrointestinal: Negative for abdominal pain and blood in stool.  Genitourinary: Negative for difficulty urinating.  Neurological: Negative for dizziness, syncope, light-headedness and headaches.     Objective:   Vitals:   12/06/20 1432  BP: (!) 143/80  Pulse: 85  Temp: 97.9 F (36.6 C)  TempSrc: Temporal  SpO2: 98%  Weight: 167 lb (75.8 kg)  Height: 5' (1.524 m)     Physical Exam Vitals reviewed.  Constitutional:      Appearance: She is well-developed and well-nourished.  HENT:     Head: Normocephalic and atraumatic.  Eyes:     Extraocular Movements: EOM normal.     Conjunctiva/sclera: Conjunctivae normal.     Pupils: Pupils are equal, round, and reactive to light.  Neck:     Vascular: No carotid bruit.  Cardiovascular:     Rate and Rhythm: Normal rate and regular rhythm.     Pulses: Intact distal pulses.     Heart sounds: Normal heart sounds.  Pulmonary:     Effort: Pulmonary effort is normal. No respiratory distress.     Breath sounds: Normal breath sounds. No wheezing or rales.  Abdominal:     Palpations: Abdomen is soft. There is no pulsatile mass.     Tenderness: There is no abdominal tenderness. There is no right CVA tenderness, left CVA tenderness or guarding.  Skin:    General: Skin is warm and dry.  Neurological:     Mental Status: She is alert and oriented to person, place, and time.  Psychiatric:        Mood and Affect: Mood and affect normal.        Behavior: Behavior normal.      37 minutes spent during  visit, greater than 50% counseling and assimilation of information, chart review, and discussion of plan.   Results for orders placed or performed in visit on 12/06/20  POCT urinalysis dipstick  Result Value Ref Range   Color, UA yellow yellow   Clarity, UA cloudy (A) clear   Glucose, UA negative negative mg/dL    Bilirubin, UA small (A) negative   Ketones, POC UA trace (5) (A) negative mg/dL   Spec Grav, UA 7.001 7.494 - 1.025   Blood, UA large (A) negative   pH, UA 6.0 5.0 - 8.0   Protein Ur, POC =100 (A) negative mg/dL   Urobilinogen, UA 2.0 (A) 0.2 or 1.0 E.U./dL   Nitrite, UA Negative Negative   Leukocytes, UA Small (1+) (A) Negative  POCT Microscopic Urinalysis ([facility])  Result Value Ref Range   WBC,UR,HPF,POC Many (A) None WBC/hpf   RBC,UR,HPF,POC Too numerous to count  (A) None RBC/hpf   Bacteria Many (A) None, Too numerous to count   Mucus Present (A) Absent   Epithelial Cells, UR Per Microscopy Few (A) None, Too numerous to count cells/hpf     Assessment & Plan:  ANNTIONETTE MADKINS is a 65 y.o. female . Uncontrolled type 2 diabetes mellitus with hyperglycemia (HCC) - Plan: Hemoglobin A1c  -Tolerating current regimen, check A1c. Anticipate improvement based on home readings.  Body aches - Plan: Novel Coronavirus, NAA ([facility]) Body aches after vaccination - Plan: Novel Coronavirus, NAA ([facility])  -COVID-19 testing performed but suspected body aches after vaccination. Anticipate improvement in symptoms in the next day or 2.  Essential hypertension, benign - Plan: Comprehensive metabolic panel, metoprolol tartrate (LOPRESSOR) 50 MG tablet Benign hypertension - Plan: losartan (COZAAR) 100 MG tablet  -Some decreased control, will adjust losartan to 100 mg daily, potential side effects/orthostatic precautions given  Pure hypercholesterolemia - Plan: Comprehensive metabolic panel, Lipid panel Hypercholesterolemia - Plan: pravastatin (PRAVACHOL) 80 MG tablet  -Check labs, continue pravastatin  Cloudy urine - Plan: POCT urinalysis dipstick, POCT Microscopic Urinalysis ([facility]), Urine Culture E. coli UTI - Plan: POCT urinalysis dipstick, POCT Microscopic Urinalysis ([facility]), Urine Culture UTI symptoms - Plan: sulfamethoxazole-trimethoprim (BACTRIM DS) 800-160 MG tablet Urinary tract infection with  hematuria, site unspecified - Plan: sulfamethoxazole-trimethoprim (BACTRIM DS) 800-160 MG tablet  -Urinalysis reviewed as above with patient on phone after visit. Possible recurrence of UTI, tolerated Septra previously. We will treat again with 1 week of Septra DS, repeat urinalysis/office visit after completion of antibiotic. RTC precautions  Depression with anxiety - Plan: DULoxetine (CYMBALTA) 60 MG capsule  -Stable, continue Cymbalta.  Hypothyroidism due to acquired atrophy of thyroid - Plan: levothyroxine (SYNTHROID) 88 MCG tablet  -Previous TSH stable, check labs next visit. Continue Synthroid   Meds ordered this encounter  Medications   DULoxetine (CYMBALTA) 60 MG capsule    Sig: Take 2 capsules (120 mg total) by mouth daily.    Dispense:  180 capsule    Refill:  1   levothyroxine (SYNTHROID) 88 MCG tablet    Sig: TAKE 1 TABLET (88 MCG TOTAL) BY MOUTH DAILY BEFORE BREAKFAST.    Dispense:  90 tablet    Refill:  1   metoprolol tartrate (LOPRESSOR) 50 MG tablet    Sig: Take 1 tablet (50 mg total) by mouth 2 (two) times daily.    Dispense:  180 tablet    Refill:  1   losartan (COZAAR) 100 MG tablet    Sig: Take 1 tablet (100 mg total) by mouth every evening.  Dispense:  90 tablet    Refill:  1   pravastatin (PRAVACHOL) 80 MG tablet    Sig: Take 1 tablet (80 mg total) by mouth every evening.    Dispense:  90 tablet    Refill:  1   sulfamethoxazole-trimethoprim (BACTRIM DS) 800-160 MG tablet    Sig: Take 1 tablet by mouth 2 (two) times daily.    Dispense:  14 tablet    Refill:  0   Patient Instructions   No change in diabetes meds for now.  Increase losartan to 100mg  per day. If new side effects be seen right away.  Stay hydrated, but I will check urine test today.  I will check covid test, but symptoms may be due to vaccine and should improve in next few days  If any need for albuterol, or flare of COPD, follow up to discuss other meds if needed.   Return to the  clinic or go to the nearest emergency room if any of your symptoms worsen or new symptoms occur.    If you have lab work done today you will be contacted with your lab results within the next 2 weeks.  If you have not heard from then please contact us. The fastest way to get your results is to register for My Chart.   IF you received an x-ray today, you will receive an invoice from [facility]. Please contact [facility] at [phone] with questions or concerns regarding your invoice.   IF you received labwork today, you will receive an invoice from [facility]. Please contact [facility] at [phone] with questions or concerns regarding your invoice.   Our billing staff will not be able to assist you with questions regarding bills from these companies.  You will be contacted with the lab results as soon as they are available. The fastest way to get your results is to activate your My Chart account. Instructions are located on the last page of this paperwork. If you have not heard from [phone] regarding the results in 2 weeks, please contact this office.         Signed, [place], MD [facility]

## 2020-12-06 NOTE — Patient Instructions (Addendum)
No change in diabetes meds for now.  Increase losartan to 100mg  per day. If new side effects be seen right away.  Stay hydrated, but I will check urine test today.  I will check covid test, but symptoms may be due to vaccine and should improve in next few days  If any need for albuterol, or flare of COPD, follow up to discuss other meds if needed.   Return to the clinic or go to the nearest emergency room if any of your symptoms worsen or new symptoms occur.    If you have lab work done today you will be contacted with your lab results within the next 2 weeks.  If you have not heard from then please contact us. The fastest way to get your results is to register for My Chart.   IF you received an x-ray today, you will receive an invoice from [facility]. Please contact [facility] at [phone] with questions or concerns regarding your invoice.   IF you received labwork today, you will receive an invoice from [facility]. Please contact [facility] at [phone] with questions or concerns regarding your invoice.   Our billing staff will not be able to assist you with questions regarding bills from these companies.  You will be contacted with the lab results as soon as they are available. The fastest way to get your results is to activate your My Chart account. Instructions are located on the last page of this paperwork. If you have not heard from [phone] regarding the results in 2 weeks, please contact this office.

## 2020-12-07 LAB — COMPREHENSIVE METABOLIC PANEL
ALT: 10 IU/L (ref 0–32)
AST: 18 IU/L (ref 0–40)
Albumin/Globulin Ratio: 1.1 — ABNORMAL LOW (ref 1.2–2.2)
Albumin: 3.7 g/dL — ABNORMAL LOW (ref 3.8–4.8)
Alkaline Phosphatase: 85 IU/L (ref 44–121)
BUN/Creatinine Ratio: 7 — ABNORMAL LOW (ref 12–28)
BUN: 6 mg/dL — ABNORMAL LOW (ref 8–27)
Bilirubin Total: 0.3 mg/dL (ref 0.0–1.2)
CO2: 26 mmol/L (ref 20–29)
Calcium: 9.2 mg/dL (ref 8.7–10.3)
Chloride: 101 mmol/L (ref 96–106)
Creatinine, Ser: 0.9 mg/dL (ref 0.57–1.00)
GFR calc Af Amer: 78 mL/min/{1.73_m2} (ref >59)
GFR calc non Af Amer: 67 mL/min/{1.73_m2} (ref >59)
Globulin, Total: 3.5 g/dL (ref 1.5–4.5)
Glucose: 164 mg/dL — ABNORMAL HIGH (ref 65–99)
Potassium: 4.2 mmol/L (ref 3.5–5.2)
Sodium: 140 mmol/L (ref 134–144)
Total Protein: 7.2 g/dL (ref 6.0–8.5)

## 2020-12-07 LAB — HEMOGLOBIN A1C
Est. average glucose Bld gHb Est-mCnc: 148 mg/dL
Hgb A1c MFr Bld: 6.8 % — ABNORMAL HIGH (ref 4.8–5.6)

## 2020-12-07 LAB — LIPID PANEL
Chol/HDL Ratio: 3.3 ratio (ref 0.0–4.4)
Cholesterol, Total: 153 mg/dL (ref 100–199)
HDL: 46 mg/dL (ref >39)
LDL Chol Calc (NIH): 80 mg/dL (ref 0–99)
Triglycerides: 158 mg/dL — ABNORMAL HIGH (ref 0–149)
VLDL Cholesterol Cal: 27 mg/dL (ref 5–40)

## 2020-12-09 ENCOUNTER — Telehealth: Payer: Self-pay | Admitting: Family Medicine

## 2020-12-09 LAB — NOVEL CORONAVIRUS, NAA: SARS-CoV-2, NAA: NOT DETECTED

## 2020-12-09 NOTE — Telephone Encounter (Signed)
Please advise on the lab results. Pt is requesting them.

## 2020-12-09 NOTE — Telephone Encounter (Signed)
Pt would like results of her lab work today.

## 2020-12-10 LAB — URINE CULTURE

## 2020-12-11 ENCOUNTER — Other Ambulatory Visit: Payer: Self-pay

## 2020-12-11 ENCOUNTER — Encounter: Payer: Self-pay | Admitting: Family Medicine

## 2020-12-11 ENCOUNTER — Telehealth (INDEPENDENT_AMBULATORY_CARE_PROVIDER_SITE_OTHER): Payer: Medicare Other | Admitting: Family Medicine

## 2020-12-11 VITALS — Ht 60.0 in | Wt 165.0 lb

## 2020-12-11 DIAGNOSIS — E1165 Type 2 diabetes mellitus with hyperglycemia: Secondary | ICD-10-CM | POA: Diagnosis not present

## 2020-12-11 NOTE — Progress Notes (Signed)
Virtual Visit via Telephone Note  I connected with Felicia Hanson on 12/11/20 at 5:52 PM by telephone and verified that I am speaking with the correct person using two identifiers. Patient location:home  My location: office   I discussed the limitations, risks, security and privacy concerns of performing an evaluation and management service by telephone and the availability of in person appointments. I also discussed with the patient that there may be a patient responsible charge related to this service. The patient expressed understanding and agreed to proceed, consent obtained  Chief complaint:  Chief Complaint  Patient presents with  . medication concerns    Pt reports she wants to discuss her current diabetic medication with provider. Pt reports that during last visit the provider switched the medication up, and states she had a lot of the last insulin left so she stayed on it to finish what she had. Pt reports she hasn't switched to the new insulin medication yet, but sees her A1C is down. Pt wants to know if she should switch as planned or continue with previous plan.    History of Present Illness:  Felicia Hanson is a 65 y.o. female  Diabetes: recent visit with improved readings on 12/07/19. Now she notes that she was still  taking 1.5mg  trulicity once per week (had not taken higher dose of trulicity at 3mg ) and was still taking 100mg , and 38units insulin per day.  Rare reading over 200. Usually 114-132. No symptomatic lows.   Lab Results  Component Value Date   HGBA1C 6.8 (H) 12/06/2020   HGBA1C 9.1 (A) 09/06/2020   HGBA1C 11.5 (A) 07/08/2020   Lab Results  Component Value Date   LDLCALC 80 12/06/2020   CREATININE 0.90 12/06/2020      Patient Active Problem List   Diagnosis Date Noted  . Type 2 diabetes mellitus without complication, without long-term current use of insulin (HCC) 10/26/2017  . Generalized anxiety disorder 01/10/2014  . Vitamin D deficiency  01/10/2014  . Essential hypertension, benign 01/10/2014  . Pure hypercholesterolemia 01/10/2014  . Hypothyroidism 01/10/2014   Past Medical History:  Diagnosis Date  . Anxiety   . Coronary artery calcification   . Depression   . Diabetes mellitus without complication (HCC)   . Hyperlipidemia   . Hypertension   . Thyroid disease    Past Surgical History:  Procedure Laterality Date  . BREAST EXCISIONAL BIOPSY Left   . CHOLECYSTECTOMY    . COLON SURGERY     diverticulitis with perforation; s/p colon resection.  . Colonoscopy    . TUBAL LIGATION     Allergies  Allergen Reactions  . Metformin And Related Anaphylaxis    Could not swallow  . Ace Inhibitors Cough    Cough with lisinopril.    Prior to Admission medications   Medication Sig Start Date End Date Taking? Authorizing Provider  ACCU-CHEK AVIVA PLUS test strip daily. for testing 03/11/18  Yes [provider]  ALPRAZolam 03/10/2014) 1 MG tablet Take 0.5 mg by mouth 3 (three) times daily as needed for anxiety or sleep.  08/21/13  Yes [provider]  aspirin 81 MG tablet Take 81 mg by mouth daily.   Yes [provider]  betamethasone dipropionate 0.05 % lotion Apply 1 application topically as needed.  04/10/20  Yes [provider]  cetirizine (ZYRTEC) 10 MG tablet Take 1 tablet (10 mg total) by mouth daily. 10/18/19  Yes 06/10/20, MD  Continuous Blood Gluc  Receiver (DEXCOM G6 RECEIVER) DEVI 1 Device by Does not apply route every morning. Device and supplies 04/18/20  Yes Shade Flood, MD  Dulaglutide (TRULICITY) 3 MG/0.5ML SOPN Inject 3 mg as directed once a week. 11/06/20  Yes Shade Flood, MD  DULoxetine (CYMBALTA) 60 MG capsule Take 2 capsules (120 mg total) by mouth daily. 12/06/20  Yes Shade Flood, MD  insulin glargine (LANTUS SOLOSTAR) 100 UNIT/ML Solostar Pen Inject 20 Units into the skin daily. 11/18/20  Yes Shade Flood, MD  Insulin Pen Needle 31G X 5 MM MISC  Inject 1 each into the skin daily. 09/06/20  Yes Janeece Agee, NP  levothyroxine (SYNTHROID) 88 MCG tablet TAKE 1 TABLET (88 MCG TOTAL) BY MOUTH DAILY BEFORE BREAKFAST. 12/06/20  Yes Shade Flood, MD  losartan (COZAAR) 100 MG tablet Take 1 tablet (100 mg total) by mouth every evening. 12/06/20 03/06/21 Yes Shade Flood, MD  metoprolol tartrate (LOPRESSOR) 50 MG tablet Take 1 tablet (50 mg total) by mouth 2 (two) times daily. 12/06/20  Yes Shade Flood, MD  nitroGLYCERIN (NITROSTAT) 0.4 MG SL tablet PLACE 1 TABLET UNDER THE TONGUE EVERY 5 MINUTES AS NEEDED FOR CHEST PAIN. IF YOU REQUIRE MORE THAN TWO TABLETS FIVE MINUTES APART GO TO THE NEAREST ER VIA EMS. 07/29/20  Yes Tolia, Sunit, DO  ondansetron (ZOFRAN ODT) 4 MG disintegrating tablet Take 1 tablet (4 mg total) by mouth every 8 (eight) hours as needed for nausea or vomiting. 07/14/19  Yes Stallings, Zoe A, MD  pravastatin (PRAVACHOL) 80 MG tablet Take 1 tablet (80 mg total) by mouth every evening. 12/06/20 03/06/21 Yes Shade Flood, MD  sulfamethoxazole-trimethoprim (BACTRIM DS) 800-160 MG tablet Take 1 tablet by mouth 2 (two) times daily. 12/06/20  Yes Shade Flood, MD  triamcinolone cream (KENALOG) 0.5 % Apply 1 application topically 3 (three) times daily. Apply to back and forearms, avoid the face 10/18/19  Yes Stallings, Zoe A, MD  Vitamin D, Ergocalciferol, (DRISDOL) 1.25 MG (50000 UNIT) CAPS capsule TAKE 1 CAPSULE (50,000 UNITS TOTAL) BY MOUTH EVERY 7 (SEVEN) DAYS. 10/04/20  Yes Shade Flood, MD   Social History   Socioeconomic History  . Marital status: Married    Spouse name: Not on file  . Number of children: 2  . Years of education: Not on file  . Highest education level: Not on file  Occupational History  . Not on file  Tobacco Use  . Smoking status: Current Every Day Smoker    Packs/day: 0.75    Years: 38.00    Pack years: 28.50    Types: Cigarettes  . Smokeless tobacco: Never Used  Vaping Use  . Vaping Use:  Former  Substance and Sexual Activity  . Alcohol use: No    Alcohol/week: 0.0 standard drinks  . Drug use: No  . Sexual activity: Yes  Other Topics Concern  . 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  Further information obtained that she had been on lower dose of Trulicity and had continued the DPP 4.  Stop Januvia.  Continue 1.5 mg Trulicity for now, same dose of insulin.  If she starts experiencing higher readings including more 200s, then increase Trulicity to 3 mg/week but monitor for hypoglycemia with those changes.  If hypoglycemic will need to temporarily decrease insulin.  Recheck 6 weeks.  Understanding expressed.  Follow Up Instructions:   6 weeks.  I discussed the assessment and treatment plan with the patient. The patient was provided an opportunity to ask questions and all were answered. The patient agreed with the plan and demonstrated an understanding of the instructions.   The patient was advised to call back or seek an in-person evaluation if the symptoms worsen or if the condition fails to improve as anticipated.  I provided 11 minutes of non-face-to-face time during this encounter.  Signed,   Meredith Staggers, MD [facility] at [facility].  12/11/20

## 2020-12-30 ENCOUNTER — Telehealth (INDEPENDENT_AMBULATORY_CARE_PROVIDER_SITE_OTHER): Payer: Medicare Other | Admitting: Family Medicine

## 2020-12-30 ENCOUNTER — Telehealth: Payer: Self-pay | Admitting: Family Medicine

## 2020-12-30 ENCOUNTER — Other Ambulatory Visit: Payer: Self-pay

## 2020-12-30 ENCOUNTER — Ambulatory Visit: Payer: Self-pay | Admitting: *Deleted

## 2020-12-30 VITALS — Temp 101.5°F

## 2020-12-30 DIAGNOSIS — Z20822 Contact with and (suspected) exposure to covid-19: Secondary | ICD-10-CM

## 2020-12-30 DIAGNOSIS — R059 Cough, unspecified: Secondary | ICD-10-CM

## 2020-12-30 DIAGNOSIS — Z87440 Personal history of urinary (tract) infections: Secondary | ICD-10-CM | POA: Diagnosis not present

## 2020-12-30 DIAGNOSIS — R509 Fever, unspecified: Secondary | ICD-10-CM | POA: Diagnosis not present

## 2020-12-30 DIAGNOSIS — E1165 Type 2 diabetes mellitus with hyperglycemia: Secondary | ICD-10-CM

## 2020-12-30 MED ORDER — DOXYCYCLINE HYCLATE 100 MG PO TABS
100.0000 mg | ORAL_TABLET | Freq: Two times a day (BID) | ORAL | 0 refills | Status: DC
Start: 2020-12-30 — End: 2021-01-20

## 2020-12-30 NOTE — Telephone Encounter (Signed)
Pt reports taking 38 units lantus and not 20 as prescribed please advise if this is okay and then adjustments to medication can be sent in

## 2020-12-30 NOTE — Telephone Encounter (Signed)
Noted  

## 2020-12-30 NOTE — Telephone Encounter (Signed)
Pt is calling and she is taking 38 units of lantus solostar a day  not 20 units. [facility] [address] . Pt seen dr Neva Seat on 12-11-2020

## 2020-12-30 NOTE — Telephone Encounter (Signed)
Patient is calling with concerns that she may have had COVID exposure and she has increased wheezing in the morning with low grade temperature and body aches.. Patient states she is not have trouble breathing at this time.  Reason for Disposition . [1] Longstanding difficulty breathing (e.g., CHF, COPD, emphysema) AND [2] WORSE than normal  Answer Assessment - Initial Assessment Questions 1. RESPIRATORY STATUS: "Describe your breathing?" (e.g., wheezing, shortness of breath, unable to speak, severe coughing)      Wheezing with low grade temp, COVID exposure 2. ONSET: "When did this breathing problem begin?"      2-3 days 3. PATTERN "Does the difficult breathing come and go, or has it been constant since it started?"      Patient has COPD and is concerned that she has COVID- no difficultly with breathing 4. SEVERITY: "How bad is your breathing?" (e.g., mild, moderate, severe)    - MILD: No SOB at rest, mild SOB with walking, speaks normally in sentences, can lay down, no retractions, pulse < 100.    - MODERATE: SOB at rest, SOB with minimal exertion and prefers to sit, cannot lie down flat, speaks in phrases, mild retractions, audible wheezing, pulse 100-120.    - SEVERE: Very SOB at rest, speaks in single words, struggling to breathe, sitting hunched forward, retractions, pulse > 120      No SOB- wheezing 5. RECURRENT SYMPTOM: "Have you had difficulty breathing before?" If Yes, ask: "When was the last time?" and "What happened that time?"      Yes- COPD hx 6. CARDIAC HISTORY: "Do you have any history of heart disease?" (e.g., heart attack, angina, bypass surgery, angioplasty)      Yes- has blockage 7. LUNG HISTORY: "Do you have any history of lung disease?"  (e.g., pulmonary embolus, asthma, emphysema)     COPD 8. CAUSE: "What do you think is causing the breathing problem?"      Possible COVID 9. OTHER SYMPTOMS: "Do you have any other symptoms? (e.g., dizziness, runny nose, cough, chest  pain, fever)     Fever- low grade, body aches 10. PREGNANCY: "Is there any chance you are pregnant?" "When was your last menstrual period?"       n/a 11. TRAVEL: "Have you traveled out of the country in the last month?" (e.g., travel history, exposures)       COVID exposure  Protocols used: BREATHING DIFFICULTY-A-AH

## 2020-12-30 NOTE — Progress Notes (Signed)
Virtual Visit via Telephone Note  I connected with Felicia Hanson on 12/30/20 at 5:35 PM,  by telephone and verified that I am speaking with the correct person using two identifiers. Patient location: in her car.  My location: office.    I discussed the limitations, risks, security and privacy concerns of performing an evaluation and management service by telephone and the availability of in person appointments. I also discussed with the patient that there may be a patient responsible charge related to this service. The patient expressed understanding and agreed to proceed, consent obtained  Chief complaint:  Chief Complaint  Patient presents with  . Covid Exposure    Pt has been Congested, drainage, fever, denies headache. Pt reports urinary sxs are gone. 3 positive COVID around her, she has tested negative 2-3 times past 2 weeks, reports fever and other sxs started about 4 or 5 days ago but has been generally under the weather for several weeks.  Pt also reports Zpak usually works well given her COPD and would like this to be considered if anything is called in     History of Present Illness: Felicia Hanson is a 65 y.o. female  Cough, congestion, fever Slight intermittent cough past week. bodyaches past few weeks as well. No headaches. No chest pain.  Fever last 2 weeks - low grade - 99.9. Highest temp 100.5 last few nights. Slight congestion, minimal  runny nose. Wheezing past few days when wakes up, then improves with clearing throat and mucinex -resolves in an hour.  Not requiring inhaler. No new dyspnea.   Multiple sick contacts with covid infections- 22yo granddaughter positive 2 days ago. 2 sons and 2 granddaughters 2 weeks ago. Has been helping granddaughter with infant recently - wearing well fitting mask.    Most recent testing on January 7 was negative for COVID-19. Hx of COPD.  Had improved after UTI after treatment earlier this month (completed abx course)  No dysuria,  frequency, urgency, hematuria or abd pain. Single cramping last night resolved with BM.  No n/v/d. No blood in BM. No new back pain/flank pain.    Patient Active Problem List   Diagnosis Date Noted  . Type 2 diabetes mellitus without complication, without long-term current use of insulin (HCC) 10/26/2017  . Generalized anxiety disorder 01/10/2014  . Vitamin D deficiency 01/10/2014  . Essential hypertension, benign 01/10/2014  . Pure hypercholesterolemia 01/10/2014  . Hypothyroidism 01/10/2014   Past Medical History:  Diagnosis Date  . Anxiety   . Coronary artery calcification   . Depression   . Diabetes mellitus without complication (HCC)   . Hyperlipidemia   . Hypertension   . Thyroid disease    Past Surgical History:  Procedure Laterality Date  . BREAST EXCISIONAL BIOPSY Left   . CHOLECYSTECTOMY    . COLON SURGERY     diverticulitis with perforation; s/p colon resection.  . Colonoscopy    . TUBAL LIGATION     Allergies  Allergen Reactions  . Metformin And Related Anaphylaxis    Could not swallow  . Ace Inhibitors Cough    Cough with lisinopril.    Prior to Admission medications   Medication Sig Start Date End Date Taking? Authorizing Provider  ACCU-CHEK AVIVA PLUS test strip daily. for testing 03/11/18  Yes [provider]  ALPRAZolam Prudy Feeler) 1 MG tablet Take 0.5 mg by mouth 3 (three) times daily as needed for anxiety or sleep.  08/21/13  Yes [provider]  aspirin  81 MG tablet Take 81 mg by mouth daily.   Yes [provider]  betamethasone dipropionate 0.05 % lotion Apply 1 application topically as needed.  04/10/20  Yes [provider]  cetirizine (ZYRTEC) 10 MG tablet Take 1 tablet (10 mg total) by mouth daily. 10/18/19  Yes Doristine Bosworth, MD  Continuous Blood Gluc Receiver (DEXCOM G6 RECEIVER) DEVI 1 Device by Does not apply route every morning. Device and supplies 04/18/20  Yes Shade Flood, MD  Dulaglutide (TRULICITY) 3  MG/0.5ML SOPN Inject 3 mg as directed once a week. 11/06/20  Yes Shade Flood, MD  DULoxetine (CYMBALTA) 60 MG capsule Take 2 capsules (120 mg total) by mouth daily. 12/06/20  Yes Shade Flood, MD  insulin glargine (LANTUS SOLOSTAR) 100 UNIT/ML Solostar Pen Inject 20 Units into the skin daily. 11/18/20  Yes Shade Flood, MD  Insulin Pen Needle 31G X 5 MM MISC Inject 1 each into the skin daily. 09/06/20  Yes Janeece Agee, NP  levothyroxine (SYNTHROID) 88 MCG tablet TAKE 1 TABLET (88 MCG TOTAL) BY MOUTH DAILY BEFORE BREAKFAST. 12/06/20  Yes Shade Flood, MD  losartan (COZAAR) 100 MG tablet Take 1 tablet (100 mg total) by mouth every evening. 12/06/20 03/06/21 Yes Shade Flood, MD  metoprolol tartrate (LOPRESSOR) 50 MG tablet Take 1 tablet (50 mg total) by mouth 2 (two) times daily. 12/06/20  Yes Shade Flood, MD  nitroGLYCERIN (NITROSTAT) 0.4 MG SL tablet PLACE 1 TABLET UNDER THE TONGUE EVERY 5 MINUTES AS NEEDED FOR CHEST PAIN. IF YOU REQUIRE MORE THAN TWO TABLETS FIVE MINUTES APART GO TO THE NEAREST ER VIA EMS. 07/29/20  Yes Tolia, Sunit, DO  ondansetron (ZOFRAN ODT) 4 MG disintegrating tablet Take 1 tablet (4 mg total) by mouth every 8 (eight) hours as needed for nausea or vomiting. 07/14/19  Yes Stallings, Zoe A, MD  pravastatin (PRAVACHOL) 80 MG tablet Take 1 tablet (80 mg total) by mouth every evening. 12/06/20 03/06/21 Yes Shade Flood, MD  sulfamethoxazole-trimethoprim (BACTRIM DS) 800-160 MG tablet Take 1 tablet by mouth 2 (two) times daily. 12/06/20  Yes Shade Flood, MD  triamcinolone cream (KENALOG) 0.5 % Apply 1 application topically 3 (three) times daily. Apply to back and forearms, avoid the face 10/18/19  Yes Stallings, Zoe A, MD  Vitamin D, Ergocalciferol, (DRISDOL) 1.25 MG (50000 UNIT) CAPS capsule TAKE 1 CAPSULE (50,000 UNITS TOTAL) BY MOUTH EVERY 7 (SEVEN) DAYS. 10/04/20  Yes Shade Flood, MD   Social History   Socioeconomic History  . Marital status:  Married    Spouse name: Not on file  . Number of children: 2  . Years of education: Not on file  . Highest education level: Not on file  Occupational History  . Not on file  Tobacco Use  . Smoking status: Current Every Day Smoker    Packs/day: 0.75    Years: 38.00    Pack years: 28.50    Types: Cigarettes  . Smokeless tobacco: Never Used  Vaping Use  . Vaping Use: Former  Substance and Sexual Activity  . Alcohol use: No    Alcohol/week: 0.0 standard drinks  . Drug use: No  . Sexual activity: Yes  Other Topics Concern  . 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  No respiratory distress.  All questions were answered with understanding of plan expressed.  Assessment and Plan: Cough - Plan: doxycycline (VIBRA-TABS) 100 MG tablet  Exposure to COVID-19 virus - Plan: COVID-19, Flu A+B and RSV  Recent urinary tract infection - Plan: doxycycline (VIBRA-TABS) 100 MG tablet  Fever, unspecified - Plan: doxycycline (VIBRA-TABS) 100 MG tablet  Diffuse body aches past few weeks with low-grade temps, recent slight elevation in fever.  Minimal cough during that time but history of underlying COPD, some increased wheeze past few days in the morning only.  Has not required albuterol inhaler which she does have at home.  Multiple sick contacts with COVID-19.   Additionally just recovered from Klebsiella urinary tract infection treated January 7.  Denies new urinary symptoms.  -COVID-19, flu, RSV testing performed today.  -Continue symptomatic care with Mucinex DM.  Fluids, Tylenol if needed.  -Isolation precautions discussed with masking until results known.   -Start doxycycline to cover for possible lower respiratory tract infection/COPD flare and with recent UTI, Klebsiella sensitivity noted for doxycycline.  Possible persistent UTI with minimal symptoms.  -ER/urgent care precautions if worsening, repeat virtual visit if not improving next 3 to 4 days.  Follow Up Instructions:    I discussed the assessment and treatment plan with the patient. The patient was provided an opportunity to ask questions and all were answered. The patient agreed with the plan and demonstrated an understanding of the instructions.   The patient was advised to call back or seek an in-person evaluation if the symptoms worsen or if the condition fails to improve as anticipated.  I provided 18 minutes of non-face-to-face time during this encounter.  Signed,   Meredith Staggers, MD Primary Care at [facility].  12/30/20

## 2020-12-31 ENCOUNTER — Other Ambulatory Visit: Payer: Self-pay

## 2020-12-31 ENCOUNTER — Telehealth: Payer: Self-pay | Admitting: Family Medicine

## 2020-12-31 DIAGNOSIS — E1165 Type 2 diabetes mellitus with hyperglycemia: Secondary | ICD-10-CM

## 2020-12-31 MED ORDER — LANTUS SOLOSTAR 100 UNIT/ML ~~LOC~~ SOPN: 38.0000 [IU] | PEN_INJECTOR | Freq: Every day | SUBCUTANEOUS | 1 refills | Status: DC

## 2020-12-31 MED ORDER — LANTUS SOLOSTAR 100 UNIT/ML ~~LOC~~ SOPN: 38.0000 [IU] | PEN_INJECTOR | Freq: Every day | SUBCUTANEOUS | 3 refills | Status: DC

## 2020-12-31 NOTE — Telephone Encounter (Signed)
Correct.  Dose as of most recent visit was 38 units.  New refill sent in.

## 2020-12-31 NOTE — Telephone Encounter (Signed)
Called spoke to Felicia Hanson, pt tearful from stress due to husband being upset. Pt and I talked and I helped her calm down. Pt got refill and was grateful. Asked about lab results

## 2020-12-31 NOTE — Telephone Encounter (Signed)
Pt asked if a 90 day supply can be sent for now on to help with cost for pts insulin glargine (LANTUS SOLOSTAR) 100 UNIT/ML Solostar Pen  Pt stated if this can be done asap or if not can Dr. Neva Seat explain to Pts husband/ Pt stated it upsets her and causes health issues or stress when he is upset over saving money/ please advise asap/ pt asked to speak with pcp or nurse to explain how this small thing effects her

## 2021-01-01 LAB — COVID-19, FLU A+B AND RSV
Influenza A, NAA: NOT DETECTED
Influenza B, NAA: NOT DETECTED
RSV, NAA: NOT DETECTED
SARS-CoV-2, NAA: NOT DETECTED

## 2021-01-12 ENCOUNTER — Other Ambulatory Visit: Payer: Self-pay | Admitting: Family Medicine

## 2021-01-12 DIAGNOSIS — R7989 Other specified abnormal findings of blood chemistry: Secondary | ICD-10-CM

## 2021-01-12 NOTE — Telephone Encounter (Signed)
Refused due to recent dosage change.  Requested Prescriptions  Pending Prescriptions Disp Refills  . losartan (COZAAR) 25 MG tablet [Pharmacy Med Name: LOSARTAN POTASSIUM 25 MG TAB] 30 tablet 1    Sig: TAKE 1 TABLET BY MOUTH EVERY DAY     Cardiovascular:  Angiotensin Receptor Blockers Failed - 01/12/2021 12:21 PM      Failed - Last BP in normal range    BP Readings from Last 1 Encounters:  12/06/20 (!) 143/80         Passed - Cr in normal range and within 180 days    Creat  Date Value Ref Range Status  08/09/2015 0.77 0.50 - 0.99 mg/dL Final   Creatinine, Ser  Date Value Ref Range Status  12/06/2020 0.90 0.57 - 1.00 mg/dL Final         Passed - K in normal range and within 180 days    Potassium  Date Value Ref Range Status  12/06/2020 4.2 3.5 - 5.2 mmol/L Final         Passed - Patient is not pregnant      Passed - Valid encounter within last 6 months    Recent Outpatient Visits          1 week ago Cough   [facility] at [place], Asencion Partridge, MD   1 month ago Uncontrolled type 2 diabetes mellitus with hyperglycemia [facility])   [facility] at [place], Asencion Partridge, MD   1 month ago Uncontrolled type 2 diabetes mellitus with hyperglycemia [facility])   [facility] at [place], Asencion Partridge, MD   2 months ago Fever, unspecified   [facility] [place], Asencion Partridge, MD   2 months ago Urinary tract infection with hematuria, site unspecified   [facility] [place], Asencion Partridge, MD      Future Appointments            In 1 week Neva Seat Asencion Partridge, MD Primary Care at [place], [facility]   In 1 month Shade Flood, MD Primary Care at [place], [facility]           Signed Prescriptions Disp Refills   Vitamin D, Ergocalciferol, (DRISDOL) 1.25 MG (50000 UNIT) CAPS capsule 15 capsule 0    Sig: TAKE 1 CAPSULE (50,000 UNITS TOTAL) BY MOUTH EVERY 7 (SEVEN) DAYS.     Endocrinology:  Vitamins - Vitamin D Supplementation Failed - 01/12/2021 12:21 PM      Failed - 50,000 IU  strengths are not delegated      Failed - Phosphate in normal range and within 360 days    No results found for: PHOS       Passed - Ca in normal range and within 360 days    Calcium  Date Value Ref Range Status  12/06/2020 9.2 8.7 - 10.3 mg/dL Final         Passed - Vitamin D in normal range and within 360 days    Vit D, 25-Hydroxy  Date Value Ref Range Status  09/06/2020 44.3 30.0 - 100.0 ng/mL Final    Comment:    Vitamin D deficiency has been defined by the Institute of Medicine and an Endocrine Society practice guideline as a level of serum 25-OH vitamin D less than 20 ng/mL (1,2). The Endocrine Society went on to further define vitamin D insufficiency as a level between 21 and 29 ng/mL (2). 1. IOM (Institute of Medicine). 2010. Dietary reference    intakes for calcium and  D. [place]: The    [employer]. 2. Holick MF, Binkley B[place]mer, Bischoff-Ferrari HA, et al.    Evaluation, treatment, and prevention of vitamin D    deficiency: an Endocrine Society clinical practice    guideline. JCEM. 2011 Jul; 96(7):1911-30.          Passed - Valid encounter within last 12 months    Recent Outpatient Visits          1 week ago Cough   [facility] [place], Asencion Partridge, MD   1 month ago Uncontrolled type 2 diabetes mellitus with hyperglycemia [facility])   [facility] at [place], Asencion Partridge, MD   1 month ago Uncontrolled type 2 diabetes mellitus with hyperglycemia [facility])   [facility] at [place], Asencion Partridge, MD   2 months ago Fever, unspecified   [facility] [place], Asencion Partridge, MD   2 months ago Urinary tract infection with hematuria, site unspecified   [facility] at [place], Asencion Partridge, MD      Future Appointments            In 1 week Neva Seat Asencion Partridge, MD Primary Care at [place], [facility]   In 1 month Neva Seat Asencion Partridge, MD Primary Care at [place], [facility]

## 2021-01-12 NOTE — Telephone Encounter (Signed)
Vitamin D approved per protocol. Cozaar refused due to dosage recent dosage change.  Requested Prescriptions  Pending Prescriptions Disp Refills  . Vitamin D, Ergocalciferol, (DRISDOL) 1.25 MG (50000 UNIT) CAPS capsule [Pharmacy Med Name: VITAMIN D2 1.25MG (50,000 UNIT)] 15 capsule 0    Sig: TAKE 1 CAPSULE (50,000 UNITS TOTAL) BY MOUTH EVERY 7 (SEVEN) DAYS.     Endocrinology:  Vitamins - Vitamin D Supplementation Failed - 01/12/2021 12:21 PM      Failed - 50,000 IU strengths are not delegated      Failed - Phosphate in normal range and within 360 days    No results found for: PHOS       Passed - Ca in normal range and within 360 days    Calcium  Date Value Ref Range Status  12/06/2020 9.2 8.7 - 10.3 mg/dL Final         Passed - Vitamin D in normal range and within 360 days    Vit D, 25-Hydroxy  Date Value Ref Range Status  09/06/2020 44.3 30.0 - 100.0 ng/mL Final    Comment:    Vitamin D deficiency has been defined by the Institute of Medicine and an Endocrine Society practice guideline as a level of serum 25-OH vitamin D less than 20 ng/mL (1,2). The Endocrine Society went on to further define vitamin D insufficiency as a level between 21 and 29 ng/mL (2). 1. IOM (Institute of Medicine). 2010. Dietary reference    intakes for calcium and D. [place]: The    [employer]. 2. Holick MF, Binkley [place], Bischoff-Ferrari HA, et al.    Evaluation, treatment, and prevention of vitamin D    deficiency: an Endocrine Society clinical practice    guideline. JCEM. 2011 Jul; 96(7):1911-30.          Passed - Valid encounter within last 12 months    Recent Outpatient Visits          1 week ago Cough   [facility] [place], Asencion Partridge, MD   1 month ago Uncontrolled type 2 diabetes mellitus with hyperglycemia [facility])   [facility] at [place], Asencion Partridge, MD   1 month ago Uncontrolled type 2 diabetes mellitus with hyperglycemia [facility])   [facility] at [place], Asencion Partridge, MD   2 months ago Fever, unspecified   [facility] [place], Asencion Partridge, MD   2 months ago Urinary tract infection with hematuria, site unspecified   [facility] [place], Asencion Partridge, MD      Future Appointments            In 1 week Neva Seat Asencion Partridge, MD Primary Care at [place], [facility]   In 1 month Shade Flood, MD Primary Care at [place], [facility]           . losartan (COZAAR) 25 MG tablet [Pharmacy Med Name: LOSARTAN POTASSIUM 25 MG TAB] 30 tablet 1    Sig: TAKE 1 TABLET BY MOUTH EVERY DAY     Cardiovascular:  Angiotensin Receptor Blockers Failed - 01/12/2021 12:21 PM      Failed - Last BP in normal range    BP Readings from Last 1 Encounters:  12/06/20 (!) 143/80         Passed - Cr in normal range and within 180 days    Creat  Date Value Ref Range Status  08/09/2015 0.77 0.50 - 0.99 mg/dL Final   Creatinine, Ser  Date Value Ref Range  Status  12/06/2020 0.90 0.57 - 1.00 mg/dL Final         Passed - K in normal range and within 180 days    Potassium  Date Value Ref Range Status  12/06/2020 4.2 3.5 - 5.2 mmol/L Final         Passed - Patient is not pregnant      Passed - Valid encounter within last 6 months    Recent Outpatient Visits          1 week ago Cough   [facility] [place], Asencion Partridge, MD   1 month ago Uncontrolled type 2 diabetes mellitus with hyperglycemia [facility])   [facility] [place], Asencion Partridge, MD   1 month ago Uncontrolled type 2 diabetes mellitus with hyperglycemia [facility])   [facility] at [place], Asencion Partridge, MD   2 months ago Fever, unspecified   [facility] [place], Asencion Partridge, MD   2 months ago Urinary tract infection with hematuria, site unspecified   [facility] [place], Asencion Partridge, MD      Future Appointments            In 1 week Neva Seat Asencion Partridge, MD Primary Care at [place], [facility]   In 1 month Neva Seat Asencion Partridge, MD Primary Care at [place], [facility]

## 2021-01-18 ENCOUNTER — Other Ambulatory Visit: Payer: Self-pay | Admitting: Family Medicine

## 2021-01-18 ENCOUNTER — Telehealth: Payer: Self-pay | Admitting: Family Medicine

## 2021-01-18 DIAGNOSIS — I10 Essential (primary) hypertension: Secondary | ICD-10-CM

## 2021-01-18 MED ORDER — LOSARTAN POTASSIUM 50 MG PO TABS: 50.0000 mg | ORAL_TABLET | Freq: Every day | ORAL | 1 refills | Status: DC

## 2021-01-18 NOTE — Telephone Encounter (Signed)
Answering service call - problem with losartan. ? Dose  And refill issue. Had increased to 100mg  but she has been on 50mg  dose. Home readings 130-140/80-90. continue 50mg  dose for now and keep follow up in few weeks.   New rx sent to pharmacy.

## 2021-01-18 NOTE — Telephone Encounter (Signed)
See other message

## 2021-01-19 ENCOUNTER — Other Ambulatory Visit: Payer: Self-pay | Admitting: Family Medicine

## 2021-01-19 DIAGNOSIS — E034 Atrophy of thyroid (acquired): Secondary | ICD-10-CM

## 2021-01-20 ENCOUNTER — Inpatient Hospital Stay (HOSPITAL_COMMUNITY): Payer: Medicare Other

## 2021-01-20 ENCOUNTER — Inpatient Hospital Stay (HOSPITAL_COMMUNITY)
Admission: EM | Admit: 2021-01-20 | Discharge: 2021-01-24 | DRG: 286 | Disposition: A | Discharge status: Home / Self Care | Payer: Medicare Other | Attending: Internal Medicine | Admitting: Internal Medicine

## 2021-01-20 ENCOUNTER — Other Ambulatory Visit: Payer: Self-pay

## 2021-01-20 ENCOUNTER — Emergency Department (HOSPITAL_COMMUNITY): Payer: Medicare Other

## 2021-01-20 DIAGNOSIS — E039 Hypothyroidism, unspecified: Secondary | ICD-10-CM | POA: Diagnosis present

## 2021-01-20 DIAGNOSIS — I130 Hypertensive heart and chronic kidney disease with heart failure and stage 1 through stage 4 chronic kidney disease, or unspecified chronic kidney disease: Principal | ICD-10-CM | POA: Diagnosis present

## 2021-01-20 DIAGNOSIS — F32A Depression, unspecified: Secondary | ICD-10-CM | POA: Diagnosis present

## 2021-01-20 DIAGNOSIS — E876 Hypokalemia: Secondary | ICD-10-CM | POA: Diagnosis not present

## 2021-01-20 DIAGNOSIS — Z87892 Personal history of anaphylaxis: Secondary | ICD-10-CM

## 2021-01-20 DIAGNOSIS — I5042 Chronic combined systolic (congestive) and diastolic (congestive) heart failure: Secondary | ICD-10-CM | POA: Diagnosis present

## 2021-01-20 DIAGNOSIS — I447 Left bundle-branch block, unspecified: Secondary | ICD-10-CM | POA: Diagnosis present

## 2021-01-20 DIAGNOSIS — E1165 Type 2 diabetes mellitus with hyperglycemia: Secondary | ICD-10-CM | POA: Diagnosis present

## 2021-01-20 DIAGNOSIS — Z888 Allergy status to other drugs, medicaments and biological substances status: Secondary | ICD-10-CM

## 2021-01-20 DIAGNOSIS — I2510 Atherosclerotic heart disease of native coronary artery without angina pectoris: Secondary | ICD-10-CM | POA: Diagnosis present

## 2021-01-20 DIAGNOSIS — F411 Generalized anxiety disorder: Secondary | ICD-10-CM | POA: Diagnosis present

## 2021-01-20 DIAGNOSIS — Z66 Do not resuscitate: Secondary | ICD-10-CM | POA: Diagnosis present

## 2021-01-20 DIAGNOSIS — J9601 Acute respiratory failure with hypoxia: Secondary | ICD-10-CM | POA: Diagnosis present

## 2021-01-20 DIAGNOSIS — I509 Heart failure, unspecified: Secondary | ICD-10-CM

## 2021-01-20 DIAGNOSIS — I10 Essential (primary) hypertension: Secondary | ICD-10-CM | POA: Diagnosis present

## 2021-01-20 DIAGNOSIS — E781 Pure hyperglyceridemia: Secondary | ICD-10-CM | POA: Diagnosis present

## 2021-01-20 DIAGNOSIS — Z20822 Contact with and (suspected) exposure to covid-19: Secondary | ICD-10-CM | POA: Diagnosis present

## 2021-01-20 DIAGNOSIS — I493 Ventricular premature depolarization: Secondary | ICD-10-CM | POA: Diagnosis not present

## 2021-01-20 DIAGNOSIS — I248 Other forms of acute ischemic heart disease: Secondary | ICD-10-CM | POA: Diagnosis present

## 2021-01-20 DIAGNOSIS — Z79890 Hormone replacement therapy (postmenopausal): Secondary | ICD-10-CM

## 2021-01-20 DIAGNOSIS — E119 Type 2 diabetes mellitus without complications: Secondary | ICD-10-CM

## 2021-01-20 DIAGNOSIS — E1169 Type 2 diabetes mellitus with other specified complication: Secondary | ICD-10-CM | POA: Diagnosis present

## 2021-01-20 DIAGNOSIS — Z79899 Other long term (current) drug therapy: Secondary | ICD-10-CM

## 2021-01-20 DIAGNOSIS — R0789 Other chest pain: Secondary | ICD-10-CM | POA: Diagnosis present

## 2021-01-20 DIAGNOSIS — N179 Acute kidney failure, unspecified: Secondary | ICD-10-CM | POA: Diagnosis not present

## 2021-01-20 DIAGNOSIS — N182 Chronic kidney disease, stage 2 (mild): Secondary | ICD-10-CM | POA: Diagnosis present

## 2021-01-20 DIAGNOSIS — I272 Pulmonary hypertension, unspecified: Secondary | ICD-10-CM | POA: Diagnosis present

## 2021-01-20 DIAGNOSIS — Z794 Long term (current) use of insulin: Secondary | ICD-10-CM

## 2021-01-20 DIAGNOSIS — E7800 Pure hypercholesterolemia, unspecified: Secondary | ICD-10-CM | POA: Diagnosis present

## 2021-01-20 DIAGNOSIS — J9600 Acute respiratory failure, unspecified whether with hypoxia or hypercapnia: Secondary | ICD-10-CM | POA: Diagnosis not present

## 2021-01-20 DIAGNOSIS — E785 Hyperlipidemia, unspecified: Secondary | ICD-10-CM | POA: Diagnosis present

## 2021-01-20 DIAGNOSIS — I5043 Acute on chronic combined systolic (congestive) and diastolic (congestive) heart failure: Secondary | ICD-10-CM | POA: Diagnosis present

## 2021-01-20 DIAGNOSIS — I5031 Acute diastolic (congestive) heart failure: Secondary | ICD-10-CM | POA: Diagnosis not present

## 2021-01-20 DIAGNOSIS — E1122 Type 2 diabetes mellitus with diabetic chronic kidney disease: Secondary | ICD-10-CM | POA: Diagnosis present

## 2021-01-20 DIAGNOSIS — J449 Chronic obstructive pulmonary disease, unspecified: Secondary | ICD-10-CM | POA: Diagnosis present

## 2021-01-20 DIAGNOSIS — Z7982 Long term (current) use of aspirin: Secondary | ICD-10-CM

## 2021-01-20 DIAGNOSIS — E118 Type 2 diabetes mellitus with unspecified complications: Secondary | ICD-10-CM

## 2021-01-20 DIAGNOSIS — F17210 Nicotine dependence, cigarettes, uncomplicated: Secondary | ICD-10-CM | POA: Diagnosis present

## 2021-01-20 DIAGNOSIS — R0603 Acute respiratory distress: Secondary | ICD-10-CM | POA: Diagnosis present

## 2021-01-20 DIAGNOSIS — I428 Other cardiomyopathies: Secondary | ICD-10-CM | POA: Diagnosis present

## 2021-01-20 DIAGNOSIS — Z83438 Family history of other disorder of lipoprotein metabolism and other lipidemia: Secondary | ICD-10-CM

## 2021-01-20 HISTORY — DX: Heart failure, unspecified: I50.9

## 2021-01-20 LAB — COMPREHENSIVE METABOLIC PANEL
ALT: 18 U/L (ref 0–44)
AST: 37 U/L (ref 15–41)
Albumin: 3.2 g/dL — ABNORMAL LOW (ref 3.5–5.0)
Alkaline Phosphatase: 68 U/L (ref 38–126)
Anion gap: 20 — ABNORMAL HIGH (ref 5–15)
BUN: 8 mg/dL (ref 8–23)
CO2: 17 mmol/L — ABNORMAL LOW (ref 22–32)
Calcium: 8.6 mg/dL — ABNORMAL LOW (ref 8.9–10.3)
Chloride: 98 mmol/L (ref 98–111)
Creatinine, Ser: 1.29 mg/dL — ABNORMAL HIGH (ref 0.44–1.00)
GFR, Estimated: 46 mL/min — ABNORMAL LOW (ref >60)
Glucose, Bld: 236 mg/dL — ABNORMAL HIGH (ref 70–99)
Potassium: 3.5 mmol/L (ref 3.5–5.1)
Sodium: 135 mmol/L (ref 135–145)
Total Bilirubin: 1 mg/dL (ref 0.3–1.2)
Total Protein: 7.3 g/dL (ref 6.5–8.1)

## 2021-01-20 LAB — RESP PANEL BY RT-PCR (FLU A&B, COVID) ARPGX2
Influenza A by PCR: NEGATIVE
Influenza B by PCR: NEGATIVE
SARS Coronavirus 2 by RT PCR: NEGATIVE

## 2021-01-20 LAB — CBC WITH DIFFERENTIAL/PLATELET
Abs Immature Granulocytes: 0 10*3/uL (ref 0.00–0.07)
Basophils Absolute: 0.2 10*3/uL — ABNORMAL HIGH (ref 0.0–0.1)
Basophils Relative: 1 %
Eosinophils Absolute: 0.5 10*3/uL (ref 0.0–0.5)
Eosinophils Relative: 3 %
HCT: 49.4 % — ABNORMAL HIGH (ref 36.0–46.0)
Hemoglobin: 15.7 g/dL — ABNORMAL HIGH (ref 12.0–15.0)
Lymphocytes Relative: 40 %
Lymphs Abs: 7.1 10*3/uL — ABNORMAL HIGH (ref 0.7–4.0)
MCH: 29.4 pg (ref 26.0–34.0)
MCHC: 31.8 g/dL (ref 30.0–36.0)
MCV: 92.5 fL (ref 80.0–100.0)
Monocytes Absolute: 0.7 10*3/uL (ref 0.1–1.0)
Monocytes Relative: 4 %
Neutro Abs: 9.3 10*3/uL — ABNORMAL HIGH (ref 1.7–7.7)
Neutrophils Relative %: 52 %
Platelets: 234 10*3/uL (ref 150–400)
RBC: 5.34 MIL/uL — ABNORMAL HIGH (ref 3.87–5.11)
RDW: 12.9 % (ref 11.5–15.5)
WBC: 17.8 10*3/uL — ABNORMAL HIGH (ref 4.0–10.5)
nRBC: 0 % (ref 0.0–0.2)
nRBC: 0 /100 WBC

## 2021-01-20 LAB — I-STAT ARTERIAL BLOOD GAS, ED
Acid-base deficit: 1 mmol/L (ref 0.0–2.0)
Bicarbonate: 24.7 mmol/L (ref 20.0–28.0)
Calcium, Ion: 1.14 mmol/L — ABNORMAL LOW (ref 1.15–1.40)
HCT: 41 % (ref 36.0–46.0)
Hemoglobin: 13.9 g/dL (ref 12.0–15.0)
O2 Saturation: 96 %
Patient temperature: 98.9
Potassium: 3.2 mmol/L — ABNORMAL LOW (ref 3.5–5.1)
Sodium: 137 mmol/L (ref 135–145)
TCO2: 26 mmol/L (ref 22–32)
pCO2 arterial: 45.5 mmHg (ref 32.0–48.0)
pH, Arterial: 7.342 — ABNORMAL LOW (ref 7.350–7.450)
pO2, Arterial: 87 mmHg (ref 83.0–108.0)

## 2021-01-20 LAB — TROPONIN I (HIGH SENSITIVITY)
Troponin I (High Sensitivity): 28 ng/L — ABNORMAL HIGH (ref <18)
Troponin I (High Sensitivity): 66 ng/L — ABNORMAL HIGH (ref <18)
Troponin I (High Sensitivity): 76 ng/L — ABNORMAL HIGH (ref <18)

## 2021-01-20 LAB — HIV ANTIBODY (ROUTINE TESTING W REFLEX): HIV Screen 4th Generation wRfx: NONREACTIVE

## 2021-01-20 LAB — ECHOCARDIOGRAM COMPLETE
S' Lateral: 4 cm
Single Plane A4C EF: 30.3 %

## 2021-01-20 LAB — TSH: TSH: 0.294 u[IU]/mL — ABNORMAL LOW (ref 0.350–4.500)

## 2021-01-20 LAB — CBG MONITORING, ED
Glucose-Capillary: 257 mg/dL — ABNORMAL HIGH (ref 70–99)
Glucose-Capillary: 257 mg/dL — ABNORMAL HIGH (ref 70–99)
Glucose-Capillary: 177 mg/dL — ABNORMAL HIGH (ref 70–99)
Glucose-Capillary: 275 mg/dL — ABNORMAL HIGH (ref 70–99)

## 2021-01-20 LAB — MAGNESIUM
Magnesium: 5.6 mg/dL — ABNORMAL HIGH (ref 1.7–2.4)
Magnesium: 2.5 mg/dL — ABNORMAL HIGH (ref 1.7–2.4)

## 2021-01-20 LAB — PROCALCITONIN: Procalcitonin: 0.4 ng/mL

## 2021-01-20 LAB — BRAIN NATRIURETIC PEPTIDE: B Natriuretic Peptide: 206.1 pg/mL — ABNORMAL HIGH (ref 0.0–100.0)

## 2021-01-20 LAB — PATHOLOGIST SMEAR REVIEW

## 2021-01-20 MED ORDER — DULOXETINE HCL 60 MG PO CPEP
120.0000 mg | ORAL_CAPSULE | Freq: Every day | ORAL | Status: DC
  Administered 2021-01-20 – 2021-01-24 (×5): 120 mg via ORAL
  Filled 2021-01-20 (×6): qty 2

## 2021-01-20 MED ORDER — INSULIN ASPART 100 UNIT/ML ~~LOC~~ SOLN
0.0000 [IU] | Freq: Three times a day (TID) | SUBCUTANEOUS | Status: DC
  Administered 2021-01-20: 3 [IU] via SUBCUTANEOUS
  Administered 2021-01-20 – 2021-01-21 (×2): 8 [IU] via SUBCUTANEOUS
  Administered 2021-01-21: 5 [IU] via SUBCUTANEOUS
  Administered 2021-01-21 – 2021-01-22 (×2): 2 [IU] via SUBCUTANEOUS
  Administered 2021-01-22 – 2021-01-23 (×3): 3 [IU] via SUBCUTANEOUS
  Administered 2021-01-23: 8 [IU] via SUBCUTANEOUS
  Administered 2021-01-23: 3 [IU] via SUBCUTANEOUS
  Administered 2021-01-24: 8 [IU] via SUBCUTANEOUS
  Administered 2021-01-24: 2 [IU] via SUBCUTANEOUS

## 2021-01-20 MED ORDER — PRAVASTATIN SODIUM 40 MG PO TABS
80.0000 mg | ORAL_TABLET | Freq: Every evening | ORAL | Status: DC
  Administered 2021-01-20 – 2021-01-24 (×5): 80 mg via ORAL
  Filled 2021-01-20 (×5): qty 2

## 2021-01-20 MED ORDER — ALBUTEROL SULFATE (2.5 MG/3ML) 0.083% IN NEBU: 2.5000 mg | INHALATION_SOLUTION | RESPIRATORY_TRACT | Status: DC | PRN

## 2021-01-20 MED ORDER — LORAZEPAM 2 MG/ML IJ SOLN: 0.5000 mg | Freq: Once | INTRAMUSCULAR | Status: DC

## 2021-01-20 MED ORDER — METOPROLOL TARTRATE 50 MG PO TABS
50.0000 mg | ORAL_TABLET | Freq: Two times a day (BID) | ORAL | Status: AC
  Administered 2021-01-20 – 2021-01-21 (×4): 50 mg via ORAL
  Filled 2021-01-20 (×3): qty 2
  Filled 2021-01-20: qty 1

## 2021-01-20 MED ORDER — ONDANSETRON HCL 4 MG/2ML IJ SOLN: 4.0000 mg | Freq: Four times a day (QID) | INTRAMUSCULAR | Status: DC | PRN

## 2021-01-20 MED ORDER — LEVOTHYROXINE SODIUM 75 MCG PO TABS
75.0000 ug | ORAL_TABLET | Freq: Every day | ORAL | Status: DC
  Administered 2021-01-21 – 2021-01-24 (×4): 75 ug via ORAL
  Filled 2021-01-20 (×4): qty 1

## 2021-01-20 MED ORDER — ONDANSETRON HCL 4 MG/2ML IJ SOLN
INTRAMUSCULAR | Status: AC
  Administered 2021-01-20: 4 mg via INTRAVENOUS
  Filled 2021-01-20: qty 2

## 2021-01-20 MED ORDER — LOSARTAN POTASSIUM 50 MG PO TABS
50.0000 mg | ORAL_TABLET | Freq: Every day | ORAL | Status: DC
  Administered 2021-01-21: 50 mg via ORAL
  Filled 2021-01-20: qty 1

## 2021-01-20 MED ORDER — LEVOTHYROXINE SODIUM 88 MCG PO TABS
88.0000 ug | ORAL_TABLET | Freq: Every day | ORAL | Status: DC
  Administered 2021-01-20: 88 ug via ORAL
  Filled 2021-01-20: qty 1

## 2021-01-20 MED ORDER — ACETAMINOPHEN 325 MG PO TABS
650.0000 mg | ORAL_TABLET | Freq: Four times a day (QID) | ORAL | Status: DC | PRN
  Administered 2021-01-21 – 2021-01-22 (×2): 650 mg via ORAL
  Filled 2021-01-20 (×2): qty 2

## 2021-01-20 MED ORDER — OXYCODONE HCL 5 MG PO TABS
5.0000 mg | ORAL_TABLET | ORAL | Status: DC | PRN
  Administered 2021-01-21 – 2021-01-22 (×2): 5 mg via ORAL
  Filled 2021-01-20 (×2): qty 1

## 2021-01-20 MED ORDER — ONDANSETRON HCL 4 MG PO TABS: 4.0000 mg | ORAL_TABLET | Freq: Four times a day (QID) | ORAL | Status: DC | PRN

## 2021-01-20 MED ORDER — SPIRONOLACTONE 25 MG PO TABS
25.0000 mg | ORAL_TABLET | Freq: Every day | ORAL | Status: DC
  Administered 2021-01-20 – 2021-01-24 (×5): 25 mg via ORAL
  Filled 2021-01-20 (×6): qty 1

## 2021-01-20 MED ORDER — SODIUM CHLORIDE 0.9 % IV SOLN
1.0000 g | Freq: Once | INTRAVENOUS | Status: AC
  Administered 2021-01-20: 1 g via INTRAVENOUS
  Filled 2021-01-20: qty 10

## 2021-01-20 MED ORDER — FUROSEMIDE 10 MG/ML IJ SOLN
20.0000 mg | Freq: Once | INTRAMUSCULAR | Status: AC
  Administered 2021-01-20: 20 mg via INTRAVENOUS
  Filled 2021-01-20: qty 2

## 2021-01-20 MED ORDER — ALPRAZOLAM 0.5 MG PO TABS
0.5000 mg | ORAL_TABLET | Freq: Three times a day (TID) | ORAL | Status: DC | PRN
  Administered 2021-01-21 – 2021-01-24 (×6): 0.5 mg via ORAL
  Filled 2021-01-20 (×6): qty 1

## 2021-01-20 MED ORDER — FUROSEMIDE 10 MG/ML IJ SOLN
40.0000 mg | Freq: Two times a day (BID) | INTRAMUSCULAR | Status: DC
  Administered 2021-01-20 – 2021-01-21 (×2): 40 mg via INTRAVENOUS
  Filled 2021-01-20 (×2): qty 4

## 2021-01-20 MED ORDER — NICOTINE 14 MG/24HR TD PT24
14.0000 mg | MEDICATED_PATCH | Freq: Every day | TRANSDERMAL | Status: DC
  Administered 2021-01-21: 14 mg via TRANSDERMAL
  Filled 2021-01-20 (×3): qty 1

## 2021-01-20 MED ORDER — POTASSIUM CHLORIDE CRYS ER 20 MEQ PO TBCR
40.0000 meq | EXTENDED_RELEASE_TABLET | Freq: Two times a day (BID) | ORAL | Status: DC
  Administered 2021-01-20: 40 meq via ORAL
  Filled 2021-01-20: qty 2

## 2021-01-20 MED ORDER — LORAZEPAM 2 MG/ML IJ SOLN: 1.0000 mg | Freq: Once | INTRAMUSCULAR | Status: AC

## 2021-01-20 MED ORDER — BISACODYL 5 MG PO TBEC: 5.0000 mg | DELAYED_RELEASE_TABLET | Freq: Every day | ORAL | Status: DC | PRN

## 2021-01-20 MED ORDER — LORAZEPAM 2 MG/ML IJ SOLN
INTRAMUSCULAR | Status: AC
  Administered 2021-01-20: 1 mg via INTRAVENOUS
  Filled 2021-01-20: qty 1

## 2021-01-20 MED ORDER — POLYETHYLENE GLYCOL 3350 17 G PO PACK: 17.0000 g | PACK | Freq: Every day | ORAL | Status: DC | PRN

## 2021-01-20 MED ORDER — ENOXAPARIN SODIUM 40 MG/0.4ML ~~LOC~~ SOLN
40.0000 mg | SUBCUTANEOUS | Status: DC
  Administered 2021-01-20 – 2021-01-24 (×4): 40 mg via SUBCUTANEOUS
  Filled 2021-01-20 (×4): qty 0.4

## 2021-01-20 MED ORDER — POTASSIUM CHLORIDE CRYS ER 20 MEQ PO TBCR
40.0000 meq | EXTENDED_RELEASE_TABLET | Freq: Every day | ORAL | Status: DC
  Administered 2021-01-21: 40 meq via ORAL
  Filled 2021-01-20: qty 2

## 2021-01-20 MED ORDER — ONDANSETRON HCL 4 MG/2ML IJ SOLN: 4.0000 mg | Freq: Once | INTRAMUSCULAR | Status: AC

## 2021-01-20 MED ORDER — ASPIRIN EC 81 MG PO TBEC
81.0000 mg | DELAYED_RELEASE_TABLET | Freq: Every day | ORAL | Status: DC
  Administered 2021-01-20 – 2021-01-24 (×4): 81 mg via ORAL
  Filled 2021-01-20 (×4): qty 1

## 2021-01-20 MED ORDER — INSULIN ASPART 100 UNIT/ML ~~LOC~~ SOLN
0.0000 [IU] | Freq: Every day | SUBCUTANEOUS | Status: DC
  Administered 2021-01-20: 3 [IU] via SUBCUTANEOUS
  Administered 2021-01-21 – 2021-01-22 (×2): 2 [IU] via SUBCUTANEOUS

## 2021-01-20 MED ORDER — DOCUSATE SODIUM 100 MG PO CAPS
100.0000 mg | ORAL_CAPSULE | Freq: Two times a day (BID) | ORAL | Status: DC
  Administered 2021-01-20 – 2021-01-24 (×7): 100 mg via ORAL
  Filled 2021-01-20 (×8): qty 1

## 2021-01-20 MED ORDER — INSULIN GLARGINE 100 UNIT/ML ~~LOC~~ SOLN
38.0000 [IU] | Freq: Every day | SUBCUTANEOUS | Status: DC
  Administered 2021-01-20 – 2021-01-24 (×5): 38 [IU] via SUBCUTANEOUS
  Filled 2021-01-20 (×5): qty 0.38

## 2021-01-20 MED ORDER — ACETAMINOPHEN 650 MG RE SUPP: 650.0000 mg | Freq: Four times a day (QID) | RECTAL | Status: DC | PRN

## 2021-01-20 MED ORDER — MORPHINE SULFATE (PF) 2 MG/ML IV SOLN: 2.0000 mg | INTRAVENOUS | Status: DC | PRN

## 2021-01-20 NOTE — ED Notes (Signed)
Family at bedside, updated on pt status/ plan of care °

## 2021-01-20 NOTE — Progress Notes (Signed)
Patient was taken off BIPAP per MD. Patient is tolerating 4L  well at this time. BIPAP is at the bedside on standby.

## 2021-01-20 NOTE — H&P (Signed)
History and Physical    Felicia Hanson:[id number] DOB: Apr 03, 1956 DOA: 01/20/2021  PCP: Shade Flood, MD Consultants:  Odis Hollingshead - cardiology Patient coming from:  Home - lives with husband, son is nearby; NOK: [place], [place], [phone]  Chief Complaint:  Respiratory distress  HPI: Felicia Hanson is a 65 y.o. female with medical history significant of hypothyroidism; HTN; HLD; DM; CAD; and depression/anxiety presenting with respiratory distress.  She reports LE edema for about a month.  Yesterday was a fairly typical lazy Sunday.  Last night, she became acutely SOB with wheezing and clear white copious sputum.  Initially, she thought it was anxiety but it did not improve and progressed to the point where she was in and out of consciousness.  They called 911 and she was brought in and placed on BIPAP.  By the time I saw her, she felt significantly better and has since been weaned to room air.  She was having mild chest tightness.  She reports that her cardiologist has been encouraging her to have a diagnostic catheterization.    ED Course:  Carryover, per Dr. Antionette Char:  39 yof with hx of COPD, HFrEF, LBBB, anxiety, IDDM, HTN, and hypothyroid p/w acute respiratory distress, reportedly unresponsive with EMS initially, was treated with BVM initially, woke up and was started on CPAP, and also treated with 125 mg IV Solu-Medrol and IV magnesium pta. In ED, she is afebrile with stable vitals on BiPAP, has hazy bilateral opacities on CXR, LBBB on EKG, negative COVID, and labs with leukocytosis, mild renal insufficiency, troponin 28 then 66, and BNP 206. ED reviewed case with cardiology fellow who had low suspicion for ACS. She was treated with BiPAP, Rocephin, Ativan, and Lasix in ED. Third troponin and procalcitonin are pending.    Review of Systems: As per HPI; otherwise review of systems reviewed and negative.   Ambulatory Status:  Ambulates without assistance  COVID Vaccine Status:    Complete plus booster  Past Medical History:  Diagnosis Date  . Anxiety   . Coronary artery calcification   . Depression   . Diabetes mellitus without complication (HCC)   . Hyperlipidemia   . Hypertension   . Thyroid disease     Past Surgical History:  Procedure Laterality Date  . BREAST EXCISIONAL BIOPSY Left   . CHOLECYSTECTOMY    . COLON SURGERY     diverticulitis with perforation; s/p colon resection.  . Colonoscopy    . TUBAL LIGATION      Social History   Socioeconomic History  . Marital status: Married    Spouse name: Not on file  . Number of children: 2  . Years of education: Not on file  . Highest education level: Not on file  Occupational History  . Not on file  Tobacco Use  . Smoking status: Current Every Day Smoker    Packs/day: 0.75    Years: 38.00    Pack years: 28.50    Types: Cigarettes  . Smokeless tobacco: Never Used  Vaping Use  . Vaping Use: Former  Substance and Sexual Activity  . Alcohol use: No    Alcohol/week: 0.0 standard drinks  . Drug use: No  . Sexual activity: Yes  Other Topics Concern  . 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 Metformin And Related Anaphylaxis    Could not swallow  . Ace Inhibitors Cough    Cough with lisinopril.     Family History  Problem Relation Age of Onset  . Cancer Mother        unknown primary  . Hyperlipidemia Mother   . Breast cancer Paternal Aunt     Prior to Admission medications   Medication  Sig Start Date End Date Taking? Authorizing Provider  ACCU-CHEK AVIVA PLUS test strip daily. for testing 03/11/18  Yes [provider]  ALPRAZolam Prudy Feeler) 1 MG tablet Take 0.5 mg by mouth 3 (three) times daily as needed for anxiety or sleep.  08/21/13  Yes [provider]  amphetamine-dextroamphetamine (ADDERALL) 20 MG tablet Take 20 mg by mouth 3 (three) times daily. 11/21/20  Yes [provider]  aspirin 81 MG tablet Take 81 mg by mouth daily.   Yes [provider]  betamethasone dipropionate 0.05 % lotion Apply 1 application topically as needed (rash). 04/10/20  Yes [provider]  cetirizine (ZYRTEC) 10 MG tablet Take 1 tablet (10 mg total) by mouth daily. Patient taking differently: Take 10 mg by mouth daily as needed for allergies. 10/18/19  Yes Doristine Bosworth, MD  Continuous Blood Gluc Receiver (DEXCOM G6 RECEIVER) DEVI 1 Device by Does not apply route every morning. Device and supplies 04/18/20  Yes Shade Flood, MD  Dulaglutide (TRULICITY) 3 MG/0.5ML SOPN Inject 3 mg as directed once a week. Patient taking differently: Inject 3 mg as directed every Thursday. 11/06/20  Yes Shade Flood, MD  DULoxetine (CYMBALTA) 60 MG capsule Take 2 capsules (120 mg total) by mouth daily. 12/06/20  Yes Shade Flood, MD  insulin glargine (LANTUS SOLOSTAR) 100 UNIT/ML Solostar Pen Inject 38 Units into the skin daily. 12/31/20  Yes Shade Flood, MD  Insulin Pen Needle 31G X 5 MM MISC Inject 1 each into the skin daily. 09/06/20  Yes Janeece Agee, NP  levothyroxine (SYNTHROID) 88 MCG tablet TAKE 1 TABLET (88 MCG TOTAL) BY MOUTH DAILY BEFORE BREAKFAST. Patient taking differently: Take 88 mcg by mouth daily before breakfast. 01/19/21  Yes Shade Flood, MD  losartan (COZAAR) 50 MG tablet Take 1 tablet (50 mg total) by mouth daily. 01/18/21 04/18/21 Yes Shade Flood, MD  metoprolol tartrate (LOPRESSOR) 50 MG tablet Take 1 tablet (50 mg total) by  mouth 2 (two) times daily. 12/06/20  Yes Shade Flood, MD  nitroGLYCERIN (NITROSTAT) 0.4 MG SL tablet PLACE 1 TABLET UNDER THE TONGUE EVERY 5 MINUTES AS NEEDED FOR CHEST PAIN. IF YOU REQUIRE MORE THAN TWO TABLETS FIVE MINUTES APART GO TO THE NEAREST ER VIA EMS. Patient taking differently: Place 0.4 mg under the tongue every 5 (five) minutes as needed for chest pain. 07/29/20  Yes Tolia, Sunit, DO  ondansetron (ZOFRAN ODT) 4 MG disintegrating tablet Take 1 tablet (4 mg total) by mouth every 8 (eight) hours as needed for nausea or vomiting. 07/14/19  Yes Collie Siad A, MD  pravastatin (PRAVACHOL) 80 MG  tablet Take 1 tablet (80 mg total) by mouth every evening. 12/06/20 03/06/21 Yes Shade Flood, MD  triamcinolone cream (KENALOG) 0.5 % Apply 1 application topically 3 (three) times daily. Apply to back and forearms, avoid the face 10/18/19  Yes Stallings, Zoe A, MD  Vitamin D, Ergocalciferol, (DRISDOL) 1.25 MG (50000 UNIT) CAPS capsule TAKE 1 CAPSULE (50,000 UNITS TOTAL) BY MOUTH EVERY 7 (SEVEN) DAYS. 01/12/21  Yes Shade Flood, MD  doxycycline (VIBRA-TABS) 100 MG tablet Take 1 tablet (100 mg total) by mouth 2 (two) times daily. Patient not taking: No sig reported 12/30/20   Shade Flood, MD    Physical Exam: Vitals:   01/20/21 1015 01/20/21 1030 01/20/21 1045 01/20/21 1100  BP: 138/69 (!) 148/74 138/65 (!) 141/69  Pulse: 85 84 81 81  Resp: (!) 25 17 16  (!) 22  Temp:      TempSrc:      SpO2: 99% 97% 93% 90%     . General:  Appears calm and comfortable and is in NAD; on BIPAP at the time of my exam . Eyes:   EOMI, normal lids, iris . ENT:  grossly normal hearing, lips & tongue; BIPAP mask in place . Neck:  no LAD, masses or thyromegaly . Cardiovascular:  RRR, no m/r/g. No LE edema.  Respiratory:   CTA bilaterally with no wheezes/rales/rhonchi.  Normal respiratory effort, on BIPAP . Abdomen:  soft, NT, ND, NABS . Skin:  no rash or induration seen on limited  exam . Musculoskeletal:  grossly normal tone BUE/BLE, good ROM, no bony abnormality . Psychiatric:  grossly normal mood and affect, speech fluent and appropriate despite BIPAP, AOx3 . Neurologic:  CN 2-12 grossly intact, moves all extremities in coordinated fashion    Radiological Exams on Admission: Independently reviewed - see discussion in A/P where applicable  DG Chest Portable 1 View  Result Date: 01/20/2021 CLINICAL DATA:  Shortness of breath EXAM: PORTABLE CHEST 1 VIEW COMPARISON:  04/18/2020 FINDINGS: Mixed hazy and patchy opacities present in the mid to lower lungs, more focally coalescent in the right lung base. No pneumothorax or visible effusion the portions of the costophrenic sulci are collimated from view. Prominent cardiomediastinal contours though possibly accentuated by portable technique. Remaining cardiomediastinal contours are unremarkable. No acute osseous or soft tissue abnormality. Telemetry leads overlie the chest. IMPRESSION: Mixed hazy and patchy opacities in the mid to lower lungs, more focally coalescent in the right lung base, could acute infection/inflammation or asymmetric edema. Electronically Signed   By: 04/20/2020 M.D.   On: 01/20/2021 01:54    EKG: Independently reviewed.   0139 - NSR with rate 88; LBBB 0200 - NSR with rate 86; LBBB  Labs on Admission: I have personally reviewed the available labs and imaging studies at the time of the admission.  Pertinent labs:   ABG: 7.342/45.5/87.24.7/96% CO2 17 Glucose 236 BUN 8/Creatinine 1.29/GFR 45; 6/0.9/>60 on 12/06/20 BNP 205.1 HS troponin 28, 66 WBC 17.8 Hgb 15.7 COVID/flu negative Lipids on 12/06/20: 153/46/80/158   Assessment/Plan Principal Problem:   Respiratory distress Active Problems:   Generalized anxiety disorder   Essential hypertension, benign   Pure hypercholesterolemia   Hypothyroidism   Type 2 diabetes mellitus without complication, without long-term current use of insulin (HCC)    Acute on chronic combined systolic and diastolic CHF (congestive heart failure) (HCC)   Acute respiratory distress associated with acute on chronic combined CHF -Patient with known h/o chronic combined CHF presenting with respiratory distress -  suspect flash pulmonary edema -She received bag-valve respirations via EMS and it is still present at the bedside -04/23/20 echo with EF 35-40% and grade 1 diastolic dysfunction, unchanged from 2016; Myoview on 05/13/20 without apparent ischemia -CXR consistent with pulmonary edema -Mildly elevated BNP without known baseline -With elevated BNP and abnl CXR, acute decompensated CHF seems probable as diagnosis -Patient was placed on BIPAP with improved respiratory symptoms; she has since been weaned to room air without difficulty -Will admit, as per the Emergency HF Mortality Risk Grade.  -Will request repeat echocardiogram -Will continue ASA -Continue ACE/BB -CHF order set utilized -Cardiology consult -Was given Lasix 40 mg x 1 in ER and will repeat with 40 mg IV BID -Continue  O2 for now -Mildly elevated kidney function at this time, will follow and anticipate improvement overnight -Repeat EKG in AM -Mildly elevated HS troponin is likely related to demand ischemia; doubt ACS based on symptoms -There has been discussion of need for cath and this may be the impetus for cath to take place  HTN -Continue Cozaar (resume tomorrow due to mild AKI associated with decreased renal perfusion), Lopressor (today) -Will also add prn hydralazine  HLD -Continue Pravachol -Lipids were checked in 11/2020; LDL was 80 and so transition to high-potency statin such as Lipitor may be appropriate  DM -Recent A1c was 6.8, indicating good control -Hold Trulicity -Continue Lantus -Will cover with moderate-scale SSI for now  Hypothyroidism -Check TSH - 0.294 so slightly depressed -Will decrease Synthroid dose from 88 mcg daily to 75 mcg -Will need repeat TSH in 4-6  weeks  Depression/anxiety -Continue prn Xanax -Continue Cymbalta -She also takes Adderall (which may increase anxiety) - will hold, as she will not be performing tasks that require attention and focus as an inpatient    Note: This patient has been tested and is negative for the novel coronavirus COVID-19. She has been fully vaccinated against COVID-19.   DVT prophylaxis: Lovenox  Code Status:  DNR - confirmed with patient/family Family Communication: Son was present throughout evaluation Disposition Plan:  The patient is from: home  Anticipated d/c is to: home, possible with [facility] services  Anticipated d/c date will depend on clinical response to treatment, likely 2-4 days  Patient is currently: acutely ill Consults called: Cardiology; [facility] team; PT/OT; Diabetes coordinator; nutrition Admission status: Admit - It is my clinical opinion that admission to INPATIENT is reasonable and necessary because this patient will require at least 2 midnights in the hospital to treat this condition based on the medical complexity of the problems presented.  Given the aforementioned information, the predictability of an adverse outcome is felt to be significant.    Jonah Blue MD [facility]   How to contact the [facility] Attending or Consulting provider 7A - 7P or covering provider during after hours 7P -7A, for this patient?  1. Check the care team in [facility] and look for a) attending/consulting [facility] provider listed and b) the [facility] team listed 2. Log into [email or web address] and use [facility]'s universal password to access. If you do not have the password, please contact the hospital operator. 3. Locate the [facility] provider you are looking for under [facility] and page to a number that you can be directly reached. 4. If you still have difficulty reaching the provider, please page the [facility] (Director on Call) for the Hospitalists listed on amion for assistance.   01/20/2021, 12:52 PM

## 2021-01-20 NOTE — Progress Notes (Signed)
  Echocardiogram 2D Echocardiogram has been performed.  Felicia Hanson 01/20/2021, 1:54 PM

## 2021-01-20 NOTE — Progress Notes (Signed)
                       Inpatient Diabetes Program Recommendations  AACE/ADA: New Consensus Statement on Inpatient Glycemic Control (2015)  Target Ranges:  Prepandial:   less than 140 mg/dL      Peak postprandial:   less than 180 mg/dL (1-2 hours)      Critically ill patients:  140 - 180 mg/dL   Lab Results  Component Value Date   GLUCAP 257 (H) 01/20/2021   HGBA1C 6.8 (H) 12/06/2020    Review of Glycemic Control  Diabetes history: type 2 Outpatient Diabetes medications: Lantus 38 units daily, Trulicity 3 mg weekly Current orders for Inpatient glycemic control: Lantus 38 units daily, Novolog MODERATE correction scale TID & HS  Inpatient Diabetes Program Recommendations:    Received diabetes coordinator consult. Noted that HgbA1C is 6.8% which means that this patient's glucose control is within a normal limit of less than 7% for patients with diabetes.  Admitting CBG was 257 mg/dl.   Will continue to monitor blood sugars while in the hospital.   [name] RN BSN CDE Diabetes Coordinator Pager: [phone]  8am-5pm

## 2021-01-20 NOTE — ED Triage Notes (Signed)
EMS called out for Resp Distress, pt unresponsive on arrival. After approx BVM, pt responsive enough for CPAP. 2mg  Mag, 125mg  solumedrol given en route Hx COPD, CHF, HTN, DM  20LAC  MD Trifan at bedside on arrival

## 2021-01-20 NOTE — Consult Note (Addendum)
CARDIOLOGY CONSULT NOTE  Patient ID: TERYL OSTERHOUDT MRN: [id number] DOB/AGE: 06/21/1956 65 y.o.  Admit date: 01/20/2021 Attending physician: Briscoe Deutscher, MD Primary Physician:  Shade Flood, MD Outpatient Cardiologist: Tessa Lerner, DO, [facility] Inpatient Cardiologist: Tessa Lerner, DO, [facility] Referring physician: Dr. Jonah Blue  Chief complaint: Shortness of breath Reason of consultation: Congestive heart failure  HPI:  Felicia Hanson is a 65 y.o. Caucasian female who presents with a chief complaint of "Shortness of breath." Her past medical history and cardiovascular risk factors include: insulin-dependent type 2 diabetes with hyperglycemia, pure hypercholesterolemia, hypertriglyceridemia, coronary artery calcification, newly diagnosed left bundle branch block, essential hypertension, hypothyroidism, tobacco use disorder, vitamin D deficiency, generalized anxiety disorder.  Patient is accompanied by her son Caryn Bee at bedside at the time of the evaluation.  It appears the patient was experiencing respiratory distress and requested her son to perform mouth-to-mouth while they were at home.  Patient's son called 911 and EMS arrived very shortly thereafter.  Patient's mentation continued to deteriorate requiring bag mask ventilation followed by CPAP/BiPAP.  Patient did not need to be resuscitated.  She is currently in trauma bay 1 at the time of evaluation.  Currently patient is sitting in a supine position with the head of the bed elevated.  Currently on nasal cannula oxygen and off of BiPAP.  Patient states that her shortness of breath is improved significantly.  When I last saw her in the office July 2021 she was recommended to undergo a left heart catheterization as she continued to have chest pressure which may be cardiac in etiology.  She had undergone an echocardiogram which noted reduced LVEF and nuclear stress test was overall a high risk study due to reduced LVEF but no reversible  ischemia.  However due to continued symptoms and reduced LVEF balanced ischemia cannot be ruled out and therefore the shared decision was to proceed with left heart catheterization.  At that time patient stated that she wanted to wait for 4 weeks and then be scheduled for left heart catheterization and she would follow-up with me thereafter.  However patient was lost to follow-up.  When asked patient states that she had a lot of stresses in life with family members having Covid, anxiety, and other reasons.  Cardiology is consulted for further recommendations.  Work-up thus far includes: Negative COVID-19 PCR. Troponins slightly trending up without a significant delta change. BNP 206 Serum glucose 236. Serum creatinine 1.29 mg/dL, estimated GFR 46 White count 17.8K  ALLERGIES: Allergies  Allergen Reactions  . Metformin And Related Anaphylaxis    Could not swallow  . Ace Inhibitors Cough    Cough with lisinopril.     PAST MEDICAL HISTORY: Past Medical History:  Diagnosis Date  . Anxiety   . Coronary artery calcification   . Depression   . Diabetes mellitus without complication (HCC)   . Hyperlipidemia   . Hypertension   . Thyroid disease     PAST SURGICAL HISTORY: Past Surgical History:  Procedure Laterality Date  . BREAST EXCISIONAL BIOPSY Left   . CHOLECYSTECTOMY    . COLON SURGERY     diverticulitis with perforation; s/p colon resection.  . Colonoscopy    . TUBAL LIGATION      FAMILY HISTORY: The patient family history includes Breast cancer in her paternal aunt; Cancer in her mother; Hyperlipidemia in her mother.   SOCIAL HISTORY:  The patient  reports that she has been smoking cigarettes. She has a 28.50 pack-year smoking  history. She has never used smokeless tobacco. She reports that she does not drink alcohol and does not use drugs.  MEDICATIONS: Current Outpatient Medications  Medication Instructions  . ACCU-CHEK AVIVA PLUS test strip Daily, for testing   . ALPRAZolam (XANAX) 0.5 mg, Oral, 3 times daily PRN  . amphetamine-dextroamphetamine (ADDERALL) 20 MG tablet 20 mg, Oral, 3 times daily  . aspirin 81 mg, Daily  . betamethasone dipropionate 0.05 % lotion 1 application, Topical, As needed  . cetirizine (ZYRTEC) 10 mg, Oral, Daily  . Continuous Blood Gluc Receiver (DEXCOM G6 RECEIVER) DEVI 1 Device, Does not apply, Every morning, Device and supplies  . DULoxetine (CYMBALTA) 120 mg, Oral, Daily  . Insulin Pen Needle 31G X 5 MM MISC 1 each, Subcutaneous, Daily  . Lantus SoloStar 38 Units, Subcutaneous, Daily  . levothyroxine (SYNTHROID) 88 MCG tablet TAKE 1 TABLET (88 MCG TOTAL) BY MOUTH DAILY BEFORE BREAKFAST.  Marland Kitchen losartan (COZAAR) 50 mg, Oral, Daily  . metoprolol tartrate (LOPRESSOR) 50 mg, Oral, 2 times daily  . nitroGLYCERIN (NITROSTAT) 0.4 MG SL tablet PLACE 1 TABLET UNDER THE TONGUE EVERY 5 MINUTES AS NEEDED FOR CHEST PAIN. IF YOU REQUIRE MORE THAN TWO TABLETS FIVE MINUTES APART GO TO THE NEAREST ER VIA EMS.  . ondansetron (ZOFRAN ODT) 4 mg, Oral, Every 8 hours PRN  . pravastatin (PRAVACHOL) 80 mg, Oral, Every evening  . triamcinolone cream (KENALOG) 0.5 % 1 application, Topical, 3 times daily, Apply to back and forearms, avoid the face  . Trulicity 3 mg, Injection, Weekly  . Vitamin D (Ergocalciferol) (DRISDOL) 50,000 Units, Oral, Every 7 days    REVIEW OF SYSTEMS: Review of Systems  Constitutional: Negative for chills and fever.  HENT: Negative for hoarse voice and nosebleeds.   Eyes: Negative for discharge, double vision and pain.  Cardiovascular: Positive for dyspnea on exertion, leg swelling and orthopnea. Negative for chest pain, claudication, near-syncope, palpitations, paroxysmal nocturnal dyspnea and syncope.  Respiratory: Positive for cough and shortness of breath. Negative for hemoptysis.   Musculoskeletal: Negative for muscle cramps and myalgias.  Gastrointestinal: Negative for abdominal pain, constipation, diarrhea,  hematemesis, hematochezia, melena, nausea and vomiting.  Neurological: Negative for dizziness and light-headedness.   PHYSICAL EXAM: Vitals with BMI 01/20/2021 01/20/2021 01/20/2021  Height - - -  Weight - - -  BMI - - -  Systolic 145 139 098  Diastolic 70 70 67  Pulse - 87 86   CONSTITUTIONAL: Appears older than stated age, hemodynamically stable, well-nourished. No acute distress.  SKIN: Skin is warm and dry. No cyanosis. No pallor. No jaundice HEAD: Normocephalic and atraumatic.  EYES: No scleral icterus MOUTH/THROAT: Dry oral membranes.  NECK: No JVD present. No thyromegaly noted. No carotid bruits  LYMPHATIC: No visible cervical adenopathy.  CHEST Normal respiratory effort. No intercostal retractions  LUNGS: Decreased breath sounds bilaterally.  Crackles noted bilaterally.  No stridor. No wheezes.   CARDIOVASCULAR: Regular, positive S1-S2, no murmurs rubs or gallops appreciated. ABDOMINAL: Obese, soft, nontender, nondistended, positive bowel sounds in all 4 quadrants, no apparent ascites.  EXTREMITIES: Trace bilaterally peripheral edema, tender to touch. HEMATOLOGIC: No significant bruising NEUROLOGIC: Oriented to person, place, and time. Nonfocal. Normal muscle tone.  PSYCHIATRIC: Normal mood and affect. Normal behavior. Cooperative  RADIOLOGY: DG Chest Portable 1 View  Result Date: 01/20/2021 CLINICAL DATA:  Shortness of breath EXAM: PORTABLE CHEST 1 VIEW COMPARISON:  04/18/2020 FINDINGS: Mixed hazy and patchy opacities present in the mid to lower lungs, more focally coalescent in the right  lung base. No pneumothorax or visible effusion the portions of the costophrenic sulci are collimated from view. Prominent cardiomediastinal contours though possibly accentuated by portable technique. Remaining cardiomediastinal contours are unremarkable. No acute osseous or soft tissue abnormality. Telemetry leads overlie the chest. IMPRESSION: Mixed hazy and patchy opacities in the mid to lower  lungs, more focally coalescent in the right lung base, could acute infection/inflammation or asymmetric edema. Electronically Signed   By: Kreg Shropshire M.D.   On: 01/20/2021 01:54    LABORATORY DATA: Lab Results  Component Value Date   WBC 17.8 (H) 01/20/2021   HGB 13.9 01/20/2021   HCT 41.0 01/20/2021   MCV 92.5 01/20/2021   PLT 234 01/20/2021    Recent Labs  Lab 01/20/21 0145 01/20/21 0220  NA 135 137  K 3.5 3.2*  CL 98  --   CO2 17*  --   BUN 8  --   CREATININE 1.29*  --   CALCIUM 8.6*  --   PROT 7.3  --   BILITOT 1.0  --   ALKPHOS 68  --   ALT 18  --   AST 37  --   GLUCOSE 236*  --     Lipid Panel     Component Value Date/Time   CHOL 153 12/06/2020 1638   TRIG 158 (H) 12/06/2020 1638   HDL 46 12/06/2020 1638   CHOLHDL 3.3 12/06/2020 1638   CHOLHDL 5.0 08/09/2015 1504   VLDL 44 (H) 08/09/2015 1504   LDLCALC 80 12/06/2020 1638    BNP (last 3 results) Recent Labs    01/20/21 0159  BNP 206.1*    HEMOGLOBIN A1C Lab Results  Component Value Date   HGBA1C 6.8 (H) 12/06/2020   MPG 114 01/10/2014    Cardiac Panel (last 3 results) Recent Labs    01/20/21 0145 01/20/21 0356 01/20/21 0651  TROPONINIHS 28* 66* 76*    TSH Recent Labs    04/03/20 1757 09/06/20 1152  TSH 0.732 1.160      CARDIAC DATABASE: EKG: 01/20/2021: Normal sinus rhythm, 88 bpm, left axis deviation, biatrial enlargement, left bundle branch block.  Coronary calcium scoring 05/13/2015: LAD: 8, Cx: 2. Total calcium score is 10.   Echocardiogram: 03/2015: LVEF 44%, mild LVH, mild to moderate decrease in global wall motion, grade 2 diastolic impairment, mildly dilated left atrium, mild MR and TR.  04/23/2020: Left ventricle cavity is normal in size. Moderate concentric hypertrophy of the left ventricle. Moderate global hypokinesis. LVEF 35-40%. Indeterminate diastolic function due to E/A fusion.  Moderately dilated left atrium, mild MR.  Stress Testing: Lexiscan Tetrofosmin  Stress Test 05/13/2020:  There is a fixed mild defect in the inferior region due to diaphragmatic attenuation. No ischemia or scar.  The LV is moderately dilated with LV end diastolic volume of 173 mL.  Overall LV systolic function is abnormal without regional wall motion abnormalities. Stress LV EF: 21%.   High risk study due to low LVEF. Findings suggest non ischemic dilated cardiomyopathy.  Heart Catheterization: None   IMPRESSION & RECOMMENDATIONS: NYSA SARIN is a 65 y.o. Caucasian female whose past medical history and cardiovascular risk factors include:  insulin-dependent type 2 diabetes with hyperglycemia, pure hypercholesterolemia, hypertriglyceridemia, coronary artery calcification, newly diagnosed left bundle branch block, essential hypertension, hypothyroidism, tobacco use disorder, vitamin D deficiency, generalized anxiety disorder.  Impression: Acute combined systolic and diastolic heart failure, stage C, NYHA class III  Acute respiratory distress requiring BiPAP intervention: Improving  Elevated troponins most likely  secondary to supply demand ischemia in the setting of acute respiratory distress and heart failure exacerbation.  Calcified atherosclerotic coronary artery disease.  Left bundle branch block.  Insulin-dependent diabetes mellitus type 2  Benign essential hypertension with chronic kidney disease stage III  Hypertriglyceridemia.  Active tobacco use  Plan: Patient presents to the hospital with acute respiratory distress requiring BiPAP intervention which is most likely multifactorial given her underlying cardiomyopathy and tobacco use.  She responded well to BiPAP and is currently on nasal cannula oxygen.  Recommend continuation of home doses of cardiac medications.  Start spironolactone 25 mg p.o. daily and change Kdur to once a day to prevent hyperkalemia.  Continue home dose beta-blocker therapy.  Prior to discharge would recommend transitioning from  losartan to [facility] and considering Marcelline DeistFarxiga if renal function allows.  Echocardiogram will be ordered to evaluate for structural heart disease and left ventricular systolic function.  During the last office visit patient continued to have chest discomfort along with effort related dyspnea.  Noninvasive evaluation suggestive of nonischemic cardiomyopathy.  However due to continued symptoms despite guideline directed medical therapy the possibility of balanced ischemia cannot be ruled out in the patient with multiple cardiovascular risk factors including poorly controlled diabetes and active smoking.  Therefore the shared decision was to proceed with left heart catheterization.  However patient failed to follow up and now presents to the hospital for acute heart failure exacerbation and in respiratory distress.  Had a detailed discussion with both the patient and her son at bedside.  They are in agreement with proceeding with left and right heart catheterization with possible intervention prior to discharge.  Would like to get her more stabilized from a respiratory standpoint, continue diuresis to get her euvolemic, and will schedule a left and right heart catheterization potentially in the next 24 to 48 hours.  The procedure of left and right heart catheterization with possible intervention was explained to the patient and her son in detail. The indication, alternatives, risks and benefits were reviewed.  Complications include but not limited to bleeding, infection, vascular injury, stroke, myocardial infection, arrhythmia, kidney injury requiring hemodialysis, radiation-related injury in the case of prolonged fluoroscopy use, emergency cardiac surgery, need of pacemaker,  and death. The patient understands the risks of serious complication is 1-2 in 1000 with diagnostic cardiac cath and 1-2% or less with angioplasty/stenting.  The patient and her son voices understanding and provides verbal feedback and  wishes to proceed with coronary angiography with possible PCI.  Continue aspirin and statin therapy given the underlying coronary artery calcification.  Patient educated on the importance of complete smoking cessation.  Continue your care regarding her chronic comorbid conditions.  Plan of care discussed with both the patient and her son present at bedside.  Total encounter time 87 minutes. *Total Encounter Time as defined by the Centers for Medicare and Medicaid Services includes, in addition to the face-to-face time of a patient visit (documented in the note above) non-face-to-face time: obtaining and reviewing outside history, ordering and reviewing medications, tests or procedures, care coordination (communications with other health care professionals or caregivers) and documentation in the medical record.  Patient's questions and concerns were addressed to her satisfaction. She voices understanding of the instructions provided during this encounter.   This note was created using a voice recognition software as a result there may be grammatical errors inadvertently enclosed that do not reflect the nature of this encounter. Every attempt is made to correct such errors.  Pauleen Goleman McCaskillolia,  DO, [facility]  Pager: [phone] Office: [phone] 01/20/2021, 9:26 AM

## 2021-01-20 NOTE — ED Notes (Signed)
First insulin documentation documented under wrong insulin administration. Correct insulin documentation done with correct administration. See MAR.

## 2021-01-20 NOTE — ED Provider Notes (Signed)
[facility]   CSN: [id number] Arrival date & time: 01/20/21  0126     History Chief Complaint  Patient presents with  . Respiratory Distress    Felicia Hanson is a 65 y.o. female with a h/o of DM Type II, HTN, HLD, CAD, GAD, and hypothyroidism who presents to the Emergency Department from home with a chief complaint of respiratory distress.  Patient brought in by EMS from home with respiratory distress.  EMS reports that patient was unresponsive on arrival.  After approximately 10 minutes with bag valve mask, patient was responsive enough for CPAP.  She was given 2 mg of magnesium and 125 mg of Solu-Medrol in route.  -03:00 Spoke with son at bedside.  He reports that patient has been significantly more depressed and anxious over the last week.  She is a heavy smoker, but he has no concerns for drug or alcohol use.  No concerns for SI or potential overdose.  Reports that after EMS had been called that patient was gasping for air.  He was on the phone with a 911 operator and patient was bagging him to perform mouth-to-mouth because she felt as if she was not moving any air.  He was advised by 911 operator to avoid this measure.  When EMS arrived, the patient was initially alert, and gasping for air.  However, shortly after arrival she became unresponsive.  CPR was not initiated by EMS.  He received an update that approximately 2 to 3 minutes into EMS transport the patient became alert again.  Echocardiogram in May 2021 with LVEF of 35 to 40% moderate global hypokinesis.  She had a high risk stress test in June 2021.  She has a history of a left BBB.  Cardiology recommended left heart catheterization or coronary CTA.  In July 2021, she was agreeable to cardiac catheterization, but wanted to wait approximately 4 weeks for the procedure.  She has not followed up with cardiology since that time per chart review.  Level V caveat secondary to  respiratory distress.   The history is provided by the patient and medical records. No language interpreter was used.       Past Medical History:  Diagnosis Date  . Anxiety   . Coronary artery calcification   . Depression   . Diabetes mellitus without complication (HCC)   . Hyperlipidemia   . Hypertension   . Thyroid disease     Patient Active Problem List   Diagnosis Date Noted  . Acute respiratory failure (HCC) 01/20/2021  . Type 2 diabetes mellitus without complication, without long-term current use of insulin (HCC) 10/26/2017  . Generalized anxiety disorder 01/10/2014  . Vitamin D deficiency 01/10/2014  . Essential hypertension, benign 01/10/2014  . Pure hypercholesterolemia 01/10/2014  . Hypothyroidism 01/10/2014    Past Surgical History:  Procedure Laterality Date  . BREAST EXCISIONAL BIOPSY Left   . CHOLECYSTECTOMY    . COLON SURGERY     diverticulitis with perforation; s/p colon resection.  . Colonoscopy    . TUBAL LIGATION       OB History   No obstetric history on file.     Family History  Problem Relation Age of Onset  . Cancer Mother        unknown primary  . Hyperlipidemia Mother   . Breast cancer Paternal Aunt     Social History   Tobacco Use  . Smoking status: Current Every Day Smoker  Packs/day: 0.75    Years: 38.00    Pack years: 28.50    Types: Cigarettes  . Smokeless tobacco: Never Used  Vaping Use  . Vaping Use: Former  Substance Use Topics  . Alcohol use: No    Alcohol/week: 0.0 standard drinks  . Drug use: No    Home Medications Prior to Admission medications   Medication Sig Start Date End Date Taking? Authorizing Provider  ACCU-CHEK AVIVA PLUS test strip daily. for testing 03/11/18  Yes [provider]  ALPRAZolam Prudy Feeler) 1 MG tablet Take 0.5 mg by mouth 3 (three) times daily as needed for anxiety or sleep.  08/21/13  Yes [provider]  amphetamine-dextroamphetamine (ADDERALL) 20 MG tablet Take 20 mg  by mouth 3 (three) times daily. 11/21/20  Yes [provider]  aspirin 81 MG tablet Take 81 mg by mouth daily.   Yes [provider]  betamethasone dipropionate 0.05 % lotion Apply 1 application topically as needed (rash). 04/10/20  Yes [provider]  cetirizine (ZYRTEC) 10 MG tablet Take 1 tablet (10 mg total) by mouth daily. Patient taking differently: Take 10 mg by mouth daily as needed for allergies. 10/18/19  Yes Doristine Bosworth, MD  Continuous Blood Gluc Receiver (DEXCOM G6 RECEIVER) DEVI 1 Device by Does not apply route every morning. Device and supplies 04/18/20  Yes Shade Flood, MD  Dulaglutide (TRULICITY) 3 MG/0.5ML SOPN Inject 3 mg as directed once a week. Patient taking differently: Inject 3 mg as directed every Thursday. 11/06/20  Yes Shade Flood, MD  DULoxetine (CYMBALTA) 60 MG capsule Take 2 capsules (120 mg total) by mouth daily. 12/06/20  Yes Shade Flood, MD  insulin glargine (LANTUS SOLOSTAR) 100 UNIT/ML Solostar Pen Inject 38 Units into the skin daily. 12/31/20  Yes Shade Flood, MD  Insulin Pen Needle 31G X 5 MM MISC Inject 1 each into the skin daily. 09/06/20  Yes Janeece Agee, NP  levothyroxine (SYNTHROID) 88 MCG tablet TAKE 1 TABLET (88 MCG TOTAL) BY MOUTH DAILY BEFORE BREAKFAST. Patient taking differently: Take 88 mcg by mouth daily before breakfast. 01/19/21  Yes Shade Flood, MD  losartan (COZAAR) 50 MG tablet Take 1 tablet (50 mg total) by mouth daily. 01/18/21 04/18/21 Yes Shade Flood, MD  metoprolol tartrate (LOPRESSOR) 50 MG tablet Take 1 tablet (50 mg total) by mouth 2 (two) times daily. 12/06/20  Yes Shade Flood, MD  nitroGLYCERIN (NITROSTAT) 0.4 MG SL tablet PLACE 1 TABLET UNDER THE TONGUE EVERY 5 MINUTES AS NEEDED FOR CHEST PAIN. IF YOU REQUIRE MORE THAN TWO TABLETS FIVE MINUTES APART GO TO THE NEAREST ER VIA EMS. Patient taking differently: Place 0.4 mg under the tongue every 5 (five) minutes as needed for  chest pain. 07/29/20  Yes Tolia, Sunit, DO  ondansetron (ZOFRAN ODT) 4 MG disintegrating tablet Take 1 tablet (4 mg total) by mouth every 8 (eight) hours as needed for nausea or vomiting. 07/14/19  Yes Stallings, Zoe A, MD  pravastatin (PRAVACHOL) 80 MG tablet Take 1 tablet (80 mg total) by mouth every evening. 12/06/20 03/06/21 Yes Shade Flood, MD  triamcinolone cream (KENALOG) 0.5 % Apply 1 application topically 3 (three) times daily. Apply to back and forearms, avoid the face 10/18/19  Yes Stallings, Zoe A, MD  Vitamin D, Ergocalciferol, (DRISDOL) 1.25 MG (50000 UNIT) CAPS capsule TAKE 1 CAPSULE (50,000 UNITS TOTAL) BY MOUTH EVERY 7 (SEVEN) DAYS. 01/12/21  Yes Shade Flood, MD  doxycycline (VIBRA-TABS) 100  MG tablet Take 1 tablet (100 mg total) by mouth 2 (two) times daily. Patient not taking: No sig reported 12/30/20   Shade Flood, MD    Allergies    Metformin and related and Ace inhibitors  Review of Systems   Review of Systems  Unable to perform ROS: Severe respiratory distress    Physical Exam Updated Vital Signs BP (!) 142/63 (BP Location: Right Arm)   Pulse 80   Temp 98.9 F (37.2 C) (Oral)   Resp 17   SpO2 97%   Physical Exam Vitals and nursing note reviewed.  Constitutional:      General: She is in acute distress.     Appearance: She is ill-appearing.  HENT:     Head: Normocephalic.     Mouth/Throat:     Comments: Lips are cyanotic Eyes:     Conjunctiva/sclera: Conjunctivae normal.     Pupils: Pupils are equal, round, and reactive to light.  Neck:     Comments: No JVD Cardiovascular:     Rate and Rhythm: Normal rate and regular rhythm.     Pulses: Normal pulses.     Heart sounds: Normal heart sounds. No murmur heard. No friction rub. No gallop.      Comments: Peripheral pulses are 2+ and symmetric Pulmonary:     Effort: Respiratory distress present.     Comments: Lung sounds significantly diminished throughout.  CPAP mask in place.   Tachypneic. Abdominal:     General: There is distension.     Palpations: Abdomen is soft. There is no mass.     Tenderness: There is no abdominal tenderness. There is no right CVA tenderness, left CVA tenderness, guarding or rebound.     Hernia: No hernia is present.     Comments: Abdomen is distended, but soft.  Nontender.  Musculoskeletal:     Cervical back: Normal range of motion and neck supple.     Right lower leg: No edema.     Left lower leg: No edema.  Skin:    General: Skin is warm.     Findings: No rash.     Comments: Extremities are cool to the touch.  Minimal mottling noted to the lower legs bilaterally.   Neurological:     Mental Status: She is alert.     Comments: AxO x3.   Psychiatric:        Behavior: Behavior normal.     ED Results / Procedures / Treatments   Labs (all labs ordered are listed, but only abnormal results are displayed) Labs Reviewed  CBC WITH DIFFERENTIAL/PLATELET - Abnormal; Notable for the following components:      Result Value   WBC 17.8 (*)    RBC 5.34 (*)    Hemoglobin 15.7 (*)    HCT 49.4 (*)    Neutro Abs 9.3 (*)    Lymphs Abs 7.1 (*)    Basophils Absolute 0.2 (*)    All other components within normal limits  COMPREHENSIVE METABOLIC PANEL - Abnormal; Notable for the following components:   CO2 17 (*)    Glucose, Bld 236 (*)    Creatinine, Ser 1.29 (*)    Calcium 8.6 (*)    Albumin 3.2 (*)    GFR, Estimated 46 (*)    Anion gap 20 (*)    All other components within normal limits  MAGNESIUM - Abnormal; Notable for the following components:   Magnesium 5.6 (*)    All other components within normal limits  BRAIN NATRIURETIC PEPTIDE - Abnormal; Notable for the following components:   B Natriuretic Peptide 206.1 (*)    All other components within normal limits  MAGNESIUM - Abnormal; Notable for the following components:   Magnesium 2.5 (*)    All other components within normal limits  I-STAT ARTERIAL BLOOD GAS, ED - Abnormal;  Notable for the following components:   pH, Arterial 7.342 (*)    Potassium 3.2 (*)    Calcium, Ion 1.14 (*)    All other components within normal limits  TROPONIN I (HIGH SENSITIVITY) - Abnormal; Notable for the following components:   Troponin I (High Sensitivity) 28 (*)    All other components within normal limits  TROPONIN I (HIGH SENSITIVITY) - Abnormal; Notable for the following components:   Troponin I (High Sensitivity) 66 (*)    All other components within normal limits  RESP PANEL BY RT-PCR (FLU A&B, COVID) [id number]  PATHOLOGIST SMEAR REVIEW    EKG EKG Interpretation  Date/Time:  Monday January 20 2021 02:00:50 EST Ventricular Rate:  86 PR Interval:    QRS Duration: 153 QT Interval:  461 QTC Calculation: 552 R Axis:   60 Text Interpretation: Sinus rhythm LBBB noted on prior tracings in 2019 - does not meet sgarbossa criteria RBBB pattern noted Confirmed by Alvester Chou [phone] on 01/20/2021 2:04:29 AM   Radiology DG Chest Portable 1 View  Result Date: 01/20/2021 CLINICAL DATA:  Shortness of breath EXAM: PORTABLE CHEST 1 VIEW COMPARISON:  04/18/2020 FINDINGS: Mixed hazy and patchy opacities present in the mid to lower lungs, more focally coalescent in the right lung base. No pneumothorax or visible effusion the portions of the costophrenic sulci are collimated from view. Prominent cardiomediastinal contours though possibly accentuated by portable technique. Remaining cardiomediastinal contours are unremarkable. No acute osseous or soft tissue abnormality. Telemetry leads overlie the chest. IMPRESSION: Mixed hazy and patchy opacities in the mid to lower lungs, more focally coalescent in the right lung base, could acute infection/inflammation or asymmetric edema. Electronically Signed   By: Kreg Shropshire M.D.   On: 01/20/2021 01:54    Procedures .Critical Care Performed by: Barkley Boards, PA-C Authorized by: Barkley Boards, PA-C   Critical care provider statement:     Critical care time (minutes):  55   Critical care time was exclusive of:  Separately billable procedures and treating other patients and teaching time   Critical care was necessary to treat or prevent imminent or life-threatening deterioration of the following conditions:  Respiratory failure   Critical care was time spent personally by me on the following activities:  Ordering and performing treatments and interventions, ordering and review of laboratory studies, ordering and review of radiographic studies, pulse oximetry, re-evaluation of patient's condition, obtaining history from patient or surrogate, review of old charts, evaluation of patient's response to treatment, development of treatment plan with patient or surrogate and discussions with consultants   I assumed direction of critical care for this patient from another provider in my specialty: no       Medications Ordered in ED Medications  furosemide (LASIX) injection 20 mg (20 mg Intravenous Given 01/20/21 0202)  ondansetron (ZOFRAN) injection 4 mg (4 mg Intravenous Given 01/20/21 0142)  LORazepam (ATIVAN) injection 1 mg (1 mg Intravenous Given 01/20/21 0142)  cefTRIAXone (ROCEPHIN) 1 g in sodium chloride 0.9 % 100 mL IVPB (0 g Intravenous Stopped 01/20/21 0554)    ED Course  I have reviewed the triage vital signs and the nursing notes.  Pertinent labs & imaging results that were available during my care of the patient were reviewed by me and considered in my medical decision making (see chart for details).  Clinical Course as of 01/20/21 0626  Mon Jan 20, 2021  0145 Patient on bipap, breathing more comfortably.  Bedside U/S shows B lines in apices concerning for CHF exacerbation [MT]  0216 Repeat ECG stable.  Patient denies to me any active chest pain or pressure. [MT]  [phone] Received permission from patient to update son on care. Son is now at bedside and update on medical care provided.  [MM]  0336 Trop 28.  Mag elevated but she  received IV magnesium en route per EMS. [MT]  [id number] Patient reevaluation.  She is comfortable on BiPAP.  She verify that she has not recently taking in any corticosteroids.  She was treated with antibiotics for UTI within the last few weeks. [MM]  [phone] Patient to be admitted to hospitalist.  Delta trop with some mild elevation in setting of suspected CHF exacerbation. Rocephin ordered for leukocytosis, possible PNA noted on xray.  Less likely a PE without hypotension and given her rapid clinical improvement on bipap. [MT]    Clinical Course User Index [MM] Nautika Cressey A, PA-C [MT] Renaye Rakers, Kermit Balo, MD   MDM Rules/Calculators/A&P                          65 year old female with a h/o of DM Type II, HTN, HLD, CAD, GAD, and hypothyroidism who presents the emergency department by EMS from home in respiratory distress.  EMS reports patient was unresponsive on arrival, but after 10 minutes with bag valve mask, patient became alert and was able to be transition to CPAP.  The patient was seen and independently evaluated by myself and Dr. Renaye Rakers, attending physician, on arrival to the ER.  Hypertensive at 145/82.  She was tachypneic.  No hypoxia on CPAP.  Afebrile and no tachycardia.  She be given 125 mg of Solu-Medrol and 2 mg of magnesium in route.  Initial exam with significantly diminished lung sounds in all fields.  She is tachypneic.  Abdomen is distended, but soft.  No JVD is noted and no significant lower extremity edema.  Bedside ultrasound performed, which demonstrated B lines in the apices, concerning for CHF exacerbation.  Labs and imaging have been reviewed and independently interpreted by me.  Initial EKG with bifascicular block, but this appears unchanged from EKG from her cardiology appointment in May 2021.  There was also concern for ischemic changes in the inferior leads.  Dr. Geralynn Rile spoke with Dr. Julianne Handler, cardiology, who is less suspicious for ACS at this time, but will trend troponins.   Initial troponin 28 -->66.  Suspect this is secondary to demand from respiratory distress and less likely ACS.  Chest x-ray with hazy and patchy opacities in the mid to lower lungs, more focally coalescent in the right lung base that is concerning for acute infection or inflammation versus asymmetric edema.  BNP is elevated at 2 6.  No previous available for comparison.  Patient has been given 20 of IV Lasix.   On reevaluation, patient is tolerating BiPAP well.  She remains alert.  She initially has a mild respiratory acidosis with bicarb of 17.  Initial pH was 7.345.  We will continue to monitor.  There was concern for hypermagnesemia with initial magnesium of 5.6.  I spoke with the patient's RN who reports that labs  were pulled from the patient's IV where she had received Mg in route with EMS.  Suspect this is falsely elevated.  Repeat magnesium from straight stick was 2.5 mg.  She is already being treated with lasix for CHF exacerbation.  Patient also has a leukocytosis of 17.  Although she was given Solu-Medrol in route, she has had no other recent corticosteroid administration.  I did review the patient's chart and she had been seen by primary care within the last 2 weeks for URI symptoms.  COVID-19 test is negative.  This could be secondary to a mixed COPD exacerbation.  However, given leukocytosis, will cover for community-acquired pneumonia with Rocephin.  Given the patient is still requiring BiPAP, she will require admission for further work-up and evaluation.  Consult to the hospitalist team and Dr. Antionette Char will accept the patient for admission.  Final Clinical Impression(s) / ED Diagnoses Final diagnoses:  Acute on chronic congestive heart failure, unspecified heart failure type [facility])    Rx / DC Orders ED Discharge Orders    None       Barkley Boards, PA-C 01/20/21 0626    Terald Sleeper, MD 01/20/21 [phone]

## 2021-01-21 ENCOUNTER — Encounter (HOSPITAL_COMMUNITY): Payer: Self-pay | Admitting: Family Medicine

## 2021-01-21 DIAGNOSIS — F411 Generalized anxiety disorder: Secondary | ICD-10-CM

## 2021-01-21 DIAGNOSIS — I5043 Acute on chronic combined systolic (congestive) and diastolic (congestive) heart failure: Secondary | ICD-10-CM

## 2021-01-21 DIAGNOSIS — E039 Hypothyroidism, unspecified: Secondary | ICD-10-CM

## 2021-01-21 DIAGNOSIS — I10 Essential (primary) hypertension: Secondary | ICD-10-CM

## 2021-01-21 LAB — BASIC METABOLIC PANEL
Anion gap: 13 (ref 5–15)
Anion gap: 16 — ABNORMAL HIGH (ref 5–15)
BUN: 19 mg/dL (ref 8–23)
BUN: 21 mg/dL (ref 8–23)
CO2: 25 mmol/L (ref 22–32)
CO2: 24 mmol/L (ref 22–32)
Calcium: 9 mg/dL (ref 8.9–10.3)
Calcium: 9.7 mg/dL (ref 8.9–10.3)
Chloride: 98 mmol/L (ref 98–111)
Chloride: 100 mmol/L (ref 98–111)
Creatinine, Ser: 1.12 mg/dL — ABNORMAL HIGH (ref 0.44–1.00)
Creatinine, Ser: 1.29 mg/dL — ABNORMAL HIGH (ref 0.44–1.00)
GFR, Estimated: 54 mL/min — ABNORMAL LOW (ref >60)
GFR, Estimated: 46 mL/min — ABNORMAL LOW (ref >60)
Glucose, Bld: 158 mg/dL — ABNORMAL HIGH (ref 70–99)
Glucose, Bld: 271 mg/dL — ABNORMAL HIGH (ref 70–99)
Potassium: 4.2 mmol/L (ref 3.5–5.1)
Potassium: 5.3 mmol/L — ABNORMAL HIGH (ref 3.5–5.1)
Sodium: 136 mmol/L (ref 135–145)
Sodium: 140 mmol/L (ref 135–145)

## 2021-01-21 LAB — CBC
HCT: 43.9 % (ref 36.0–46.0)
Hemoglobin: 14.4 g/dL (ref 12.0–15.0)
MCH: 29 pg (ref 26.0–34.0)
MCHC: 32.8 g/dL (ref 30.0–36.0)
MCV: 88.5 fL (ref 80.0–100.0)
Platelets: 228 10*3/uL (ref 150–400)
RBC: 4.96 MIL/uL (ref 3.87–5.11)
RDW: 13.2 % (ref 11.5–15.5)
WBC: 20 10*3/uL — ABNORMAL HIGH (ref 4.0–10.5)
nRBC: 0 % (ref 0.0–0.2)

## 2021-01-21 LAB — CBG MONITORING, ED
Glucose-Capillary: 140 mg/dL — ABNORMAL HIGH (ref 70–99)
Glucose-Capillary: 208 mg/dL — ABNORMAL HIGH (ref 70–99)

## 2021-01-21 LAB — MAGNESIUM: Magnesium: 2 mg/dL (ref 1.7–2.4)

## 2021-01-21 LAB — GLUCOSE, CAPILLARY
Glucose-Capillary: 268 mg/dL — ABNORMAL HIGH (ref 70–99)
Glucose-Capillary: 238 mg/dL — ABNORMAL HIGH (ref 70–99)

## 2021-01-21 LAB — BRAIN NATRIURETIC PEPTIDE: B Natriuretic Peptide: 135.8 pg/mL — ABNORMAL HIGH (ref 0.0–100.0)

## 2021-01-21 MED ORDER — SACUBITRIL-VALSARTAN 49-51 MG PO TABS: 1.0000 | ORAL_TABLET | Freq: Two times a day (BID) | ORAL | Status: DC

## 2021-01-21 MED ORDER — SODIUM CHLORIDE 0.9% FLUSH
3.0000 mL | Freq: Two times a day (BID) | INTRAVENOUS | Status: DC
  Administered 2021-01-21 – 2021-01-22 (×2): 3 mL via INTRAVENOUS

## 2021-01-21 MED ORDER — SODIUM CHLORIDE 0.9 % WEIGHT BASED INFUSION
1.0000 mL/kg/h | INTRAVENOUS | Status: DC
  Administered 2021-01-22 (×2): 1 mL/kg/h via INTRAVENOUS

## 2021-01-21 MED ORDER — SODIUM CHLORIDE 0.9% FLUSH: 3.0000 mL | INTRAVENOUS | Status: DC | PRN

## 2021-01-21 MED ORDER — SACUBITRIL-VALSARTAN 49-51 MG PO TABS
1.0000 | ORAL_TABLET | Freq: Two times a day (BID) | ORAL | Status: DC
  Administered 2021-01-22 – 2021-01-24 (×4): 1 via ORAL
  Filled 2021-01-21 (×5): qty 1

## 2021-01-21 MED ORDER — SODIUM CHLORIDE 0.9 % IV SOLN: 250.0000 mL | INTRAVENOUS | Status: DC | PRN

## 2021-01-21 MED ORDER — SODIUM ZIRCONIUM CYCLOSILICATE 5 G PO PACK: 5.0000 g | PACK | Freq: Once | ORAL | Status: DC

## 2021-01-21 MED ORDER — FUROSEMIDE 10 MG/ML IJ SOLN: 40.0000 mg | Freq: Every day | INTRAMUSCULAR | Status: DC

## 2021-01-21 MED ORDER — SODIUM CHLORIDE 0.9 % WEIGHT BASED INFUSION
3.0000 mL/kg/h | INTRAVENOUS | Status: DC
  Administered 2021-01-22: 3 mL/kg/h via INTRAVENOUS

## 2021-01-21 MED ORDER — ASPIRIN 81 MG PO CHEW
81.0000 mg | CHEWABLE_TABLET | ORAL | Status: AC
  Administered 2021-01-22: 81 mg via ORAL
  Filled 2021-01-21: qty 1

## 2021-01-21 MED ORDER — METOPROLOL SUCCINATE ER 50 MG PO TB24
100.0000 mg | ORAL_TABLET | Freq: Every day | ORAL | Status: DC
  Administered 2021-01-22 – 2021-01-24 (×3): 100 mg via ORAL
  Filled 2021-01-21: qty 1
  Filled 2021-01-21: qty 2
  Filled 2021-01-21: qty 1

## 2021-01-21 NOTE — H&P (View-Only) (Signed)
Progress Note  Patient Name: Felicia Hanson Date of Encounter: 01/21/2021  Attending physician: Narda Bonds, MD Primary Physician:  Shade Flood, MD Outpatient Cardiologist: Tessa Lerner, DO, [facility] Inpatient Cardiologist: Tessa Lerner, DO, [facility] Referring physician: Dr. Jonah Blue   Subjective: Felicia Hanson is a 65 y.o. female who was seen and examined at bedside at approximately 8:30 AM in ER bed 43. No events overnight. Patient is less short of breath.  She continues to be anxious and tearful. Patient denies any chest pain at rest or with effort related activities.  Objective: Vital Signs in the last 24 hours: Temp:  [97.8 F (36.6 C)] 97.8 F (36.6 C) (02/22 1543) Pulse Rate:  [75-96] 86 (02/22 1543) Resp:  [15-22] 16 (02/22 1543) BP: (119-162)/(53-90) 157/88 (02/22 1543) SpO2:  [93 %-98 %] 97 % (02/22 1543) Weight:  [76 kg] 76 kg (02/22 1543)  Intake/Output: No intake or output data in the 24 hours ending 01/21/21 1827  Net IO Since Admission: -150 mL [01/21/21 1827]  Weights:  Filed Weights   01/21/21 1543  Weight: 76 kg    Telemetry: Personally reviewed.  Physical examination: PHYSICAL EXAM: Vitals with BMI 01/21/2021 01/21/2021 01/21/2021  Height 5\' 0"  - -  Weight 167 lbs 8 oz - -  BMI 32.71 - -  Systolic 157 139 206  Diastolic 88 66 90  Pulse 86 75 78    CONSTITUTIONAL: Appears older than stated age, hemodynamically stable, well-nourished. No acute distress.  SKIN: Skin is warm and dry. No cyanosis. No pallor. No jaundice HEAD: Normocephalic and atraumatic.  EYES: No scleral icterus MOUTH/THROAT: Moist oral membranes.  NECK: No JVD present. No thyromegaly noted. No carotid bruits  LYMPHATIC: No visible cervical adenopathy.  CHEST Normal respiratory effort. No intercostal retractions  LUNGS: Decreased breath sounds bilaterally.  Crackles noted bilaterally.  No stridor. No wheezes.   CARDIOVASCULAR: Regular, positive S1-S2, no murmurs rubs or  gallops appreciated. ABDOMINAL: Obese, soft, nontender, nondistended, positive bowel sounds in all 4 quadrants, no apparent ascites.  EXTREMITIES: Trace bilaterally peripheral edema, tender to touch. HEMATOLOGIC: No significant bruising NEUROLOGIC: Oriented to person, place, and time. Nonfocal. Normal muscle tone.  PSYCHIATRIC: Normal mood and affect. Normal behavior. Cooperative  Lab Results: Hematology Recent Labs  Lab 01/20/21 0145 01/20/21 0220 01/21/21 0916  WBC 17.8*  --  20.0*  RBC 5.34*  --  4.96  HGB 15.7* 13.9 14.4  HCT 49.4* 41.0 43.9  MCV 92.5  --  88.5  MCH 29.4  --  29.0  MCHC 31.8  --  32.8  RDW 12.9  --  13.2  PLT 234  --  228    Chemistry Recent Labs  Lab 01/20/21 0145 01/20/21 0220 01/21/21 0500  NA 135 137 136  K 3.5 3.2* 4.2  CL 98  --  98  CO2 17*  --  25  GLUCOSE 236*  --  158*  BUN 8  --  19  CREATININE 1.29*  --  1.12*  CALCIUM 8.6*  --  9.0  PROT 7.3  --   --   ALBUMIN 3.2*  --   --   AST 37  --   --   ALT 18  --   --   ALKPHOS 68  --   --   BILITOT 1.0  --   --   GFRNONAA 46*  --  54*  ANIONGAP 20*  --  13     Cardiac Enzymes: Cardiac Panel (last 3  results) Recent Labs    01/20/21 0145 01/20/21 0356 01/20/21 0651  TROPONINIHS 28* 66* 76*    BNP (last 3 results) Recent Labs    01/20/21 0159 01/21/21 0916  BNP 206.1* 135.8*    ProBNP (last 3 results) No results for input(s): PROBNP in the last 8760 hours.   DDimer No results for input(s): DDIMER in the last 168 hours.   Hemoglobin A1c:  Lab Results  Component Value Date   HGBA1C 6.8 (H) 12/06/2020   MPG 114 01/10/2014    TSH  Recent Labs    04/03/20 1757 09/06/20 1152 01/20/21 0918  TSH 0.732 1.160 0.294*    Lipid Panel     Component Value Date/Time   CHOL 153 12/06/2020 1638   TRIG 158 (H) 12/06/2020 1638   HDL 46 12/06/2020 1638   CHOLHDL 3.3 12/06/2020 1638   CHOLHDL 5.0 08/09/2015 1504   VLDL 44 (H) 08/09/2015 1504   LDLCALC 80 12/06/2020  1638    Imaging: DG Chest Portable 1 View  Result Date: 01/20/2021 CLINICAL DATA:  Shortness of breath EXAM: PORTABLE CHEST 1 VIEW COMPARISON:  04/18/2020 FINDINGS: Mixed hazy and patchy opacities present in the mid to lower lungs, more focally coalescent in the right lung base. No pneumothorax or visible effusion the portions of the costophrenic sulci are collimated from view. Prominent cardiomediastinal contours though possibly accentuated by portable technique. Remaining cardiomediastinal contours are unremarkable. No acute osseous or soft tissue abnormality. Telemetry leads overlie the chest. IMPRESSION: Mixed hazy and patchy opacities in the mid to lower lungs, more focally coalescent in the right lung base, could acute infection/inflammation or asymmetric edema. Electronically Signed   By: Kreg Shropshire M.D.   On: 01/20/2021 01:54   ECHOCARDIOGRAM COMPLETE  Result Date: 01/20/2021    ECHOCARDIOGRAM REPORT   Patient Name:   Felicia Hanson Date of Exam: 01/20/2021 Medical Rec #:  [id number]      Height:       60.0 in Accession #:    [id number]     Weight:       165.0 lb Date of Birth:  12/31/1955      BSA:          1.720 m Patient Age:    65 years       BP:           141/69 mmHg Patient Gender: F              HR:           81 bpm. Exam Location:  Inpatient Procedure: 2D Echo Indications:    acute diastolic chf 428.31  History:        Patient has prior history of Echocardiogram examinations, most                 recent 04/18/2015. CHF, CAD, Signs/Symptoms:Shortness of Breath                 and lower extremity edema; Risk Factors:Hypertension,                 Dyslipidemia and Diabetes.  Sonographer:    Delcie Roch Referring Phys: [id number] JENNIFER YATES IMPRESSIONS  1. Left ventricular ejection fraction, by estimation, is 35 to 40%. The left ventricle has moderately decreased function. There is mild left ventricular hypertrophy. Indeterminate diastolic filling due to E-A fusion.  2. Right ventricular  systolic function is normal. The right ventricular size is normal.  3. The mitral valve is abnormal.  Trivial mitral valve regurgitation.  4. The aortic valve is abnormal. Aortic valve regurgitation is not visualized. Mild to moderate aortic valve sclerosis/calcification is present, without any evidence of aortic stenosis. FINDINGS  Left Ventricle: LVEF is depressed with severe hypokinesis/akinesis of the inferior/inferoseptal walls. Left ventricular ejection fraction, by estimation, is 35 to 40%. The left ventricle has moderately decreased function. The left ventricular internal cavity size was normal in size. There is mild left ventricular hypertrophy. Indeterminate diastolic filling due to E-A fusion. Right Ventricle: The right ventricular size is normal. Right vetricular wall thickness was not assessed. Right ventricular systolic function is normal. Left Atrium: Left atrial size was normal in size. Right Atrium: Right atrial size was normal in size. Pericardium: Trivial pericardial effusion is present. Mitral Valve: The mitral valve is abnormal. There is mild thickening of the mitral valve leaflet(s). Mild mitral annular calcification. Trivial mitral valve regurgitation. Tricuspid Valve: The tricuspid valve is normal in structure. Tricuspid valve regurgitation is trivial. Aortic Valve: The aortic valve is abnormal. Aortic valve regurgitation is not visualized. Mild to moderate aortic valve sclerosis/calcification is present, without any evidence of aortic stenosis. Pulmonic Valve: The pulmonic valve was normal in structure. Pulmonic valve regurgitation is not visualized. IAS/Shunts: The interatrial septum was not assessed.  LEFT VENTRICLE PLAX 2D LVIDd:         5.10 cm LVIDs:         4.00 cm LV PW:         1.20 cm LV IVS:        1.20 cm LVOT diam:     2.10 cm LV SV:         61 LV SV Index:   36 LVOT Area:     3.46 cm  LV Volumes (MOD) LV vol d, MOD A4C: 94.3 ml LV vol s, MOD A4C: 65.7 ml LV SV MOD A4C:     94.3  ml RIGHT VENTRICLE             IVC RV S prime:     12.70 cm/s  IVC diam: 1.60 cm TAPSE (M-mode): 1.8 cm LEFT ATRIUM             Index       RIGHT ATRIUM           Index LA diam:        4.10 cm 2.38 cm/m  RA Area:     12.80 cm LA Vol (A2C):   55.7 ml 32.38 ml/m RA Volume:   27.90 ml  16.22 ml/m LA Vol (A4C):   43.5 ml 25.29 ml/m LA Biplane Vol: 54.1 ml 31.45 ml/m  AORTIC VALVE LVOT Vmax:   85.80 cm/s LVOT Vmean:  62.800 cm/s LVOT VTI:    0.177 m  AORTA Ao Root diam: 2.80 cm Ao Asc diam:  3.00 cm  SHUNTS Systemic VTI:  0.18 m Systemic Diam: 2.10 cm Paula Ross MD Electronically signed by Paula Ross MD Signature Date/Time: 01/20/2021/4:53:48 PM    Final     CARDIAC DATABASE: EKG: 01/20/2021: Normal sinus rhythm, 88 bpm, left axis deviation, biatrial enlargement, left bundle branch block.  Coronary calcium scoring 05/13/2015:LAD: 8, Cx: 2. Total calcium score is 10.   Echocardiogram: 03/2015: LVEF 44%, mild LVH, mild to moderate decrease in global wall motion, grade 2 diastolic impairment, mildly dilated left atrium, mild MR and TR.  04/23/2020: Left ventricle cavity is normal in size. Moderate concentric hypertrophy of the left ventricle. Moderate global hypokinesis. LVEF 35-40%. Indeterminate diastolic   function due to E/A fusion. Moderately dilated left atrium, mild MR.  01/20/2021:  1. Left ventricular ejection fraction, by estimation, is 35 to 40%. The left ventricle has moderately decreased function. There is mild left ventricular hypertrophy. Indeterminate diastolic filling due to E-A fusion.  2. Right ventricular systolic function is normal. The right ventricular size is normal.  3. The mitral valve is abnormal. Trivial mitral valve regurgitation.  4. The aortic valve is abnormal. Aortic valve regurgitation is not visualized. Mild to moderate aortic valve sclerosis/calcification is present, without any evidence of aortic stenosis.   Stress Testing: Lexiscan Tetrofosmin Stress Test  05/13/2020:  There is a fixed mild defect in the inferior region due to diaphragmatic attenuation. No ischemia or scar.  The LV is moderately dilated with LV end diastolic volume of 173 mL.  Overall LV systolic function is abnormal without regional wall motion abnormalities. Stress LV EF: 21%.  High risk study due to low LVEF. Findings suggest non ischemic dilated cardiomyopathy.  Heart Catheterization: None   Scheduled Meds: . aspirin EC  81 mg Oral Daily  . docusate sodium  100 mg Oral BID  . DULoxetine  120 mg Oral Daily  . enoxaparin (LOVENOX) injection  40 mg Subcutaneous Q24H  . [START ON 01/22/2021] furosemide  40 mg Intravenous Daily  . insulin aspart  0-15 Units Subcutaneous TID WC  . insulin aspart  0-5 Units Subcutaneous QHS  . insulin glargine  38 Units Subcutaneous Daily  . levothyroxine  75 mcg Oral Q0600  . [START ON 01/22/2021] metoprolol succinate  100 mg Oral Daily  . metoprolol tartrate  50 mg Oral BID  . nicotine  14 mg Transdermal Daily  . potassium chloride  40 mEq Oral Daily  . pravastatin  80 mg Oral QPM  . [START ON 01/22/2021] sacubitril-valsartan  1 tablet Oral BID  . spironolactone  25 mg Oral Daily    Continuous Infusions:   PRN Meds: acetaminophen **OR** acetaminophen, albuterol, ALPRAZolam, bisacodyl, morphine injection, ondansetron **OR** ondansetron (ZOFRAN) IV, oxyCODONE, polyethylene glycol   IMPRESSION & RECOMMENDATIONS: Felicia Hanson is a 65 y.o. female whose past medical history and cardiac risk factors include:  insulin-dependent type 2 diabetes with hyperglycemia, pure hypercholesterolemia, hypertriglyceridemia, coronary artery calcification, newly diagnosed left bundle branch block, essential hypertension, hypothyroidism, tobacco use disorder, vitamin D deficiency, generalized anxiety disorder.  Impression: Acute combined systolic and diastolic heart failure, stage C, NYHA class II/III: improving  Acute respiratory distress requiring  BiPAP intervention: Resolved  Elevated troponins most likely secondary to supply demand ischemia in the setting of acute respiratory distress and heart failure exacerbation.  Calcified atherosclerotic coronary artery disease.  Left bundle branch block.  Insulin-dependent diabetes mellitus type 2  Benign essential hypertension with chronic kidney disease stage III  Hypertriglyceridemia.  Active tobacco use  Plan: Patient presented to hospital with acute respiratory distress requiring BiPAP intervention.  In the last 24 hours patient's shortness of breath has improved significantly and has not required CPAP/BiPAP.  Patient has been diuresed well and BNP has improved and so is her symptoms.  Strict I's and O's are not documented correctly as she remains in the ER.  Morning labs reviewed.  We will decrease the Lasix from twice a day IV to once a day.  Will discontinue losartan and initiate Entresto.  I will transition her from Lopressor to Toprol-XL as of 01/22/2021.  Echocardiogram results reviewed.  Patient continues to have moderately reduced left ventricular systolic function without mention of regional wall  motion abnormalities.  Based on the last nuclear stress test the findings are suggestive of nonischemic cardiomyopathy but due to her symptoms of precordial pain suggestive of possible cardiac etiology we discussed undergoing left and right heart catheterization.  Patient is agreeable.  We will tentatively schedule it for tomorrow 01/22/2021.  The procedure of left and right heart catheterization with possible intervention was explained to the patient and her son in detail. The indication, alternatives, risks and benefits were reviewed.  Complications include but not limited to bleeding, infection, vascular injury, stroke, myocardial infection, arrhythmia, kidney injury requiring hemodialysis, radiation-related injury in the case of prolonged fluoroscopy use, emergency cardiac  surgery, need of pacemaker,  and death. The patient understands the risks of serious complication is 1-2 in 1000 with diagnostic cardiac cath and 1-2% or less with angioplasty/stenting. The patient and her son voices understanding and provides verbal feedback and wishes to proceed with coronary angiography with possible PCI.  Continue aspirin and statin therapy given the underlying coronary artery calcification.  Calculated LDL 75 mg/dL.  Patient is currently on maximally tolerated statin therapy.  We will add Zetia 10 mg p.o. daily goal LDL less than 70 mg/dL in a patient with underlying insulin-dependent diabetes mellitus.  Patient educated on the importance of complete smoking cessation.  Continue your care regarding her chronic comorbid conditions.  Patient's questions and concerns were addressed to her satisfaction. She voices understanding of the instructions provided during this encounter.   This note was created using a voice recognition software as a result there may be grammatical errors inadvertently enclosed that do not reflect the nature of this encounter. Every attempt is made to correct such errors.  Tessa Lerner, DO, [facility] Office: [phone] 01/21/2021, 6:27 PM

## 2021-01-21 NOTE — ED Notes (Signed)
Patient up walking to bathroom, gait steady, O2 sats 95% on RA, no resp distress noted.

## 2021-01-21 NOTE — Progress Notes (Signed)
Progress Note  Patient Name: Felicia Hanson Date of Encounter: 01/21/2021  Attending physician: Narda Bonds, MD Primary Physician:  Shade Flood, MD Outpatient Cardiologist: Tessa Lerner, DO, [facility] Inpatient Cardiologist: Tessa Lerner, DO, [facility] Referring physician: Dr. Jonah Blue   Subjective: Felicia Hanson is a 65 y.o. female who was seen and examined at bedside at approximately 8:30 AM in ER bed 43. No events overnight. Patient is less short of breath.  She continues to be anxious and tearful. Patient denies any chest pain at rest or with effort related activities.  Objective: Vital Signs in the last 24 hours: Temp:  [97.8 F (36.6 C)] 97.8 F (36.6 C) (02/22 1543) Pulse Rate:  [75-96] 86 (02/22 1543) Resp:  [15-22] 16 (02/22 1543) BP: (119-162)/(53-90) 157/88 (02/22 1543) SpO2:  [93 %-98 %] 97 % (02/22 1543) Weight:  [76 kg] 76 kg (02/22 1543)  Intake/Output: No intake or output data in the 24 hours ending 01/21/21 1827  Net IO Since Admission: -150 mL [01/21/21 1827]  Weights:  Filed Weights   01/21/21 1543  Weight: 76 kg    Telemetry: Personally reviewed.  Physical examination: PHYSICAL EXAM: Vitals with BMI 01/21/2021 01/21/2021 01/21/2021  Height 5\' 0"  - -  Weight 167 lbs 8 oz - -  BMI 32.71 - -  Systolic 157 139 206  Diastolic 88 66 90  Pulse 86 75 78    CONSTITUTIONAL: Appears older than stated age, hemodynamically stable, well-nourished. No acute distress.  SKIN: Skin is warm and dry. No cyanosis. No pallor. No jaundice HEAD: Normocephalic and atraumatic.  EYES: No scleral icterus MOUTH/THROAT: Moist oral membranes.  NECK: No JVD present. No thyromegaly noted. No carotid bruits  LYMPHATIC: No visible cervical adenopathy.  CHEST Normal respiratory effort. No intercostal retractions  LUNGS: Decreased breath sounds bilaterally.  Crackles noted bilaterally.  No stridor. No wheezes.   CARDIOVASCULAR: Regular, positive S1-S2, no murmurs rubs or  gallops appreciated. ABDOMINAL: Obese, soft, nontender, nondistended, positive bowel sounds in all 4 quadrants, no apparent ascites.  EXTREMITIES: Trace bilaterally peripheral edema, tender to touch. HEMATOLOGIC: No significant bruising NEUROLOGIC: Oriented to person, place, and time. Nonfocal. Normal muscle tone.  PSYCHIATRIC: Normal mood and affect. Normal behavior. Cooperative  Lab Results: Hematology Recent Labs  Lab 01/20/21 0145 01/20/21 0220 01/21/21 0916  WBC 17.8*  --  20.0*  RBC 5.34*  --  4.96  HGB 15.7* 13.9 14.4  HCT 49.4* 41.0 43.9  MCV 92.5  --  88.5  MCH 29.4  --  29.0  MCHC 31.8  --  32.8  RDW 12.9  --  13.2  PLT 234  --  228    Chemistry Recent Labs  Lab 01/20/21 0145 01/20/21 0220 01/21/21 0500  NA 135 137 136  K 3.5 3.2* 4.2  CL 98  --  98  CO2 17*  --  25  GLUCOSE 236*  --  158*  BUN 8  --  19  CREATININE 1.29*  --  1.12*  CALCIUM 8.6*  --  9.0  PROT 7.3  --   --   ALBUMIN 3.2*  --   --   AST 37  --   --   ALT 18  --   --   ALKPHOS 68  --   --   BILITOT 1.0  --   --   GFRNONAA 46*  --  54*  ANIONGAP 20*  --  13     Cardiac Enzymes: Cardiac Panel (last 3  results) Recent Labs    01/20/21 0145 01/20/21 0356 01/20/21 0651  TROPONINIHS 28* 66* 76*    BNP (last 3 results) Recent Labs    01/20/21 0159 01/21/21 0916  BNP 206.1* 135.8*    ProBNP (last 3 results) No results for input(s): PROBNP in the last 8760 hours.   DDimer No results for input(s): DDIMER in the last 168 hours.   Hemoglobin A1c:  Lab Results  Component Value Date   HGBA1C 6.8 (H) 12/06/2020   MPG 114 01/10/2014    TSH  Recent Labs    04/03/20 1757 09/06/20 1152 01/20/21 0918  TSH 0.732 1.160 0.294*    Lipid Panel     Component Value Date/Time   CHOL 153 12/06/2020 1638   TRIG 158 (H) 12/06/2020 1638   HDL 46 12/06/2020 1638   CHOLHDL 3.3 12/06/2020 1638   CHOLHDL 5.0 08/09/2015 1504   VLDL 44 (H) 08/09/2015 1504   LDLCALC 80 12/06/2020  1638    Imaging: DG Chest Portable 1 View  Result Date: 01/20/2021 CLINICAL DATA:  Shortness of breath EXAM: PORTABLE CHEST 1 VIEW COMPARISON:  04/18/2020 FINDINGS: Mixed hazy and patchy opacities present in the mid to lower lungs, more focally coalescent in the right lung base. No pneumothorax or visible effusion the portions of the costophrenic sulci are collimated from view. Prominent cardiomediastinal contours though possibly accentuated by portable technique. Remaining cardiomediastinal contours are unremarkable. No acute osseous or soft tissue abnormality. Telemetry leads overlie the chest. IMPRESSION: Mixed hazy and patchy opacities in the mid to lower lungs, more focally coalescent in the right lung base, could acute infection/inflammation or asymmetric edema. Electronically Signed   By: Kreg Shropshire M.D.   On: 01/20/2021 01:54   ECHOCARDIOGRAM COMPLETE  Result Date: 01/20/2021    ECHOCARDIOGRAM REPORT   Patient Name:   Felicia Hanson Date of Exam: 01/20/2021 Medical Rec #:  [id number]      Height:       60.0 in Accession #:    [id number]     Weight:       165.0 lb Date of Birth:  12/31/1955      BSA:          1.720 m Patient Age:    65 years       BP:           141/69 mmHg Patient Gender: F              HR:           81 bpm. Exam Location:  Inpatient Procedure: 2D Echo Indications:    acute diastolic chf 428.31  History:        Patient has prior history of Echocardiogram examinations, most                 recent 04/18/2015. CHF, CAD, Signs/Symptoms:Shortness of Breath                 and lower extremity edema; Risk Factors:Hypertension,                 Dyslipidemia and Diabetes.  Sonographer:    Delcie Roch Referring Phys: [id number] JENNIFER YATES IMPRESSIONS  1. Left ventricular ejection fraction, by estimation, is 35 to 40%. The left ventricle has moderately decreased function. There is mild left ventricular hypertrophy. Indeterminate diastolic filling due to E-A fusion.  2. Right ventricular  systolic function is normal. The right ventricular size is normal.  3. The mitral valve is abnormal.  Trivial mitral valve regurgitation.  4. The aortic valve is abnormal. Aortic valve regurgitation is not visualized. Mild to moderate aortic valve sclerosis/calcification is present, without any evidence of aortic stenosis. FINDINGS  Left Ventricle: LVEF is depressed with severe hypokinesis/akinesis of the inferior/inferoseptal walls. Left ventricular ejection fraction, by estimation, is 35 to 40%. The left ventricle has moderately decreased function. The left ventricular internal cavity size was normal in size. There is mild left ventricular hypertrophy. Indeterminate diastolic filling due to E-A fusion. Right Ventricle: The right ventricular size is normal. Right vetricular wall thickness was not assessed. Right ventricular systolic function is normal. Left Atrium: Left atrial size was normal in size. Right Atrium: Right atrial size was normal in size. Pericardium: Trivial pericardial effusion is present. Mitral Valve: The mitral valve is abnormal. There is mild thickening of the mitral valve leaflet(s). Mild mitral annular calcification. Trivial mitral valve regurgitation. Tricuspid Valve: The tricuspid valve is normal in structure. Tricuspid valve regurgitation is trivial. Aortic Valve: The aortic valve is abnormal. Aortic valve regurgitation is not visualized. Mild to moderate aortic valve sclerosis/calcification is present, without any evidence of aortic stenosis. Pulmonic Valve: The pulmonic valve was normal in structure. Pulmonic valve regurgitation is not visualized. IAS/Shunts: The interatrial septum was not assessed.  LEFT VENTRICLE PLAX 2D LVIDd:         5.10 cm LVIDs:         4.00 cm LV PW:         1.20 cm LV IVS:        1.20 cm LVOT diam:     2.10 cm LV SV:         61 LV SV Index:   36 LVOT Area:     3.46 cm  LV Volumes (MOD) LV vol d, MOD A4C: 94.3 ml LV vol s, MOD A4C: 65.7 ml LV SV MOD A4C:     94.3  ml RIGHT VENTRICLE             IVC RV S prime:     12.70 cm/s  IVC diam: 1.60 cm TAPSE (M-mode): 1.8 cm LEFT ATRIUM             Index       RIGHT ATRIUM           Index LA diam:        4.10 cm 2.38 cm/m  RA Area:     12.80 cm LA Vol (A2C):   55.7 ml 32.38 ml/m RA Volume:   27.90 ml  16.22 ml/m LA Vol (A4C):   43.5 ml 25.29 ml/m LA Biplane Vol: 54.1 ml 31.45 ml/m  AORTIC VALVE LVOT Vmax:   85.80 cm/s LVOT Vmean:  62.800 cm/s LVOT VTI:    0.177 m  AORTA Ao Root diam: 2.80 cm Ao Asc diam:  3.00 cm  SHUNTS Systemic VTI:  0.18 m Systemic Diam: 2.10 cm Dietrich PatesPaula Ross MD Electronically signed by Dietrich PatesPaula Ross MD Signature Date/Time: 01/20/2021/4:53:48 PM    Final     CARDIAC DATABASE: EKG: 01/20/2021: Normal sinus rhythm, 88 bpm, left axis deviation, biatrial enlargement, left bundle branch block.  Coronary calcium scoring 05/13/2015:LAD: 8, Cx: 2. Total calcium score is 10.   Echocardiogram: 03/2015: LVEF 44%, mild LVH, mild to moderate decrease in global wall motion, grade 2 diastolic impairment, mildly dilated left atrium, mild MR and TR.  04/23/2020: Left ventricle cavity is normal in size. Moderate concentric hypertrophy of the left ventricle. Moderate global hypokinesis. LVEF 35-40%. Indeterminate diastolic  function due to E/A fusion. Moderately dilated left atrium, mild MR.  01/20/2021:  1. Left ventricular ejection fraction, by estimation, is 35 to 40%. The left ventricle has moderately decreased function. There is mild left ventricular hypertrophy. Indeterminate diastolic filling due to E-A fusion.  2. Right ventricular systolic function is normal. The right ventricular size is normal.  3. The mitral valve is abnormal. Trivial mitral valve regurgitation.  4. The aortic valve is abnormal. Aortic valve regurgitation is not visualized. Mild to moderate aortic valve sclerosis/calcification is present, without any evidence of aortic stenosis.   Stress Testing: Lexiscan Tetrofosmin Stress Test  05/13/2020:  There is a fixed mild defect in the inferior region due to diaphragmatic attenuation. No ischemia or scar.  The LV is moderately dilated with LV end diastolic volume of 173 mL.  Overall LV systolic function is abnormal without regional wall motion abnormalities. Stress LV EF: 21%.  High risk study due to low LVEF. Findings suggest non ischemic dilated cardiomyopathy.  Heart Catheterization: None   Scheduled Meds: . aspirin EC  81 mg Oral Daily  . docusate sodium  100 mg Oral BID  . DULoxetine  120 mg Oral Daily  . enoxaparin (LOVENOX) injection  40 mg Subcutaneous Q24H  . [START ON 01/22/2021] furosemide  40 mg Intravenous Daily  . insulin aspart  0-15 Units Subcutaneous TID WC  . insulin aspart  0-5 Units Subcutaneous QHS  . insulin glargine  38 Units Subcutaneous Daily  . levothyroxine  75 mcg Oral Q0600  . [START ON 01/22/2021] metoprolol succinate  100 mg Oral Daily  . metoprolol tartrate  50 mg Oral BID  . nicotine  14 mg Transdermal Daily  . potassium chloride  40 mEq Oral Daily  . pravastatin  80 mg Oral QPM  . [START ON 01/22/2021] sacubitril-valsartan  1 tablet Oral BID  . spironolactone  25 mg Oral Daily    Continuous Infusions:   PRN Meds: acetaminophen **OR** acetaminophen, albuterol, ALPRAZolam, bisacodyl, morphine injection, ondansetron **OR** ondansetron (ZOFRAN) IV, oxyCODONE, polyethylene glycol   IMPRESSION & RECOMMENDATIONS: JOLIET MALLOZZI is a 65 y.o. female whose past medical history and cardiac risk factors include:  insulin-dependent type 2 diabetes with hyperglycemia, pure hypercholesterolemia, hypertriglyceridemia, coronary artery calcification, newly diagnosed left bundle branch block, essential hypertension, hypothyroidism, tobacco use disorder, vitamin D deficiency, generalized anxiety disorder.  Impression: Acute combined systolic and diastolic heart failure, stage C, NYHA class II/III: improving  Acute respiratory distress requiring  BiPAP intervention: Resolved  Elevated troponins most likely secondary to supply demand ischemia in the setting of acute respiratory distress and heart failure exacerbation.  Calcified atherosclerotic coronary artery disease.  Left bundle branch block.  Insulin-dependent diabetes mellitus type 2  Benign essential hypertension with chronic kidney disease stage III  Hypertriglyceridemia.  Active tobacco use  Plan: Patient presented to hospital with acute respiratory distress requiring BiPAP intervention.  In the last 24 hours patient's shortness of breath has improved significantly and has not required CPAP/BiPAP.  Patient has been diuresed well and BNP has improved and so is her symptoms.  Strict I's and O's are not documented correctly as she remains in the ER.  Morning labs reviewed.  We will decrease the Lasix from twice a day IV to once a day.  Will discontinue losartan and initiate Entresto.  I will transition her from Lopressor to Toprol-XL as of 01/22/2021.  Echocardiogram results reviewed.  Patient continues to have moderately reduced left ventricular systolic function without mention of regional wall  motion abnormalities.  Based on the last nuclear stress test the findings are suggestive of nonischemic cardiomyopathy but due to her symptoms of precordial pain suggestive of possible cardiac etiology we discussed undergoing left and right heart catheterization.  Patient is agreeable.  We will tentatively schedule it for tomorrow 01/22/2021.  The procedure of left and right heart catheterization with possible intervention was explained to the patient and her son in detail. The indication, alternatives, risks and benefits were reviewed.  Complications include but not limited to bleeding, infection, vascular injury, stroke, myocardial infection, arrhythmia, kidney injury requiring hemodialysis, radiation-related injury in the case of prolonged fluoroscopy use, emergency cardiac  surgery, need of pacemaker,  and death. The patient understands the risks of serious complication is 1-2 in 1000 with diagnostic cardiac cath and 1-2% or less with angioplasty/stenting. The patient and her son voices understanding and provides verbal feedback and wishes to proceed with coronary angiography with possible PCI.  Continue aspirin and statin therapy given the underlying coronary artery calcification.  Calculated LDL 75 mg/dL.  Patient is currently on maximally tolerated statin therapy.  We will add Zetia 10 mg p.o. daily goal LDL less than 70 mg/dL in a patient with underlying insulin-dependent diabetes mellitus.  Patient educated on the importance of complete smoking cessation.  Continue your care regarding her chronic comorbid conditions.  Patient's questions and concerns were addressed to her satisfaction. She voices understanding of the instructions provided during this encounter.   This note was created using a voice recognition software as a result there may be grammatical errors inadvertently enclosed that do not reflect the nature of this encounter. Every attempt is made to correct such errors.  Tessa Lerner, DO, [facility] Office: [phone] 01/21/2021, 6:27 PM

## 2021-01-21 NOTE — Progress Notes (Addendum)
PROGRESS NOTE    Felicia Hanson  XEN:[id number] DOB: [birth year] DOA: 01/20/2021 PCP: Shade Flood, MD   Brief Narrative: Felicia Hanson is a 65 y.o. female with a history of hypothyroidism, hypertension, hyperlipidemia, diabetes mellitus, CAD, depression/anxiety. Patient presented secondary to respiratory distress and found to have associated hypoxia requiring the use of BiPAP to stabilize in setting of acute on chronic heart failure. Patient was treated with IV lasix with improvement.   Assessment & Plan:   Principal Problem:   Respiratory distress Active Problems:   Generalized anxiety disorder   Essential hypertension, benign   Pure hypercholesterolemia   Hypothyroidism   Type 2 diabetes mellitus without complication, without long-term current use of insulin (HCC)   Acute on chronic combined systolic and diastolic CHF (congestive heart failure) (HCC)   Acute respiratory distress Acute respiratory failure with hypoxia Chest x-ray concerning for infection/inflammation vs asymmetric edema. Probably latter with significant improvement of clinical status. Currently on room air. Resolved.  Acute on chronic combined systolic and diastolic heart failure Possibly secondary to stress per patient history. Transthoracic Echocardiogram on admission significant for an EF of 35-40% with indeterminate diastolic filling. Cardiology consulted on admission and plan for heart catheterization this admission -Cardiology recommendations: Heart catheterization tomorrow, Lasix, Spironolactone, pending today  Primary hypertension Patient is on Cozaar and metoprolol as an outpatient. BP is mostly controlled/slightly uncontrolled overnight. Losartan resumed this morning -Continue Losartan 50 mg daily and metoprolol 50 mg BID -Started on spironolactone per cardiology in addition to Lasix for above  Hyperlipidemia -Continue pravastatin  Diabetes mellitus, type 2 Hemoglobin A1C of 6.8%. patient  is on Trulicity and Lantus as an outpatient -Continue Lantus and SSI  Hypothyroidism Synthroid increased to 88 mcg on admission secondary to an admission TSH of 0.294.  Depression/Anxiety -Continue Xanax, Cymbalta  Leukocytosis Likely from solumedrol received via EMS. Baseline WBC of 11-12. No obvious evidence of infection. Unlikely pneumonia with such quick improvement of respiratory status. -CBC in AM   DVT prophylaxis: Lovenox Code Status:   Code Status: DNR Family Communication: None at bedside Disposition Plan: Discharge in 1-2 days home pending cardiology recommendations/management   Consultants:   Cardiology  Procedures:   TRANSTHORACIC ECHOCARDIOGRAM (01/20/2021) IMPRESSIONS    1. Left ventricular ejection fraction, by estimation, is 35 to 40%. The  left ventricle has moderately decreased function. There is mild left  ventricular hypertrophy. Indeterminate diastolic filling due to E-A  fusion.  2. Right ventricular systolic function is normal. The right ventricular  size is normal.  3. The mitral valve is abnormal. Trivial mitral valve regurgitation.  4. The aortic valve is abnormal. Aortic valve regurgitation is not  visualized. Mild to moderate aortic valve sclerosis/calcification is  present, without any evidence of aortic stenosis.  Antimicrobials:  None    Subjective: No dyspnea or chest pain today. Feels much better.  Objective: Vitals:   01/21/21 0200 01/21/21 0230 01/21/21 0530 01/21/21 0815  BP: (!) 145/77 (!) 119/55 (!) 127/53 (!) 147/66  Pulse: 89 79 84 86  Resp: (!) 21 18 19 17   Temp:    97.8 F (36.6 C)  TempSrc:    Oral  SpO2: 98% 94% 93% 97%    Intake/Output Summary (Last 24 hours) at 01/21/2021 0949 Last data filed at 01/20/2021 1332 Gross per 24 hour  Intake --  Output 250 ml  Net -250 ml   There were no vitals filed for this visit.  Examination:  General exam: Appears calm and comfortable Respiratory  system: Clear to  auscultation except for faint rales at bases. Respiratory effort normal. Cardiovascular system: S1 & S2 heard, RRR. No murmurs, rubs, gallops or clicks. Gastrointestinal system: Abdomen is nondistended, soft and nontender. No organomegaly or masses felt. Normal bowel sounds heard. Central nervous system: Alert and oriented. No focal neurological deficits. Musculoskeletal: No edema. No calf tenderness Skin: No cyanosis. No rashes Psychiatry: Judgement and insight appear normal. Mood & affect appropriate.     Data Reviewed: I have personally reviewed following labs and imaging studies  CBC Lab Results  Component Value Date   WBC 17.8 (H) 01/20/2021   RBC 5.34 (H) 01/20/2021   HGB 13.9 01/20/2021   HCT 41.0 01/20/2021   MCV 92.5 01/20/2021   MCH 29.4 01/20/2021   PLT 234 01/20/2021   MCHC 31.8 01/20/2021   RDW 12.9 01/20/2021   LYMPHSABS 7.1 (H) 01/20/2021   MONOABS 0.7 01/20/2021   EOSABS 0.5 01/20/2021   BASOSABS 0.2 (H) 01/20/2021     Last metabolic panel Lab Results  Component Value Date   NA 137 01/20/2021   K 3.2 (L) 01/20/2021   CL 98 01/20/2021   CO2 17 (L) 01/20/2021   BUN 8 01/20/2021   CREATININE 1.29 (H) 01/20/2021   GLUCOSE 236 (H) 01/20/2021   GFRNONAA 46 (L) 01/20/2021   GFRAA 78 12/06/2020   CALCIUM 8.6 (L) 01/20/2021   PROT 7.3 01/20/2021   ALBUMIN 3.2 (L) 01/20/2021   LABGLOB 3.5 12/06/2020   AGRATIO 1.1 (L) 12/06/2020   BILITOT 1.0 01/20/2021   ALKPHOS 68 01/20/2021   AST 37 01/20/2021   ALT 18 01/20/2021   ANIONGAP 20 (H) 01/20/2021    CBG (last 3)  Recent Labs    01/20/21 1740 01/20/21 2107 01/21/21 0733  GLUCAP 177* 275* 140*     GFR: CrCl cannot be calculated (Unknown ideal weight.).  Coagulation Profile: No results for input(s): INR, PROTIME in the last 168 hours.  Recent Results (from the past 240 hour(s))  Resp Panel by RT-PCR (Flu A&B, Covid) Nasopharyngeal Swab     Status: None   Collection Time: 01/20/21  2:05 AM    Specimen: Nasopharyngeal Swab; Nasopharyngeal(NP) swabs in vial transport medium  Result Value Ref Range Status   SARS Coronavirus 2 by RT PCR NEGATIVE NEGATIVE Final    Comment: (NOTE) SARS-CoV-2 target nucleic acids are NOT DETECTED.  The SARS-CoV-2 RNA is generally detectable in upper respiratory specimens during the acute phase of infection. The lowest concentration of SARS-CoV-2 viral copies this assay can detect is 138 copies/mL. A negative result does not preclude SARS-Cov-2 infection and should not be used as the sole basis for treatment or other patient management decisions. A negative result may occur with  improper specimen collection/handling, submission of specimen other than nasopharyngeal swab, presence of viral mutation(s) within the areas targeted by this assay, and inadequate number of viral copies(<138 copies/mL). A negative result must be combined with clinical observations, patient history, and epidemiological information. The expected result is Negative.  Fact Sheet for Patients:  BloggerCourse.com  Fact Sheet for Healthcare Providers:  SeriousBroker.it  This test is no t yet approved or cleared by the [place] FDA and  has been authorized for detection and/or diagnosis of SARS-CoV-2 by FDA under an Emergency Use Authorization (EUA). This EUA will remain  in effect (meaning this test can be used) for the duration of the COVID-19 declaration under Section 564(b)(1) of the Act, 21 U.S.C.section 360bbb-3(b)(1), unless the authorization is terminated  or  revoked sooner.       Influenza A by PCR NEGATIVE NEGATIVE Final   Influenza B by PCR NEGATIVE NEGATIVE Final    Comment: (NOTE) The Xpert Xpress SARS-CoV-2/FLU/RSV plus assay is intended as an aid in the diagnosis of influenza from Nasopharyngeal swab specimens and should not be used as a sole basis for treatment. Nasal washings and aspirates are  unacceptable for Xpert Xpress SARS-CoV-2/FLU/RSV testing.  Fact Sheet for Patients: BloggerCourse.comhttps://www.fda.gov/media/152166/download  Fact Sheet for Healthcare Providers: SeriousBroker.ithttps://www.fda.gov/media/152162/download  This test is not yet approved or cleared by the [place] FDA and has been authorized for detection and/or diagnosis of SARS-CoV-2 by FDA under an Emergency Use Authorization (EUA). This EUA will remain in effect (meaning this test can be used) for the duration of the COVID-19 declaration under Section 564(b)(1) of the Act, 21 U.S.C. section 360bbb-3(b)(1), unless the authorization is terminated or revoked.  Performed at [facility], [address] [place], [place] [address]         Radiology Studies: DG Chest Portable 1 View  Result Date: 01/20/2021 CLINICAL DATA:  Shortness of breath EXAM: PORTABLE CHEST 1 VIEW COMPARISON:  04/18/2020 FINDINGS: Mixed hazy and patchy opacities present in the mid to lower lungs, more focally coalescent in the right lung base. No pneumothorax or visible effusion the portions of the costophrenic sulci are collimated from view. Prominent cardiomediastinal contours though possibly accentuated by portable technique. Remaining cardiomediastinal contours are unremarkable. No acute osseous or soft tissue abnormality. Telemetry leads overlie the chest. IMPRESSION: Mixed hazy and patchy opacities in the mid to lower lungs, more focally coalescent in the right lung base, could acute infection/inflammation or asymmetric edema. Electronically Signed   By: Kreg ShropshirePrice  DeHay M.D.   On: 01/20/2021 01:54   ECHOCARDIOGRAM COMPLETE  Result Date: 01/20/2021    ECHOCARDIOGRAM REPORT   Patient Name:   Felicia Hanson Date of Exam: 01/20/2021 Medical Rec #:  [id number]      Height:       60.0 in Accession #:    [id number]     Weight:       165.0 lb Date of Birth:  30-Mar-1956      BSA:          1.720 m Patient Age:    65 years       BP:           141/69 mmHg Patient  Gender: F              HR:           81 bpm. Exam Location:  Inpatient Procedure: 2D Echo Indications:    acute diastolic chf 428.31  History:        Patient has prior history of Echocardiogram examinations, most                 recent 04/18/2015. CHF, CAD, Signs/Symptoms:Shortness of Breath                 and lower extremity edema; Risk Factors:Hypertension,                 Dyslipidemia and Diabetes.  Sonographer:    Delcie RochLauren Pennington Referring Phys: [id number] JENNIFER YATES IMPRESSIONS  1. Left ventricular ejection fraction, by estimation, is 35 to 40%. The left ventricle has moderately decreased function. There is mild left ventricular hypertrophy. Indeterminate diastolic filling due to E-A fusion.  2. Right ventricular systolic function is normal. The right ventricular size is normal.  3. The mitral valve is abnormal. Trivial mitral valve regurgitation.  4. The aortic valve is abnormal. Aortic valve regurgitation is not visualized. Mild to moderate aortic valve sclerosis/calcification is present, without any evidence of aortic stenosis. FINDINGS  Left Ventricle: LVEF is depressed with severe hypokinesis/akinesis of the inferior/inferoseptal walls. Left ventricular ejection fraction, by estimation, is 35 to 40%. The left ventricle has moderately decreased function. The left ventricular internal cavity size was normal in size. There is mild left ventricular hypertrophy. Indeterminate diastolic filling due to E-A fusion. Right Ventricle: The right ventricular size is normal. Right vetricular wall thickness was not assessed. Right ventricular systolic function is normal. Left Atrium: Left atrial size was normal in size. Right Atrium: Right atrial size was normal in size. Pericardium: Trivial pericardial effusion is present. Mitral Valve: The mitral valve is abnormal. There is mild thickening of the mitral valve leaflet(s). Mild mitral annular calcification. Trivial mitral valve regurgitation. Tricuspid Valve: The  tricuspid valve is normal in structure. Tricuspid valve regurgitation is trivial. Aortic Valve: The aortic valve is abnormal. Aortic valve regurgitation is not visualized. Mild to moderate aortic valve sclerosis/calcification is present, without any evidence of aortic stenosis. Pulmonic Valve: The pulmonic valve was normal in structure. Pulmonic valve regurgitation is not visualized. IAS/Shunts: The interatrial septum was not assessed.  LEFT VENTRICLE PLAX 2D LVIDd:         5.10 cm LVIDs:         4.00 cm LV PW:         1.20 cm LV IVS:        1.20 cm LVOT diam:     2.10 cm LV SV:         61 LV SV Index:   36 LVOT Area:     3.46 cm  LV Volumes (MOD) LV vol d, MOD A4C: 94.3 ml LV vol s, MOD A4C: 65.7 ml LV SV MOD A4C:     94.3 ml RIGHT VENTRICLE             IVC RV S prime:     12.70 cm/s  IVC diam: 1.60 cm TAPSE (M-mode): 1.8 cm LEFT ATRIUM             Index       RIGHT ATRIUM           Index LA diam:        4.10 cm 2.38 cm/m  RA Area:     12.80 cm LA Vol (A2C):   55.7 ml 32.38 ml/m RA Volume:   27.90 ml  16.22 ml/m LA Vol (A4C):   43.5 ml 25.29 ml/m LA Biplane Vol: 54.1 ml 31.45 ml/m  AORTIC VALVE LVOT Vmax:   85.80 cm/s LVOT Vmean:  62.800 cm/s LVOT VTI:    0.177 m  AORTA Ao Root diam: 2.80 cm Ao Asc diam:  3.00 cm  SHUNTS Systemic VTI:  0.18 m Systemic Diam: 2.10 cm Dietrich Pates MD Electronically signed by Dietrich Pates MD Signature Date/Time: 01/20/2021/4:53:48 PM    Final         Scheduled Meds: . aspirin EC  81 mg Oral Daily  . docusate sodium  100 mg Oral BID  . DULoxetine  120 mg Oral Daily  . enoxaparin (LOVENOX) injection  40 mg Subcutaneous Q24H  . furosemide  40 mg Intravenous BID  . insulin aspart  0-15 Units Subcutaneous TID WC  . insulin aspart  0-5 Units Subcutaneous QHS  . insulin glargine  38 Units Subcutaneous  Daily  . levothyroxine  75 mcg Oral Q0600  . losartan  50 mg Oral Daily  . metoprolol tartrate  50 mg Oral BID  . nicotine  14 mg Transdermal Daily  . potassium chloride   40 mEq Oral Daily  . pravastatin  80 mg Oral QPM  . spironolactone  25 mg Oral Daily   Continuous Infusions:   LOS: 1 day     Jacquelin Hawking, MD [facility] 01/21/2021, 9:49 AM  If 7PM-7AM, please contact night-coverage [email or web address]

## 2021-01-21 NOTE — ED Notes (Signed)
Lunch Tray Ordered @ 1006.  

## 2021-01-22 ENCOUNTER — Encounter (HOSPITAL_COMMUNITY): Payer: Self-pay | Admitting: Family Medicine

## 2021-01-22 ENCOUNTER — Telehealth: Payer: Medicare Other | Admitting: Family Medicine

## 2021-01-22 ENCOUNTER — Inpatient Hospital Stay (HOSPITAL_COMMUNITY)
Admission: EM | Disposition: A | Discharge status: Home / Self Care | Payer: Self-pay | Source: Home / Self Care | Attending: Internal Medicine

## 2021-01-22 DIAGNOSIS — R0789 Other chest pain: Secondary | ICD-10-CM | POA: Diagnosis present

## 2021-01-22 HISTORY — PX: RIGHT/LEFT HEART CATH AND CORONARY ANGIOGRAPHY: CATH118266

## 2021-01-22 LAB — CBC
HCT: 42.7 % (ref 36.0–46.0)
Hemoglobin: 14.3 g/dL (ref 12.0–15.0)
MCH: 29.9 pg (ref 26.0–34.0)
MCHC: 33.5 g/dL (ref 30.0–36.0)
MCV: 89.1 fL (ref 80.0–100.0)
Platelets: 174 10*3/uL (ref 150–400)
RBC: 4.79 MIL/uL (ref 3.87–5.11)
RDW: 13.2 % (ref 11.5–15.5)
WBC: 16.5 10*3/uL — ABNORMAL HIGH (ref 4.0–10.5)
nRBC: 0 % (ref 0.0–0.2)

## 2021-01-22 LAB — POCT I-STAT 7, (LYTES, BLD GAS, ICA,H+H)
Acid-Base Excess: 5 mmol/L — ABNORMAL HIGH (ref 0.0–2.0)
Bicarbonate: 30 mmol/L — ABNORMAL HIGH (ref 20.0–28.0)
Calcium, Ion: 1.2 mmol/L (ref 1.15–1.40)
HCT: 41 % (ref 36.0–46.0)
Hemoglobin: 13.9 g/dL (ref 12.0–15.0)
O2 Saturation: 98 %
Potassium: 3.8 mmol/L (ref 3.5–5.1)
Sodium: 139 mmol/L (ref 135–145)
TCO2: 31 mmol/L (ref 22–32)
pCO2 arterial: 42.8 mmHg (ref 32.0–48.0)
pH, Arterial: 7.454 — ABNORMAL HIGH (ref 7.350–7.450)
pO2, Arterial: 101 mmHg (ref 83.0–108.0)

## 2021-01-22 LAB — POCT I-STAT EG7
Acid-Base Excess: 6 mmol/L — ABNORMAL HIGH (ref 0.0–2.0)
Acid-Base Excess: 6 mmol/L — ABNORMAL HIGH (ref 0.0–2.0)
Bicarbonate: 31.2 mmol/L — ABNORMAL HIGH (ref 20.0–28.0)
Bicarbonate: 31 mmol/L — ABNORMAL HIGH (ref 20.0–28.0)
Calcium, Ion: 1.17 mmol/L (ref 1.15–1.40)
Calcium, Ion: 1.2 mmol/L (ref 1.15–1.40)
HCT: 41 % (ref 36.0–46.0)
HCT: 41 % (ref 36.0–46.0)
Hemoglobin: 13.9 g/dL (ref 12.0–15.0)
Hemoglobin: 13.9 g/dL (ref 12.0–15.0)
O2 Saturation: 77 %
O2 Saturation: 80 %
Potassium: 3.8 mmol/L (ref 3.5–5.1)
Potassium: 3.8 mmol/L (ref 3.5–5.1)
Sodium: 139 mmol/L (ref 135–145)
Sodium: 139 mmol/L (ref 135–145)
TCO2: 33 mmol/L — ABNORMAL HIGH (ref 22–32)
TCO2: 32 mmol/L (ref 22–32)
pCO2, Ven: 47.2 mmHg (ref 44.0–60.0)
pCO2, Ven: 46.3 mmHg (ref 44.0–60.0)
pH, Ven: 7.429 (ref 7.250–7.430)
pH, Ven: 7.434 — ABNORMAL HIGH (ref 7.250–7.430)
pO2, Ven: 41 mmHg (ref 32.0–45.0)
pO2, Ven: 43 mmHg (ref 32.0–45.0)

## 2021-01-22 LAB — BASIC METABOLIC PANEL
Anion gap: 15 (ref 5–15)
BUN: 20 mg/dL (ref 8–23)
CO2: 26 mmol/L (ref 22–32)
Calcium: 9.4 mg/dL (ref 8.9–10.3)
Chloride: 97 mmol/L — ABNORMAL LOW (ref 98–111)
Creatinine, Ser: 1.06 mg/dL — ABNORMAL HIGH (ref 0.44–1.00)
GFR, Estimated: 58 mL/min — ABNORMAL LOW (ref >60)
Glucose, Bld: 197 mg/dL — ABNORMAL HIGH (ref 70–99)
Potassium: 4.9 mmol/L (ref 3.5–5.1)
Sodium: 138 mmol/L (ref 135–145)

## 2021-01-22 LAB — GLUCOSE, CAPILLARY
Glucose-Capillary: 180 mg/dL — ABNORMAL HIGH (ref 70–99)
Glucose-Capillary: 146 mg/dL — ABNORMAL HIGH (ref 70–99)
Glucose-Capillary: 158 mg/dL — ABNORMAL HIGH (ref 70–99)
Glucose-Capillary: 163 mg/dL — ABNORMAL HIGH (ref 70–99)
Glucose-Capillary: 225 mg/dL — ABNORMAL HIGH (ref 70–99)

## 2021-01-22 LAB — POCT ACTIVATED CLOTTING TIME: Activated Clotting Time: 202 seconds

## 2021-01-22 SURGERY — RIGHT/LEFT HEART CATH AND CORONARY ANGIOGRAPHY
Anesthesia: LOCAL

## 2021-01-22 MED ORDER — SODIUM CHLORIDE 0.9% FLUSH: 3.0000 mL | INTRAVENOUS | Status: DC | PRN

## 2021-01-22 MED ORDER — HEPARIN (PORCINE) IN NACL 1000-0.9 UT/500ML-% IV SOLN
INTRAVENOUS | Status: DC | PRN
  Administered 2021-01-22 (×2): 500 mL

## 2021-01-22 MED ORDER — SODIUM CHLORIDE 0.9 % IV SOLN: INTRAVENOUS | Status: AC

## 2021-01-22 MED ORDER — VERAPAMIL HCL 2.5 MG/ML IV SOLN
INTRAVENOUS | Status: DC | PRN
  Administered 2021-01-22: 10 mL via INTRA_ARTERIAL

## 2021-01-22 MED ORDER — HEPARIN SODIUM (PORCINE) 1000 UNIT/ML IJ SOLN
INTRAMUSCULAR | Status: DC | PRN
  Administered 2021-01-22: 3000 [IU] via INTRAVENOUS

## 2021-01-22 MED ORDER — LIDOCAINE HCL (PF) 1 % IJ SOLN
INTRAMUSCULAR | Status: DC | PRN
  Administered 2021-01-22: 10 mL
  Administered 2021-01-22: 5 mL
  Administered 2021-01-22: 2 mL

## 2021-01-22 MED ORDER — HYDRALAZINE HCL 20 MG/ML IJ SOLN
10.0000 mg | Freq: Once | INTRAMUSCULAR | Status: AC
  Administered 2021-01-22: 10 mg via INTRAVENOUS
  Filled 2021-01-22: qty 1

## 2021-01-22 MED ORDER — HEPARIN (PORCINE) IN NACL 2000-0.9 UNIT/L-% IV SOLN
INTRAVENOUS | Status: AC
  Filled 2021-01-22: qty 1000

## 2021-01-22 MED ORDER — SODIUM CHLORIDE 0.9% FLUSH
3.0000 mL | Freq: Two times a day (BID) | INTRAVENOUS | Status: DC
  Administered 2021-01-22 – 2021-01-24 (×3): 3 mL via INTRAVENOUS

## 2021-01-22 MED ORDER — MIDAZOLAM HCL 2 MG/2ML IJ SOLN
INTRAMUSCULAR | Status: AC
  Filled 2021-01-22: qty 2

## 2021-01-22 MED ORDER — HEPARIN (PORCINE) IN NACL 1000-0.9 UT/500ML-% IV SOLN
INTRAVENOUS | Status: AC
  Filled 2021-01-22: qty 1000

## 2021-01-22 MED ORDER — FENTANYL CITRATE (PF) 100 MCG/2ML IJ SOLN
INTRAMUSCULAR | Status: AC
  Filled 2021-01-22: qty 2

## 2021-01-22 MED ORDER — FUROSEMIDE 10 MG/ML IJ SOLN
40.0000 mg | Freq: Two times a day (BID) | INTRAMUSCULAR | Status: DC
  Administered 2021-01-23: 40 mg via INTRAVENOUS
  Filled 2021-01-22: qty 4

## 2021-01-22 MED ORDER — FENTANYL CITRATE (PF) 100 MCG/2ML IJ SOLN
INTRAMUSCULAR | Status: DC | PRN
  Administered 2021-01-22 (×2): 25 ug via INTRAVENOUS

## 2021-01-22 MED ORDER — VERAPAMIL HCL 2.5 MG/ML IV SOLN
INTRAVENOUS | Status: AC
  Filled 2021-01-22: qty 2

## 2021-01-22 MED ORDER — LIDOCAINE HCL (PF) 1 % IJ SOLN
INTRAMUSCULAR | Status: AC
  Filled 2021-01-22: qty 30

## 2021-01-22 MED ORDER — IOHEXOL 350 MG/ML SOLN
INTRAVENOUS | Status: DC | PRN
  Administered 2021-01-22: 55 mL

## 2021-01-22 MED ORDER — MIDAZOLAM HCL 2 MG/2ML IJ SOLN
INTRAMUSCULAR | Status: DC | PRN
  Administered 2021-01-22 (×2): 1 mg via INTRAVENOUS

## 2021-01-22 MED ORDER — SODIUM CHLORIDE 0.9 % IV SOLN: 250.0000 mL | INTRAVENOUS | Status: DC | PRN

## 2021-01-22 SURGICAL SUPPLY — 14 items
CATH OPTITORQUE TIG 4.0 5F (CATHETERS) ×1 IMPLANT
CATH SWAN GANZ 7F STRAIGHT (CATHETERS) ×1 IMPLANT
COVER DOME SNAP 22 D (MISCELLANEOUS) ×1 IMPLANT
DEVICE RAD TR BAND REGULAR (VASCULAR PRODUCTS) ×1 IMPLANT
GLIDESHEATH SLEND A-KIT 6F 22G (SHEATH) ×1 IMPLANT
GUIDEWIRE INQWIRE 1.5J.035X260 (WIRE) IMPLANT
INQWIRE 1.5J .035X260CM (WIRE) ×2
KIT HEART LEFT (KITS) ×2 IMPLANT
PACK CARDIAC CATHETERIZATION (CUSTOM PROCEDURE TRAY) ×2 IMPLANT
SHEATH GLIDE SLENDER 4/5FR (SHEATH) ×1 IMPLANT
SHEATH PINNACLE 7F 10CM (SHEATH) ×1 IMPLANT
SHEATH PROBE COVER 6X72 (BAG) ×2 IMPLANT
TRANSDUCER W/STOPCOCK (MISCELLANEOUS) ×2 IMPLANT
TUBING CIL FLEX 10 FLL-RA (TUBING) ×2 IMPLANT

## 2021-01-22 NOTE — Progress Notes (Signed)
Inpatient Diabetes Program Recommendations  AACE/ADA: New Consensus Statement on Inpatient Glycemic Control (2015)  Target Ranges:  Prepandial:   less than 140 mg/dL      Peak postprandial:   less than 180 mg/dL (1-2 hours)      Critically ill patients:  140 - 180 mg/dL   Lab Results  Component Value Date   GLUCAP 180 (H) 01/22/2021   HGBA1C 6.8 (H) 12/06/2020    Review of Glycemic Control Results for Felicia Hanson, Felicia Hanson (MRN [id number]) as of 01/22/2021 09:35  Ref. Range 01/21/2021 16:32 01/21/2021 21:23 01/22/2021 07:35  Glucose-Capillary Latest Ref Range: 70 - 99 mg/dL 947 (H) 096 (H) 283 (H)   Diabetes history: Type 2 DM Outpatient Diabetes medications: Trulicity 3 mg Qwk and Lantus 38 units QD Current orders for Inpatient glycemic control: Lantus 38 units QD, Novolog 0-15 units TID & HS  Inpatient Diabetes Program Recommendations:    Once diet resumes, consider adding Novolog 3 units TID (assuming patient is consuming >50% of meals).   Thanks, Lujean Rave, MSN, RNC-OB Diabetes Coordinator [phone] (8a-5p)

## 2021-01-22 NOTE — Progress Notes (Signed)
Site area: rt groin fv sheath Site Prior to Removal:  Level 0 Pressure Applied For: 10 minutes Manual:   yes Patient Status During Pull:  sttable Post Pull Site:  Level 0 Post Pull Instructions Given:  yes Post Pull Pulses Present: rt dp palpable Dressing Applied:  Gauze and tegaderm Bedrest begins @ 1635 Comments:

## 2021-01-22 NOTE — Plan of Care (Signed)
  Problem: Education: Goal: Knowledge of General Education information will improve Description: Including pain rating scale, medication(s)/side effects and non-pharmacologic comfort measures Outcome: Progressing   Problem: Health Behavior/Discharge Planning: Goal: Ability to manage health-related needs will improve Outcome: Progressing   Problem: Clinical Measurements: Goal: Ability to maintain clinical measurements within normal limits will improve Outcome: Progressing Goal: Will remain free from infection Outcome: Progressing Goal: Diagnostic test results will improve Outcome: Progressing Goal: Respiratory complications will improve Outcome: Progressing Goal: Cardiovascular complication will be avoided Outcome: Progressing   Problem: Activity: Goal: Risk for activity intolerance will decrease Outcome: Progressing   Problem: Nutrition: Goal: Adequate nutrition will be maintained Outcome: Progressing   Problem: Coping: Goal: Level of anxiety will decrease Outcome: Progressing   Problem: Elimination: Goal: Will not experience complications related to bowel motility Outcome: Progressing Goal: Will not experience complications related to urinary retention Outcome: Progressing   Problem: Pain Managment: Goal: General experience of comfort will improve Outcome: Progressing   Problem: Safety: Goal: Ability to remain free from injury will improve Outcome: Progressing   Problem: Skin Integrity: Goal: Risk for impaired skin integrity will decrease Outcome: Progressing   Problem: Education: Goal: Understanding of CV disease, CV risk reduction, and recovery process will improve Outcome: Progressing Goal: Individualized Educational Video(s) Outcome: Progressing   Problem: Activity: Goal: Ability to return to baseline activity level will improve Outcome: Progressing   Problem: Cardiovascular: Goal: Ability to achieve and maintain adequate cardiovascular perfusion  will improve Outcome: Progressing Goal: Vascular access site(s) Level 0-1 will be maintained Outcome: Progressing   Problem: Health Behavior/Discharge Planning: Goal: Ability to safely manage health-related needs after discharge will improve Outcome: Progressing   Problem: Education: Goal: Ability to demonstrate management of disease process will improve Outcome: Progressing Goal: Ability to verbalize understanding of medication therapies will improve Outcome: Progressing Goal: Individualized Educational Video(s) Outcome: Progressing   Problem: Activity: Goal: Capacity to carry out activities will improve Outcome: Progressing   Problem: Cardiac: Goal: Ability to achieve and maintain adequate cardiopulmonary perfusion will improve Outcome: Progressing   

## 2021-01-22 NOTE — Interval H&P Note (Signed)
History and Physical Interval Note:  01/22/2021 2:41 PM  Felicia Hanson  has presented today for surgery, with the diagnosis of cardiomyopathy.  The various methods of treatment have been discussed with the patient and family. After consideration of risks, benefits and other options for treatment, the patient has consented to  Procedure(s): RIGHT/LEFT HEART CATH AND CORONARY ANGIOGRAPHY (N/A) and possible angioplasty as a surgical intervention.  The patient's history has been reviewed, patient examined, no change in status, stable for surgery.  I have reviewed the patient's chart and labs.  Questions were answered to the patient's satisfaction.    Cath Lab Visit (complete for each Cath Lab visit)  Clinical Evaluation Leading to the Procedure:   ACS: Yes.    Non-ACS:    Anginal Classification: CCS IV  Anti-ischemic medical therapy: Minimal Therapy (1 class of medications)  Non-Invasive Test Results: Intermediate-risk stress test findings: cardiac mortality 1-3%/year  Prior CABG: No previous CABG  Yates Decamp

## 2021-01-22 NOTE — Progress Notes (Signed)
Nutrition Consult   RD consulted for nutrition education regarding diabetes and heart healthy diet.   Lab Results  Component Value Date   HGBA1C 6.8 (H) 12/06/2020    RD provided "Carbohydrate Counting for People with Diabetes" handout from the Academy of Nutrition and Dietetics. Discussed different food groups and their effects on blood sugar, emphasizing carbohydrate-containing foods. Provided list of carbohydrates and recommended serving sizes of common foods.  Provided examples on ways to decrease sodium and fat intake in diet. Discouraged intake of processed foods and use of salt shaker. Encouraged fresh fruits and vegetables as well as whole grain sources of carbohydrates to maximize fiber intake.   Discussed importance of controlled and consistent carbohydrate intake throughout the day. Provided examples of ways to balance meals/snacks and encouraged intake of high-fiber, whole grain complex carbohydrates. Teach back method used.  Expect good compliance.  Body mass index is 32.58 kg/m. Pt meets criteria for obesity based on current BMI.  Current diet order is NPO for cardiac cath today. Previously on a heart healthy carbohydrate modified diet with good intake per patient. Labs and medications reviewed. No further nutrition interventions warranted at this time. RD contact information provided. If additional nutrition issues arise, please re-consult RD.  Gabriel Rainwater, RD, LDN, CNSC Please refer to [facility] for contact information.

## 2021-01-22 NOTE — Progress Notes (Addendum)
PROGRESS NOTE    Felicia Hanson  ZOX:[id number] DOB: 06-02-1956 DOA: 01/20/2021 PCP: Shade Flood, MD   Brief Narrative: 65 year old with past medical history significant for hypothyroidism, hypertension, hyperlipidemia, diabetes, CAD, depression anxiety.  Patient presented with respiratory distress and found to have associated hypoxia requiring the use of BiPAP in the setting of acute on chronic heart failure exacerbation.  Patient was treated with IV Lasix with improvement of her respiratory status.  Patient was found to have ejection fraction of 35 to 40%, cardiology is planning cardiac cath 2/23    Assessment & Plan:   Principal Problem:   Respiratory distress Active Problems:   Generalized anxiety disorder   Essential hypertension, benign   Pure hypercholesterolemia   Hypothyroidism   Type 2 diabetes mellitus without complication, without long-term current use of insulin (HCC)   Acute on chronic combined systolic and diastolic CHF (congestive heart failure) (HCC)   1-Acute Hypoxic Respiratory Failure, Acute Respiratory Distress: Secondary to acute on chronic combined systolic and diastolic heart failure exacerbation -Chest x-ray concerning for infection/inflammatory versus asymmetric anemia.  Likely asymmetric edema. -Was treated with IV Lasix.  She initially required BiPAP during hospitalization.  She is currently on room air.  Acute on chronic combined systolic and diastolic heart failure: Echo show ejection fraction 35 to 40% indeterminate diastolic filling. Cardiology consulted plan for heart cath Continue with Lasix, spironolactone, entresto  Hypertension: Continue with losartan, metoprolol and spironolactone  Hyperlipidemia: Continue with pravastatin  Diabetes mellitus Type 2: Hemoglobin A1c 6.8. Continue with Lantus and sliding scale Resume Trulicity at discharge  Leukocytosis trending down. Received Steroid by EMS>  CKD stage 2. Monitor renal function.  Correction pateint is CKD stage 2 not 3  Estimated body mass index is 32.58 kg/m as calculated from the following:   Height as of this encounter: 5' (1.524 m).   Weight as of this encounter: 75.7 kg.   DVT prophylaxis: Lovenox Code Status: Full code Family Communication: care discussed with patient.  Disposition Plan:  Status is: Inpatient  Remains inpatient appropriate because:Ongoing diagnostic testing needed not appropriate for outpatient work up   Dispo: The patient is from: Home              Anticipated d/c is to: Home              Anticipated d/c date is: 2 days              Patient currently is not medically stable to d/c.   Difficult to place patient No        Consultants:   Cardiology   Procedures:   Cath 2/23  Antimicrobials:  None  Subjective: Feeling well, denies chest pain.  Awaiting cath.   Objective: Vitals:   01/22/21 0822 01/22/21 0823 01/22/21 1200 01/22/21 1346  BP:  (!) 158/79 (!) 171/82 (!) 172/79  Pulse: 84 85 84   Resp:  18 18   Temp:  (!) 97.5 F (36.4 C) 97.6 F (36.4 C)   TempSrc:  Oral Oral   SpO2:  94%    Weight:      Height:        Intake/Output Summary (Last 24 hours) at 01/22/2021 1440 Last data filed at 01/22/2021 0900 Gross per 24 hour  Intake --  Output 700 ml  Net -700 ml   Filed Weights   01/21/21 1543 01/22/21 0429  Weight: 76 kg 75.7 kg    Examination:  General exam: Appears calm and comfortable  Respiratory system: Clear to auscultation. Respiratory effort normal. Cardiovascular system: S1 & S2 heard, RRR. No JVD, murmurs, rubs, gallops or clicks. No pedal edema. Gastrointestinal system: Abdomen is nondistended, soft and nontender. No organomegaly or masses felt. Normal bowel sounds heard. Central nervous system: Alert and oriented.  Extremities: Symmetric 5 x 5 power.    Data Reviewed: I have personally reviewed following labs and imaging studies  CBC: Recent Labs  Lab 01/20/21 0145  01/20/21 0220 01/21/21 0916 01/22/21 0240  WBC 17.8*  --  20.0* 16.5*  NEUTROABS 9.3*  --   --   --   HGB 15.7* 13.9 14.4 14.3  HCT 49.4* 41.0 43.9 42.7  MCV 92.5  --  88.5 89.1  PLT 234  --  228 174   Basic Metabolic Panel: Recent Labs  Lab 01/20/21 0145 01/20/21 0220 01/20/21 0356 01/21/21 0500 01/21/21 1746 01/22/21 0240  NA 135 137  --  136 140 138  K 3.5 3.2*  --  4.2 5.3* 4.9  CL 98  --   --  98 100 97*  CO2 17*  --   --  25 24 26   GLUCOSE 236*  --   --  158* 271* 197*  BUN 8  --   --  19 21 20   CREATININE 1.29*  --   --  1.12* 1.29* 1.06*  CALCIUM 8.6*  --   --  9.0 9.7 9.4  MG 5.6*  --  2.5* 2.0  --   --    GFR: Estimated Creatinine Clearance: 47.5 mL/min (A) (by C-G formula based on SCr of 1.06 mg/dL (H)). Liver Function Tests: Recent Labs  Lab 01/20/21 0145  AST 37  ALT 18  ALKPHOS 68  BILITOT 1.0  PROT 7.3  ALBUMIN 3.2*   No results for input(s): LIPASE, AMYLASE in the last 168 hours. No results for input(s): AMMONIA in the last 168 hours. Coagulation Profile: No results for input(s): INR, PROTIME in the last 168 hours. Cardiac Enzymes: No results for input(s): CKTOTAL, CKMB, CKMBINDEX, TROPONINI in the last 168 hours. BNP (last 3 results) No results for input(s): PROBNP in the last 8760 hours. HbA1C: No results for input(s): HGBA1C in the last 72 hours. CBG: Recent Labs  Lab 01/21/21 1218 01/21/21 1632 01/21/21 2123 01/22/21 0735 01/22/21 1150  GLUCAP 208* 268* 238* 180* 146*   Lipid Profile: No results for input(s): CHOL, HDL, LDLCALC, TRIG, CHOLHDL, LDLDIRECT in the last 72 hours. Thyroid Function Tests: Recent Labs    01/20/21 0918  TSH 0.294*   Anemia Panel: No results for input(s): VITAMINB12, FOLATE, FERRITIN, TIBC, IRON, RETICCTPCT in the last 72 hours. Sepsis Labs: Recent Labs  Lab 01/20/21 0651  PROCALCITON 0.40    Recent Results (from the past 240 hour(s))  Resp Panel by RT-PCR (Flu A&B, Covid) Nasopharyngeal Swab      Status: None   Collection Time: 01/20/21  2:05 AM   Specimen: Nasopharyngeal Swab; Nasopharyngeal(NP) swabs in vial transport medium  Result Value Ref Range Status   SARS Coronavirus 2 by RT PCR NEGATIVE NEGATIVE Final    Comment: (NOTE) SARS-CoV-2 target nucleic acids are NOT DETECTED.  The SARS-CoV-2 RNA is generally detectable in upper respiratory specimens during the acute phase of infection. The lowest concentration of SARS-CoV-2 viral copies this assay can detect is 138 copies/mL. A negative result does not preclude SARS-Cov-2 infection and should not be used as the sole basis for treatment or other patient management decisions. A negative result may occur  with  improper specimen collection/handling, submission of specimen other than nasopharyngeal swab, presence of viral mutation(s) within the areas targeted by this assay, and inadequate number of viral copies(<138 copies/mL). A negative result must be combined with clinical observations, patient history, and epidemiological information. The expected result is Negative.  Fact Sheet for Patients:  BloggerCourse.com  Fact Sheet for Healthcare Providers:  SeriousBroker.it  This test is no t yet approved or cleared by the [place] FDA and  has been authorized for detection and/or diagnosis of SARS-CoV-2 by FDA under an Emergency Use Authorization (EUA). This EUA will remain  in effect (meaning this test can be used) for the duration of the COVID-19 declaration under Section 564(b)(1) of the Act, 21 U.S.C.section 360bbb-3(b)(1), unless the authorization is terminated  or revoked sooner.       Influenza A by PCR NEGATIVE NEGATIVE Final   Influenza B by PCR NEGATIVE NEGATIVE Final    Comment: (NOTE) The Xpert Xpress SARS-CoV-2/FLU/RSV plus assay is intended as an aid in the diagnosis of influenza from Nasopharyngeal swab specimens and should not be used as a sole basis  for treatment. Nasal washings and aspirates are unacceptable for Xpert Xpress SARS-CoV-2/FLU/RSV testing.  Fact Sheet for Patients: BloggerCourse.com  Fact Sheet for Healthcare Providers: SeriousBroker.it  This test is not yet approved or cleared by the [place] FDA and has been authorized for detection and/or diagnosis of SARS-CoV-2 by FDA under an Emergency Use Authorization (EUA). This EUA will remain in effect (meaning this test can be used) for the duration of the COVID-19 declaration under Section 564(b)(1) of the Act, 21 U.S.C. section 360bbb-3(b)(1), unless the authorization is terminated or revoked.  Performed at [facility], [address], [place], [place] [address]          Radiology Studies: No results found.      Scheduled Meds: . [MAR Hold] aspirin EC  81 mg Oral Daily  . [MAR Hold] docusate sodium  100 mg Oral BID  . [MAR Hold] DULoxetine  120 mg Oral Daily  . [MAR Hold] enoxaparin (LOVENOX) injection  40 mg Subcutaneous Q24H  . [MAR Hold] furosemide  40 mg Intravenous Daily  . [MAR Hold] insulin aspart  0-15 Units Subcutaneous TID WC  . [MAR Hold] insulin aspart  0-5 Units Subcutaneous QHS  . [MAR Hold] insulin glargine  38 Units Subcutaneous Daily  . [MAR Hold] levothyroxine  75 mcg Oral Q0600  . [MAR Hold] metoprolol succinate  100 mg Oral Daily  . [MAR Hold] nicotine  14 mg Transdermal Daily  . [MAR Hold] pravastatin  80 mg Oral QPM  . [MAR Hold] sacubitril-valsartan  1 tablet Oral BID  . sodium chloride flush  3 mL Intravenous Q12H  . [MAR Hold] spironolactone  25 mg Oral Daily   Continuous Infusions: . sodium chloride    . sodium chloride 1 mL/kg/hr (01/22/21 0500)     LOS: 2 days    Time spent: 35 minutes.     Alba Cory, MD [facility]   If 7PM-7AM, please contact night-coverage [email or web address]  01/22/2021, 2:40 PM

## 2021-01-22 NOTE — Evaluation (Signed)
Physical Therapy Evaluation and Discharge Patient Details Name: Felicia Hanson MRN: [id number] DOB: 08/29/1956 Today's Date: 01/22/2021   History of Present Illness  Pt is a 65 y/o female admitted with acute respiratory failure with hypoxia secondary to acute on chronic combined systolic and diastolic heart failure. Pt initially placed on bipap.  PMH - HTN, DM, CAD, depression/anxiety.  Clinical Impression  Patient evaluated by Physical Therapy with no further acute PT needs identified. All education has been completed and the patient has no further questions. Pt overall at an independent to mod I level with transfers, gait, and stair navigation. No LOB noted. Pt tolerated gait well and did not have SOB. Educated about walking program and pursed lip breathing to help with anxiety. Pt reports husband available to assist at d/c.  See below for any follow-up Physical Therapy or equipment needs. PT is signing off. Thank you for this referral. If needs change, please re-consult.      Follow Up Recommendations No PT follow up    Equipment Recommendations  None recommended by PT    Recommendations for Other Services       Precautions / Restrictions Precautions Precautions: None Restrictions Weight Bearing Restrictions: No      Mobility  Bed Mobility Overal bed mobility: Independent                  Transfers Overall transfer level: Independent Equipment used: None                Ambulation/Gait Ambulation/Gait assistance: Modified independent (Device/Increase time) Gait Distance (Feet): 200 Feet Assistive device: IV Pole Gait Pattern/deviations: Step-through pattern;Decreased stride length Gait velocity: mildly Decreased   General Gait Details: Slower gait speed, but overall steady. No LOB noted and no SOB noted. Educated about walking program to perform at home.  Stairs Stairs: Yes Stairs assistance: Modified independent (Device/Increase time) Stair Management:  Step to pattern;Forwards;One rail Right Number of Stairs: 2 General stair comments: Overall steady stair navigation using step to pattern. No LOB noted.  Wheelchair Mobility    Modified Rankin (Stroke Patients Only)       Balance Overall balance assessment: Modified Independent                                           Pertinent Vitals/Pain Pain Assessment: No/denies pain    Home Living Family/patient expects to be discharged to:: Private residence Living Arrangements: Spouse/significant other Available Help at Discharge: Family;Available 24 hours/day Type of Home: House Home Access: Stairs to enter Entrance Stairs-Rails: None Entrance Stairs-Number of Steps: 2 Home Layout: One level Home Equipment: None      Prior Function Level of Independence: Independent         Comments: retired, drives     Higher education careers adviser   Dominant Hand: Right    Extremity/Trunk Assessment   Upper Extremity Assessment Upper Extremity Assessment: Defer to OT evaluation    Lower Extremity Assessment Lower Extremity Assessment: Overall WFL for tasks assessed    Cervical / Trunk Assessment Cervical / Trunk Assessment: Normal  Communication   Communication: No difficulties  Cognition Arousal/Alertness: Awake/alert Behavior During Therapy: WFL for tasks assessed/performed Overall Cognitive Status: Within Functional Limits for tasks assessed  General Comments General comments (skin integrity, edema, etc.): Pt's husband present during session.    Exercises     Assessment/Plan    PT Assessment Patent does not need any further PT services  PT Problem List         PT Treatment Interventions      PT Goals (Current goals can be found in the Care Plan section)  Acute Rehab PT Goals Patient Stated Goal: return home PT Goal Formulation: With patient Time For Goal Achievement: 01/22/21 Potential to Achieve  Goals: Good    Frequency     Barriers to discharge        Co-evaluation               AM-PAC PT "6 Clicks" Mobility  Outcome Measure Help needed turning from your back to your side while in a flat bed without using bedrails?: None Help needed moving from lying on your back to sitting on the side of a flat bed without using bedrails?: None Help needed moving to and from a bed to a chair (including a wheelchair)?: None Help needed standing up from a chair using your arms (e.g., wheelchair or bedside chair)?: None Help needed to walk in hospital room?: None Help needed climbing 3-5 steps with a railing? : None 6 Click Score: 24    End of Session Equipment Utilized During Treatment: Gait belt Activity Tolerance: Patient tolerated treatment well Patient left: in bed;with call bell/phone within reach;with family/visitor present Nurse Communication: Mobility status PT Visit Diagnosis: Other abnormalities of gait and mobility (R26.89)    Time: 1355-1405 PT Time Calculation (min) (ACUTE ONLY): 10 min   Charges:   PT Evaluation $PT Eval Low Complexity: 1 Low          Felicia Hanson, DPT  Acute Rehabilitation Services  Pager: [phone] Office: [phone]   Felicia Hanson 01/22/2021, 2:30 PM

## 2021-01-22 NOTE — Progress Notes (Signed)
Heart Failure Stewardship Pharmacist Progress Note   PCP: Shade Flood, MD PCP-Cardiologist: No primary care provider on file.    HPI:  65 yo F with PMH of hypothyroidism, HTN, HLD, T2DM, CAD, depression and anxiety. She presented to the ED on 01/20/21 with respiratory distress. She was admitted for acute on chronic combined systolic and diastolic heart failure. An ECHO was done on 01/20/21 and LVEF was 35-40% (was 21% on stress test in June 2021). Pending R/LHC today.   Current HF Medications: Furosemide 40 mg IV daily Metoprolol XL 100 mg daily Entresto 49/51 mg BID Spironolactone 25 mg daily  Prior to admission HF Medications: Losartan 50 mg daily Metoprolol tartrate 50 mg BID  Pertinent Lab Values: . Serum creatinine 1.06, BUN 20, Potassium 4.9, Sodium 138, BNP 206.1, Magnesium 2.0   Vital Signs: . Weight: 166 lbs (admission weight: 167 lbs) . Blood pressure: 170/80s  . Heart rate: 80s  Medication Assistance / Insurance Benefits Check: Does the patient have prescription insurance?  Yes Type of insurance plan: UHC Medicare  Does the patient qualify for medication assistance through manufacturers or grants?   Pending . Eligible grants and/or patient assistance programs: pending . Medication assistance applications in progress: none  . Medication assistance applications approved: none Approved medication assistance renewals will be completed by: TBD  Outpatient Pharmacy:  Prior to admission outpatient [facility] Is the patient willing to use [facility] TOC pharmacy at discharge? Yes Is the patient willing to transition their outpatient pharmacy to utilize a [facility] outpatient pharmacy?   Pending   Assessment: 1. Acute on chronic combined systolic and diastolic CHF (EF 42-68%). R/LHC today. NYHA class II symptoms. - Continue furosemide 40 mg IV daily - Continue metoprolol XL 100 mg daily - Continue Entresto 49/51 mg BID - Continue spironolactone 25 mg daily -  Consider starting Farxiga 10 mg daily prior to discharge   Plan: 1) Medication changes recommended at this time: - Continue current regimen; may be able to start Farxiga after cath  2) Patient assistance: Sherryll Burger copay: $47 per month - Farxiga or Jardiance copay: $47 per month - Will help her enroll in patient assistance to bring copays down to $0 a month for both Sherryll Burger and Farxiga (if this is added prior to discharge)  3)  Education  - To be completed prior to discharge  Sharen Hones, PharmD, BCPS Heart Failure Stewardship Pharmacist Phone [phone]

## 2021-01-22 NOTE — Progress Notes (Addendum)
Heart Failure Nurse Navigator Progress Note  PCP: Shade Flood, MD PCP-Cardiologist: Emelda Brothers DO Admission Diagnosis: resp distress Admitted from: home with spouse  Presentation:   Felicia Hanson presented with respiratory distress. Noted to be overloaded. Cath scheduled for today (2/23).   ECHO/ LVEF: 35-40%  Clinical Course:  Past Medical History:  Diagnosis Date  . Anxiety   . Coronary artery calcification   . Depression   . Diabetes mellitus without complication (HCC)   . Hyperlipidemia   . Hypertension   . Thyroid disease      Social History   Socioeconomic History  . Marital status: Married    Spouse name: Not on file  . Number of children: 2  . Years of education: Not on file  . Highest education level: Not on file  Occupational History  . Not on file  Tobacco Use  . Smoking status: Current Every Day Smoker    Packs/day: 0.75    Years: 38.00    Pack years: 28.50    Types: Cigarettes  . Smokeless tobacco: Never Used  . Tobacco comment: not ready to quit but has decreased from 2ppd to 0.75ppd   Vaping Use  . Vaping Use: Former  Substance and Sexual Activity  . Alcohol use: No    Alcohol/week: 0.0 standard drinks  . Drug use: No  . Sexual activity: Not Currently  Other Topics Concern  . 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 Difficulty of Paying Living Expenses: Not very hard  Food Insecurity: No Food Insecurity  . Worried About Programme researcher, broadcasting/film/video in the Last Year: Never true  . Ran Out of Food in the Last Year: Never true  Transportation Needs: No Transportation Needs  . Lack of Transportation (Medical): No  . Lack of Transportation (Non-Medical): No  Physical Activity: Not  on file  Stress: Not on file  Social Connections: Not on file   High Risk Criteria for Readmission and/or Poor Patient Outcomes:  Heart failure hospital admissions (last 6 months): 1  No Show rate: 17%  Difficult social situation: yes  Demonstrates medication adherence: yes  Primary Language: English  Literacy level: able to read/write and comprehend.   Education Assessment and Provision:  Detailed education and instructions provided on heart failure disease management including the following:  Signs and symptoms of Heart Failure When to call the physician Importance of daily weights Low sodium diet Fluid restriction Medication management Anticipated future follow-up appointments  Patient education given on each of the above topics.  Patient acknowledges understanding via teach back method and acceptance of all instructions.  Education Materials:  "Living Better With Heart Failure" Booklet, HF zone tool, & Daily Weight Tracker Tool.  Patient has scale at home: yes Patient has pill box at home: yes  Barriers of Care:   -medication cost -education/knowledge  Considerations/Referrals:   Referral made to Heart Failure Pharmacist Stewardship: yes, appreciated Referral made to Heart & Vascular TOC clinic: yes, will make appointment closer to DC.   Items for Follow-up on DC/TOC: -education -medication optimization -medication cost -smoking cessation  [name]  Jordan Likes, RN, BSN Heart Failure Nurse Navigator [phone]

## 2021-01-22 NOTE — Evaluation (Signed)
Occupational Therapy Evaluation and Discharge Patient Details Name: Felicia Hanson MRN: [id number] DOB: 30-Jan-1956 Today's Date: 01/22/2021    History of Present Illness Pt adm with acute respiratory failure with hypoxia secondary to acute on chronic combined systolic and diastolic heart failure. Pt initially placed on bipap.  PMH - HTN, DM, CAD, depression/anxiety.   Clinical Impression   Pt is functioning independently. No OT needs.     Follow Up Recommendations  No OT follow up    Equipment Recommendations  None recommended by OT    Recommendations for Other Services       Precautions / Restrictions Precautions Precautions: None      Mobility Bed Mobility Overal bed mobility: Independent                  Transfers Overall transfer level: Independent Equipment used: None                  Balance                                           ADL either performed or assessed with clinical judgement   ADL Overall ADL's : Independent                                             Vision Patient Visual Report: No change from baseline       Perception     Praxis      Pertinent Vitals/Pain Pain Assessment: No/denies pain     Hand Dominance Right   Extremity/Trunk Assessment Upper Extremity Assessment Upper Extremity Assessment: Overall WFL for tasks assessed (hx of L wrist fx)   Lower Extremity Assessment Lower Extremity Assessment: Defer to PT evaluation   Cervical / Trunk Assessment Cervical / Trunk Assessment: Normal   Communication Communication Communication: No difficulties   Cognition Arousal/Alertness: Awake/alert Behavior During Therapy: WFL for tasks assessed/performed Overall Cognitive Status: Within Functional Limits for tasks assessed                                     General Comments       Exercises     Shoulder Instructions      Home Living Family/patient  expects to be discharged to:: Private residence Living Arrangements: Spouse/significant other Available Help at Discharge: Family;Available 24 hours/day Type of Home: House Home Access: Stairs to enter [employer] of Steps: 2 Entrance Stairs-Rails: None Home Layout: One level     Bathroom Shower/Tub: Chief Strategy Officer: Standard     Home Equipment: None          Prior Functioning/Environment Level of Independence: Independent        Comments: retired, drives        OT Problem List:        OT Treatment/Interventions:      OT Goals(Current goals can be found in the care plan section) Acute Rehab OT Goals Patient Stated Goal: return home  OT Frequency:     Barriers to D/C:            Co-evaluation              AM-PAC OT "  6 Clicks" Daily Activity     Outcome Measure Help from another person eating meals?: None Help from another person taking care of personal grooming?: None Help from another person toileting, which includes using toliet, bedpan, or urinal?: None Help from another person bathing (including washing, rinsing, drying)?: None Help from another person to put on and taking off regular upper body clothing?: None Help from another person to put on and taking off regular lower body clothing?: None 6 Click Score: 24   End of Session    Activity Tolerance: Patient tolerated treatment well Patient left: in bed;with call bell/phone within reach;with family/visitor present  OT Visit Diagnosis: Other (comment) (decreaesd activity tolerance)                Time: 3846-6599 OT Time Calculation (min): 14 min Charges:  OT General Charges $OT Visit: 1 Visit OT Evaluation $OT Eval Low Complexity: 1 Low  Martie Round, OTR/L Acute Rehabilitation Services Pager: [phone] Office: [phone]  Evern Bio 01/22/2021, 9:37 AM

## 2021-01-23 ENCOUNTER — Encounter (HOSPITAL_COMMUNITY): Payer: Self-pay | Admitting: Cardiology

## 2021-01-23 LAB — BASIC METABOLIC PANEL
Anion gap: 12 (ref 5–15)
BUN: 18 mg/dL (ref 8–23)
CO2: 26 mmol/L (ref 22–32)
Calcium: 8.7 mg/dL — ABNORMAL LOW (ref 8.9–10.3)
Chloride: 99 mmol/L (ref 98–111)
Creatinine, Ser: 0.9 mg/dL (ref 0.44–1.00)
GFR, Estimated: >60 mL/min (ref >60)
Glucose, Bld: 160 mg/dL — ABNORMAL HIGH (ref 70–99)
Potassium: 4.4 mmol/L (ref 3.5–5.1)
Sodium: 137 mmol/L (ref 135–145)

## 2021-01-23 LAB — CBC
HCT: 44.3 % (ref 36.0–46.0)
Hemoglobin: 15.2 g/dL — ABNORMAL HIGH (ref 12.0–15.0)
MCH: 30 pg (ref 26.0–34.0)
MCHC: 34.3 g/dL (ref 30.0–36.0)
MCV: 87.5 fL (ref 80.0–100.0)
Platelets: 201 10*3/uL (ref 150–400)
RBC: 5.06 MIL/uL (ref 3.87–5.11)
RDW: 13.5 % (ref 11.5–15.5)
WBC: 13.9 10*3/uL — ABNORMAL HIGH (ref 4.0–10.5)
nRBC: 0 % (ref 0.0–0.2)

## 2021-01-23 LAB — GLUCOSE, CAPILLARY
Glucose-Capillary: 145 mg/dL — ABNORMAL HIGH (ref 70–99)
Glucose-Capillary: 169 mg/dL — ABNORMAL HIGH (ref 70–99)
Glucose-Capillary: 180 mg/dL — ABNORMAL HIGH (ref 70–99)
Glucose-Capillary: 182 mg/dL — ABNORMAL HIGH (ref 70–99)
Glucose-Capillary: 215 mg/dL — ABNORMAL HIGH (ref 70–99)
Glucose-Capillary: 260 mg/dL — ABNORMAL HIGH (ref 70–99)

## 2021-01-23 LAB — MAGNESIUM: Magnesium: 1.7 mg/dL (ref 1.7–2.4)

## 2021-01-23 LAB — POCT ACTIVATED CLOTTING TIME: Activated Clotting Time: 190 seconds

## 2021-01-23 MED ORDER — BUMETANIDE 0.25 MG/ML IJ SOLN
1.0000 mg | Freq: Once | INTRAMUSCULAR | Status: AC
  Administered 2021-01-23: 1 mg via INTRAVENOUS
  Filled 2021-01-23: qty 4

## 2021-01-23 MED ORDER — DAPAGLIFLOZIN PROPANEDIOL 10 MG PO TABS
10.0000 mg | ORAL_TABLET | Freq: Every day | ORAL | Status: DC
  Administered 2021-01-23 – 2021-01-24 (×2): 10 mg via ORAL
  Filled 2021-01-23 (×2): qty 1

## 2021-01-23 MED ORDER — EZETIMIBE 10 MG PO TABS
10.0000 mg | ORAL_TABLET | Freq: Every day | ORAL | Status: DC
  Administered 2021-01-23 – 2021-01-24 (×2): 10 mg via ORAL
  Filled 2021-01-23 (×2): qty 1

## 2021-01-23 MED FILL — Heparin Sod (Porcine)-NaCl IV Soln 1000 Unit/500ML-0.9%: INTRAVENOUS | Qty: 500 | Status: AC

## 2021-01-23 NOTE — Progress Notes (Addendum)
Progress Note  Patient Name: Felicia Hanson Date of Encounter: 01/23/2021  Attending physician: Alba Cory, MD Primary Physician:  Shade Flood, MD Outpatient Cardiologist: Tessa Lerner, DO, [facility] Inpatient Cardiologist: Tessa Lerner, DO, [facility], [place], [place] Referring physician: Dr. Jonah Blue   Subjective: Felicia Hanson is a 65 y.o. female who was seen and examined at bedside at 7:45  AM. She underwent cardiac catheterization yesterday (01/22/2021) revealing mild diffuse coronary artery disease, nonischemic cardiomyopathy with moderate LV systolic dysfunction (LVEF 35%) as well as mild to moderate pulmonary hypertension likely secondary to diastolic dysfunction with LV EDP elevation. This morning patient is feeling well, without shortness of breath or chest pain. Denies palpitations, dizziness.  Started Entresto yesterday, awaiting repeat BMP. Blood pressure control has improved. Patient is net -755 cc since admission, patient reports good urine output although she is unsure if her output has been consistently measured.  Objective: Vital Signs in the last 24 hours: Temp:  [97.4 F (36.3 C)-98.3 F (36.8 C)] 98.3 F (36.8 C) (02/24 1143) Pulse Rate:  [0-97] 75 (02/24 1143) Resp:  [8-36] 14 (02/24 1143) BP: (136-173)/(64-90) 158/78 (02/24 1143) SpO2:  [92 %-100 %] 94 % (02/24 1143) Weight:  [76.5 kg] 76.5 kg (02/24 0509)  Intake/Output:  Intake/Output Summary (Last 24 hours) at 01/23/2021 1248 Last data filed at 01/23/2021 0600 Gross per 24 hour  Intake 1044.87 ml  Output 950 ml  Net 94.87 ml    Net IO Since Admission: -755.13 mL [01/23/21 1248]  Weights:  Filed Weights   01/21/21 1543 01/22/21 0429 01/23/21 0509  Weight: 76 kg 75.7 kg 76.5 kg    Telemetry: Personally reviewed.  Sinus rhythm with PVCs   Physical examination: PHYSICAL EXAM: Vitals with BMI 01/23/2021 01/23/2021 01/23/2021  Height - - -  Weight - - 168 lbs 10 oz  BMI - - 32.94   Systolic 158 136 498  Diastolic 78 75 67  Pulse 75 68 74   Physical Exam Vitals and nursing note reviewed.  Constitutional:      General: She is awake. She is not in acute distress.    Appearance: She is well-developed. She is obese.  HENT:     Head: Normocephalic and atraumatic.     Right Ear: External ear normal.     Left Ear: External ear normal.     Nose: Nose normal.     Mouth/Throat:     Mouth: Mucous membranes are moist.  Eyes:     Extraocular Movements: Extraocular movements intact.     Conjunctiva/sclera: Conjunctivae normal.  Neck:     Vascular: No carotid bruit or JVD.  Cardiovascular:     Rate and Rhythm: Normal rate and regular rhythm.     Pulses: Intact distal pulses.     Heart sounds: S1 normal and S2 normal. No murmur heard. No gallop.   Pulmonary:     Breath sounds: Decreased breath sounds (Bilaterally throughout) present. No wheezing, rhonchi or rales.  Abdominal:     General: Bowel sounds are normal. There is no distension.     Palpations: Abdomen is soft.     Tenderness: There is no abdominal tenderness.     Comments: No ascites noted  Musculoskeletal:     Right lower leg: No edema.     Left lower leg: No edema.  Skin:    General: Skin is warm and dry.  Neurological:     General: No focal deficit present.     Mental Status:  She is alert and oriented to person, place, and time.     Cranial Nerves: No cranial nerve deficit.  Psychiatric:        Mood and Affect: Mood normal.        Behavior: Behavior normal.        Thought Content: Thought content normal.    Lab Results: Hematology Recent Labs  Lab 01/21/21 0916 01/22/21 0240 01/22/21 1520 01/22/21 1523 01/22/21 1535 01/23/21 0921  WBC 20.0* 16.5*  --   --   --  13.9*  RBC 4.96 4.79  --   --   --  5.06  HGB 14.4 14.3   < > 13.9 13.9 15.2*  HCT 43.9 42.7   < > 41.0 41.0 44.3  MCV 88.5 89.1  --   --   --  87.5  MCH 29.0 29.9  --   --   --  30.0  MCHC 32.8 33.5  --   --   --  34.3  RDW  13.2 13.2  --   --   --  13.5  PLT 228 174  --   --   --  201   < > = values in this interval not displayed.    Chemistry Recent Labs  Lab 01/20/21 0145 01/20/21 0220 01/21/21 1746 01/22/21 0240 01/22/21 1520 01/22/21 1523 01/22/21 1535 01/23/21 0824  NA 135   < > 140 138   < > 139 139 137  K 3.5   < > 5.3* 4.9   < > 3.8 3.8 4.4  CL 98   < > 100 97*  --   --   --  99  CO2 17*   < > 24 26  --   --   --  26  GLUCOSE 236*   < > 271* 197*  --   --   --  160*  BUN 8   < > 21 20  --   --   --  18  CREATININE 1.29*   < > 1.29* 1.06*  --   --   --  0.90  CALCIUM 8.6*   < > 9.7 9.4  --   --   --  8.7*  PROT 7.3  --   --   --   --   --   --   --   ALBUMIN 3.2*  --   --   --   --   --   --   --   AST 37  --   --   --   --   --   --   --   ALT 18  --   --   --   --   --   --   --   ALKPHOS 68  --   --   --   --   --   --   --   BILITOT 1.0  --   --   --   --   --   --   --   GFRNONAA 46*   < > 46* 58*  --   --   --  >60  ANIONGAP 20*   < > 16* 15  --   --   --  12   < > = values in this interval not displayed.     Cardiac Enzymes: Cardiac Panel (last 3 results) No results for input(s): CKTOTAL, CKMB, TROPONINIHS, RELINDX in the last 72 hours.  BNP (last  3 results) Recent Labs    01/20/21 0159 01/21/21 0916  BNP 206.1* 135.8*    ProBNP (last 3 results) No results for input(s): PROBNP in the last 8760 hours.   DDimer No results for input(s): DDIMER in the last 168 hours.   Hemoglobin A1c:  Lab Results  Component Value Date   HGBA1C 6.8 (H) 12/06/2020   MPG 114 01/10/2014    TSH  Recent Labs    04/03/20 1757 09/06/20 1152 01/20/21 0918  TSH 0.732 1.160 0.294*    Lipid Panel     Component Value Date/Time   CHOL 153 12/06/2020 1638   TRIG 158 (H) 12/06/2020 1638   HDL 46 12/06/2020 1638   CHOLHDL 3.3 12/06/2020 1638   CHOLHDL 5.0 08/09/2015 1504   VLDL 44 (H) 08/09/2015 1504   LDLCALC 80 12/06/2020 1638    Imaging: CARDIAC CATHETERIZATION  Result  Date: 01/22/2021 Right + left heart catheterization 01/22/2021: RA 16/12, mean 11 mmHg, RA saturation 80%. RV 47/8, EDP 15 mmHg. PA 41/26, mean 33 mmHg.  PA saturation 77%. PW 32/26, mean 29 mmHg.  Aortic saturation 98%. Cardiac output 6.58, cardiac index 3.80 by Fick.  Stroke-volume 70 mL.  SVR 16.87, PVR 1.05.  QP/QS 0.86. LV: Global hypokinesis, EF 35%.  EDP markedly elevated at 32 mmHg.  There was no pressure gradient across the aortic valve. Left main: Mild disease. LAD: Mild diffuse disease. CX: Mild diffuse disease. RI: Mild diffuse disease. RCA: Dominant, mild diffuse disease. Impression: Nonischemic cardiomyopathy with severe LV systolic dysfunction.  Mild to moderate pulmonary hypertension secondary to diastolic dysfunction with LV EDP elevation. Recommendation: Evaluation for nonischemic cardiomyopathy, aggressive diuresis.  55 mL contrast utilized.    CARDIAC DATABASE: EKG: 01/20/2021: Normal sinus rhythm, 88 bpm, left axis deviation, biatrial enlargement, left bundle branch block.  Coronary calcium scoring 05/13/2015:LAD: 8, Cx: 2. Total calcium score is 10.   Echocardiogram: 03/2015: LVEF 44%, mild LVH, mild to moderate decrease in global wall motion, grade 2 diastolic impairment, mildly dilated left atrium, mild MR and TR.  04/23/2020: Left ventricle cavity is normal in size. Moderate concentric hypertrophy of the left ventricle. Moderate global hypokinesis. LVEF 35-40%. Indeterminate diastolic function due to E/A fusion. Moderately dilated left atrium, mild MR.  01/20/2021:  1. Left ventricular ejection fraction, by estimation, is 35 to 40%. The left ventricle has moderately decreased function. There is mild left ventricular hypertrophy. Indeterminate diastolic filling due to E-A fusion.  2. Right ventricular systolic function is normal. The right ventricular size is normal.  3. The mitral valve is abnormal. Trivial mitral valve regurgitation.  4. The aortic valve is abnormal.  Aortic valve regurgitation is not visualized. Mild to moderate aortic valve sclerosis/calcification is present, without any evidence of aortic stenosis.   Stress Testing: Lexiscan Tetrofosmin Stress Test 05/13/2020:  There is a fixed mild defect in the inferior region due to diaphragmatic attenuation. No ischemia or scar.  The LV is moderately dilated with LV end diastolic volume of 173 mL.  Overall LV systolic function is abnormal without regional wall motion abnormalities. Stress LV EF: 21%.  High risk study due to low LVEF. Findings suggest non ischemic dilated cardiomyopathy.  Heart Catheterization: Right + left heart catheterization 01/22/2021: RA 16/12, mean 11 mmHg, RA saturation 80%. RV 47/8, EDP 15 mmHg. PA 41/26, mean 33 mmHg.  PA saturation 77%. PW 32/26, mean 29 mmHg.  Aortic saturation 98%. Cardiac output 6.58, cardiac index 3.80 by Fick.  Stroke-volume 70 mL.  SVR 16.87, PVR  1.05.  QP/QS 0.86. LV: Global hypokinesis, EF 35%.  EDP markedly elevated at 32 mmHg.  There was no pressure gradient across the aortic valve. Left main: Mild disease. LAD: Mild diffuse disease. CX: Mild diffuse disease. RI: Mild diffuse disease. RCA: Dominant, mild diffuse disease.  Impression: Nonischemic cardiomyopathy with severe LV systolic dysfunction.  Mild to moderate pulmonary hypertension secondary to diastolic dysfunction with LV EDP elevation.  Recommendation: Evaluation for nonischemic cardiomyopathy, aggressive diuresis.  55 mL contrast utilized.  Scheduled Meds: . aspirin EC  81 mg Oral Daily  . dapagliflozin propanediol  10 mg Oral Daily  . docusate sodium  100 mg Oral BID  . DULoxetine  120 mg Oral Daily  . enoxaparin (LOVENOX) injection  40 mg Subcutaneous Q24H  . ezetimibe  10 mg Oral Daily  . insulin aspart  0-15 Units Subcutaneous TID WC  . insulin aspart  0-5 Units Subcutaneous QHS  . insulin glargine  38 Units Subcutaneous Daily  . levothyroxine  75 mcg Oral Q0600  .  metoprolol succinate  100 mg Oral Daily  . pravastatin  80 mg Oral QPM  . sacubitril-valsartan  1 tablet Oral BID  . sodium chloride flush  3 mL Intravenous Q12H  . spironolactone  25 mg Oral Daily    Continuous Infusions: . sodium chloride      PRN Meds: sodium chloride, acetaminophen **OR** acetaminophen, albuterol, ALPRAZolam, bisacodyl, morphine injection, ondansetron **OR** ondansetron (ZOFRAN) IV, oxyCODONE, polyethylene glycol, sodium chloride flush   IMPRESSION & RECOMMENDATIONS: Felicia Hanson is a 65 y.o. female whose past medical history and cardiac risk factors include:  insulin-dependent type 2 diabetes with hyperglycemia, pure hypercholesterolemia, hypertriglyceridemia, coronary artery calcification, newly diagnosed left bundle branch block, essential hypertension, hypothyroidism, tobacco use disorder, vitamin D deficiency, generalized anxiety disorder.  Impression: Acute combined systolic and diastolic heart failure, stage C, NYHA class II/III: improving  Nonischemic cardiomyopathy.  Acute respiratory distress requiring BiPAP intervention: Resolved  Elevated troponins most likely secondary to supply demand ischemia in the setting of acute respiratory distress and heart failure exacerbation.  Calcified atherosclerotic coronary artery disease.  Left bundle branch block.  Insulin-dependent diabetes mellitus type 2  Benign essential hypertension with chronic kidney disease stage III  Hypertriglyceridemia.  Active tobacco use  Plan:  Patient symptoms of chest pain and dyspnea have resolved at this time.  She has diuresed well since admission.  Underwent left and right heart catheterization yesterday.  Heart catheterization revealed mild nonobstructive coronary artery disease with global hypokinesis of the left ventricle LVEF 35%.  Heart catheterization consistent with nonischemic cardiomyopathy with systolic dysfunction and mild to moderate pulmonary  hypertension.   Patient appears euvolemic on exam this morning, suspect I&O's are not well documented as patient admits she has not notified nursing staff with each void.  Continue strict I&O and daily weight.  [facility], renal function electrolytes remain stable.  Continue Toprol-XL, Entresto, pravastatin, aspirin, and spironolactone.  We will discontinue IV furosemide and start patient on Farxiga mg p.o. daily.  Patient's LDL at 75 is above goal of <70 mg/dL, will continue maximally tolerated pravastatin and add Zetia 10 mg orally daily.   Again discussed with patient regarding diet and lifestyle modifications as well as smoking cessation to reduce cardiovascular risk factors.   Patient was seen in collaboration with Dr. Odis Hollingsheadolia. He also reviewed patient's chart and examined the patient. Dr. Odis Hollingsheadolia is in agreement of the plan.   This note was created using a voice recognition software  as a result there may be grammatical errors inadvertently enclosed that do not reflect the nature of this encounter. Every attempt is made to correct such errors.   Rayford Halsted, PA-C 01/23/2021, 12:48 PM Office: [phone]  ADDENDUM:   Shared Visit: Patient was independently interviewed and examined by me. This has been a shared visit with [place], PA in consultation with Dr. Odis Hollingshead. I reviewed the above and agree the findings and recommendations, unless noted differently below.    PHYSICAL EXAM: Vitals with BMI 01/23/2021 01/23/2021 01/23/2021  Height - - -  Weight - - 168 lbs 10 oz  BMI - - 32.94  Systolic 158 136 433  Diastolic 78 75 67  Pulse 75 68 74   CONSTITUTIONAL:Appears older than stated age, hemodynamically stable,well-nourished. No acute distress.  SKIN: Skin is warm and dry. No cyanosis. No pallor. No jaundice HEAD: Normocephalic and atraumatic.  EYES: No scleral icterus MOUTH/THROAT:Moistoral membranes.  NECK: No JVD present. No thyromegaly  noted. No carotid bruits  LYMPHATIC: No visible cervical adenopathy.  CHEST Normal respiratory effort. No intercostal retractions  LUNGS:Decreased breath sounds bilaterally. No ralesno stridor. No wheezes.  CARDIOVASCULAR:Regular, positive S1-S2, no murmurs rubs or gallops appreciated. ABDOMINAL:Obese, soft, nontender, nondistended, positive bowel sounds in all 4 quadrants, no apparent ascites.  EXTREMITIES:Trace bilaterallyperipheral edema,tender to touch. HEMATOLOGIC: No significant bruising NEUROLOGIC: Oriented to person, place, and time. Nonfocal. Normal muscle tone.  PSYCHIATRIC: Normal mood and affect. Normal behavior. Cooperative    Assessment/Plan: Heart failure with reduced EF, stage C, NYHA class II/III: Currently on Toprol-XL, Entresto, spironolactone. Initiated [place] earlier today. Bumex 1 mg IV push x1 We will check magnesium. I suspect that the strict I's and O's are not being documented correctly as clinically patient is more euvolemic compared to admission.  I suspect that she may need additional diuretics but would like to uptitrate Entresto on Farxiga to maximally tolerated doses prior to adding loop diuretic. Continue telemetry. If the patient remains stable consider discharge in the next 24 hours.  With close follow-up.  This note was created using a voice recognition software as a result there may be grammatical errors inadvertently enclosed that do not reflect the nature of this encounter. Every attempt is made to correct such errors.   Tessa Lerner, [place], [facility]  Pager: [phone] Office: [phone]

## 2021-01-23 NOTE — Progress Notes (Signed)
Heart Failure Stewardship Pharmacist Progress Note   PCP: Shade Flood, MD PCP-Cardiologist: No primary care provider on file.    HPI:  65 yo F with PMH of hypothyroidism, HTN, HLD, T2DM, CAD, depression and anxiety. She presented to the ED on 01/20/21 with respiratory distress. She was admitted for acute on chronic combined systolic and diastolic heart failure. An ECHO was done on 01/20/21 and LVEF was 35-40% (was 21% on stress test in June 2021). R/LHC done on 01/22/21 and found to have NICM with mild to moderate pulmonary hypertension.  Current HF Medications: Metoprolol XL 100 mg daily Entresto 49/51 mg BID Spironolactone 25 mg daily Farxiga 10 mg daily  Prior to admission HF Medications: Losartan 50 mg daily Metoprolol tartrate 50 mg BID  Pertinent Lab Values: . Serum creatinine 0.90, BUN 18, Potassium 4.4, Sodium 137, BNP 206.1, Magnesium 2.0   Vital Signs: . Weight: 168 lbs (admission weight: 167 lbs) . Blood pressure: 140-150/70s  . Heart rate: 70-80s  Medication Assistance / Insurance Benefits Check: Does the patient have prescription insurance?  Yes Type of insurance plan: UHC Medicare  Does the patient qualify for medication assistance through manufacturers or grants?   Pending . Eligible grants and/or patient assistance programs: pending . Medication assistance applications in progress: none  . Medication assistance applications approved: none Approved medication assistance renewals will be completed by: TBD  Outpatient Pharmacy:  Prior to admission outpatient [facility] Is the patient willing to use [facility] TOC pharmacy at discharge? Yes Is the patient willing to transition their outpatient pharmacy to utilize a [facility] outpatient pharmacy?   Pending   Assessment: 1. Acute on chronic combined systolic and diastolic CHF (EF 88-41%). R/LHC today. NYHA class II symptoms. - Continue metoprolol XL 100 mg daily - Continue Entresto 49/51 mg BID - Continue  spironolactone 25 mg daily - Agree with starting Farxiga 10 mg daily now that she is off IV lasix   Plan: 1) Medication changes recommended at this time: - Continue current regimen; if BP still up in AM, consider increasing dose of Entresto to 97/103 mg BID  2) Patient assistance: Sherryll Burger copay: $47 per month - Farxiga or Jardiance copay: $47 per month - Will help her enroll in patient assistance to bring copays down to $0 a month for both Entresto and Farxiga (if this is added prior to discharge)  3)  Education  - To be completed prior to discharge  Sharen Hones, PharmD, BCPS Heart Failure Engineer, building services Phone [phone]

## 2021-01-23 NOTE — Progress Notes (Signed)
PROGRESS NOTE    Felicia Hanson  TJQ:[id number] DOB: 1956-11-16 DOA: 01/20/2021 PCP: Shade Flood, MD   Brief Narrative: 65 year old with past medical history significant for hypothyroidism, hypertension, hyperlipidemia, diabetes, CAD, depression anxiety.  Patient presented with respiratory distress and found to have associated hypoxia requiring the use of BiPAP in the setting of acute on chronic heart failure exacerbation.  Patient was treated with IV Lasix with improvement of her respiratory status.  Patient was found to have ejection fraction of 35 to 40%, cardiology is planning cardiac cath 2/23    Assessment & Plan:   Principal Problem:   Acute on chronic combined systolic and diastolic CHF (congestive heart failure) (HCC) Active Problems:   Generalized anxiety disorder   Essential hypertension, benign   Pure hypercholesterolemia   Hypothyroidism   Type 2 diabetes mellitus without complication, without long-term current use of insulin (HCC)   Respiratory distress   Atypical chest pain   1-Acute Hypoxic Respiratory Failure, Acute Respiratory Distress: Secondary to acute on chronic combined systolic and diastolic heart failure exacerbation -Chest x-ray concerning for infection/inflammatory versus asymmetric anemia.  Likely asymmetric edema. -Was treated with IV Lasix.  She initially required BiPAP during hospitalization.  She is currently on room air. -IV lasix stopped. Transition to [place] today.   Acute on chronic combined systolic and diastolic heart failure: Echo show ejection fraction 35 to 40% indeterminate diastolic filling. Cardiology consulted plan for heart cath Continue with , spironolactone, entresto Monitor weight.   Hypertension: Continue with losartan, metoprolol and spironolactone  Hyperlipidemia: Continue with pravastatin  Diabetes mellitus Type 2: Hemoglobin A1c 6.8. Continue with Lantus and sliding scale Resume Trulicity at  discharge  Leukocytosis trending down. Received Steroid by EMS> . Trending down.  CKD stage 2. Monitor renal function. Correction pateint is CKD stage 2 not 3  Estimated body mass index is 32.94 kg/m as calculated from the following:   Height as of this encounter: 5' (1.524 m).   Weight as of this encounter: 76.5 kg.   DVT prophylaxis: Lovenox Code Status: Full code Family Communication: care discussed with patient.  Disposition Plan:  Status is: Inpatient  Remains inpatient appropriate because:Ongoing diagnostic testing needed not appropriate for outpatient work up   Dispo: The patient is from: Home              Anticipated d/c is to: Home              Anticipated d/c date is: 1 day              Patient currently is not medically stable to d/c.plan to observe overnight on Farxiga.    Difficult to place patient No        Consultants:   Cardiology   Procedures:   Cath 2/23  Antimicrobials:  None  Subjective: She feels sleepy today. Had some xanax last night. She denies dyspnea or chest pain  Objective: Vitals:   01/23/21 0023 01/23/21 0509 01/23/21 0735 01/23/21 1143  BP: (!) 150/72 (!) 148/67 136/75 (!) 158/78  Pulse: 82 74 68 75  Resp: 17  14 14   Temp: 98 F (36.7 C) 97.8 F (36.6 C) (!) 97.4 F (36.3 C) 98.3 F (36.8 C)  TempSrc: Oral Oral Oral Oral  SpO2: 96% 97% 95% 94%  Weight:  76.5 kg    Height:        Intake/Output Summary (Last 24 hours) at 01/23/2021 1501 Last data filed at 01/23/2021 0600 Gross per 24 hour  Intake 1044.87 ml  Output 950 ml  Net 94.87 ml   Filed Weights   01/21/21 1543 01/22/21 0429 01/23/21 0509  Weight: 76 kg 75.7 kg 76.5 kg    Examination:  General exam: NAD Respiratory system: CTA Cardiovascular system: S 1, S 2 RRR Gastrointestinal system: BS present, soft, nt Central nervous system: alert Extremities: no edema    Data Reviewed: I have personally reviewed following labs and imaging  studies  CBC: Recent Labs  Lab 01/20/21 0145 01/20/21 0220 01/21/21 0916 01/22/21 0240 01/22/21 1520 01/22/21 1523 01/22/21 1535 01/23/21 0921  WBC 17.8*  --  20.0* 16.5*  --   --   --  13.9*  NEUTROABS 9.3*  --   --   --   --   --   --   --   HGB 15.7*   < > 14.4 14.3 13.9 13.9 13.9 15.2*  HCT 49.4*   < > 43.9 42.7 41.0 41.0 41.0 44.3  MCV 92.5  --  88.5 89.1  --   --   --  87.5  PLT 234  --  228 174  --   --   --  201   < > = values in this interval not displayed.   Basic Metabolic Panel: Recent Labs  Lab 01/20/21 0145 01/20/21 0220 01/20/21 0356 01/21/21 0500 01/21/21 1746 01/22/21 0240 01/22/21 1520 01/22/21 1523 01/22/21 1535 01/23/21 0824  NA 135   < >  --  136 140 138 139 139 139 137  K 3.5   < >  --  4.2 5.3* 4.9 3.8 3.8 3.8 4.4  CL 98  --   --  98 100 97*  --   --   --  99  CO2 17*  --   --  25 24 26   --   --   --  26  GLUCOSE 236*  --   --  158* 271* 197*  --   --   --  160*  BUN 8  --   --  19 21 20   --   --   --  18  CREATININE 1.29*  --   --  1.12* 1.29* 1.06*  --   --   --  0.90  CALCIUM 8.6*  --   --  9.0 9.7 9.4  --   --   --  8.7*  MG 5.6*  --  2.5* 2.0  --   --   --   --   --   --    < > = values in this interval not displayed.   GFR: Estimated Creatinine Clearance: 56.2 mL/min (by C-G formula based on SCr of 0.9 mg/dL). Liver Function Tests: Recent Labs  Lab 01/20/21 0145  AST 37  ALT 18  ALKPHOS 68  BILITOT 1.0  PROT 7.3  ALBUMIN 3.2*   No results for input(s): LIPASE, AMYLASE in the last 168 hours. No results for input(s): AMMONIA in the last 168 hours. Coagulation Profile: No results for input(s): INR, PROTIME in the last 168 hours. Cardiac Enzymes: No results for input(s): CKTOTAL, CKMB, CKMBINDEX, TROPONINI in the last 168 hours. BNP (last 3 results) No results for input(s): PROBNP in the last 8760 hours. HbA1C: No results for input(s): HGBA1C in the last 72 hours. CBG: Recent Labs  Lab 01/22/21 2147 01/23/21 0026  01/23/21 0514 01/23/21 0740 01/23/21 1145  GLUCAP 225* 215* 182* 169* 260*   Lipid Profile: No results for input(s): CHOL, HDL, LDLCALC,  TRIG, CHOLHDL, LDLDIRECT in the last 72 hours. Thyroid Function Tests: No results for input(s): TSH, T4TOTAL, FREET4, T3FREE, THYROIDAB in the last 72 hours. Anemia Panel: No results for input(s): VITAMINB12, FOLATE, FERRITIN, TIBC, IRON, RETICCTPCT in the last 72 hours. Sepsis Labs: Recent Labs  Lab 01/20/21 0651  PROCALCITON 0.40    Recent Results (from the past 240 hour(s))  Resp Panel by RT-PCR (Flu A&B, Covid) Nasopharyngeal Swab     Status: None   Collection Time: 01/20/21  2:05 AM   Specimen: Nasopharyngeal Swab; Nasopharyngeal(NP) swabs in vial transport medium  Result Value Ref Range Status   SARS Coronavirus 2 by RT PCR NEGATIVE NEGATIVE Final    Comment: (NOTE) SARS-CoV-2 target nucleic acids are NOT DETECTED.  The SARS-CoV-2 RNA is generally detectable in upper respiratory specimens during the acute phase of infection. The lowest concentration of SARS-CoV-2 viral copies this assay can detect is 138 copies/mL. A negative result does not preclude SARS-Cov-2 infection and should not be used as the sole basis for treatment or other patient management decisions. A negative result may occur with  improper specimen collection/handling, submission of specimen other than nasopharyngeal swab, presence of viral mutation(s) within the areas targeted by this assay, and inadequate number of viral copies(<138 copies/mL). A negative result must be combined with clinical observations, patient history, and epidemiological information. The expected result is Negative.  Fact Sheet for Patients:  BloggerCourse.com  Fact Sheet for Healthcare Providers:  SeriousBroker.it  This test is no t yet approved or cleared by the [place] FDA and  has been authorized for detection and/or diagnosis of  SARS-CoV-2 by FDA under an Emergency Use Authorization (EUA). This EUA will remain  in effect (meaning this test can be used) for the duration of the COVID-19 declaration under Section 564(b)(1) of the Act, 21 U.S.C.section 360bbb-3(b)(1), unless the authorization is terminated  or revoked sooner.       Influenza A by PCR NEGATIVE NEGATIVE Final   Influenza B by PCR NEGATIVE NEGATIVE Final    Comment: (NOTE) The Xpert Xpress SARS-CoV-2/FLU/RSV plus assay is intended as an aid in the diagnosis of influenza from Nasopharyngeal swab specimens and should not be used as a sole basis for treatment. Nasal washings and aspirates are unacceptable for Xpert Xpress SARS-CoV-2/FLU/RSV testing.  Fact Sheet for Patients: BloggerCourse.com  Fact Sheet for Healthcare Providers: SeriousBroker.it  This test is not yet approved or cleared by the [place] FDA and has been authorized for detection and/or diagnosis of SARS-CoV-2 by FDA under an Emergency Use Authorization (EUA). This EUA will remain in effect (meaning this test can be used) for the duration of the COVID-19 declaration under Section 564(b)(1) of the Act, 21 U.S.C. section 360bbb-3(b)(1), unless the authorization is terminated or revoked.  Performed at [facility], [address], [place], [place] [address]          Radiology Studies: CARDIAC CATHETERIZATION  Result Date: 01/22/2021 Right + left heart catheterization 01/22/2021: RA 16/12, mean 11 mmHg, RA saturation 80%. RV 47/8, EDP 15 mmHg. PA 41/26, mean 33 mmHg.  PA saturation 77%. PW 32/26, mean 29 mmHg.  Aortic saturation 98%. Cardiac output 6.58, cardiac index 3.80 by Fick.  Stroke-volume 70 mL.  SVR 16.87, PVR 1.05.  QP/QS 0.86. LV: Global hypokinesis, EF 35%.  EDP markedly elevated at 32 mmHg.  There was no pressure gradient across the aortic valve. Left main: Mild disease. LAD: Mild diffuse disease. CX: Mild  diffuse disease. RI: Mild diffuse  disease. RCA: Dominant, mild diffuse disease. Impression: Nonischemic cardiomyopathy with severe LV systolic dysfunction.  Mild to moderate pulmonary hypertension secondary to diastolic dysfunction with LV EDP elevation. Recommendation: Evaluation for nonischemic cardiomyopathy, aggressive diuresis.  55 mL contrast utilized.        Scheduled Meds: . aspirin EC  81 mg Oral Daily  . dapagliflozin propanediol  10 mg Oral Daily  . docusate sodium  100 mg Oral BID  . DULoxetine  120 mg Oral Daily  . enoxaparin (LOVENOX) injection  40 mg Subcutaneous Q24H  . ezetimibe  10 mg Oral Daily  . insulin aspart  0-15 Units Subcutaneous TID WC  . insulin aspart  0-5 Units Subcutaneous QHS  . insulin glargine  38 Units Subcutaneous Daily  . levothyroxine  75 mcg Oral Q0600  . metoprolol succinate  100 mg Oral Daily  . pravastatin  80 mg Oral QPM  . sacubitril-valsartan  1 tablet Oral BID  . sodium chloride flush  3 mL Intravenous Q12H  . spironolactone  25 mg Oral Daily   Continuous Infusions: . sodium chloride       LOS: 3 days    Time spent: 35 minutes.     Alba Cory, MD [facility]   If 7PM-7AM, please contact night-coverage [email or web address]  01/23/2021, 3:01 PM

## 2021-01-23 NOTE — Care Management (Signed)
1009 01-23-21 Benefits check submitted for Entresto. Case Manager will follow for cost. Graves-Bigelow, Lamar Laundry, RN,BSN Case Manager

## 2021-01-23 NOTE — TOC Benefit Eligibility Note (Signed)
Transition of Care [facility]) Benefit Eligibility Note    Patient Details  Name: Felicia Hanson MRN: [id number] Date of Birth: 1956-04-27   Medication/Dose: Sherryll Burger  49-51- MG BID  Covered?: Yes  Tier: 3 Drug  Prescription Coverage [facility]erred [facility]  Spoke with Person/Company/Phone Number:: JASMIN @ [employer] # [phone]  Co-Pay: $47.00  Prior Approval: No  Deductible: Unmet (OUT-OF-POCKET:UNMET)       Mardene Sayer Phone Number: 01/23/2021, 10:50 AM

## 2021-01-23 NOTE — Plan of Care (Signed)
  Problem: Education: Goal: Knowledge of General Education information will improve Description: Including pain rating scale, medication(s)/side effects and non-pharmacologic comfort measures Outcome: Progressing   Problem: Health Behavior/Discharge Planning: Goal: Ability to manage health-related needs will improve Outcome: Progressing   Problem: Clinical Measurements: Goal: Ability to maintain clinical measurements within normal limits will improve Outcome: Progressing Goal: Will remain free from infection Outcome: Progressing Goal: Diagnostic test results will improve Outcome: Progressing Goal: Respiratory complications will improve Outcome: Progressing Goal: Cardiovascular complication will be avoided Outcome: Progressing   Problem: Activity: Goal: Risk for activity intolerance will decrease Outcome: Progressing   Problem: Nutrition: Goal: Adequate nutrition will be maintained Outcome: Progressing   Problem: Coping: Goal: Level of anxiety will decrease Outcome: Progressing   Problem: Elimination: Goal: Will not experience complications related to bowel motility Outcome: Progressing Goal: Will not experience complications related to urinary retention Outcome: Progressing   Problem: Pain Managment: Goal: General experience of comfort will improve Outcome: Progressing   Problem: Safety: Goal: Ability to remain free from injury will improve Outcome: Progressing   Problem: Skin Integrity: Goal: Risk for impaired skin integrity will decrease Outcome: Progressing   Problem: Education: Goal: Understanding of CV disease, CV risk reduction, and recovery process will improve Outcome: Progressing Goal: Individualized Educational Video(s) Outcome: Progressing   Problem: Activity: Goal: Ability to return to baseline activity level will improve Outcome: Progressing   Problem: Cardiovascular: Goal: Ability to achieve and maintain adequate cardiovascular perfusion  will improve Outcome: Progressing Goal: Vascular access site(s) Level 0-1 will be maintained Outcome: Progressing   Problem: Health Behavior/Discharge Planning: Goal: Ability to safely manage health-related needs after discharge will improve Outcome: Progressing   Problem: Education: Goal: Ability to demonstrate management of disease process will improve Outcome: Progressing Goal: Ability to verbalize understanding of medication therapies will improve Outcome: Progressing Goal: Individualized Educational Video(s) Outcome: Progressing   Problem: Activity: Goal: Capacity to carry out activities will improve Outcome: Progressing   Problem: Cardiac: Goal: Ability to achieve and maintain adequate cardiopulmonary perfusion will improve Outcome: Progressing   

## 2021-01-24 ENCOUNTER — Other Ambulatory Visit: Payer: Self-pay | Admitting: Internal Medicine

## 2021-01-24 LAB — CBC
HCT: 44.9 % (ref 36.0–46.0)
Hemoglobin: 15.7 g/dL — ABNORMAL HIGH (ref 12.0–15.0)
MCH: 29.8 pg (ref 26.0–34.0)
MCHC: 35 g/dL (ref 30.0–36.0)
MCV: 85.4 fL (ref 80.0–100.0)
Platelets: 221 10*3/uL (ref 150–400)
RBC: 5.26 MIL/uL — ABNORMAL HIGH (ref 3.87–5.11)
RDW: 13.2 % (ref 11.5–15.5)
WBC: 15.3 10*3/uL — ABNORMAL HIGH (ref 4.0–10.5)
nRBC: 0 % (ref 0.0–0.2)

## 2021-01-24 LAB — BASIC METABOLIC PANEL
Anion gap: 12 (ref 5–15)
Anion gap: 14 (ref 5–15)
BUN: 15 mg/dL (ref 8–23)
BUN: 16 mg/dL (ref 8–23)
CO2: 31 mmol/L (ref 22–32)
CO2: 29 mmol/L (ref 22–32)
Calcium: 8.8 mg/dL — ABNORMAL LOW (ref 8.9–10.3)
Calcium: 8.8 mg/dL — ABNORMAL LOW (ref 8.9–10.3)
Chloride: 94 mmol/L — ABNORMAL LOW (ref 98–111)
Chloride: 94 mmol/L — ABNORMAL LOW (ref 98–111)
Creatinine, Ser: 1.03 mg/dL — ABNORMAL HIGH (ref 0.44–1.00)
Creatinine, Ser: 0.99 mg/dL (ref 0.44–1.00)
GFR, Estimated: 60 mL/min — ABNORMAL LOW (ref >60)
GFR, Estimated: >60 mL/min (ref >60)
Glucose, Bld: 111 mg/dL — ABNORMAL HIGH (ref 70–99)
Glucose, Bld: 142 mg/dL — ABNORMAL HIGH (ref 70–99)
Potassium: 2.7 mmol/L — CL (ref 3.5–5.1)
Potassium: 3.3 mmol/L — ABNORMAL LOW (ref 3.5–5.1)
Sodium: 137 mmol/L (ref 135–145)
Sodium: 137 mmol/L (ref 135–145)

## 2021-01-24 LAB — GLUCOSE, CAPILLARY
Glucose-Capillary: 112 mg/dL — ABNORMAL HIGH (ref 70–99)
Glucose-Capillary: 298 mg/dL — ABNORMAL HIGH (ref 70–99)
Glucose-Capillary: 134 mg/dL — ABNORMAL HIGH (ref 70–99)

## 2021-01-24 MED ORDER — POTASSIUM CHLORIDE CRYS ER 20 MEQ PO TBCR
40.0000 meq | EXTENDED_RELEASE_TABLET | Freq: Once | ORAL | Status: AC
  Administered 2021-01-24: 40 meq via ORAL
  Filled 2021-01-24: qty 2

## 2021-01-24 MED ORDER — DAPAGLIFLOZIN PROPANEDIOL 10 MG PO TABS: 10.0000 mg | ORAL_TABLET | Freq: Every day | ORAL | 3 refills | Status: DC

## 2021-01-24 MED ORDER — SACUBITRIL-VALSARTAN 97-103 MG PO TABS: 1.0000 | ORAL_TABLET | Freq: Two times a day (BID) | ORAL | 3 refills | Status: DC

## 2021-01-24 MED ORDER — POTASSIUM CHLORIDE 10 MEQ/100ML IV SOLN
10.0000 meq | INTRAVENOUS | Status: AC
Start: 2021-01-24 — End: 2021-01-24
  Administered 2021-01-24 (×2): 10 meq via INTRAVENOUS
  Filled 2021-01-24 (×2): qty 100

## 2021-01-24 MED ORDER — POTASSIUM CHLORIDE CRYS ER 20 MEQ PO TBCR: 40.0000 meq | EXTENDED_RELEASE_TABLET | Freq: Once | ORAL | Status: DC

## 2021-01-24 MED ORDER — POTASSIUM CHLORIDE CRYS ER 20 MEQ PO TBCR: 20.0000 meq | EXTENDED_RELEASE_TABLET | Freq: Every day | ORAL | 0 refills | Status: DC

## 2021-01-24 MED ORDER — SPIRONOLACTONE 25 MG PO TABS: 25.0000 mg | ORAL_TABLET | Freq: Every day | ORAL | 3 refills | Status: DC

## 2021-01-24 MED ORDER — MAGNESIUM OXIDE 400 (241.3 MG) MG PO TABS: 200.0000 mg | ORAL_TABLET | Freq: Two times a day (BID) | ORAL | 0 refills | Status: DC

## 2021-01-24 MED ORDER — SACUBITRIL-VALSARTAN 97-103 MG PO TABS
1.0000 | ORAL_TABLET | Freq: Two times a day (BID) | ORAL | Status: DC
  Filled 2021-01-24: qty 1

## 2021-01-24 MED ORDER — EZETIMIBE 10 MG PO TABS: 10.0000 mg | ORAL_TABLET | Freq: Every day | ORAL | 2 refills | Status: DC

## 2021-01-24 MED ORDER — MAGNESIUM OXIDE 400 (241.3 MG) MG PO TABS
200.0000 mg | ORAL_TABLET | Freq: Two times a day (BID) | ORAL | Status: DC
  Administered 2021-01-24: 200 mg via ORAL
  Filled 2021-01-24: qty 1

## 2021-01-24 MED ORDER — METOPROLOL SUCCINATE ER 100 MG PO TB24: 100.0000 mg | ORAL_TABLET | Freq: Every day | ORAL | 3 refills | Status: DC

## 2021-01-24 MED ORDER — MAGNESIUM SULFATE 2 GM/50ML IV SOLN
2.0000 g | Freq: Once | INTRAVENOUS | Status: AC
  Administered 2021-01-24: 2 g via INTRAVENOUS
  Filled 2021-01-24: qty 50

## 2021-01-24 MED FILL — EZETIMIBE 10 MG TABS: 10 | 30 days supply | Qty: 30 | Fill #0

## 2021-01-24 MED FILL — SPIRONOLACTONE 25 MG TABLET: 25 | 30 days supply | Qty: 30 | Fill #0

## 2021-01-24 MED FILL — MAGNESIUM OXIDE 400 MG TABS: 400 | 10 days supply | Qty: 10 | Fill #0

## 2021-01-24 MED FILL — ENTRESTO 97 MG-103 MG TAB: 97-103 | 30 days supply | Qty: 60 | Fill #0

## 2021-01-24 MED FILL — METOPROLOL SUCCINATE ER 100: 100 | 30 days supply | Qty: 30 | Fill #0

## 2021-01-24 MED FILL — POTASSIUM CHLORIDE 20meqER: 20 | 2 days supply | Qty: 2 | Fill #0

## 2021-01-24 MED FILL — FARXIGA 10 MG TABLET: 10 | 30 days supply | Qty: 30 | Fill #0

## 2021-01-24 NOTE — Progress Notes (Signed)
Heart Failure Stewardship Pharmacist Progress Note   PCP: Shade Flood, MD PCP-Cardiologist: No primary care provider on file.    HPI:  65 yo F with PMH of hypothyroidism, HTN, HLD, T2DM, CAD, depression and anxiety. She presented to the ED on 01/20/21 with respiratory distress. She was admitted for acute on chronic combined systolic and diastolic heart failure. An ECHO was done on 01/20/21 and LVEF was 35-40% (was 21% on stress test in June 2021). R/LHC done on 01/22/21 and found to have NICM with mild to moderate pulmonary hypertension.  Current HF Medications: Metoprolol XL 100 mg daily Entresto 97/103 mg BID Spironolactone 25 mg daily Farxiga 10 mg daily  Prior to admission HF Medications: Losartan 50 mg daily Metoprolol tartrate 50 mg BID  Pertinent Lab Values: . Serum creatinine 1.03, BUN 15, Potassium 2.7, Sodium 137, BNP 206.1, Magnesium 1.7  Vital Signs: . Weight: 170 lbs (admission weight: 167 lbs) . Blood pressure: 150-170/70s  . Heart rate: 70s  Medication Assistance / Insurance Benefits Check: Does the patient have prescription insurance?  Yes Type of insurance plan: UHC Medicare  Does the patient qualify for medication assistance through manufacturers or grants?   Pending . Eligible grants and/or patient assistance programs: [place] . Medication assistance applications in progress: none  . Medication assistance applications approved: none Approved medication assistance renewals will be completed by: TBD  Outpatient Pharmacy:  [facility] Is the patient willing to use [facility] TOC pharmacy at discharge? Yes Is the patient willing to transition their outpatient pharmacy to utilize a [facility] outpatient pharmacy?   Pending   Assessment: 1. Acute on chronic combined systolic and diastolic CHF (EF 16-10%). R/LHC today. NYHA class II symptoms. - Continue metoprolol XL 100 mg daily - Agree with increasing Entresto to 97/103  mg BID - Continue spironolactone 25 mg daily - Continue Farxiga 10 mg daily   Plan: 1) Medication changes recommended at this time: - Agree with increasing Entresto - Consider ruling out renal artery stenosis given uncontrolled HTN on high doses of anti-hypertensive medications  2) Patient assistance: Sherryll Burger copay: $47 per month - Farxiga or Jardiance copay: $47 per month - Will help her enroll in patient assistance to bring copays down to $0 a month for both Entresto and Farxiga   3)  Education  - To be completed prior to discharge  Sharen Hones, PharmD, BCPS Heart Failure Engineer, building services Phone [phone]

## 2021-01-24 NOTE — Discharge Instructions (Signed)
Femoral Site Care  This sheet gives you information about how to care for yourself after your procedure. Your health care provider may also give you more specific instructions. If you have problems or questions, contact your health care provider. What can I expect after the procedure? After the procedure, it is common to have:  Bruising that usually fades within 1-2 weeks.  Tenderness at the site. Follow these instructions at home: Wound care  Follow instructions from your health care provider about how to take care of your insertion site. Make sure you: ? Wash your hands with soap and water before you change your bandage (dressing). If soap and water are not available, use hand sanitizer. ? Change your dressing as told by your health care provider. ? Leave stitches (sutures), skin glue, or adhesive strips in place. These skin closures may need to stay in place for 2 weeks or longer. If adhesive strip edges start to loosen and curl up, you may trim the loose edges. Do not remove adhesive strips completely unless your health care provider tells you to do that.  Do not take baths, swim, or use a hot tub until your health care provider approves.  You may shower 24-48 hours after the procedure or as told by your health care provider. ? Gently wash the site with plain soap and water. ? Pat the area dry with a clean towel. ? Do not rub the site. This may cause bleeding.  Do not apply powder or lotion to the site. Keep the site clean and dry.  Check your femoral site every day for signs of infection. Check for: ? Redness, swelling, or pain. ? Fluid or blood. ? Warmth. ? Pus or a bad smell. Activity  For the first 2-3 days after your procedure, or as long as directed: ? Avoid climbing stairs as much as possible. ? Do not squat.  Do not lift anything that is heavier than 10 lb (4.5 kg), or the limit that you are told, until your health care provider says that it is safe.  Rest as  directed. ? Avoid sitting for a long time without moving. Get up to take short walks every 1-2 hours.  Do not drive for 24 hours if you were given a medicine to help you relax (sedative). General instructions  Take over-the-counter and prescription medicines only as told by your health care provider.  Keep all follow-up visits as told by your health care provider. This is important. Contact a health care provider if you have:  A fever or chills.  You have redness, swelling, or pain around your insertion site. Get help right away if:  The catheter insertion area swells very fast.  You pass out.  You suddenly start to sweat or your skin gets clammy.  The catheter insertion area is bleeding, and the bleeding does not stop when you hold steady pressure on the area.  The area near or just beyond the catheter insertion site becomes pale, cool, tingly, or numb. These symptoms may represent a serious problem that is an emergency. Do not wait to see if the symptoms will go away. Get medical help right away. Call your local emergency services (911 in the [place] Do not drive yourself to the hospital. Summary  After the procedure, it is common to have bruising that usually fades within 1-2 weeks.  Check your femoral site every day for signs of infection.  Do not lift anything that is heavier than 10 lb (4.5 kg), or   the limit that you are told, until your health care provider says that it is safe. This information is not intended to replace advice given to you by your health care provider. Make sure you discuss any questions you have with your health care provider. Document Revised: 07/19/2020 Document Reviewed: 07/19/2020 Elsevier Patient Education  2021 Elsevier Inc.    Diabetes Mellitus and Nutrition, Adult When you have diabetes, or diabetes mellitus, it is very important to have healthy eating habits because your blood sugar (glucose) levels are greatly affected by what you eat and  drink. Eating healthy foods in the right amounts, at about the same times every day, can help you:  Control your blood glucose.  Lower your risk of heart disease.  Improve your blood pressure.  Reach or maintain a healthy weight. What can affect my meal plan? Every person with diabetes is different, and each person has different needs for a meal plan. Your health care provider may recommend that you work with a dietitian to make a meal plan that is best for you. Your meal plan may vary depending on factors such as:  The calories you need.  The medicines you take.  Your weight.  Your blood glucose, blood pressure, and cholesterol levels.  Your activity level.  Other health conditions you have, such as heart or kidney disease. How do carbohydrates affect me? Carbohydrates, also called carbs, affect your blood glucose level more than any other type of food. Eating carbs naturally raises the amount of glucose in your blood. Carb counting is a method for keeping track of how many carbs you eat. Counting carbs is important to keep your blood glucose at a healthy level, especially if you use insulin or take certain oral diabetes medicines. It is important to know how many carbs you can safely have in each meal. This is different for every person. Your dietitian can help you calculate how many carbs you should have at each meal and for each snack. How does alcohol affect me? Alcohol can cause a sudden decrease in blood glucose (hypoglycemia), especially if you use insulin or take certain oral diabetes medicines. Hypoglycemia can be a life-threatening condition. Symptoms of hypoglycemia, such as sleepiness, dizziness, and confusion, are similar to symptoms of having too much alcohol.  Do not drink alcohol if: ? Your health care provider tells you not to drink. ? You are pregnant, may be pregnant, or are planning to become pregnant.  If you drink alcohol: ? Do not drink on an empty  stomach. ? Limit how much you use to:  0-1 drink a day for women.  0-2 drinks a day for men. ? Be aware of how much alcohol is in your drink. In the [place] one drink equals one 12 oz bottle of beer (355 mL), one 5 oz glass of wine (148 mL), or one 1 oz glass of hard liquor (44 mL). ? Keep yourself hydrated with water, diet soda, or unsweetened iced tea.  Keep in mind that regular soda, juice, and other mixers may contain a lot of sugar and must be counted as carbs. What are tips for following this plan? Reading food labels  Start by checking the serving size on the "Nutrition Facts" label of packaged foods and drinks. The amount of calories, carbs, fats, and other nutrients listed on the label is based on one serving of the item. Many items contain more than one serving per package.  Check the total grams (g) of carbs in one  serving. You can calculate the number of servings of carbs in one serving by dividing the total carbs by 15. For example, if a food has 30 g of total carbs per serving, it would be equal to 2 servings of carbs.  Check the number of grams (g) of saturated fats and trans fats in one serving. Choose foods that have a low amount or none of these fats.  Check the number of milligrams (mg) of salt (sodium) in one serving. Most people should limit total sodium intake to less than 2,300 mg per day.  Always check the nutrition information of foods labeled as "low-fat" or "nonfat." These foods may be higher in added sugar or refined carbs and should be avoided.  Talk to your dietitian to identify your daily goals for nutrients listed on the label. Shopping  Avoid buying canned, pre-made, or processed foods. These foods tend to be high in fat, sodium, and added sugar.  Shop around the outside edge of the grocery store. This is where you will most often find fresh fruits and vegetables, bulk grains, fresh meats, and fresh dairy. Cooking  Use low-heat cooking methods, such as  baking, instead of high-heat cooking methods like deep frying.  Cook using healthy oils, such as olive, canola, or sunflower oil.  Avoid cooking with butter, cream, or high-fat meats. Meal planning  Eat meals and snacks regularly, preferably at the same times every day. Avoid going long periods of time without eating.  Eat foods that are high in fiber, such as fresh fruits, vegetables, beans, and whole grains. Talk with your dietitian about how many servings of carbs you can eat at each meal.  Eat 4-6 oz (112-168 g) of lean protein each day, such as lean meat, chicken, fish, eggs, or tofu. One ounce (oz) of lean protein is equal to: ? 1 oz (28 g) of meat, chicken, or fish. ? 1 egg. ?  cup (62 g) of tofu.  Eat some foods each day that contain healthy fats, such as avocado, nuts, seeds, and fish.   What foods should I eat? Fruits Berries. Apples. Oranges. Peaches. Apricots. Plums. Grapes. Mango. Papaya. Pomegranate. Kiwi. Cherries. Vegetables Lettuce. Spinach. Leafy greens, including kale, chard, collard greens, and mustard greens. Beets. Cauliflower. Cabbage. Broccoli. Carrots. Green beans. Tomatoes. Peppers. Onions. Cucumbers. Brussels sprouts. Grains Whole grains, such as whole-wheat or whole-grain bread, crackers, tortillas, cereal, and pasta. Unsweetened oatmeal. Quinoa. Brown or wild rice. Meats and other proteins Seafood. Poultry without skin. Lean cuts of poultry and beef. Tofu. Nuts. Seeds. Dairy Low-fat or fat-free dairy products such as milk, yogurt, and cheese. The items listed above may not be a complete list of foods and beverages you can eat. Contact a dietitian for more information. What foods should I avoid? Fruits Fruits canned with syrup. Vegetables Canned vegetables. Frozen vegetables with butter or cream sauce. Grains Refined white flour and flour products such as bread, pasta, snack foods, and cereals. Avoid all processed foods. Meats and other proteins Fatty  cuts of meat. Poultry with skin. Breaded or fried meats. Processed meat. Avoid saturated fats. Dairy Full-fat yogurt, cheese, or milk. Beverages Sweetened drinks, such as soda or iced tea. The items listed above may not be a complete list of foods and beverages you should avoid. Contact a dietitian for more information. Questions to ask a health care provider  Do I need to meet with a diabetes educator?  Do I need to meet with a dietitian?  What number can I  call if I have questions?  When are the best times to check my blood glucose? Where to find more information:  American Diabetes Association: diabetes.org  Academy of Nutrition and Dietetics: www.eatright.[employer] of Diabetes and Digestive and Kidney Diseases: CarFlippers.tn  Association of Diabetes Care and Education Specialists: www.diabeteseducator.org Summary  It is important to have healthy eating habits because your blood sugar (glucose) levels are greatly affected by what you eat and drink.  A healthy meal plan will help you control your blood glucose and maintain a healthy lifestyle.  Your health care provider may recommend that you work with a dietitian to make a meal plan that is best for you.  Keep in mind that carbohydrates (carbs) and alcohol have immediate effects on your blood glucose levels. It is important to count carbs and to use alcohol carefully. This information is not intended to replace advice given to you by your health care provider. Make sure you discuss any questions you have with your health care provider. Document Revised: 10/24/2019 Document Reviewed: 10/24/2019 Elsevier Patient Education  2021 [employer].

## 2021-01-24 NOTE — Progress Notes (Signed)
Progress Note  Patient Name: Felicia Hanson Date of Encounter: 01/24/2021  Attending physician: Alba Cory, MD Primary Physician:  Shade Flood, MD Outpatient Cardiologist: Tessa Lerner, DO, [facility] Inpatient Cardiologist: Tessa Lerner, DO, [facility], [place], [place] Referring physician: Dr. Jonah Blue   Subjective: Felicia Hanson is a 65 y.o. female who was seen and examined at bedside at 8 AM. Accompanied by her husband at bedside. Patient denies any chest pain or shortness of breath. Patient requesting to be discharged later today. She has diuresed approximately 5 L since admission.  Objective: Vital Signs in the last 24 hours: Temp:  [97.5 F (36.4 C)-98.3 F (36.8 C)] 98.1 F (36.7 C) (02/25 0802) Pulse Rate:  [72-75] 75 (02/25 0802) Resp:  [18-19] 18 (02/25 0802) BP: (140-173)/(68-76) 173/74 (02/25 0802) SpO2:  [98 %-99 %] 99 % (02/25 0802) Weight:  [77.4 kg] 77.4 kg (02/25 0627)  Intake/Output:  Intake/Output Summary (Last 24 hours) at 01/24/2021 1304 Last data filed at 01/24/2021 1216 Gross per 24 hour  Intake 150 ml  Output 4550 ml  Net -4400 ml    Net IO Since Admission: -5,155.13 mL [01/24/21 1304]  Weights:  Filed Weights   01/22/21 0429 01/23/21 0509 01/24/21 0627  Weight: 75.7 kg 76.5 kg 77.4 kg    Telemetry: Personally reviewed. Sinus rhythm with PVCs   Physical examination:  CONSTITUTIONAL:Appears older than stated age, hemodynamically stable,well-nourished. No acute distress.  SKIN: Skin is warm and dry. No cyanosis. No pallor. No jaundice HEAD: Normocephalic and atraumatic.  EYES: No scleral icterus MOUTH/THROAT:Moistoral membranes.  NECK: No JVD present. No thyromegaly noted. No carotid bruits  LYMPHATIC: No visible cervical adenopathy.  CHEST Normal respiratory effort. No intercostal retractions  LUNGS:Decreased breath sounds bilaterally. No ralesno stridor. No wheezes.  CARDIOVASCULAR:Regular, positive S1-S2, no  murmurs rubs or gallops appreciated. ABDOMINAL:Obese, soft, nontender, nondistended, positive bowel sounds in all 4 quadrants, no apparent ascites.  EXTREMITIES:Trace bilaterallyperipheral edema,tender to touch. HEMATOLOGIC: No significant bruising NEUROLOGIC: Oriented to person, place, and time. Nonfocal. Normal muscle tone.  PSYCHIATRIC: Normal mood and affect. Normal behavior. Cooperative  Lab Results: Hematology Recent Labs  Lab 01/22/21 0240 01/22/21 1520 01/22/21 1535 01/23/21 0921 01/24/21 0905  WBC 16.5*  --   --  13.9* 15.3*  RBC 4.79  --   --  5.06 5.26*  HGB 14.3   < > 13.9 15.2* 15.7*  HCT 42.7   < > 41.0 44.3 44.9  MCV 89.1  --   --  87.5 85.4  MCH 29.9  --   --  30.0 29.8  MCHC 33.5  --   --  34.3 35.0  RDW 13.2  --   --  13.5 13.2  PLT 174  --   --  201 221   < > = values in this interval not displayed.    Chemistry Recent Labs  Lab 01/20/21 0145 01/20/21 0220 01/22/21 0240 01/22/21 1520 01/22/21 1535 01/23/21 0824 01/24/21 0905  NA 135   < > 138   < > 139 137 137  K 3.5   < > 4.9   < > 3.8 4.4 2.7*  CL 98   < > 97*  --   --  99 94*  CO2 17*   < > 26  --   --  26 31  GLUCOSE 236*   < > 197*  --   --  160* 111*  BUN 8   < > 20  --   --  18 15  CREATININE 1.29*   < > 1.06*  --   --  0.90 1.03*  CALCIUM 8.6*   < > 9.4  --   --  8.7* 8.8*  PROT 7.3  --   --   --   --   --   --   ALBUMIN 3.2*  --   --   --   --   --   --   AST 37  --   --   --   --   --   --   ALT 18  --   --   --   --   --   --   ALKPHOS 68  --   --   --   --   --   --   BILITOT 1.0  --   --   --   --   --   --   GFRNONAA 46*   < > 58*  --   --  >60 60*  ANIONGAP 20*   < > 15  --   --  12 12   < > = values in this interval not displayed.     Cardiac Enzymes: Cardiac Panel (last 3 results) No results for input(s): CKTOTAL, CKMB, TROPONINIHS, RELINDX in the last 72 hours.  BNP (last 3 results) Recent Labs    01/20/21 0159 01/21/21 0916  BNP 206.1* 135.8*    ProBNP  (last 3 results) No results for input(s): PROBNP in the last 8760 hours.   DDimer No results for input(s): DDIMER in the last 168 hours.   Hemoglobin A1c:  Lab Results  Component Value Date   HGBA1C 6.8 (H) 12/06/2020   MPG 114 01/10/2014    TSH  Recent Labs    04/03/20 1757 09/06/20 1152 01/20/21 0918  TSH 0.732 1.160 0.294*    Lipid Panel     Component Value Date/Time   CHOL 153 12/06/2020 1638   TRIG 158 (H) 12/06/2020 1638   HDL 46 12/06/2020 1638   CHOLHDL 3.3 12/06/2020 1638   CHOLHDL 5.0 08/09/2015 1504   VLDL 44 (H) 08/09/2015 1504   LDLCALC 80 12/06/2020 1638    Imaging: CARDIAC CATHETERIZATION  Result Date: 01/22/2021 Right + left heart catheterization 01/22/2021: RA 16/12, mean 11 mmHg, RA saturation 80%. RV 47/8, EDP 15 mmHg. PA 41/26, mean 33 mmHg.  PA saturation 77%. PW 32/26, mean 29 mmHg.  Aortic saturation 98%. Cardiac output 6.58, cardiac index 3.80 by Fick.  Stroke-volume 70 mL.  SVR 16.87, PVR 1.05.  QP/QS 0.86. LV: Global hypokinesis, EF 35%.  EDP markedly elevated at 32 mmHg.  There was no pressure gradient across the aortic valve. Left main: Mild disease. LAD: Mild diffuse disease. CX: Mild diffuse disease. RI: Mild diffuse disease. RCA: Dominant, mild diffuse disease. Impression: Nonischemic cardiomyopathy with severe LV systolic dysfunction.  Mild to moderate pulmonary hypertension secondary to diastolic dysfunction with LV EDP elevation. Recommendation: Evaluation for nonischemic cardiomyopathy, aggressive diuresis.  55 mL contrast utilized.    CARDIAC DATABASE: EKG: 01/20/2021: Normal sinus rhythm, 88 bpm, left axis deviation, biatrial enlargement, left bundle branch block.  Coronary calcium scoring 05/13/2015:LAD: 8, Cx: 2. Total calcium score is 10.   Echocardiogram: 03/2015: LVEF 44%, mild LVH, mild to moderate decrease in global wall motion, grade 2 diastolic impairment, mildly dilated left atrium, mild MR and TR.  04/23/2020: Left  ventricle cavity is normal in size. Moderate concentric hypertrophy of the left ventricle. Moderate global  hypokinesis. LVEF 35-40%. Indeterminate diastolic function due to E/A fusion. Moderately dilated left atrium, mild MR.  01/20/2021:  1. Left ventricular ejection fraction, by estimation, is 35 to 40%. The left ventricle has moderately decreased function. There is mild left ventricular hypertrophy. Indeterminate diastolic filling due to E-A fusion.  2. Right ventricular systolic function is normal. The right ventricular size is normal.  3. The mitral valve is abnormal. Trivial mitral valve regurgitation.  4. The aortic valve is abnormal. Aortic valve regurgitation is not visualized. Mild to moderate aortic valve sclerosis/calcification is present, without any evidence of aortic stenosis.   Stress Testing: Lexiscan Tetrofosmin Stress Test 05/13/2020:  There is a fixed mild defect in the inferior region due to diaphragmatic attenuation. No ischemia or scar.  The LV is moderately dilated with LV end diastolic volume of 173 mL.  Overall LV systolic function is abnormal without regional wall motion abnormalities. Stress LV EF: 21%.  High risk study due to low LVEF. Findings suggest non ischemic dilated cardiomyopathy.  Heart Catheterization: Right + left heart catheterization 01/22/2021: RA 16/12, mean 11 mmHg, RA saturation 80%. RV 47/8, EDP 15 mmHg. PA 41/26, mean 33 mmHg.  PA saturation 77%. PW 32/26, mean 29 mmHg.  Aortic saturation 98%. Cardiac output 6.58, cardiac index 3.80 by Fick.  Stroke-volume 70 mL.  SVR 16.87, PVR 1.05.  QP/QS 0.86. LV: Global hypokinesis, EF 35%.  EDP markedly elevated at 32 mmHg.  There was no pressure gradient across the aortic valve. Left main: Mild disease. LAD: Mild diffuse disease. CX: Mild diffuse disease. RI: Mild diffuse disease. RCA: Dominant, mild diffuse disease.  Impression: Nonischemic cardiomyopathy with severe LV systolic  dysfunction.  Mild to moderate pulmonary hypertension secondary to diastolic dysfunction with LV EDP elevation.  Recommendation: Evaluation for nonischemic cardiomyopathy, aggressive diuresis.  55 mL contrast utilized.  Scheduled Meds: . aspirin EC  81 mg Oral Daily  . dapagliflozin propanediol  10 mg Oral Daily  . docusate sodium  100 mg Oral BID  . DULoxetine  120 mg Oral Daily  . enoxaparin (LOVENOX) injection  40 mg Subcutaneous Q24H  . ezetimibe  10 mg Oral Daily  . insulin aspart  0-15 Units Subcutaneous TID WC  . insulin aspart  0-5 Units Subcutaneous QHS  . insulin glargine  38 Units Subcutaneous Daily  . levothyroxine  75 mcg Oral Q0600  . magnesium oxide  200 mg Oral BID  . metoprolol succinate  100 mg Oral Daily  . pravastatin  80 mg Oral QPM  . sacubitril-valsartan  1 tablet Oral BID  . sodium chloride flush  3 mL Intravenous Q12H  . spironolactone  25 mg Oral Daily    Continuous Infusions: . sodium chloride    . potassium chloride 10 mEq (01/24/21 1220)    PRN Meds: sodium chloride, acetaminophen **OR** acetaminophen, albuterol, ALPRAZolam, bisacodyl, morphine injection, ondansetron **OR** ondansetron (ZOFRAN) IV, oxyCODONE, polyethylene glycol, sodium chloride flush   IMPRESSION & RECOMMENDATIONS: SAMADHI MAHURIN is a 65 y.o. female whose past medical history and cardiac risk factors include:  insulin-dependent type 2 diabetes with hyperglycemia, pure hypercholesterolemia, hypertriglyceridemia, coronary artery calcification, newly diagnosed left bundle branch block, essential hypertension, hypothyroidism, tobacco use disorder, vitamin D deficiency, generalized anxiety disorder.  Impression: Acute combined systolic and diastolic heart failure, stage C, NYHA class II: improving  Nonischemic cardiomyopathy.  Acute respiratory distress requiring BiPAP intervention: Resolved  Elevated troponins most likely secondary to supply demand ischemia in the setting of  acute respiratory distress and heart  failure exacerbation.  Calcified atherosclerotic coronary artery disease.  Left bundle branch block.  Insulin-dependent diabetes mellitus type 2  Benign essential hypertension with chronic kidney disease stage III  Hypertriglyceridemia.  Active tobacco use  Plan: Patient has been diuresed well during his hospitalization, and net negative urine output of at least 5 L. We will uptitrate Entresto to 97/103 mg p.o. twice daily Continue Toprol-XL, spironolactone, Marcelline Deist. Educated on the importance of improving her modifiable cardiovascular risk factors. Strict I's and O's and daily weights. Replace electrolytes per primary team. Patient may be discharged from a cardiovascular standpoint.  Final determination per primary team. I will see her back in the office in the next 2 weeks post discharge.  Educated on the importance of complete smoking cessation.  Given her coronary artery calcification and insulin-dependent diabetes mellitus type 2 recommend continuation of aspirin and statin therapy.  Patient has tolerated the initiation of Zetia well during his hospitalization.  This note was created using a voice recognition software as a result there may be grammatical errors inadvertently enclosed that do not reflect the nature of this encounter. Every attempt is made to correct such errors.  Total time spent: 37 minutes.  Tessa Lerner, [place], [facility]  Pager: [phone] Office: [phone]

## 2021-01-24 NOTE — Discharge Summary (Signed)
Physician Discharge Summary  Felicia Hanson ENI:[id number] DOB: [birth year] DOA: 01/20/2021  PCP: Wendie Agreste, MD  Admit date: 01/20/2021 Discharge date: 01/24/2021  Admitted From: Home  Disposition:  Home   Recommendations for Outpatient Follow-up:  1. Follow up with PCP in 1-2 weeks 2. Please obtain BMP/CBC in one week 3. Needs close follow up with Cardiologist for adjustment of HF medications.  4. Needs B-met to follow renal function and potassium level.  5.   Home Health: none  Discharge Condition: Stable.  CODE STATUS: Full code Diet recommendation: Heart Healthy   Brief/Interim Summary: 65 year old with past medical history significant for hypothyroidism, hypertension, hyperlipidemia, diabetes, CAD, depression anxiety.  Patient presented with respiratory distress and found to have associated hypoxia requiring the use of BiPAP in the setting of acute on chronic heart failure exacerbation.  Patient was treated with IV Lasix with improvement of her respiratory status.  Patient was found to have ejection fraction of 35 to 40%, cardiology is planning cardiac cath 2/23.   1-Acute Hypoxic Respiratory Failure, Acute Respiratory Distress: Secondary to acute on chronic combined systolic and diastolic heart failure exacerbation -Chest x-ray concerning for infection/inflammatory versus asymmetric anemia.  Likely asymmetric edema. -Was treated with IV Lasix.  She initially required BiPAP during hospitalization.  She is currently on room air. -IV lasix stopped. Transition to [facility] 2/25 -Negative 5 L.   Acute on chronic combined systolic and diastolic heart failure: Echo show ejection fraction 35 to 40% indeterminate diastolic filling. Cardiology consulted plan for heart cath Continue with , spironolactone, entresto Monitor weight.   Hypertension: Continue with losartan, metoprolol and spironolactone  Hyperlipidemia: Continue with pravastatin  Diabetes mellitus Type  2: Hemoglobin A1c 6.8. Continue with Lantus and sliding scale Started on Farxiga  Leukocytosis trending down. Received Steroid by EMS>. Trending down.  CKD stage 2. Monitor renal function. Correction pateint is CKD stage 2 not 3 Hypokalemia;  Replete IV and orally. Repeat Bmet this afternoon if improved, plan to discharge home today.  Plan to give one dose prior to discharge. Will provide 2 days of potassium   Discharge Diagnoses:  Principal Problem:   Acute on chronic combined systolic and diastolic CHF (congestive heart failure) (HCC) Active Problems:   Generalized anxiety disorder   Essential hypertension, benign   Pure hypercholesterolemia   Hypothyroidism   Type 2 diabetes mellitus without complication, without long-term current use of insulin (HCC)   Respiratory distress   Atypical chest pain    Discharge Instructions  Discharge Instructions    Diet - low sodium heart healthy   Complete by: As directed    Increase activity slowly   Complete by: As directed      Allergies as of 01/24/2021      Reactions   Metformin And Related Anaphylaxis   Could not swallow   Nicotine Rash   Only allergic to nicotine patch   Ace Inhibitors Cough   Cough with lisinopril.       Medication List    STOP taking these medications   amphetamine-dextroamphetamine 20 MG tablet Commonly known as: ADDERALL   losartan 50 MG tablet Commonly known as: COZAAR   metoprolol tartrate 50 MG tablet Commonly known as: LOPRESSOR   Trulicity 3 IR/4.4RX Sopn Generic drug: Dulaglutide     TAKE these medications   Accu-Chek Aviva Plus test strip Generic drug: glucose blood daily. for testing   ALPRAZolam 1 MG tablet Commonly known as: XANAX Take 0.5 mg by mouth 3 (three) times daily  as needed for anxiety or sleep.   aspirin 81 MG tablet Take 81 mg by mouth daily.   betamethasone dipropionate 0.05 % lotion Apply 1 application topically as needed (rash).   cetirizine 10 MG  tablet Commonly known as: ZYRTEC Take 1 tablet (10 mg total) by mouth daily. What changed:   when to take this  reasons to take this   dapagliflozin propanediol 10 MG Tabs tablet Commonly known as: FARXIGA Take 1 tablet (10 mg total) by mouth daily. Start taking on: January 25, 2021   Dexcom G6 Receiver Devi 1 Device by Does not apply route every morning. Device and supplies   DULoxetine 60 MG capsule Commonly known as: CYMBALTA Take 2 capsules (120 mg total) by mouth daily.   ezetimibe 10 MG tablet Commonly known as: ZETIA Take 1 tablet (10 mg total) by mouth daily. Start taking on: January 25, 2021   Insulin Pen Needle 31G X 5 MM Misc Inject 1 each into the skin daily.   Lantus SoloStar 100 UNIT/ML Solostar Pen Generic drug: insulin glargine Inject 38 Units into the skin daily.   levothyroxine 88 MCG tablet Commonly known as: SYNTHROID TAKE 1 TABLET (88 MCG TOTAL) BY MOUTH DAILY BEFORE BREAKFAST.   magnesium oxide 400 (241.3 Mg) MG tablet Commonly known as: MAG-OX Take 0.5 tablets (200 mg total) by mouth 2 (two) times daily.   metoprolol succinate 100 MG 24 hr tablet Commonly known as: TOPROL-XL Take 1 tablet (100 mg total) by mouth daily. Take with or immediately following a meal. Start taking on: January 25, 2021   nitroGLYCERIN 0.4 MG SL tablet Commonly known as: NITROSTAT PLACE 1 TABLET UNDER THE TONGUE EVERY 5 MINUTES AS NEEDED FOR CHEST PAIN. IF YOU REQUIRE MORE THAN TWO TABLETS FIVE MINUTES APART GO TO THE NEAREST ER VIA EMS. What changed: See the new instructions.   ondansetron 4 MG disintegrating tablet Commonly known as: Zofran ODT Take 1 tablet (4 mg total) by mouth every 8 (eight) hours as needed for nausea or vomiting.   potassium chloride SA 20 MEQ tablet Commonly known as: KLOR-CON Take 1 tablet (20 mEq total) by mouth daily.   pravastatin 80 MG tablet Commonly known as: PRAVACHOL Take 1 tablet (80 mg total) by mouth every evening.    sacubitril-valsartan 97-103 MG Commonly known as: ENTRESTO Take 1 tablet by mouth 2 (two) times daily.   spironolactone 25 MG tablet Commonly known as: ALDACTONE Take 1 tablet (25 mg total) by mouth daily. Start taking on: January 25, 2021   triamcinolone cream 0.5 % Commonly known as: KENALOG Apply 1 application topically 3 (three) times daily. Apply to back and forearms, avoid the face   Vitamin D (Ergocalciferol) 1.25 MG (50000 UNIT) Caps capsule Commonly known as: DRISDOL TAKE 1 CAPSULE (50,000 UNITS TOTAL) BY MOUTH EVERY 7 (SEVEN) DAYS.       Follow-up Information    Wendie Agreste, MD Follow up in 1 week(s).   Specialties: Family Medicine, Sports Medicine Contact information: [place] [address] [phone]        Rex Kras, DO Follow up on 02/06/2021.   Specialties: Cardiology, Radiology, Vascular Surgery Why: Appointment 02/06/21 at 10:45am. Please bring all medications.  Contact information: [place] [address] [facility]. Go on 01/28/2021.   Specialty: Cardiology Why: at 3pm. Bring all medications with you to your appt. FREE  valet parking at [place] off [employer] information: [address] [id number] [place] [phone]             Allergies  Allergen Reactions  . Metformin And Related Anaphylaxis    Could not swallow  . Nicotine Rash    Only allergic to nicotine patch  . Ace Inhibitors Cough    Cough with lisinopril.     Consultations: Cardiology   Procedures/Studies: CARDIAC CATHETERIZATION  Result Date: 01/22/2021 Right + left heart catheterization 01/22/2021: RA 16/12, mean 11 mmHg, RA saturation 80%. RV 47/8, EDP 15 mmHg. PA 41/26, mean 33 mmHg.  PA saturation 77%. PW 32/26, mean 29 mmHg.  Aortic saturation 98%. Cardiac output 6.58, cardiac index 3.80 by Fick.   Stroke-volume 70 mL.  SVR 16.87, PVR 1.05.  QP/QS 0.86. LV: Global hypokinesis, EF 35%.  EDP markedly elevated at 32 mmHg.  There was no pressure gradient across the aortic valve. Left main: Mild disease. LAD: Mild diffuse disease. CX: Mild diffuse disease. RI: Mild diffuse disease. RCA: Dominant, mild diffuse disease. Impression: Nonischemic cardiomyopathy with severe LV systolic dysfunction.  Mild to moderate pulmonary hypertension secondary to diastolic dysfunction with LV EDP elevation. Recommendation: Evaluation for nonischemic cardiomyopathy, aggressive diuresis.  55 mL contrast utilized.   DG Chest Portable 1 View  Result Date: 01/20/2021 CLINICAL DATA:  Shortness of breath EXAM: PORTABLE CHEST 1 VIEW COMPARISON:  04/18/2020 FINDINGS: Mixed hazy and patchy opacities present in the mid to lower lungs, more focally coalescent in the right lung base. No pneumothorax or visible effusion the portions of the costophrenic sulci are collimated from view. Prominent cardiomediastinal contours though possibly accentuated by portable technique. Remaining cardiomediastinal contours are unremarkable. No acute osseous or soft tissue abnormality. Telemetry leads overlie the chest. IMPRESSION: Mixed hazy and patchy opacities in the mid to lower lungs, more focally coalescent in the right lung base, could acute infection/inflammation or asymmetric edema. Electronically Signed   By: Lovena Le M.D.   On: 01/20/2021 01:54   ECHOCARDIOGRAM COMPLETE  Result Date: 01/20/2021    ECHOCARDIOGRAM REPORT   Patient Name:   LARK LANGENFELD Date of Exam: 01/20/2021 Medical Rec #:  [id number]      Height:       60.0 in Accession #:    [id number]     Weight:       165.0 lb Date of Birth:  08/26/1956      BSA:          1.720 m Patient Age:    28 years       BP:           141/69 mmHg Patient Gender: F              HR:           81 bpm. Exam Location:  Inpatient Procedure: 2D Echo Indications:    acute diastolic chf 003.70  History:         Patient has prior history of Echocardiogram examinations, most                 recent 04/18/2015. CHF, CAD, Signs/Symptoms:Shortness of Breath                 and lower extremity edema; Risk Factors:Hypertension,                 Dyslipidemia and Diabetes.  Sonographer:    Johny Chess Referring Phys: [place]  1.  Left ventricular ejection fraction, by estimation, is 35 to 40%. The left ventricle has moderately decreased function. There is mild left ventricular hypertrophy. Indeterminate diastolic filling due to E-A fusion.  2. Right ventricular systolic function is normal. The right ventricular size is normal.  3. The mitral valve is abnormal. Trivial mitral valve regurgitation.  4. The aortic valve is abnormal. Aortic valve regurgitation is not visualized. Mild to moderate aortic valve sclerosis/calcification is present, without any evidence of aortic stenosis. FINDINGS  Left Ventricle: LVEF is depressed with severe hypokinesis/akinesis of the inferior/inferoseptal walls. Left ventricular ejection fraction, by estimation, is 35 to 40%. The left ventricle has moderately decreased function. The left ventricular internal cavity size was normal in size. There is mild left ventricular hypertrophy. Indeterminate diastolic filling due to E-A fusion. Right Ventricle: The right ventricular size is normal. Right vetricular wall thickness was not assessed. Right ventricular systolic function is normal. Left Atrium: Left atrial size was normal in size. Right Atrium: Right atrial size was normal in size. Pericardium: Trivial pericardial effusion is present. Mitral Valve: The mitral valve is abnormal. There is mild thickening of the mitral valve leaflet(s). Mild mitral annular calcification. Trivial mitral valve regurgitation. Tricuspid Valve: The tricuspid valve is normal in structure. Tricuspid valve regurgitation is trivial. Aortic Valve: The aortic valve is abnormal. Aortic valve regurgitation is  not visualized. Mild to moderate aortic valve sclerosis/calcification is present, without any evidence of aortic stenosis. Pulmonic Valve: The pulmonic valve was normal in structure. Pulmonic valve regurgitation is not visualized. IAS/Shunts: The interatrial septum was not assessed.  LEFT VENTRICLE PLAX 2D LVIDd:         5.10 cm LVIDs:         4.00 cm LV PW:         1.20 cm LV IVS:        1.20 cm LVOT diam:     2.10 cm LV SV:         61 LV SV Index:   36 LVOT Area:     3.46 cm  LV Volumes (MOD) LV vol d, MOD A4C: 94.3 ml LV vol s, MOD A4C: 65.7 ml LV SV MOD A4C:     94.3 ml RIGHT VENTRICLE             IVC RV S prime:     12.70 cm/s  IVC diam: 1.60 cm TAPSE (M-mode): 1.8 cm LEFT ATRIUM             Index       RIGHT ATRIUM           Index LA diam:        4.10 cm 2.38 cm/m  RA Area:     12.80 cm LA Vol (A2C):   55.7 ml 32.38 ml/m RA Volume:   27.90 ml  16.22 ml/m LA Vol (A4C):   43.5 ml 25.29 ml/m LA Biplane Vol: 54.1 ml 31.45 ml/m  AORTIC VALVE LVOT Vmax:   85.80 cm/s LVOT Vmean:  62.800 cm/s LVOT VTI:    0.177 m  AORTA Ao Root diam: 2.80 cm Ao Asc diam:  3.00 cm  SHUNTS Systemic VTI:  0.18 m Systemic Diam: 2.10 cm Dorris Carnes MD Electronically signed by Dorris Carnes MD Signature Date/Time: 01/20/2021/4:53:48 PM    Final     Subjective: She is feeling better. Ready to go home denies dyspnea  Discharge Exam: Vitals:   01/24/21 0657 01/24/21 0802  BP: 140/68 (!) 173/74  Pulse: 72 75  Resp: 18 18  Temp: 97.7 F (36.5 C) 98.1 F (36.7 C)  SpO2: 99% 99%     General: Pt is alert, awake, not in acute distress Cardiovascular: RRR, S1/S2 +, no rubs, no gallops Respiratory: CTA bilaterally, no wheezing, no rhonchi Abdominal: Soft, NT, ND, bowel sounds + Extremities: no edema, no cyanosis    The results of significant diagnostics from this hospitalization (including imaging, microbiology, ancillary and laboratory) are listed below for reference.     Microbiology: Recent Results (from the past  240 hour(s))  Resp Panel by RT-PCR (Flu A&B, Covid) Nasopharyngeal Swab     Status: None   Collection Time: 01/20/21  2:05 AM   Specimen: Nasopharyngeal Swab; Nasopharyngeal(NP) swabs in vial transport medium  Result Value Ref Range Status   SARS Coronavirus 2 by RT PCR NEGATIVE NEGATIVE Final    Comment: (NOTE) SARS-CoV-2 target nucleic acids are NOT DETECTED.  The SARS-CoV-2 RNA is generally detectable in upper respiratory specimens during the acute phase of infection. The lowest concentration of SARS-CoV-2 viral copies this assay can detect is 138 copies/mL. A negative result does not preclude SARS-Cov-2 infection and should not be used as the sole basis for treatment or other patient management decisions. A negative result may occur with  improper specimen collection/handling, submission of specimen other than nasopharyngeal swab, presence of viral mutation(s) within the areas targeted by this assay, and inadequate number of viral copies(<138 copies/mL). A negative result must be combined with clinical observations, patient history, and epidemiological information. The expected result is Negative.  Fact Sheet for Patients:  EntrepreneurPulse.com.au  Fact Sheet for Healthcare Providers:  IncredibleEmployment.be  This test is no t yet approved or cleared by the [place] FDA and  has been authorized for detection and/or diagnosis of SARS-CoV-2 by FDA under an Emergency Use Authorization (EUA). This EUA will remain  in effect (meaning this test can be used) for the duration of the COVID-19 declaration under Section 564(b)(1) of the Act, 21 U.S.C.section 360bbb-3(b)(1), unless the authorization is terminated  or revoked sooner.       Influenza A by PCR NEGATIVE NEGATIVE Final   Influenza B by PCR NEGATIVE NEGATIVE Final    Comment: (NOTE) The Xpert Xpress SARS-CoV-2/FLU/RSV plus assay is intended as an aid in the diagnosis of influenza  from Nasopharyngeal swab specimens and should not be used as a sole basis for treatment. Nasal washings and aspirates are unacceptable for Xpert Xpress SARS-CoV-2/FLU/RSV testing.  Fact Sheet for Patients: EntrepreneurPulse.com.au  Fact Sheet for Healthcare Providers: IncredibleEmployment.be  This test is not yet approved or cleared by the [place] FDA and has been authorized for detection and/or diagnosis of SARS-CoV-2 by FDA under an Emergency Use Authorization (EUA). This EUA will remain in effect (meaning this test can be used) for the duration of the COVID-19 declaration under Section 564(b)(1) of the Act, 21 U.S.C. section 360bbb-3(b)(1), unless the authorization is terminated or revoked.  Performed at [facility], [place] [address] [place], [place] [address]      Labs: BNP (last 3 results) Recent Labs    01/20/21 0159 01/21/21 0916  BNP 206.1* 614.4*   Basic Metabolic Panel: Recent Labs  Lab 01/20/21 0145 01/20/21 0220 01/20/21 0356 01/21/21 0500 01/21/21 1746 01/22/21 0240 01/22/21 1520 01/22/21 1523 01/22/21 1535 01/23/21 0824 01/23/21 1932 01/24/21 0905 01/24/21 1511  NA 135   < >  --  136 140 138   < > 139 139 137  --  137 137  K 3.5   < >  --  4.2 5.3* 4.9   < > 3.8 3.8 4.4  --  2.7* 3.3*  CL 98  --   --  98 100 97*  --   --   --  99  --  94* 94*  CO2 17*  --   --  $R'25 24 26  'Ju$ --   --   --  26  --  31 29  GLUCOSE 236*  --   --  158* 271* 197*  --   --   --  160*  --  111* 142*  BUN 8  --   --  $R'19 21 20  'tF$ --   --   --  18  --  15 16  CREATININE 1.29*  --   --  1.12* 1.29* 1.06*  --   --   --  0.90  --  1.03* 0.99  CALCIUM 8.6*  --   --  9.0 9.7 9.4  --   --   --  8.7*  --  8.8* 8.8*  MG 5.6*  --  2.5* 2.0  --   --   --   --   --   --  1.7  --   --    < > = values in this interval not displayed.   Liver Function Tests: Recent Labs  Lab 01/20/21 0145  AST 37  ALT 18  ALKPHOS 68  BILITOT 1.0  PROT 7.3   ALBUMIN 3.2*   No results for input(s): LIPASE, AMYLASE in the last 168 hours. No results for input(s): AMMONIA in the last 168 hours. CBC: Recent Labs  Lab 01/20/21 0145 01/20/21 0220 01/21/21 0916 01/22/21 0240 01/22/21 1520 01/22/21 1523 01/22/21 1535 01/23/21 0921 01/24/21 0905  WBC 17.8*  --  20.0* 16.5*  --   --   --  13.9* 15.3*  NEUTROABS 9.3*  --   --   --   --   --   --   --   --   HGB 15.7*   < > 14.4 14.3 13.9 13.9 13.9 15.2* 15.7*  HCT 49.4*   < > 43.9 42.7 41.0 41.0 41.0 44.3 44.9  MCV 92.5  --  88.5 89.1  --   --   --  87.5 85.4  PLT 234  --  228 174  --   --   --  201 221   < > = values in this interval not displayed.   Cardiac Enzymes: No results for input(s): CKTOTAL, CKMB, CKMBINDEX, TROPONINI in the last 168 hours. BNP: Invalid input(s): POCBNP CBG: Recent Labs  Lab 01/23/21 1145 01/23/21 1620 01/23/21 2101 01/24/21 0801 01/24/21 1132  GLUCAP 260* 180* 145* 112* 298*   D-Dimer No results for input(s): DDIMER in the last 72 hours. Hgb A1c No results for input(s): HGBA1C in the last 72 hours. Lipid Profile No results for input(s): CHOL, HDL, LDLCALC, TRIG, CHOLHDL, LDLDIRECT in the last 72 hours. Thyroid function studies No results for input(s): TSH, T4TOTAL, T3FREE, THYROIDAB in the last 72 hours.  Invalid input(s): FREET3 Anemia work up No results for input(s): VITAMINB12, FOLATE, FERRITIN, TIBC, IRON, RETICCTPCT in the last 72 hours. Urinalysis    Component Value Date/Time   BILIRUBINUR small (A) 12/06/2020 1639   BILIRUBINUR neg 02/06/2015 1422   KETONESUR trace (5) (A) 12/06/2020 1639   PROTEINUR =100 (A) 12/06/2020 1639   PROTEINUR neg 02/06/2015 1422   UROBILINOGEN 2.0 (A) 12/06/2020 1639  NITRITE Negative 12/06/2020 1639   NITRITE neg 02/06/2015 1422   LEUKOCYTESUR Small (1+) (A) 12/06/2020 1639   Sepsis Labs Invalid input(s): PROCALCITONIN,  WBC,  LACTICIDVEN Microbiology Recent Results (from the past 240 hour(s))  Resp  Panel by RT-PCR (Flu A&B, Covid) Nasopharyngeal Swab     Status: None   Collection Time: 01/20/21  2:05 AM   Specimen: Nasopharyngeal Swab; Nasopharyngeal(NP) swabs in vial transport medium  Result Value Ref Range Status   SARS Coronavirus 2 by RT PCR NEGATIVE NEGATIVE Final    Comment: (NOTE) SARS-CoV-2 target nucleic acids are NOT DETECTED.  The SARS-CoV-2 RNA is generally detectable in upper respiratory specimens during the acute phase of infection. The lowest concentration of SARS-CoV-2 viral copies this assay can detect is 138 copies/mL. A negative result does not preclude SARS-Cov-2 infection and should not be used as the sole basis for treatment or other patient management decisions. A negative result may occur with  improper specimen collection/handling, submission of specimen other than nasopharyngeal swab, presence of viral mutation(s) within the areas targeted by this assay, and inadequate number of viral copies(<138 copies/mL). A negative result must be combined with clinical observations, patient history, and epidemiological information. The expected result is Negative.  Fact Sheet for Patients:  EntrepreneurPulse.com.au  Fact Sheet for Healthcare Providers:  IncredibleEmployment.be  This test is no t yet approved or cleared by the [place] FDA and  has been authorized for detection and/or diagnosis of SARS-CoV-2 by FDA under an Emergency Use Authorization (EUA). This EUA will remain  in effect (meaning this test can be used) for the duration of the COVID-19 declaration under Section 564(b)(1) of the Act, 21 U.S.C.section 360bbb-3(b)(1), unless the authorization is terminated  or revoked sooner.       Influenza A by PCR NEGATIVE NEGATIVE Final   Influenza B by PCR NEGATIVE NEGATIVE Final    Comment: (NOTE) The Xpert Xpress SARS-CoV-2/FLU/RSV plus assay is intended as an aid in the diagnosis of influenza from Nasopharyngeal  swab specimens and should not be used as a sole basis for treatment. Nasal washings and aspirates are unacceptable for Xpert Xpress SARS-CoV-2/FLU/RSV testing.  Fact Sheet for Patients: EntrepreneurPulse.com.au  Fact Sheet for Healthcare Providers: IncredibleEmployment.be  This test is not yet approved or cleared by the [place] FDA and has been authorized for detection and/or diagnosis of SARS-CoV-2 by FDA under an Emergency Use Authorization (EUA). This EUA will remain in effect (meaning this test can be used) for the duration of the COVID-19 declaration under Section 564(b)(1) of the Act, 21 U.S.C. section 360bbb-3(b)(1), unless the authorization is terminated or revoked.  Performed at [facility], [place] [address], [place], [place] [address]      Time coordinating discharge: 40 minutes  SIGNED:   Elmarie Shiley, MD  [facility]

## 2021-01-24 NOTE — Progress Notes (Signed)
Heart Failure Navigator Progress Note  Education Assessment and Provision:  Detailed education and instructions provided on heart failure disease management including the following:  Signs and symptoms of Heart Failure When to call the physician Importance of daily weights Low sodium diet Fluid restriction Medication management Anticipated future follow-up appointments  Patient education given on each of the above topics.  Patient acknowledges understanding via teach back method and acceptance of all instructions.  Education Materials:  "Living Better With Heart Failure" Booklet, HF zone tool, & Daily Weight Tracker Tool.  Patient has scale at home: yes Patient has pill box at home: yes   HVC TOC clinic appt scheduled for 3/1 @ 3pm  Ozella Rocks, RN, BSN Heart Failure Nurse Navigator [phone]

## 2021-01-24 NOTE — Progress Notes (Signed)
CRITICAL VALUE ALERT  Critical value received:  Potassium level 2.7  Date of notification:  01/24/2021  Time of notification:  1010  Critical value read back:Yes.    Nurse who received alert:  Victorino December RN  MD notified (1st page): Hartley Barefoot   Time of first page:  1010  Responding MD:  Hartley Barefoot  Time MD responded:  1015  Notified Lindi Adie RN

## 2021-01-24 NOTE — Care Management Important Message (Signed)
Important Message  Patient Details  Name: Felicia Hanson MRN: [id number] Date of Birth: [birth year]   Medicare Important Message Given:  Yes     Renie Ora 01/24/2021, 12:54 PM

## 2021-01-27 ENCOUNTER — Telehealth: Payer: Self-pay

## 2021-01-27 NOTE — Telephone Encounter (Signed)
Patient called to make you aware after she came home from the hospital on Friday her ears have been stopped up she doesn't feel any drainage. Patient just wanted to make you aware

## 2021-01-28 ENCOUNTER — Telehealth (HOSPITAL_COMMUNITY): Payer: Self-pay

## 2021-01-28 ENCOUNTER — Other Ambulatory Visit: Payer: Self-pay

## 2021-01-28 ENCOUNTER — Ambulatory Visit (HOSPITAL_COMMUNITY)
Admit: 2021-01-28 | Discharge: 2021-01-28 | Disposition: A | Discharge status: Home / Self Care | Payer: Medicare Other | Attending: Internal Medicine | Admitting: Internal Medicine

## 2021-01-28 VITALS — BP 152/76 | HR 70 | Wt 163.0 lb

## 2021-01-28 DIAGNOSIS — I447 Left bundle-branch block, unspecified: Secondary | ICD-10-CM | POA: Insufficient documentation

## 2021-01-28 DIAGNOSIS — Z9114 Patient's other noncompliance with medication regimen: Secondary | ICD-10-CM | POA: Diagnosis not present

## 2021-01-28 DIAGNOSIS — R079 Chest pain, unspecified: Secondary | ICD-10-CM | POA: Insufficient documentation

## 2021-01-28 DIAGNOSIS — Z7982 Long term (current) use of aspirin: Secondary | ICD-10-CM | POA: Diagnosis not present

## 2021-01-28 DIAGNOSIS — Z79899 Other long term (current) drug therapy: Secondary | ICD-10-CM | POA: Diagnosis not present

## 2021-01-28 DIAGNOSIS — E7800 Pure hypercholesterolemia, unspecified: Secondary | ICD-10-CM | POA: Diagnosis not present

## 2021-01-28 DIAGNOSIS — I5042 Chronic combined systolic (congestive) and diastolic (congestive) heart failure: Secondary | ICD-10-CM

## 2021-01-28 DIAGNOSIS — Z79890 Hormone replacement therapy (postmenopausal): Secondary | ICD-10-CM | POA: Diagnosis not present

## 2021-01-28 DIAGNOSIS — E785 Hyperlipidemia, unspecified: Secondary | ICD-10-CM | POA: Insufficient documentation

## 2021-01-28 DIAGNOSIS — F17210 Nicotine dependence, cigarettes, uncomplicated: Secondary | ICD-10-CM | POA: Insufficient documentation

## 2021-01-28 DIAGNOSIS — Z5181 Encounter for therapeutic drug level monitoring: Secondary | ICD-10-CM | POA: Diagnosis not present

## 2021-01-28 DIAGNOSIS — Z7984 Long term (current) use of oral hypoglycemic drugs: Secondary | ICD-10-CM | POA: Insufficient documentation

## 2021-01-28 DIAGNOSIS — E039 Hypothyroidism, unspecified: Secondary | ICD-10-CM | POA: Insufficient documentation

## 2021-01-28 DIAGNOSIS — I2510 Atherosclerotic heart disease of native coronary artery without angina pectoris: Secondary | ICD-10-CM | POA: Diagnosis not present

## 2021-01-28 DIAGNOSIS — I272 Pulmonary hypertension, unspecified: Secondary | ICD-10-CM | POA: Insufficient documentation

## 2021-01-28 DIAGNOSIS — I110 Hypertensive heart disease with heart failure: Secondary | ICD-10-CM | POA: Insufficient documentation

## 2021-01-28 DIAGNOSIS — E119 Type 2 diabetes mellitus without complications: Secondary | ICD-10-CM

## 2021-01-28 DIAGNOSIS — Z794 Long term (current) use of insulin: Secondary | ICD-10-CM | POA: Insufficient documentation

## 2021-01-28 LAB — BASIC METABOLIC PANEL
Anion gap: 11 (ref 5–15)
BUN: 12 mg/dL (ref 8–23)
CO2: 31 mmol/L (ref 22–32)
Calcium: 9.7 mg/dL (ref 8.9–10.3)
Chloride: 97 mmol/L — ABNORMAL LOW (ref 98–111)
Creatinine, Ser: 1.4 mg/dL — ABNORMAL HIGH (ref 0.44–1.00)
GFR, Estimated: 41 mL/min — ABNORMAL LOW (ref >60)
Glucose, Bld: 105 mg/dL — ABNORMAL HIGH (ref 70–99)
Potassium: 4.9 mmol/L (ref 3.5–5.1)
Sodium: 139 mmol/L (ref 135–145)

## 2021-01-28 NOTE — Telephone Encounter (Signed)
Confirmed HV TOC 3/1 @ 3pm. Pt reminded to bring medications to appt. Directions given.   Ozella Rocks, RN, BSN Heart Failure Nurse Navigator [phone]

## 2021-01-28 NOTE — Progress Notes (Addendum)
[facility]  Heart Failure Pharmacist Encounter   Application for [employer] Patient Assistance Program started in an effort to reduce the patient's out of pocket expense for Entresto to $0. Patient portion has been completed and signed today in clinic.    Completed application to be faxed to [phone].   [employer] patient assistance phone number for follow up is [phone].    Application for [employer] Patient Assistance Program started in an effort to reduce the patient's out of pocket expense for Farxiga to $0. Patient portion has been completed and signed today in clinic.     Completed application to be faxed to [phone]   [facility] patient assistance phone number for follow up is [phone]   Patient was given the patient-completed forms to bring to cardiology office for Dr. Jacinto Halim or Dr. Odis Hollingshead to sign and complete. Callback number left on the forms for their office to call me if there are any questions or issues.  Also created a pill calendar for patient and her grandaughter to use to help arrange medications in the pill box. Prior to visit today, she had been taking each medication at different times of the day because she wasn't sure what could be taken together. Pill box also provided to patient.    Sharen Hones, PharmD, BCPS Heart Failure [facility] Pharmacist [phone]

## 2021-01-28 NOTE — Progress Notes (Addendum)
[facility]  PCP: Meredith Staggers Primary Cardiologist: Dr. Odis Hollingshead, [place]  HPI:  Felicia Hanson is a 65 y.o.  female  with a PMH significant for Combined Systolic and Diastolic CHF, CAD, HLD, HTN, hypothyroidism, T2DM  She was referred to [facility] for recurrence of chest pain by PCP in 2021. Apparently had similar symptoms in 2016 but workup largely unremarkable. She had continued symptoms and received workup below which is compared to her 2016 workup.    Coronary calcium scoring 05/13/2015: LAD: 8, Cx: 2. Total calcium score is 10.  Lexiscan sestamibi stress test: 04/08/2015: Myocardial perfusion imaging is normal. Overall left ventricular systolic function was normal without regional wall motion abnormalities. The left ventricular ejection fraction was 61%.  Lexiscan Tetrofosmin Stress Test 05/13/2020:  There is a fixed mild defect in the inferior region due to diaphragmatic attenuation. No ischemia or scar.  The LV is moderately dilated with LV end diastolic volume of 173 mL.  Overall LV systolic function is abnormal without regional wall motion abnormalities. Stress LV EF: 21%.  High risk study due to low LVEF. Findings suggest non ischemic dilated cardiomyopathy.  03/2015: LVEF 44%, mild LVH, mild to moderate decrease in global wall motion, grade 2 diastolic impairment, mildly dilated left atrium, mild MR and TR.  04/23/2020: Left ventricle cavity is normal in size. Moderate concentric hypertrophy of the left ventricle. Moderate global hypokinesis. LVEF 35-40%. Indeterminate diastolic function due to E/A fusion.  Moderately dilated left atrium, mild MR.  Given these findings and the patient's symptoms they recommended a LHC however the patient and husband deferred the LHC and asked for time to think about the procedure.  Seen several times in 2021 and LHC was deferred by patient. Continued to have intermittent symptoms.     Ultimately in 12/2020 she was admitted for acute decompensated HFrEF.  She was started on IV diuresis about 5L net negative before discharge.  Right + left heart catheterization 01/22/2021: RA 16/12, mean 11 mmHg, RA saturation 80%. RV 47/8, EDP 15 mmHg. PA 41/26, mean 33 mmHg.  PA saturation 77%. PW 32/26, mean 29 mmHg.  Aortic saturation 98%. Cardiac output 6.58, cardiac index 3.80 by Fick.  Stroke-volume 70 mL.  SVR 16.87, PVR 1.05.  QP/QS 0.86. LV: Global hypokinesis, EF 35%.  EDP markedly elevated at 32 mmHg.  There was no pressure gradient across the aortic valve. Left main: Mild disease. LAD: Mild diffuse disease. CX: Mild diffuse disease. RI: Mild diffuse disease. RCA: Dominant, mild diffuse disease.  Impression: Nonischemic cardiomyopathy with severe LV systolic dysfunction.  Mild to moderate pulmonary hypertension secondary to diastolic dysfunction with LV EDP elevation.  ECHO with EF 35-40%, normal RV function/size, no sig valve disease  Breathing well since discharge. She walked from the parking lot to here and did not have to stop and did not feel short of breath but she goes slow.  Doing housework and ADL's without issue. Weighing herself since discharge, has been consistently 163lbs, she is 163 here today.  She brought her medications today she is taking them throughout the day instead of as directed trying to space them out.  She was confused about how to take them, has only taken farxiga, miralax and her probiotic this morning.  Wasn't sure how much water to drink so she has just been taking sips daily drinking about one bottle of water per day.    ROS: All systems negative except as listed in HPI, PMH and  Problem List.  SH:  Social History   Socioeconomic History  . Marital status: Married    Spouse name: Not on file  . Number of children: 2  . Years of education: Not on file  . Highest education level: Not on file  Occupational History  . Not on file  Tobacco  Use  . Smoking status: Current Every Day Smoker    Packs/day: 0.75    Years: 38.00    Pack years: 28.50    Types: Cigarettes  . Smokeless tobacco: Never Used  . Tobacco comment: not ready to quit but has decreased from 2ppd to 0.75ppd   Vaping Use  . Vaping Use: Former  Substance and Sexual Activity  . Alcohol use: No    Alcohol/week: 0.0 standard drinks  . Drug use: No  . Sexual activity: Not Currently  Other Topics Concern  . 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 Difficulty of Paying Living Expenses: Not very hard  Food Insecurity: No Food Insecurity  . Worried About Programme researcher, broadcasting/film/video in the Last Year: Never true  . Ran Out of Food in the Last Year: Never true  Transportation Needs: No Transportation Needs  . Lack of Transportation (Medical): No  . Lack of Transportation (Non-Medical): No  Physical Activity: Not on file  Stress: Not on file  Social Connections: Not on file  Intimate Partner Violence: Not on file    FH:  Family History  Problem Relation Age of Onset  . Cancer Mother        unknown primary  . Hyperlipidemia Mother   . Breast cancer Paternal Aunt     Past Medical History:  Diagnosis Date  . Anxiety   . Coronary artery calcification   . Depression   . Diabetes mellitus without complication (HCC)   . Hyperlipidemia   . Hypertension   . Thyroid disease     Current Outpatient Medications  Medication Sig Dispense Refill  . ACCU-CHEK AVIVA PLUS test strip daily. for testing  2  . ALPRAZolam (XANAX) 1 MG tablet Take 0.5 mg by mouth 3 (three) times daily as needed for anxiety or sleep.     Marland Kitchen aspirin 81 MG tablet Take 81 mg by mouth daily.    . betamethasone dipropionate 0.05 %  lotion Apply 1 application topically as needed (rash).    . cetirizine (ZYRTEC) 10 MG tablet Take 1 tablet (10 mg total) by mouth daily. (Patient taking differently: Take 10 mg by mouth daily as needed for allergies.) 30 tablet 11  . Continuous Blood Gluc Receiver (DEXCOM G6 RECEIVER) DEVI 1 Device by Does not apply route every morning. Device and supplies 1 each 0  . dapagliflozin propanediol (FARXIGA) 10 MG TABS tablet Take 1 tablet (10 mg total) by mouth daily. 30 tablet 3  . DULoxetine (CYMBALTA) 60 MG capsule Take 2 capsules (120 mg total) by mouth daily. 180 capsule 1  . ezetimibe (ZETIA) 10 MG tablet Take 1 tablet (10 mg total) by mouth daily. 30 tablet 2  . famotidine (PEPCID) 20 MG tablet Take 20 mg by mouth  daily as needed for heartburn or indigestion.    . insulin glargine (LANTUS SOLOSTAR) 100 UNIT/ML Solostar Pen Inject 38 Units into the skin daily. 45 mL 1  . Insulin Pen Needle 31G X 5 MM MISC Inject 1 each into the skin daily. 100 each 2  . levothyroxine (SYNTHROID) 88 MCG tablet TAKE 1 TABLET (88 MCG TOTAL) BY MOUTH DAILY BEFORE BREAKFAST. 90 tablet 1  . magnesium oxide (MAG-OX) 400 (241.3 Mg) MG tablet Take 0.5 tablets (200 mg total) by mouth 2 (two) times daily. 10 tablet 0  . metoprolol succinate (TOPROL-XL) 100 MG 24 hr tablet Take 1 tablet (100 mg total) by mouth daily. Take with or immediately following a meal. 30 tablet 3  . nitroGLYCERIN (NITROSTAT) 0.4 MG SL tablet PLACE 1 TABLET UNDER THE TONGUE EVERY 5 MINUTES AS NEEDED FOR CHEST PAIN. IF YOU REQUIRE MORE THAN TWO TABLETS FIVE MINUTES APART GO TO THE NEAREST ER VIA EMS. (Patient taking differently: Place 0.4 mg under the tongue every 5 (five) minutes as needed for chest pain.) 25 tablet 1  . ondansetron (ZOFRAN ODT) 4 MG disintegrating tablet Take 1 tablet (4 mg total) by mouth every 8 (eight) hours as needed for nausea or vomiting. 20 tablet 1  . potassium chloride SA (KLOR-CON) 20 MEQ tablet Take 1 tablet (20 mEq total) by  mouth daily. 2 tablet 0  . pravastatin (PRAVACHOL) 80 MG tablet Take 1 tablet (80 mg total) by mouth every evening. 90 tablet 1  . sacubitril-valsartan (ENTRESTO) 97-103 MG Take 1 tablet by mouth 2 (two) times daily. 60 tablet 3  . spironolactone (ALDACTONE) 25 MG tablet Take 1 tablet (25 mg total) by mouth daily. 30 tablet 3  . triamcinolone cream (KENALOG) 0.5 % Apply 1 application topically 3 (three) times daily. Apply to back and forearms, avoid the face 454 g 1  . Vitamin D, Ergocalciferol, (DRISDOL) 1.25 MG (50000 UNIT) CAPS capsule TAKE 1 CAPSULE (50,000 UNITS TOTAL) BY MOUTH EVERY 7 (SEVEN) DAYS. 15 capsule 0   No current facility-administered medications for this encounter.    Vitals:   01/28/21 1510  BP: (!) 152/76  Pulse: 70  SpO2: 98%  Weight: 73.9 kg (163 lb)    PHYSICAL EXAM: Cardiac: JVD flat, normal rate and rhythm, clear s1 and s2, no murmurs, rubs or gallops, Trace LE edema bilaterally Pulmonary: CTAB, not in distress Abdominal: non distended abdomen, soft and nontender Psych: Anxious, Alert, conversant, in good spirits   ASSESSMENT & PLAN: Chronic Systolic and Diastolic CHF:  -12/9516: LVEF 44%, mild LVH, mild to moderate decrease in global wall motion, grade 2 diastolic impairment, mildly dilated left atrium, mild MR and TR. -04/23/2020: Left ventricle cavity is normal in size. Moderate concentric hypertrophy of the left ventricle. Moderate global hypokinesis. LVEF 35-40%. Indeterminate diastolic function due to E/A fusion.  Moderately dilated left atrium, mild MR. -R/LHC 12/2020 Nonischemic cardiomyopathy with severe LV systolic dysfunction.  Mild to moderate pulmonary hypertension secondary to diastolic dysfunction with LV EDP elevation -ECHO 12/2020  EF 35-40%, normal RV function/size, no sig valve disease -NYHA Class II-III symptoms, she is euvolemic on exam  -BP elevated today on excellent GDMT but not taking appropriately only took farxiga this am. Myself and the  pharmacist spent copious time on education gave her a pillbox and organized this for her -Continue entresto 97/103, metoprolol succinate 100mg , farxiga, spironolactone 25mg  -educated her on fluid restriction, she was too strict at one bottle of water all day, doing well with  decreased sodium intake -check bmp today  -recommend she gets a sleep study high risk for having OSA -Consider referral to EP for CRT-D therapy given LBBB and QRS >175ms with NYHA class II symptoms -Continue GDMT titration  CAD:  -diffuse nonobstructive disease by 12/2020 LHC -continue asa, statin, zetia  T2DM: -A1C 6.8 -continue farxiga, rest per pcp   Follow up with general cardiology

## 2021-01-28 NOTE — Patient Instructions (Addendum)
Please use the provided pill box and medication chart to take your medications correctly  Labs done today, we will call you for abnormal results  Thank you for allowing [place] to provider your heart failure care after your recent hospitalization. Please follow-up with [facility] as scheduled on 02/06/21  If you have any questions, issues, or concerns before your next appointment please call our office at [phone], opt. 2 and leave a message for the triage nurse.  Do the following things EVERYDAY: 1) Weigh yourself in the morning before breakfast. Write it down and keep it in a log. 2) Take your medicines as prescribed 3) Eat low salt foods--Limit salt (sodium) to 2000 mg per day.  4) Stay as active as you can everyday 5) Limit all fluids for the day to less than 2 liters  Limit your fluid intake to less than 2 liters of fluid per day. Fluid includes all drinks, coffee, juice, ice chips, soup, jello, and all other liquids.  Restrict your sodium intake to less than 2000mg  per day. This will help prevent your body from holding onto fluid. Read food labels as a lot of canned and packaged foods have a lot of sodium.

## 2021-01-29 ENCOUNTER — Telehealth (HOSPITAL_COMMUNITY): Payer: Self-pay | Admitting: Internal Medicine

## 2021-01-29 ENCOUNTER — Encounter (HOSPITAL_COMMUNITY): Payer: Self-pay

## 2021-01-29 MED ORDER — SPIRONOLACTONE 25 MG PO TABS: 12.5000 mg | ORAL_TABLET | Freq: Every day | ORAL | 3 refills | Status: DC

## 2021-01-29 NOTE — Telephone Encounter (Signed)
Spoke with Felicia Hanson and went over her bmp results.  Cr a little worse which I expect was from her having too strict of a fluid restriction and not taking medications appropriately.  Potassium 4.9.  She will hold lasix for today, also she will decrease spironolactone to 12.5mg  daily.  Will need repeat labs and medication education at follow up visit.

## 2021-01-29 NOTE — Telephone Encounter (Signed)
done

## 2021-02-03 NOTE — Telephone Encounter (Signed)
Please have her follow-up with PCP regarding this.

## 2021-02-03 NOTE — Telephone Encounter (Signed)
Spoke to patient she is aware patient stated she is feeling much better

## 2021-02-06 ENCOUNTER — Other Ambulatory Visit: Payer: Self-pay

## 2021-02-06 ENCOUNTER — Ambulatory Visit: Payer: Medicare Other | Admitting: Cardiology

## 2021-02-06 ENCOUNTER — Encounter: Payer: Self-pay | Admitting: Cardiology

## 2021-02-06 VITALS — BP 129/80 | HR 85 | Temp 97.7°F | Resp 17 | Ht 60.0 in | Wt 168.2 lb

## 2021-02-06 DIAGNOSIS — I5042 Chronic combined systolic (congestive) and diastolic (congestive) heart failure: Secondary | ICD-10-CM

## 2021-02-06 DIAGNOSIS — I447 Left bundle-branch block, unspecified: Secondary | ICD-10-CM

## 2021-02-06 DIAGNOSIS — I2510 Atherosclerotic heart disease of native coronary artery without angina pectoris: Secondary | ICD-10-CM

## 2021-02-06 DIAGNOSIS — I428 Other cardiomyopathies: Secondary | ICD-10-CM

## 2021-02-06 DIAGNOSIS — E1165 Type 2 diabetes mellitus with hyperglycemia: Secondary | ICD-10-CM

## 2021-02-06 DIAGNOSIS — R0609 Other forms of dyspnea: Secondary | ICD-10-CM

## 2021-02-06 DIAGNOSIS — Z794 Long term (current) use of insulin: Secondary | ICD-10-CM

## 2021-02-06 DIAGNOSIS — E7800 Pure hypercholesterolemia, unspecified: Secondary | ICD-10-CM

## 2021-02-06 DIAGNOSIS — R0600 Dyspnea, unspecified: Secondary | ICD-10-CM

## 2021-02-06 DIAGNOSIS — F17210 Nicotine dependence, cigarettes, uncomplicated: Secondary | ICD-10-CM

## 2021-02-06 DIAGNOSIS — I10 Essential (primary) hypertension: Secondary | ICD-10-CM

## 2021-02-06 MED ORDER — MAGNESIUM OXIDE 400 (241.3 MG) MG PO TABS: 200.0000 mg | ORAL_TABLET | Freq: Two times a day (BID) | ORAL | 0 refills | Status: DC

## 2021-02-06 NOTE — Progress Notes (Signed)
Date:  02/06/2021   ID:  Felicia Hanson, DOB 21-Sep-1956, MRN [id number]  PCP:  Shade Flood, MD  Cardiologist:  Tessa Lerner, DO, [facility] (established care 04/22/2020)  Date: 02/06/21 Last Office Visit: 06/21/2020  Chief Complaint  Patient presents with  . Congestive Heart Failure  . Hospitalization Follow-up    HPI   Felicia Hanson is a 65 y.o. female who presents to the office with a  chief complaint of " congestive heart failure management and hospital follow-up."  Her past medical history and cardiovascular risk factors are: insulin-dependent type 2 diabetes with hyperglycemia, pure hypercholesterolemia, hypertriglyceridemia, coronary artery calcification, nonischemic cardiomyopathy, left bundle branch block, essential hypertension, hypothyroidism, tobacco use disorder, vitamin D deficiency, generalized anxiety disorder.  Since last office visit patient was hospitalized in February 2022 with a chief complaint of shortness of breath.  Patient was found to be in respiratory distress associated with hypoxia requiring BiPAP intervention.  She was diuresed more than negative net 5 L with IV diuresis.  Echocardiogram noted reduced LVEF with diastolic dysfunction.  Given her symptoms, acute heart failure exacerbation, prior ischemic evaluation the shared decision was to proceed with left and right heart catheterization.  Patient tolerated the procedure well without any immediate complications.  Her medications were uptitrated to GDMT and was later discharged home with close follow-up.  This was supposed to be a transition of care follow-up visit but it appears that the patient has already seen providers at [facility] since discharge.  Since discharge patient states that she does not have any chest pain or anginal equivalent.  Her shortness of breath has significantly improved but still present with effort related activities.  Has not required sublingual nitroglycerin tablets.  Patient would like  me to fill out prior authorization forms for [place].  Patient states that she continues to smoke 1 cigarette/day.  No family history of premature coronary disease or sudden cardiac death.  FUNCTIONAL STATUS: No structured exercise program or daily routine.  But does actively take care of her grandkids.  ALLERGIES: Allergies  Allergen Reactions  . Metformin And Related Anaphylaxis    Could not swallow  . Nicotine Rash    Only allergic to nicotine patch  . Ace Inhibitors Cough    Cough with lisinopril.     MEDICATION LIST PRIOR TO VISIT: Current Meds  Medication Sig  . ACCU-CHEK AVIVA PLUS test strip daily. for testing  . albuterol (VENTOLIN HFA) 108 (90 Base) MCG/ACT inhaler Inhale 1 puff into the lungs every 6 (six) hours as needed for wheezing or shortness of breath.  . ALPRAZolam (XANAX) 1 MG tablet Take 0.5 mg by mouth 3 (three) times daily as needed for anxiety or sleep.   Marland Kitchen aspirin 81 MG tablet Take 81 mg by mouth daily.  Marland Kitchen BLACK ELDERBERRY PO Take 10 mLs by mouth daily.  . Continuous Blood Gluc Receiver (DEXCOM G6 RECEIVER) DEVI 1 Device by Does not apply route every morning. Device and supplies  . dapagliflozin propanediol (FARXIGA) 10 MG TABS tablet Take 1 tablet (10 mg total) by mouth daily.  . DULoxetine (CYMBALTA) 60 MG capsule Take 2 capsules (120 mg total) by mouth daily.  Marland Kitchen ezetimibe (ZETIA) 10 MG tablet Take 1 tablet (10 mg total) by mouth daily.  . famotidine (PEPCID) 20 MG tablet Take 20 mg by mouth daily as needed for heartburn or indigestion.  . insulin glargine (LANTUS SOLOSTAR) 100 UNIT/ML Solostar Pen Inject 38 Units into the skin daily.  Marland Kitchen  Insulin Pen Needle 31G X 5 MM MISC Inject 1 each into the skin daily.  Marland Kitchen levothyroxine (SYNTHROID) 88 MCG tablet TAKE 1 TABLET (88 MCG TOTAL) BY MOUTH DAILY BEFORE BREAKFAST.  . metoprolol succinate (TOPROL-XL) 100 MG 24 hr tablet Take 1 tablet (100 mg total) by mouth daily. Take with or immediately following  a meal.  . naproxen sodium (ALEVE) 220 MG tablet Take 220 mg by mouth daily as needed.  . nitroGLYCERIN (NITROSTAT) 0.4 MG SL tablet PLACE 1 TABLET UNDER THE TONGUE EVERY 5 MINUTES AS NEEDED FOR CHEST PAIN. IF YOU REQUIRE MORE THAN TWO TABLETS FIVE MINUTES APART GO TO THE NEAREST ER VIA EMS. (Patient taking differently: Place 0.4 mg under the tongue every 5 (five) minutes as needed for chest pain.)  . ondansetron (ZOFRAN ODT) 4 MG disintegrating tablet Take 1 tablet (4 mg total) by mouth every 8 (eight) hours as needed for nausea or vomiting.  . pravastatin (PRAVACHOL) 80 MG tablet Take 1 tablet (80 mg total) by mouth every evening.  . sacubitril-valsartan (ENTRESTO) 97-103 MG Take 1 tablet by mouth 2 (two) times daily.  Marland Kitchen spironolactone (ALDACTONE) 25 MG tablet Take 0.5 tablets (12.5 mg total) by mouth daily. (Patient taking differently: Take 25 mg by mouth daily.)  . Vitamin D, Ergocalciferol, (DRISDOL) 1.25 MG (50000 UNIT) CAPS capsule TAKE 1 CAPSULE (50,000 UNITS TOTAL) BY MOUTH EVERY 7 (SEVEN) DAYS.  . [DISCONTINUED] magnesium oxide (MAG-OX) 400 (241.3 Mg) MG tablet Take 0.5 tablets (200 mg total) by mouth 2 (two) times daily.     PAST MEDICAL HISTORY: Past Medical History:  Diagnosis Date  . Anxiety   . Coronary artery calcification   . Depression   . Diabetes mellitus without complication (HCC)   . Hyperlipidemia   . Hypertension   . Thyroid disease     PAST SURGICAL HISTORY: Past Surgical History:  Procedure Laterality Date  . BREAST EXCISIONAL BIOPSY Left   . CHOLECYSTECTOMY    . COLON SURGERY     diverticulitis with perforation; s/p colon resection.  . Colonoscopy    . RIGHT/LEFT HEART CATH AND CORONARY ANGIOGRAPHY N/A 01/22/2021   Procedure: RIGHT/LEFT HEART CATH AND CORONARY ANGIOGRAPHY;  Surgeon: Yates Decamp, MD;  Location: [facility] INVASIVE CV LAB;  Service: Cardiovascular;  Laterality: N/A;  . TUBAL LIGATION      FAMILY HISTORY: The patient family history includes Breast  cancer in her paternal aunt; Cancer in her mother; Hyperlipidemia in her mother.  SOCIAL HISTORY:  The patient  reports that she has been smoking cigarettes. She has a 9.50 pack-year smoking history. She has never used smokeless tobacco. She reports that she does not drink alcohol and does not use drugs.  REVIEW OF SYSTEMS: Review of Systems  Constitutional: Negative for chills and fever.  HENT: Negative for hoarse voice and nosebleeds.   Eyes: Negative for discharge, double vision and pain.  Cardiovascular: Positive for dyspnea on exertion. Negative for chest pain, claudication, leg swelling, near-syncope, orthopnea, palpitations, paroxysmal nocturnal dyspnea and syncope.  Respiratory: Negative for hemoptysis and shortness of breath.   Musculoskeletal: Negative for muscle cramps and myalgias.  Gastrointestinal: Negative for abdominal pain, constipation, diarrhea, hematemesis, hematochezia, melena, nausea and vomiting.  Neurological: Negative for dizziness and light-headedness.    PHYSICAL EXAM: Vitals with BMI 02/06/2021 01/28/2021 01/24/2021  Height 5\' 0"  - -  Weight 168 lbs 3 oz 163 lbs -  BMI 32.85 31.83 -  Systolic 129 152  Diastolic 80 76 79  Pulse 85  70 71    CONSTITUTIONAL: Well-developed and well-nourished. No acute distress.  SKIN: Skin is warm and dry. No rash noted. No cyanosis. No pallor. No jaundice HEAD: Normocephalic and atraumatic.  EYES: No scleral icterus MOUTH/THROAT: Moist oral membranes.  NECK: No JVD present. No thyromegaly noted. No carotid bruits  LYMPHATIC: No visible cervical adenopathy.  CHEST Normal respiratory effort. No intercostal retractions  LUNGS: Clear to auscultation bilaterally.  No stridor. No wheezes. No rales.  CARDIOVASCULAR: Regular rate and rhythm, positive S1-S2, no murmurs rubs or gallops appreciated. ABDOMINAL: Obese, soft, nontender, nondistended, positive bowel sounds in all 4 quadrants.  No apparent ascites.  EXTREMITIES: No  peripheral edema  HEMATOLOGIC: No significant bruising NEUROLOGIC: Oriented to person, place, and time. Nonfocal. Normal muscle tone.  PSYCHIATRIC: Normal mood and affect. Normal behavior. Cooperative  CARDIAC DATABASE:  EKG:  04/22/2020: Normal sinus rhythm, 77 bpm, left axis deviation, left bundle branch block, left atrial enlargement, poor R wave progression.  Coronary calcium scoring 05/13/2015: LAD: 8, Cx: 2. Total calcium score is 10.   Echocardiogram: 01/20/2021:  1. Left ventricular ejection fraction, by estimation, is 35 to 40%. The left ventricle has moderately decreased function. There is mild left ventricular hypertrophy. Indeterminate diastolic filling due to E-A fusion.  2. Right ventricular systolic function is normal. The right ventricular size is normal.  3. The mitral valve is abnormal. Trivial mitral valve regurgitation.  4. The aortic valve is abnormal. Aortic valve regurgitation is not visualized. Mild to moderate aortic valve sclerosis/calcification is present, without any evidence of aortic stenosis.   Stress Testing: Lexiscan Tetrofosmin Stress Test 05/13/2020:  There is a fixed mild defect in the inferior region due to diaphragmatic attenuation. No ischemia or scar.  The LV is moderately dilated with LV end diastolic volume of 173 mL.  Overall LV systolic function is abnormal without regional wall motion abnormalities. Stress LV EF: 21%.   High risk study due to low LVEF. Findings suggest non ischemic dilated cardiomyopathy.  Heart Catheterization: Right + left heart catheterization 01/22/2021: RA 16/12, mean 11 mmHg, RA saturation 80%. RV 47/8, EDP 15 mmHg. PA 41/26, mean 33 mmHg. PA saturation 77%. PW 32/26, mean 29 mmHg. Aortic saturation 98%. Cardiac output 6.58, cardiac index 3.80 by Fick. Stroke-volume 70 mL. SVR 16.87, PVR 1.05. QP/QS 0.86. LV: Global hypokinesis, EF 35%. EDP markedly elevated at 32 mmHg. There was no pressure gradient across  the aortic valve. Left main: Mild disease. LAD: Mild diffuse disease. CX: Mild diffuse disease. RI: Mild diffuse disease. RCA: Dominant, mild diffuse disease.  Impression: Nonischemic cardiomyopathy with severe LV systolic dysfunction. Mild to moderate pulmonary hypertension secondary to diastolic dysfunction with LV EDP elevation.  LABORATORY DATA: CBC Latest Ref Rng & Units 01/24/2021 01/23/2021 01/22/2021  WBC 4.0 - 10.5 K/uL 15.3(H) 13.9(H) -  Hemoglobin 12.0 - 15.0 g/dL 15.7(H) 15.2(H) 13.9  Hematocrit 36.0 - 46.0 % 44.9 44.3 41.0  Platelets 150 - 400 K/uL 221 201 -    CMP Latest Ref Rng & Units 01/28/2021 01/24/2021 01/24/2021  Glucose 70 - 99 mg/dL 244(W) 102(V) 253(G)  BUN 8 - 23 mg/dL 12 16 15   Creatinine 0.44 - 1.00 mg/dL 6.44(I) 3.47 4.25(Z)  Sodium 135 - 145 mmol/L 139 137 137  Potassium 3.5 - 5.1 mmol/L 4.9 3.3(L) 2.7(LL)  Chloride 98 - 111 mmol/L 97(L) 94(L) 94(L)  CO2 22 - 32 mmol/L 31 29 31   Calcium 8.9 - 10.3 mg/dL 9.7 5.6(L) 8.7(F)  Total Protein 6.5 - 8.1 g/dL - - -  Total Bilirubin 0.3 - 1.2 mg/dL - - -  Alkaline Phos 38 - 126 U/L - - -  AST 15 - 41 U/L - - -  ALT 0 - 44 U/L - - -    Lipid Panel     Component Value Date/Time   CHOL 153 12/06/2020 1638   TRIG 158 (H) 12/06/2020 1638   HDL 46 12/06/2020 1638   CHOLHDL 3.3 12/06/2020 1638   CHOLHDL 5.0 08/09/2015 1504   VLDL 44 (H) 08/09/2015 1504   LDLCALC 80 12/06/2020 1638   LABVLDL 27 12/06/2020 1638   Lipid Panel Recent Labs    04/03/20 1757 12/06/20 1638  CHOL 189 153  TRIG 248* 158*  LDLCALC 98 80  HDL 49 46  CHOLHDL 3.9 3.3    Lab Results  Component Value Date   HGBA1C 6.8 (H) 12/06/2020   HGBA1C 9.1 (A) 09/06/2020   HGBA1C 11.5 (A) 07/08/2020   No components found for: NTPROBNP Lab Results  Component Value Date   TSH 0.294 (L) 01/20/2021   TSH 1.160 09/06/2020   TSH 0.732 04/03/2020    IMPRESSION:    ICD-10-CM   1. Chronic combined systolic and diastolic heart failure  (HCC)  I50.42 magnesium oxide (MAG-OX) 400 (241.3 Mg) MG tablet    Basic metabolic panel    Magnesium    Pro b natriuretic peptide (BNP)    Ambulatory referral to Neurology  2. Coronary artery calcification seen on computed tomography  I25.10   3. Nonischemic cardiomyopathy (HCC)  I42.8 Ambulatory referral to Neurology  4. Essential hypertension, benign  I10 EKG 12-Lead  5. LBBB (left bundle branch block)  I44.7   6. Hypercholesterolemia  E78.00   7. Type 2 diabetes mellitus with hyperglycemia, with long-term current use of insulin (HCC)  E11.65    Z79.4   8. Long-term insulin use (HCC)  Z79.4   9. Dyspnea on exertion  R06.00   10. Cigarette smoker  F17.210      RECOMMENDATIONS: OMARA ALCON is a 65 y.o. female whose past medical history and cardiac risk factors include: insulin-dependent type 2 diabetes with hyperglycemia, pure hypercholesterolemia, hypertriglyceridemia, coronary artery calcification, nonischemic cardiomyopathy, left bundle branch block, essential hypertension, hypothyroidism, tobacco use disorder, vitamin D deficiency, generalized anxiety disorder.  Chronic combined systolic and diastolic heart failure, stage C, NYHA class II: Last hospitalization for congestive heart failure February 2022. Asymptomatic currently. Medications reconciled. Prior authorizations for [employer] completed. Patient will be enrolled into principal care management for congestive heart failure. Refer to Dr. Vickey Huger for sleep study. Given the recent up titration of guideline directed medical therapy we will check a BMP, proBNP, and magnesium levels. Plan to order a follow-up echo in 90 days to reevaluate LVEF. We will consider CRT therapy in the setting of reduced LVEF, left bundle branch block, and a QRS duration greater than 120 ms.  Nonischemic cardiomyopathy: See plan above  Calcified atherosclerotic coronary artery disease: Continue current medical therapy.  Educated on the  importance of risk factor modifications.  Educated on the importance of complete smoking cessation.  We will continue to follow.  Left bundle branch block: Monitor for now.   Non-insulin-dependent diabetes mellitus type 2: Most recent hemoglobin A1c reviewed.  Currently managed per primary team.  Benign essential hypertension: Currently managed by primary provider. Educated on importance of low-salt diet.  Active tobacco use: Since discharge from the hospital she continues to smoke no more than 1 cigarette/day.  Patient states that she plans  to completely wean off in the next couple weeks to months but currently needs to smoke 1 cigarette to calm her nerves.  I have asked her to discuss this further with PCP.   Total encounter time 45 minutes.  Time spent reviewing her hospitalization records, echocardiogram report, heart catheterization report, discharge summary, performing medication reconciliation, prior office authorizations for Sherryll Burger and Marcelline Deist filled out, discussing plan of care and coordination of care as well.  FINAL MEDICATION LIST END OF ENCOUNTER: Meds ordered this encounter  Medications  . magnesium oxide (MAG-OX) 400 (241.3 Mg) MG tablet    Sig: Take 0.5 tablets (200 mg total) by mouth 2 (two) times daily.    Dispense:  10 tablet    Refill:  0    Medications Discontinued During This Encounter  Medication Reason  . triamcinolone cream (KENALOG) 0.5 % Error  . betamethasone dipropionate 0.05 % lotion Error  . cetirizine (ZYRTEC) 10 MG tablet Error  . magnesium oxide (MAG-OX) 400 (241.3 Mg) MG tablet Reorder     Current Outpatient Medications:  .  ACCU-CHEK AVIVA PLUS test strip, daily. for testing, Disp: , Rfl: 2 .  albuterol (VENTOLIN HFA) 108 (90 Base) MCG/ACT inhaler, Inhale 1 puff into the lungs every 6 (six) hours as needed for wheezing or shortness of breath., Disp: , Rfl:  .  ALPRAZolam (XANAX) 1 MG tablet, Take 0.5 mg by mouth 3 (three) times daily as needed for  anxiety or sleep. , Disp: , Rfl:  .  aspirin 81 MG tablet, Take 81 mg by mouth daily., Disp: , Rfl:  .  BLACK ELDERBERRY PO, Take 10 mLs by mouth daily., Disp: , Rfl:  .  Continuous Blood Gluc Receiver (DEXCOM G6 RECEIVER) DEVI, 1 Device by Does not apply route every morning. Device and supplies, Disp: 1 each, Rfl: 0 .  dapagliflozin propanediol (FARXIGA) 10 MG TABS tablet, Take 1 tablet (10 mg total) by mouth daily., Disp: 30 tablet, Rfl: 3 .  DULoxetine (CYMBALTA) 60 MG capsule, Take 2 capsules (120 mg total) by mouth daily., Disp: 180 capsule, Rfl: 1 .  ezetimibe (ZETIA) 10 MG tablet, Take 1 tablet (10 mg total) by mouth daily., Disp: 30 tablet, Rfl: 2 .  famotidine (PEPCID) 20 MG tablet, Take 20 mg by mouth daily as needed for heartburn or indigestion., Disp: , Rfl:  .  insulin glargine (LANTUS SOLOSTAR) 100 UNIT/ML Solostar Pen, Inject 38 Units into the skin daily., Disp: 45 mL, Rfl: 1 .  Insulin Pen Needle 31G X 5 MM MISC, Inject 1 each into the skin daily., Disp: 100 each, Rfl: 2 .  levothyroxine (SYNTHROID) 88 MCG tablet, TAKE 1 TABLET (88 MCG TOTAL) BY MOUTH DAILY BEFORE BREAKFAST., Disp: 90 tablet, Rfl: 1 .  metoprolol succinate (TOPROL-XL) 100 MG 24 hr tablet, Take 1 tablet (100 mg total) by mouth daily. Take with or immediately following a meal., Disp: 30 tablet, Rfl: 3 .  naproxen sodium (ALEVE) 220 MG tablet, Take 220 mg by mouth daily as needed., Disp: , Rfl:  .  nitroGLYCERIN (NITROSTAT) 0.4 MG SL tablet, PLACE 1 TABLET UNDER THE TONGUE EVERY 5 MINUTES AS NEEDED FOR CHEST PAIN. IF YOU REQUIRE MORE THAN TWO TABLETS FIVE MINUTES APART GO TO THE NEAREST ER VIA EMS. (Patient taking differently: Place 0.4 mg under the tongue every 5 (five) minutes as needed for chest pain.), Disp: 25 tablet, Rfl: 1 .  ondansetron (ZOFRAN ODT) 4 MG disintegrating tablet, Take 1 tablet (4 mg total) by mouth every  8 (eight) hours as needed for nausea or vomiting., Disp: 20 tablet, Rfl: 1 .  pravastatin  (PRAVACHOL) 80 MG tablet, Take 1 tablet (80 mg total) by mouth every evening., Disp: 90 tablet, Rfl: 1 .  sacubitril-valsartan (ENTRESTO) 97-103 MG, Take 1 tablet by mouth 2 (two) times daily., Disp: 60 tablet, Rfl: 3 .  spironolactone (ALDACTONE) 25 MG tablet, Take 0.5 tablets (12.5 mg total) by mouth daily. (Patient taking differently: Take 25 mg by mouth daily.), Disp: 30 tablet, Rfl: 3 .  Vitamin D, Ergocalciferol, (DRISDOL) 1.25 MG (50000 UNIT) CAPS capsule, TAKE 1 CAPSULE (50,000 UNITS TOTAL) BY MOUTH EVERY 7 (SEVEN) DAYS., Disp: 15 capsule, Rfl: 0 .  magnesium oxide (MAG-OX) 400 (241.3 Mg) MG tablet, Take 0.5 tablets (200 mg total) by mouth 2 (two) times daily., Disp: 10 tablet, Rfl: 0  Orders Placed This Encounter  Procedures  . Basic metabolic panel  . Magnesium  . Pro b natriuretic peptide (BNP)  . Ambulatory referral to Neurology  . EKG 12-Lead   --Continue cardiac medications as reconciled in final medication list. --No follow-ups on file. Or sooner if needed. --Continue follow-up with your primary care physician regarding the management of your other chronic comorbid conditions.  Patient's questions and concerns were addressed to her satisfaction. She voices understanding of the instructions provided during this encounter.   This note was created using a voice recognition software as a result there may be grammatical errors inadvertently enclosed that do not reflect the nature of this encounter. Every attempt is made to correct such errors.  Tessa LernerSunit Furqan Gosselin, [place]DO, [facility] [place]ACC  Pager: [phone] Office: [phone]

## 2021-02-10 ENCOUNTER — Other Ambulatory Visit: Payer: Self-pay | Admitting: Family Medicine

## 2021-02-10 DIAGNOSIS — E1165 Type 2 diabetes mellitus with hyperglycemia: Secondary | ICD-10-CM

## 2021-02-10 MED ORDER — INSULIN PEN NEEDLE 31G X 5 MM MISC: 1.0000 | Freq: Every day | 2 refills | Status: AC

## 2021-02-10 NOTE — Telephone Encounter (Signed)
Medication Refill - Medication: Insulin Pen Needle 31G X 5 MM MISC     Preferred Pharmacy (with phone number or street name):  [id number] [place], [place] - [address] Phone:  [phone]  Fax:  [phone]       Agent: Please be advised that RX refills may take up to 3 business days. We ask that you follow-up with your pharmacy.

## 2021-02-13 ENCOUNTER — Encounter: Payer: Self-pay | Admitting: Student

## 2021-02-13 ENCOUNTER — Telehealth: Payer: Self-pay

## 2021-02-13 ENCOUNTER — Telehealth: Payer: Self-pay | Admitting: Student

## 2021-02-13 LAB — BASIC METABOLIC PANEL
BUN/Creatinine Ratio: 10 — ABNORMAL LOW (ref 12–28)
BUN: 12 mg/dL (ref 8–27)
CO2: 19 mmol/L — ABNORMAL LOW (ref 20–29)
Calcium: 9.5 mg/dL (ref 8.7–10.3)
Chloride: 89 mmol/L — ABNORMAL LOW (ref 96–106)
Creatinine, Ser: 1.22 mg/dL — ABNORMAL HIGH (ref 0.57–1.00)
Glucose: 553 mg/dL (ref 65–99)
Potassium: 4.6 mmol/L (ref 3.5–5.2)
Sodium: 131 mmol/L — ABNORMAL LOW (ref 134–144)
eGFR: 49 mL/min/{1.73_m2} — ABNORMAL LOW (ref >59)

## 2021-02-13 LAB — PRO B NATRIURETIC PEPTIDE: NT-Pro BNP: 272 pg/mL (ref 0–301)

## 2021-02-13 LAB — MAGNESIUM: Magnesium: 2.1 mg/dL (ref 1.6–2.3)

## 2021-02-13 NOTE — Telephone Encounter (Signed)
At his office on call, I received an alert from lab core earlier on 317/2022 at 1:55 AM for notification of critical glucose level at 553.  I therefore attempted to call patient this morning to review, however she was unavailable.  Left message with patient's family to have her reach out to our office when available.

## 2021-02-13 NOTE — Telephone Encounter (Signed)
-----   Message from [place], [place] sent at 02/13/2021  7:06 AM EDT ----- Please call the patient and inform her that her blood glucose is too high. She should speak to her PCP or endocrinology for titration of insulin.   Kidney function is improving.   Correct sodium is within normal limits (Na 141).   Per patient's request cc a copy to both her PCP and endocrinologist.

## 2021-02-14 ENCOUNTER — Ambulatory Visit (INDEPENDENT_AMBULATORY_CARE_PROVIDER_SITE_OTHER): Payer: Medicare Other | Admitting: Family Medicine

## 2021-02-14 ENCOUNTER — Encounter: Payer: Self-pay | Admitting: Family Medicine

## 2021-02-14 ENCOUNTER — Other Ambulatory Visit: Payer: Self-pay

## 2021-02-14 VITALS — BP 136/78 | HR 84 | Temp 98.0°F | Ht 60.0 in | Wt 166.0 lb

## 2021-02-14 DIAGNOSIS — K7689 Other specified diseases of liver: Secondary | ICD-10-CM

## 2021-02-14 DIAGNOSIS — R059 Cough, unspecified: Secondary | ICD-10-CM | POA: Diagnosis not present

## 2021-02-14 DIAGNOSIS — D72829 Elevated white blood cell count, unspecified: Secondary | ICD-10-CM

## 2021-02-14 DIAGNOSIS — I5043 Acute on chronic combined systolic (congestive) and diastolic (congestive) heart failure: Secondary | ICD-10-CM

## 2021-02-14 DIAGNOSIS — R739 Hyperglycemia, unspecified: Secondary | ICD-10-CM

## 2021-02-14 DIAGNOSIS — R0603 Acute respiratory distress: Secondary | ICD-10-CM | POA: Diagnosis not present

## 2021-02-14 DIAGNOSIS — F411 Generalized anxiety disorder: Secondary | ICD-10-CM

## 2021-02-14 DIAGNOSIS — E1165 Type 2 diabetes mellitus with hyperglycemia: Secondary | ICD-10-CM

## 2021-02-14 NOTE — Patient Instructions (Addendum)
Keep follow up as planned with cardiology. Be seen if new shortness of breath, swelling or any chest pains.   Chest xray at [facility] - [address] If cough worsens or new  shortness of breath - be seen right away.   Monitor blood sugars - if remaining over 200, then increase insulin by 2 units. Stay on same dose if in low to mid 100's.   Please contact psychiatrist if continued difficulty with anxiety, but see info below as well. I do recommend meeting with therapist.     Please have bloodwork on Monday:  Here are a few lab drawing stations for you to have your labwork performed:  [facility] [address] [place], [place] [address]  [facility] [address] [place], [place] [address]   Return to the clinic or go to the nearest emergency room if any of your symptoms worsen or new symptoms occur.   Managing Anxiety, Adult After being diagnosed with an anxiety disorder, you may be relieved to know why you have felt or behaved a certain way. You may also feel overwhelmed about the treatment ahead and what it will mean for your life. With care and support, you can manage this condition and recover from it. How to manage lifestyle changes Managing stress and anxiety Stress is your body's reaction to life changes and events, both good and bad. Most stress will last just a few hours, but stress can be ongoing and can lead to more than just stress. Although stress can play a major role in anxiety, it is not the same as anxiety. Stress is usually caused by something external, such as a deadline, test, or competition. Stress normally passes after the triggering event has ended.  Anxiety is caused by something internal, such as imagining a terrible outcome or worrying that something will go wrong that will devastate you. Anxiety often does not go away even after the triggering event is over, and it can become long-term (chronic) worry. It is important to understand the  differences between stress and anxiety and to manage your stress effectively so that it does not lead to an anxious response. Talk with your health care provider or a counselor to learn more about reducing anxiety and stress. He or she may suggest tension reduction techniques, such as:  Music therapy. This can include creating or listening to music that you enjoy and that inspires you.  Mindfulness-based meditation. This involves being aware of your normal breaths while not trying to control your breathing. It can be done while sitting or walking.  Centering prayer. This involves focusing on a word, phrase, or sacred image that means something to you and brings you peace.  Deep breathing. To do this, expand your stomach and inhale slowly through your nose. Hold your breath for 3-5 seconds. Then exhale slowly, letting your stomach muscles relax.  Self-talk. This involves identifying thought patterns that lead to anxiety reactions and changing those patterns.  Muscle relaxation. This involves tensing muscles and then relaxing them. Choose a tension reduction technique that suits your lifestyle and personality. These techniques take time and practice. Set aside 5-15 minutes a day to do them. Therapists can offer counseling and training in these techniques. The training to help with anxiety may be covered by some insurance plans. Other things you can do to manage stress and anxiety include:  Keeping a stress/anxiety diary. This can help you learn what triggers your reaction and then learn ways to manage  your response.  Thinking about how you react to certain situations. You may not be able to control everything, but you can control your response.  Making time for activities that help you relax and not feeling guilty about spending your time in this way.  Visual imagery and yoga can help you stay calm and relax.   Medicines Medicines can help ease symptoms. Medicines for anxiety  include:  Anti-anxiety drugs.  Antidepressants. Medicines are often used as a primary treatment for anxiety disorder. Medicines will be prescribed by a health care provider. When used together, medicines, psychotherapy, and tension reduction techniques may be the most effective treatment. Relationships Relationships can play a big part in helping you recover. Try to spend more time connecting with trusted friends and family members. Consider going to couples counseling, taking family education classes, or going to family therapy. Therapy can help you and others better understand your condition. How to recognize changes in your anxiety Everyone responds differently to treatment for anxiety. Recovery from anxiety happens when symptoms decrease and stop interfering with your daily activities at home or work. This may mean that you will start to:  Have better concentration and focus. Worry will interfere less in your daily thinking.  Sleep better.  Be less irritable.  Have more energy.  Have improved memory. It is important to recognize when your condition is getting worse. Contact your health care provider if your symptoms interfere with home or work and you feel like your condition is not improving. Follow these instructions at home: Activity  Exercise. Most adults should do the following: ? Exercise for at least 150 minutes each week. The exercise should increase your heart rate and make you sweat (moderate-intensity exercise). ? Strengthening exercises at least twice a week.  Get the right amount and quality of sleep. Most adults need 7-9 hours of sleep each night. Lifestyle  Eat a healthy diet that includes plenty of vegetables, fruits, whole grains, low-fat dairy products, and lean protein. Do not eat a lot of foods that are high in solid fats, added sugars, or salt.  Make choices that simplify your life.  Do not use any products that contain nicotine or tobacco, such as  cigarettes, e-cigarettes, and chewing tobacco. If you need help quitting, ask your health care provider.  Avoid caffeine, alcohol, and certain over-the-counter cold medicines. These may make you feel worse. Ask your pharmacist which medicines to avoid.   General instructions  Take over-the-counter and prescription medicines only as told by your health care provider.  Keep all follow-up visits as told by your health care provider. This is important. Where to find support You can get help and support from these sources:  Self-help groups.  Online and [employer].  A trusted spiritual leader.  Couples counseling.  Family education classes.  Family therapy. Where to find more information You may find that joining a support group helps you deal with your anxiety. The following sources can help you locate counselors or support groups near you:  Mental Health America: www.mentalhealthamerica.net  Anxiety and Depression Association of [place] (ADAA): ProgramCam.de  [employer]ness (NAMI): www.nami.org Contact a health care provider if you:  Have a hard time staying focused or finishing daily tasks.  Spend many hours a day feeling worried about everyday life.  Become exhausted by worry.  Start to have headaches, feel tense, or have nausea.  Urinate more than normal.  Have diarrhea. Get help right away if you have:  A  racing heart and shortness of breath.  Thoughts of hurting yourself or others. If you ever feel like you may hurt yourself or others, or have thoughts about taking your own life, get help right away. You can go to your nearest emergency department or call:  Your local emergency services (911 in the [place]  A suicide crisis helpline, such as the National Suicide Prevention Lifeline at (973)674-0887. This is open 24 hours a day. Summary  Taking steps to learn and use tension reduction techniques can help calm you and help  prevent triggering an anxiety reaction.  When used together, medicines, psychotherapy, and tension reduction techniques may be the most effective treatment.  Family, friends, and partners can play a big part in helping you recover from an anxiety disorder. This information is not intended to replace advice given to you by your health care provider. Make sure you discuss any questions you have with your health care provider. Document Revised: 04/18/2019 Document Reviewed: 04/18/2019 Elsevier Patient Education  2021 [employer].   If you have lab work done today you will be contacted with your lab results within the next 2 weeks.  If you have not heard from [place] then please contact us. The fastest way to get your results is to register for My Chart.   IF you received an x-ray today, you will receive an invoice from [facility]. Please contact [facility] at [phone] with questions or concerns regarding your invoice.   IF you received labwork today, you will receive an invoice from [facility]. Please contact [facility] at [phone] with questions or concerns regarding your invoice.   Our billing staff will not be able to assist you with questions regarding bills from these companies.  You will be contacted with the lab results as soon as they are available. The fastest way to get your results is to activate your My Chart account. Instructions are located on the last page of this paperwork. If you have not heard from [place] regarding the results in 2 weeks, please contact this office.

## 2021-02-14 NOTE — Progress Notes (Signed)
Subjective:  Patient ID: Felicia Hanson, female    DOB: 09-26-1956  Age: 65 y.o. MRN: [id number]  CC:  Chief Complaint  Patient presents with  . Hospitalization Follow-up    Pt went to the hospital on 01/20/2021. Pt reports prior to going she was experiencing an inability to to breath pt reports passing out a few times. Pt was hospitalized for 5 days. Pt was told she had acute heart heart failure. Since leaving the hospital the pt reports feeling pretty good other than some anxiety.    HPI Felicia Hanson presents for   Hospital follow-up.  Admitted 01/20/2021 through 01/24/2021  Acute respiratory failure, hypoxic with respiratory distress Secondary to acute on chronic combined systolic and diastolic heart failure exacerbation.  Possible asymmetric edema on chest x-ray with concern for infection/inflammatory as other possibility.  Treated with IV Lasix, required BiPAP.  Transition to room air.  IV Lasix was eventually stopped and transition to [place].  Negative 5 L.  Leukocytosis was noted but trending down.  She was given steroid by EMS. Hypokalemia was repleted IV and orally.  Outpatient potassium for 2 days. Repeat K ok 2 days ago. On magnesium, no potassium supplement at [facility] time.  Breathing ok. No new swelling - prior swelling has resolved. No chest pains.  WBC 15.3 on 2.25 - peak of 20 on 01/21/21. No fever. No abd pain, no dysuria.  New cough - slight cough past few days. Very mild. Other sick contacts at home - husband, granddaughter. No recent covid 19 exposure.    Lab Results  Component Value Date   K 4.6 02/12/2021   Wt Readings from Last 3 Encounters:  02/14/21 166 lb (75.3 kg)  02/06/21 168 lb 3.2 oz (76.3 kg)  01/28/21 163 lb (73.9 kg)   Lab Results  Component Value Date   WBC 15.3 (H) 01/24/2021   HGB 15.7 (H) 01/24/2021   HCT 44.9 01/24/2021   MCV 85.4 01/24/2021   PLT 221 01/24/2021      Acute on chronic systolic and diastolic heart failure Echo 35 to 40%  with indeterminant diastolic filling.  Continue spironolactone, Entresto, followed by cardiology,  with cardiac cath on 01/22/2021.  Nonischemic cardiomyopathy with severe LV systolic dysfunction, mild to moderate pulmonary hypertension secondary to diastolic dysfunction with LVEDP elevation.  Mild disease in the left main, LAD, circumflex, RI, and RCA. Cardiac follow-up on March 10, note reviewed.  Prior authorizations for [employer] were completed.  Enrolled into principal care management for CHF.  Referred to Dr. Vickey Huger for sleep study.  Plan for repeat echo in 90 days to reevaluate LVEF.  Considering CRT therapy in the setting of reduced EF, left bundle branch block and a QRS duration greater than 120 MS.  Smoking cessation also discussed.  1 cigarette/day at her last cardiology visit.  Planning to wean further.   Generalized anxiety Somewhat limiting her ability to quit smoking as it has been used to calm her nerves.  Has been treated with Cymbalta, episodic Xanax previously. Took 1/3 last night. Prior not in past few years.  More anxious since being in hospital.  Followed by psychiatry - has discussed increased anxiety. Unable to see therapist - al virtual.   Diabetes Lantus and sliding scale given during hospitalization, started on Farxiga as above. Stopped trulicity.   Glucose 105 on March 1, 553 2 days ago -  Off lantus possibly night before. missed one day -Ran out of lancets.  Back on  lantus 38 units per day. Had piece of cake in past week - usually avoiding sugary foods.  farxiga 10mg  per day.  Glucose 289 last night. Range 120-169 usually.  No symptomatic lows.    Lab Results  Component Value Date   HGBA1C 6.8 (H) 12/06/2020     History Patient Active Problem List   Diagnosis Date Noted  . Atypical chest pain 01/22/2021  . Respiratory distress 01/20/2021  . Acute on chronic combined systolic and diastolic CHF (congestive heart failure) (HCC) 01/20/2021  . Type 2  diabetes mellitus without complication, without long-term current use of insulin (HCC) 10/26/2017  . Generalized anxiety disorder 01/10/2014  . Vitamin D deficiency 01/10/2014  . Essential hypertension, benign 01/10/2014  . Pure hypercholesterolemia 01/10/2014  . Hypothyroidism 01/10/2014   Past Medical History:  Diagnosis Date  . Anxiety   . Coronary artery calcification   . Depression   . Diabetes mellitus without complication (HCC)   . Hyperlipidemia   . Hypertension   . Thyroid disease    Past Surgical History:  Procedure Laterality Date  . BREAST EXCISIONAL BIOPSY Left   . CHOLECYSTECTOMY    . COLON SURGERY     diverticulitis with perforation; s/p colon resection.  . Colonoscopy    . RIGHT/LEFT HEART CATH AND CORONARY ANGIOGRAPHY N/A 01/22/2021   Procedure: RIGHT/LEFT HEART CATH AND CORONARY ANGIOGRAPHY;  Surgeon: Yates DecampGanji, Jay, MD;  Location: [facility] INV[facility]IVE [facility];  Service: Cardiovascular;  Laterality: N/A;  . TUBAL LIGATION     Allergies  Allergen Reactions  . Metformin And Related Anaphylaxis    Could not swallow  . Nicotine Rash    Only allergic to nicotine patch  . Ace Inhibitors Cough    Cough with lisinopril.    Prior to Admission medications   Medication Sig Start Date End Date Taking? Authorizing Provider  ACCU-CHEK AVIVA PLUS test strip daily. for testing 03/11/18  Yes [provider]  albuterol (VENTOLIN HFA) 108 (90 Base) MCG/ACT inhaler Inhale 1 puff into the lungs every 6 (six) hours as needed for wheezing or shortness of breath.   Yes [provider]  ALPRAZolam Prudy Feeler(XANAX) 1 MG tablet Take 0.5 mg by mouth 3 (three) times daily as needed for anxiety or sleep.  08/21/13  Yes [provider]  aspirin 81 MG tablet Take 81 mg by mouth daily.   Yes [provider]  BLACK ELDERBERRY PO Take 10 mLs by mouth daily.   Yes [provider]  Continuous Blood Gluc Receiver (DEXCOM G6 RECEIVER) DEVI 1 Device by Does not apply route  every morning. Device and supplies 04/18/20  Yes Shade FloodGreene, Riggins Cisek R, MD  dapagliflozin propanediol (FARXIGA) 10 MG TABS tablet Take 1 tablet (10 mg total) by mouth daily. 01/25/21  Yes Regalado, Belkys A, MD  DULoxetine (CYMBALTA) 60 MG capsule Take 2 capsules (120 mg total) by mouth daily. 12/06/20  Yes Shade FloodGreene, Velena Keegan R, MD  ezetimibe (ZETIA) 10 MG tablet Take 1 tablet (10 mg total) by mouth daily. 01/25/21  Yes Regalado, Belkys A, MD  famotidine (PEPCID) 20 MG tablet Take 20 mg by mouth daily as needed for heartburn or indigestion.   Yes [provider]  insulin glargine (LANTUS SOLOSTAR) 100 UNIT/ML Solostar Pen Inject 38 Units into the skin daily. 12/31/20  Yes Shade FloodGreene, Christina Waldrop R, MD  Insulin Pen Needle 31G X 5 MM MISC Inject 1 each into the skin daily. 02/10/21  Yes Shade FloodGreene, Dareen Gutzwiller R, MD  levothyroxine (SYNTHROID) 88 MCG tablet TAKE  1 TABLET (88 MCG TOTAL) BY MOUTH DAILY BEFORE BREAKFAST. 01/19/21  Yes Shade Flood, MD  magnesium oxide (MAG-OX) 400 (241.3 Mg) MG tablet Take 0.5 tablets (200 mg total) by mouth 2 (two) times daily. 02/06/21  Yes Tolia, Sunit, DO  metoprolol succinate (TOPROL-XL) 100 MG 24 hr tablet Take 1 tablet (100 mg total) by mouth daily. Take with or immediately following a meal. 01/25/21  Yes Regalado, Belkys A, MD  naproxen sodium (ALEVE) 220 MG tablet Take 220 mg by mouth daily as needed.   Yes [provider]  nitroGLYCERIN (NITROSTAT) 0.4 MG SL tablet PLACE 1 TABLET UNDER THE TONGUE EVERY 5 MINUTES AS NEEDED FOR CHEST PAIN. IF YOU REQUIRE MORE THAN TWO TABLETS FIVE MINUTES APART GO TO THE NEAREST ER VIA EMS. Patient taking differently: Place 0.4 mg under the tongue every 5 (five) minutes as needed for chest pain. 07/29/20  Yes Tolia, Sunit, DO  ondansetron (ZOFRAN ODT) 4 MG disintegrating tablet Take 1 tablet (4 mg total) by mouth every 8 (eight) hours as needed for nausea or vomiting. 07/14/19  Yes Stallings, Zoe A, MD  pravastatin (PRAVACHOL) 80 MG tablet Take 1  tablet (80 mg total) by mouth every evening. 12/06/20 03/06/21 Yes Shade Flood, MD  sacubitril-valsartan (ENTRESTO) 97-103 MG Take 1 tablet by mouth 2 (two) times daily. 01/24/21  Yes Regalado, Belkys A, MD  spironolactone (ALDACTONE) 25 MG tablet Take 0.5 tablets (12.5 mg total) by mouth daily. Patient taking differently: Take 25 mg by mouth daily. 01/29/21  Yes Angelita Ingles, MD  Vitamin D, Ergocalciferol, (DRISDOL) 1.25 MG (50000 UNIT) CAPS capsule TAKE 1 CAPSULE (50,000 UNITS TOTAL) BY MOUTH EVERY 7 (SEVEN) DAYS. 01/12/21  Yes Shade Flood, MD   Social History   Socioeconomic History  . Marital status: Married    Spouse name: Not on file  . Number of children: 2  . Years of education: Not on file  . Highest education level: Not on file  Occupational History  . Not on file  Tobacco Use  . Smoking status: Current Every Day Smoker    Packs/day: 0.25    Years: 38.00    Pack years: 9.50    Types: Cigarettes  . Smokeless tobacco: Never Used  . Tobacco comment: not ready to quit but has decreased from 2ppd to 0.75ppd   Vaping Use  . Vaping Use: Former  Substance and Sexual Activity  . Alcohol use: No    Alcohol/week: 0.0 standard drinks  . Drug use: No  . Sexual activity: Not Currently  Other Topics Concern  . 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 Difficulty of Paying Living Expenses: Not very hard  Food Insecurity: No Food Insecurity  . Worried About Programme researcher, broadcasting/film/video in the Last Year: Never true  . Ran Out of Food in the Last Year: Never true  Transportation Needs: No Transportation Needs  . Lack of Transportation (Medical): No  . Lack of Transportation (Non-Medical): No  Physical Activity: Not  on file  Stress: Not on file  Social Connections: Not on file  Intimate Partner Violence: Not on file    Review of Systems Per HPI.   Objective:   Vitals:   02/14/21 1124 02/14/21 1140  BP: (!) 144/83 136/78  Pulse: 84   Temp: 98 F (36.7 C)   TempSrc: Temporal   SpO2: 100%   Weight: 166 lb (75.3 kg)   Height: 5' (1.524 m)      Physical Exam Vitals reviewed.  Constitutional:      Appearance: She is well-developed.  HENT:     Head: Normocephalic and atraumatic.  Eyes:     Conjunctiva/sclera: Conjunctivae normal.     Pupils: Pupils are equal, round, and reactive to light.  Neck:     Vascular: No carotid bruit.  Cardiovascular:     Rate and Rhythm: Normal rate and regular rhythm.     Heart sounds: Normal heart sounds.  Pulmonary:     Effort: Pulmonary effort is normal.     Breath sounds: Normal breath sounds.  Abdominal:     Palpations: Abdomen is soft. There is no pulsatile mass.     Tenderness: There is no abdominal tenderness.  Musculoskeletal:     Right lower leg: No edema.     Left lower leg: No edema.  Skin:    General: Skin is warm and dry.  Neurological:     General: No focal deficit present.     Mental Status: She is alert and oriented to person, place, and time.  Psychiatric:        Mood and Affect: Mood normal.        Behavior: Behavior normal.        Thought Content: Thought content normal.     41 minutes spent during visit, greater than 50% counseling and assimilation of information, chart review, and discussion of plan.    Assessment & Plan:  Felicia Hanson is a 65 y.o. female . Acute on chronic combined systolic and diastolic heart failure (HCC)  -Stable since hospitalization, appears euvolemic.  Continue routine follow-up with cardiology.  Plan for repeat lab work next few days to monitor for stability.  Respiratory distress Cough - Plan: DG Chest 2 View  -Improved as above.  Recent slight irritant cough, mild cough possible viral URI.   Lungs are clear.  Check chest x-ray within the next few days with ER/RTC precautions discussed.  Leukocytosis, unspecified type - Plan: CBC  -Afebrile, no apparent acute infection, chest x-ray as above.  Repeat CBC with RTC/ER precautions if fever or new infectious symptoms.  Uncontrolled type 2 diabetes mellitus with hyperglycemia (HCC) - Plan: Basic metabolic panel  -Monitor home readings with option of increasing insulin by 2 units.  Continue Marcelline Deist, now off Trulicity.  Generalized anxiety disorder   -Followed by psychiatry, handout given on managing anxiety as suspect component of adjustment disorder with recent hospitalization and stressors.  Therapist/counseling also recommended.  If med changes needed, recommend discussing with her psychiatrist.  No orders of the defined types were placed in this encounter.  Patient Instructions   Keep follow up as planned with cardiology. Be seen if new shortness of breath, swelling or any chest pains.   Chest xray at [facility] - [address] If cough worsens or new  shortness of breath - be seen right away.   Monitor blood sugars - if remaining over 200, then increase insulin by 2 units. Stay on same dose if in low to mid 100's.   Please contact psychiatrist if continued difficulty with anxiety,  but see info below as well. I do recommend meeting with therapist.     Please have bloodwork on Monday:  Here are a few lab drawing stations for you to have your labwork performed:  [facility] [address], [address] [place], [place] [address]  [facility] [address] [place], [place] [address]   Return to the clinic or go to the nearest emergency room if any of your symptoms worsen or new symptoms occur.   Managing Anxiety, Adult After being diagnosed with an anxiety disorder, you may be relieved to know why you have felt or behaved a certain way. You may also feel overwhelmed about the treatment ahead and what it will mean for  your life. With care and support, you can manage this condition and recover from it. How to manage lifestyle changes Managing stress and anxiety Stress is your body's reaction to life changes and events, both good and bad. Most stress will last just a few hours, but stress can be ongoing and can lead to more than just stress. Although stress can play a major role in anxiety, it is not the same as anxiety. Stress is usually caused by something external, such as a deadline, test, or competition. Stress normally passes after the triggering event has ended.  Anxiety is caused by something internal, such as imagining a terrible outcome or worrying that something will go wrong that will devastate you. Anxiety often does not go away even after the triggering event is over, and it can become long-term (chronic) worry. It is important to understand the differences between stress and anxiety and to manage your stress effectively so that it does not lead to an anxious response. Talk with your health care provider or a counselor to learn more about reducing anxiety and stress. He or she may suggest tension reduction techniques, such as:  Music therapy. This can include creating or listening to music that you enjoy and that inspires you.  Mindfulness-based meditation. This involves being aware of your normal breaths while not trying to control your breathing. It can be done while sitting or walking.  Centering prayer. This involves focusing on a word, phrase, or sacred image that means something to you and brings you peace.  Deep breathing. To do this, expand your stomach and inhale slowly through your nose. Hold your breath for 3-5 seconds. Then exhale slowly, letting your stomach muscles relax.  Self-talk. This involves identifying thought patterns that lead to anxiety reactions and changing those patterns.  Muscle relaxation. This involves tensing muscles and then relaxing them. Choose a tension reduction  technique that suits your lifestyle and personality. These techniques take time and practice. Set aside 5-15 minutes a day to do them. Therapists can offer counseling and training in these techniques. The training to help with anxiety may be covered by some insurance plans. Other things you can do to manage stress and anxiety include:  Keeping a stress/anxiety diary. This can help you learn what triggers your reaction and then learn ways to manage your response.  Thinking about how you react to certain situations. You may not be able to control everything, but you can control your response.  Making time for activities that help you relax and not feeling guilty about spending your time in this way.  Visual imagery and yoga can help you stay calm and relax.   Medicines Medicines can help ease symptoms. Medicines for anxiety include:  Anti-anxiety drugs.  Antidepressants. Medicines are often used as a  primary treatment for anxiety disorder. Medicines will be prescribed by a health care provider. When used together, medicines, psychotherapy, and tension reduction techniques may be the most effective treatment. Relationships Relationships can play a big part in helping you recover. Try to spend more time connecting with trusted friends and family members. Consider going to couples counseling, taking family education classes, or going to family therapy. Therapy can help you and others better understand your condition. How to recognize changes in your anxiety Everyone responds differently to treatment for anxiety. Recovery from anxiety happens when symptoms decrease and stop interfering with your daily activities at home or work. This may mean that you will start to:  Have better concentration and focus. Worry will interfere less in your daily thinking.  Sleep better.  Be less irritable.  Have more energy.  Have improved memory. It is important to recognize when your condition is getting  worse. Contact your health care provider if your symptoms interfere with home or work and you feel like your condition is not improving. Follow these instructions at home: Activity  Exercise. Most adults should do the following: ? Exercise for at least 150 minutes each week. The exercise should increase your heart rate and make you sweat (moderate-intensity exercise). ? Strengthening exercises at least twice a week.  Get the right amount and quality of sleep. Most adults need 7-9 hours of sleep each night. Lifestyle  Eat a healthy diet that includes plenty of vegetables, fruits, whole grains, low-fat dairy products, and lean protein. Do not eat a lot of foods that are high in solid fats, added sugars, or salt.  Make choices that simplify your life.  Do not use any products that contain nicotine or tobacco, such as cigarettes, e-cigarettes, and chewing tobacco. If you need help quitting, ask your health care provider.  Avoid caffeine, alcohol, and certain over-the-counter cold medicines. These may make you feel worse. Ask your pharmacist which medicines to avoid.   General instructions  Take over-the-counter and prescription medicines only as told by your health care provider.  Keep all follow-up visits as told by your health care provider. This is important. Where to find support You can get help and support from these sources:  Self-help groups.  Online and [employer].  A trusted spiritual leader.  Couples counseling.  Family education classes.  Family therapy. Where to find more information You may find that joining a support group helps you deal with your anxiety. The following sources can help you locate counselors or support groups near you:  Mental Health America: www.mentalhealthamerica.net  Anxiety and Depression Association of [place] (ADAA): ProgramCam.de  [employer] on Mental Illness (NAMI): www.nami.org Contact a health care provider if  you:  Have a hard time staying focused or finishing daily tasks.  Spend many hours a day feeling worried about everyday life.  Become exhausted by worry.  Start to have headaches, feel tense, or have nausea.  Urinate more than normal.  Have diarrhea. Get help right away if you have:  A racing heart and shortness of breath.  Thoughts of hurting yourself or others. If you ever feel like you may hurt yourself or others, or have thoughts about taking your own life, get help right away. You can go to your nearest emergency department or call:  Your local emergency services (911 in the [place]  A suicide crisis helpline, such as the National Suicide Prevention Lifeline at (608) 533-2508. This is open 24 hours a day. Summary  Taking steps  to learn and use tension reduction techniques can help calm you and help prevent triggering an anxiety reaction.  When used together, medicines, psychotherapy, and tension reduction techniques may be the most effective treatment.  Family, friends, and partners can play a big part in helping you recover from an anxiety disorder. This information is not intended to replace advice given to you by your health care provider. Make sure you discuss any questions you have with your health care provider. Document Revised: 04/18/2019 Document Reviewed: 04/18/2019 Elsevier Patient Education  2021 [employer].   If you have lab work done today you will be contacted with your lab results within the next 2 weeks.  If you have not heard from [place] then please contact us. The fastest way to get your results is to register for My Chart.   IF you received an x-ray today, you will receive an invoice from [facility]. Please contact [facility] at [phone] with questions or concerns regarding your invoice.   IF you received labwork today, you will receive an invoice from [facility]. Please contact [facility] at [phone] with questions or concerns  regarding your invoice.   Our billing staff will not be able to assist you with questions regarding bills from these companies.  You will be contacted with the lab results as soon as they are available. The fastest way to get your results is to activate your My Chart account. Instructions are located on the last page of this paperwork. If you have not heard from [place] regarding the results in 2 weeks, please contact this office.         Signed, Meredith Staggers, MD [facility]

## 2021-02-15 ENCOUNTER — Encounter: Payer: Self-pay | Admitting: Family Medicine

## 2021-02-21 ENCOUNTER — Ambulatory Visit
Admission: RE | Admit: 2021-02-21 | Discharge: 2021-02-21 | Disposition: A | Discharge status: Home / Self Care | Payer: Medicare Other | Source: Ambulatory Visit | Attending: Family Medicine | Admitting: Family Medicine

## 2021-02-21 DIAGNOSIS — R059 Cough, unspecified: Secondary | ICD-10-CM

## 2021-02-22 LAB — BASIC METABOLIC PANEL
BUN/Creatinine Ratio: 10 — ABNORMAL LOW (ref 12–28)
BUN: 15 mg/dL (ref 8–27)
CO2: 21 mmol/L (ref 20–29)
Calcium: 9.9 mg/dL (ref 8.7–10.3)
Chloride: 91 mmol/L — ABNORMAL LOW (ref 96–106)
Creatinine, Ser: 1.54 mg/dL — ABNORMAL HIGH (ref 0.57–1.00)
Glucose: 498 mg/dL — ABNORMAL HIGH (ref 65–99)
Potassium: 5.9 mmol/L — ABNORMAL HIGH (ref 3.5–5.2)
Sodium: 137 mmol/L (ref 134–144)
eGFR: 37 mL/min/{1.73_m2} — ABNORMAL LOW (ref >59)

## 2021-02-22 LAB — CBC
Hematocrit: 47.5 % — ABNORMAL HIGH (ref 34.0–46.6)
Hemoglobin: 15.7 g/dL (ref 11.1–15.9)
MCH: 29.6 pg (ref 26.6–33.0)
MCHC: 33.1 g/dL (ref 31.5–35.7)
MCV: 90 fL (ref 79–97)
Platelets: 194 10*3/uL (ref 150–450)
RBC: 5.31 x10E6/uL — ABNORMAL HIGH (ref 3.77–5.28)
RDW: 12.2 % (ref 11.7–15.4)
WBC: 11.3 10*3/uL — ABNORMAL HIGH (ref 3.4–10.8)

## 2021-02-24 ENCOUNTER — Other Ambulatory Visit: Payer: Self-pay | Admitting: Family Medicine

## 2021-02-24 ENCOUNTER — Other Ambulatory Visit: Payer: Self-pay | Admitting: Cardiology

## 2021-02-24 DIAGNOSIS — I5042 Chronic combined systolic (congestive) and diastolic (congestive) heart failure: Secondary | ICD-10-CM

## 2021-02-24 DIAGNOSIS — R7989 Other specified abnormal findings of blood chemistry: Secondary | ICD-10-CM

## 2021-02-25 ENCOUNTER — Other Ambulatory Visit: Payer: Self-pay | Admitting: Pharmacist

## 2021-02-25 DIAGNOSIS — I5022 Chronic systolic (congestive) heart failure: Secondary | ICD-10-CM

## 2021-02-25 MED ORDER — METOPROLOL SUCCINATE ER 100 MG PO TB24
100.0000 mg | ORAL_TABLET | Freq: Every day | ORAL | 2 refills | Status: DC
Start: 2021-02-25 — End: 2021-12-29

## 2021-02-25 NOTE — Addendum Note (Signed)
Addended by: Eldred Manges on: 02/25/2021 11:26 AM   Modules accepted: Orders

## 2021-03-06 ENCOUNTER — Ambulatory Visit: Payer: Self-pay | Admitting: Family Medicine

## 2021-03-07 ENCOUNTER — Other Ambulatory Visit: Payer: Self-pay

## 2021-03-07 ENCOUNTER — Other Ambulatory Visit: Payer: Self-pay | Admitting: Cardiology

## 2021-03-07 ENCOUNTER — Other Ambulatory Visit: Payer: Self-pay | Admitting: Family Medicine

## 2021-03-07 DIAGNOSIS — I5042 Chronic combined systolic (congestive) and diastolic (congestive) heart failure: Secondary | ICD-10-CM

## 2021-03-07 MED ORDER — SACUBITRIL-VALSARTAN 97-103 MG PO TABS: 1.0000 | ORAL_TABLET | Freq: Two times a day (BID) | ORAL | 3 refills | Status: DC

## 2021-03-07 NOTE — Telephone Encounter (Signed)
Ok to refill for a 30 day supply? Please advise.

## 2021-03-13 NOTE — Progress Notes (Deleted)
Date:  03/13/2021   ID:  Felicia Hanson, DOB 01/03/1956, MRN [id number]  PCP:  Shade Flood, MD  Cardiologist:  Tessa Lerner, DO, [facility] (established care 04/22/2020)  ***  No chief complaint on file.   HPI   Felicia Hanson is a 65 y.o. female who presents to the office with a  chief complaint of " ***."  Her past medical history and cardiovascular risk factors are: insulin-dependent type 2 diabetes with hyperglycemia, pure hypercholesterolemia, hypertriglyceridemia, coronary artery calcification, nonischemic cardiomyopathy, left bundle branch block, essential hypertension, hypothyroidism, tobacco use disorder, vitamin D deficiency, generalized anxiety disorder.  Since last office visit patient was hospitalized in February 2022 with a chief complaint of shortness of breath.  Patient was found to be in respiratory distress associated with hypoxia requiring BiPAP intervention.  She was diuresed more than negative net 5 L with IV diuresis.  Echocardiogram noted reduced LVEF with diastolic dysfunction.  Given her symptoms, acute heart failure exacerbation, prior ischemic evaluation the shared decision was to proceed with left and right heart catheterization.  Patient tolerated the procedure well without any immediate complications.  Her medications were uptitrated to GDMT and was later discharged home with close follow-up.  This was supposed to be a transition of care follow-up visit but it appears that the patient has already seen providers at [facility] since discharge.  Since discharge patient states that she does not have any chest pain or anginal equivalent.  Her shortness of breath has significantly improved but still present with effort related activities.  Has not required sublingual nitroglycerin tablets.  Patient would like me to fill out prior authorization forms for [place].  Patient states that she continues to smoke 1 cigarette/day.  No family history of premature  coronary disease or sudden cardiac death.  FUNCTIONAL STATUS: No structured exercise program or daily routine.  But does actively take care of her grandkids.  ALLERGIES: Allergies  Allergen Reactions  . Metformin And Related Anaphylaxis    Could not swallow  . Nicotine Rash    Only allergic to nicotine patch  . Ace Inhibitors Cough    Cough with lisinopril.     MEDICATION LIST PRIOR TO VISIT: No outpatient medications have been marked as taking for the 03/17/21 encounter (Appointment) with Odis Hollingshead, [place], DO.     PAST MEDICAL HISTORY: Past Medical History:  Diagnosis Date  . Anxiety   . Coronary artery calcification   . Depression   . Diabetes mellitus without complication (HCC)   . Hyperlipidemia   . Hypertension   . Thyroid disease     PAST SURGICAL HISTORY: Past Surgical History:  Procedure Laterality Date  . BREAST EXCISIONAL BIOPSY Left   . CHOLECYSTECTOMY    . COLON SURGERY     diverticulitis with perforation; s/p colon resection.  . Colonoscopy    . RIGHT/LEFT HEART CATH AND CORONARY ANGIOGRAPHY N/A 01/22/2021   Procedure: RIGHT/LEFT HEART CATH AND CORONARY ANGIOGRAPHY;  Surgeon: Yates Decamp, MD;  Location: [facility] INVASIVE CV LAB;  Service: Cardiovascular;  Laterality: N/A;  . TUBAL LIGATION      FAMILY HISTORY: The patient family history includes Breast cancer in her paternal aunt; Cancer in her mother; Hyperlipidemia in her mother.  SOCIAL HISTORY:  The patient  reports that she has been smoking cigarettes. She has a 9.50 pack-year smoking history. She has never used smokeless tobacco. She reports that she does not drink alcohol and does not use drugs.  REVIEW OF SYSTEMS:  Review of Systems  Constitutional: Negative for chills and fever.  HENT: Negative for hoarse voice and nosebleeds.   Eyes: Negative for discharge, double vision and pain.  Cardiovascular: Positive for dyspnea on exertion. Negative for chest pain, claudication, leg swelling, near-syncope,  orthopnea, palpitations, paroxysmal nocturnal dyspnea and syncope.  Respiratory: Negative for hemoptysis and shortness of breath.   Musculoskeletal: Negative for muscle cramps and myalgias.  Gastrointestinal: Negative for abdominal pain, constipation, diarrhea, hematemesis, hematochezia, melena, nausea and vomiting.  Neurological: Negative for dizziness and light-headedness.    PHYSICAL EXAM: Vitals with BMI 02/14/2021 02/14/2021 02/06/2021  Height - 5\' 0"  5\' 0"   Weight - 166 lbs 168 lbs 3 oz  BMI - 32.42 32.85  Systolic 136 144  Diastolic 78 83 80  Pulse - 84 85    CONSTITUTIONAL: Well-developed and well-nourished. No acute distress.  SKIN: Skin is warm and dry. No rash noted. No cyanosis. No pallor. No jaundice HEAD: Normocephalic and atraumatic.  EYES: No scleral icterus MOUTH/THROAT: Moist oral membranes.  NECK: No JVD present. No thyromegaly noted. No carotid bruits  LYMPHATIC: No visible cervical adenopathy.  CHEST Normal respiratory effort. No intercostal retractions  LUNGS: Clear to auscultation bilaterally.  No stridor. No wheezes. No rales.  CARDIOVASCULAR: Regular rate and rhythm, positive S1-S2, no murmurs rubs or gallops appreciated. ABDOMINAL: Obese, soft, nontender, nondistended, positive bowel sounds in all 4 quadrants.  No apparent ascites.  EXTREMITIES: No peripheral edema  HEMATOLOGIC: No significant bruising NEUROLOGIC: Oriented to person, place, and time. Nonfocal. Normal muscle tone.  PSYCHIATRIC: Normal mood and affect. Normal behavior. Cooperative  CARDIAC DATABASE:  EKG:  02/09/2021: Normal sinus rhythm, 84 bpm, left axis deviation, left bundle branch block, left atrial enlargement, poor R wave progression, without underlying injury pattern.    Coronary calcium scoring 05/13/2015: LAD: 8, Cx: 2. Total calcium score is 10.   Echocardiogram: 01/20/2021:  1. Left ventricular ejection fraction, by estimation, is 35 to 40%. The left ventricle has  moderately decreased function. There is mild left ventricular hypertrophy. Indeterminate diastolic filling due to E-A fusion.  2. Right ventricular systolic function is normal. The right ventricular size is normal.  3. The mitral valve is abnormal. Trivial mitral valve regurgitation.  4. The aortic valve is abnormal. Aortic valve regurgitation is not visualized. Mild to moderate aortic valve sclerosis/calcification is present, without any evidence of aortic stenosis.   Stress Testing: Lexiscan Tetrofosmin Stress Test 05/13/2020:  There is a fixed mild defect in the inferior region due to diaphragmatic attenuation. No ischemia or scar.  The LV is moderately dilated with LV end diastolic volume of 173 mL.  Overall LV systolic function is abnormal without regional wall motion abnormalities. Stress LV EF: 21%.   High risk study due to low LVEF. Findings suggest non ischemic dilated cardiomyopathy.  Heart Catheterization: Right + left heart catheterization 01/22/2021: RA 16/12, mean 11 mmHg, RA saturation 80%. RV 47/8, EDP 15 mmHg. PA 41/26, mean 33 mmHg. PA saturation 77%. PW 32/26, mean 29 mmHg. Aortic saturation 98%. Cardiac output 6.58, cardiac index 3.80 by Fick. Stroke-volume 70 mL. SVR 16.87, PVR 1.05. QP/QS 0.86. LV: Global hypokinesis, EF 35%. EDP markedly elevated at 32 mmHg. There was no pressure gradient across the aortic valve. Left main: Mild disease. LAD: Mild diffuse disease. CX: Mild diffuse disease. RI: Mild diffuse disease. RCA: Dominant, mild diffuse disease.  Impression: Nonischemic cardiomyopathy with severe LV systolic dysfunction. Mild to moderate pulmonary hypertension secondary to diastolic dysfunction with LV EDP elevation.  LABORATORY DATA: CBC Latest Ref Rng & Units 02/21/2021 01/24/2021 01/23/2021  WBC 3.4 - 10.8 x10E3/uL 11.3(H) 15.3(H) 13.9(H)  Hemoglobin 11.1 - 15.9 g/dL 16.115.7 15.7(H) 15.2(H)  Hematocrit 34.0 - 46.6 % 47.5(H) 44.9 44.3  Platelets  150 - 450 x10E3/uL 194 221 201    CMP Latest Ref Rng & Units 02/21/2021 02/12/2021 01/28/2021  Glucose 65 - 99 mg/dL 096(E498(H) 454(UJ553(HH) 811(B105(H)  BUN 8 - 27 mg/dL 15 12 12   Creatinine 0.57 - 1.00 mg/dL 1.47(W1.54(H) 2.95(A1.22(H) 2.13(Y1.40(H)  Sodium 134 - 144 mmol/L 137 131(L) 139  Potassium 3.5 - 5.2 mmol/L 5.9(H) 4.6 4.9  Chloride 96 - 106 mmol/L 91(L) 89(L) 97(L)  CO2 20 - 29 mmol/L 21 19(L) 31  Calcium 8.7 - 10.3 mg/dL 9.9 9.5 9.7  Total Protein 6.5 - 8.1 g/dL - - -  Total Bilirubin 0.3 - 1.2 mg/dL - - -  Alkaline Phos 38 - 126 U/L - - -  AST 15 - 41 U/L - - -  ALT 0 - 44 U/L - - -    Lipid Panel     Component Value Date/Time   CHOL 153 12/06/2020 1638   TRIG 158 (H) 12/06/2020 1638   HDL 46 12/06/2020 1638   CHOLHDL 3.3 12/06/2020 1638   CHOLHDL 5.0 08/09/2015 1504   VLDL 44 (H) 08/09/2015 1504   LDLCALC 80 12/06/2020 1638   LABVLDL 27 12/06/2020 1638   Lipid Panel Recent Labs    04/03/20 1757 12/06/20 1638  CHOL 189 153  TRIG 248* 158*  LDLCALC 98 80  HDL 49 46  CHOLHDL 3.9 3.3    Lab Results  Component Value Date   HGBA1C 6.8 (H) 12/06/2020   HGBA1C 9.1 (A) 09/06/2020   HGBA1C 11.5 (A) 07/08/2020   No components found for: NTPROBNP Lab Results  Component Value Date   TSH 0.294 (L) 01/20/2021   TSH 1.160 09/06/2020   TSH 0.732 04/03/2020    IMPRESSION:  No diagnosis found.   RECOMMENDATIONS: Felicia MedinDiane G Hanson is a 65 y.o. female whose past medical history and cardiac risk factors include: insulin-dependent type 2 diabetes with hyperglycemia, pure hypercholesterolemia, hypertriglyceridemia, coronary artery calcification, nonischemic cardiomyopathy, left bundle branch block, essential hypertension, hypothyroidism, tobacco use disorder, vitamin D deficiency, generalized anxiety disorder.  Chronic combined systolic and diastolic heart failure, stage C, NYHA class II: Last hospitalization for congestive heart failure February 2022. Asymptomatic currently. Medications  reconciled. Prior authorizations for [employer] and [employer] completed. Patient will be enrolled into principal care management for congestive heart failure. Refer to Dr. Vickey Hugerohmeier for sleep study. Given the recent up titration of guideline directed medical therapy we will check a BMP, proBNP, and magnesium levels. Plan to order a follow-up echo in 90 days to reevaluate LVEF. We will consider CRT therapy in the setting of reduced LVEF, left bundle branch block, and a QRS duration greater than 120 ms.  Nonischemic cardiomyopathy: See plan above  Calcified atherosclerotic coronary artery disease: Continue current medical therapy.  Educated on the importance of risk factor modifications.  Educated on the importance of complete smoking cessation.  We will continue to follow.  Left bundle branch block: Monitor for now.   Non-insulin-dependent diabetes mellitus type 2: Most recent hemoglobin A1c reviewed.  Currently managed per primary team.  Benign essential hypertension: Currently managed by primary provider. Educated on importance of low-salt diet.  Active tobacco use: Since discharge from the hospital she continues to smoke no more than 1 cigarette/day.  Patient states that she plans  to completely wean off in the next couple weeks to months but currently needs to smoke 1 cigarette to calm her nerves.  I have asked her to discuss this further with PCP.   Total encounter time 45 minutes.  Time spent reviewing her hospitalization records, echocardiogram report, heart catheterization report, discharge summary, performing medication reconciliation, prior office authorizations for Sherryll Burger and Marcelline Deist filled out, discussing plan of care and coordination of care as well.  FINAL MEDICATION LIST END OF ENCOUNTER: No orders of the defined types were placed in this encounter.   There are no discontinued medications.   Current Outpatient Medications:  .  ACCU-CHEK AVIVA PLUS test strip, daily. for testing,  Disp: , Rfl: 2 .  albuterol (VENTOLIN HFA) 108 (90 Base) MCG/ACT inhaler, Inhale 1 puff into the lungs every 6 (six) hours as needed for wheezing or shortness of breath., Disp: , Rfl:  .  ALPRAZolam (XANAX) 1 MG tablet, Take 0.5 mg by mouth 3 (three) times daily as needed for anxiety or sleep. , Disp: , Rfl:  .  aspirin 81 MG tablet, Take 81 mg by mouth daily., Disp: , Rfl:  .  BLACK ELDERBERRY PO, Take 10 mLs by mouth daily., Disp: , Rfl:  .  Continuous Blood Gluc Receiver (DEXCOM G6 RECEIVER) DEVI, 1 Device by Does not apply route every morning. Device and supplies, Disp: 1 each, Rfl: 0 .  dapagliflozin propanediol (FARXIGA) 10 MG TABS tablet, Take 1 tablet (10 mg total) by mouth daily., Disp: 30 tablet, Rfl: 3 .  DULoxetine (CYMBALTA) 60 MG capsule, Take 2 capsules (120 mg total) by mouth daily., Disp: 180 capsule, Rfl: 1 .  ezetimibe (ZETIA) 10 MG tablet, Take 1 tablet (10 mg total) by mouth daily., Disp: 30 tablet, Rfl: 2 .  famotidine (PEPCID) 20 MG tablet, Take 20 mg by mouth daily as needed for heartburn or indigestion., Disp: , Rfl:  .  insulin glargine (LANTUS SOLOSTAR) 100 UNIT/ML Solostar Pen, Inject 38 Units into the skin daily., Disp: 45 mL, Rfl: 1 .  Insulin Pen Needle 31G X 5 MM MISC, Inject 1 each into the skin daily., Disp: 100 each, Rfl: 2 .  levothyroxine (SYNTHROID) 88 MCG tablet, TAKE 1 TABLET (88 MCG TOTAL) BY MOUTH DAILY BEFORE BREAKFAST., Disp: 90 tablet, Rfl: 1 .  magnesium oxide (MAG-OX) 400 MG tablet, TAKE 0.5 TABLETS (200 MG TOTAL) BY MOUTH 2 (TWO) TIMES DAILY., Disp: 10 tablet, Rfl: 0 .  magnesium oxide (MAG-OX) 400 MG tablet, TAKE 1/2 TABLET (200 MG TOTAL) BY MOUTH TWO TIMES DAILY., Disp: 10 tablet, Rfl: 0 .  metoprolol succinate (TOPROL-XL) 100 MG 24 hr tablet, Take 1 tablet (100 mg total) by mouth daily. Take with or immediately following a meal., Disp: 90 tablet, Rfl: 2 .  naproxen sodium (ALEVE) 220 MG tablet, Take 220 mg by mouth daily as needed., Disp: , Rfl:  .   nitroGLYCERIN (NITROSTAT) 0.4 MG SL tablet, PLACE 1 TABLET UNDER THE TONGUE EVERY 5 MINUTES AS NEEDED FOR CHEST PAIN. IF YOU REQUIRE MORE THAN TWO TABLETS FIVE MINUTES APART GO TO THE NEAREST ER VIA EMS. (Patient taking differently: Place 0.4 mg under the tongue every 5 (five) minutes as needed for chest pain.), Disp: 25 tablet, Rfl: 1 .  ondansetron (ZOFRAN ODT) 4 MG disintegrating tablet, Take 1 tablet (4 mg total) by mouth every 8 (eight) hours as needed for nausea or vomiting., Disp: 20 tablet, Rfl: 1 .  potassium chloride SA (KLOR-CON) 20 MEQ tablet, TAKE 1  TABLET (20 MEQ TOTAL) BY MOUTH DAILY., Disp: 2 tablet, Rfl: 0 .  pravastatin (PRAVACHOL) 80 MG tablet, Take 1 tablet (80 mg total) by mouth every evening., Disp: 90 tablet, Rfl: 1 .  sacubitril-valsartan (ENTRESTO) 97-103 MG, Take 1 tablet by mouth 2 (two) times daily., Disp: 60 tablet, Rfl: 3 .  spironolactone (ALDACTONE) 25 MG tablet, Take 0.5 tablets (12.5 mg total) by mouth daily. (Patient taking differently: Take 25 mg by mouth daily.), Disp: 30 tablet, Rfl: 3 .  Vitamin D, Ergocalciferol, (DRISDOL) 1.25 MG (50000 UNIT) CAPS capsule, TAKE 1 CAPSULE (50,000 UNITS TOTAL) BY MOUTH EVERY 7 (SEVEN) DAYS., Disp: 15 capsule, Rfl: 0  No orders of the defined types were placed in this encounter.  --Continue cardiac medications as reconciled in final medication list. --No follow-ups on file. Or sooner if needed. --Continue follow-up with your primary care physician regarding the management of your other chronic comorbid conditions.  Patient's questions and concerns were addressed to her satisfaction. She voices understanding of the instructions provided during this encounter.   This note was created using a voice recognition software as a result there may be grammatical errors inadvertently enclosed that do not reflect the nature of this encounter. Every attempt is made to correct such errors.  Tessa Lerner, [place], [facility]  Pager:  [phone] Office: [phone]

## 2021-03-17 ENCOUNTER — Ambulatory Visit: Payer: Medicare Other | Admitting: Cardiology

## 2021-03-18 ENCOUNTER — Other Ambulatory Visit: Payer: Self-pay | Admitting: Cardiology

## 2021-03-18 DIAGNOSIS — I5042 Chronic combined systolic (congestive) and diastolic (congestive) heart failure: Secondary | ICD-10-CM

## 2021-03-24 ENCOUNTER — Ambulatory Visit: Payer: Medicare Other | Admitting: Neurology

## 2021-03-24 ENCOUNTER — Encounter: Payer: Self-pay | Admitting: Neurology

## 2021-03-24 VITALS — BP 142/81 | HR 81 | Ht 60.0 in | Wt 167.0 lb

## 2021-03-24 DIAGNOSIS — G4719 Other hypersomnia: Secondary | ICD-10-CM | POA: Insufficient documentation

## 2021-03-24 DIAGNOSIS — I5043 Acute on chronic combined systolic (congestive) and diastolic (congestive) heart failure: Secondary | ICD-10-CM | POA: Insufficient documentation

## 2021-03-24 DIAGNOSIS — F411 Generalized anxiety disorder: Secondary | ICD-10-CM | POA: Insufficient documentation

## 2021-03-24 DIAGNOSIS — R0602 Shortness of breath: Secondary | ICD-10-CM

## 2021-03-24 DIAGNOSIS — R0683 Snoring: Secondary | ICD-10-CM | POA: Diagnosis not present

## 2021-03-24 DIAGNOSIS — J431 Panlobular emphysema: Secondary | ICD-10-CM
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
## 2021-03-24 NOTE — Patient Instructions (Signed)
Heart Failure, Diagnosis  Heart failure is a condition in which the heart has trouble pumping blood. This may mean that the heart cannot pump enough blood out to the body or that the heart does not fill up with enough blood. For some people with heart failure, fluid may back up into the lungs. There may also be swelling (edema) in the lower legs. Heart failure is usually a long-term (chronic) condition. It is important for you to take good care of yourself and follow the treatment plan from your health care provider. What are the causes? This condition may be caused by:  High blood pressure (hypertension). Hypertension causes the heart muscle to work harder than normal.  Coronary artery disease, or CAD. CAD is the buildup of cholesterol and fat (plaque) in the arteries of the heart.  Heart attack, also called myocardial infarction. This injures the heart muscle, making it hard for the heart to pump blood.  Abnormal heart valves. The valves do not open and close properly, forcing the heart to pump harder to keep the blood flowing.  Heart muscle disease, inflammation, or infection (cardiomyopathy or myocarditis). This is damage to the heart muscle. It can increase the risk of heart failure.  Lung disease. The heart works harder when the lungs are not healthy. What increases the risk? The risk of heart failure increases as a person ages. This condition is also more likely to develop in people who:  Are obese.  Are female.  Use tobacco or nicotine products.  Abuse alcohol or drugs.  Have taken medicines that can damage the heart, such as chemotherapy drugs.  Have any of these conditions: ? Diabetes. ? Abnormal heart rhythms. ? Thyroid problems. ? Low blood counts (anemia). ? Chronic kidney disease.  Have a family history of heart failure. What are the signs or symptoms? Symptoms of this condition include:  Shortness of breath with activity, such as when climbing stairs.  A cough  that does not go away.  Swelling of the feet, ankles, legs, or abdomen.  Losing or gaining weight for no reason.  Trouble breathing when lying flat.  Waking from sleep because of the need to sit up and get more air.  Rapid heartbeat.  Tiredness (fatigue) and loss of energy.  Feeling light-headed, dizzy, or close to fainting.  Nausea or loss of appetite.  Waking up more often during the night to urinate (nocturia).  Confusion. How is this diagnosed? This condition is diagnosed based on:  Your medical history, symptoms, and a physical exam.  Diagnostic tests, which may include: ? Echocardiogram. ? Electrocardiogram (ECG). ? Chest X-ray. ? Blood tests. ? Exercise stress test. ? Cardiac MRI. ? Cardiac catheterization and angiogram. ? Radionuclide scans. How is this treated? Treatment for this condition is aimed at managing the symptoms of heart failure. Medicines Treatment may include medicines that:  Help lower blood pressure by relaxing (dilating) the blood vessels. These medicines are called ACE inhibitors (angiotensin-converting enzyme), ARBs (angiotensin receptor blockers), or vasodilators.  Cause the kidneys to remove salt and water from the blood through urination (diuretics).  Improve heart muscle strength and prevent the heart from beating too fast (beta blockers).  Increase the force of the heartbeat (digoxin).  Lower heart rates. Certain diabetes medicines (SGLT-2 inhibitors) may also be used in treatment. Healthy behavior changes Treatment may also include making healthy lifestyle changes, such as:  Reaching and staying at a healthy weight.  Not using tobacco or nicotine products.  Eating heart-healthy foods.    Limiting or avoiding alcohol.  Stopping the use of illegal drugs.  Being physically active.  Participating in a cardiac rehabilitation program, which is a treatment program to improve your health and well-being through exercise training,  education, and counseling. Other treatments Other treatments may include:  Procedures to open blocked arteries or repair damaged valves.  Placing a pacemaker to improve heart function (cardiac resynchronization therapy).  Placing a device to treat serious abnormal heart rhythms (implantable cardioverter defibrillator, or ICD).  Placing a device to improve the pumping ability of the heart (left ventricular assist device, or LVAD).  Receiving a healthy heart from a donor (heart transplant). This is done when other treatments have not helped. Follow these instructions at home:  Manage other health conditions as told by your health care provider. These may include hypertension, diabetes, thyroid disease, or abnormal heart rhythms.  Get ongoing education and support as needed. Learn as much as you can about heart failure.  Keep all follow-up visits. This is important. Summary  Heart failure is a condition in which the heart has trouble pumping blood.  This condition is commonly caused by high blood pressure and other diseases of the heart and lungs.  Symptoms of this condition include shortness of breath, tiredness (fatigue), nausea, and swelling of the feet, ankles, legs, or abdomen.  Treatments for this condition may include medicines, lifestyle changes, and surgery.  Manage other health conditions as told by your health care provider. This information is not intended to replace advice given to you by your health care provider. Make sure you discuss any questions you have with your health care provider. Document Revised: 06/08/2020 Document Reviewed: 06/08/2020 Elsevier Patient Education  2021 [employer] Insomnia Insomnia is a sleep disorder that makes it difficult to fall asleep or stay asleep. Insomnia can cause fatigue, low energy, difficulty concentrating, mood swings, and poor performance at work or school. There are three different ways to classify insomnia:  Difficulty  falling asleep.  Difficulty staying asleep.  Waking up too early in the morning. Any type of insomnia can be long-term (chronic) or short-term (acute). Both are common. Short-term insomnia usually lasts for three months or less. Chronic insomnia occurs at least three times a week for longer than three months. What are the causes? Insomnia may be caused by another condition, situation, or substance, such as:  Anxiety.  Certain medicines.  Gastroesophageal reflux disease (GERD) or other gastrointestinal conditions.  Asthma or other breathing conditions.  Restless legs syndrome, sleep apnea, or other sleep disorders.  Chronic pain.  Menopause.  Stroke.  Abuse of alcohol, tobacco, or illegal drugs.  Mental health conditions, such as depression.  Caffeine.  Neurological disorders, such as Alzheimer's disease.  An overactive thyroid (hyperthyroidism). Sometimes, the cause of insomnia may not be known. What increases the risk? Risk factors for insomnia include:  Gender. Women are affected more often than men.  Age. Insomnia is more common as you get older.  Stress.  Lack of exercise.  Irregular work schedule or working night shifts.  Traveling between different time zones.  Certain medical and mental health conditions. What are the signs or symptoms? If you have insomnia, the main symptom is having trouble falling asleep or having trouble staying asleep. This may lead to other symptoms, such as:  Feeling fatigued or having low energy.  Feeling nervous about going to sleep.  Not feeling rested in the morning.  Having trouble concentrating.  Feeling irritable, anxious, or depressed. How is this diagnosed? This  condition may be diagnosed based on:  Your symptoms and medical history. Your health care provider may ask about: ? Your sleep habits. ? Any medical conditions you have. ? Your mental health.  A physical exam. How is this treated? Treatment for  insomnia depends on the cause. Treatment may focus on treating an underlying condition that is causing insomnia. Treatment may also include:  Medicines to help you sleep.  Counseling or therapy.  Lifestyle adjustments to help you sleep better. Follow these instructions at home: Eating and drinking  Limit or avoid alcohol, caffeinated beverages, and cigarettes, especially close to bedtime. These can disrupt your sleep.  Do not eat a large meal or eat spicy foods right before bedtime. This can lead to digestive discomfort that can make it hard for you to sleep.   Sleep habits  Keep a sleep diary to help you and your health care provider figure out what could be causing your insomnia. Write down: ? When you sleep. ? When you wake up during the night. ? How well you sleep. ? How rested you feel the next day. ? Any side effects of medicines you are taking. ? What you eat and drink.  Make your bedroom a dark, comfortable place where it is easy to fall asleep. ? Put up shades or blackout curtains to block light from outside. ? Use a white noise machine to block noise. ? Keep the temperature cool.  Limit screen use before bedtime. This includes: ? Watching TV. ? Using your smartphone, tablet, or computer.  Stick to a routine that includes going to bed and waking up at the same times every day and night. This can help you fall asleep faster. Consider making a quiet activity, such as reading, part of your nighttime routine.  Try to avoid taking naps during the day so that you sleep better at night.  Get out of bed if you are still awake after 15 minutes of trying to sleep. Keep the lights down, but try reading or doing a quiet activity. When you feel sleepy, go back to bed.   General instructions  Take over-the-counter and prescription medicines only as told by your health care provider.  Exercise regularly, as told by your health care provider. Avoid exercise starting several hours  before bedtime.  Use relaxation techniques to manage stress. Ask your health care provider to suggest some techniques that may work well for you. These may include: ? Breathing exercises. ? Routines to release muscle tension. ? Visualizing peaceful scenes.  Make sure that you drive carefully. Avoid driving if you feel very sleepy.  Keep all follow-up visits as told by your health care provider. This is important. Contact a health care provider if:  You are tired throughout the day.  You have trouble in your daily routine due to sleepiness.  You continue to have sleep problems, or your sleep problems get worse. Get help right away if:  You have serious thoughts about hurting yourself or someone else. If you ever feel like you may hurt yourself or others, or have thoughts about taking your own life, get help right away. You can go to your nearest emergency department or call:  Your local emergency services (911 in the [place]  A suicide crisis helpline, such as the National Suicide Prevention Lifeline at 2405542454. This is open 24 hours a day. Summary  Insomnia is a sleep disorder that makes it difficult to fall asleep or stay asleep.  Insomnia can be long-term (chronic)  or short-term (acute).  Treatment for insomnia depends on the cause. Treatment may focus on treating an underlying condition that is causing insomnia.  Keep a sleep diary to help you and your health care provider figure out what could be causing your insomnia. This information is not intended to replace advice given to you by your health care provider. Make sure you discuss any questions you have with your health care provider. Document Revised: 09/26/2020 Document Reviewed: 09/26/2020 Elsevier Patient Education  2021 [employer].

## 2021-03-27 ENCOUNTER — Other Ambulatory Visit: Payer: Self-pay | Admitting: Family Medicine

## 2021-03-28 NOTE — Telephone Encounter (Signed)
Please clarify Rx - should not be on losartan if taking Entresto as that has valsartan.

## 2021-03-28 NOTE — Telephone Encounter (Signed)
Called pt LM to clarify if pt taking Losartan or Entresto as cannot take both due to Entresto containing Valsartan

## 2021-03-31 ENCOUNTER — Telehealth: Payer: Self-pay

## 2021-03-31 NOTE — Progress Notes (Signed)
Thanks for seeing her regarding evaluation of sleep apnea.   Felicia Hanson, [place], [facility]  Pager: [phone] Office: [phone]

## 2021-03-31 NOTE — Telephone Encounter (Signed)
Left message with husband to give me a call back to schedule sleep study

## 2021-04-01 ENCOUNTER — Other Ambulatory Visit: Payer: Self-pay | Admitting: Family Medicine

## 2021-04-01 DIAGNOSIS — R7989 Other specified abnormal findings of blood chemistry: Secondary | ICD-10-CM

## 2021-04-01 NOTE — Telephone Encounter (Signed)
Verified no taking losartan pt does not need this refill

## 2021-04-02 NOTE — Telephone Encounter (Signed)
Has upcoming appt - refilled for now.

## 2021-04-02 NOTE — Telephone Encounter (Signed)
LFD 01/12/21 #15 LOV 02/14/21 Do you still want patient on vitamin d.

## 2021-04-04 ENCOUNTER — Encounter: Payer: Self-pay | Admitting: Family Medicine

## 2021-04-04 ENCOUNTER — Other Ambulatory Visit: Payer: Self-pay | Admitting: Family Medicine

## 2021-04-04 ENCOUNTER — Other Ambulatory Visit: Payer: Self-pay | Admitting: Registered Nurse

## 2021-04-04 ENCOUNTER — Other Ambulatory Visit: Payer: Self-pay

## 2021-04-04 ENCOUNTER — Telehealth (INDEPENDENT_AMBULATORY_CARE_PROVIDER_SITE_OTHER): Payer: Medicare Other | Admitting: Family Medicine

## 2021-04-04 ENCOUNTER — Telehealth: Payer: Self-pay

## 2021-04-04 DIAGNOSIS — U071 COVID-19: Secondary | ICD-10-CM | POA: Diagnosis not present

## 2021-04-04 MED ORDER — EZETIMIBE 10 MG PO TABS: 10.0000 mg | ORAL_TABLET | Freq: Every day | ORAL | 2 refills | Status: DC

## 2021-04-04 MED ORDER — NIRMATRELVIR/RITONAVIR (PAXLOVID)TABLET: 2.0000 | ORAL_TABLET | Freq: Two times a day (BID) | ORAL | 0 refills | Status: DC

## 2021-04-04 NOTE — Telephone Encounter (Signed)
Patient called that she recently got diagnosed with Covid she won't be able to come in for her ov on Monday. Patient states she is having "a terrible cough and congestion" and was wondering if you could give her something for her cough. I told patient to reach out to her pcp as well.

## 2021-04-04 NOTE — Progress Notes (Signed)
I connected with  Felicia Hanson on 04/04/21 by a video enabled telemedicine application and verified that I am speaking with the correct person using two identifiers.   I discussed the limitations of evaluation and management by telemedicine. The patient expressed understanding and agreed to proceed.

## 2021-04-04 NOTE — Progress Notes (Signed)
Virtual Visit via Video   I connected with patient on 04/04/21 at  2:00 PM EDT by a video enabled telemedicine application and verified that I am speaking with the correct person using two identifiers.  Location patient: Home Location provider: [place] [facility], Office Persons participating in the virtual visit: Patient, Provider, CMA (Sabrina M)  I discussed the limitations of evaluation and management by telemedicine and the availability of in person appointments. The patient expressed understanding and agreed to proceed.  Interactive audio and video telecommunications were attempted between this provider and patient, however failed, due to patient having technical difficulties OR patient did not have access to video capability.  We continued and completed visit with audio only.   Subjective:   HPI:   COVID- pt reports 'a bad cough this morning' and 'some congestion in my throat'.  Temp ranging 99-100.  + body aches 'from my shoulders down' for the last week.  Pt tested + for COVID this morning.  Known + contact 3 days ago.  Pt is vaccinated- including boosters.  Her risk of complications is high due to her multiple medical issues- CHF, DM, COPD.  Right now she reports feeling ok and is in no distress.  ROS:   See pertinent positives and negatives per HPI.  Patient Active Problem List   Diagnosis Date Noted  . Loud snoring 03/24/2021  . Excessive daytime sleepiness 03/24/2021  . Acute on chronic combined systolic and diastolic HF (heart failure) (HCC) 03/24/2021  . Panlobular emphysema (HCC) 03/24/2021  . GAD (generalized anxiety disorder) 03/24/2021  . Atypical chest pain 01/22/2021  . Respiratory distress 01/20/2021  . Acute on chronic combined systolic and diastolic CHF (congestive heart failure) (HCC) 01/20/2021  . Type 2 diabetes mellitus without complication, without long-term current use of insulin (HCC) 10/26/2017  . Generalized anxiety disorder 01/10/2014  .  Vitamin D deficiency 01/10/2014  . Essential hypertension, benign 01/10/2014  . Pure hypercholesterolemia 01/10/2014  . Hypothyroidism 01/10/2014    Social History   Tobacco Use  . Smoking status: Current Every Day Smoker    Years: 38.00    Types: Cigarettes  . Smokeless tobacco: Never Used  . Tobacco comment: 1 pack per week  Substance Use Topics  . Alcohol use: No    Alcohol/week: 0.0 standard drinks    Current Outpatient Medications:  .  ALPRAZolam (XANAX) 1 MG tablet, Take 0.5 mg by mouth 3 (three) times daily as needed for anxiety or sleep. , Disp: , Rfl:  .  aspirin 81 MG tablet, Take 81 mg by mouth daily., Disp: , Rfl:  .  BLACK ELDERBERRY PO, Take 10 mLs by mouth daily., Disp: , Rfl:  .  Continuous Blood Gluc Receiver (DEXCOM G6 RECEIVER) DEVI, 1 Device by Does not apply route every morning. Device and supplies, Disp: 1 each, Rfl: 0 .  dapagliflozin propanediol (FARXIGA) 10 MG TABS tablet, Take 1 tablet (10 mg total) by mouth daily., Disp: 30 tablet, Rfl: 3 .  DULoxetine (CYMBALTA) 60 MG capsule, Take 2 capsules (120 mg total) by mouth daily., Disp: 180 capsule, Rfl: 1 .  ezetimibe (ZETIA) 10 MG tablet, Take 1 tablet (10 mg total) by mouth daily., Disp: 30 tablet, Rfl: 2 .  famotidine (PEPCID) 20 MG tablet, Take 20 mg by mouth daily as needed for heartburn or indigestion., Disp: , Rfl:  .  insulin glargine (LANTUS SOLOSTAR) 100 UNIT/ML Solostar Pen, Inject 38 Units into the skin daily., Disp: 45 mL, Rfl: 1 .  Insulin  Pen Needle 31G X 5 MM MISC, Inject 1 each into the skin daily., Disp: 100 each, Rfl: 2 .  levothyroxine (SYNTHROID) 88 MCG tablet, TAKE 1 TABLET (88 MCG TOTAL) BY MOUTH DAILY BEFORE BREAKFAST., Disp: 90 tablet, Rfl: 1 .  magnesium oxide (MAG-OX) 400 MG tablet, TAKE 1/2 TABLET (200 MG TOTAL) BY MOUTH TWO TIMES DAILY., Disp: 10 tablet, Rfl: 0 .  metoprolol succinate (TOPROL-XL) 100 MG 24 hr tablet, Take 1 tablet (100 mg total) by mouth daily. Take with or immediately  following a meal., Disp: 90 tablet, Rfl: 2 .  nitroGLYCERIN (NITROSTAT) 0.4 MG SL tablet, PLACE 1 TABLET UNDER THE TONGUE EVERY 5 MINUTES AS NEEDED FOR CHEST PAIN. IF YOU REQUIRE MORE THAN TWO TABLETS FIVE MINUTES APART GO TO THE NEAREST ER VIA EMS. (Patient taking differently: Place 0.4 mg under the tongue every 5 (five) minutes as needed for chest pain.), Disp: 25 tablet, Rfl: 1 .  ondansetron (ZOFRAN ODT) 4 MG disintegrating tablet, Take 1 tablet (4 mg total) by mouth every 8 (eight) hours as needed for nausea or vomiting., Disp: 20 tablet, Rfl: 1 .  pravastatin (PRAVACHOL) 80 MG tablet, Take 1 tablet (80 mg total) by mouth every evening., Disp: 90 tablet, Rfl: 1 .  sacubitril-valsartan (ENTRESTO) 97-103 MG, Take 1 tablet by mouth 2 (two) times daily., Disp: 60 tablet, Rfl: 3 .  spironolactone (ALDACTONE) 25 MG tablet, Take 0.5 tablets (12.5 mg total) by mouth daily. (Patient taking differently: Take 25 mg by mouth daily.), Disp: 30 tablet, Rfl: 3 .  Vitamin D, Ergocalciferol, (DRISDOL) 1.25 MG (50000 UNIT) CAPS capsule, TAKE 1 CAPSULE (50,000 UNITS TOTAL) BY MOUTH EVERY 7 (SEVEN) DAYS, Disp: 15 capsule, Rfl: 0 .  ACCU-CHEK AVIVA PLUS test strip, daily. for testing, Disp: , Rfl: 2 .  albuterol (VENTOLIN HFA) 108 (90 Base) MCG/ACT inhaler, Inhale 1 puff into the lungs every 6 (six) hours as needed for wheezing or shortness of breath., Disp: , Rfl:  .  naproxen sodium (ALEVE) 220 MG tablet, Take 220 mg by mouth daily as needed. (Patient not taking: Reported on 04/04/2021), Disp: , Rfl:  .  potassium chloride SA (KLOR-CON) 20 MEQ tablet, TAKE 1 TABLET (20 MEQ TOTAL) BY MOUTH DAILY. (Patient not taking: Reported on 04/04/2021), Disp: 2 tablet, Rfl: 0  Allergies  Allergen Reactions  . Metformin And Related Anaphylaxis    Could not swallow  . Nicotine Rash    Only allergic to nicotine patch  . Ace Inhibitors Cough    Cough with lisinopril.     Objective:   There were no vitals taken for this  visit.  Pt is able to speak clearly, coherently without shortness of breath or increased work of breathing.  Thought process is linear.  Mood is appropriate.   Assessment and Plan:   COVID- new.  Pt tested + today after her DIL tested + 3 days ago.  She reports cough started this morning but body aches started last week.  Given her multiple medical issues she is at high risk of serious COVID infxn but thankfully she has been vaccinated and boosted.  It is easier for her to take oral medication rather than have an antibody infusion so we spent quite a bit of time determining if Paxlovid is right for her.  I encouraged her to hold her statin during treatment and none of her other medications are on the extensive list of med interactions.  Since her GFR is 37 will adjust her dose to  1 tab of each twice daily x5 days.  Reviewed supportive care and red flags that should prompt return.  Pt expressed understanding and is in agreement w/ plan.    Neena Rhymes, MD 04/04/2021  Time spent with the patient: 27 minutes, of which >50% was spent in obtaining information about symptoms, reviewing previous labs, evaluations, and treatments, counseling about condition (please see the discussed topics above), and developing a plan to further investigate it; had a number of questions which I addressed.

## 2021-04-07 ENCOUNTER — Ambulatory Visit: Payer: Medicare Other | Admitting: Cardiology

## 2021-04-07 NOTE — Telephone Encounter (Signed)
Called pharmacy to clarify -  Rx has been filled to take as directed # 30 on 04/04/21.   Called home number and cell number - mobile number mailbox full, no answer on home phone - left message that I will try to reach her again.  Should only be taking 1 nirmatrelvir BID due to egfr in 30's. Will try to reach her again tomorrow.

## 2021-04-07 NOTE — Telephone Encounter (Signed)
Agree. Thanks

## 2021-04-08 ENCOUNTER — Telehealth: Payer: Self-pay | Admitting: Family Medicine

## 2021-04-08 NOTE — Telephone Encounter (Signed)
TAKE NIRMATRELVIR 1 TABLET TWICE DAILY FOR 5 DAYS & RITONAVIR ONE TABLET TWICE DAILY FOR 5 DAYS.  Pt confused by these directions. Please advise. Is this 1 tablet 2 times daily for 10 days?

## 2021-04-08 NOTE — Telephone Encounter (Signed)
Called pt - she has been taking 3 pills total twice per day. Tomorrow is last day of rx. Finishes rx tomorrow.  Advised to change to just one of nirmatrelvir for remainder of doses. understanding expressed.   Feeling better. Had red urine prior to starting paxlovid but that has resolved - clear urine now. Urinating normally. Drinking plenty of fluids. No abdominal pain. Denies urgency.  If any burning with urination, or return of red urine - advised ER or urgent care eval.   Fell on bathtub today as getting out of tub today. Hurt R side of ribs. No LOC, no head injury. Only slight soreness. No bruising or wounds seen. No dyspnea.  If any dyspnea, or increasing pain - advised to be seen in ER or urgent care.   understanding of plan expressed. 15 min call.

## 2021-04-08 NOTE — Telephone Encounter (Signed)
See other message - clarified meds and discussed other symptoms and plan.

## 2021-04-08 NOTE — Telephone Encounter (Signed)
Pt called in asking how she is to take the Paxlovid she is confused please advise

## 2021-04-09 ENCOUNTER — Ambulatory Visit: Payer: Medicare Other | Admitting: Family Medicine

## 2021-04-21 ENCOUNTER — Ambulatory Visit
Admission: EM | Admit: 2021-04-21 | Discharge: 2021-04-21 | Disposition: A | Discharge status: Home / Self Care | Payer: Medicare Other | Attending: Emergency Medicine | Admitting: Emergency Medicine

## 2021-04-21 ENCOUNTER — Other Ambulatory Visit: Payer: Self-pay

## 2021-04-21 DIAGNOSIS — R600 Localized edema: Secondary | ICD-10-CM

## 2021-04-21 NOTE — ED Triage Notes (Signed)
Pt c/o rt knee pain after stepping off a curb on Saturday and now has rt foot swelling. States did a lot of walking on Saturday.

## 2021-04-21 NOTE — ED Provider Notes (Signed)
HPI  SUBJECTIVE:  Felicia Hanson is a 65 y.o. female who presents with right lower extremity edema  2 days ago.  States that she twisted her right knee 3 days ago.  She has been ambulatory on her knee since twisting it.  She states that she has done a lot of walking and standing recently and reports some swelling in her left leg.  She has had swelling like this before after she has been standing for prolonged periods of time.  She states that she talked to the triage nurse who was concerned about possible DVT and sent her in for evaluation.  She denies calf or knee pain.  No shortness of breath, unintentional weight gain, PND, orthopnea, abdominal pain, change in her baseline nocturia.  No recent immobilization, surgery in the past 4 weeks, prolonged car rides, chest pain, shortness of breath, hemoptysis.  She has a past medical history of diabetes, hypertension, MI February 22, CHF, osteoarthritis right knee.  She is on 81 mg of aspirin.  No history of cancer, DVT, PE.  PMD: Dr. Meredith Staggers.  She has an appointment with him on 6/6.   Past Medical History:  Diagnosis Date  . Anxiety   . Coronary artery calcification   . Depression   . Diabetes mellitus without complication (HCC)   . Hyperlipidemia   . Hypertension   . Thyroid disease     Past Surgical History:  Procedure Laterality Date  . BREAST EXCISIONAL BIOPSY Left   . CHOLECYSTECTOMY    . COLON SURGERY     diverticulitis with perforation; s/p colon resection.  . Colonoscopy    . DENTAL SURGERY    . RIGHT/LEFT HEART CATH AND CORONARY ANGIOGRAPHY N/A 01/22/2021   Procedure: RIGHT/LEFT HEART CATH AND CORONARY ANGIOGRAPHY;  Surgeon: Yates Decamp, MD;  Location: [facility];  Service: Cardiovascular;  Laterality: N/A;  . TUBAL LIGATION      Family History  Problem Relation Age of Onset  . Cancer Mother        unknown primary  . Hyperlipidemia Mother   . Other Father   . Breast cancer Paternal Aunt     Social History    Tobacco Use  . Smoking status: Current Every Day Smoker    Years: 38.00    Types: Cigarettes  . Smokeless tobacco: Never Used  . Tobacco comment: 1 pack per week  Vaping Use  . Vaping Use: Former  Substance Use Topics  . Alcohol use: No    Alcohol/week: 0.0 standard drinks  . Drug use: No    No current facility-administered medications for this encounter.  Current Outpatient Medications:  .  ACCU-CHEK AVIVA PLUS test strip, daily. for testing, Disp: , Rfl: 2 .  albuterol (VENTOLIN HFA) 108 (90 Base) MCG/ACT inhaler, Inhale 1 puff into the lungs every 6 (six) hours as needed for wheezing or shortness of breath., Disp: , Rfl:  .  ALPRAZolam (XANAX) 1 MG tablet, Take 0.5 mg by mouth 3 (three) times daily as needed for anxiety or sleep. , Disp: , Rfl:  .  aspirin 81 MG tablet, Take 81 mg by mouth daily., Disp: , Rfl:  .  BLACK ELDERBERRY PO, Take 10 mLs by mouth daily., Disp: , Rfl:  .  Continuous Blood Gluc Receiver (DEXCOM G6 RECEIVER) DEVI, 1 Device by Does not apply route every morning. Device and supplies, Disp: 1 each, Rfl: 0 .  dapagliflozin propanediol (FARXIGA) 10 MG TABS tablet, Take 1 tablet (10 mg  total) by mouth daily., Disp: 30 tablet, Rfl: 3 .  DULoxetine (CYMBALTA) 60 MG capsule, Take 2 capsules (120 mg total) by mouth daily., Disp: 180 capsule, Rfl: 1 .  ezetimibe (ZETIA) 10 MG tablet, Take 1 tablet (10 mg total) by mouth daily., Disp: 30 tablet, Rfl: 2 .  famotidine (PEPCID) 20 MG tablet, Take 20 mg by mouth daily as needed for heartburn or indigestion., Disp: , Rfl:  .  insulin glargine (LANTUS SOLOSTAR) 100 UNIT/ML Solostar Pen, Inject 38 Units into the skin daily., Disp: 45 mL, Rfl: 1 .  Insulin Pen Needle 31G X 5 MM MISC, Inject 1 each into the skin daily., Disp: 100 each, Rfl: 2 .  levothyroxine (SYNTHROID) 88 MCG tablet, TAKE 1 TABLET (88 MCG TOTAL) BY MOUTH DAILY BEFORE BREAKFAST., Disp: 90 tablet, Rfl: 1 .  magnesium oxide (MAG-OX) 400 MG tablet, TAKE 1/2  TABLET (200 MG TOTAL) BY MOUTH TWO TIMES DAILY., Disp: 10 tablet, Rfl: 0 .  metoprolol succinate (TOPROL-XL) 100 MG 24 hr tablet, Take 1 tablet (100 mg total) by mouth daily. Take with or immediately following a meal., Disp: 90 tablet, Rfl: 2 .  nitroGLYCERIN (NITROSTAT) 0.4 MG SL tablet, PLACE 1 TABLET UNDER THE TONGUE EVERY 5 MINUTES AS NEEDED FOR CHEST PAIN. IF YOU REQUIRE MORE THAN TWO TABLETS FIVE MINUTES APART GO TO THE NEAREST ER VIA EMS. (Patient taking differently: Place 0.4 mg under the tongue every 5 (five) minutes as needed for chest pain.), Disp: 25 tablet, Rfl: 1 .  ondansetron (ZOFRAN ODT) 4 MG disintegrating tablet, Take 1 tablet (4 mg total) by mouth every 8 (eight) hours as needed for nausea or vomiting., Disp: 20 tablet, Rfl: 1 .  PAXLOVID 20 x 150 MG & 10 x 100MG  TBPK, TAKE NIRMATRELVIR 1 TABLET TWICE DAILY FOR 5 DAYS & RITONAVIR ONE TABLET TWICE DAILY FOR 5 DAYS., Disp: 30 tablet, Rfl: 0 .  pravastatin (PRAVACHOL) 80 MG tablet, Take 1 tablet (80 mg total) by mouth every evening., Disp: 90 tablet, Rfl: 1 .  sacubitril-valsartan (ENTRESTO) 97-103 MG, Take 1 tablet by mouth 2 (two) times daily., Disp: 60 tablet, Rfl: 3 .  spironolactone (ALDACTONE) 25 MG tablet, Take 0.5 tablets (12.5 mg total) by mouth daily. (Patient taking differently: Take 25 mg by mouth daily.), Disp: 30 tablet, Rfl: 3 .  Vitamin D, Ergocalciferol, (DRISDOL) 1.25 MG (50000 UNIT) CAPS capsule, TAKE 1 CAPSULE (50,000 UNITS TOTAL) BY MOUTH EVERY 7 (SEVEN) DAYS, Disp: 15 capsule, Rfl: 0  Allergies  Allergen Reactions  . Metformin And Related Anaphylaxis    Could not swallow  . Nicotine Rash    Only allergic to nicotine patch  . Ace Inhibitors Cough    Cough with lisinopril.      ROS  As noted in HPI.   Physical Exam  BP 139/60 (BP Location: Left Arm)   Pulse 74   Temp 97.8 F (36.6 C) (Oral)   Resp 18   SpO2 97%   Constitutional: Well developed, well nourished, no acute distress Eyes:  EOMI,  conjunctiva normal bilaterally HENT: Normocephalic, atraumatic,mucus membranes moist Respiratory: Normal inspiratory effort, lungs clear bilaterally. Cardiovascular: Normal rate GI: nondistended skin: No rash, skin intact Musculoskeletal: no deformities Mild swelling along the knee.  No erythema, effusion, crepitus.  No tenderness along the entire knee.  No pain with PROM.  No medial, lateral joint tenderness.  Joint stable to varus/valgus stress.  Negative McMurray, negative anterior drawer test.  Sensation distally intact. Right calf 35 cm,  left calf 33.5 cm.  Bilateral 1-2+ pitting edema.  No erythema.  DP 2+ and equal bilaterally.  No tenderness in the popliteal region, no palpable cord, no tenderness of the gastrocnemius or medial thigh. Neurologic: Alert & oriented x 3, no focal neuro deficits Psychiatric: Speech and behavior appropriate   ED Course   Medications - No data to display  No orders of the defined types were placed in this encounter.   No results found for this or any previous visit (from the past 24 hour(s)). No results found.  ED Clinical Impression  1. Lower extremity edema      ED Assessment/Plan  Doubt CHF exacerbation.  Wells score 0.  I have a very low clinical suspicion for DVT.  She has been on her feet a whole lot and she did recently twist her knee.  Her knee is nontender and she is ambulatory.  There is no obvious effusion.  She has no tenderness along the deep venous system.,  Vitals are normal, she has bilateral pitting edema.  Suspect it is from being on her feet most recently.  However, discussed with patient that I could not entirely rule this out as I do not have the tools to do this and if we were to rule it out she would need to go to the emergency department.  She has opted to try compression stockings, elevation, and following up with her primary care provider as scheduled on June 6.  Discussed signs and symptoms that should prompt return to the  emergency department or if there is any concern from the patient or family, she is to go to the ED.  Patient agrees with plan  Discussed MDM, treatment plan, and plan for follow-up with patient. Discussed sn/sx that should prompt return to the ED. patient agrees with plan.   No orders of the defined types were placed in this encounter.  *This clinic note was created using Dragon dictation software. Therefore, there may be occasional mistakes despite careful proofreading.  ?    Domenick Gong, MD 04/21/21 [phone]

## 2021-04-21 NOTE — Discharge Instructions (Addendum)
Try compression stockings, elevation above your heart is much as possible, and cut back  on your sodium.  Please go to the ED for pain, redness, worsening swelling, or for any concerns whatsoever.

## 2021-04-22 ENCOUNTER — Other Ambulatory Visit: Payer: Self-pay | Admitting: Family Medicine

## 2021-04-22 ENCOUNTER — Other Ambulatory Visit: Payer: Self-pay | Admitting: Cardiology

## 2021-04-22 DIAGNOSIS — I5042 Chronic combined systolic (congestive) and diastolic (congestive) heart failure: Secondary | ICD-10-CM

## 2021-05-01 ENCOUNTER — Other Ambulatory Visit: Payer: Self-pay

## 2021-05-01 ENCOUNTER — Ambulatory Visit (INDEPENDENT_AMBULATORY_CARE_PROVIDER_SITE_OTHER): Payer: Medicare Other | Admitting: Neurology

## 2021-05-01 DIAGNOSIS — R0602 Shortness of breath: Secondary | ICD-10-CM

## 2021-05-01 DIAGNOSIS — G4719 Other hypersomnia: Secondary | ICD-10-CM

## 2021-05-01 DIAGNOSIS — R0683 Snoring: Secondary | ICD-10-CM

## 2021-05-01 DIAGNOSIS — J431 Panlobular emphysema: Secondary | ICD-10-CM

## 2021-05-01 DIAGNOSIS — G4734 Idiopathic sleep related nonobstructive alveolar hypoventilation: Secondary | ICD-10-CM

## 2021-05-01 DIAGNOSIS — I5043 Acute on chronic combined systolic (congestive) and diastolic (congestive) heart failure: Secondary | ICD-10-CM

## 2021-05-01 DIAGNOSIS — G4733 Obstructive sleep apnea (adult) (pediatric): Secondary | ICD-10-CM | POA: Diagnosis not present

## 2021-05-01 DIAGNOSIS — F411 Generalized anxiety disorder: Secondary | ICD-10-CM

## 2021-05-05 ENCOUNTER — Other Ambulatory Visit: Payer: Self-pay

## 2021-05-05 ENCOUNTER — Ambulatory Visit (INDEPENDENT_AMBULATORY_CARE_PROVIDER_SITE_OTHER): Payer: Medicare Other | Admitting: Family Medicine

## 2021-05-05 ENCOUNTER — Encounter: Payer: Self-pay | Admitting: Family Medicine

## 2021-05-05 VITALS — BP 128/76 | HR 80 | Temp 98.0°F | Resp 15 | Ht 60.0 in | Wt 167.0 lb

## 2021-05-05 DIAGNOSIS — R7989 Other specified abnormal findings of blood chemistry: Secondary | ICD-10-CM

## 2021-05-05 DIAGNOSIS — F439 Reaction to severe stress, unspecified: Secondary | ICD-10-CM

## 2021-05-05 DIAGNOSIS — F418 Other specified anxiety disorders: Secondary | ICD-10-CM | POA: Diagnosis not present

## 2021-05-05 DIAGNOSIS — E1165 Type 2 diabetes mellitus with hyperglycemia: Secondary | ICD-10-CM | POA: Diagnosis not present

## 2021-05-05 DIAGNOSIS — M7989 Other specified soft tissue disorders: Secondary | ICD-10-CM

## 2021-05-05 DIAGNOSIS — E7800 Pure hypercholesterolemia, unspecified: Secondary | ICD-10-CM | POA: Diagnosis not present

## 2021-05-05 DIAGNOSIS — J309 Allergic rhinitis, unspecified: Secondary | ICD-10-CM

## 2021-05-05 DIAGNOSIS — R059 Cough, unspecified: Secondary | ICD-10-CM

## 2021-05-05 MED ORDER — LANTUS SOLOSTAR 100 UNIT/ML ~~LOC~~ SOPN: 42.0000 [IU] | PEN_INJECTOR | Freq: Every day | SUBCUTANEOUS | 5 refills | Status: DC

## 2021-05-05 MED ORDER — PRAVASTATIN SODIUM 80 MG PO TABS: 80.0000 mg | ORAL_TABLET | Freq: Every evening | ORAL | 1 refills | Status: DC

## 2021-05-05 NOTE — Progress Notes (Signed)
Subjective:  Patient ID: Felicia Hanson, female    DOB: 1956-07-09  Age: 65 y.o. MRN: [id number]  CC:  Chief Complaint  Patient presents with  . Nasal Congestion    Pt notes has had congestion and cough since this morning, notes concern due to congestive heart failure, would like to be checked out, notes she is prone to seasonal allergies, grandson had sinus infection last week.   . Diabetes    Pt here for follow up and refill medication    HPI Keandrea LORECE KEACH presents for   Cough and Congestion: Reports granddaughter had ear and sinus infection last week.  Does have history of seasonal allergies.  However also has history of CHF (acute on chronic systolic and diastolic heart failure, with respiratory failure, hypoxic in February.  Nonischemic cardiomyopathy with severe LV systolic dysfunction mild to moderate pulmonary hypertension, EF 35 to 40% in February.  Plan for repeat echo in 90 days, has not yet had performed).  She was treated for COVID-19 infection May 6 with Paxlovid.   Started with slight cough and upper chest congestion. Slight scratchy throat this morning. No sneezing. No fever. No home test for covid today. No current allergy treatments. No prior nasal spray use.  Used zyrtec in past - not recent. No chest pains, no dyspnea.   R leg swelling  for about 2 weeks, no calf pain, but noticed after increased walking and some pain in R knee in past - knee swells at times - nothing new. No prior DVT.  No recent prolonged car travel or air travel, no recent calf pain or swelling. [facility] 5/[facility]. Wells score 0 - low suspicion for DVT, has not had ultrasound.  No specific treatments other than Epsom salt soaks.  Swelling same.   Diabetes: Complicated by previous history of hyperglycemia, microalbuminuria. Treated with Lantus, Wilder Glade.  She is on ARB inhibitor as well as statin.   On Entresto for ARB.  Creatinine has been increasing. Prior range 0.99, increased to 1.54  in March.  Avoiding nsaids.  Drinking fluids, but decreased appetite with covid illness, family stressors.  Home readings - random 159-290 no symptomatic lows.  Taking lantus insulin - on 40 units, then 42 units in past week. Has not checked today.  Microalbumin: Ratio 107 on 04/18/2020 Optho, foot exam, pneumovax: Due for ophthalmology exam - plans on appt in October.  Pneumovax in 2018. More blurry vision past few weeks. No n/v/abd pain.   Lab Results  Component Value Date   HGBA1C 6.8 (H) 12/06/2020   HGBA1C 9.1 (A) 09/06/2020   HGBA1C 11.5 (A) 07/08/2020   Lab Results  Component Value Date   LDLCALC 80 12/06/2020   CREATININE 1.54 (H) 02/21/2021   Depression: Treated with Cymbalta, has been followed by [facility] - every 6 months.  Working fine at current dose, some increased stressors at home, has not met with therapist in past 2 months. Wants to met with different therapist - plans to discuss with Dr. [facility]. No SI/HI.  Depression screen [facility] 2/9 05/05/2021 04/04/2021 12/11/2020 12/06/2020 11/11/2020  Decreased Interest 0 0 0 0 0  Down, Depressed, Hopeless 2 0 0 0 0  PHQ - 2 Score 2 0 0 0 0  Altered sleeping 2 0 - - -  Tired, decreased energy 0 0 - - -  Change in appetite 1 0 - - -  Feeling bad or failure about yourself  0 0 - - -  Trouble  concentrating 1 0 - - -  Moving slowly or fidgety/restless 1 0 - - -  Suicidal thoughts 0 0 - - -  PHQ-9 Score 7 0 - - -  Difficult doing work/chores - Not difficult at all - - -  Some recent data might be hidden      History Patient Active Problem List   Diagnosis Date Noted  . Loud snoring 03/24/2021  . Excessive daytime sleepiness 03/24/2021  . Acute on chronic combined systolic and diastolic HF (heart failure) ([place]) 03/24/2021  . Panlobular emphysema ([place]) 03/24/2021  . GAD (generalized anxiety disorder) 03/24/2021  . Atypical chest pain 01/22/2021  . Respiratory distress 01/20/2021  . Acute on chronic combined systolic and diastolic  CHF (congestive heart failure) ([place]) 01/20/2021  . Type 2 diabetes mellitus without complication, without long-term current use of insulin ([employer]) 10/26/2017  . Generalized anxiety disorder 01/10/2014  . Vitamin D deficiency 01/10/2014  . Essential hypertension, benign 01/10/2014  . Pure hypercholesterolemia 01/10/2014  . Hypothyroidism 01/10/2014   Past Medical History:  Diagnosis Date  . Anxiety   . Coronary artery calcification   . Depression   . Diabetes mellitus without complication ([place])   . Hyperlipidemia   . Hypertension   . Thyroid disease    Past Surgical History:  Procedure Laterality Date  . BREAST EXCISIONAL BIOPSY Left   . CHOLECYSTECTOMY    . COLON SURGERY     diverticulitis with perforation; s/p colon resection.  . Colonoscopy    . DENTAL SURGERY    . RIGHT/LEFT HEART CATH AND CORONARY ANGIOGRAPHY N/A 01/22/2021   Procedure: RIGHT/LEFT HEART CATH AND CORONARY ANGIOGRAPHY;  Surgeon: Adrian Prows, MD;  Location: [place] [facility];  Service: Cardiovascular;  Laterality: N/A;  . TUBAL LIGATION     Allergies  Allergen Reactions  . Metformin And Related Anaphylaxis    Could not swallow  . Nicotine Rash    Only allergic to nicotine patch  . Ace Inhibitors Cough    Cough with lisinopril.    Prior to Admission medications   Medication Sig Start Date End Date Taking? Authorizing Provider  ACCU-CHEK AVIVA PLUS test strip daily. for testing 03/11/18  Yes [provider]  albuterol (VENTOLIN HFA) 108 (90 Base) MCG/ACT inhaler Inhale 1 puff into the lungs every 6 (six) hours as needed for wheezing or shortness of breath.   Yes [provider]  ALPRAZolam Duanne Moron) 1 MG tablet Take 0.5 mg by mouth 3 (three) times daily as needed for anxiety or sleep.  08/21/13  Yes [provider]  aspirin 81 MG tablet Take 81 mg by mouth daily.   Yes [provider]  BLACK ELDERBERRY PO Take 10 mLs by mouth daily.   Yes [provider]  Continuous  Blood Gluc Receiver (DEXCOM G6 RECEIVER) DEVI 1 Device by Does not apply route every morning. Device and supplies 04/18/20  Yes Wendie Agreste, MD  dapagliflozin propanediol (FARXIGA) 10 MG TABS tablet Take 1 tablet (10 mg total) by mouth daily. 01/25/21  Yes Regalado, Belkys A, MD  DULoxetine (CYMBALTA) 60 MG capsule Take 2 capsules (120 mg total) by mouth daily. 12/06/20  Yes Wendie Agreste, MD  ezetimibe (ZETIA) 10 MG tablet TAKE 1 TABLET BY MOUTH EVERY DAY 04/23/21  Yes Wendie Agreste, MD  famotidine (PEPCID) 20 MG tablet Take 20 mg by mouth daily as needed for heartburn or indigestion.   Yes [provider]  insulin glargine (LANTUS SOLOSTAR) 100 UNIT/ML Solostar  Pen Inject 38 Units into the skin daily. Patient taking differently: Inject 42 Units into the skin daily. 12/31/20  Yes Wendie Agreste, MD  Insulin Pen Needle 31G X 5 MM MISC Inject 1 each into the skin daily. 02/10/21  Yes Wendie Agreste, MD  levothyroxine (SYNTHROID) 88 MCG tablet TAKE 1 TABLET (88 MCG TOTAL) BY MOUTH DAILY BEFORE BREAKFAST. 01/19/21  Yes Wendie Agreste, MD  magnesium oxide (MAG-OX) 400 (240 Mg) MG tablet TAKE 0.5 TABLETS (200 MG TOTAL) BY MOUTH 2 (TWO) TIMES DAILY. 04/29/21  Yes Tolia, Sunit, DO  magnesium oxide (MAG-OX) 400 MG tablet TAKE 1/2 TABLET (200 MG TOTAL) BY MOUTH TWO TIMES DAILY. 01/24/21 01/24/22 Yes Regalado, Belkys A, MD  metoprolol succinate (TOPROL-XL) 100 MG 24 hr tablet Take 1 tablet (100 mg total) by mouth daily. Take with or immediately following a meal. 02/25/21  Yes Tolia, Sunit, DO  nitroGLYCERIN (NITROSTAT) 0.4 MG SL tablet PLACE 1 TABLET UNDER THE TONGUE EVERY 5 MINUTES AS NEEDED FOR CHEST PAIN. IF YOU REQUIRE MORE THAN TWO TABLETS FIVE MINUTES APART GO TO THE NEAREST ER VIA EMS. Patient taking differently: Place 0.4 mg under the tongue every 5 (five) minutes as needed for chest pain. 07/29/20  Yes Tolia, Sunit, DO  ondansetron (ZOFRAN ODT) 4 MG disintegrating tablet Take 1 tablet  (4 mg total) by mouth every 8 (eight) hours as needed for nausea or vomiting. 07/14/19  Yes Stallings, Zoe A, MD  PAXLOVID 20 x 150 MG & 10 x $Re'100MG'qcS$  TBPK TAKE NIRMATRELVIR 1 TABLET TWICE DAILY FOR 5 DAYS & RITONAVIR ONE TABLET TWICE DAILY FOR 5 DAYS. 04/07/21  Yes Maximiano Coss, NP  sacubitril-valsartan (ENTRESTO) 97-103 MG Take 1 tablet by mouth 2 (two) times daily. 03/07/21  Yes Tolia, Sunit, DO  spironolactone (ALDACTONE) 25 MG tablet Take 0.5 tablets (12.5 mg total) by mouth daily. Patient taking differently: Take 25 mg by mouth daily. 01/29/21  Yes Katherine Roan, MD  Vitamin D, Ergocalciferol, (DRISDOL) 1.25 MG (50000 UNIT) CAPS capsule TAKE 1 CAPSULE (50,000 UNITS TOTAL) BY MOUTH EVERY 7 (SEVEN) DAYS 04/02/21  Yes Wendie Agreste, MD  pravastatin (PRAVACHOL) 80 MG tablet Take 1 tablet (80 mg total) by mouth every evening. 12/06/20 03/06/21  Wendie Agreste, MD   Social History   Socioeconomic History  . Marital status: Married    Spouse name: Not on file  . Number of children: 2  . Years of education: some college  . Highest education level: Not on file  Occupational History  . Occupation: Retired  Tobacco Use  . Smoking status: Current Every Day Smoker    Years: 38.00    Types: Cigarettes  . Smokeless tobacco: Never Used  . Tobacco comment: 1 pack per week  Vaping Use  . Vaping Use: Former  Substance and Sexual Activity  . Alcohol use: No    Alcohol/week: 0.0 standard drinks  . Drug use: No  . Sexual activity: Not Currently  Other Topics Concern  . 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   Left-handed.   Caffeine use: 2.5 cups caffeine per day.   Social Determinants of Health   Financial  Resource Strain: Low Risk   . Difficulty of Paying Living Expenses:  Not very hard  Food Insecurity: No Food Insecurity  . Worried About Charity fundraiser in the Last Year: Never true  . Ran Out of Food in the Last Year: Never true  Transportation Needs: No Transportation Needs  . Lack of Transportation (Medical): No  . Lack of Transportation (Non-Medical): No  Physical Activity: Not on file  Stress: Not on file  Social Connections: Not on file  Intimate Partner Violence: Not on file    Review of Systems Per HPI.   Objective:   Vitals:   05/05/21 1429  BP: 128/76  Pulse: 80  Resp: 15  Temp: 98 F (36.7 C)  TempSrc: Temporal  SpO2: 97%  Weight: 167 lb (75.8 kg)  Height: 5' (1.524 m)     Physical Exam Vitals reviewed.  Constitutional:      Appearance: She is well-developed.  HENT:     Head: Normocephalic and atraumatic.  Eyes:     Conjunctiva/sclera: Conjunctivae normal.     Pupils: Pupils are equal, round, and reactive to light.  Neck:     Vascular: No carotid bruit.  Cardiovascular:     Rate and Rhythm: Normal rate and regular rhythm.     Heart sounds: Normal heart sounds.  Pulmonary:     Effort: Pulmonary effort is normal.     Breath sounds: Normal breath sounds.  Abdominal:     Palpations: Abdomen is soft. There is no pulsatile mass.     Tenderness: There is no abdominal tenderness.  Musculoskeletal:     Right lower leg: Edema (Right calf 35 cm at 15 cm below patella, 33 cm on the left.  Negative, Homans, no cords.  Slight tighter right calf versus left.) present.  Skin:    General: Skin is warm and dry.  Neurological:     Mental Status: She is alert and oriented to person, place, and time.  Psychiatric:        Behavior: Behavior normal.    55 minutes spent during visit, including chart review, counseling and assimilation of information, exam, discussion of multiple new concerns including cough and calf swelling  and follow up chronic issues above,  plan, and chart completion.    Assessment & Plan:  TURNER KUNZMAN  is a 65 y.o. female . Uncontrolled type 2 diabetes mellitus with hyperglycemia ([place]) - Plan: insulin glargine (LANTUS SOLOSTAR) 100 UNIT/ML Solostar Pen, Hemoglobin A1c  -unontrolled by home readings.  Check A1c to determine changes.  Continue same dose meds for now.  ER precautions given with symptoms of significant hyperglycemia.  Understanding expressed.  Depression with anxiety Situational stress  -Unfortunately has had some significant stressors the past few years including her own health and family stressors.  Reviewed importance of ongoing therapy/counseling.  Recommended she contact her psychiatrist for a different therapist if that would be a better fit.  Continue on same med regimen for now.  Hypercholesterolemia - Plan: pravastatin (PRAVACHOL) 80 MG tablet, Comprehensive metabolic panel, Lipid panel  -Tolerating current regimen, continue same dose pravastatin, check labs  Cough Allergic rhinitis, unspecified seasonality, unspecified trigger  -Unlikely COVID-19 infection as she just had infection in May.  Minimal symptoms, certainly could be other viral illness from family member versus allergic rhinitis.  Trial of Zyrtec plus or minus Flonase with home testing for COVID as option, RTC/ER/urgent care precautions.  Elevated serum creatinine  -Some increase in creatinine recently.  Check labs as  above.  Continue to avoid nephrotoxins.  Right leg swelling - Plan: VAS [place] LOWER EXTREMITY VENOUS (DVT)  -Potentially could be related to underlying knee issues and dependent edema.  As swelling has persisted we will check ultrasound to rule out DVT although less likely.  Meds ordered this encounter  Medications  . insulin glargine (LANTUS SOLOSTAR) 100 UNIT/ML Solostar Pen    Sig: Inject 42 Units into the skin daily.    Dispense:  15 mL    Refill:  5  . pravastatin (PRAVACHOL) 80 MG tablet    Sig: Take 1 tablet (80 mg total) by mouth every evening.    Dispense:  90 tablet    Refill:  1    Patient Instructions   Contact your psychiatrist to discuss meds and new therapist. Cymbalta can refilled and ordered by your psychiatrist. Call Dr. [facility] about new therapist as soon as possible.  I really think that will be helpful. If needed for any worsening or acute depression, here is an urgent resource: [facility] [place] [facility] the [facility]. [address], [place], [place] [address] [phone].   It would be unlikely for you to have COVID infection with recent infection in May.  I do recommend trying Zyrtec over-the-counter, or Flonase nasal spray for possible allergy cause.  If any worsening cough or congestion I would do recommend further evaluation, either video visit with [place], or urgent care visit.  I will check an ultrasound for your leg swelling, but that could be related to your knee.  If that is worsening or not improving please schedule follow-up visit or I can refer you to a specialist if needed for your knee.  Continue same dose of insulin for now until I see your A1c and other lab work.  Follow-up in 2 weeks.  At that time please bring a copy of fasting blood sugars as well as 2 hours after meal blood sugars.  Okay to check just once or twice per day.   Return to the clinic or go to the nearest emergency room if any of your symptoms worsen or new symptoms occur.      Signed, Merri Ray, MD [facility]

## 2021-05-05 NOTE — Patient Instructions (Addendum)
  Contact your psychiatrist to discuss meds and new therapist. Cymbalta can refilled and ordered by your psychiatrist. Call Dr. Evelene Croon about new therapist as soon as possible.  I really think that will be helpful. If needed for any worsening or acute depression, here is an urgent resource: [facility]. [address] [place], [place] [address] [phone].   It would be unlikely for you to have COVID infection with recent infection in May.  I do recommend trying Zyrtec over-the-counter, or Flonase nasal spray for possible allergy cause.  If any worsening cough or congestion I would do recommend further evaluation, either video visit with [place], or urgent care visit.  I will check an ultrasound for your leg swelling, but that could be related to your knee.  If that is worsening or not improving please schedule follow-up visit or I can refer you to a specialist if needed for your knee.  Continue same dose of insulin for now until I see your A1c and other lab work.  Follow-up in 2 weeks.  At that time please bring a copy of fasting blood sugars as well as 2 hours after meal blood sugars.  Okay to check just once or twice per day.   Return to the clinic or go to the nearest emergency room if any of your symptoms worsen or new symptoms occur.

## 2021-05-06 ENCOUNTER — Ambulatory Visit: Payer: Medicare Other | Admitting: Cardiology

## 2021-05-06 ENCOUNTER — Encounter: Payer: Self-pay | Admitting: Cardiology

## 2021-05-06 VITALS — BP 153/77 | HR 86 | Temp 97.5°F | Resp 17 | Ht 60.0 in | Wt 167.4 lb

## 2021-05-06 DIAGNOSIS — R0600 Dyspnea, unspecified: Secondary | ICD-10-CM

## 2021-05-06 DIAGNOSIS — I447 Left bundle-branch block, unspecified: Secondary | ICD-10-CM

## 2021-05-06 DIAGNOSIS — Z794 Long term (current) use of insulin: Secondary | ICD-10-CM

## 2021-05-06 DIAGNOSIS — F17210 Nicotine dependence, cigarettes, uncomplicated: Secondary | ICD-10-CM

## 2021-05-06 DIAGNOSIS — I428 Other cardiomyopathies: Secondary | ICD-10-CM

## 2021-05-06 DIAGNOSIS — E1165 Type 2 diabetes mellitus with hyperglycemia: Secondary | ICD-10-CM

## 2021-05-06 DIAGNOSIS — I10 Essential (primary) hypertension: Secondary | ICD-10-CM

## 2021-05-06 DIAGNOSIS — I5042 Chronic combined systolic (congestive) and diastolic (congestive) heart failure: Secondary | ICD-10-CM

## 2021-05-06 DIAGNOSIS — I2510 Atherosclerotic heart disease of native coronary artery without angina pectoris: Secondary | ICD-10-CM

## 2021-05-06 DIAGNOSIS — R0609 Other forms of dyspnea: Secondary | ICD-10-CM

## 2021-05-06 DIAGNOSIS — E7800 Pure hypercholesterolemia, unspecified: Secondary | ICD-10-CM

## 2021-05-06 LAB — COMPREHENSIVE METABOLIC PANEL
ALT: 9 U/L (ref 0–35)
AST: 16 U/L (ref 0–37)
Albumin: 3.7 g/dL (ref 3.5–5.2)
Alkaline Phosphatase: 82 U/L (ref 39–117)
BUN: 11 mg/dL (ref 6–23)
CO2: 26 mEq/L (ref 19–32)
Calcium: 9.4 mg/dL (ref 8.4–10.5)
Chloride: 99 mEq/L (ref 96–112)
Creatinine, Ser: 0.97 mg/dL (ref 0.40–1.20)
GFR: 60.94 mL/min (ref >60.00)
Glucose, Bld: 167 mg/dL — ABNORMAL HIGH (ref 70–99)
Potassium: 3.9 mEq/L (ref 3.5–5.1)
Sodium: 136 mEq/L (ref 135–145)
Total Bilirubin: 0.5 mg/dL (ref 0.2–1.2)
Total Protein: 7.2 g/dL (ref 6.0–8.3)

## 2021-05-06 LAB — LIPID PANEL
Cholesterol: 130 mg/dL (ref 0–200)
HDL: 44.5 mg/dL (ref >39.00)
LDL Cholesterol: 62 mg/dL (ref 0–99)
NonHDL: 85.92
Total CHOL/HDL Ratio: 3
Triglycerides: 118 mg/dL (ref 0.0–149.0)
VLDL: 23.6 mg/dL (ref 0.0–40.0)

## 2021-05-06 LAB — HEMOGLOBIN A1C: Hgb A1c MFr Bld: 11.9 % — ABNORMAL HIGH (ref 4.6–6.5)

## 2021-05-06 NOTE — Progress Notes (Signed)
Date:  05/06/2021   ID:  Rodena Mediniane G Mcburney, DOB 09-21-1956, MRN [id number]  PCP:  Shade FloodGreene, Jeffrey R, MD  Cardiologist:  Tessa LernerSunit Ainsley Deakins, DO, [facility] (established care 04/22/2020)  Date: 05/06/21 Last Office Visit: 02/06/2021  Chief Complaint  Patient presents with  . Follow-up    1 month  . Congestive Heart Failure    HPI   Kenedi Orlene OchG Preston is a 65 y.o. female who presents to the office with a  chief complaint of " 1041-month follow-up for congestive heart failure management."  Her past medical history and cardiovascular risk factors are: COVID-19 infection,insulin-dependent type 2 diabetes with hyperglycemia, pure hypercholesterolemia, hypertriglyceridemia, coronary artery calcification, nonischemic cardiomyopathy, left bundle branch block, essential hypertension, hypothyroidism, tobacco use disorder, vitamin D deficiency, generalized anxiety disorder.  She is accompanied by her Cheron EverySister Sherry at today's visit and patient provides verbal consent with regards to discussing her information in her sister's presence.  Last congestive heart failure hospitalization February 2022.  Since last office visit patient has not been hospitalized for cardiovascular symptoms.  Patient states that she is overall euvolemic and does not have lower extremity swelling, orthopnea, or PND.  She does have chronic dyspnea which is stable due to other noncardiac conditions.  Patient states that she is compliant with her current medical therapy.  Since last office visit she had COVID-19 infection and is recovering as expected.  She recently went to urgent care after a leg injury and is scheduled to have a lower extremity duplex to rule out DVT.  Patient's office blood pressures are not well controlled.  But states that her home blood pressures are usually around 130 mmHg or less.  Patient states that she is under a lot of stress due to her granddaughter being sick.  Unfortunately, since last office visit her hemoglobin A1c has  trended up to 11.9.  FUNCTIONAL STATUS: No structured exercise program or daily routine.  But does actively take care of her grandkids.  ALLERGIES: Allergies  Allergen Reactions  . Metformin And Related Anaphylaxis    Could not swallow  . Nicotine Rash    Only allergic to nicotine patch  . Ace Inhibitors Cough    Cough with lisinopril.     MEDICATION LIST PRIOR TO VISIT: Current Meds  Medication Sig  . ACCU-CHEK AVIVA PLUS test strip daily. for testing  . albuterol (VENTOLIN HFA) 108 (90 Base) MCG/ACT inhaler Inhale 1 puff into the lungs every 6 (six) hours as needed for wheezing or shortness of breath.  . ALPRAZolam (XANAX) 1 MG tablet Take 0.5 mg by mouth 3 (three) times daily as needed for anxiety or sleep.   Marland Kitchen. aspirin 81 MG tablet Take 81 mg by mouth daily.  Marland Kitchen. BLACK ELDERBERRY PO Take 10 mLs by mouth daily.  . Continuous Blood Gluc Receiver (DEXCOM G6 RECEIVER) DEVI 1 Device by Does not apply route every morning. Device and supplies  . dapagliflozin propanediol (FARXIGA) 10 MG TABS tablet Take 1 tablet (10 mg total) by mouth daily.  . DULoxetine (CYMBALTA) 60 MG capsule Take 2 capsules (120 mg total) by mouth daily.  Marland Kitchen. ezetimibe (ZETIA) 10 MG tablet TAKE 1 TABLET BY MOUTH EVERY DAY  . famotidine (PEPCID) 20 MG tablet Take 20 mg by mouth daily as needed for heartburn or indigestion.  . insulin glargine (LANTUS SOLOSTAR) 100 UNIT/ML Solostar Pen Inject 42 Units into the skin daily.  . Insulin Pen Needle 31G X 5 MM MISC Inject 1 each into the skin daily.  .Marland Kitchen  levothyroxine (SYNTHROID) 88 MCG tablet TAKE 1 TABLET (88 MCG TOTAL) BY MOUTH DAILY BEFORE BREAKFAST.  . magnesium oxide (MAG-OX) 400 (240 Mg) MG tablet TAKE 0.5 TABLETS (200 MG TOTAL) BY MOUTH 2 (TWO) TIMES DAILY.  . metoprolol succinate (TOPROL-XL) 100 MG 24 hr tablet Take 1 tablet (100 mg total) by mouth daily. Take with or immediately following a meal.  . naproxen sodium (ALEVE) 220 MG tablet Take 220 mg by mouth daily as  needed.  . nitroGLYCERIN (NITROSTAT) 0.4 MG SL tablet PLACE 1 TABLET UNDER THE TONGUE EVERY 5 MINUTES AS NEEDED FOR CHEST PAIN. IF YOU REQUIRE MORE THAN TWO TABLETS FIVE MINUTES APART GO TO THE NEAREST ER VIA EMS. (Patient taking differently: Place 0.4 mg under the tongue every 5 (five) minutes as needed for chest pain.)  . ondansetron (ZOFRAN ODT) 4 MG disintegrating tablet Take 1 tablet (4 mg total) by mouth every 8 (eight) hours as needed for nausea or vomiting.  . pravastatin (PRAVACHOL) 80 MG tablet Take 1 tablet (80 mg total) by mouth every evening.  . sacubitril-valsartan (ENTRESTO) 97-103 MG Take 1 tablet by mouth 2 (two) times daily.  Marland Kitchen spironolactone (ALDACTONE) 25 MG tablet Take 0.5 tablets (12.5 mg total) by mouth daily. (Patient taking differently: Take 25 mg by mouth daily.)  . Vitamin D, Ergocalciferol, (DRISDOL) 1.25 MG (50000 UNIT) CAPS capsule TAKE 1 CAPSULE (50,000 UNITS TOTAL) BY MOUTH EVERY 7 (SEVEN) DAYS     PAST MEDICAL HISTORY: Past Medical History:  Diagnosis Date  . Anxiety   . Coronary artery calcification   . Depression   . Diabetes mellitus without complication (HCC)   . Hyperlipidemia   . Hypertension   . Thyroid disease     PAST SURGICAL HISTORY: Past Surgical History:  Procedure Laterality Date  . BREAST EXCISIONAL BIOPSY Left   . CHOLECYSTECTOMY    . COLON SURGERY     diverticulitis with perforation; s/p colon resection.  . Colonoscopy    . DENTAL SURGERY    . RIGHT/LEFT HEART CATH AND CORONARY ANGIOGRAPHY N/A 01/22/2021   Procedure: RIGHT/LEFT HEART CATH AND CORONARY ANGIOGRAPHY;  Surgeon: Yates Decamp, MD;  Location: [facility];  Service: Cardiovascular;  Laterality: N/A;  . TUBAL LIGATION      FAMILY HISTORY: The patient family history includes Breast cancer in her paternal aunt; Cancer in her mother; Hyperlipidemia in her mother; Other in her father.  SOCIAL HISTORY:  The patient  reports that she has been smoking cigarettes. She has  smoked for the past 38.00 years. She has never used smokeless tobacco. She reports that she does not drink alcohol and does not use drugs.  REVIEW OF SYSTEMS: Review of Systems  Constitutional: Negative for chills and fever.  HENT: Negative for hoarse voice and nosebleeds.   Eyes: Negative for discharge, double vision and pain.  Cardiovascular: Positive for dyspnea on exertion. Negative for chest pain, claudication, leg swelling, near-syncope, orthopnea, palpitations, paroxysmal nocturnal dyspnea and syncope.  Respiratory: Negative for hemoptysis and shortness of breath.   Musculoskeletal: Negative for muscle cramps and myalgias.  Gastrointestinal: Negative for abdominal pain, constipation, diarrhea, hematemesis, hematochezia, melena, nausea and vomiting.  Neurological: Negative for dizziness and light-headedness.    PHYSICAL EXAM: Vitals with BMI 05/06/2021 05/06/2021 05/05/2021  Height - 5\' 0"  5\' 0"   Weight - 167 lbs 6 oz 167 lbs  BMI - 32.69 32.62  Systolic 153 154 356  Diastolic 77 72 76  Pulse 86 94 80    CONSTITUTIONAL:  Well-developed and well-nourished. No acute distress.  SKIN: Skin is warm and dry. No rash noted. No cyanosis. No pallor. No jaundice HEAD: Normocephalic and atraumatic.  EYES: No scleral icterus MOUTH/THROAT: Moist oral membranes.  NECK: No JVD present. No thyromegaly noted. No carotid bruits  LYMPHATIC: No visible cervical adenopathy.  CHEST Normal respiratory effort. No intercostal retractions  LUNGS: Clear to auscultation bilaterally.  No stridor. No wheezes. No rales.  CARDIOVASCULAR: Regular rate and rhythm, positive S1-S2, no murmurs rubs or gallops appreciated. ABDOMINAL: Obese, soft, nontender, nondistended, positive bowel sounds in all 4 quadrants.  No apparent ascites.  EXTREMITIES: No peripheral edema  HEMATOLOGIC: No significant bruising NEUROLOGIC: Oriented to person, place, and time. Nonfocal. Normal muscle tone.  PSYCHIATRIC: Normal mood and  affect. Normal behavior. Cooperative  CARDIAC DATABASE:  EKG:  04/22/2020: Normal sinus rhythm, 77 bpm, left axis deviation, left bundle branch block, left atrial enlargement, poor R wave progression.  Coronary calcium scoring 05/13/2015: LAD: 8, Cx: 2. Total calcium score is 10.   Echocardiogram: 01/20/2021:  1. Left ventricular ejection fraction, by estimation, is 35 to 40%. The left ventricle has moderately decreased function. There is mild left ventricular hypertrophy. Indeterminate diastolic filling due to E-A fusion.  2. Right ventricular systolic function is normal. The right ventricular size is normal.  3. The mitral valve is abnormal. Trivial mitral valve regurgitation.  4. The aortic valve is abnormal. Aortic valve regurgitation is not visualized. Mild to moderate aortic valve sclerosis/calcification is present, without any evidence of aortic stenosis.   Stress Testing: Lexiscan Tetrofosmin Stress Test 05/13/2020:  There is a fixed mild defect in the inferior region due to diaphragmatic attenuation. No ischemia or scar.  The LV is moderately dilated with LV end diastolic volume of 173 mL.  Overall LV systolic function is abnormal without regional wall motion abnormalities. Stress LV EF: 21%.   High risk study due to low LVEF. Findings suggest non ischemic dilated cardiomyopathy.  Heart Catheterization: Right + left heart catheterization 01/22/2021: RA 16/12, mean 11 mmHg, RA saturation 80%. RV 47/8, EDP 15 mmHg. PA 41/26, mean 33 mmHg. PA saturation 77%. PW 32/26, mean 29 mmHg. Aortic saturation 98%. Cardiac output 6.58, cardiac index 3.80 by Fick. Stroke-volume 70 mL. SVR 16.87, PVR 1.05. QP/QS 0.86. LV: Global hypokinesis, EF 35%. EDP markedly elevated at 32 mmHg. There was no pressure gradient across the aortic valve. Left main: Mild disease. LAD: Mild diffuse disease. CX: Mild diffuse disease.  I was told he was 11 something right RI: Mild diffuse  disease. RCA: Dominant, mild diffuse disease.  Impression: Nonischemic cardiomyopathy with severe LV systolic dysfunction. Mild to moderate pulmonary hypertension secondary to diastolic dysfunction with LV EDP elevation.  LABORATORY DATA: CBC Latest Ref Rng & Units 02/21/2021 01/24/2021 01/23/2021  WBC 3.4 - 10.8 x10E3/uL 11.3(H) 15.3(H) 13.9(H)  Hemoglobin 11.1 - 15.9 g/dL 01.7 15.7(H) 15.2(H)  Hematocrit 34.0 - 46.6 % 47.5(H) 44.9 44.3  Platelets 150 - 450 x10E3/uL 194 221 201    CMP Latest Ref Rng & Units 05/05/2021 02/21/2021 02/12/2021  Glucose 70 - 99 mg/dL 510(C) 585(I) 778(EU)  BUN 6 - 23 mg/dL 11 15 12   Creatinine 0.40 - 1.20 mg/dL 2.35) 3.61(W)  Sodium 135 - 145 mEq/L 136 137 131(L)  Potassium 3.5 - 5.1 mEq/L 3.9 5.9(H) 4.6  Chloride 96 - 112 mEq/L 99 91(L) 89(L)  CO2 19 - 32 mEq/L 26 21 19(L)  Calcium 8.4 - 10.5 mg/dL 9.4 9.9 9.5  Total Protein 6.0 -  8.3 g/dL 7.2 - -  Total Bilirubin 0.2 - 1.2 mg/dL 0.5 - -  Alkaline Phos 39 - 117 U/L 82 - -  AST 0 - 37 U/L 16 - -  ALT 0 - 35 U/L 9 - -    Lipid Panel     Component Value Date/Time   CHOL 130 05/05/2021 1558   CHOL 153 12/06/2020 1638   TRIG 118.0 05/05/2021 1558   HDL 44.50 05/05/2021 1558   HDL 46 12/06/2020 1638   CHOLHDL 3 05/05/2021 1558   VLDL 23.6 05/05/2021 1558   LDLCALC 62 05/05/2021 1558   LDLCALC 80 12/06/2020 1638   LABVLDL 27 12/06/2020 1638   Lipid Panel Recent Labs    12/06/20 1638 05/05/21 1558  CHOL 153 130  TRIG 158* 118.0  LDLCALC 80 62  VLDL  --  23.6  HDL 46 44.50  CHOLHDL 3.3 3    Lab Results  Component Value Date   HGBA1C 11.9 (H) 05/05/2021   HGBA1C 6.8 (H) 12/06/2020   HGBA1C 9.1 (A) 09/06/2020   No components found for: NTPROBNP Lab Results  Component Value Date   TSH 0.294 (L) 01/20/2021   TSH 1.160 09/06/2020   TSH 0.732 04/03/2020    IMPRESSION:    ICD-10-CM   1. Chronic combined systolic and diastolic heart failure (HCC)  W23.76 PCV ECHOCARDIOGRAM  COMPLETE  2. Dyspnea on exertion  R06.00   3. Coronary artery calcification seen on computed tomography  I25.10   4. Nonischemic cardiomyopathy (HCC)  I42.8 PCV ECHOCARDIOGRAM COMPLETE  5. Essential hypertension, benign  I10   6. LBBB (left bundle branch block)  I44.7   7. Hypercholesterolemia  E78.00   8. Type 2 diabetes mellitus with hyperglycemia, with long-term current use of insulin (HCC)  E11.65    Z79.4   9. Long-term insulin use (HCC)  Z79.4      RECOMMENDATIONS: KASSIA DEMARINIS is a 65 y.o. female whose past medical history and cardiac risk factors include: insulin-dependent type 2 diabetes with hyperglycemia, pure hypercholesterolemia, hypertriglyceridemia, coronary artery calcification, nonischemic cardiomyopathy, left bundle branch block, essential hypertension, hypothyroidism, tobacco use disorder, vitamin D deficiency, generalized anxiety disorder.  Chronic combined systolic and diastolic heart failure, stage C, NYHA class II: Last hospitalization for congestive heart failure February 2022. Asymptomatic currently. Medications reconciled. Has been on GDMT for 90 days. Will repeat echocardiogram to reevaluate LVEF. Patient will be enrolled into principal care management for congestive heart failure.  Patient's weight has been relatively stable around 165 pounds. Currently being evaluated for sleep apnea. Depending on the results of the echocardiogram may consider CRT therapy in the setting of reduced LVEF, left bundle branch block, and a QRS duration greater than 120 ms.  Nonischemic cardiomyopathy: See plan above  Calcified atherosclerotic coronary artery disease: Continue current medical therapy.  Educated on the importance of risk factor modifications.  Educated on the importance of complete smoking cessation.  We will continue to follow.  Left bundle branch block: Monitor for now.   Non-insulin-dependent diabetes mellitus type 2: Most recent hemoglobin A1c reviewed.   Currently managed per primary team.  Benign essential hypertension: Currently managed by primary provider. Educated on importance of low-salt diet.  Active tobacco use:  Tobacco cessation counseling: Currently smoking 0.25 packs/day   Patient was informed of the dangers of tobacco abuse including stroke, cancer, and MI, as well as benefits of tobacco cessation. Patient is willing to quit at this time. Approximately 5 mins were spent counseling patient cessation  techniques. We discussed various methods to help quit smoking, including deciding on a date to quit, joining a support group, pharmacological agents- nicotine gum/patch/lozenges, chantix.  I will reassess her progress at the next follow-up visit  FINAL MEDICATION LIST END OF ENCOUNTER: No orders of the defined types were placed in this encounter.   Medications Discontinued During This Encounter  Medication Reason  . magnesium oxide (MAG-OX) 400 MG tablet Error  . PAXLOVID 20 x 150 MG & 10 x 100MG  TBPK Error     Current Outpatient Medications:  .  ACCU-CHEK AVIVA PLUS test strip, daily. for testing, Disp: , Rfl: 2 .  albuterol (VENTOLIN HFA) 108 (90 Base) MCG/ACT inhaler, Inhale 1 puff into the lungs every 6 (six) hours as needed for wheezing or shortness of breath., Disp: , Rfl:  .  ALPRAZolam (XANAX) 1 MG tablet, Take 0.5 mg by mouth 3 (three) times daily as needed for anxiety or sleep. , Disp: , Rfl:  .  aspirin 81 MG tablet, Take 81 mg by mouth daily., Disp: , Rfl:  .  BLACK ELDERBERRY PO, Take 10 mLs by mouth daily., Disp: , Rfl:  .  Continuous Blood Gluc Receiver (DEXCOM G6 RECEIVER) DEVI, 1 Device by Does not apply route every morning. Device and supplies, Disp: 1 each, Rfl: 0 .  dapagliflozin propanediol (FARXIGA) 10 MG TABS tablet, Take 1 tablet (10 mg total) by mouth daily., Disp: 30 tablet, Rfl: 3 .  DULoxetine (CYMBALTA) 60 MG capsule, Take 2 capsules (120 mg total) by mouth daily., Disp: 180 capsule, Rfl: 1 .   ezetimibe (ZETIA) 10 MG tablet, TAKE 1 TABLET BY MOUTH EVERY DAY, Disp: 90 tablet, Rfl: 0 .  famotidine (PEPCID) 20 MG tablet, Take 20 mg by mouth daily as needed for heartburn or indigestion., Disp: , Rfl:  .  insulin glargine (LANTUS SOLOSTAR) 100 UNIT/ML Solostar Pen, Inject 42 Units into the skin daily., Disp: 15 mL, Rfl: 5 .  Insulin Pen Needle 31G X 5 MM MISC, Inject 1 each into the skin daily., Disp: 100 each, Rfl: 2 .  levothyroxine (SYNTHROID) 88 MCG tablet, TAKE 1 TABLET (88 MCG TOTAL) BY MOUTH DAILY BEFORE BREAKFAST., Disp: 90 tablet, Rfl: 1 .  magnesium oxide (MAG-OX) 400 (240 Mg) MG tablet, TAKE 0.5 TABLETS (200 MG TOTAL) BY MOUTH 2 (TWO) TIMES DAILY., Disp: 30 tablet, Rfl: 1 .  metoprolol succinate (TOPROL-XL) 100 MG 24 hr tablet, Take 1 tablet (100 mg total) by mouth daily. Take with or immediately following a meal., Disp: 90 tablet, Rfl: 2 .  naproxen sodium (ALEVE) 220 MG tablet, Take 220 mg by mouth daily as needed., Disp: , Rfl:  .  nitroGLYCERIN (NITROSTAT) 0.4 MG SL tablet, PLACE 1 TABLET UNDER THE TONGUE EVERY 5 MINUTES AS NEEDED FOR CHEST PAIN. IF YOU REQUIRE MORE THAN TWO TABLETS FIVE MINUTES APART GO TO THE NEAREST ER VIA EMS. (Patient taking differently: Place 0.4 mg under the tongue every 5 (five) minutes as needed for chest pain.), Disp: 25 tablet, Rfl: 1 .  ondansetron (ZOFRAN ODT) 4 MG disintegrating tablet, Take 1 tablet (4 mg total) by mouth every 8 (eight) hours as needed for nausea or vomiting., Disp: 20 tablet, Rfl: 1 .  pravastatin (PRAVACHOL) 80 MG tablet, Take 1 tablet (80 mg total) by mouth every evening., Disp: 90 tablet, Rfl: 1 .  sacubitril-valsartan (ENTRESTO) 97-103 MG, Take 1 tablet by mouth 2 (two) times daily., Disp: 60 tablet, Rfl: 3 .  spironolactone (ALDACTONE) 25 MG tablet,  Take 0.5 tablets (12.5 mg total) by mouth daily. (Patient taking differently: Take 25 mg by mouth daily.), Disp: 30 tablet, Rfl: 3 .  Vitamin D, Ergocalciferol, (DRISDOL) 1.25 MG  (50000 UNIT) CAPS capsule, TAKE 1 CAPSULE (50,000 UNITS TOTAL) BY MOUTH EVERY 7 (SEVEN) DAYS, Disp: 15 capsule, Rfl: 0  Orders Placed This Encounter  Procedures  . PCV ECHOCARDIOGRAM COMPLETE   --Continue cardiac medications as reconciled in final medication list. --Return in about 3 months (around 08/06/2021) for heart failure management.. Or sooner if needed. --Continue follow-up with your primary care physician regarding the management of your other chronic comorbid conditions.  Patient's questions and concerns were addressed to her satisfaction. She voices understanding of the instructions provided during this encounter.   This note was created using a voice recognition software as a result there may be grammatical errors inadvertently enclosed that do not reflect the nature of this encounter. Every attempt is made to correct such errors.  Tessa Lerner, [place], [facility]  Pager: [phone] Office: [phone]

## 2021-05-08 ENCOUNTER — Encounter: Payer: Self-pay | Admitting: Registered Nurse

## 2021-05-08 ENCOUNTER — Telehealth (INDEPENDENT_AMBULATORY_CARE_PROVIDER_SITE_OTHER): Payer: Medicare Other | Admitting: Registered Nurse

## 2021-05-08 ENCOUNTER — Other Ambulatory Visit: Payer: Self-pay

## 2021-05-08 DIAGNOSIS — J988 Other specified respiratory disorders: Secondary | ICD-10-CM | POA: Diagnosis not present

## 2021-05-08 DIAGNOSIS — R059 Cough, unspecified: Secondary | ICD-10-CM

## 2021-05-08 MED ORDER — QVAR 40 MCG/ACT IN AERS: 1.0000 | INHALATION_SPRAY | Freq: Every day | RESPIRATORY_TRACT | 12 refills | Status: DC

## 2021-05-08 MED ORDER — AMOXICILLIN 875 MG PO TABS: 875.0000 mg | ORAL_TABLET | Freq: Two times a day (BID) | ORAL | 0 refills | Status: AC

## 2021-05-08 NOTE — Patient Instructions (Signed)
° ° ° °  If you have lab work done today you will be contacted with your lab results within the next 2 weeks.  If you have not heard from us then please contact us. The fastest way to get your results is to register for My Chart. ° ° °IF you received an x-ray today, you will receive an invoice from [facility]. Please contact [facility] at [phone] with questions or concerns regarding your invoice.  ° °IF you received labwork today, you will receive an invoice from [facility]. Please contact [facility] at [phone] with questions or concerns regarding your invoice.  ° °Our billing staff will not be able to assist you with questions regarding bills from these companies. ° °You will be contacted with the lab results as soon as they are available. The fastest way to get your results is to activate your My Chart account. Instructions are located on the last page of this paperwork. If you have not heard from us regarding the results in 2 weeks, please contact this office. °  ° ° ° °

## 2021-05-08 NOTE — Progress Notes (Signed)
Telemedicine Encounter- SOAP NOTE Established Patient  This telephone encounter was conducted with the patient's (or proxy's) verbal consent via audio telecommunications: yes  Patient was instructed to have this encounter in a suitably private space; and to only have persons present to whom they give permission to participate. In addition, patient identity was confirmed by use of name plus two identifiers (DOB and address).  I discussed the limitations, risks, security and privacy concerns of performing an evaluation and management service by telephone and the availability of in person appointments. I also discussed with the patient that there may be a patient responsible charge related to this service. The patient expressed understanding and agreed to proceed.  I spent a total of TIME; 0 MIN TO 60 MIN: 15 minutes talking with the patient or their proxy.  Patient at home Provider in office  Participants: Jari Sportsman, NP and Ivan Croft  Chief Complaint  Patient presents with   Nasal Congestion    Patient states she has some chest congestion and a cough for about 3 days. Patient states she has been exposed to a few people who has been sick but all took covid test and they were negative.    Subjective   Felicia Hanson is a 65 y.o. established patient. Telephone visit today for nasal and chest congestion  HPI Onset around 3 days ago Notes that family members have had sinus infections recently Pt did have course of COVID in early May - was tx with antivirals Symptoms: dry cough - scant mucus production. Some sore throat and PND with nasal congestion. Cough is improving, sore throat resolved. Nasal congestion ongoing.  Low grade temp - 99.5 Wheezing a bit Has taken zyrtec and flonase with no effect  Patient Active Problem List   Diagnosis Date Noted   Loud snoring 03/24/2021   Excessive daytime sleepiness 03/24/2021   Acute on chronic combined systolic and diastolic HF (heart  failure) (HCC) 03/24/2021   Panlobular emphysema (HCC) 03/24/2021   GAD (generalized anxiety disorder) 03/24/2021   Atypical chest pain 01/22/2021   Respiratory distress 01/20/2021   Acute on chronic combined systolic and diastolic CHF (congestive heart failure) (HCC) 01/20/2021   Type 2 diabetes mellitus without complication, without long-term current use of insulin (HCC) 10/26/2017   Generalized anxiety disorder 01/10/2014   Vitamin D deficiency 01/10/2014   Essential hypertension, benign 01/10/2014   Pure hypercholesterolemia 01/10/2014   Hypothyroidism 01/10/2014    Past Medical History:  Diagnosis Date   Anxiety    Coronary artery calcification    Depression    Diabetes mellitus without complication (HCC)    Hyperlipidemia    Hypertension    Thyroid disease     Current Outpatient Medications  Medication Sig Dispense Refill   ACCU-CHEK AVIVA PLUS test strip daily. for testing  2   albuterol (VENTOLIN HFA) 108 (90 Base) MCG/ACT inhaler Inhale 1 puff into the lungs every 6 (six) hours as needed for wheezing or shortness of breath.     ALPRAZolam (XANAX) 1 MG tablet Take 0.5 mg by mouth 3 (three) times daily as needed for anxiety or sleep.      amoxicillin (AMOXIL) 875 MG tablet Take 1 tablet (875 mg total) by mouth 2 (two) times daily for 7 days. 14 tablet 0   aspirin 81 MG tablet Take 81 mg by mouth daily.     beclomethasone (QVAR) 40 MCG/ACT inhaler Inhale 1 puff into the lungs daily. 1 each 12   BLACK ELDERBERRY  PO Take 10 mLs by mouth daily.     Continuous Blood Gluc Receiver (DEXCOM G6 RECEIVER) DEVI 1 Device by Does not apply route every morning. Device and supplies 1 each 0   dapagliflozin propanediol (FARXIGA) 10 MG TABS tablet Take 1 tablet (10 mg total) by mouth daily. 30 tablet 3   DULoxetine (CYMBALTA) 60 MG capsule Take 2 capsules (120 mg total) by mouth daily. 180 capsule 1   ezetimibe (ZETIA) 10 MG tablet TAKE 1 TABLET BY MOUTH EVERY DAY 90 tablet 0   famotidine  (PEPCID) 20 MG tablet Take 20 mg by mouth daily as needed for heartburn or indigestion.     insulin glargine (LANTUS SOLOSTAR) 100 UNIT/ML Solostar Pen Inject 42 Units into the skin daily. 15 mL 5   Insulin Pen Needle 31G X 5 MM MISC Inject 1 each into the skin daily. 100 each 2   levothyroxine (SYNTHROID) 88 MCG tablet TAKE 1 TABLET (88 MCG TOTAL) BY MOUTH DAILY BEFORE BREAKFAST. 90 tablet 1   magnesium oxide (MAG-OX) 400 (240 Mg) MG tablet TAKE 0.5 TABLETS (200 MG TOTAL) BY MOUTH 2 (TWO) TIMES DAILY. 30 tablet 1   metoprolol succinate (TOPROL-XL) 100 MG 24 hr tablet Take 1 tablet (100 mg total) by mouth daily. Take with or immediately following a meal. 90 tablet 2   naproxen sodium (ALEVE) 220 MG tablet Take 220 mg by mouth daily as needed.     nitroGLYCERIN (NITROSTAT) 0.4 MG SL tablet PLACE 1 TABLET UNDER THE TONGUE EVERY 5 MINUTES AS NEEDED FOR CHEST PAIN. IF YOU REQUIRE MORE THAN TWO TABLETS FIVE MINUTES APART GO TO THE NEAREST ER VIA EMS. (Patient taking differently: Place 0.4 mg under the tongue every 5 (five) minutes as needed for chest pain.) 25 tablet 1   ondansetron (ZOFRAN ODT) 4 MG disintegrating tablet Take 1 tablet (4 mg total) by mouth every 8 (eight) hours as needed for nausea or vomiting. 20 tablet 1   pravastatin (PRAVACHOL) 80 MG tablet Take 1 tablet (80 mg total) by mouth every evening. 90 tablet 1   sacubitril-valsartan (ENTRESTO) 97-103 MG Take 1 tablet by mouth 2 (two) times daily. 60 tablet 3   spironolactone (ALDACTONE) 25 MG tablet Take 0.5 tablets (12.5 mg total) by mouth daily. (Patient taking differently: Take 25 mg by mouth daily.) 30 tablet 3   Vitamin D, Ergocalciferol, (DRISDOL) 1.25 MG (50000 UNIT) CAPS capsule TAKE 1 CAPSULE (50,000 UNITS TOTAL) BY MOUTH EVERY 7 (SEVEN) DAYS 15 capsule 0   No current facility-administered medications for this visit.    Allergies  Allergen Reactions   Metformin And Related Anaphylaxis    Could not swallow   Nicotine Rash     Only allergic to nicotine patch   Ace Inhibitors Cough    Cough with lisinopril.     Social History   Socioeconomic History   Marital status: Married    Spouse name: Not on file   Number of children: 2   Years of education: some college   Highest education level: Not on file  Occupational History   Occupation: Retired  Tobacco Use   Smoking status: Every Day    Years: 38.00    Pack years: 0.00    Types: Cigarettes   Smokeless tobacco: Never   Tobacco comments:    2-5 A DAY  Vaping Use   Vaping Use: Never used  Substance and Sexual Activity   Alcohol use: No    Alcohol/week: 0.0 standard drinks  Drug use: No   Sexual activity: Not Currently  Other Topics Concern   Not on file  Social History Narrative   Marital status: married      Children:  2 children; 4 grandchildren      Lives: with husband, 2 granddaughters      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   Left-handed.   Caffeine use: 2.5 cups caffeine per day.   Social Determinants of Health   Financial Resource Strain: Low Risk    Difficulty of Paying Living Expenses: Not very hard  Food Insecurity: No Food Insecurity   Worried About Programme researcher, broadcasting/film/video in the Last Year: Never true   Ran Out of Food in the Last Year: Never true  Transportation Needs: No Transportation Needs   Lack of Transportation (Medical): No   Lack of Transportation (Non-Medical): No  Physical Activity: Not on file  Stress: Not on file  Social Connections: Not on file  Intimate Partner Violence: Not on file    ROS Per hpi, otherwise negative  Objective   Vitals as reported by the patient: There were no vitals filed for this visit.  Fruma was seen today for nasal congestion.  Diagnoses and all orders for this visit:  Cough -     amoxicillin (AMOXIL) 875 MG tablet; Take 1 tablet (875 mg total) by mouth 2 (two) times daily for 7 days. -     beclomethasone (QVAR) 40  MCG/ACT inhaler; Inhale 1 puff into the lungs daily.  Respiratory infection -     amoxicillin (AMOXIL) 875 MG tablet; Take 1 tablet (875 mg total) by mouth 2 (two) times daily for 7 days. -     beclomethasone (QVAR) 40 MCG/ACT inhaler; Inhale 1 puff into the lungs daily.   PLAN Doubt covid. Favor bacterial sinusitis. Will treat with amoxicillin Given flovent for ongoing breathing concerns / chest congestion concerns. Return to clinic and ER precautions reviewed Patient encouraged to call clinic with any questions, comments, or concerns.  I discussed the assessment and treatment plan with the patient. The patient was provided an opportunity to ask questions and all were answered. The patient agreed with the plan and demonstrated an understanding of the instructions.   The patient was advised to call back or seek an in-person evaluation if the symptoms worsen or if the condition fails to improve as anticipated.  I provided 15 minutes of non-face-to-face time during this encounter.  Janeece Agee, NP  Primary Care at [facility]

## 2021-05-09 DIAGNOSIS — R0602 Shortness of breath: Secondary | ICD-10-CM | POA: Insufficient documentation

## 2021-05-09 NOTE — Procedures (Signed)
PATIENT'S NAME:  Felicia, Hanson DOB:      1956-02-08      MR#:    [id number]     DATE OF RECORDING: 05/01/2021 REFERRING M.D.:  Tessa Lerner, DO, [facility] Study Performed:   Baseline Polysomnogram ( did not qualify for SPLIT night protocol)  HISTORY:  This patient is a 65 year old female who had established care with cardiovascular services on 04-22-2020.  She was seen after a hospitalization for congestive heart failure.  She has a medical history of insulin-dependent type 2 diabetes with hyperglycemia under poor control, hypercholesterolemia, hypertriglyceridemia, coronary artery disease with calcifications and silent MI, cardiomyopathy, left bundle branch block, essential hypertension, hypothyroidism and ongoing tobacco use disorder with COPD, vitamin D deficiency and a history of generalized anxiety disorder, now treated with Xanax by psychiatry.  On 21 January 2021 she presented with a chief complaint of shortness of breath after having fainted at home her son who lives in the neighborhood, did call EMS after her husband did not.  She was found in severe respiratory distress with hypoxemia and required BiPAP intervention in hospital. Her diagnosis was acute on chronic combined systolic and diastolic heart failure.   The patient endorsed the Epworth Sleepiness Scale at 15 points with daily naps. FSS at 33/63 The patient's weight 167 pounds with a height of 60 (inches), resulting in a BMI of 32.9 kg/m2. The patient's neck circumference measured 16 inches.  CURRENT MEDICATIONS: Ventolin, Xanax, Aspirin, Elderberry, Farxiga, Cymbalta, Zetia, Pepcid, Lantus Solostar, Synthroid, Mag-Ox, Toprol-XL, Aleve, Nitrostat, Zofran, Klor-Con, Entresto, Aldactone, Drisdol, Pravachol   PROCEDURE:  This is a multichannel digital polysomnogram utilizing the Somnostar 11.2 system.  Electrodes and sensors were applied and monitored per AASM Specifications.   EEG, EOG, Chin and Limb EMG, were  sampled at 200 Hz.  ECG, Snore and Nasal Pressure, Thermal Airflow, Respiratory Effort, CPAP Flow and Pressure, Oximetry was sampled at 50 Hz. Digital video and audio were recorded.      BASELINE STUDY: Lights Out was at 21:58 and Lights On at 05:29.  Total recording time (TRT) was 451 minutes, with a total sleep time (TST) of 380.5 minutes.   The patient's sleep latency was 26 minutes.  REM latency was 226.5 minutes.  The sleep efficiency was 84.4 %.     SLEEP ARCHITECTURE: WASO (Wake after sleep onset) was 47 minutes.  There were 10 minutes in Stage N1, 304 minutes Stage N2, 37 minutes Stage N3 and 29.5 minutes in Stage REM.  The percentage of Stage N1 was 2.6%, Stage N2 was 79.9%, Stage N3 was 9.7% and Stage R (REM sleep) was 7.8%.   RESPIRATORY ANALYSIS:  There were a total of 73 respiratory events:  42 obstructive apneas, 0 central apneas and 4 mixed apneas with a total of 46 apneas and additional 27 hypopneas. The patient also had several respiratory event related arousals (RERAs) due to moderate snoring. Some coughing related arousals were also recorded.      The total APNEA/HYPOPNEA INDEX (AHI) was 11.5/hour and the total RESPIRATORY DISTURBANCE INDEX was  13.5 /hour.  22 events occurred in REM sleep and 24 events in NREM. The REM AHI was  44.7 /hour, versus a non-REM AHI of 8.7.   The patient spent 55 minutes of total sleep time in the supine position and 326 minutes in non-supine. The supine AHI was 53.5 versus a non-supine AHI of 4.5.  OXYGEN SATURATION & C02:  The Wake baseline 02 saturation was 92%, with the lowest  being 78%. Time spent below 89% saturation equaled 22 minutes. The arousals were noted as: 34 were spontaneous, 0 were associated with PLMs, 39 were associated with respiratory events. The patient had a total of 0 Periodic Limb Movements.   Audio and video analysis did not show any abnormal or unusual movements, behaviors, phonations or vocalizations.  Loud to Moderate Snoring  was noted. Apnea and hypopnea clustered in REM sleep.  EKG was showing PVCs,   IMPRESSION:  Obstructive Sleep Apnea (OSA) at a mild degree but associated with hypoxemia and strongly REM sleep dependent. A screen shot of cyclic breathing documentation was attached.  Hypoxemia was also associated with REM sleep. Snoring was present.  PVCs / otherwise normal EKG   RECOMMENDATIONS:  Advise either full-night, attended, CPAP titration study to optimize therapy and allow possible use of oxygen , or home start on auto CPAP, 5-15 cm water pressure window with 2cm EPR, heated humidification, and mask of this patients choice and comfort. .     I certify that I have reviewed the entire raw data recording prior to the issuance of this report in accordance with the Standards of Accreditation of the American Academy of Sleep Medicine (AASM)    Melvyn Novas, MD Diplomat, American Board of Psychiatry and Neurology  Diplomat, American Board of Sleep Medicine Wellsite geologist, [facility] [employer]

## 2021-05-09 NOTE — Progress Notes (Signed)
Patient had mild apnea and did not qualify for a split night protocol with same night application of CPAP.   IMPRESSION:  1. Obstructive Sleep Apnea (OSA) at a mild degree ( (AHI) was 11.5/hour) but associated with hypoxemia and strongly REM sleep dependent ( REM AHI was  44.7). A screen shot of cyclic breathing documentation was attached.  2. Hypoxemia was also associated with REM sleep. Time spent below 89% saturation equaled 22 minutes. Nadir of SpO2 was 78%. 3. Snoring was present.  4. PVCs / otherwise normal EKG   RECOMMENDATIONS:  1. Advise either full night, attended, CPAP titration study to optimize therapy and allow for possible use of oxygen,  Or: Plan B - home start on auto titrating CPAP device , with 5-15 cm water pressure window and with 2cm EPR, heated humidification, and mask of this patient's choice and comfort. 2. SMOKING cessation is urgently needed. 3. Sleeping with an elevated top of the bed may reduce some coughing and GERD episodes.

## 2021-05-09 NOTE — Addendum Note (Signed)
Addended by: Melvyn Novas on: 05/09/2021 03:34 PM   Modules accepted: Orders

## 2021-05-13 ENCOUNTER — Other Ambulatory Visit: Payer: Self-pay | Admitting: Family Medicine

## 2021-05-13 ENCOUNTER — Telehealth: Payer: Self-pay | Admitting: Neurology

## 2021-05-13 DIAGNOSIS — E875 Hyperkalemia: Secondary | ICD-10-CM

## 2021-05-13 NOTE — Telephone Encounter (Signed)
I called pt. I advised pt that Dr. Vickey Huger reviewed their sleep study results and found that has mild sleep apnea and recommends that pt be treated with a cpap. Dr. Vickey Huger  recommends that pt return for a repeat sleep study in order to properly titrate the cpap and ensure a good mask fit. Pt is agreeable to returning for a titration study. I advised pt that our sleep lab will file with pt's insurance and call pt to schedule the sleep study when we hear back from the pt's insurance regarding coverage of this sleep study. Pt verbalized understanding of results. Pt had no questions at this time but was encouraged to call back if questions arise.  Advised the pt if cpap titration is not covered then I will call with plan B.

## 2021-05-13 NOTE — Telephone Encounter (Signed)
-----   Message from Melvyn Novas, MD sent at 05/09/2021  3:32 PM EDT ----- Patient had mild apnea and did not qualify for a split night protocol with same night application of CPAP.   IMPRESSION:  1. Obstructive Sleep Apnea (OSA) at a mild degree ( (AHI) was 11.5/hour) but associated with hypoxemia and strongly REM sleep dependent ( REM AHI was  44.7). A screen shot of cyclic breathing documentation was attached.  2. Hypoxemia was also associated with REM sleep. Time spent below 89% saturation equaled 22 minutes. Nadir of SpO2 was 78%. 3. Snoring was present.  4. PVCs / otherwise normal EKG   RECOMMENDATIONS:  1. Advise either full night, attended, CPAP titration study to optimize therapy and allow for possible use of oxygen,  Or: Plan B - home start on auto titrating CPAP device , with 5-15 cm water pressure window and with 2cm EPR, heated humidification, and mask of this patient's choice and comfort. 2. SMOKING cessation is urgently needed. 3. Sleeping with an elevated top of the bed may reduce some coughing and GERD episodes.

## 2021-05-13 NOTE — Telephone Encounter (Signed)
Attempted to call the patient to review SSR. Called cell first. Unable to LVM due to no room on mailbox. Called the home number and it was a busy tone and would not ring through to the patient. Will try again.

## 2021-05-13 NOTE — Telephone Encounter (Signed)
Pt returned phone call, would like a call back.  

## 2021-05-14 ENCOUNTER — Other Ambulatory Visit: Payer: Self-pay | Admitting: Neurology

## 2021-05-14 DIAGNOSIS — R0683 Snoring: Secondary | ICD-10-CM

## 2021-05-14 DIAGNOSIS — G4733 Obstructive sleep apnea (adult) (pediatric): Secondary | ICD-10-CM

## 2021-05-14 DIAGNOSIS — G4719 Other hypersomnia: Secondary | ICD-10-CM

## 2021-05-14 NOTE — Telephone Encounter (Signed)
I called the patient and informed her that we would proceed forward with a auto CPAP machine.  I reviewed PAP compliance expectations with the pt. Pt is agreeable to starting a CPAP. I advised pt that an order will be sent to a DME, [facility] and [facility] will call the pt within about one week after they file with the pt's insurance. [facility]) will show the pt how to use the machine, fit for masks, and troubleshoot the CPAP if needed. A follow up appt was made for insurance purposes with Dr. Vickey Huger on Sept 28,2022 at 3:30 pm. Pt verbalized understanding to arrive 15 minutes early and bring their CPAP. A letter with all of this information in it will be mailed to the pt as a reminder. I verified with the pt that the address we have on file is correct. Pt verbalized understanding of results. Pt had no questions at this time but was encouraged to call back if questions arise. I have sent the order to [facility])  and have received confirmation that they have received the order.

## 2021-05-15 ENCOUNTER — Other Ambulatory Visit: Payer: Self-pay

## 2021-05-15 DIAGNOSIS — I428 Other cardiomyopathies: Secondary | ICD-10-CM

## 2021-05-15 DIAGNOSIS — I5042 Chronic combined systolic (congestive) and diastolic (congestive) heart failure: Secondary | ICD-10-CM

## 2021-05-20 ENCOUNTER — Other Ambulatory Visit: Payer: Medicare Other

## 2021-05-21 ENCOUNTER — Encounter: Payer: Self-pay | Admitting: Family Medicine

## 2021-05-21 ENCOUNTER — Other Ambulatory Visit: Payer: Self-pay

## 2021-05-21 ENCOUNTER — Ambulatory Visit (INDEPENDENT_AMBULATORY_CARE_PROVIDER_SITE_OTHER): Payer: Medicare Other | Admitting: Family Medicine

## 2021-05-21 VITALS — BP 130/78 | HR 72 | Temp 97.5°F | Resp 16 | Ht 60.0 in | Wt 170.2 lb

## 2021-05-21 DIAGNOSIS — E1165 Type 2 diabetes mellitus with hyperglycemia: Secondary | ICD-10-CM | POA: Diagnosis not present

## 2021-05-21 DIAGNOSIS — R059 Cough, unspecified: Secondary | ICD-10-CM

## 2021-05-21 DIAGNOSIS — M7989 Other specified soft tissue disorders: Secondary | ICD-10-CM

## 2021-05-21 LAB — MICROALBUMIN / CREATININE URINE RATIO
Creatinine,U: 32.6 mg/dL
Microalb Creat Ratio: 58.9 mg/g — ABNORMAL HIGH (ref 0.0–30.0)
Microalb, Ur: 19.2 mg/dL — ABNORMAL HIGH (ref 0.0–1.9)

## 2021-05-21 MED ORDER — FLUTICASONE PROPIONATE HFA 44 MCG/ACT IN AERO: 1.0000 | INHALATION_SPRAY | Freq: Two times a day (BID) | RESPIRATORY_TRACT | 1 refills | Status: DC | PRN

## 2021-05-21 NOTE — Progress Notes (Signed)
Subjective:  Patient ID: Felicia Hanson, female    DOB: 1955-12-07  Age: 65 y.o. MRN: [id number]  CC:  Chief Complaint  Patient presents with   Diabetes    Pt was to follow up give abnormal glucose readings, review lab work today    Cough    Pt reports discussed last visit and medication had helped but has some congestion and cough left still.     HPI Felicia Hanson presents for   Diabetes: Complicated by microalbuminuria, hyperglycemia with worsening control at her June 6th visit, some increase in creatinine at that time as well. Treated with Lantus, [place].  42 units/day for the previous week when I saw her in June, home readings 150s to 290s at that time.  Glucose 167 in the office last visit. She is on statin and ARB with Entresto. Denies  missed doses of insulin or farxiga. past few months Still on 42 units lantus.  Fasting readings:  usually 160-253. Lowest 167. Highest 513 - few months ago. Inly up to 263 past month. Some decreased foord intake with vaoiding pasta/carbs. Weight stable. Drinking fluids.   Microalbumin: Elevated ratio in May 2021.  Lab Results  Component Value Date   HGBA1C 11.9 (H) 05/05/2021   HGBA1C 6.8 (H) 12/06/2020   HGBA1C 9.1 (A) 09/06/2020   Lab Results  Component Value Date   MICROALBUR 19.2 (H) 05/21/2021   LDLCALC 62 05/05/2021   CREATININE 0.97 05/05/2021   Hyperkalemia: Elevated in March, normal at last visit.  Cough Discussed that her last visit on June 6.  Prior CHF exacerbation in March, with last chest x-ray March 25 indicating resolved bilateral lung opacities, likely resolved pulmonary edema, no acute findings and mild peribronchial thickening which was thought to be chronic.  Unlikely COVID infection as she just had infection in May, minimal symptoms.  Possible viral illness from other family member versus component of allergies.  Trial of Zyrtec plus or minus Flonase, repeat COVID testing as option.   Using zyrtec and flonase ns  - no further drainage.  Recent updated echo on 6/16 - results pending.  Ultrasound ordered for leg swelling last visit - has not had performed. Leg swelling has improved some. More energy, no chest pain. Feels better with walking. R knee has swollen at times in past. No calf pain.  Video visit 05/08/21 with Kathrin Ruddy. Rx amoxicillin, beclomethasone inhaler.   Cough has improved after abx. still some congestion in chest, yellow sputum at times when lying down at night. Cough worse lying down. Some wheezing with cough only.  Has not started Qvar - not filled.    History Patient Active Problem List   Diagnosis Date Noted   Shortness of breath at rest 05/09/2021   Loud snoring 03/24/2021   Excessive daytime sleepiness 03/24/2021   Acute on chronic combined systolic and diastolic HF (heart failure) ([place]) 03/24/2021   Panlobular emphysema ([place]) 03/24/2021   GAD (generalized anxiety disorder) 03/24/2021   Atypical chest pain 01/22/2021   Respiratory distress 01/20/2021   Acute on chronic combined systolic and diastolic CHF (congestive heart failure) ([place]) 01/20/2021   Type 2 diabetes mellitus without complication, without long-term current use of insulin ([place]) 10/26/2017   Generalized anxiety disorder 01/10/2014   Vitamin D deficiency 01/10/2014   Essential hypertension, benign 01/10/2014   Pure hypercholesterolemia 01/10/2014   Hypothyroidism 01/10/2014   Past Medical History:  Diagnosis Date   Anxiety    Coronary artery calcification    Depression  Diabetes mellitus without complication ([place])    Hyperlipidemia    Hypertension    Thyroid disease    Past Surgical History:  Procedure Laterality Date   BREAST EXCISIONAL BIOPSY Left    CHOLECYSTECTOMY     COLON SURGERY     diverticulitis with perforation; s/p colon resection.   Colonoscopy     DENTAL SURGERY     RIGHT/LEFT HEART CATH AND CORONARY ANGIOGRAPHY N/A 01/22/2021   Procedure: RIGHT/LEFT HEART CATH AND CORONARY  ANGIOGRAPHY;  Surgeon: Adrian Prows, MD;  Location: [facility];  Service: Cardiovascular;  Laterality: N/A;   TUBAL LIGATION     Allergies  Allergen Reactions   Metformin And Related Anaphylaxis    Could not swallow   Nicotine Rash    Only allergic to nicotine patch   Ace Inhibitors Cough    Cough with lisinopril.    Prior to Admission medications   Medication Sig Start Date End Date Taking? Authorizing Provider  ACCU-CHEK AVIVA PLUS test strip daily. for testing 03/11/18  Yes [provider]  albuterol (VENTOLIN HFA) 108 (90 Base) MCG/ACT inhaler Inhale 1 puff into the lungs every 6 (six) hours as needed for wheezing or shortness of breath.   Yes [provider]  ALPRAZolam Duanne Moron) 1 MG tablet Take 0.5 mg by mouth 3 (three) times daily as needed for anxiety or sleep.  08/21/13  Yes [provider]  aspirin 81 MG tablet Take 81 mg by mouth daily.   Yes [provider]  beclomethasone (QVAR) 40 MCG/ACT inhaler Inhale 1 puff into the lungs daily. 05/08/21  Yes Maximiano Coss, NP  BLACK ELDERBERRY PO Take 10 mLs by mouth daily.   Yes [provider]  Continuous Blood Gluc Receiver (DEXCOM G6 RECEIVER) DEVI 1 Device by Does not apply route every morning. Device and supplies 04/18/20  Yes Wendie Agreste, MD  dapagliflozin propanediol (FARXIGA) 10 MG TABS tablet Take 1 tablet (10 mg total) by mouth daily. 01/25/21  Yes Regalado, Belkys A, MD  DULoxetine (CYMBALTA) 60 MG capsule Take 2 capsules (120 mg total) by mouth daily. 12/06/20  Yes Wendie Agreste, MD  ezetimibe (ZETIA) 10 MG tablet TAKE 1 TABLET BY MOUTH EVERY DAY 04/23/21  Yes Wendie Agreste, MD  famotidine (PEPCID) 20 MG tablet Take 20 mg by mouth daily as needed for heartburn or indigestion.   Yes [provider]  insulin glargine (LANTUS SOLOSTAR) 100 UNIT/ML Solostar Pen Inject 42 Units into the skin daily. 05/05/21  Yes Wendie Agreste, MD  Insulin Pen Needle 31G X 5 MM MISC  Inject 1 each into the skin daily. 02/10/21  Yes Wendie Agreste, MD  levothyroxine (SYNTHROID) 88 MCG tablet TAKE 1 TABLET (88 MCG TOTAL) BY MOUTH DAILY BEFORE BREAKFAST. 01/19/21  Yes Wendie Agreste, MD  magnesium oxide (MAG-OX) 400 (240 Mg) MG tablet TAKE 0.5 TABLETS (200 MG TOTAL) BY MOUTH 2 (TWO) TIMES DAILY. 04/29/21  Yes Tolia, Sunit, DO  metoprolol succinate (TOPROL-XL) 100 MG 24 hr tablet Take 1 tablet (100 mg total) by mouth daily. Take with or immediately following a meal. 02/25/21  Yes Tolia, Sunit, DO  naproxen sodium (ALEVE) 220 MG tablet Take 220 mg by mouth daily as needed.   Yes [provider]  nitroGLYCERIN (NITROSTAT) 0.4 MG SL tablet PLACE 1 TABLET UNDER THE TONGUE EVERY 5 MINUTES AS NEEDED FOR CHEST PAIN. IF YOU REQUIRE MORE THAN TWO TABLETS FIVE MINUTES APART GO TO THE NEAREST ER VIA  EMS. Patient taking differently: Place 0.4 mg under the tongue every 5 (five) minutes as needed for chest pain. 07/29/20  Yes Tolia, Sunit, DO  ondansetron (ZOFRAN ODT) 4 MG disintegrating tablet Take 1 tablet (4 mg total) by mouth every 8 (eight) hours as needed for nausea or vomiting. 07/14/19  Yes Stallings, Zoe A, MD  pravastatin (PRAVACHOL) 80 MG tablet Take 1 tablet (80 mg total) by mouth every evening. 05/05/21 08/03/21 Yes Wendie Agreste, MD  sacubitril-valsartan (ENTRESTO) 97-103 MG Take 1 tablet by mouth 2 (two) times daily. 03/07/21  Yes Tolia, Sunit, DO  spironolactone (ALDACTONE) 25 MG tablet Take 0.5 tablets (12.5 mg total) by mouth daily. Patient taking differently: Take 25 mg by mouth daily. 01/29/21  Yes Katherine Roan, MD  Vitamin D, Ergocalciferol, (DRISDOL) 1.25 MG (50000 UNIT) CAPS capsule TAKE 1 CAPSULE (50,000 UNITS TOTAL) BY MOUTH EVERY 7 (SEVEN) DAYS 04/02/21  Yes Wendie Agreste, MD   Social History   Socioeconomic History   Marital status: Married    Spouse name: Not on file   Number of children: 2   Years of education: some college   Highest education level:  Not on file  Occupational History   Occupation: Retired  Tobacco Use   Smoking status: Every Day    Years: 38.00    Pack years: 0.00    Types: Cigarettes   Smokeless tobacco: Never   Tobacco comments:    2-5 A DAY  Vaping Use   Vaping Use: Never used  Substance and Sexual Activity   Alcohol use: No    Alcohol/week: 0.0 standard drinks   Drug use: No   Sexual activity: Not Currently  Other Topics Concern   Not on file  Social History Narrative   Marital status: married      Children:  2 children; 4 grandchildren      Lives: with husband, 2 granddaughters      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   Left-handed.   Caffeine use: 2.5 cups caffeine per day.   Social Determinants of Health   Financial Resource Strain: Low Risk    Difficulty of Paying Living Expenses: Not very hard  Food Insecurity: No Food Insecurity   Worried About Charity fundraiser in the Last Year: Never true   Ran Out of Food in the Last Year: Never true  Transportation Needs: No Transportation Needs   Lack of Transportation (Medical): No   Lack of Transportation (Non-Medical): No  Physical Activity: Not on file  Stress: Not on file  Social Connections: Not on file  Intimate Partner Violence: Not on file    Review of Systems Per HPI.   Objective:   Vitals:   05/21/21 1111  BP: 130/78  Pulse: 72  Resp: 16  Temp: (!) 97.5 F (36.4 C)  TempSrc: Temporal  SpO2: 97%  Weight: 170 lb 3.2 oz (77.2 kg)  Height: 5' (1.524 m)     Physical Exam Vitals reviewed.  Constitutional:      Appearance: Normal appearance. She is well-developed.  HENT:     Head: Normocephalic and atraumatic.  Eyes:     Conjunctiva/sclera: Conjunctivae normal.     Pupils: Pupils are equal, round, and reactive to light.  Neck:     Vascular: No carotid bruit.  Cardiovascular:     Rate and Rhythm: Normal rate and regular rhythm.  Heart sounds: Normal  heart sounds.  Pulmonary:     Effort: Pulmonary effort is normal. No respiratory distress.     Breath sounds: Normal breath sounds. No stridor. No wheezing or rhonchi.  Abdominal:     Palpations: Abdomen is soft. There is no pulsatile mass.     Tenderness: There is no abdominal tenderness.  Musculoskeletal:     Right lower leg: No edema (slight increased edema LLE vs right. calf nontender, no cords, negative homans.).     Left lower leg: No edema.  Skin:    General: Skin is warm and dry.  Neurological:     Mental Status: She is alert and oriented to person, place, and time.  Psychiatric:        Mood and Affect: Mood normal.        Behavior: Behavior normal.    Assessment & Plan:  Felicia Hanson is a 65 y.o. female . Uncontrolled type 2 diabetes mellitus with hyperglycemia ([place]) - Plan: Microalbumin / creatinine urine ratio  -Uncontrolled by last A1c was still some variability in readings as above.  We will have her increase her Lantus by 2 units every 3 days until readings remain below 200.  Hypoglycemia precautions discussed as improving control.  Cough - Plan: DG Chest 2 View, fluticasone (FLOVENT HFA) 44 MCG/ACT inhaler  -Improving, check chest x-ray.  Mucinex if needed but if wheezing returns should start Flovent inhaler.  Qvar not covered.  RTC/ER precautions.  Right leg swelling  -No calf pain, negative Homans, unlikely DVT.  Intermittent pain or swelling of right knee, may be dependent edema.  Deferred imaging at this time with option of ultrasound if any new pain or worsening swelling.  Meds ordered this encounter  Medications   fluticasone (FLOVENT HFA) 44 MCG/ACT inhaler    Sig: Inhale 1-2 puffs into the lungs 2 (two) times daily as needed.    Dispense:  1 each    Refill:  1   Patient Instructions  Increase lantus to 44 units, then continue to increase by 2 units every 3 days until readings remain below 200. Watch for low readings as we improve control. Do not skip  meals.  No change in [place] for now. If any low readings let me know right away. See precuations below.   I will order xray for cough. You can try mucinex for cough if needed. If wheezing, should have new inhlaler at your pharmacy to try. Depending on xray can discuss other treatments if needed for your cough.  Let me know how you are doing in next week.   Return to the clinic or go to the nearest emergency room if any of your symptoms worsen or new symptoms occur.  Hypoglycemia Hypoglycemia occurs when the level of sugar (glucose) in the blood is too low. Hypoglycemia can happen in people who have or do not have diabetes. It can develop quickly, and it can be a medical emergency. For most people, a blood glucose level below 70 mg/dL (3.9 mmol/L) is consideredhypoglycemia. Glucose is a type of sugar that provides the body's main source of energy. Certain hormones (insulin and glucagon) control the level of glucose in the blood. Insulin lowers blood glucose, and glucagon raises blood glucose. Hypoglycemia can result from having too much insulin in the bloodstream, or from not eating enough food that contains glucose. You may also have reactive hypoglycemia, which happens within 4 hoursafter eating a meal. What are the causes? Hypoglycemia occurs most often in  people who have diabetes and may be caused by: Diabetes medicine. Not eating enough, or not eating often enough. Increased physical activity. Drinking alcohol on an empty stomach. If you do not have diabetes, hypoglycemia may be caused by: A tumor in the pancreas. Not eating enough, or not eating for long periods at a time (fasting). A severe infection or illness. Problems after having bariatric surgery. Organ failure, such as kidney or liver failure. Certain medicines. What increases the risk? Hypoglycemia is more likely to develop in people who: Have diabetes and take medicines to lower blood glucose. Abuse alcohol. Have a severe  illness. What are the signs or symptoms? Symptoms vary depending on whether the condition is mild, moderate, or severe. Mild hypoglycemia Hunger. Sweating and feeling clammy. Dizziness or feeling light-headed. Sleepiness or restless sleep. Nausea. Increased heart rate. Headache. Blurry vision. Mood changes, such as irritability or anxiety. Tingling or numbness around the mouth, lips, or tongue. Moderate hypoglycemia Confusion and poor judgment. Behavior changes. Weakness. Irregular heartbeat. A change in coordination. Severe hypoglycemia Severe hypoglycemia is a medical emergency. It can cause: Fainting. Seizures. Loss of consciousness (coma). Death. How is this diagnosed? Hypoglycemia is diagnosed with a blood test to measure your blood glucose level. This blood test is done while you are having symptoms. Your health careprovider may also do a physical exam and review your medical history. How is this treated? This condition can be treated by immediately eating or drinking something that contains sugar with 15 grams of fast-acting carbohydrate, such as: 4 oz (120 mL) of fruit juice. 4 oz (120 mL) of regular soda (not diet soda). Several pieces of hard candy. Check food labels to find out how many pieces to eat for 15 grams. 1 Tbsp (15 mL) of sugar or honey. 4 glucose tablets. 1 tube of glucose gel. Treating hypoglycemia if you have diabetes If you are alert and able to swallow safely, follow the 15:15 rule: Take 15 grams of a fast-acting carbohydrate. Talk with your health care provider about how much you should take. Options for getting 15 grams of fast-acting carbohydrate include: Glucose tablets (take 4 tablets). Several pieces of hard candy. Check food labels to find out how many pieces to eat for 15 grams. 4 oz (120 mL) of fruit juice. 4 oz (120 mL) of regular soda (not diet soda). 1 Tbsp (15 mL) of sugar or honey. 1 tube of glucose gel. Check your blood glucose 15  minutes after you take the carbohydrate. If the repeat blood glucose level is still at or below 70 mg/dL (3.9 mmol/L), take 15 grams of a carbohydrate again. If your blood glucose level does not increase above 70 mg/dL (3.9 mmol/L) after 3 tries, seek emergency medical care. After your blood glucose level returns to normal, eat a meal or a snack within 1 hour.  Treating severe hypoglycemia Severe hypoglycemia is when your blood glucose level is below 54 mg/dL (3 mmol/L). Severe hypoglycemia is a medical emergency. Get medical help right away. If you have severe hypoglycemia and you cannot eat or drink, you will need to be given glucagon. A family member or close friend should learn how to check your blood glucose and how to give you glucagon. Ask your health care providerif you need to have an emergency glucagon kit available. Severe hypoglycemia may need to be treated in a hospital. The treatment may include getting glucose through an IV. You may also need treatment for thecause of your hypoglycemia. Follow these instructions at  home:  General instructions Take over-the-counter and prescription medicines only as told by your health care provider. Monitor your blood glucose as told by your health care provider. If you drink alcohol: Limit how much you have to: 0-1 drink a day for women who are not pregnant. 0-2 drinks a day for men. Know how much alcohol is in your drink. In the [place] one drink equals one 12 oz bottle of beer (355 mL), one 5 oz glass of wine (148 mL), or one 1 oz glass of hard liquor (44 mL). Be sure to eat food along with drinking alcohol. Be aware that alcohol is absorbed quickly and may have lingering effects that may result in hypoglycemia later. Be sure to do ongoing glucose monitoring. Keep all follow-up visits. This is important. If you have diabetes: Always have a fast-acting carbohydrate (15 grams) option with you to treat low blood glucose. Follow your diabetes  management plan as directed by your health care provider. Make sure you: Know the symptoms of hypoglycemia. It is important to treat it right away to prevent it from becoming severe. Check your blood glucose as often as told. Always check before and after exercise. Always check your blood glucose before you drive a motorized vehicle. Take your medicines as told. Follow your meal plan. Eat on time, and do not skip meals. Share your diabetes management plan with people in your workplace, school, and household. Carry a medical alert card or wear medical alert jewelry. Where to find more information American Diabetes Association: www.diabetes.org Contact a health care provider if: You have problems keeping your blood glucose in your target range. You have frequent episodes of hypoglycemia. Get help right away if: You continue to have hypoglycemia symptoms after eating or drinking something that contains 15 grams of fast-acting carbohydrate, and you cannot get your blood glucose above 70 mg/dL (3.9 mmol/L) while following the 15:15 rule. Your blood glucose is below 54 mg/dL (3 mmol/L). You have a seizure. You faint. These symptoms may represent a serious problem that is an emergency. Do not wait to see if the symptoms will go away. Get medical help right away. Call your local emergency services (911 in the [place] Do not drive yourself to the hospital. Summary Hypoglycemia occurs when the level of sugar (glucose) in the blood is too low. Hypoglycemia can happen in people who have or do not have diabetes. It can develop quickly, and it can be a medical emergency. Make sure you know the symptoms of hypoglycemia and how to treat it. Always have a fast-acting carbohydrate option with you to treat low blood sugar. This information is not intended to replace advice given to you by your health care provider. Make sure you discuss any questions you have with your healthcare provider. Document Revised:  10/17/2020 Document Reviewed: 10/17/2020 Elsevier Patient Education  2022 [place].   Cough, Adult Coughing is a reflex that clears your throat and your airways (respiratory system). Coughing helps to heal and protect your lungs. It is normal to cough occasionally, but a cough that happens with other symptoms or lasts a long time may be a sign of a condition that needs treatment. An acute cough may only last2-3 weeks, while a chronic cough may last 8 or more weeks. Coughing is commonly caused by: Infection of the respiratory systemby viruses or bacteria. Breathing in substances that irritate your lungs. Allergies. Asthma. Mucus that runs down the back of your throat (postnasal drip). Smoking. Acid backing up from the  stomach into the esophagus (gastroesophageal reflux). Certain medicines. Chronic lung problems. Other medical conditions such as heart failure or a blood clot in the lung (pulmonary embolism). Follow these instructions at home: Medicines Take over-the-counter and prescription medicines only as told by your health care provider. Talk with your health care provider before you take a cough suppressant medicine. Lifestyle  Avoid cigarette smoke. Do not use any products that contain nicotine or tobacco, such as cigarettes, e-cigarettes, and chewing tobacco. If you need help quitting, ask your health care provider. Drink enough fluid to keep your urine pale yellow. Avoid caffeine. Do not drink alcohol if your health care provider tells you not to drink.  General instructions  Pay close attention to changes in your cough. Tell your health care provider about them. Always cover your mouth when you cough. Avoid things that make you cough, such as perfume, candles, cleaning products, or campfire or tobacco smoke. If the air is dry, use a cool mist vaporizer or humidifier in your bedroom or your home to help loosen secretions. If your cough is worse at night, try to sleep in  a semi-upright position. Rest as needed. Keep all follow-up visits as told by your health care provider. This is important.  Contact a health care provider if you: Have new symptoms. Cough up pus. Have a cough that does not get better after 2-3 weeks or gets worse. Cannot control your cough with cough suppressant medicines and you are losing sleep. Have pain that gets worse or pain that is not helped with medicine. Have a fever. Have unexplained weight loss. Have night sweats. Get help right away if: You cough up blood. You have difficulty breathing. Your heartbeat is very fast. These symptoms may represent a serious problem that is an emergency. Do not wait to see if the symptoms will go away. Get medical help right away. Call your local emergency services (911 in the [place] Do not drive yourself to the hospital. Summary Coughing is a reflex that clears your throat and your airways. It is normal to cough occasionally, but a cough that happens with other symptoms or lasts a long time may be a sign of a condition that needs treatment. Take over-the-counter and prescription medicines only as told by your health care provider. Always cover your mouth when you cough. Contact a health care provider if you have new symptoms or a cough that does not get better after 2-3 weeks or gets worse. This information is not intended to replace advice given to you by your health care provider. Make sure you discuss any questions you have with your healthcare provider. Document Revised: 12/05/2018 Document Reviewed: 12/05/2018 Elsevier Patient Education  2022 [place],   Merri Ray, MD [place] 05/21/21 5:57 PM

## 2021-05-21 NOTE — Patient Instructions (Addendum)
Increase lantus to 44 units, then continue to increase by 2 units every 3 days until readings remain below 200. Watch for low readings as we improve control. Do not skip meals.  No change in [place] for now. If any low readings let me know right away. See precuations below.   I will order xray for cough. You can try mucinex for cough if needed. If wheezing, should have new inhlaler at your pharmacy to try. Depending on xray can discuss other treatments if needed for your cough.  Let me know how you are doing in next week.   Return to the clinic or go to the nearest emergency room if any of your symptoms worsen or new symptoms occur.  Hypoglycemia Hypoglycemia occurs when the level of sugar (glucose) in the blood is too low. Hypoglycemia can happen in people who have or do not have diabetes. It can develop quickly, and it can be a medical emergency. For most people, a blood glucose level below 70 mg/dL (3.9 mmol/L) is consideredhypoglycemia. Glucose is a type of sugar that provides the body's main source of energy. Certain hormones (insulin and glucagon) control the level of glucose in the blood. Insulin lowers blood glucose, and glucagon raises blood glucose. Hypoglycemia can result from having too much insulin in the bloodstream, or from not eating enough food that contains glucose. You may also have reactive hypoglycemia, which happens within 4 hoursafter eating a meal. What are the causes? Hypoglycemia occurs most often in people who have diabetes and may be caused by: Diabetes medicine. Not eating enough, or not eating often enough. Increased physical activity. Drinking alcohol on an empty stomach. If you do not have diabetes, hypoglycemia may be caused by: A tumor in the pancreas. Not eating enough, or not eating for long periods at a time (fasting). A severe infection or illness. Problems after having bariatric surgery. Organ failure, such as kidney or liver failure. Certain  medicines. What increases the risk? Hypoglycemia is more likely to develop in people who: Have diabetes and take medicines to lower blood glucose. Abuse alcohol. Have a severe illness. What are the signs or symptoms? Symptoms vary depending on whether the condition is mild, moderate, or severe. Mild hypoglycemia Hunger. Sweating and feeling clammy. Dizziness or feeling light-headed. Sleepiness or restless sleep. Nausea. Increased heart rate. Headache. Blurry vision. Mood changes, such as irritability or anxiety. Tingling or numbness around the mouth, lips, or tongue. Moderate hypoglycemia Confusion and poor judgment. Behavior changes. Weakness. Irregular heartbeat. A change in coordination. Severe hypoglycemia Severe hypoglycemia is a medical emergency. It can cause: Fainting. Seizures. Loss of consciousness (coma). Death. How is this diagnosed? Hypoglycemia is diagnosed with a blood test to measure your blood glucose level. This blood test is done while you are having symptoms. Your health careprovider may also do a physical exam and review your medical history. How is this treated? This condition can be treated by immediately eating or drinking something that contains sugar with 15 grams of fast-acting carbohydrate, such as: 4 oz (120 mL) of fruit juice. 4 oz (120 mL) of regular soda (not diet soda). Several pieces of hard candy. Check food labels to find out how many pieces to eat for 15 grams. 1 Tbsp (15 mL) of sugar or honey. 4 glucose tablets. 1 tube of glucose gel. Treating hypoglycemia if you have diabetes If you are alert and able to swallow safely, follow the 15:15 rule: Take 15 grams of a fast-acting carbohydrate. Talk with your health  care provider about how much you should take. Options for getting 15 grams of fast-acting carbohydrate include: Glucose tablets (take 4 tablets). Several pieces of hard candy. Check food labels to find out how many pieces to eat  for 15 grams. 4 oz (120 mL) of fruit juice. 4 oz (120 mL) of regular soda (not diet soda). 1 Tbsp (15 mL) of sugar or honey. 1 tube of glucose gel. Check your blood glucose 15 minutes after you take the carbohydrate. If the repeat blood glucose level is still at or below 70 mg/dL (3.9 mmol/L), take 15 grams of a carbohydrate again. If your blood glucose level does not increase above 70 mg/dL (3.9 mmol/L) after 3 tries, seek emergency medical care. After your blood glucose level returns to normal, eat a meal or a snack within 1 hour.  Treating severe hypoglycemia Severe hypoglycemia is when your blood glucose level is below 54 mg/dL (3 mmol/L). Severe hypoglycemia is a medical emergency. Get medical help right away. If you have severe hypoglycemia and you cannot eat or drink, you will need to be given glucagon. A family member or close friend should learn how to check your blood glucose and how to give you glucagon. Ask your health care providerif you need to have an emergency glucagon kit available. Severe hypoglycemia may need to be treated in a hospital. The treatment may include getting glucose through an IV. You may also need treatment for thecause of your hypoglycemia. Follow these instructions at home:  General instructions Take over-the-counter and prescription medicines only as told by your health care provider. Monitor your blood glucose as told by your health care provider. If you drink alcohol: Limit how much you have to: 0-1 drink a day for women who are not pregnant. 0-2 drinks a day for men. Know how much alcohol is in your drink. In the [place] one drink equals one 12 oz bottle of beer (355 mL), one 5 oz glass of wine (148 mL), or one 1 oz glass of hard liquor (44 mL). Be sure to eat food along with drinking alcohol. Be aware that alcohol is absorbed quickly and may have lingering effects that may result in hypoglycemia later. Be sure to do ongoing glucose monitoring. Keep all  follow-up visits. This is important. If you have diabetes: Always have a fast-acting carbohydrate (15 grams) option with you to treat low blood glucose. Follow your diabetes management plan as directed by your health care provider. Make sure you: Know the symptoms of hypoglycemia. It is important to treat it right away to prevent it from becoming severe. Check your blood glucose as often as told. Always check before and after exercise. Always check your blood glucose before you drive a motorized vehicle. Take your medicines as told. Follow your meal plan. Eat on time, and do not skip meals. Share your diabetes management plan with people in your workplace, school, and household. Carry a medical alert card or wear medical alert jewelry. Where to find more information American Diabetes Association: www.diabetes.org Contact a health care provider if: You have problems keeping your blood glucose in your target range. You have frequent episodes of hypoglycemia. Get help right away if: You continue to have hypoglycemia symptoms after eating or drinking something that contains 15 grams of fast-acting carbohydrate, and you cannot get your blood glucose above 70 mg/dL (3.9 mmol/L) while following the 15:15 rule. Your blood glucose is below 54 mg/dL (3 mmol/L). You have a seizure. You faint. These symptoms may  represent a serious problem that is an emergency. Do not wait to see if the symptoms will go away. Get medical help right away. Call your local emergency services (911 in the [place] Do not drive yourself to the hospital. Summary Hypoglycemia occurs when the level of sugar (glucose) in the blood is too low. Hypoglycemia can happen in people who have or do not have diabetes. It can develop quickly, and it can be a medical emergency. Make sure you know the symptoms of hypoglycemia and how to treat it. Always have a fast-acting carbohydrate option with you to treat low blood sugar. This  information is not intended to replace advice given to you by your health care provider. Make sure you discuss any questions you have with your healthcare provider. Document Revised: 10/17/2020 Document Reviewed: 10/17/2020 Elsevier Patient Education  2022 [place].   Cough, Adult Coughing is a reflex that clears your throat and your airways (respiratory system). Coughing helps to heal and protect your lungs. It is normal to cough occasionally, but a cough that happens with other symptoms or lasts a long time may be a sign of a condition that needs treatment. An acute cough may only last2-3 weeks, while a chronic cough may last 8 or more weeks. Coughing is commonly caused by: Infection of the respiratory systemby viruses or bacteria. Breathing in substances that irritate your lungs. Allergies. Asthma. Mucus that runs down the back of your throat (postnasal drip). Smoking. Acid backing up from the stomach into the esophagus (gastroesophageal reflux). Certain medicines. Chronic lung problems. Other medical conditions such as heart failure or a blood clot in the lung (pulmonary embolism). Follow these instructions at home: Medicines Take over-the-counter and prescription medicines only as told by your health care provider. Talk with your health care provider before you take a cough suppressant medicine. Lifestyle  Avoid cigarette smoke. Do not use any products that contain nicotine or tobacco, such as cigarettes, e-cigarettes, and chewing tobacco. If you need help quitting, ask your health care provider. Drink enough fluid to keep your urine pale yellow. Avoid caffeine. Do not drink alcohol if your health care provider tells you not to drink.  General instructions  Pay close attention to changes in your cough. Tell your health care provider about them. Always cover your mouth when you cough. Avoid things that make you cough, such as perfume, candles, cleaning products, or campfire  or tobacco smoke. If the air is dry, use a cool mist vaporizer or humidifier in your bedroom or your home to help loosen secretions. If your cough is worse at night, try to sleep in a semi-upright position. Rest as needed. Keep all follow-up visits as told by your health care provider. This is important.  Contact a health care provider if you: Have new symptoms. Cough up pus. Have a cough that does not get better after 2-3 weeks or gets worse. Cannot control your cough with cough suppressant medicines and you are losing sleep. Have pain that gets worse or pain that is not helped with medicine. Have a fever. Have unexplained weight loss. Have night sweats. Get help right away if: You cough up blood. You have difficulty breathing. Your heartbeat is very fast. These symptoms may represent a serious problem that is an emergency. Do not wait to see if the symptoms will go away. Get medical help right away. Call your local emergency services (911 in the [place] Do not drive yourself to the hospital. Summary Coughing is a reflex that  clears your throat and your airways. It is normal to cough occasionally, but a cough that happens with other symptoms or lasts a long time may be a sign of a condition that needs treatment. Take over-the-counter and prescription medicines only as told by your health care provider. Always cover your mouth when you cough. Contact a health care provider if you have new symptoms or a cough that does not get better after 2-3 weeks or gets worse. This information is not intended to replace advice given to you by your health care provider. Make sure you discuss any questions you have with your healthcare provider. Document Revised: 12/05/2018 Document Reviewed: 12/05/2018 Elsevier Patient Education  [place].

## 2021-05-23 ENCOUNTER — Telehealth: Payer: Self-pay | Admitting: Family Medicine

## 2021-05-23 ENCOUNTER — Ambulatory Visit
Admission: RE | Admit: 2021-05-23 | Discharge: 2021-05-23 | Disposition: A | Discharge status: Home / Self Care | Payer: Medicare Other | Source: Ambulatory Visit | Attending: Family Medicine | Admitting: Family Medicine

## 2021-05-23 DIAGNOSIS — R059 Cough, unspecified: Secondary | ICD-10-CM

## 2021-05-23 NOTE — Telephone Encounter (Signed)
Stable at visit yesterday and decided against U/S at this time.

## 2021-05-23 NOTE — Telephone Encounter (Signed)
Please change the location from [facility] to another site for the [facility] [place]   [facility] hasn't got set up yet.  

## 2021-05-26 ENCOUNTER — Telehealth: Payer: Self-pay | Admitting: Family Medicine

## 2021-05-26 NOTE — Telephone Encounter (Signed)
Pt called in stating that she is still having congestion and wanted to know her results from the chest xray she had done.   Please advise

## 2021-05-27 NOTE — Telephone Encounter (Signed)
Pt call in asking for results from the xray. She states that she isn't feeling any better.   Wants to know what to do, please advise

## 2021-05-27 NOTE — Telephone Encounter (Signed)
Ok thanks. Flovent inhaler sent 05/21/21 to [facility] [address].

## 2021-05-27 NOTE — Telephone Encounter (Signed)
Pt reports about same but mucous now green yellow instead of clear. Pt doing virtual to follow up tomorrow no new sxs, never was given flovent inhaler but is taking musinex

## 2021-05-27 NOTE — Telephone Encounter (Signed)
See chest x-ray report.  If not improving needs repeat visit.  ER or urgent care if worsening or acutely more short of breath

## 2021-05-28 ENCOUNTER — Other Ambulatory Visit: Payer: Self-pay

## 2021-05-28 ENCOUNTER — Telehealth (INDEPENDENT_AMBULATORY_CARE_PROVIDER_SITE_OTHER): Payer: Medicare Other | Admitting: Family Medicine

## 2021-05-28 ENCOUNTER — Encounter: Payer: Self-pay | Admitting: Family Medicine

## 2021-05-28 DIAGNOSIS — R059 Cough, unspecified: Secondary | ICD-10-CM

## 2021-05-28 DIAGNOSIS — R062 Wheezing: Secondary | ICD-10-CM

## 2021-05-28 MED ORDER — FLUTICASONE PROPIONATE HFA 44 MCG/ACT IN AERO: 1.0000 | INHALATION_SPRAY | Freq: Two times a day (BID) | RESPIRATORY_TRACT | 1 refills | Status: DC | PRN

## 2021-05-28 MED ORDER — AZITHROMYCIN 250 MG PO TABS: ORAL_TABLET | ORAL | 0 refills | Status: AC

## 2021-05-28 NOTE — Progress Notes (Signed)
Virtual Visit via Video Note Initial 10 min chart review.  I connected with [name] on 05/28/21 at 5:54 PM by a video enabled telemedicine application and verified that I am speaking with the correct person using two identifiers.  Patient location: home My location: office - [place].    I discussed the limitations, risks, security and privacy concerns of performing an evaluation and management service by telephone and the availability of in person appointments. I also discussed with the patient that there may be a patient responsible charge related to this service. The patient expressed understanding and agreed to proceed, consent obtained  Chief complaint:  Chief Complaint  Patient presents with   Cough    Ongoing x 5 weeks, worsening over the past week   History of Present Illness: Felicia Hanson is a 65 y.o. female  Cough: Initially discussed June 6.  Had recent COVID-19 infection May 6 that was treated with Paxlovid.  Had returned to normal self. Chest x-ray March 25 indicated resolved bilateral lung opacities likely resolved pulmonary edema and no acute findings with mild peribronchial thickening thought to be chronic.  June 6 visit noted slight cough with some upper chest congestion, slight scratchy throat.  Possible viral syndrome versus allergies.  Sick contacts in family with sinus issues but all testing in family was negative.  Recommended trial of Zyrtec, Flonase for possible allergic cause.   Video visit on June 9 with my colleague.  At that time cough was improving, sore throat had resolved but persistent nasal congestion with low-grade temp of 99.5, slight wheeze.  Minimal change with Flonase/Zyrtec.  Treated with amoxicillin 875 mg twice daily for 7 days, ordered Qvar inhaler.  Saw me June 22.  Drainage have improved with Zyrtec and Flonase, some improvement of cough but still some chest congestion after use of amoxicillin with some discolored sputum at night.  Some  wheezing with cough.  Had not filled Qvar, did not have this from her pharmacy.  I prescribed Flovent inhaler as Qvar was not covered.  Repeat chest x-ray June 24: Some chronically coarsened perihilar markings. No features of frank edema at this time. No pneumothorax. No visible effusion. No focal consolidative process. The cardiomediastinal contours are unremarkable. No acute osseous or soft tissue abnormality.  Unable to pick up flovent inhaler until today. Has not yet used.  More cough at night. Still wheezing some during the day more at night. Min swelling in legs - no change or increase. Some mucus production at times - clear to yellow tint. No dyspnea. Does not feel like CHF exacerbation in February.  More wheezing at night when lying down. Has proair at home, has not used - thought was for emergencies.  Low grade temp - under 100.  Negative covid test 1.5 weeks ago.         Patient Active Problem List   Diagnosis Date Noted   Shortness of breath at rest 05/09/2021   Loud snoring 03/24/2021   Excessive daytime sleepiness 03/24/2021   Acute on chronic combined systolic and diastolic HF (heart failure) (HCC) 03/24/2021   Panlobular emphysema (HCC) 03/24/2021   GAD (generalized anxiety disorder) 03/24/2021   Atypical chest pain 01/22/2021   Respiratory distress 01/20/2021   Acute on chronic combined systolic and diastolic CHF (congestive heart failure) (HCC) 01/20/2021   Type 2 diabetes mellitus without complication, without long-term current use of insulin (HCC) 10/26/2017   Generalized anxiety disorder 01/10/2014   Vitamin D deficiency 01/10/2014  Essential hypertension, benign 01/10/2014   Pure hypercholesterolemia 01/10/2014   Hypothyroidism 01/10/2014   Past Medical History:  Diagnosis Date   Anxiety    Coronary artery calcification    Depression    Diabetes mellitus without complication (HCC)    Hyperlipidemia    Hypertension    Thyroid disease    Past Surgical  History:  Procedure Laterality Date   BREAST EXCISIONAL BIOPSY Left    CHOLECYSTECTOMY     COLON SURGERY     diverticulitis with perforation; s/p colon resection.   Colonoscopy     DENTAL SURGERY     RIGHT/LEFT HEART CATH AND CORONARY ANGIOGRAPHY N/A 01/22/2021   Procedure: RIGHT/LEFT HEART CATH AND CORONARY ANGIOGRAPHY;  Surgeon: Yates Decamp, MD;  Location: [facility];  Service: Cardiovascular;  Laterality: N/A;   TUBAL LIGATION     Allergies  Allergen Reactions   Metformin And Related Anaphylaxis    Could not swallow   Nicotine Rash    Only allergic to nicotine patch   Ace Inhibitors Cough    Cough with lisinopril.    Prior to Admission medications   Medication Sig Start Date End Date Taking? Authorizing Provider  ACCU-CHEK AVIVA PLUS test strip daily. for testing 03/11/18  Yes [provider]  albuterol (VENTOLIN HFA) 108 (90 Base) MCG/ACT inhaler Inhale 1 puff into the lungs every 6 (six) hours as needed for wheezing or shortness of breath.   Yes [provider]  ALPRAZolam Prudy Feeler) 1 MG tablet Take 0.5 mg by mouth 3 (three) times daily as needed for anxiety or sleep.  08/21/13  Yes [provider]  aspirin 81 MG tablet Take 81 mg by mouth daily.   Yes [provider]  beclomethasone (QVAR) 40 MCG/ACT inhaler Inhale 1 puff into the lungs daily. 05/08/21  Yes Janeece Agee, NP  BLACK ELDERBERRY PO Take 10 mLs by mouth daily.   Yes [provider]  cetirizine (ZYRTEC) 10 MG tablet Take 10 mg by mouth daily.   Yes [provider]  Continuous Blood Gluc Receiver (DEXCOM G6 RECEIVER) DEVI 1 Device by Does not apply route every morning. Device and supplies 04/18/20  Yes Shade Flood, MD  dapagliflozin propanediol (FARXIGA) 10 MG TABS tablet Take 1 tablet (10 mg total) by mouth daily. 01/25/21  Yes Regalado, Belkys A, MD  DULoxetine (CYMBALTA) 60 MG capsule Take 2 capsules (120 mg total) by mouth daily. 12/06/20  Yes Shade Flood, MD  ezetimibe (ZETIA) 10 MG tablet TAKE 1 TABLET BY MOUTH EVERY DAY 04/23/21  Yes Shade Flood, MD  famotidine (PEPCID) 20 MG tablet Take 20 mg by mouth daily as needed for heartburn or indigestion.   Yes [provider]  insulin glargine (LANTUS SOLOSTAR) 100 UNIT/ML Solostar Pen Inject 42 Units into the skin daily. 05/05/21  Yes Shade Flood, MD  Insulin Pen Needle 31G X 5 MM MISC Inject 1 each into the skin daily. 02/10/21  Yes Shade Flood, MD  levothyroxine (SYNTHROID) 88 MCG tablet TAKE 1 TABLET (88 MCG TOTAL) BY MOUTH DAILY BEFORE BREAKFAST. 01/19/21  Yes Shade Flood, MD  magnesium oxide (MAG-OX) 400 (240 Mg) MG tablet TAKE 0.5 TABLETS (200 MG TOTAL) BY MOUTH 2 (TWO) TIMES DAILY. 04/29/21  Yes Tolia, Sunit, DO  metoprolol succinate (TOPROL-XL) 100 MG 24 hr tablet Take 1 tablet (100 mg total) by mouth daily. Take with or immediately following a meal. 02/25/21  Yes Tolia, Sunit, DO  naproxen sodium (  ALEVE) 220 MG tablet Take 220 mg by mouth daily as needed.   Yes [provider]  nitroGLYCERIN (NITROSTAT) 0.4 MG SL tablet PLACE 1 TABLET UNDER THE TONGUE EVERY 5 MINUTES AS NEEDED FOR CHEST PAIN. IF YOU REQUIRE MORE THAN TWO TABLETS FIVE MINUTES APART GO TO THE NEAREST ER VIA EMS. Patient taking differently: Place 0.4 mg under the tongue every 5 (five) minutes as needed for chest pain. 07/29/20  Yes Tolia, Sunit, DO  ondansetron (ZOFRAN ODT) 4 MG disintegrating tablet Take 1 tablet (4 mg total) by mouth every 8 (eight) hours as needed for nausea or vomiting. 07/14/19  Yes Stallings, Zoe A, MD  pravastatin (PRAVACHOL) 80 MG tablet Take 1 tablet (80 mg total) by mouth every evening. 05/05/21 08/03/21 Yes Shade Flood, MD  sacubitril-valsartan (ENTRESTO) 97-103 MG Take 1 tablet by mouth 2 (two) times daily. 03/07/21  Yes Tolia, Sunit, DO  spironolactone (ALDACTONE) 25 MG tablet Take 0.5 tablets (12.5 mg total) by mouth daily. Patient taking differently: Take 25  mg by mouth daily. 01/29/21  Yes Angelita Ingles, MD  Vitamin D, Ergocalciferol, (DRISDOL) 1.25 MG (50000 UNIT) CAPS capsule TAKE 1 CAPSULE (50,000 UNITS TOTAL) BY MOUTH EVERY 7 (SEVEN) DAYS 04/02/21  Yes Shade Flood, MD  fluticasone (FLOVENT HFA) 44 MCG/ACT inhaler Inhale 1-2 puffs into the lungs 2 (two) times daily as needed. 05/28/21   Shade Flood, MD   Social History   Socioeconomic History   Marital status: Married    Spouse name: Not on file   Number of children: 2   Years of education: some college   Highest education level: Not on file  Occupational History   Occupation: Retired  Tobacco Use   Smoking status: Every Day    Years: 38.00    Pack years: 0.00    Types: Cigarettes   Smokeless tobacco: Never   Tobacco comments:    2-5 A DAY  Vaping Use   Vaping Use: Never used  Substance and Sexual Activity   Alcohol use: No    Alcohol/week: 0.0 standard drinks   Drug use: No   Sexual activity: Not Currently  Other Topics Concern   Not on file  Social History Narrative   Marital status: married      Children:  2 children; 4 grandchildren      Lives: with husband, 2 granddaughters      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   Left-handed.   Caffeine use: 2.5 cups caffeine per day.   Social Determinants of Health   Financial Resource Strain: Low Risk    Difficulty of Paying Living Expenses: Not very hard  Food Insecurity: No Food Insecurity   Worried About Programme researcher, broadcasting/film/video in the Last Year: Never true   Ran Out of Food in the Last Year: Never true  Transportation Needs: No Transportation Needs   Lack of Transportation (Medical): No   Lack of Transportation (Non-Medical): No  Physical Activity: Not on file  Stress: Not on file  Social Connections: Not on file  Intimate Partner Violence: Not on file    Observations/Objective: There were no vitals filed for this visit. Temp 97.9.   Speaking in full sentences, no respiratory distress or audible wheeze over video.  Euthymic mood.  Coherent responses.  All questions answered with understanding of plan expressed.  Assessment and Plan: Cough -  Plan: azithromycin (ZITHROMAX) 250 MG tablet  Wheezing - Plan: azithromycin (ZITHROMAX) 250 MG tablet  -Persistent cough with slight increase cough past week, no true fever.  No apparent increasing pedal edema/swelling.  Unlikely CHF exacerbation.  Overall reassuring chest x-ray 5 days ago without signs of pulmonary edema, effusion or infiltrate.  Has not tried steroid inhaler or short acting beta agonist.  Possible secondary bacterial infection/early community-acquired pneumonia with increased cough past week.  Still suspect some component of reactive airway but differential includes CHF with wheeze.  Unlikely COVID infection, but repeat home testing discussed.  -Initial trial of azithromycin, Z-Pak, start Flovent inhaler twice daily, albuterol lowest dose needed if needed for wheezing with potential side effects discussed.  If any dyspnea, or worsening symptoms including edema, ER/urgent care evaluation discussed.  Follow-up in 1 week for recheck.  Understanding expressed including ER/urgent care precautions.     Follow Up Instructions:  1 week I discussed the assessment and treatment plan with the patient. The patient was provided an opportunity to ask questions and all were answered. The patient agreed with the plan and demonstrated an understanding of the instructions.   The patient was advised to call back or seek an in-person evaluation if the symptoms worsen or if the condition fails to improve as anticipated.  I provided 21 minutes of non-face-to-face time during this encounter.   Shade Flood, MD

## 2021-05-30 ENCOUNTER — Other Ambulatory Visit: Payer: Self-pay | Admitting: Cardiology

## 2021-05-30 DIAGNOSIS — I5042 Chronic combined systolic (congestive) and diastolic (congestive) heart failure: Secondary | ICD-10-CM

## 2021-06-05 ENCOUNTER — Other Ambulatory Visit: Payer: Self-pay | Admitting: Family Medicine

## 2021-06-05 DIAGNOSIS — F418 Other specified anxiety disorders: Secondary | ICD-10-CM

## 2021-06-15 ENCOUNTER — Other Ambulatory Visit: Payer: Self-pay | Admitting: Family Medicine

## 2021-06-15 DIAGNOSIS — E034 Atrophy of thyroid (acquired): Secondary | ICD-10-CM

## 2021-06-15 DIAGNOSIS — R7989 Other specified abnormal findings of blood chemistry: Secondary | ICD-10-CM

## 2021-06-16 ENCOUNTER — Ambulatory Visit: Payer: Medicare Other | Admitting: Family Medicine

## 2021-06-16 NOTE — Telephone Encounter (Signed)
Last filled 01/29/21 #30 with 3 refills by a historical provider

## 2021-06-17 NOTE — Telephone Encounter (Signed)
Please verify dose.  Is she taking 1 pill or 1/2 pill/day?  Okay to refill at her current dosing once verified,, 90 with 1 refill.

## 2021-06-17 NOTE — Telephone Encounter (Signed)
Called patient to find out how she is taking her spirolactone but her mailbox is full, unable to leave a message.

## 2021-06-19 ENCOUNTER — Other Ambulatory Visit: Payer: Self-pay

## 2021-06-19 NOTE — Telephone Encounter (Signed)
Tried calling patient again but mailbox is full.

## 2021-06-19 NOTE — Telephone Encounter (Signed)
Called patient and she states she is taking spironolactone 25mg  daily. Prescriptions sent to pharmacy.

## 2021-06-23 ENCOUNTER — Other Ambulatory Visit: Payer: Self-pay | Admitting: Cardiology

## 2021-06-23 ENCOUNTER — Other Ambulatory Visit: Payer: Self-pay

## 2021-06-23 ENCOUNTER — Encounter: Payer: Self-pay | Admitting: Family Medicine

## 2021-06-23 ENCOUNTER — Telehealth (INDEPENDENT_AMBULATORY_CARE_PROVIDER_SITE_OTHER): Payer: Medicare Other | Admitting: Family Medicine

## 2021-06-23 VITALS — Temp 97.9°F

## 2021-06-23 DIAGNOSIS — R059 Cough, unspecified: Secondary | ICD-10-CM | POA: Diagnosis not present

## 2021-06-23 DIAGNOSIS — I5042 Chronic combined systolic (congestive) and diastolic (congestive) heart failure: Secondary | ICD-10-CM

## 2021-06-23 NOTE — Progress Notes (Addendum)
Virtual Visit via audio Note  I connected with Felicia Hanson on 06/23/21 at 12:26 PM by phone - internet is out at home and unable to connect to video over phone, verified that I am speaking with the correct person using two identifiers.  Patient location:home  My location: office - [place].    I discussed the limitations, risks, security and privacy concerns of performing an evaluation and management service by telephone and the availability of in person appointments. I also discussed with the patient that there may be a patient responsible charge related to this service. The patient expressed understanding and agreed to proceed, consent obtained  Chief complaint: Chief Complaint  Patient presents with   Pneumonia    Pt reports pt reports she is getting some energy back, reports productive cough, pt reports doing mostly better.     History of Present Illness: Felicia Hanson is a 65 y.o. female  Cough: See previous visits.  Last discussed over video visit on June 29. COVID infection in May.  Mild peribronchial thickening resolved bilateral lung opacities March 25 x-ray.  Video visit June 9, treated with amoxicillin for 1 week, Qvar inhaler was ordered but not covered.  Flovent inhaler was ordered at June 22 visit.  Some improvement with Zyrtec, Flonase but persistent chest congestion.  Slight wheeze.  Repeat chest x-ray June 24 with chronic coarse perihilar markings without edema.  No focal consolidative process.  June 29 visit had not yet started Flovent inhaler.  Cough at night with some wheezing during the day, more at night.  Had not been using ProAir.  Low-grade temp under 100.  Slight increase cough the previous week, started on azithromycin.  And recommended Flovent.  Doing better after azithromycin. Taking fluticasone but only using once per day and only a few times. Nl side effects.  No need for rescue inhaler.  No recent fever.  Still some chest congestion, but more loose. No  recent wheeze.  Some fleeting soreness in chest yesterday, none today.  No dyspnea.  Energy improving. No new leg swelling. Tx: safetussin.    Patient Active Problem List   Diagnosis Date Noted   Shortness of breath at rest 05/09/2021   Loud snoring 03/24/2021   Excessive daytime sleepiness 03/24/2021   Acute on chronic combined systolic and diastolic HF (heart failure) (HCC) 03/24/2021   Panlobular emphysema (HCC) 03/24/2021   GAD (generalized anxiety disorder) 03/24/2021   Atypical chest pain 01/22/2021   Respiratory distress 01/20/2021   Acute on chronic combined systolic and diastolic CHF (congestive heart failure) (HCC) 01/20/2021   Type 2 diabetes mellitus without complication, without long-term current use of insulin (HCC) 10/26/2017   Generalized anxiety disorder 01/10/2014   Vitamin D deficiency 01/10/2014   Essential hypertension, benign 01/10/2014   Pure hypercholesterolemia 01/10/2014   Hypothyroidism 01/10/2014   Past Medical History:  Diagnosis Date   Anxiety    Coronary artery calcification    Depression    Diabetes mellitus without complication (HCC)    Hyperlipidemia    Hypertension    Thyroid disease    Past Surgical History:  Procedure Laterality Date   BREAST EXCISIONAL BIOPSY Left    CHOLECYSTECTOMY     COLON SURGERY     diverticulitis with perforation; s/p colon resection.   Colonoscopy     DENTAL SURGERY     RIGHT/LEFT HEART CATH AND CORONARY ANGIOGRAPHY N/A 01/22/2021   Procedure: RIGHT/LEFT HEART CATH AND CORONARY ANGIOGRAPHY;  Surgeon: Yates Decamp, MD;  Location:  [facility];  Service: Cardiovascular;  Laterality: N/A;   TUBAL LIGATION     Allergies  Allergen Reactions   Metformin And Related Anaphylaxis    Could not swallow   Nicotine Rash    Only allergic to nicotine patch   Ace Inhibitors Cough    Cough with lisinopril.    Prior to Admission medications   Medication Sig Start Date End Date Taking? Authorizing Provider   ACCU-CHEK AVIVA PLUS test strip daily. for testing 03/11/18  Yes [provider]  albuterol (VENTOLIN HFA) 108 (90 Base) MCG/ACT inhaler Inhale 1 puff into the lungs every 6 (six) hours as needed for wheezing or shortness of breath.   Yes [provider]  ALPRAZolam Prudy Feeler) 1 MG tablet Take 0.5 mg by mouth 3 (three) times daily as needed for anxiety or sleep.  08/21/13  Yes [provider]  aspirin 81 MG tablet Take 81 mg by mouth daily.   Yes [provider]  BLACK ELDERBERRY PO Take 10 mLs by mouth daily.   Yes [provider]  cetirizine (ZYRTEC) 10 MG tablet Take 10 mg by mouth daily.   Yes [provider]  Continuous Blood Gluc Receiver (DEXCOM G6 RECEIVER) DEVI 1 Device by Does not apply route every morning. Device and supplies 04/18/20  Yes Shade Flood, MD  dapagliflozin propanediol (FARXIGA) 10 MG TABS tablet Take 1 tablet (10 mg total) by mouth daily. 01/25/21  Yes Regalado, Belkys A, MD  DULoxetine (CYMBALTA) 60 MG capsule TAKE 2 CAPSULES BY MOUTH DAILY 06/06/21  Yes Shade Flood, MD  ezetimibe (ZETIA) 10 MG tablet TAKE 1 TABLET BY MOUTH EVERY DAY 04/23/21  Yes Shade Flood, MD  famotidine (PEPCID) 20 MG tablet Take 20 mg by mouth daily as needed for heartburn or indigestion.   Yes [provider]  fluticasone (FLOVENT HFA) 44 MCG/ACT inhaler Inhale 1-2 puffs into the lungs 2 (two) times daily as needed. 05/28/21  Yes Shade Flood, MD  insulin glargine (LANTUS SOLOSTAR) 100 UNIT/ML Solostar Pen Inject 42 Units into the skin daily. 05/05/21  Yes Shade Flood, MD  Insulin Pen Needle 31G X 5 MM MISC Inject 1 each into the skin daily. 02/10/21  Yes Shade Flood, MD  levothyroxine (SYNTHROID) 88 MCG tablet TAKE 1 TABLET BY MOUTH DAILY BEFORE BREAKFAST. 06/16/21  Yes Shade Flood, MD  magnesium oxide (MAG-OX) 400 (240 Mg) MG tablet TAKE 0.5 TABLETS (200 MG TOTAL) BY MOUTH 2 (TWO) TIMES DAILY. 05/30/21  Yes  Tolia, Sunit, DO  metoprolol succinate (TOPROL-XL) 100 MG 24 hr tablet Take 1 tablet (100 mg total) by mouth daily. Take with or immediately following a meal. 02/25/21  Yes Tolia, Sunit, DO  naproxen sodium (ALEVE) 220 MG tablet Take 220 mg by mouth daily as needed.   Yes [provider]  nitroGLYCERIN (NITROSTAT) 0.4 MG SL tablet PLACE 1 TABLET UNDER THE TONGUE EVERY 5 MINUTES AS NEEDED FOR CHEST PAIN. IF YOU REQUIRE MORE THAN TWO TABLETS FIVE MINUTES APART GO TO THE NEAREST ER VIA EMS. Patient taking differently: Place 0.4 mg under the tongue every 5 (five) minutes as needed for chest pain. 07/29/20  Yes Tolia, Sunit, DO  ondansetron (ZOFRAN ODT) 4 MG disintegrating tablet Take 1 tablet (4 mg total) by mouth every 8 (eight) hours as needed for nausea or vomiting. 07/14/19  Yes Stallings, Zoe A, MD  pravastatin (PRAVACHOL) 80 MG tablet Take 1 tablet (80 mg total) by mouth  every evening. 05/05/21 08/03/21 Yes Shade Flood, MD  sacubitril-valsartan (ENTRESTO) 97-103 MG Take 1 tablet by mouth 2 (two) times daily. 03/07/21  Yes Tolia, Sunit, DO  spironolactone (ALDACTONE) 25 MG tablet TAKE 1 TABLET BY MOUTH EVERY DAY 06/19/21  Yes Shade Flood, MD  Vitamin D, Ergocalciferol, (DRISDOL) 1.25 MG (50000 UNIT) CAPS capsule TAKE 1 CAPSULE (50,000 UNITS TOTAL) BY MOUTH EVERY 7 (SEVEN) DAYS 04/02/21  Yes Shade Flood, MD   Social History   Socioeconomic History   Marital status: Married    Spouse name: Not on file   Number of children: 2   Years of education: some college   Highest education level: Not on file  Occupational History   Occupation: Retired  Tobacco Use   Smoking status: Every Day    Years: 38.00    Types: Cigarettes   Smokeless tobacco: Never   Tobacco comments:    2-5 A DAY  Vaping Use   Vaping Use: Never used  Substance and Sexual Activity   Alcohol use: No    Alcohol/week: 0.0 standard drinks   Drug use: No   Sexual activity: Not Currently  Other Topics Concern    Not on file  Social History Narrative   Marital status: married      Children:  2 children; 4 grandchildren      Lives: with husband, 2 granddaughters      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   Left-handed.   Caffeine use: 2.5 cups caffeine per day.   Social Determinants of Health   Financial Resource Strain: Low Risk    Difficulty of Paying Living Expenses: Not very hard  Food Insecurity: No Food Insecurity   Worried About Programme researcher, broadcasting/film/video in the Last Year: Never true   Ran Out of Food in the Last Year: Never true  Transportation Needs: No Transportation Needs   Lack of Transportation (Medical): No   Lack of Transportation (Non-Medical): No  Physical Activity: Not on file  Stress: Not on file  Social Connections: Not on file  Intimate Partner Violence: Not on file    Observations/Objective: Vitals:   06/23/21 1151  Temp: 97.9 F (36.6 C)  Speaking in full sentences without respiratory distress.  Appropriate responses.  No audible wheeze.  All questions answered with understanding of plan expressed.   Assessment and Plan: Cough  -Improved, still some daily cough with some congestion but does report that is also loosening.  Improved after azithromycin.  Denies new edema or signs of fluid overload.  Some fleeting chest discomfort yesterday which is since resolved.  Infrequent inhaled corticosteroid use.  -Trial of Flovent consistently twice daily, with update on symptoms next 4 to 5 days.  ER precautions given if any worsening including return of chest pain.  Follow Up Instructions:  As needed, with ER precautions as above I discussed the assessment and treatment plan with the patient. The patient was provided an opportunity to ask questions and all were answered. The patient agreed with the plan and demonstrated an understanding of the instructions.   The patient was advised to call back or seek an  in-person evaluation if the symptoms worsen or if the condition fails to improve as anticipated.   12 minutes spent during visit, including chart review, counseling and assimilation of information, exam, discussion of plan, and chart completion.    Tinnie Gens  Valora Piccolo, MD

## 2021-06-24 NOTE — Addendum Note (Signed)
Addended by: Meredith Staggers R on: 06/24/2021 11:03 AM   Modules accepted: Level of Service

## 2021-07-28 ENCOUNTER — Other Ambulatory Visit: Payer: Self-pay

## 2021-07-28 ENCOUNTER — Other Ambulatory Visit: Payer: Self-pay | Admitting: Cardiology

## 2021-07-28 ENCOUNTER — Ambulatory Visit: Payer: Medicare Other

## 2021-07-28 DIAGNOSIS — I5042 Chronic combined systolic (congestive) and diastolic (congestive) heart failure: Secondary | ICD-10-CM

## 2021-08-05 IMAGING — DX DG CHEST 2V
2 series · 2 of 2 positions shown · non-contrast
Comparison: None.

CLINICAL DATA: Persistent cough, wheezing, history of CHF, heart
attack, [id number] several months prior

EXAM:
CHEST - 2 VIEW

[dg chest 2 view (1 of 2)]
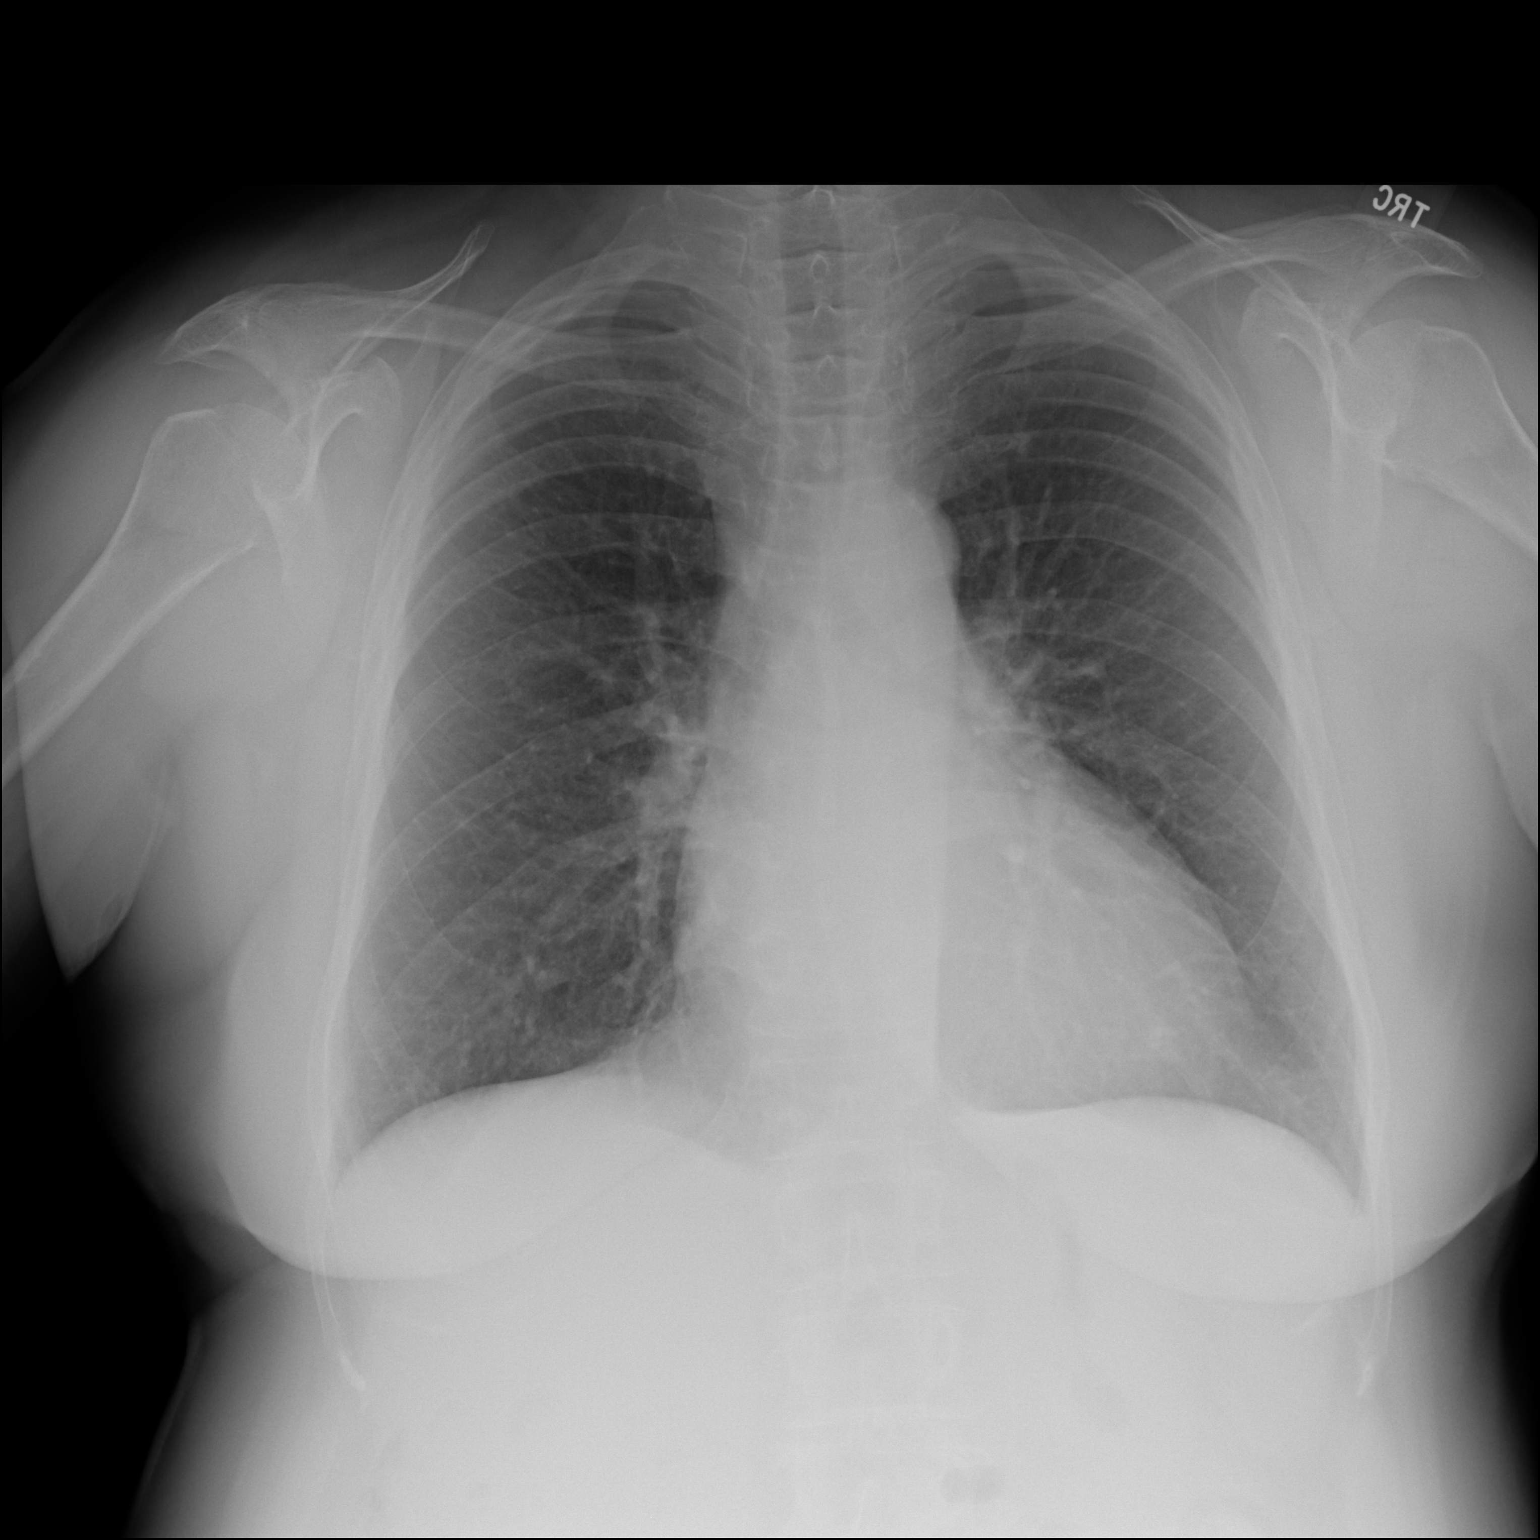

[dg chest 2 view (2 of 2)]
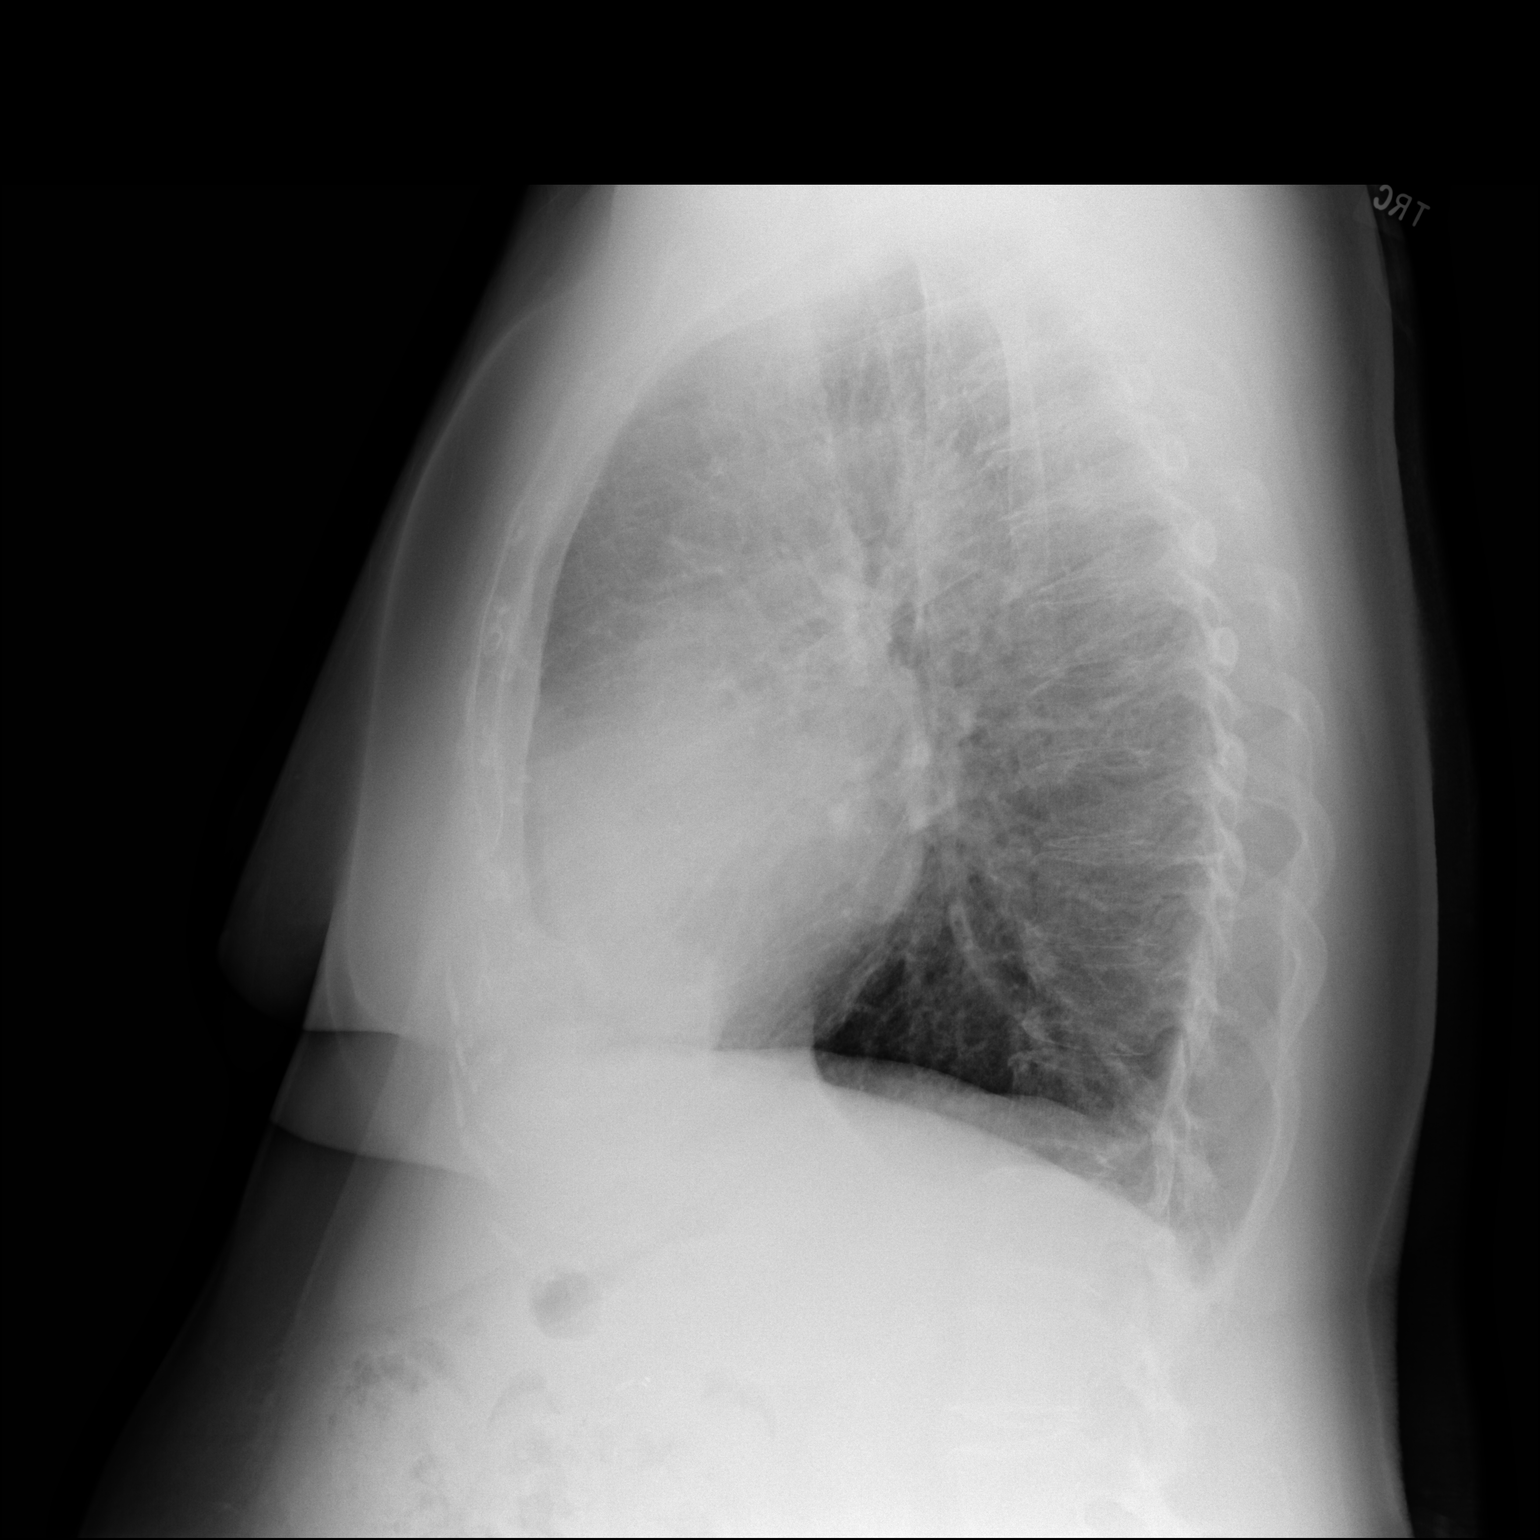

[2 of 2 positions shown; findings below may reference images not displayed]

FINDINGS: Some chronically coarsened perihilar markings. No features of frank
edema at this time. No pneumothorax. No visible effusion. No focal
consolidative process. The cardiomediastinal contours are
unremarkable. No acute osseous or soft tissue abnormality.
IMPRESSION: Acute abnormality.

Chronically coarsened perihilar opacities, similar to prior.

## 2021-08-07 ENCOUNTER — Other Ambulatory Visit: Payer: Self-pay

## 2021-08-07 ENCOUNTER — Ambulatory Visit: Payer: Medicare Other | Admitting: Cardiology

## 2021-08-07 ENCOUNTER — Encounter: Payer: Self-pay | Admitting: Cardiology

## 2021-08-07 VITALS — BP 119/70 | HR 80 | Temp 98.0°F | Resp 16 | Ht 60.0 in | Wt 164.0 lb

## 2021-08-07 DIAGNOSIS — I5042 Chronic combined systolic (congestive) and diastolic (congestive) heart failure: Secondary | ICD-10-CM

## 2021-08-07 DIAGNOSIS — Z794 Long term (current) use of insulin: Secondary | ICD-10-CM

## 2021-08-07 DIAGNOSIS — I428 Other cardiomyopathies: Secondary | ICD-10-CM

## 2021-08-07 DIAGNOSIS — I10 Essential (primary) hypertension: Secondary | ICD-10-CM

## 2021-08-07 DIAGNOSIS — I2510 Atherosclerotic heart disease of native coronary artery without angina pectoris: Secondary | ICD-10-CM

## 2021-08-07 DIAGNOSIS — I447 Left bundle-branch block, unspecified: Secondary | ICD-10-CM

## 2021-08-07 DIAGNOSIS — E7800 Pure hypercholesterolemia, unspecified: Secondary | ICD-10-CM

## 2021-08-07 DIAGNOSIS — E1165 Type 2 diabetes mellitus with hyperglycemia: Secondary | ICD-10-CM

## 2021-08-07 DIAGNOSIS — R0609 Other forms of dyspnea: Secondary | ICD-10-CM

## 2021-08-07 DIAGNOSIS — F17210 Nicotine dependence, cigarettes, uncomplicated: Secondary | ICD-10-CM

## 2021-08-07 DIAGNOSIS — R0600 Dyspnea, unspecified: Secondary | ICD-10-CM

## 2021-08-07 NOTE — Progress Notes (Signed)
Date:  08/07/2021   ID:  Felicia Hanson, DOB 1956/09/10, MRN [id number]  PCP:  Shade FloodGreene, Jeffrey R, MD  Cardiologist:  Tessa LernerSunit Rayquan Amrhein, DO, [facility] (established care 04/22/2020)  Date: 08/07/21 Last Office Visit: 05/06/2021  Chief Complaint  Patient presents with   Chronic combined systolic and diastolic heart failure   Hypertension   Follow-up    3 month    HPI   Felicia Hanson is a 65 y.o. female who presents to the office with a  chief complaint of " 5040-month evaluation for congestive heart failure."  Her past medical history and cardiovascular risk factors are: COVID-19 infection, insulin-dependent type 2 diabetes with hyperglycemia, pure hypercholesterolemia, hypertriglyceridemia, coronary artery calcification, nonischemic cardiomyopathy, left bundle branch block, essential hypertension, hypothyroidism, tobacco use disorder, vitamin D deficiency, generalized anxiety disorder.  She presents today for 2040-month follow-up for congestive heart failure management.  Patient is accompanied by her Felicia Hanson at today's office visit.  Patient provides verbal consent with regards to discussing her medical information in the presence of her sister.  Since last office visit patient is doing well from a cardiovascular standpoint.  No hospitalizations or urgent care visits for cardiovascular symptoms.  Patient's weight remains relatively stable.  She is on guideline directed medical therapy.  She had a repeat echocardiogram since last office encounter which notes mild improvement in LVEF from 35-40% to 40-45%.  Unfortunately, she continues to smoke 2-4 cigarettes per day.  FUNCTIONAL STATUS: No structured exercise program or daily routine.  But does actively take care of her grandkids.  ALLERGIES: Allergies  Allergen Reactions   Metformin And Related Anaphylaxis    Could not swallow   Nicotine Rash    Only allergic to nicotine patch   Ace Inhibitors Cough    Cough with lisinopril.      MEDICATION LIST PRIOR TO VISIT: Current Meds  Medication Sig   ACCU-CHEK AVIVA PLUS test strip daily. for testing   albuterol (VENTOLIN HFA) 108 (90 Base) MCG/ACT inhaler Inhale 1 puff into the lungs every 6 (six) hours as needed for wheezing or shortness of breath.   ALPRAZolam (XANAX) 1 MG tablet Take 0.5 mg by mouth 3 (three) times daily as needed for anxiety or sleep.    aspirin 81 MG tablet Take 81 mg by mouth daily.   BLACK ELDERBERRY PO Take 10 mLs by mouth daily.   cetirizine (ZYRTEC) 10 MG tablet Take 10 mg by mouth daily.   Continuous Blood Gluc Receiver (DEXCOM G6 RECEIVER) DEVI 1 Device by Does not apply route every morning. Device and supplies   dapagliflozin propanediol (FARXIGA) 10 MG TABS tablet Take 1 tablet (10 mg total) by mouth daily.   DULoxetine (CYMBALTA) 60 MG capsule TAKE 2 CAPSULES BY MOUTH DAILY   ezetimibe (ZETIA) 10 MG tablet TAKE 1 TABLET BY MOUTH EVERY DAY   famotidine (PEPCID) 20 MG tablet Take 20 mg by mouth daily as needed for heartburn or indigestion.   insulin glargine (LANTUS SOLOSTAR) 100 UNIT/ML Solostar Pen Inject 42 Units into the skin daily.   Insulin Pen Needle 31G X 5 MM MISC Inject 1 each into the skin daily.   levothyroxine (SYNTHROID) 88 MCG tablet TAKE 1 TABLET BY MOUTH DAILY BEFORE BREAKFAST.   magnesium oxide (MAG-OX) 400 (240 Mg) MG tablet TAKE 0.5 TABLETS (200 MG TOTAL) BY MOUTH 2 TIMES DAILY.   metoprolol succinate (TOPROL-XL) 100 MG 24 hr tablet Take 1 tablet (100 mg total) by mouth daily. Take with or immediately  following a meal.   naproxen sodium (ALEVE) 220 MG tablet Take 220 mg by mouth daily as needed.   nitroGLYCERIN (NITROSTAT) 0.4 MG SL tablet PLACE 1 TABLET UNDER THE TONGUE EVERY 5 MINUTES AS NEEDED FOR CHEST PAIN. IF YOU REQUIRE MORE THAN TWO TABLETS FIVE MINUTES APART GO TO THE NEAREST ER VIA EMS. (Patient taking differently: Place 0.4 mg under the tongue every 5 (five) minutes as needed for chest pain.)   ondansetron  (ZOFRAN ODT) 4 MG disintegrating tablet Take 1 tablet (4 mg total) by mouth every 8 (eight) hours as needed for nausea or vomiting.   pravastatin (PRAVACHOL) 80 MG tablet Take 1 tablet (80 mg total) by mouth every evening.   sacubitril-valsartan (ENTRESTO) 97-103 MG Take 1 tablet by mouth 2 (two) times daily.   spironolactone (ALDACTONE) 25 MG tablet TAKE 1 TABLET BY MOUTH EVERY DAY   Vitamin D, Ergocalciferol, (DRISDOL) 1.25 MG (50000 UNIT) CAPS capsule TAKE 1 CAPSULE (50,000 UNITS TOTAL) BY MOUTH EVERY 7 (SEVEN) DAYS     PAST MEDICAL HISTORY: Past Medical History:  Diagnosis Date   Anxiety    Coronary artery calcification    Depression    Diabetes mellitus without complication (HCC)    Hyperlipidemia    Hypertension    Thyroid disease     PAST SURGICAL HISTORY: Past Surgical History:  Procedure Laterality Date   BREAST EXCISIONAL BIOPSY Left    CHOLECYSTECTOMY     COLON SURGERY     diverticulitis with perforation; s/p colon resection.   Colonoscopy     DENTAL SURGERY     RIGHT/LEFT HEART CATH AND CORONARY ANGIOGRAPHY N/A 01/22/2021   Procedure: RIGHT/LEFT HEART CATH AND CORONARY ANGIOGRAPHY;  Surgeon: Yates Decamp, MD;  Location: [facility];  Service: Cardiovascular;  Laterality: N/A;   TUBAL LIGATION      FAMILY HISTORY: The patient family history includes Breast cancer in her paternal aunt; Cancer in her mother; Hyperlipidemia in her mother; Other in her father.  SOCIAL HISTORY:  The patient  reports that she has been smoking cigarettes. She has never used smokeless tobacco. She reports that she does not drink alcohol and does not use drugs.  REVIEW OF SYSTEMS: Review of Systems  Constitutional: Negative for chills and fever.  HENT:  Negative for hoarse voice and nosebleeds.   Eyes:  Negative for discharge, double vision and pain.  Cardiovascular:  Negative for chest pain, claudication, dyspnea on exertion, leg swelling, near-syncope, orthopnea, palpitations,  paroxysmal nocturnal dyspnea and syncope.  Respiratory:  Negative for hemoptysis and shortness of breath.   Musculoskeletal:  Negative for muscle cramps and myalgias.  Gastrointestinal:  Negative for abdominal pain, constipation, diarrhea, hematemesis, hematochezia, melena, nausea and vomiting.  Neurological:  Negative for dizziness and light-headedness.   PHYSICAL EXAM: Vitals with BMI 08/07/2021 05/21/2021 05/06/2021  Height 5\' 0"  5\' 0"  -  Weight 164 lbs 170 lbs 3 oz -  BMI 32.03 33.24 -  Systolic 119 130  Diastolic 70 78 77  Pulse 80 72 86    CONSTITUTIONAL: Well-developed and well-nourished. No acute distress.  SKIN: Skin is warm and dry. No rash noted. No cyanosis. No pallor. No jaundice HEAD: Normocephalic and atraumatic.  EYES: No scleral icterus MOUTH/THROAT: Moist oral membranes.  NECK: No JVD present. No thyromegaly noted. No carotid bruits  LYMPHATIC: No visible cervical adenopathy.  CHEST Normal respiratory effort. No intercostal retractions  LUNGS: Clear to auscultation bilaterally.  No stridor. No wheezes. No rales.  CARDIOVASCULAR: Regular  rate and rhythm, positive S1-S2, no murmurs rubs or gallops appreciated. ABDOMINAL: Obese, soft, nontender, nondistended, positive bowel sounds in all 4 quadrants.  No apparent ascites.  EXTREMITIES: No peripheral edema  HEMATOLOGIC: No significant bruising NEUROLOGIC: Oriented to person, place, and time. Nonfocal. Normal muscle tone.  PSYCHIATRIC: Normal mood and affect. Normal behavior. Cooperative  CARDIAC DATABASE:  EKG:  08/07/2021: Normal sinus rhythm, 77 bpm, left bundle branch block, left anterior fascicular block, left axis, without underlying injury pattern.   Coronary calcium scoring 05/13/2015: LAD: 8, Cx: 2. Total calcium score is 10.   Echocardiogram: 01/20/2021: LVEF 35-40%  05/15/2021: Technically difficult study. Left ventricle cavity is normal in size. Moderate concentric hypertrophy of the left ventricle.  Mild global hypokinesis. LVEF 40-45%. Indeterminate diastolic filling pattern.  No significant valvular abnormality. Normal right atrial pressure.  Compared to previous study on 04/23/2020, LVEF is marginally improved form 35-40%.  Stress Testing: Lexiscan Tetrofosmin Stress Test  05/13/2020:  There is a fixed mild defect in the inferior region due to diaphragmatic attenuation. No ischemia or scar.  The LV is moderately dilated with LV end diastolic volume of 173 mL.  Overall LV systolic function is abnormal without regional wall motion abnormalities. Stress LV EF: 21%.   High risk study due to low LVEF. Findings suggest non ischemic dilated cardiomyopathy.  Heart Catheterization: Right + left heart catheterization 01/22/2021: RA 16/12, mean 11 mmHg, RA saturation 80%. RV 47/8, EDP 15 mmHg. PA 41/26, mean 33 mmHg.  PA saturation 77%. PW 32/26, mean 29 mmHg.  Aortic saturation 98%. Cardiac output 6.58, cardiac index 3.80 by Fick.  Stroke-volume 70 mL.  SVR 16.87, PVR 1.05.  QP/QS 0.86. LV: Global hypokinesis, EF 35%.  EDP markedly elevated at 32 mmHg.  There was no pressure gradient across the aortic valve. Left main: Mild disease. LAD: Mild diffuse disease. CX: Mild diffuse disease.  I was told he was 11 something right RI: Mild diffuse disease. RCA: Dominant, mild diffuse disease.   Impression: Nonischemic cardiomyopathy with severe LV systolic dysfunction.  Mild to moderate pulmonary hypertension secondary to diastolic dysfunction with LV EDP elevation.  LABORATORY DATA: CBC Latest Ref Rng & Units 02/21/2021 01/24/2021 01/23/2021  WBC 3.4 - 10.8 x10E3/uL 11.3(H) 15.3(H) 13.9(H)  Hemoglobin 11.1 - 15.9 g/dL 24.2 15.7(H) 15.2(H)  Hematocrit 34.0 - 46.6 % 47.5(H) 44.9 44.3  Platelets 150 - 450 x10E3/uL 194 221 201    CMP Latest Ref Rng & Units 05/05/2021 02/21/2021 02/12/2021  Glucose 70 - 99 mg/dL 683(M) 196(Q) 229(NL)  BUN 6 - 23 mg/dL 11 15 12   Creatinine 0.40 - 1.20 mg/dL  8.92) 1.19(E)  Sodium 135 - 145 mEq/L 136 137 131(L)  Potassium 3.5 - 5.1 mEq/L 3.9 5.9(H) 4.6  Chloride 96 - 112 mEq/L 99 91(L) 89(L)  CO2 19 - 32 mEq/L 26 21 19(L)  Calcium 8.4 - 10.5 mg/dL 9.4 9.9 9.5  Total Protein 6.0 - 8.3 g/dL 7.2 - -  Total Bilirubin 0.2 - 1.2 mg/dL 0.5 - -  Alkaline Phos 39 - 117 U/L 82 - -  AST 0 - 37 U/L 16 - -  ALT 0 - 35 U/L 9 - -    Lipid Panel     Component Value Date/Time   CHOL 130 05/05/2021 1558   CHOL 153 12/06/2020 1638   TRIG 118.0 05/05/2021 1558   HDL 44.50 05/05/2021 1558   HDL 46 12/06/2020 1638   CHOLHDL 3 05/05/2021 1558   VLDL 23.6 05/05/2021 1558  LDLCALC 62 05/05/2021 1558   LDLCALC 80 12/06/2020 1638   LABVLDL 27 12/06/2020 1638   Lipid Panel Recent Labs    12/06/20 1638 05/05/21 1558  CHOL 153 130  TRIG 158* 118.0  LDLCALC 80 62  VLDL  --  23.6  HDL 46 44.50  CHOLHDL 3.3 3    Lab Results  Component Value Date   HGBA1C 11.9 (H) 05/05/2021   HGBA1C 6.8 (H) 12/06/2020   HGBA1C 9.1 (A) 09/06/2020   No components found for: NTPROBNP Lab Results  Component Value Date   TSH 0.294 (L) 01/20/2021   TSH 1.160 09/06/2020   TSH 0.732 04/03/2020    IMPRESSION:    ICD-10-CM   1. Chronic combined systolic and diastolic heart failure (HCC)  Z61.09 EKG 12-Lead    Pro b natriuretic peptide (BNP)    Basic metabolic panel    Magnesium    2. Nonischemic cardiomyopathy (HCC)  I42.8 Pro b natriuretic peptide (BNP)    Basic metabolic panel    Magnesium    3. Dyspnea on exertion  R06.00     4. Coronary artery calcification seen on computed tomography  I25.10     5. LBBB (left bundle branch block)  I44.7     6. Essential hypertension, benign  I10     7. Type 2 diabetes mellitus with hyperglycemia, with long-term current use of insulin (HCC)  E11.65    Z79.4     8. Long-term insulin use (HCC)  Z79.4     9. Hypercholesterolemia  E78.00     10. Cigarette smoker  F17.210        RECOMMENDATIONS: JANDA CARGO  is a 65 y.o. female whose past medical history and cardiac risk factors include:  insulin-dependent type 2 diabetes with hyperglycemia, pure hypercholesterolemia, hypertriglyceridemia, coronary artery calcification, nonischemic cardiomyopathy, left bundle branch block, essential hypertension, hypothyroidism, tobacco use disorder, vitamin D deficiency, generalized anxiety disorder.  Chronic combined systolic and diastolic heart failure, stage C, NYHA class II: Last hospitalization for congestive heart failure February 2022. Asymptomatic currently. Medications reconciled. Most recent echocardiogram results reviewed, LVEF mildly improved as noted above. Strict I's and O's, daily weights. Patient has enrolled into principal care management for congestive heart failure and her weight remains relatively stable. No changes in medical therapy.  Nonischemic cardiomyopathy: See plan above  Calcified atherosclerotic coronary artery disease:  Continue aspirin and statin therapy.   Educated on importance of secondary prevention.   Educated on the importance of complete smoking cessation.   We will continue to follow.  Left bundle branch block: Monitor for now.   Non-insulin-dependent diabetes mellitus type 2:  Most recent hemoglobin A1c reviewed.  Currently managed per primary team.  Benign essential hypertension:  Currently managed by primary provider.  Educated on importance of low-salt diet.  Active tobacco use:  Improving. Tobacco cessation counseling: Currently smoking 2-4 packs/day   Patient was informed of the dangers of tobacco abuse including stroke, cancer, and MI, as well as benefits of tobacco cessation. Patient is willing to quit at this time. 7 mins were spent counseling patient cessation techniques. We discussed various methods to help quit smoking, including deciding on a date to quit, joining a support group, pharmacological agents- nicotine gum/patch/lozenges.  I will reassess  her progress at the next follow-up visit  From a cardiovascular standpoint patient is relatively stable and recommended a 54-month follow-up for congestive heart failure management.  However, patient requested 52-month visit.  FINAL MEDICATION LIST END OF ENCOUNTER:  No orders of the defined types were placed in this encounter.   Medications Discontinued During This Encounter  Medication Reason   fluticasone (FLOVENT HFA) 44 MCG/ACT inhaler Error     Current Outpatient Medications:    ACCU-CHEK AVIVA PLUS test strip, daily. for testing, Disp: , Rfl: 2   albuterol (VENTOLIN HFA) 108 (90 Base) MCG/ACT inhaler, Inhale 1 puff into the lungs every 6 (six) hours as needed for wheezing or shortness of breath., Disp: , Rfl:    ALPRAZolam (XANAX) 1 MG tablet, Take 0.5 mg by mouth 3 (three) times daily as needed for anxiety or sleep. , Disp: , Rfl:    aspirin 81 MG tablet, Take 81 mg by mouth daily., Disp: , Rfl:    BLACK ELDERBERRY PO, Take 10 mLs by mouth daily., Disp: , Rfl:    cetirizine (ZYRTEC) 10 MG tablet, Take 10 mg by mouth daily., Disp: , Rfl:    Continuous Blood Gluc Receiver (DEXCOM G6 RECEIVER) DEVI, 1 Device by Does not apply route every morning. Device and supplies, Disp: 1 each, Rfl: 0   dapagliflozin propanediol (FARXIGA) 10 MG TABS tablet, Take 1 tablet (10 mg total) by mouth daily., Disp: 30 tablet, Rfl: 3   DULoxetine (CYMBALTA) 60 MG capsule, TAKE 2 CAPSULES BY MOUTH DAILY, Disp: 180 capsule, Rfl: 1   ezetimibe (ZETIA) 10 MG tablet, TAKE 1 TABLET BY MOUTH EVERY DAY, Disp: 90 tablet, Rfl: 0   famotidine (PEPCID) 20 MG tablet, Take 20 mg by mouth daily as needed for heartburn or indigestion., Disp: , Rfl:    insulin glargine (LANTUS SOLOSTAR) 100 UNIT/ML Solostar Pen, Inject 42 Units into the skin daily., Disp: 15 mL, Rfl: 5   Insulin Pen Needle 31G X 5 MM MISC, Inject 1 each into the skin daily., Disp: 100 each, Rfl: 2   levothyroxine (SYNTHROID) 88 MCG tablet, TAKE 1 TABLET BY  MOUTH DAILY BEFORE BREAKFAST., Disp: 90 tablet, Rfl: 1   magnesium oxide (MAG-OX) 400 (240 Mg) MG tablet, TAKE 0.5 TABLETS (200 MG TOTAL) BY MOUTH 2 TIMES DAILY., Disp: 30 tablet, Rfl: 1   metoprolol succinate (TOPROL-XL) 100 MG 24 hr tablet, Take 1 tablet (100 mg total) by mouth daily. Take with or immediately following a meal., Disp: 90 tablet, Rfl: 2   naproxen sodium (ALEVE) 220 MG tablet, Take 220 mg by mouth daily as needed., Disp: , Rfl:    nitroGLYCERIN (NITROSTAT) 0.4 MG SL tablet, PLACE 1 TABLET UNDER THE TONGUE EVERY 5 MINUTES AS NEEDED FOR CHEST PAIN. IF YOU REQUIRE MORE THAN TWO TABLETS FIVE MINUTES APART GO TO THE NEAREST ER VIA EMS. (Patient taking differently: Place 0.4 mg under the tongue every 5 (five) minutes as needed for chest pain.), Disp: 25 tablet, Rfl: 1   ondansetron (ZOFRAN ODT) 4 MG disintegrating tablet, Take 1 tablet (4 mg total) by mouth every 8 (eight) hours as needed for nausea or vomiting., Disp: 20 tablet, Rfl: 1   pravastatin (PRAVACHOL) 80 MG tablet, Take 1 tablet (80 mg total) by mouth every evening., Disp: 90 tablet, Rfl: 1   sacubitril-valsartan (ENTRESTO) 97-103 MG, Take 1 tablet by mouth 2 (two) times daily., Disp: 60 tablet, Rfl: 3   spironolactone (ALDACTONE) 25 MG tablet, TAKE 1 TABLET BY MOUTH EVERY DAY, Disp: 90 tablet, Rfl: 1   Vitamin D, Ergocalciferol, (DRISDOL) 1.25 MG (50000 UNIT) CAPS capsule, TAKE 1 CAPSULE (50,000 UNITS TOTAL) BY MOUTH EVERY 7 (SEVEN) DAYS, Disp: 15 capsule, Rfl: 0  Orders Placed This Encounter  Procedures   Pro b natriuretic peptide (BNP)   Basic metabolic panel   Magnesium   EKG 12-Lead   --Continue cardiac medications as reconciled in final medication list. --Return in about 3 months (around 11/06/2021) for Follow up, heart failure management.. Or sooner if needed. --Continue follow-up with your primary care physician regarding the management of your other chronic comorbid conditions.  Patient's questions and concerns  were addressed to her satisfaction. She voices understanding of the instructions provided during this encounter.   This note was created using a voice recognition software as a result there may be grammatical errors inadvertently enclosed that do not reflect the nature of this encounter. Every attempt is made to correct such errors.  Total time spent: 31 minutes.  Tessa Lerner, [place], [facility]  Pager: [phone] Office: [phone]

## 2021-08-11 ENCOUNTER — Other Ambulatory Visit: Payer: Self-pay | Admitting: Cardiology

## 2021-08-11 DIAGNOSIS — I5042 Chronic combined systolic (congestive) and diastolic (congestive) heart failure: Secondary | ICD-10-CM

## 2021-08-12 ENCOUNTER — Other Ambulatory Visit: Payer: Self-pay | Admitting: Family Medicine

## 2021-08-12 DIAGNOSIS — R7989 Other specified abnormal findings of blood chemistry: Secondary | ICD-10-CM

## 2021-08-15 ENCOUNTER — Other Ambulatory Visit: Payer: Self-pay

## 2021-08-15 DIAGNOSIS — I428 Other cardiomyopathies: Secondary | ICD-10-CM

## 2021-08-15 DIAGNOSIS — I5042 Chronic combined systolic (congestive) and diastolic (congestive) heart failure: Secondary | ICD-10-CM

## 2021-08-27 ENCOUNTER — Ambulatory Visit: Payer: Self-pay | Admitting: Neurology

## 2021-09-03 ENCOUNTER — Ambulatory Visit: Payer: Medicare Other | Admitting: Family Medicine

## 2021-09-05 ENCOUNTER — Ambulatory Visit: Payer: Medicare Other | Admitting: Registered Nurse

## 2021-09-11 ENCOUNTER — Ambulatory Visit (INDEPENDENT_AMBULATORY_CARE_PROVIDER_SITE_OTHER): Payer: Medicare Other | Admitting: Registered Nurse

## 2021-09-11 ENCOUNTER — Encounter: Payer: Self-pay | Admitting: Registered Nurse

## 2021-09-11 ENCOUNTER — Other Ambulatory Visit: Payer: Self-pay

## 2021-09-11 VITALS — BP 119/71 | HR 68 | Temp 97.6°F | Resp 18 | Ht 60.0 in | Wt 167.2 lb

## 2021-09-11 DIAGNOSIS — E661 Drug-induced obesity: Secondary | ICD-10-CM

## 2021-09-11 DIAGNOSIS — Z23 Encounter for immunization: Secondary | ICD-10-CM | POA: Diagnosis not present

## 2021-09-11 DIAGNOSIS — E1165 Type 2 diabetes mellitus with hyperglycemia: Secondary | ICD-10-CM

## 2021-09-11 DIAGNOSIS — Z1211 Encounter for screening for malignant neoplasm of colon: Secondary | ICD-10-CM | POA: Diagnosis not present

## 2021-09-11 DIAGNOSIS — Z6832 Body mass index (BMI) 32.0-32.9, adult: Secondary | ICD-10-CM

## 2021-09-11 DIAGNOSIS — F418 Other specified anxiety disorders: Secondary | ICD-10-CM

## 2021-09-11 LAB — POCT GLYCOSYLATED HEMOGLOBIN (HGB A1C): Hemoglobin A1C: 13.1 % — AB (ref 4.0–5.6)

## 2021-09-11 LAB — GLUCOSE, POCT (MANUAL RESULT ENTRY): POC Glucose: 256 mg/dl — AB (ref 70–99)

## 2021-09-11 LAB — HM COLONOSCOPY

## 2021-09-11 MED ORDER — DULAGLUTIDE 1.5 MG/0.5ML ~~LOC~~ SOAJ: 1.5000 mg | SUBCUTANEOUS | 0 refills | Status: DC

## 2021-09-11 MED ORDER — LANTUS SOLOSTAR 100 UNIT/ML ~~LOC~~ SOPN: 52.0000 [IU] | PEN_INJECTOR | Freq: Every day | SUBCUTANEOUS | 5 refills | Status: DC

## 2021-09-11 NOTE — Progress Notes (Signed)
Established Patient Office Visit  Subjective:  Patient ID: Felicia Hanson, female    DOB: Dec 18, 1955  Age: 65 y.o. MRN: [id number]  CC:  Chief Complaint  Patient presents with   Follow-up    Patient states she is here for a follow up for diabetes. And patient would also like to discuss her weight.    HPI Felicia Hanson presents for t2dm and weight  Last A1c:  Lab Results  Component Value Date   HGBA1C 11.9 (H) 05/05/2021   Glucose: 256 Currently taking: lantus 48 units nightly, farxiga $RemoveBeforeDE'10mg'xJSsEIusJtlMcKO$  po qd, No new complications Reports good compliance with medications Diet has been stable Exercise habits have been limited  Has been on Trulicity in the past with good effect. Stopped during hospitalization. It looks like instructions per hospitalist were to resume trulicity on discharge but she did not.  Weight Concern about this. Notes her husband will no longer speak to her given her weight He does not help around the house and makes her feel very down  She is interested in restarting trulicity and other options   Past Medical History:  Diagnosis Date   Anxiety    Coronary artery calcification    Depression    Diabetes mellitus without complication ([place])    Hyperlipidemia    Hypertension    Thyroid disease     Past Surgical History:  Procedure Laterality Date   BREAST EXCISIONAL BIOPSY Left    CHOLECYSTECTOMY     COLON SURGERY     diverticulitis with perforation; s/p colon resection.   Colonoscopy     DENTAL SURGERY     RIGHT/LEFT HEART CATH AND CORONARY ANGIOGRAPHY N/A 01/22/2021   Procedure: RIGHT/LEFT HEART CATH AND CORONARY ANGIOGRAPHY;  Surgeon: Adrian Prows, MD;  Location: [facility];  Service: Cardiovascular;  Laterality: N/A;   TUBAL LIGATION      Family History  Problem Relation Age of Onset   Cancer Mother        unknown primary   Hyperlipidemia Mother    Other Father    Breast cancer Paternal Aunt     Social History   Socioeconomic History    Marital status: Married    Spouse name: Not on file   Number of children: 2   Years of education: some college   Highest education level: Not on file  Occupational History   Occupation: Retired  Tobacco Use   Smoking status: Every Day    Years: 38.00    Types: Cigarettes   Smokeless tobacco: Never   Tobacco comments:    2-5 A DAY  Vaping Use   Vaping Use: Never used  Substance and Sexual Activity   Alcohol use: No    Alcohol/week: 0.0 standard drinks   Drug use: No   Sexual activity: Not Currently  Other Topics Concern   Not on file  Social History Narrative   Marital status: married      Children:  2 children; 4 grandchildren      Lives: with husband, 2 granddaughters      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   Left-handed.   Caffeine use: 2.5 cups caffeine per day.   Social Determinants of Health   Financial Resource Strain: Low Risk    Difficulty of Paying Living Expenses: Not very hard  Food Insecurity: No Food Insecurity   Worried About Running  Out of Food in the Last Year: Never true   Ran Out of Food in the Last Year: Never true  Transportation Needs: No Transportation Needs   Lack of Transportation (Medical): No   Lack of Transportation (Non-Medical): No  Physical Activity: Not on file  Stress: Not on file  Social Connections: Not on file  Intimate Partner Violence: Not on file    Outpatient Medications Prior to Visit  Medication Sig Dispense Refill   ACCU-CHEK AVIVA PLUS test strip daily. for testing  2   albuterol (VENTOLIN HFA) 108 (90 Base) MCG/ACT inhaler Inhale 1 puff into the lungs every 6 (six) hours as needed for wheezing or shortness of breath.     ALPRAZolam (XANAX) 1 MG tablet Take 0.5 mg by mouth 3 (three) times daily as needed for anxiety or sleep.      aspirin 81 MG tablet Take 81 mg by mouth daily.     BLACK ELDERBERRY PO Take 10 mLs by mouth daily.     cetirizine (ZYRTEC)  10 MG tablet Take 10 mg by mouth daily.     Continuous Blood Gluc Receiver (DEXCOM G6 RECEIVER) DEVI 1 Device by Does not apply route every morning. Device and supplies 1 each 0   dapagliflozin propanediol (FARXIGA) 10 MG TABS tablet Take 1 tablet (10 mg total) by mouth daily. 30 tablet 3   DULoxetine (CYMBALTA) 60 MG capsule TAKE 2 CAPSULES BY MOUTH DAILY 180 capsule 1   ezetimibe (ZETIA) 10 MG tablet TAKE 1 TABLET BY MOUTH EVERY DAY 90 tablet 0   famotidine (PEPCID) 20 MG tablet Take 20 mg by mouth daily as needed for heartburn or indigestion.     insulin glargine (LANTUS SOLOSTAR) 100 UNIT/ML Solostar Pen Inject 42 Units into the skin daily. 15 mL 5   Insulin Pen Needle 31G X 5 MM MISC Inject 1 each into the skin daily. 100 each 2   levothyroxine (SYNTHROID) 88 MCG tablet TAKE 1 TABLET BY MOUTH DAILY BEFORE BREAKFAST. 90 tablet 1   magnesium oxide (MAG-OX) 400 (240 Mg) MG tablet TAKE 0.5 TABLETS (200 MG TOTAL) BY MOUTH 2 TIMES DAILY. 90 tablet 1   metoprolol succinate (TOPROL-XL) 100 MG 24 hr tablet Take 1 tablet (100 mg total) by mouth daily. Take with or immediately following a meal. 90 tablet 2   naproxen sodium (ALEVE) 220 MG tablet Take 220 mg by mouth daily as needed.     nitroGLYCERIN (NITROSTAT) 0.4 MG SL tablet PLACE 1 TABLET UNDER THE TONGUE EVERY 5 MINUTES AS NEEDED FOR CHEST PAIN. IF YOU REQUIRE MORE THAN TWO TABLETS FIVE MINUTES APART GO TO THE NEAREST ER VIA EMS. (Patient taking differently: Place 0.4 mg under the tongue every 5 (five) minutes as needed for chest pain.) 25 tablet 1   ondansetron (ZOFRAN ODT) 4 MG disintegrating tablet Take 1 tablet (4 mg total) by mouth every 8 (eight) hours as needed for nausea or vomiting. 20 tablet 1   sacubitril-valsartan (ENTRESTO) 97-103 MG Take 1 tablet by mouth 2 (two) times daily. 60 tablet 3   spironolactone (ALDACTONE) 25 MG tablet TAKE 1 TABLET BY MOUTH EVERY DAY 90 tablet 1   Vitamin D, Ergocalciferol, (DRISDOL) 1.25 MG (50000 UNIT) CAPS  capsule TAKE 1 CAPSULE (50,000 UNITS TOTAL) BY MOUTH EVERY 7 (SEVEN) DAYS 13 capsule 1   pravastatin (PRAVACHOL) 80 MG tablet Take 1 tablet (80 mg total) by mouth every evening. 90 tablet 1   No facility-administered medications prior to visit.  Allergies  Allergen Reactions   Metformin And Related Anaphylaxis    Could not swallow   Nicotine Rash    Only allergic to nicotine patch   Ace Inhibitors Cough    Cough with lisinopril.     ROS Review of Systems  Constitutional: Negative.   HENT: Negative.    Eyes: Negative.   Respiratory: Negative.    Cardiovascular: Negative.   Gastrointestinal: Negative.   Genitourinary: Negative.   Musculoskeletal: Negative.   Skin: Negative.   Neurological: Negative.   Psychiatric/Behavioral: Negative.    All other systems reviewed and are negative.    Objective:    Physical Exam Vitals and nursing note reviewed.  Constitutional:      General: She is not in acute distress.    Appearance: Normal appearance. She is normal weight. She is not ill-appearing, toxic-appearing or diaphoretic.  Cardiovascular:     Rate and Rhythm: Normal rate and regular rhythm.     Heart sounds: Normal heart sounds. No murmur heard.   No friction rub. No gallop.  Pulmonary:     Effort: Pulmonary effort is normal. No respiratory distress.     Breath sounds: Normal breath sounds. No stridor. No wheezing, rhonchi or rales.  Chest:     Chest wall: No tenderness.  Skin:    General: Skin is warm and dry.  Neurological:     General: No focal deficit present.     Mental Status: She is alert and oriented to person, place, and time. Mental status is at baseline.  Psychiatric:        Mood and Affect: Mood normal.        Behavior: Behavior normal.        Thought Content: Thought content normal.        Judgment: Judgment normal.    BP 119/71   Pulse 68   Temp 97.6 F (36.4 C) (Temporal)   Resp 18   Ht 5' (1.524 m)   Wt 167 lb 3.2 oz (75.8 kg)   SpO2 99%    BMI 32.65 kg/m  Wt Readings from Last 3 Encounters:  09/11/21 167 lb 3.2 oz (75.8 kg)  08/07/21 164 lb (74.4 kg)  05/21/21 170 lb 3.2 oz (77.2 kg)     Health Maintenance Due  Topic Date Due   COLONOSCOPY (Pts 45-50yrs Insurance coverage will need to be confirmed)  Never done   Zoster Vaccines- Shingrix (1 of 2) Never done   DEXA SCAN  Never done   INFLUENZA VACCINE  06/30/2021    There are no preventive care reminders to display for this patient.  Lab Results  Component Value Date   TSH 0.294 (L) 01/20/2021   Lab Results  Component Value Date   WBC 11.3 (H) 02/21/2021   HGB 15.7 02/21/2021   HCT 47.5 (H) 02/21/2021   MCV 90 02/21/2021   PLT 194 02/21/2021   Lab Results  Component Value Date   NA 136 05/05/2021   K 3.9 05/05/2021   CO2 26 05/05/2021   GLUCOSE 167 (H) 05/05/2021   BUN 11 05/05/2021   CREATININE 0.97 05/05/2021   BILITOT 0.5 05/05/2021   ALKPHOS 82 05/05/2021   AST 16 05/05/2021   ALT 9 05/05/2021   PROT 7.2 05/05/2021   ALBUMIN 3.7 05/05/2021   CALCIUM 9.4 05/05/2021   ANIONGAP 11 01/28/2021   EGFR 37 (L) 02/21/2021   GFR 60.94 05/05/2021   Lab Results  Component Value Date   CHOL 130 05/05/2021  Lab Results  Component Value Date   HDL 44.50 05/05/2021   Lab Results  Component Value Date   LDLCALC 62 05/05/2021   Lab Results  Component Value Date   TRIG 118.0 05/05/2021   Lab Results  Component Value Date   CHOLHDL 3 05/05/2021   Lab Results  Component Value Date   HGBA1C 11.9 (H) 05/05/2021      Assessment & Plan:   Problem List Items Addressed This Visit   None Visit Diagnoses     Uncontrolled type 2 diabetes mellitus with hyperglycemia ([place])    -  Primary   Relevant Medications   Dulaglutide 1.5 MG/0.5ML SOPN   Other Relevant Orders   POCT glucose (manual entry)   Class 1 drug-induced obesity with serious comorbidity and body mass index (BMI) of 32.0 to 32.9 in adult       Relevant Medications   Dulaglutide  1.5 MG/0.5ML SOPN   Other Relevant Orders   Amb Ref to Medical Weight Management   Flu vaccine need       Relevant Orders   Flu Vaccine QUAD High Dose(Fluad)   Colon cancer screening       Relevant Orders   Ambulatory referral to Gastroenterology   Depression with anxiety       Relevant Orders   Ambulatory referral to Psychology       Meds ordered this encounter  Medications   Dulaglutide 1.5 MG/0.5ML SOPN    Sig: Inject 1.5 mg into the skin once a week.    Dispense:  6 mL    Refill:  0    Order Specific Question:   Supervising Provider    Answer:   Carlota Raspberry, JEFFREY R [[id number]]   insulin glargine (LANTUS SOLOSTAR) 100 UNIT/ML Solostar Pen    Sig: Inject 52 Units into the skin daily.    Dispense:  15 mL    Refill:  5    Order Specific Question:   Supervising Provider    Answer:   Carlota Raspberry, JEFFREY R [[id number]]    Follow-up: Return in about 6 weeks (around 10/23/2021) for PCP - T2dm.   PLAN A1c today to 13.1. increase lantus to 54 units nightly. Restart trulicity 1.5mg  weekly.  Refer to [facility], refer to counseling. Discussed healthy lifestyle Flu shot given Return in 6 weeks for med check with pcp Patient encouraged to call clinic with any questions, comments, or concerns.  I spent 42 minutes with this patient reviewing labs, counseling, arranging referrals, discussing medications.  Felicia Coss, NP

## 2021-09-11 NOTE — Patient Instructions (Addendum)
Ms. Gunderson -   Randie Heinz to meet you in person!  I have placed referrals to weight management and counseling.  I think weight can impact health - but I am not worried about aesthetics.   Restart trulicity  Increase lantus to 54 units nightly.  Return in 6 weeks to see Dr. Neva Seat   Thank you  Rich     If you have lab work done today you will be contacted with your lab results within the next 2 weeks.  If you have not heard from [place] then please contact us. The fastest way to get your results is to register for My Chart.   IF you received an x-ray today, you will receive an invoice from [facility]. Please contact [facility] at [phone] with questions or concerns regarding your invoice.   IF you received labwork today, you will receive an invoice from [facility]. Please contact [facility] at [phone] with questions or concerns regarding your invoice.   Our billing staff will not be able to assist you with questions regarding bills from these companies.  You will be contacted with the lab results as soon as they are available. The fastest way to get your results is to activate your My Chart account. Instructions are located on the last page of this paperwork. If you have not heard from [place] regarding the results in 2 weeks, please contact this office.

## 2021-09-13 ENCOUNTER — Other Ambulatory Visit: Payer: Self-pay | Admitting: Family Medicine

## 2021-09-17 ENCOUNTER — Telehealth: Payer: Self-pay | Admitting: Family Medicine

## 2021-09-17 NOTE — Telephone Encounter (Signed)
Filled out, placed up front for pick up by [name] per [name]. Spoke with Felicia Hanson who was very appreciative of the forms being done in such a timely manor

## 2021-09-17 NOTE — Telephone Encounter (Signed)
..  Type of form received:  Medication Assistance  Additional comments:   Received by:  Gus Height should be Faxed/mailed to: (address/ fax #) Give to patient  Is patient requesting call for pickup: yes  Form placed:  In Dr. Paralee Cancel box  Attach charge sheet.  Provider will determine charge.  Individual made aware of 3-5 business day turn around Yes?

## 2021-09-18 ENCOUNTER — Telehealth: Payer: Self-pay

## 2021-09-18 NOTE — Telephone Encounter (Signed)
Caller name:Marchell Dayton Scrape  On DPR? :Yes  Call back number:[phone]  Provider they see: Neva Seat  Reason for call:Pt is dropping off another Drug form for Dr Neva Seat to complete on the Trulicity wrong drug was filled on on the last form. Placed in Dr Neva Seat bin

## 2021-09-18 NOTE — Telephone Encounter (Signed)
Reviewed and redone with Trulicity, placed in Dr Haze Justin to be signed

## 2021-09-19 ENCOUNTER — Encounter: Payer: Self-pay | Admitting: Internal Medicine

## 2021-09-24 ENCOUNTER — Ambulatory Visit: Payer: Self-pay | Admitting: Neurology

## 2021-09-26 ENCOUNTER — Other Ambulatory Visit: Payer: Self-pay | Admitting: Student

## 2021-09-26 MED ORDER — SACUBITRIL-VALSARTAN 97-103 MG PO TABS: 1.0000 | ORAL_TABLET | Freq: Two times a day (BID) | ORAL | 0 refills | Status: DC

## 2021-10-30 ENCOUNTER — Ambulatory Visit: Payer: Medicare Other | Admitting: Family Medicine

## 2021-11-03 ENCOUNTER — Other Ambulatory Visit: Payer: Self-pay

## 2021-11-03 ENCOUNTER — Telehealth: Payer: Self-pay | Admitting: Family Medicine

## 2021-11-03 DIAGNOSIS — E1165 Type 2 diabetes mellitus with hyperglycemia: Secondary | ICD-10-CM

## 2021-11-03 MED ORDER — TRULICITY 1.5 MG/0.5ML ~~LOC~~ SOAJ
1.5000 mg | SUBCUTANEOUS | 1 refills | Status: DC
Start: 2021-11-03 — End: 2021-11-03

## 2021-11-03 MED ORDER — TRULICITY 1.5 MG/0.5ML ~~LOC~~ SOAJ: 1.5000 mg | SUBCUTANEOUS | 1 refills | Status: DC

## 2021-11-03 NOTE — Telephone Encounter (Signed)
..  Caller name:  Prabhleen Montemayor  On DPR? :yes/no: Yes  Call back number:[phone]  Provider they see: Neva Seat  Reason for call: Needs to know if her RX for trulicity was sent to [facility]

## 2021-11-03 NOTE — Telephone Encounter (Signed)
RX was sent to wrong Pharmacy sent to [facility] in [place] and it supposed to go to [facility] order

## 2021-11-03 NOTE — Telephone Encounter (Signed)
Corrected and patient aware

## 2021-11-03 NOTE — Progress Notes (Unsigned)
Per Felicia Hanson note 09/11/21 resume Trulicity 1.5 once weekly

## 2021-11-06 ENCOUNTER — Ambulatory Visit: Payer: Medicare Other | Admitting: Cardiology

## 2021-11-07 ENCOUNTER — Encounter: Payer: Self-pay | Admitting: Registered Nurse

## 2021-11-10 ENCOUNTER — Other Ambulatory Visit: Payer: Self-pay

## 2021-11-10 DIAGNOSIS — E1165 Type 2 diabetes mellitus with hyperglycemia: Secondary | ICD-10-CM

## 2021-11-10 MED ORDER — TRULICITY 1.5 MG/0.5ML ~~LOC~~ SOAJ: 1.5000 mg | SUBCUTANEOUS | 1 refills | Status: DC

## 2021-11-12 ENCOUNTER — Encounter: Payer: Medicare Other | Admitting: Internal Medicine

## 2021-11-14 ENCOUNTER — Telehealth: Payer: Self-pay | Admitting: Family Medicine

## 2021-11-14 DIAGNOSIS — E1165 Type 2 diabetes mellitus with hyperglycemia: Secondary | ICD-10-CM

## 2021-11-14 MED ORDER — TRULICITY 1.5 MG/0.5ML ~~LOC~~ SOAJ: 1.5000 mg | Freq: Once | SUBCUTANEOUS | 0 refills | Status: AC

## 2021-11-14 NOTE — Telephone Encounter (Signed)
No problem.  I sent 1 dose to [facility] [address]   Thanks

## 2021-11-14 NOTE — Telephone Encounter (Signed)
Pt called in stating that she isn't going to get Trulicity until Tuesday. She will going a wk without this. She is very concerned. She wanted to know if we could send in 1 shot to her location pharmacy.   Please advise   She would like a call back today.

## 2021-11-17 ENCOUNTER — Encounter: Payer: Self-pay | Admitting: Internal Medicine

## 2021-11-29 ENCOUNTER — Other Ambulatory Visit: Payer: Self-pay | Admitting: Family Medicine

## 2021-11-29 DIAGNOSIS — E7800 Pure hypercholesterolemia, unspecified: Secondary | ICD-10-CM

## 2021-12-03 ENCOUNTER — Encounter: Payer: Self-pay | Admitting: Family Medicine

## 2021-12-03 ENCOUNTER — Ambulatory Visit (INDEPENDENT_AMBULATORY_CARE_PROVIDER_SITE_OTHER): Payer: Medicare Other | Admitting: Family Medicine

## 2021-12-03 VITALS — BP 128/60 | HR 87 | Temp 98.2°F | Resp 17 | Ht 60.0 in | Wt 160.8 lb

## 2021-12-03 DIAGNOSIS — E1165 Type 2 diabetes mellitus with hyperglycemia: Secondary | ICD-10-CM | POA: Diagnosis not present

## 2021-12-03 DIAGNOSIS — R251 Tremor, unspecified: Secondary | ICD-10-CM

## 2021-12-03 DIAGNOSIS — E039 Hypothyroidism, unspecified: Secondary | ICD-10-CM | POA: Diagnosis not present

## 2021-12-03 DIAGNOSIS — G2581 Restless legs syndrome: Secondary | ICD-10-CM | POA: Diagnosis not present

## 2021-12-03 LAB — COMPREHENSIVE METABOLIC PANEL
ALT: 9 U/L (ref 0–35)
AST: 16 U/L (ref 0–37)
Albumin: 3.8 g/dL (ref 3.5–5.2)
Alkaline Phosphatase: 75 U/L (ref 39–117)
BUN: 13 mg/dL (ref 6–23)
CO2: 29 mEq/L (ref 19–32)
Calcium: 9.8 mg/dL (ref 8.4–10.5)
Chloride: 101 mEq/L (ref 96–112)
Creatinine, Ser: 1.25 mg/dL — ABNORMAL HIGH (ref 0.40–1.20)
GFR: 44.77 mL/min — ABNORMAL LOW (ref >60.00)
Glucose, Bld: 115 mg/dL — ABNORMAL HIGH (ref 70–99)
Potassium: 4.5 mEq/L (ref 3.5–5.1)
Sodium: 141 mEq/L (ref 135–145)
Total Bilirubin: 0.5 mg/dL (ref 0.2–1.2)
Total Protein: 7.2 g/dL (ref 6.0–8.3)

## 2021-12-03 LAB — LIPID PANEL
Cholesterol: 122 mg/dL (ref 0–200)
HDL: 39.3 mg/dL (ref >39.00)
LDL Cholesterol: 54 mg/dL (ref 0–99)
NonHDL: 82.58
Total CHOL/HDL Ratio: 3
Triglycerides: 143 mg/dL (ref 0.0–149.0)
VLDL: 28.6 mg/dL (ref 0.0–40.0)

## 2021-12-03 LAB — HEMOGLOBIN A1C: Hgb A1c MFr Bld: 6.7 % — ABNORMAL HIGH (ref 4.6–6.5)

## 2021-12-03 LAB — TSH: TSH: 0.22 u[IU]/mL — ABNORMAL LOW (ref 0.35–5.50)

## 2021-12-03 NOTE — Patient Instructions (Addendum)
I will refer you to endocrinologist for management of your diabetes, but can discuss other changes for now based on your labs today.   I will refer you to neurology to discuss the tremor.   Return to the clinic or go to the nearest emergency room if any of your symptoms worsen or new symptoms occur.  Call [facility] about questions about sleep apnea or device.

## 2021-12-03 NOTE — Progress Notes (Signed)
Subjective:  Patient ID: Felicia Hanson, female    DOB: Apr 01, 1956  Age: 66 y.o. MRN: [id number]  CC:  Chief Complaint  Patient presents with   Diabetes    Pt reports doing okay, cardiology has recommended she be seen by endocrinology    Tremors    Pt reports hand tremors as well as increased restless leg syndrome, okay with being referred to neurology.     HPI Felicia Hanson presents for   Diabetes: Uncontrolled with hyperglycemia.  Also complicated by microalbuminuria.,  She last saw me in June 2022 for her diabetes.  1 month follow-up advised at that time.   At her June visit recommended increasing her Lantus dose by 2 units every 3 days until readings below 200.  Also takes [place].  She is on statin and ARB.  Seen by my colleague October 13.  Lantus 48 units daily with Farxiga 10 mg daily at that time.  Had been on Trulicity previously, had been discontinued during hospitalization but not restarted as planned. Lantus was increased to 54 units nightly, restarted Trulicity 1.5mg  weekly at her last visit in October.  No symptomatic lows.  Home readings:  Fasting: 120's 2hr PP: 134 last night Some 200's at times, no 300's.  Has lost some weight.  Wt Readings from Last 3 Encounters:  12/03/21 160 lb 12.8 oz (72.9 kg)  09/11/21 167 lb 3.2 oz (75.8 kg)  08/07/21 164 lb (74.4 kg)  Feeling good overall - best she has for awhile.  Has OSA - not yet using machine - trying to coordinate machine with her insurance.   Lab Results  Component Value Date   HGBA1C 13.1 (A) 09/11/2021   HGBA1C 11.9 (H) 05/05/2021   HGBA1C 6.8 (H) 12/06/2020   Lab Results  Component Value Date   MICROALBUR 19.2 (H) 05/21/2021   LDLCALC 62 05/05/2021   CREATININE 0.97 05/05/2021   Hand tremors Not persistent, past year or two - notices with pushing buttons on phone. Sometimes at rest.  RLS for years - more when nervous.   History Patient Active Problem List   Diagnosis Date Noted   Shortness of  breath at rest 05/09/2021   Loud snoring 03/24/2021   Excessive daytime sleepiness 03/24/2021   Acute on chronic combined systolic and diastolic HF (heart failure) ([place]) 03/24/2021   Panlobular emphysema ([place]) 03/24/2021   GAD (generalized anxiety disorder) 03/24/2021   Atypical chest pain 01/22/2021   Respiratory distress 01/20/2021   Acute on chronic combined systolic and diastolic CHF (congestive heart failure) ([place]) 01/20/2021   Type 2 diabetes mellitus without complication, without long-term current use of insulin ([place]) 10/26/2017   Generalized anxiety disorder 01/10/2014   Vitamin D deficiency 01/10/2014   Essential hypertension, benign 01/10/2014   Pure hypercholesterolemia 01/10/2014   Hypothyroidism 01/10/2014   Past Medical History:  Diagnosis Date   Anxiety    Coronary artery calcification    Depression    Diabetes mellitus without complication ([place])    Hyperlipidemia    Hypertension    Thyroid disease    Past Surgical History:  Procedure Laterality Date   BREAST EXCISIONAL BIOPSY Left    CHOLECYSTECTOMY     COLON SURGERY     diverticulitis with perforation; s/p colon resection.   Colonoscopy     DENTAL SURGERY     RIGHT/LEFT HEART CATH AND CORONARY ANGIOGRAPHY N/A 01/22/2021   Procedure: RIGHT/LEFT HEART CATH AND CORONARY ANGIOGRAPHY;  Surgeon: Adrian Prows, MD;  Location: [facility];  Service: Cardiovascular;  Laterality: N/A;   TUBAL LIGATION     Allergies  Allergen Reactions   Metformin And Related Anaphylaxis    Could not swallow   Nicotine Rash    Only allergic to nicotine patch   Ace Inhibitors Cough    Cough with lisinopril.    Prior to Admission medications   Medication Sig Start Date End Date Taking? Authorizing Provider  ACCU-CHEK AVIVA PLUS test strip daily. for testing 03/11/18  Yes [provider]  albuterol (VENTOLIN HFA) 108 (90 Base) MCG/ACT inhaler Inhale 1 puff into the lungs every 6 (six) hours as needed for wheezing or  shortness of breath.   Yes [provider]  ALPRAZolam Duanne Moron) 1 MG tablet Take 0.5 mg by mouth 3 (three) times daily as needed for anxiety or sleep.  08/21/13  Yes [provider]  aspirin 81 MG tablet Take 81 mg by mouth daily.   Yes [provider]  BLACK ELDERBERRY PO Take 10 mLs by mouth daily.   Yes [provider]  cetirizine (ZYRTEC) 10 MG tablet Take 10 mg by mouth daily.   Yes [provider]  Continuous Blood Gluc Receiver (DEXCOM G6 RECEIVER) DEVI 1 Device by Does not apply route every morning. Device and supplies 04/18/20  Yes Wendie Agreste, MD  dapagliflozin propanediol (FARXIGA) 10 MG TABS tablet Take 1 tablet (10 mg total) by mouth daily. 01/25/21  Yes Regalado, Belkys A, MD  Dulaglutide 1.5 MG/0.5ML SOPN Inject 1.5 mg into the skin once a week. 09/11/21  Yes Maximiano Coss, NP  DULoxetine (CYMBALTA) 60 MG capsule TAKE 2 CAPSULES BY MOUTH DAILY 06/06/21  Yes Wendie Agreste, MD  ezetimibe (ZETIA) 10 MG tablet TAKE 1 TABLET BY MOUTH EVERY DAY 09/15/21  Yes Wendie Agreste, MD  famotidine (PEPCID) 20 MG tablet Take 20 mg by mouth daily as needed for heartburn or indigestion.   Yes [provider]  insulin glargine (LANTUS SOLOSTAR) 100 UNIT/ML Solostar Pen Inject 52 Units into the skin daily. 09/11/21  Yes Maximiano Coss, NP  Insulin Pen Needle 31G X 5 MM MISC Inject 1 each into the skin daily. 02/10/21  Yes Wendie Agreste, MD  levothyroxine (SYNTHROID) 88 MCG tablet TAKE 1 TABLET BY MOUTH DAILY BEFORE BREAKFAST. 06/16/21  Yes Wendie Agreste, MD  magnesium oxide (MAG-OX) 400 (240 Mg) MG tablet TAKE 0.5 TABLETS (200 MG TOTAL) BY MOUTH 2 TIMES DAILY. 08/11/21  Yes Tolia, Sunit, DO  metoprolol succinate (TOPROL-XL) 100 MG 24 hr tablet Take 1 tablet (100 mg total) by mouth daily. Take with or immediately following a meal. 02/25/21  Yes Tolia, Sunit, DO  naproxen sodium (ALEVE) 220 MG tablet Take 220 mg by mouth daily as needed.    Yes [provider]  nitroGLYCERIN (NITROSTAT) 0.4 MG SL tablet PLACE 1 TABLET UNDER THE TONGUE EVERY 5 MINUTES AS NEEDED FOR CHEST PAIN. IF YOU REQUIRE MORE THAN TWO TABLETS FIVE MINUTES APART GO TO THE NEAREST ER VIA EMS. Patient taking differently: Place 0.4 mg under the tongue every 5 (five) minutes as needed for chest pain. 07/29/20  Yes Tolia, Sunit, DO  ondansetron (ZOFRAN ODT) 4 MG disintegrating tablet Take 1 tablet (4 mg total) by mouth every 8 (eight) hours as needed for nausea or vomiting. 07/14/19  Yes Delia Chimes A, MD  pravastatin (PRAVACHOL) 80 MG tablet TAKE 1 TABLET BY MOUTH EVERY EVENING 11/29/21  Yes Wendie Agreste, MD  sacubitril-valsartan [facility]) [phone]  MG Take 1 tablet by mouth 2 (two) times daily. 09/26/21  Yes Cantwell, Celeste C, PA-C  spironolactone (ALDACTONE) 25 MG tablet TAKE 1 TABLET BY MOUTH EVERY DAY 06/19/21  Yes Wendie Agreste, MD  Vitamin D, Ergocalciferol, (DRISDOL) 1.25 MG (50000 UNIT) CAPS capsule TAKE 1 CAPSULE (50,000 UNITS TOTAL) BY MOUTH EVERY 7 (SEVEN) DAYS 08/13/21  Yes Wendie Agreste, MD   Social History   Socioeconomic History   Marital status: Married    Spouse name: Not on file   Number of children: 2   Years of education: some college   Highest education level: Not on file  Occupational History   Occupation: Retired  Tobacco Use   Smoking status: Every Day    Years: 38.00    Types: Cigarettes   Smokeless tobacco: Never   Tobacco comments:    2-5 A DAY  Vaping Use   Vaping Use: Never used  Substance and Sexual Activity   Alcohol use: No    Alcohol/week: 0.0 standard drinks   Drug use: No   Sexual activity: Not Currently  Other Topics Concern   Not on file  Social History Narrative   Marital status: married      Children:  2 children; 4 grandchildren      Lives: with husband, 2 granddaughters      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    Left-handed.   Caffeine use: 2.5 cups caffeine per day.   Social Determinants of Health   Financial Resource Strain: Low Risk    Difficulty of Paying Living Expenses: Not very hard  Food Insecurity: No Food Insecurity   Worried About Charity fundraiser in the Last Year: Never true   Ran Out of Food in the Last Year: Never true  Transportation Needs: No Transportation Needs   Lack of Transportation (Medical): No   Lack of Transportation (Non-Medical): No  Physical Activity: Not on file  Stress: Not on file  Social Connections: Not on file  Intimate Partner Violence: Not on file    Review of Systems  Constitutional:  Negative for fatigue and unexpected weight change.  Respiratory:  Negative for chest tightness and shortness of breath.   Cardiovascular:  Negative for chest pain, palpitations and leg swelling.  Gastrointestinal:  Negative for abdominal pain and blood in stool.  Neurological:  Negative for dizziness, syncope, light-headedness and headaches.    Objective:   Vitals:   12/03/21 1130  BP: 128/60  Pulse: 87  Resp: 17  Temp: 98.2 F (36.8 C)  TempSrc: Temporal  SpO2: 96%  Weight: 160 lb 12.8 oz (72.9 kg)  Height: 5' (1.524 m)     Physical Exam Vitals reviewed.  Constitutional:      Appearance: Normal appearance. She is well-developed.  HENT:     Head: Normocephalic and atraumatic.  Eyes:     Conjunctiva/sclera: Conjunctivae normal.     Pupils: Pupils are equal, round, and reactive to light.  Neck:     Vascular: No carotid bruit.  Cardiovascular:     Rate and Rhythm: Normal rate and regular rhythm.     Heart sounds: Normal heart sounds.  Pulmonary:     Effort: Pulmonary effort is normal.     Breath sounds: Normal breath sounds.  Abdominal:     Palpations: Abdomen is soft. There is no pulsatile mass.     Tenderness:  There is no abdominal tenderness.  Musculoskeletal:     Right lower leg: No edema.     Left lower leg: No edema.  Skin:    General:  Skin is warm and dry.  Neurological:     Mental Status: She is alert and oriented to person, place, and time.  Psychiatric:        Mood and Affect: Mood normal.        Behavior: Behavior normal.  Fine resting tremor left greater than right hand, slight unsteady finger-to-nose testing.  Normal lower extremity movements.     Assessment & Plan:  Felicia Hanson is a 66 y.o. female . Uncontrolled type 2 diabetes mellitus with hyperglycemia ([place]) - Plan: Ambulatory referral to Endocrinology, Comprehensive metabolic panel, Lipid panel, Hemoglobin A1c  -Uncontrolled diabetes with recent numbers heading in the right direction.  Unfortunately was lost to follow-up of diabetes last year, then visit in October.  Recheck A1c.  Continue Trulicity, Lantus at current doses until results reviewed.  Refer to endocrinology.  Tremor of both hands - Plan: Ambulatory referral to Neurology Reports features of resting and intention tremor.  Refer to neurology for further evaluation  RLS (restless legs syndrome)  -Reports increased symptoms recently.  Eval with neuro as above.  Hypothyroidism, unspecified type - Plan: TSH  -Check updated TSH, plan for follow-up next few weeks to review lab results as well as other chronic medical conditions/meds.  No orders of the defined types were placed in this encounter.  Patient Instructions  I will refer you to endocrinologist for management of your diabetes, but can discuss other changes for now based on your labs today.   I will refer you to neurology to discuss the tremor.   Return to the clinic or go to the nearest emergency room if any of your symptoms worsen or new symptoms occur.  [facility] about questions about sleep apnea or device.      Signed,   Merri Ray, MD [place], [facility] 12/03/21 12:08 PM

## 2021-12-09 ENCOUNTER — Telehealth: Payer: Self-pay

## 2021-12-09 ENCOUNTER — Ambulatory Visit: Payer: Medicare Other | Admitting: Cardiology

## 2021-12-09 ENCOUNTER — Other Ambulatory Visit: Payer: Self-pay

## 2021-12-09 ENCOUNTER — Encounter: Payer: Self-pay | Admitting: Cardiology

## 2021-12-09 VITALS — BP 136/79 | HR 83 | Temp 97.4°F | Resp 16 | Ht 60.0 in | Wt 165.4 lb

## 2021-12-09 DIAGNOSIS — E1165 Type 2 diabetes mellitus with hyperglycemia: Secondary | ICD-10-CM

## 2021-12-09 DIAGNOSIS — I5042 Chronic combined systolic (congestive) and diastolic (congestive) heart failure: Secondary | ICD-10-CM

## 2021-12-09 DIAGNOSIS — E7800 Pure hypercholesterolemia, unspecified: Secondary | ICD-10-CM

## 2021-12-09 DIAGNOSIS — Z794 Long term (current) use of insulin: Secondary | ICD-10-CM

## 2021-12-09 DIAGNOSIS — I447 Left bundle-branch block, unspecified: Secondary | ICD-10-CM

## 2021-12-09 DIAGNOSIS — I428 Other cardiomyopathies: Secondary | ICD-10-CM

## 2021-12-09 DIAGNOSIS — I2510 Atherosclerotic heart disease of native coronary artery without angina pectoris: Secondary | ICD-10-CM

## 2021-12-09 DIAGNOSIS — F17210 Nicotine dependence, cigarettes, uncomplicated: Secondary | ICD-10-CM

## 2021-12-09 DIAGNOSIS — I10 Essential (primary) hypertension: Secondary | ICD-10-CM

## 2021-12-09 NOTE — Telephone Encounter (Signed)
Pt returned phone call please call [phone]

## 2021-12-09 NOTE — Telephone Encounter (Signed)
Called pt no answer on home phone, LM asking pt to return call to the office to discuss

## 2021-12-09 NOTE — Progress Notes (Signed)
Date:  12/09/2021   ID:  Felicia Hanson, DOB [birth year], MRN [id number]  PCP:  Wendie Agreste, MD  Cardiologist:  Rex Kras, DO, [facility] (established care 04/22/2020)  Date: 12/09/21 Last Office Visit: 08/07/2021  Chief Complaint  Patient presents with   heart failure management   Follow-up    HPI   Felicia Hanson is a 66 y.o. female who presents to the office with a  chief complaint of " 61-month evaluation for congestive heart failure."  Her past medical history and cardiovascular risk factors are: COVID-19 infection, insulin-dependent type 2 diabetes with hyperglycemia, pure hypercholesterolemia, hypertriglyceridemia, coronary artery calcification, nonischemic cardiomyopathy, left bundle branch block, essential hypertension, hypothyroidism, tobacco use disorder, vitamin D deficiency, generalized anxiety disorder.  Patient presents for 60-month follow-up for congestive heart failure.  Her last heart failure hospitalization was in February 2022.  She has been optimized on guideline directed medical therapy in a stepwise fashion is tolerating the medication well is without any side effects or intolerances.  Since the last office visit she had recent lab work which notes a significantd event and her hemoglobin A1c for which she is congratulated for.  Her weight remains overall stable since last visit, denies orthopnea, paroxysmal nocturnal dyspnea or lower extremity swelling.  She is requesting patient assistance where Wilder Glade and Delene Loll since is the first the year.  Unfortunately she continues to smoke but no more than 4 cigarettes/day.  FUNCTIONAL STATUS: No structured exercise program or daily routine.  But does actively take care of her grandkids.  ALLERGIES: Allergies  Allergen Reactions   Metformin And Related Anaphylaxis    Could not swallow   Nicotine Rash    Only allergic to nicotine patch   Ace Inhibitors Cough    Cough with lisinopril.     MEDICATION LIST PRIOR TO  VISIT: Current Meds  Medication Sig   ACCU-CHEK AVIVA PLUS test strip daily. for testing   albuterol (VENTOLIN HFA) 108 (90 Base) MCG/ACT inhaler Inhale 1 puff into the lungs every 6 (six) hours as needed for wheezing or shortness of breath.   ALPRAZolam (XANAX) 1 MG tablet Take 0.5 mg by mouth 3 (three) times daily as needed for anxiety or sleep.    aspirin 81 MG tablet Take 81 mg by mouth daily.   BLACK ELDERBERRY PO Take 10 mLs by mouth daily.   cetirizine (ZYRTEC) 10 MG tablet Take 10 mg by mouth daily.   Continuous Blood Gluc Receiver (DEXCOM G6 RECEIVER) DEVI 1 Device by Does not apply route every morning. Device and supplies   dapagliflozin propanediol (FARXIGA) 10 MG TABS tablet Take 1 tablet (10 mg total) by mouth daily.   Dulaglutide 1.5 MG/0.5ML SOPN Inject 1.5 mg into the skin once a week.   DULoxetine (CYMBALTA) 60 MG capsule TAKE 2 CAPSULES BY MOUTH DAILY   ezetimibe (ZETIA) 10 MG tablet TAKE 1 TABLET BY MOUTH EVERY DAY   famotidine (PEPCID) 20 MG tablet Take 20 mg by mouth daily as needed for heartburn or indigestion.   insulin glargine (LANTUS SOLOSTAR) 100 UNIT/ML Solostar Pen Inject 52 Units into the skin daily.   Insulin Pen Needle 31G X 5 MM MISC Inject 1 each into the skin daily.   levothyroxine (SYNTHROID) 88 MCG tablet TAKE 1 TABLET BY MOUTH DAILY BEFORE BREAKFAST.   magnesium oxide (MAG-OX) 400 (240 Mg) MG tablet TAKE 0.5 TABLETS (200 MG TOTAL) BY MOUTH 2 TIMES DAILY.   metoprolol succinate (TOPROL-XL) 100 MG 24 hr tablet Take  1 tablet (100 mg total) by mouth daily. Take with or immediately following a meal.   nitroGLYCERIN (NITROSTAT) 0.4 MG SL tablet PLACE 1 TABLET UNDER THE TONGUE EVERY 5 MINUTES AS NEEDED FOR CHEST PAIN. IF YOU REQUIRE MORE THAN TWO TABLETS FIVE MINUTES APART GO TO THE NEAREST ER VIA EMS. (Patient taking differently: Place 0.4 mg under the tongue every 5 (five) minutes as needed for chest pain.)   ondansetron (ZOFRAN ODT) 4 MG disintegrating tablet  Take 1 tablet (4 mg total) by mouth every 8 (eight) hours as needed for nausea or vomiting.   pravastatin (PRAVACHOL) 80 MG tablet TAKE 1 TABLET BY MOUTH EVERY EVENING   sacubitril-valsartan (ENTRESTO) 97-103 MG Take 1 tablet by mouth 2 (two) times daily.   spironolactone (ALDACTONE) 25 MG tablet TAKE 1 TABLET BY MOUTH EVERY DAY   Vitamin D, Ergocalciferol, (DRISDOL) 1.25 MG (50000 UNIT) CAPS capsule TAKE 1 CAPSULE (50,000 UNITS TOTAL) BY MOUTH EVERY 7 (SEVEN) DAYS     PAST MEDICAL HISTORY: Past Medical History:  Diagnosis Date   Anxiety    Coronary artery calcification    Depression    Diabetes mellitus without complication ([place])    Hyperlipidemia    Hypertension    Thyroid disease     PAST SURGICAL HISTORY: Past Surgical History:  Procedure Laterality Date   BREAST EXCISIONAL BIOPSY Left    CHOLECYSTECTOMY     COLON SURGERY     diverticulitis with perforation; s/p colon resection.   Colonoscopy     DENTAL SURGERY     RIGHT/LEFT HEART CATH AND CORONARY ANGIOGRAPHY N/A 01/22/2021   Procedure: RIGHT/LEFT HEART CATH AND CORONARY ANGIOGRAPHY;  Surgeon: Adrian Prows, MD;  Location: [facility];  Service: Cardiovascular;  Laterality: N/A;   TUBAL LIGATION      FAMILY HISTORY: The patient family history includes Breast cancer in her paternal aunt; Cancer in her mother; Hyperlipidemia in her mother; Other in her father.  SOCIAL HISTORY:  The patient  reports that she has been smoking cigarettes. She has never used smokeless tobacco. She reports that she does not drink alcohol and does not use drugs.  REVIEW OF SYSTEMS: Review of Systems  Constitutional: Negative for chills and fever.  HENT:  Negative for hoarse voice and nosebleeds.   Eyes:  Negative for discharge, double vision and pain.  Cardiovascular:  Negative for chest pain, claudication, dyspnea on exertion, leg swelling, near-syncope, orthopnea, palpitations, paroxysmal nocturnal dyspnea and syncope.  Respiratory:   Negative for hemoptysis and shortness of breath.   Musculoskeletal:  Negative for muscle cramps and myalgias.  Gastrointestinal:  Negative for abdominal pain, constipation, diarrhea, hematemesis, hematochezia, melena, nausea and vomiting.  Neurological:  Negative for dizziness and light-headedness.   PHYSICAL EXAM: Vitals with BMI 12/09/2021 12/03/2021 09/11/2021  Height 5\' 0"  5\' 0"  5\' 0"   Weight 165 lbs 6 oz 160 lbs 13 oz 167 lbs 3 oz  BMI 32.3 [id number]  Systolic [id number]  Diastolic 79 60 71  Pulse 83 87 68    CONSTITUTIONAL: Well-developed and well-nourished. No acute distress.  SKIN: Skin is warm and dry. No rash noted. No cyanosis. No pallor. No jaundice HEAD: Normocephalic and atraumatic.  EYES: No scleral icterus MOUTH/THROAT: Moist oral membranes.  NECK: No JVD present. No thyromegaly noted. No carotid bruits  LYMPHATIC: No visible cervical adenopathy.  CHEST Normal respiratory effort. No intercostal retractions  LUNGS: Clear to auscultation bilaterally.  No stridor. No wheezes. No rales.  CARDIOVASCULAR: Regular rate  and rhythm, positive S1-S2, no murmurs rubs or gallops appreciated. ABDOMINAL: Obese, soft, nontender, nondistended, positive bowel sounds in all 4 quadrants.  No apparent ascites.  EXTREMITIES: No peripheral edema, warm to touch.  HEMATOLOGIC: No significant bruising NEUROLOGIC: Oriented to person, place, and time. Nonfocal. Normal muscle tone.  PSYCHIATRIC: Normal mood and affect. Normal behavior. Cooperative  CARDIAC DATABASE:  EKG:  08/07/2021: Normal sinus rhythm, 77 bpm, left bundle branch block, left anterior fascicular block, left axis, without underlying injury pattern.   Coronary calcium scoring 05/13/2015: LAD: 8, Cx: 2. Total calcium score is 10.   Echocardiogram: 01/20/2021: LVEF 35-40%  05/15/2021: Technically difficult study. Left ventricle cavity is normal in size. Moderate concentric hypertrophy of the left ventricle. Mild global  hypokinesis. LVEF 40-45%. Indeterminate diastolic filling pattern.  No significant valvular abnormality. Normal right atrial pressure.  Compared to previous study on 04/23/2020, LVEF is marginally improved form 35-40%.  Stress Testing: Lexiscan Tetrofosmin Stress Test  05/13/2020:  There is a fixed mild defect in the inferior region due to diaphragmatic attenuation. No ischemia or scar.  The LV is moderately dilated with LV end diastolic volume of [id number] mL.  Overall LV systolic function is abnormal without regional wall motion abnormalities. Stress LV EF: 21%.   High risk study due to low LVEF. Findings suggest non ischemic dilated cardiomyopathy.  Heart Catheterization: Right + left heart catheterization 01/22/2021: RA 16/12, mean 11 mmHg, RA saturation 80%. RV 47/8, EDP 15 mmHg. PA 41/26, mean 33 mmHg.  PA saturation 77%. PW 32/26, mean 29 mmHg.  Aortic saturation 98%. Cardiac output 6.58, cardiac index 3.80 by Fick.  Stroke-volume 70 mL.  SVR 16.87, PVR 1.05.  QP/QS 0.86. LV: Global hypokinesis, EF 35%.  EDP markedly elevated at 32 mmHg.  There was no pressure gradient across the aortic valve. Left main: Mild disease. LAD: Mild diffuse disease. CX: Mild diffuse disease.  I was told he was 73 something right RI: Mild diffuse disease. RCA: Dominant, mild diffuse disease.   Impression: Nonischemic cardiomyopathy with severe LV systolic dysfunction.  Mild to moderate pulmonary hypertension secondary to diastolic dysfunction with LV EDP elevation.  LABORATORY DATA: CBC Latest Ref Rng & Units 02/21/2021 01/24/2021 01/23/2021  WBC 3.4 - 10.8 x10E3/uL 11.3(H) 15.3(H) 13.9(H)  Hemoglobin 11.1 - 15.9 g/dL 15.7 15.7(H) 15.2(H)  Hematocrit 34.0 - 46.6 % 47.5(H) 44.9 44.3  Platelets 150 - 450 x10E3/uL 194 221 201    CMP Latest Ref Rng & Units 12/03/2021 05/05/2021 02/21/2021  Glucose 70 - 99 mg/dL 115(H) 167(H) 498(H)  BUN 6 - 23 mg/dL 13 11 15   Creatinine 0.40 - 1.20 mg/dL 1.25(H) 0.97 1.54(H)   Sodium 135 - 145 mEq/L 141 136 137  Potassium 3.5 - 5.1 mEq/L 4.5 3.9 5.9(H)  Chloride 96 - 112 mEq/L 101 99 91(L)  CO2 19 - 32 mEq/L 29 26 21   Calcium 8.4 - 10.5 mg/dL 9.8 9.4 9.9  Total Protein 6.0 - 8.3 g/dL 7.2 7.2 -  Total Bilirubin 0.2 - 1.2 mg/dL 0.5 0.5 -  Alkaline Phos 39 - 117 U/L 75 82 -  AST 0 - 37 U/L 16 16 -  ALT 0 - 35 U/L 9 9 -    Lipid Panel  Lab Results  Component Value Date   CHOL 122 12/03/2021   HDL 39.30 12/03/2021   LDLCALC 54 12/03/2021   TRIG 143.0 12/03/2021   CHOLHDL 3 12/03/2021    Lipid Panel Recent Labs    05/05/21 1558 12/03/21 1237  CHOL  130 122  TRIG 118.0 143.0  LDLCALC 62 54  VLDL 23.6 28.6  HDL 44.50 39.30  CHOLHDL 3 3    Lab Results  Component Value Date   HGBA1C 6.7 (H) 12/03/2021   HGBA1C 13.1 (A) 09/11/2021   HGBA1C 11.9 (H) 05/05/2021   No components found for: NTPROBNP Lab Results  Component Value Date   TSH 0.22 (L) 12/03/2021   TSH 0.294 (L) 01/20/2021   TSH 1.160 09/06/2020    IMPRESSION:    ICD-10-CM   1. Chronic combined systolic and diastolic heart failure (HCC)  I50.42     2. Nonischemic cardiomyopathy (HCC)  I42.8     3. Coronary artery calcification seen on computed tomography  I25.10     4. LBBB (left bundle branch block)  I44.7     5. Essential hypertension, benign  I10     6. Type 2 diabetes mellitus with hyperglycemia, with long-term current use of insulin (HCC)  E11.65    Z79.4     7. Long-term insulin use (HCC)  Z79.4     8. Hypercholesterolemia  E78.00     9. Cigarette smoker  F17.210        RECOMMENDATIONS: MILIRA HELMAN is a 66 y.o. female whose past medical history and cardiac risk factors include:  insulin-dependent type 2 diabetes with hyperglycemia, pure hypercholesterolemia, hypertriglyceridemia, coronary artery calcification, nonischemic cardiomyopathy, left bundle branch block, essential hypertension, hypothyroidism, tobacco use disorder, vitamin D deficiency, generalized  anxiety disorder.  Chronic combined systolic and diastolic heart failure, stage C, NYHA class II: Last hospitalization for congestive heart failure February 2022. Medications reconciled. Most recent echocardiogram results reviewed, LVEF mildly improved as noted above. Glycemic control has improved since last visit. Strict I's and O's, daily weights. Patient has enrolled into principal care management for congestive heart failure and her weight remains relatively stable.  She is stopped transmitting her daily weights since November 2022.  She will bring in the weight scale in the next few days for troubleshooting and will continue with principal care management. No changes in medical therapy but we will help assist with applying for patient assistance with regards to [place].  Nonischemic cardiomyopathy: See plan above  Calcified atherosclerotic coronary artery disease:  Continue aspirin and statin therapy.   Educated on importance of secondary prevention.   Educated on the importance of complete smoking cessation.   We will continue to follow.  Left bundle branch block: Monitor for now.   Non-insulin-dependent diabetes mellitus type 2:  Most recent hemoglobin A1c reviewed.  Her a1c has improved from 13.1 to 6.7. Currently managed per primary team.  Benign essential hypertension:  Currently managed by primary provider.  Educated on importance of low-salt diet.  Active tobacco use:  Improving. Tobacco cessation counseling: Currently smoking 2-4 packs/day   Patient was informed of the dangers of tobacco abuse including stroke, cancer, and MI, as well as benefits of tobacco cessation. Patient is willing to quit at this time. 5 mins were spent counseling patient cessation techniques. We discussed various methods to help quit smoking, including deciding on a date to quit, joining a support group, pharmacological agents- nicotine gum/patch/lozenges.  I will reassess her  progress at the next follow-up visit  Recommend follow-up visit in 3 months given combined systolic and diastolic heart failure with the hopes of/intentions of reducing hospitalization.  Patient is agreeable with the plan of care and is thankful.  FINAL MEDICATION LIST END OF ENCOUNTER: No orders of the defined  types were placed in this encounter.    Medications Discontinued During This Encounter  Medication Reason   naproxen sodium (ALEVE) 220 MG tablet      Current Outpatient Medications:    ACCU-CHEK AVIVA PLUS test strip, daily. for testing, Disp: , Rfl: 2   albuterol (VENTOLIN HFA) 108 (90 Base) MCG/ACT inhaler, Inhale 1 puff into the lungs every 6 (six) hours as needed for wheezing or shortness of breath., Disp: , Rfl:    ALPRAZolam (XANAX) 1 MG tablet, Take 0.5 mg by mouth 3 (three) times daily as needed for anxiety or sleep. , Disp: , Rfl:    aspirin 81 MG tablet, Take 81 mg by mouth daily., Disp: , Rfl:    BLACK ELDERBERRY PO, Take 10 mLs by mouth daily., Disp: , Rfl:    cetirizine (ZYRTEC) 10 MG tablet, Take 10 mg by mouth daily., Disp: , Rfl:    Continuous Blood Gluc Receiver ([place]) DEVI, 1 Device by Does not apply route every morning. Device and supplies, Disp: 1 each, Rfl: 0   dapagliflozin propanediol (FARXIGA) 10 MG TABS tablet, Take 1 tablet (10 mg total) by mouth daily., Disp: 30 tablet, Rfl: 3   Dulaglutide 1.5 MG/0.5ML SOPN, Inject 1.5 mg into the skin once a week., Disp: 6 mL, Rfl: 0   DULoxetine (CYMBALTA) 60 MG capsule, TAKE 2 CAPSULES BY MOUTH DAILY, Disp: 180 capsule, Rfl: 1   ezetimibe (ZETIA) 10 MG tablet, TAKE 1 TABLET BY MOUTH EVERY DAY, Disp: 90 tablet, Rfl: 0   famotidine (PEPCID) 20 MG tablet, Take 20 mg by mouth daily as needed for heartburn or indigestion., Disp: , Rfl:    insulin glargine (LANTUS SOLOSTAR) 100 UNIT/ML Solostar Pen, Inject 52 Units into the skin daily., Disp: 15 mL, Rfl: 5   Insulin Pen Needle 31G X 5 MM MISC, Inject 1 each  into the skin daily., Disp: 100 each, Rfl: 2   levothyroxine (SYNTHROID) 88 MCG tablet, TAKE 1 TABLET BY MOUTH DAILY BEFORE BREAKFAST., Disp: 90 tablet, Rfl: 1   magnesium oxide (MAG-OX) 400 (240 Mg) MG tablet, TAKE 0.5 TABLETS (200 MG TOTAL) BY MOUTH 2 TIMES DAILY., Disp: 90 tablet, Rfl: 1   metoprolol succinate (TOPROL-XL) 100 MG 24 hr tablet, Take 1 tablet (100 mg total) by mouth daily. Take with or immediately following a meal., Disp: 90 tablet, Rfl: 2   nitroGLYCERIN (NITROSTAT) 0.4 MG SL tablet, PLACE 1 TABLET UNDER THE TONGUE EVERY 5 MINUTES AS NEEDED FOR CHEST PAIN. IF YOU REQUIRE MORE THAN TWO TABLETS FIVE MINUTES APART GO TO THE NEAREST ER VIA EMS. (Patient taking differently: Place 0.4 mg under the tongue every 5 (five) minutes as needed for chest pain.), Disp: 25 tablet, Rfl: 1   ondansetron (ZOFRAN ODT) 4 MG disintegrating tablet, Take 1 tablet (4 mg total) by mouth every 8 (eight) hours as needed for nausea or vomiting., Disp: 20 tablet, Rfl: 1   pravastatin (PRAVACHOL) 80 MG tablet, TAKE 1 TABLET BY MOUTH EVERY EVENING, Disp: 90 tablet, Rfl: 1   sacubitril-valsartan (ENTRESTO) 97-103 MG, Take 1 tablet by mouth 2 (two) times daily., Disp: 14 tablet, Rfl: 0   spironolactone (ALDACTONE) 25 MG tablet, TAKE 1 TABLET BY MOUTH EVERY DAY, Disp: 90 tablet, Rfl: 1   Vitamin D, Ergocalciferol, (DRISDOL) 1.25 MG (50000 UNIT) CAPS capsule, TAKE 1 CAPSULE (50,000 UNITS TOTAL) BY MOUTH EVERY 7 (SEVEN) DAYS, Disp: 13 capsule, Rfl: 1  No orders of the defined types were placed in this encounter.  --  Continue cardiac medications as reconciled in final medication list. --Return in about 3 months (around 03/09/2022) for Follow up, heart failure management.. Or sooner if needed. --Continue follow-up with your primary care physician regarding the management of your other chronic comorbid conditions.  Patient's questions and concerns were addressed to her satisfaction. She voices understanding of the  instructions provided during this encounter.   This note was created using a voice recognition software as a result there may be grammatical errors inadvertently enclosed that do not reflect the nature of this encounter. Every attempt is made to correct such errors.  Total time spent: 35 minutes.  Rex Kras, [place], [facility]  Pager: [phone] Office: [phone]

## 2021-12-09 NOTE — Telephone Encounter (Signed)
Called pt back, voiced understnading

## 2021-12-09 NOTE — Telephone Encounter (Signed)
-----   Message from Shade Flood, MD sent at 12/07/2021  6:07 PM EST ----- Call patient  Cholesterol levels were normal.  Blood sugar 115, with mildly elevated kidney function test but stable from some previous readings.  Diabetes test actually is well controlled at 6.7.  This is much better than the reading in October.  Great work.  No change in diabetes medications for now.  With that great control, watch for low blood sugar readings and if lows occur let me know right away.  Thyroid test was borderline low.  We can discuss this at her follow-up visit next week and possible change in meds.  Let me know if there are questions in the interim.

## 2021-12-12 ENCOUNTER — Other Ambulatory Visit: Payer: Self-pay | Admitting: Family Medicine

## 2021-12-15 ENCOUNTER — Ambulatory Visit (INDEPENDENT_AMBULATORY_CARE_PROVIDER_SITE_OTHER): Payer: Medicare Other | Admitting: Family Medicine

## 2021-12-15 ENCOUNTER — Encounter: Payer: Self-pay | Admitting: Family Medicine

## 2021-12-15 VITALS — BP 132/78 | HR 84 | Temp 98.0°F | Resp 16 | Ht 60.0 in | Wt 165.2 lb

## 2021-12-15 DIAGNOSIS — R251 Tremor, unspecified: Secondary | ICD-10-CM

## 2021-12-15 DIAGNOSIS — E1165 Type 2 diabetes mellitus with hyperglycemia: Secondary | ICD-10-CM

## 2021-12-15 DIAGNOSIS — E039 Hypothyroidism, unspecified: Secondary | ICD-10-CM | POA: Diagnosis not present

## 2021-12-15 DIAGNOSIS — Z794 Long term (current) use of insulin: Secondary | ICD-10-CM

## 2021-12-15 DIAGNOSIS — G2581 Restless legs syndrome: Secondary | ICD-10-CM | POA: Diagnosis not present

## 2021-12-15 NOTE — Progress Notes (Signed)
Subjective:  Patient ID: Felicia Hanson, female    DOB: 1956-01-24  Age: 66 y.o. MRN: [id number]  CC:  Chief Complaint  Patient presents with   Hyperthyroidism    Pt here for discussion about possible changing meds due to recent labs    Diabetes    Pt labs looked improved, pt has not seen result note    HPI Felicia Hanson presents for   Diabetes: Complicated by previous uncontrolled -hyperglycemia, microalbuminuria.  Significant improvement in readings after visit with my colleague in October.  Had been on Lantus 54 units nightly, and Trulicity 1.5 mg weekly at her visit with me January 4.  Past exam postprandials were in the low 100s with occasional 200s.  Weight had improved.  She was additionally referred to endocrinology for ongoing management. Denies symptomatic lows Home readings lowest 106, usually under 130's. No 200's.  Lab Results  Component Value Date   HGBA1C 6.7 (H) 12/03/2021   HGBA1C 13.1 (A) 09/11/2021   HGBA1C 11.9 (H) 05/05/2021   Lab Results  Component Value Date   MICROALBUR 19.2 (H) 05/21/2021   LDLCALC 54 12/03/2021   CREATININE 1.25 (H) 12/03/2021   Hypothyroidism: Lab Results  Component Value Date   TSH 0.22 (L) 12/03/2021  Taking medication daily.  Synthroid 88 mcg. No new hot or cold intolerance. No new hair or skin changes, heart palpitations or new fatigue. No new weight changes. Had lost some weight. Feeling less fatigued.   Intermittent tremor still present - referral pending with neuro. More with stress along with RLS. Has not d/w Dr. Evelene Croon (psychiatry) yet.    Hypertension, CHF, cardiac: History of CHF, chronic combined systolic and diastolic, coronary artery calcification, nonischemic cardiomyopathy, LBBB, hypertension hyperlipidemia.  Visit with her cardiologist January 10.  Continued on statin, aspirin therapy for the carotid artery disease, low-salt diet recommended.  Tobacco cessation counseling and was improving 3-4 cigs per day - 1-2  puffs sometimes.  Home readings: BP Readings from Last 3 Encounters:  12/15/21 132/78  12/09/21 136/79  12/03/21 128/60   Lab Results  Component Value Date   CREATININE 1.25 (H) 12/03/2021       History Patient Active Problem List   Diagnosis Date Noted   Shortness of breath at rest 05/09/2021   Loud snoring 03/24/2021   Excessive daytime sleepiness 03/24/2021   Acute on chronic combined systolic and diastolic HF (heart failure) (HCC) 03/24/2021   Panlobular emphysema (HCC) 03/24/2021   GAD (generalized anxiety disorder) 03/24/2021   Atypical chest pain 01/22/2021   Respiratory distress 01/20/2021   Acute on chronic combined systolic and diastolic CHF (congestive heart failure) (HCC) 01/20/2021   Type 2 diabetes mellitus without complication, without long-term current use of insulin (HCC) 10/26/2017   Generalized anxiety disorder 01/10/2014   Vitamin D deficiency 01/10/2014   Essential hypertension, benign 01/10/2014   Pure hypercholesterolemia 01/10/2014   Hypothyroidism 01/10/2014   Past Medical History:  Diagnosis Date   Anxiety    Coronary artery calcification    Depression    Diabetes mellitus without complication (HCC)    Hyperlipidemia    Hypertension    Thyroid disease    Past Surgical History:  Procedure Laterality Date   BREAST EXCISIONAL BIOPSY Left    CHOLECYSTECTOMY     COLON SURGERY     diverticulitis with perforation; s/p colon resection.   Colonoscopy     DENTAL SURGERY     RIGHT/LEFT HEART CATH AND CORONARY ANGIOGRAPHY N/A 01/22/2021  Procedure: RIGHT/LEFT HEART CATH AND CORONARY ANGIOGRAPHY;  Surgeon: Yates Decamp, MD;  Location: [facility];  Service: Cardiovascular;  Laterality: N/A;   TUBAL LIGATION     Allergies  Allergen Reactions   Metformin And Related Anaphylaxis    Could not swallow   Nicotine Rash    Only allergic to nicotine patch   Ace Inhibitors Cough    Cough with lisinopril.    Prior to Admission medications    Medication Sig Start Date End Date Taking? Authorizing Provider  ACCU-CHEK AVIVA PLUS test strip daily. for testing 03/11/18  Yes [provider]  albuterol (VENTOLIN HFA) 108 (90 Base) MCG/ACT inhaler Inhale 1 puff into the lungs every 6 (six) hours as needed for wheezing or shortness of breath.   Yes [provider]  ALPRAZolam Prudy Feeler) 1 MG tablet Take 0.5 mg by mouth 3 (three) times daily as needed for anxiety or sleep.  08/21/13  Yes [provider]  aspirin 81 MG tablet Take 81 mg by mouth daily.   Yes [provider]  BLACK ELDERBERRY PO Take 10 mLs by mouth daily.   Yes [provider]  cetirizine (ZYRTEC) 10 MG tablet Take 10 mg by mouth daily.   Yes [provider]  Continuous Blood Gluc Receiver (DEXCOM G6 RECEIVER) DEVI 1 Device by Does not apply route every morning. Device and supplies 04/18/20  Yes Shade Flood, MD  dapagliflozin propanediol (FARXIGA) 10 MG TABS tablet Take 1 tablet (10 mg total) by mouth daily. 01/25/21  Yes Regalado, Belkys A, MD  Dulaglutide 1.5 MG/0.5ML SOPN Inject 1.5 mg into the skin once a week. 09/11/21  Yes Janeece Agee, NP  DULoxetine (CYMBALTA) 60 MG capsule TAKE 2 CAPSULES BY MOUTH DAILY 06/06/21  Yes Shade Flood, MD  ezetimibe (ZETIA) 10 MG tablet TAKE 1 TABLET BY MOUTH EVERY DAY 12/12/21  Yes Shade Flood, MD  famotidine (PEPCID) 20 MG tablet Take 20 mg by mouth daily as needed for heartburn or indigestion.   Yes [provider]  insulin glargine (LANTUS SOLOSTAR) 100 UNIT/ML Solostar Pen Inject 52 Units into the skin daily. 09/11/21  Yes Janeece Agee, NP  Insulin Pen Needle 31G X 5 MM MISC Inject 1 each into the skin daily. 02/10/21  Yes Shade Flood, MD  levothyroxine (SYNTHROID) 88 MCG tablet TAKE 1 TABLET BY MOUTH DAILY BEFORE BREAKFAST. 06/16/21  Yes Shade Flood, MD  magnesium oxide (MAG-OX) 400 (240 Mg) MG tablet TAKE 0.5 TABLETS (200 MG TOTAL) BY MOUTH 2 TIMES  DAILY. 08/11/21  Yes Tolia, Sunit, DO  metoprolol succinate (TOPROL-XL) 100 MG 24 hr tablet Take 1 tablet (100 mg total) by mouth daily. Take with or immediately following a meal. 02/25/21  Yes Tolia, Sunit, DO  nitroGLYCERIN (NITROSTAT) 0.4 MG SL tablet PLACE 1 TABLET UNDER THE TONGUE EVERY 5 MINUTES AS NEEDED FOR CHEST PAIN. IF YOU REQUIRE MORE THAN TWO TABLETS FIVE MINUTES APART GO TO THE NEAREST ER VIA EMS. Patient taking differently: Place 0.4 mg under the tongue every 5 (five) minutes as needed for chest pain. 07/29/20  Yes Tolia, Sunit, DO  ondansetron (ZOFRAN ODT) 4 MG disintegrating tablet Take 1 tablet (4 mg total) by mouth every 8 (eight) hours as needed for nausea or vomiting. 07/14/19  Yes Stallings, Zoe A, MD  pravastatin (PRAVACHOL) 80 MG tablet TAKE 1 TABLET BY MOUTH EVERY EVENING 11/29/21  Yes Shade Flood, MD  sacubitril-valsartan (ENTRESTO) 97-103 MG Take 1 tablet by  mouth 2 (two) times daily. 09/26/21  Yes Cantwell, Celeste C, PA-C  spironolactone (ALDACTONE) 25 MG tablet TAKE 1 TABLET BY MOUTH EVERY DAY 12/12/21  Yes Shade Flood, MD  Vitamin D, Ergocalciferol, (DRISDOL) 1.25 MG (50000 UNIT) CAPS capsule TAKE 1 CAPSULE (50,000 UNITS TOTAL) BY MOUTH EVERY 7 (SEVEN) DAYS 08/13/21  Yes Shade Flood, MD   Social History   Socioeconomic History   Marital status: Married    Spouse name: Not on file   Number of children: 2   Years of education: some college   Highest education level: Not on file  Occupational History   Occupation: Retired  Tobacco Use   Smoking status: Every Day    Years: 38.00    Types: Cigarettes   Smokeless tobacco: Never   Tobacco comments:    2-5 A DAY  Vaping Use   Vaping Use: Never used  Substance and Sexual Activity   Alcohol use: No    Alcohol/week: 0.0 standard drinks   Drug use: No   Sexual activity: Not Currently  Other Topics Concern   Not on file  Social History Narrative   Marital status: married      Children:  2 children;  4 grandchildren      Lives: with husband, 2 granddaughters      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   Left-handed.   Caffeine use: 2.5 cups caffeine per day.   Social Determinants of Health   Financial Resource Strain: Low Risk    Difficulty of Paying Living Expenses: Not very hard  Food Insecurity: No Food Insecurity   Worried About Programme researcher, broadcasting/film/video in the Last Year: Never true   Ran Out of Food in the Last Year: Never true  Transportation Needs: No Transportation Needs   Lack of Transportation (Medical): No   Lack of Transportation (Non-Medical): No  Physical Activity: Not on file  Stress: Not on file  Social Connections: Not on file  Intimate Partner Violence: Not on file    Review of Systems  Constitutional:  Negative for fatigue and unexpected weight change.  Respiratory:  Negative for chest tightness and shortness of breath.   Cardiovascular:  Negative for chest pain, palpitations and leg swelling.  Gastrointestinal:  Negative for abdominal pain and blood in stool.  Neurological:  Negative for dizziness, syncope, light-headedness and headaches.    Objective:   Vitals:   12/15/21 1119  BP: 132/78  Pulse: 84  Resp: 16  Temp: 98 F (36.7 C)  TempSrc: Temporal  SpO2: 97%  Weight: 165 lb 3.2 oz (74.9 kg)  Height: 5' (1.524 m)     Physical Exam Vitals reviewed.  Constitutional:      Appearance: Normal appearance. She is well-developed.  HENT:     Head: Normocephalic and atraumatic.  Eyes:     Conjunctiva/sclera: Conjunctivae normal.     Pupils: Pupils are equal, round, and reactive to light.  Neck:     Vascular: No carotid bruit.  Cardiovascular:     Rate and Rhythm: Normal rate and regular rhythm.     Heart sounds: Normal heart sounds.  Pulmonary:     Effort: Pulmonary effort is normal.     Breath sounds: Normal breath sounds.  Abdominal:     Palpations: Abdomen is soft. There is no  pulsatile mass.     Tenderness: There is no abdominal  tenderness.  Musculoskeletal:     Right lower leg: No edema.     Left lower leg: No edema.  Skin:    General: Skin is warm and dry.  Neurological:     Mental Status: She is alert and oriented to person, place, and time.  Psychiatric:        Mood and Affect: Mood normal.        Behavior: Behavior normal.        Thought Content: Thought content normal.       Assessment & Plan:  Felicia Hanson is a 66 y.o. female . Type 2 diabetes mellitus with hyperglycemia, with long-term current use of insulin (HCC)  - much better control on last testing. No symptomatic lows. Will continue same regimen, follow up with endocrinology to eval regimen and follow up given variable control prior.   Hypothyroidism, unspecified type - Plan: TSH + free T4  - repeat labs with free t4 to decide on changes.   Tremor of both hands RLS (restless legs syndrome)  - noted more with anxiety - recommend discussing with psychiatrist but also follow up with neuro as planned.   No orders of the defined types were placed in this encounter.  Patient Instructions  It appears [facility] has been trying to reach you for appointment - call them to schedule appointment for the tremor. It may be helpful to discuss with your psychiatrist as well if stress/anxiety tends to trigger symptoms. She can also manage Cymbalta refills, but let me know if you do need a refill.   If thyroid test is still low, we may change your synthroid dose - I will let you know.   No other med changes at this time.   Thanks for coming in today.     Signed,   Meredith Staggers, MD [facility], [facility] 12/15/21 9:51 PM

## 2021-12-15 NOTE — Patient Instructions (Addendum)
It appears [facility] has been trying to reach you for appointment - call them to schedule appointment for the tremor. It may be helpful to discuss with your psychiatrist as well if stress/anxiety tends to trigger symptoms. She can also manage Cymbalta refills, but let me know if you do need a refill.   If thyroid test is still low, we may change your synthroid dose - I will let you know.   No other med changes at this time.   Thanks for coming in today.

## 2021-12-16 LAB — TSH+FREE T4: TSH W/REFLEX TO FT4: 6.39 mIU/L — ABNORMAL HIGH (ref 0.40–4.50)

## 2021-12-16 LAB — T4, FREE: Free T4: 0.7 ng/dL — ABNORMAL LOW (ref 0.8–1.8)

## 2021-12-27 ENCOUNTER — Other Ambulatory Visit: Payer: Self-pay | Admitting: Cardiology

## 2021-12-27 DIAGNOSIS — I5022 Chronic systolic (congestive) heart failure: Secondary | ICD-10-CM

## 2021-12-29 ENCOUNTER — Other Ambulatory Visit: Payer: Self-pay

## 2022-01-01 ENCOUNTER — Ambulatory Visit (AMBULATORY_SURGERY_CENTER): Payer: Medicare Other | Admitting: *Deleted

## 2022-01-01 ENCOUNTER — Other Ambulatory Visit: Payer: Self-pay

## 2022-01-01 ENCOUNTER — Encounter: Payer: Self-pay | Admitting: Internal Medicine

## 2022-01-01 VITALS — Ht 60.0 in | Wt 158.0 lb

## 2022-01-01 DIAGNOSIS — Z1211 Encounter for screening for malignant neoplasm of colon: Secondary | ICD-10-CM

## 2022-01-01 NOTE — Progress Notes (Signed)
No egg or soy allergy known to patient  No issues known to pt with past sedation with any surgeries or procedures Patient denies ever being told they had issues or difficulty with intubation  No FH of Malignant Hyperthermia Pt is not on diet pills Pt is not on  home 02  Pt is not on blood thinners  Pt states issues with constipation - she takes probiotic and it helps- also uses Miralax PRN which causes diarrhea - has 1 BM daily  No A fib or A flutter  Pt is fully vaccinated  for Covid   Due to the COVID-19 pandemic we are asking patients to follow certain guidelines in PV and the [facility]   Pt aware of COVID protocols and [facility] guidelines   PV completed over the phone. Pt verified name, DOB, address and insurance during PV today.  Pt mailed instruction packet with copy of consent form to read and not return, and instructions.  Pt encouraged to call with questions or issues.  If pt has My chart, procedure instructions sent via My Chart

## 2022-01-06 ENCOUNTER — Other Ambulatory Visit: Payer: Self-pay

## 2022-01-06 ENCOUNTER — Telehealth (INDEPENDENT_AMBULATORY_CARE_PROVIDER_SITE_OTHER): Payer: Medicare Other | Admitting: Registered Nurse

## 2022-01-06 ENCOUNTER — Encounter: Payer: Self-pay | Admitting: Registered Nurse

## 2022-01-06 ENCOUNTER — Other Ambulatory Visit: Payer: Self-pay | Admitting: Cardiology

## 2022-01-06 ENCOUNTER — Other Ambulatory Visit: Payer: Self-pay | Admitting: Family Medicine

## 2022-01-06 DIAGNOSIS — R1114 Bilious vomiting: Secondary | ICD-10-CM

## 2022-01-06 DIAGNOSIS — I5042 Chronic combined systolic (congestive) and diastolic (congestive) heart failure: Secondary | ICD-10-CM

## 2022-01-06 DIAGNOSIS — E034 Atrophy of thyroid (acquired): Secondary | ICD-10-CM

## 2022-01-06 DIAGNOSIS — R7989 Other specified abnormal findings of blood chemistry: Secondary | ICD-10-CM

## 2022-01-06 MED ORDER — ONDANSETRON 4 MG PO TBDP: 4.0000 mg | ORAL_TABLET | Freq: Three times a day (TID) | ORAL | 1 refills | Status: DC | PRN

## 2022-01-06 MED ORDER — SACUBITRIL-VALSARTAN 97-103 MG PO TABS: 1.0000 | ORAL_TABLET | Freq: Two times a day (BID) | ORAL | 0 refills | Status: DC

## 2022-01-06 NOTE — Patient Instructions (Signed)
° ° ° °  If you have lab work done today you will be contacted with your lab results within the next 2 weeks.  If you have not heard from us then please contact us. The fastest way to get your results is to register for My Chart. ° ° °IF you received an x-ray today, you will receive an invoice from [facility]. Please contact [facility] at [phone] with questions or concerns regarding your invoice.  ° °IF you received labwork today, you will receive an invoice from [facility]. Please contact [facility] at [phone] with questions or concerns regarding your invoice.  ° °Our billing staff will not be able to assist you with questions regarding bills from these companies. ° °You will be contacted with the lab results as soon as they are available. The fastest way to get your results is to activate your My Chart account. Instructions are located on the last page of this paperwork. If you have not heard from us regarding the results in 2 weeks, please contact this office. °  ° ° ° °

## 2022-01-06 NOTE — Progress Notes (Signed)
Telemedicine Encounter- SOAP NOTE Established Patient  This telephone encounter was conducted with the patient's (or proxy's) verbal consent via audio telecommunications: yes/no: Yes Patient was instructed to have this encounter in a suitably private space; and to only have persons present to whom they give permission to participate. In addition, patient identity was confirmed by use of name plus two identifiers (DOB and address).  I discussed the limitations, risks, security and privacy concerns of performing an evaluation and management service by telephone and the availability of in person appointments. I also discussed with the patient that there may be a patient responsible charge related to this service. The patient expressed understanding and agreed to proceed.  I spent a total of 15 minutes talking with the patient or their proxy.  Patient at home Provider in office  Participants: Jari Sportsman, NP and [name]  Chief Complaint  Patient presents with   Diarrhea    Patient states she has been having diarrhea and vomiting since about 7:00 pm last night. Patient states she has tested negative for covid.    Subjective   Felicia Hanson is a 66 y.o. established patient. Telephone visit today for nvd  HPI Onset last night 7:00pm  Nausea, vomiting, diarrhea.  Multiple sick contacts with similar symptoms - her husband has similar symptoms. Had a grandchild over last night who was not sick. Sister did have COVID a few weeks ago, then had similar GI symptoms after that.  Fever to 101F.  Has not checked sugars today No PO intake, feels dehydrated and weak Denies CV symptoms.   Patient Active Problem List   Diagnosis Date Noted   Shortness of breath at rest 05/09/2021   Loud snoring 03/24/2021   Excessive daytime sleepiness 03/24/2021   Acute on chronic combined systolic and diastolic HF (heart failure) (HCC) 03/24/2021   Panlobular emphysema (HCC) 03/24/2021   GAD  (generalized anxiety disorder) 03/24/2021   Atypical chest pain 01/22/2021   Respiratory distress 01/20/2021   Acute on chronic combined systolic and diastolic CHF (congestive heart failure) (HCC) 01/20/2021   Type 2 diabetes mellitus without complication, without long-term current use of insulin (HCC) 10/26/2017   Generalized anxiety disorder 01/10/2014   Vitamin D deficiency 01/10/2014   Essential hypertension, benign 01/10/2014   Pure hypercholesterolemia 01/10/2014   Hypothyroidism 01/10/2014    Past Medical History:  Diagnosis Date   Allergy    Anxiety    CHF (congestive heart failure) (HCC) 01/20/2021   COPD (chronic obstructive pulmonary disease) (HCC)    Coronary artery calcification    Depression    Diabetes mellitus without complication (HCC)    Diverticulitis    with colon resection   GERD (gastroesophageal reflux disease)    on pepcid   Hyperlipidemia    Hypertension    Sleep apnea    cpap on order 01-01-22   Sleep paralysis    will be tested for narcolepsy per pt 01-01-22   Thyroid disease     Current Outpatient Medications  Medication Sig Dispense Refill   ACCU-CHEK AVIVA PLUS test strip daily. for testing  2   albuterol (VENTOLIN HFA) 108 (90 Base) MCG/ACT inhaler Inhale 1 puff into the lungs every 6 (six) hours as needed for wheezing or shortness of breath.     ALPRAZolam (XANAX) 1 MG tablet Take 0.5 mg by mouth 3 (three) times daily as needed for anxiety or sleep.      aspirin 81 MG tablet Take 81 mg by  mouth daily.     BLACK ELDERBERRY PO Take 10 mLs by mouth daily.     cetirizine (ZYRTEC) 10 MG tablet Take 10 mg by mouth daily.     Continuous Blood Gluc Receiver (DEXCOM G6 RECEIVER) DEVI 1 Device by Does not apply route every morning. Device and supplies 1 each 0   dapagliflozin propanediol (FARXIGA) 10 MG TABS tablet Take 1 tablet (10 mg total) by mouth daily. 30 tablet 3   Dulaglutide 1.5 MG/0.5ML SOPN Inject 1.5 mg into the skin once a week. 6 mL 0    DULoxetine (CYMBALTA) 60 MG capsule TAKE 2 CAPSULES BY MOUTH DAILY 180 capsule 1   ezetimibe (ZETIA) 10 MG tablet TAKE 1 TABLET BY MOUTH EVERY DAY 90 tablet 0   famotidine (PEPCID) 20 MG tablet Take 20 mg by mouth daily as needed for heartburn or indigestion.     insulin glargine (LANTUS SOLOSTAR) 100 UNIT/ML Solostar Pen Inject 52 Units into the skin daily. (Patient taking differently: Inject 54 Units into the skin at bedtime.) 15 mL 5   Insulin Pen Needle 31G X 5 MM MISC Inject 1 each into the skin daily. 100 each 2   levothyroxine (SYNTHROID) 88 MCG tablet TAKE 1 TABLET BY MOUTH DAILY BEFORE BREAKFAST. 90 tablet 1   magnesium oxide (MAG-OX) 400 (240 Mg) MG tablet TAKE 0.5 TABLETS (200 MG TOTAL) BY MOUTH 2 TIMES DAILY. 90 tablet 1   metoprolol succinate (TOPROL-XL) 100 MG 24 hr tablet TAKE 1 TABLET BY MOUTH DAILY. TAKE WITH OR IMMEDIATELY FOLLOWING A MEAL. 90 tablet 2   nitroGLYCERIN (NITROSTAT) 0.4 MG SL tablet PLACE 1 TABLET UNDER THE TONGUE EVERY 5 MINUTES AS NEEDED FOR CHEST PAIN. IF YOU REQUIRE MORE THAN TWO TABLETS FIVE MINUTES APART GO TO THE NEAREST ER VIA EMS. (Patient taking differently: Place 0.4 mg under the tongue every 5 (five) minutes as needed for chest pain.) 25 tablet 1   ondansetron (ZOFRAN ODT) 4 MG disintegrating tablet Take 1 tablet (4 mg total) by mouth every 8 (eight) hours as needed for nausea or vomiting. 20 tablet 1   pravastatin (PRAVACHOL) 80 MG tablet TAKE 1 TABLET BY MOUTH EVERY EVENING 90 tablet 1   sacubitril-valsartan (ENTRESTO) 97-103 MG Take 1 tablet by mouth 2 (two) times daily. 14 tablet 0   spironolactone (ALDACTONE) 25 MG tablet TAKE 1 TABLET BY MOUTH EVERY DAY 90 tablet 1   Vitamin D, Ergocalciferol, (DRISDOL) 1.25 MG (50000 UNIT) CAPS capsule TAKE 1 CAPSULE (50,000 UNITS TOTAL) BY MOUTH EVERY 7 (SEVEN) DAYS 13 capsule 1   No current facility-administered medications for this visit.    Allergies  Allergen Reactions   Metformin And Related Other (See  Comments)    Could not swallow   Nicotine Rash    Only allergic to nicotine patch   Ace Inhibitors Cough    Cough with lisinopril.     Social History   Socioeconomic History   Marital status: Married    Spouse name: Not on file   Number of children: 2   Years of education: some college   Highest education level: Not on file  Occupational History   Occupation: Retired  Tobacco Use   Smoking status: Every Day    Years: 38.00    Types: Cigarettes   Smokeless tobacco: Never   Tobacco comments:    2-5 A DAY  Vaping Use   Vaping Use: Never used  Substance and Sexual Activity   Alcohol use: No  Alcohol/week: 0.0 standard drinks   Drug use: No   Sexual activity: Not Currently  Other Topics Concern   Not on file  Social History Narrative   Marital status: married      Children:  2 children; 4 grandchildren      Lives: with husband, 2 granddaughters      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   Left-handed.   Caffeine use: 2.5 cups caffeine per day.   Social Determinants of Health   Financial Resource Strain: Low Risk    Difficulty of Paying Living Expenses: Not very hard  Food Insecurity: No Food Insecurity   Worried About Charity fundraiser in the Last Year: Never true   Ran Out of Food in the Last Year: Never true  Transportation Needs: No Transportation Needs   Lack of Transportation (Medical): No   Lack of Transportation (Non-Medical): No  Physical Activity: Not on file  Stress: Not on file  Social Connections: Not on file  Intimate Partner Violence: Not on file    ROS Per hpi   Objective   Vitals as reported by the patient: There were no vitals filed for this visit.  There are no diagnoses linked to this encounter.  PLAN Suspect viral gastroenteritis Will send zofran with hopes she can get rehydrated and keep sugars stable. Return if worsening or failing to improve ER if weakness  worsens, zofran without effect, or blood in vomit or stool Patient encouraged to call clinic with any questions, comments, or concerns.  I discussed the assessment and treatment plan with the patient. The patient was provided an opportunity to ask questions and all were answered. The patient agreed with the plan and demonstrated an understanding of the instructions.   The patient was advised to call back or seek an in-person evaluation if the symptoms worsen or if the condition fails to improve as anticipated.  I provided 15 minutes of non-face-to-face time during this encounter.  Maximiano Coss, NP

## 2022-01-06 NOTE — Telephone Encounter (Signed)
This encounter was created in error - please disregard.

## 2022-01-14 ENCOUNTER — Other Ambulatory Visit: Payer: Self-pay

## 2022-01-14 ENCOUNTER — Telehealth: Payer: Self-pay | Admitting: Cardiology

## 2022-01-14 MED ORDER — SACUBITRIL-VALSARTAN 97-103 MG PO TABS: 1.0000 | ORAL_TABLET | Freq: Two times a day (BID) | ORAL | 0 refills | Status: DC

## 2022-01-14 NOTE — Telephone Encounter (Signed)
Pt needs a call regarding one of her medications. Requesting a call back from the nurse.

## 2022-01-15 ENCOUNTER — Encounter: Payer: Medicare Other | Admitting: Internal Medicine

## 2022-02-01 ENCOUNTER — Other Ambulatory Visit: Payer: Self-pay

## 2022-02-01 ENCOUNTER — Ambulatory Visit
Admission: EM | Admit: 2022-02-01 | Discharge: 2022-02-01 | Disposition: A | Discharge status: Home / Self Care | Payer: Medicare Other | Attending: Physician Assistant | Admitting: Physician Assistant

## 2022-02-01 ENCOUNTER — Encounter: Payer: Self-pay | Admitting: Emergency Medicine

## 2022-02-01 DIAGNOSIS — K529 Noninfective gastroenteritis and colitis, unspecified: Secondary | ICD-10-CM

## 2022-02-01 MED ORDER — ONDANSETRON 4 MG PO TBDP: 4.0000 mg | ORAL_TABLET | Freq: Three times a day (TID) | ORAL | 0 refills | Status: DC | PRN

## 2022-02-01 MED ORDER — ONDANSETRON 4 MG PO TBDP
4.0000 mg | ORAL_TABLET | Freq: Once | ORAL | Status: AC
  Administered 2022-02-01: 4 mg via ORAL

## 2022-02-01 NOTE — ED Provider Notes (Signed)
[facility]    CSN: [id number] Arrival date & time: 02/01/22  1436      History   Chief Complaint Chief Complaint  Patient presents with   Vomiting    HPI Felicia Hanson is a 66 y.o. female.   Patient here today for evaluation of nausea vomiting diarrhea that started about 130 this morning.  She states she has tried Zofran but continued to have some vomiting.  Her husband has similar symptoms.  She denies any blood in her stool.  She denies any significant abdominal pain.  She has not had fever.  The history is provided by the patient.   Past Medical History:  Diagnosis Date   Allergy    Anxiety    CHF (congestive heart failure) (HCC) 01/20/2021   COPD (chronic obstructive pulmonary disease) (HCC)    Coronary artery calcification    Depression    Diabetes mellitus without complication (HCC)    Diverticulitis    with colon resection   GERD (gastroesophageal reflux disease)    on pepcid   Hyperlipidemia    Hypertension    Sleep apnea    cpap on order 01-01-22   Sleep paralysis    will be tested for narcolepsy per pt 01-01-22   Thyroid disease     Patient Active Problem List   Diagnosis Date Noted   Shortness of breath at rest 05/09/2021   Loud snoring 03/24/2021   Excessive daytime sleepiness 03/24/2021   Acute on chronic combined systolic and diastolic HF (heart failure) (HCC) 03/24/2021   Panlobular emphysema (HCC) 03/24/2021   GAD (generalized anxiety disorder) 03/24/2021   Atypical chest pain 01/22/2021   Respiratory distress 01/20/2021   Acute on chronic combined systolic and diastolic CHF (congestive heart failure) (HCC) 01/20/2021   Type 2 diabetes mellitus without complication, without long-term current use of insulin (HCC) 10/26/2017   Generalized anxiety disorder 01/10/2014   Vitamin D deficiency 01/10/2014   Essential hypertension, benign 01/10/2014   Pure hypercholesterolemia 01/10/2014   Hypothyroidism 01/10/2014    Past Surgical  History:  Procedure Laterality Date   BREAST EXCISIONAL BIOPSY Left    CESAREAN SECTION     x2   CHOLECYSTECTOMY     COLON SURGERY     diverticulitis with perforation; s/p colon resection.   Colonoscopy     COLONOSCOPY     DENTAL SURGERY     RIGHT/LEFT HEART CATH AND CORONARY ANGIOGRAPHY N/A 01/22/2021   Procedure: RIGHT/LEFT HEART CATH AND CORONARY ANGIOGRAPHY;  Surgeon: Yates Decamp, MD;  Location: [facility];  Service: Cardiovascular;  Laterality: N/A;   TUBAL LIGATION      OB History   No obstetric history on file.      Home Medications    Prior to Admission medications   Medication Sig Start Date End Date Taking? Authorizing Provider  ondansetron (ZOFRAN-ODT) 4 MG disintegrating tablet Take 1 tablet (4 mg total) by mouth every 8 (eight) hours as needed. 02/01/22  Yes Tomi Bamberger, PA-C  ACCU-CHEK AVIVA PLUS test strip daily. for testing 03/11/18   [provider]  albuterol (VENTOLIN HFA) 108 (90 Base) MCG/ACT inhaler Inhale 1 puff into the lungs every 6 (six) hours as needed for wheezing or shortness of breath.    [provider]  ALPRAZolam Prudy Feeler) 1 MG tablet Take 0.5 mg by mouth 3 (three) times daily as needed for anxiety or sleep.  08/21/13   [provider]  aspirin 81 MG tablet Take 81  mg by mouth daily.    [provider]  BLACK ELDERBERRY PO Take 10 mLs by mouth daily.    [provider]  cetirizine (ZYRTEC) 10 MG tablet Take 10 mg by mouth daily.    [provider]  Continuous Blood Gluc Receiver (DEXCOM G6 RECEIVER) DEVI 1 Device by Does not apply route every morning. Device and supplies 04/18/20   Shade Flood, MD  dapagliflozin propanediol (FARXIGA) 10 MG TABS tablet Take 1 tablet (10 mg total) by mouth daily. 01/25/21   Regalado, Belkys A, MD  Dulaglutide 1.5 MG/0.5ML SOPN Inject 1.5 mg into the skin once a week. 09/11/21   Janeece Agee, NP  DULoxetine (CYMBALTA) 60 MG capsule TAKE 2 CAPSULES BY  MOUTH DAILY 06/06/21   Shade Flood, MD  ezetimibe (ZETIA) 10 MG tablet TAKE 1 TABLET BY MOUTH EVERY DAY 01/07/22   Shade Flood, MD  famotidine (PEPCID) 20 MG tablet Take 20 mg by mouth daily as needed for heartburn or indigestion.    [provider]  insulin glargine (LANTUS SOLOSTAR) 100 UNIT/ML Solostar Pen Inject 52 Units into the skin daily. Patient taking differently: Inject 54 Units into the skin at bedtime. 09/11/21   Janeece Agee, NP  Insulin Pen Needle 31G X 5 MM MISC Inject 1 each into the skin daily. 02/10/21   Shade Flood, MD  levothyroxine (SYNTHROID) 88 MCG tablet TAKE 1 TABLET BY MOUTH EVERY DAY BEFORE BREAKFAST 01/07/22   Shade Flood, MD  magnesium oxide (MAG-OX) 400 (240 Mg) MG tablet TAKE 0.5 TABLETS (200 MG TOTAL) BY MOUTH 2 TIMES DAILY. 01/06/22   Tolia, Sunit, DO  metoprolol succinate (TOPROL-XL) 100 MG 24 hr tablet TAKE 1 TABLET BY MOUTH DAILY. TAKE WITH OR IMMEDIATELY FOLLOWING A MEAL. 12/29/21   Tolia, Sunit, DO  nitroGLYCERIN (NITROSTAT) 0.4 MG SL tablet PLACE 1 TABLET UNDER THE TONGUE EVERY 5 MINUTES AS NEEDED FOR CHEST PAIN. IF YOU REQUIRE MORE THAN TWO TABLETS FIVE MINUTES APART GO TO THE NEAREST ER VIA EMS. Patient taking differently: Place 0.4 mg under the tongue every 5 (five) minutes as needed for chest pain. 07/29/20   Tolia, Sunit, DO  pravastatin (PRAVACHOL) 80 MG tablet TAKE 1 TABLET BY MOUTH EVERY EVENING 11/29/21   Shade Flood, MD  sacubitril-valsartan (ENTRESTO) 97-103 MG Take 1 tablet by mouth 2 (two) times daily. 01/14/22   Tolia, Sunit, DO  spironolactone (ALDACTONE) 25 MG tablet TAKE 1 TABLET BY MOUTH EVERY DAY 12/12/21   Shade Flood, MD  Vitamin D, Ergocalciferol, (DRISDOL) 1.25 MG (50000 UNIT) CAPS capsule TAKE 1 CAPSULE (50,000 UNITS TOTAL) BY MOUTH EVERY 7 (SEVEN) DAYS 08/13/21   Shade Flood, MD    Family History Family History  Problem Relation Age of Onset   Colon polyps Mother    Cancer Mother         unknown primary   Hyperlipidemia Mother    Other Father    Breast cancer Paternal Aunt    Esophageal cancer Paternal Uncle    Colon cancer Neg Hx    Rectal cancer Neg Hx    Stomach cancer Neg Hx     Social History Social History   Tobacco Use   Smoking status: Every Day    Years: 38.00    Types: Cigarettes   Smokeless tobacco: Never   Tobacco comments:    2-5 A DAY  Vaping Use   Vaping Use: Never used  Substance Use Topics   Alcohol  use: No    Alcohol/week: 0.0 standard drinks   Drug use: No     Allergies   Metformin and related, Nicotine, and Ace inhibitors   Review of Systems Review of Systems  Constitutional:  Negative for chills and fever.  Eyes:  Negative for discharge and redness.  Respiratory:  Negative for shortness of breath.   Cardiovascular:  Negative for chest pain.  Gastrointestinal:  Positive for diarrhea, nausea and vomiting. Negative for abdominal pain.  Musculoskeletal:  Negative for back pain.    Physical Exam Triage Vital Signs ED Triage Vitals  Enc Vitals Group     BP      Pulse      Resp      Temp      Temp src      SpO2      Weight      Height      Head Circumference      Peak Flow      Pain Score      Pain Loc      Pain Edu?      Excl. in GC?    No data found.  Updated Vital Signs BP 129/66 (BP Location: Left Arm)    Pulse (!) 104    Temp 98.4 F (36.9 C) (Oral)    Resp 18    SpO2 95%   Physical Exam Vitals and nursing note reviewed.  Constitutional:      General: She is not in acute distress.    Appearance: Normal appearance. She is not ill-appearing.  HENT:     Head: Normocephalic and atraumatic.  Eyes:     Conjunctiva/sclera: Conjunctivae normal.  Cardiovascular:     Rate and Rhythm: Normal rate and regular rhythm.     Heart sounds: Normal heart sounds. No murmur heard. Pulmonary:     Effort: Pulmonary effort is normal. No respiratory distress.     Breath sounds: Normal breath sounds. No wheezing, rhonchi or  rales.  Neurological:     Mental Status: She is alert.  Psychiatric:        Mood and Affect: Mood normal.        Behavior: Behavior normal.        Thought Content: Thought content normal.     UC Treatments / Results  Labs (all labs ordered are listed, but only abnormal results are displayed) Labs Reviewed - No data to display  EKG   Radiology No results found.  Procedures Procedures (including critical care time)  Medications Ordered in UC Medications  ondansetron (ZOFRAN-ODT) disintegrating tablet 4 mg (4 mg Oral Given 02/01/22 1514)    Initial Impression / Assessment and Plan / UC Course  I have reviewed the triage vital signs and the nursing notes.  Pertinent labs & imaging results that were available during my care of the patient were reviewed by me and considered in my medical decision making (see chart for details).     Suspect viral etiology of symptoms.  Zofran prescribed.  Recommended she report to ED if Zofran does not help her keep fluids down given her comorbidities as a suspect she will need IV fluids and stat labs.  Patient expresses understanding.  Final diagnoses:  Gastroenteritis   Discharge Instructions   None    ED Prescriptions     Medication Sig Dispense Auth. Provider   ondansetron (ZOFRAN-ODT) 4 MG disintegrating tablet Take 1 tablet (4 mg total) by mouth every 8 (eight) hours as needed. 20 tablet Izola Price,  Armandina Stammer, PA-C      PDMP not reviewed this encounter.   Tomi Bamberger, PA-C 02/01/22 1540

## 2022-02-01 NOTE — ED Triage Notes (Signed)
Pt here for N/V/D starting at 0130 this morning; pt sts took some zofran but still having vomiting; family with similar sx  ?

## 2022-02-05 ENCOUNTER — Other Ambulatory Visit: Payer: Self-pay

## 2022-02-05 MED ORDER — SACUBITRIL-VALSARTAN 97-103 MG PO TABS: 1.0000 | ORAL_TABLET | Freq: Two times a day (BID) | ORAL | 3 refills | Status: DC

## 2022-02-17 ENCOUNTER — Institutional Professional Consult (permissible substitution): Payer: Medicare Other | Admitting: Neurology

## 2022-02-24 ENCOUNTER — Telehealth: Payer: Self-pay | Admitting: Internal Medicine

## 2022-02-24 ENCOUNTER — Encounter: Payer: Medicare Other | Admitting: Internal Medicine

## 2022-02-24 ENCOUNTER — Other Ambulatory Visit: Payer: Self-pay | Admitting: Family Medicine

## 2022-02-24 DIAGNOSIS — R7989 Other specified abnormal findings of blood chemistry: Secondary | ICD-10-CM

## 2022-02-24 NOTE — Telephone Encounter (Signed)
Thanks for the info ? ?No cancellation charge ? ?Let her know she may reschedule when willing and I can also see her in office to discuss if desired ?

## 2022-02-24 NOTE — Telephone Encounter (Signed)
Pt called in regard to pt calling and canceling her procedure: Pt stated that her family has been really sick recently, she is having seasonal depression and is very anxious about having her procedure due to the recent episode of her husband aspirating: ?A brief time of prayer was had with pt; pt was very appreciative: ?Pt verbalized understanding with all questions answered.  ? ?  ?

## 2022-02-24 NOTE — Telephone Encounter (Signed)
Called patient to see if she was coming in for her procedure. Pt states she is not and she has high anxiety about having this done due to her husband having a double a few weeks ago and he aspirated and was in the ER for 4 days. I advised her that I would forward this to you and his RN. ?

## 2022-02-24 NOTE — Telephone Encounter (Signed)
Called patient to see if she was coming in for her procedure. Pt states she is not and she has high anxiety about having this done due to her husband having a double a few weeks ago and he aspirated and was in the ER for 4 days. I advised her that I would forward this to you and his RN. ?

## 2022-02-25 NOTE — Telephone Encounter (Signed)
Pt was made aware of Dr. Carlean Purl recommendations: ?Pt stated that she wanted to give it some time and  will give [place] a call back later: ?Pt verbalized understanding with all questions answered.  ? ?

## 2022-03-09 ENCOUNTER — Encounter: Payer: Self-pay | Admitting: Cardiology

## 2022-03-09 ENCOUNTER — Ambulatory Visit: Payer: Medicare Other | Admitting: Cardiology

## 2022-03-09 VITALS — BP 137/75 | HR 88 | Temp 97.3°F | Resp 16 | Ht 60.0 in | Wt 158.8 lb

## 2022-03-09 DIAGNOSIS — I447 Left bundle-branch block, unspecified: Secondary | ICD-10-CM

## 2022-03-09 DIAGNOSIS — Z794 Long term (current) use of insulin: Secondary | ICD-10-CM

## 2022-03-09 DIAGNOSIS — I428 Other cardiomyopathies: Secondary | ICD-10-CM

## 2022-03-09 DIAGNOSIS — I10 Essential (primary) hypertension: Secondary | ICD-10-CM

## 2022-03-09 DIAGNOSIS — I5042 Chronic combined systolic (congestive) and diastolic (congestive) heart failure: Secondary | ICD-10-CM

## 2022-03-09 DIAGNOSIS — E1165 Type 2 diabetes mellitus with hyperglycemia: Secondary | ICD-10-CM

## 2022-03-09 DIAGNOSIS — E7800 Pure hypercholesterolemia, unspecified: Secondary | ICD-10-CM

## 2022-03-09 DIAGNOSIS — I2510 Atherosclerotic heart disease of native coronary artery without angina pectoris: Secondary | ICD-10-CM

## 2022-03-09 DIAGNOSIS — F17210 Nicotine dependence, cigarettes, uncomplicated: Secondary | ICD-10-CM

## 2022-03-09 NOTE — Progress Notes (Signed)
? ?ID:  Felicia Hanson, DOB 24-Nov-1956, MRN [id number] ? ?PCP:  Wendie Agreste, MD  ?Cardiologist:  Rex Kras, DO, [facility] (established care 04/22/2020) ? ?Date: 03/09/22 ?Last Office Visit: 12/09/2021 ? ?Chief Complaint  ?Patient presents with  ? Follow-up  ?  3 month follow up heart failure  ? ? ?HPI  ? ?Felicia Hanson is a 66 y.o. female whose past medical history and cardiovascular risk factors are: COVID-19 infection, insulin-dependent type 2 diabetes with hyperglycemia, pure hypercholesterolemia, hypertriglyceridemia, coronary artery calcification, nonischemic cardiomyopathy, left bundle branch block, essential hypertension, hypothyroidism, tobacco use disorder, vitamin D deficiency, generalized anxiety disorder. ? ?Patient presents today for 28-month follow-up visit for heart failure management.  Since last office visit patient has not been hospitalized or seen in urgent care for cardiovascular symptoms.  Her last hospitalization for congestive heart failure was in February 2022.  Her medications have been uptitrated in a stepwise fashion.  Since last office visit she is increasing physical activity by going on for walks with her granddaughter.  She continues to smoke 2-4 cigarettes/day daily stress/anxiety.  Her A1c has improved significantly compared to her prior levels. ? ?FUNCTIONAL STATUS: ?No structured exercise program or daily routine.  But does actively take care of her grandkids. ? ?ALLERGIES: ?Allergies  ?Allergen Reactions  ? Metformin And Related Other (See Comments)  ?  Could not swallow  ? Nicotine Rash  ?  Only allergic to nicotine patch  ? Ace Inhibitors Cough  ?  Cough with lisinopril.   ? ? ?MEDICATION LIST PRIOR TO VISIT: ?Current Meds  ?Medication Sig  ? ACCU-CHEK AVIVA PLUS test strip daily. for testing  ? albuterol (VENTOLIN HFA) 108 (90 Base) MCG/ACT inhaler Inhale 1 puff into the lungs every 6 (six) hours as needed for wheezing or shortness of breath.  ? ALPRAZolam (XANAX) 1 MG tablet  Take 0.5 mg by mouth 3 (three) times daily as needed for anxiety or sleep.   ? aspirin 81 MG tablet Take 81 mg by mouth daily.  ? BLACK ELDERBERRY PO Take 10 mLs by mouth daily.  ? cetirizine (ZYRTEC) 10 MG tablet Take 10 mg by mouth daily.  ? Continuous Blood Gluc Receiver (DEXCOM G6 RECEIVER) DEVI 1 Device by Does not apply route every morning. Device and supplies  ? dapagliflozin propanediol (FARXIGA) 10 MG TABS tablet Take 1 tablet (10 mg total) by mouth daily.  ? Dulaglutide 1.5 MG/0.5ML SOPN Inject 1.5 mg into the skin once a week.  ? DULoxetine (CYMBALTA) 60 MG capsule TAKE 2 CAPSULES BY MOUTH DAILY  ? ezetimibe (ZETIA) 10 MG tablet TAKE 1 TABLET BY MOUTH EVERY DAY  ? famotidine (PEPCID) 20 MG tablet Take 20 mg by mouth daily as needed for heartburn or indigestion.  ? insulin glargine (LANTUS SOLOSTAR) 100 UNIT/ML Solostar Pen Inject 52 Units into the skin daily. (Patient taking differently: Inject 54 Units into the skin at bedtime.)  ? Insulin Pen Needle 31G X 5 MM MISC Inject 1 each into the skin daily.  ? levothyroxine (SYNTHROID) 88 MCG tablet TAKE 1 TABLET BY MOUTH EVERY DAY BEFORE BREAKFAST  ? magnesium oxide (MAG-OX) 400 (240 Mg) MG tablet TAKE 0.5 TABLETS (200 MG TOTAL) BY MOUTH 2 TIMES DAILY.  ? metoprolol succinate (TOPROL-XL) 100 MG 24 hr tablet TAKE 1 TABLET BY MOUTH DAILY. TAKE WITH OR IMMEDIATELY FOLLOWING A MEAL.  ? nitroGLYCERIN (NITROSTAT) 0.4 MG SL tablet PLACE 1 TABLET UNDER THE TONGUE EVERY 5 MINUTES AS NEEDED FOR CHEST PAIN.  IF YOU REQUIRE MORE THAN TWO TABLETS FIVE MINUTES APART GO TO THE NEAREST ER VIA EMS. (Patient taking differently: Place 0.4 mg under the tongue every 5 (five) minutes as needed for chest pain.)  ? ondansetron (ZOFRAN-ODT) 4 MG disintegrating tablet Take 1 tablet (4 mg total) by mouth every 8 (eight) hours as needed.  ? pravastatin (PRAVACHOL) 80 MG tablet TAKE 1 TABLET BY MOUTH EVERY EVENING  ? sacubitril-valsartan (ENTRESTO) 97-103 MG Take 1 tablet by mouth 2 (two)  times daily.  ? spironolactone (ALDACTONE) 25 MG tablet TAKE 1 TABLET BY MOUTH EVERY DAY  ? Vitamin D, Ergocalciferol, (DRISDOL) 1.25 MG (50000 UNIT) CAPS capsule TAKE 1 CAPSULE (50,000 UNITS TOTAL) BY MOUTH EVERY 7 (SEVEN) DAYS  ?  ? ?PAST MEDICAL HISTORY: ?Past Medical History:  ?Diagnosis Date  ? Allergy   ? Anxiety   ? CHF (congestive heart failure) ([place]) 01/20/2021  ? COPD (chronic obstructive pulmonary disease) ([place])   ? Coronary artery calcification   ? Depression   ? Diabetes mellitus without complication ([place])   ? Diverticulitis   ? with colon resection  ? GERD (gastroesophageal reflux disease)   ? on pepcid  ? Hyperlipidemia   ? Hypertension   ? Sleep apnea   ? cpap on order 01-01-22  ? Sleep paralysis   ? will be tested for narcolepsy per pt 01-01-22  ? Thyroid disease   ? ? ?PAST SURGICAL HISTORY: ?Past Surgical History:  ?Procedure Laterality Date  ? BREAST EXCISIONAL BIOPSY Left   ? CESAREAN SECTION    ? x2  ? CHOLECYSTECTOMY    ? COLON SURGERY    ? diverticulitis with perforation; s/p colon resection.  ? Colonoscopy    ? COLONOSCOPY    ? DENTAL SURGERY    ? RIGHT/LEFT HEART CATH AND CORONARY ANGIOGRAPHY N/A 01/22/2021  ? Procedure: RIGHT/LEFT HEART CATH AND CORONARY ANGIOGRAPHY;  Surgeon: Adrian Prows, MD;  Location: [facility];  Service: Cardiovascular;  Laterality: N/A;  ? TUBAL LIGATION    ? ? ?FAMILY HISTORY: ?The patient family history includes Breast cancer in her paternal aunt; Cancer in her mother; Colon polyps in her mother; Esophageal cancer in her paternal uncle; Hyperlipidemia in her mother; Other in her father. ? ?SOCIAL HISTORY:  ?The patient  reports that she has been smoking cigarettes. She has a 9.50 pack-year smoking history. She has never used smokeless tobacco. She reports that she does not drink alcohol and does not use drugs. ? ?REVIEW OF SYSTEMS: ?Review of Systems  ?Constitutional: Negative for chills and fever.  ?HENT:  Negative for hoarse voice and nosebleeds.   ?Eyes:   Negative for discharge, double vision and pain.  ?Cardiovascular:  Negative for chest pain, claudication, dyspnea on exertion, leg swelling, near-syncope, orthopnea, palpitations, paroxysmal nocturnal dyspnea and syncope.  ?Respiratory:  Negative for hemoptysis and shortness of breath.   ?Musculoskeletal:  Negative for muscle cramps and myalgias.  ?Gastrointestinal:  Negative for abdominal pain, constipation, diarrhea, hematemesis, hematochezia, melena, nausea and vomiting.  ?Neurological:  Negative for dizziness and light-headedness.  ? ?PHYSICAL EXAM: ? ?  03/09/2022  ?  2:42 PM 02/01/2022  ?  3:10 PM 01/01/2022  ? 10:25 AM  ?Vitals with BMI  ?Height 5\' 0"   5\' 0"   ?Weight 158 lbs 13 oz  158 lbs  ?BMI 31.01  30.86  ?Systolic 0000000 [id number]   ?Diastolic 75 66   ?Pulse 88 104   ? ? ?CONSTITUTIONAL: Well-developed and well-nourished. No acute distress.  ?SKIN:  Skin is warm and dry. No rash noted. No cyanosis. No pallor. No jaundice ?HEAD: Normocephalic and atraumatic.  ?EYES: No scleral icterus ?MOUTH/THROAT: Moist oral membranes.  ?NECK: No JVD present. No thyromegaly noted. No carotid bruits  ?LYMPHATIC: No visible cervical adenopathy.  ?CHEST Normal respiratory effort. No intercostal retractions  ?LUNGS: Clear to auscultation bilaterally.  No stridor. No wheezes. No rales.  ?CARDIOVASCULAR: Regular rate and rhythm, positive S1-S2, no murmurs rubs or gallops appreciated. ?ABDOMINAL: Obese, soft, nontender, nondistended, positive bowel sounds in all 4 quadrants.  No apparent ascites.  ?EXTREMITIES: No peripheral edema, warm to touch.  ?HEMATOLOGIC: No significant bruising ?NEUROLOGIC: Oriented to person, place, and time. Nonfocal. Normal muscle tone.  ?PSYCHIATRIC: Normal mood and affect. Normal behavior. Cooperative ? ?CARDIAC DATABASE: ? ?EKG:  ?April 10th 2023: NSR, 83bpm, LBBB, LAE, without underlying injury pattern. ? ?Coronary calcium scoring 05/13/2015: LAD: 8, Cx: 2. Total calcium score is 10.   ? ?Echocardiogram: ?01/20/2021: LVEF 35-40% ? ?05/15/2021: ?Technically difficult study. ?Left ventricle cavity is normal in size. Moderate concentric hypertrophy of the left ventricle. Mild global hypokinesis. LVEF 40-45%. Horald Pollen

## 2022-03-12 ENCOUNTER — Telehealth: Payer: Self-pay

## 2022-03-12 ENCOUNTER — Ambulatory Visit: Payer: Medicare Other

## 2022-03-12 NOTE — Telephone Encounter (Signed)
Felicia Hanson left voice mail message for appointment.  Patient may reschedule for next available appointment. ? ?L.Ammaar Encina,LPN  ?

## 2022-03-13 ENCOUNTER — Telehealth: Payer: Self-pay | Admitting: Family Medicine

## 2022-03-13 NOTE — Telephone Encounter (Signed)
Patient had AWV scheduled on 03/12/22. Patient didn't answer when she was called for appointment.  I called patient to rescheduled AWV and I spoke to patient's husband. He said he'll have patient call the office to reschedule AWV with [facility]. ?

## 2022-03-15 ENCOUNTER — Ambulatory Visit (HOSPITAL_COMMUNITY)
Admission: EM | Admit: 2022-03-15 | Discharge: 2022-03-15 | Disposition: A | Discharge status: Home / Self Care | Payer: Medicare Other | Attending: Emergency Medicine | Admitting: Emergency Medicine

## 2022-03-15 ENCOUNTER — Other Ambulatory Visit: Payer: Self-pay

## 2022-03-15 ENCOUNTER — Encounter (HOSPITAL_COMMUNITY): Payer: Self-pay | Admitting: *Deleted

## 2022-03-15 DIAGNOSIS — R059 Cough, unspecified: Secondary | ICD-10-CM | POA: Insufficient documentation

## 2022-03-15 DIAGNOSIS — J069 Acute upper respiratory infection, unspecified: Secondary | ICD-10-CM | POA: Diagnosis present

## 2022-03-15 DIAGNOSIS — Z20822 Contact with and (suspected) exposure to covid-19: Secondary | ICD-10-CM | POA: Diagnosis not present

## 2022-03-15 LAB — POC INFLUENZA A AND B ANTIGEN (URGENT CARE ONLY)
INFLUENZA A ANTIGEN, POC: NEGATIVE
INFLUENZA B ANTIGEN, POC: NEGATIVE

## 2022-03-15 MED ORDER — PROMETHAZINE-DM 6.25-15 MG/5ML PO SYRP: 5.0000 mL | ORAL_SOLUTION | Freq: Four times a day (QID) | ORAL | 0 refills | Status: DC | PRN

## 2022-03-15 MED ORDER — FLUTICASONE PROPIONATE 50 MCG/ACT NA SUSP: 2.0000 | Freq: Every day | NASAL | 0 refills | Status: AC

## 2022-03-15 NOTE — Discharge Instructions (Signed)
Your influenza test is negative today.  Your COVID results should be available in the next 24 hours.  If you have access to MyChart, you will be able to see the results there. ?Take your Zofran as needed for nausea.  I recommend a brat diet (bananas, rice, applesauce, and toast) until your nausea improves. ?Increase fluids and get plenty of rest. ?Take medication as prescribed.  The medication have given you for your cough will make you drowsy, so no driving, operate any heavy equipment, or drinking alcohol. ?Follow-up with your primary care physician if symptoms do not improve. ?

## 2022-03-15 NOTE — ED Provider Notes (Signed)
?[facility] ? ? ? ?CSN: [id number] ?Arrival date & time: 03/15/22  1621 ? ? ?  ? ?History   ?Chief Complaint ?Chief Complaint  ?Patient presents with  ? Headache  ? Cough  ? Nasal Congestion  ? Fever  ? Emesis  ? ? ?HPI ?Felicia Hanson is a 66 y.o. female.  ? ?The 21-year-old female who presents with flulike symptoms.  The patient complains of fever, cough, nasal congestion, sore throat and vomiting.  Symptoms started last evening.  She states that her fever was as high as 100.8.  She states that she has also vomited 3-4 times today.  She has not since she vomited.  She also has some nasal congestion and a dry cough.  She states that the cough kept her up most of the night.  She admits to sick contacts with her grandchildren.  She denies ear pain, shortness of breath, wheezing, or diarrhea.  The patient states she did contact her PCP for an antibiotic, but she was told to come to urgent care.  She has a prescription for Zofran, but she is not taking any medication for her nausea. ? ?The history is provided by the patient.  ? ?Past Medical History:  ?Diagnosis Date  ? Allergy   ? Anxiety   ? CHF (congestive heart failure) (HCC) 01/20/2021  ? COPD (chronic obstructive pulmonary disease) (HCC)   ? Coronary artery calcification   ? Depression   ? Diabetes mellitus without complication (HCC)   ? Diverticulitis   ? with colon resection  ? GERD (gastroesophageal reflux disease)   ? on pepcid  ? Hyperlipidemia   ? Hypertension   ? Sleep apnea   ? cpap on order 01-01-22  ? Sleep paralysis   ? will be tested for narcolepsy per pt 01-01-22  ? Thyroid disease   ? ? ?Patient Active Problem List  ? Diagnosis Date Noted  ? Shortness of breath at rest 05/09/2021  ? Loud snoring 03/24/2021  ? Excessive daytime sleepiness 03/24/2021  ? Acute on chronic combined systolic and diastolic HF (heart failure) (HCC) 03/24/2021  ? Panlobular emphysema (HCC) 03/24/2021  ? GAD (generalized anxiety disorder) 03/24/2021  ? Atypical chest  pain 01/22/2021  ? Respiratory distress 01/20/2021  ? Acute on chronic combined systolic and diastolic CHF (congestive heart failure) (HCC) 01/20/2021  ? Type 2 diabetes mellitus without complication, without long-term current use of insulin (HCC) 10/26/2017  ? Generalized anxiety disorder 01/10/2014  ? Vitamin D deficiency 01/10/2014  ? Essential hypertension, benign 01/10/2014  ? Pure hypercholesterolemia 01/10/2014  ? Hypothyroidism 01/10/2014  ? ? ?Past Surgical History:  ?Procedure Laterality Date  ? BREAST EXCISIONAL BIOPSY Left   ? CESAREAN SECTION    ? x2  ? CHOLECYSTECTOMY    ? COLON SURGERY    ? diverticulitis with perforation; s/p colon resection.  ? Colonoscopy    ? COLONOSCOPY    ? DENTAL SURGERY    ? RIGHT/LEFT HEART CATH AND CORONARY ANGIOGRAPHY N/A 01/22/2021  ? Procedure: RIGHT/LEFT HEART CATH AND CORONARY ANGIOGRAPHY;  Surgeon: Yates Decamp, MD;  [facility];  Service: Cardiovascular;  Laterality: N/A;  ? TUBAL LIGATION    ? ? ?OB History   ?No obstetric history on file. ?  ? ? ? ?Home Medications   ? ?Prior to Admission medications   ?Medication Sig Start Date End Date Taking? Authorizing Provider  ?fluticasone (FLONASE) 50 MCG/ACT nasal spray Place 2 sprays into both nostrils daily. 03/15/22  Yes Brentney Goldbach-Warren, Sadie Haber, NP  ?promethazine-dextromethorphan (PROMETHAZINE-DM) 6.25-15 MG/5ML syrup Take 5 mLs by mouth 4 (four) times daily as needed for cough. 03/15/22  Yes Ayat Drenning-Warren, Sadie Haber, NP  ?ACCU-CHEK AVIVA PLUS test strip daily. for testing 03/11/18   [provider]  ?albuterol (VENTOLIN HFA) 108 (90 Base) MCG/ACT inhaler Inhale 1 puff into the lungs every 6 (six) hours as needed for wheezing or shortness of breath.    [provider]  ?ALPRAZolam Prudy Feeler) 1 MG tablet Take 0.5 mg by mouth 3 (three) times daily as needed for anxiety or sleep.  08/21/13   [provider]  ?aspirin 81 MG tablet Take 81 mg by mouth daily.    [provider]   ?BLACK ELDERBERRY PO Take 10 mLs by mouth daily.    [provider]  ?cetirizine (ZYRTEC) 10 MG tablet Take 10 mg by mouth daily.    [provider]  ?Continuous Blood Gluc Receiver (DEXCOM G6 RECEIVER) DEVI 1 Device by Does not apply route every morning. Device and supplies 04/18/20   Shade Flood, MD  ?dapagliflozin propanediol (FARXIGA) 10 MG TABS tablet Take 1 tablet (10 mg total) by mouth daily. 01/25/21   Regalado, Belkys A, MD  ?Dulaglutide 1.5 MG/0.5ML SOPN Inject 1.5 mg into the skin once a week. 09/11/21   Janeece Agee, NP  ?DULoxetine (CYMBALTA) 60 MG capsule TAKE 2 CAPSULES BY MOUTH DAILY 06/06/21   Shade Flood, MD  ?ezetimibe (ZETIA) 10 MG tablet TAKE 1 TABLET BY MOUTH EVERY DAY 01/07/22   Shade Flood, MD  ?famotidine (PEPCID) 20 MG tablet Take 20 mg by mouth daily as needed for heartburn or indigestion.    [provider]  ?insulin glargine (LANTUS SOLOSTAR) 100 UNIT/ML Solostar Pen Inject 52 Units into the skin daily. ?Patient taking differently: Inject 54 Units into the skin at bedtime. 09/11/21   Janeece Agee, NP  ?Insulin Pen Needle 31G X 5 MM MISC Inject 1 each into the skin daily. 02/10/21   Shade Flood, MD  ?levothyroxine (SYNTHROID) 88 MCG tablet TAKE 1 TABLET BY MOUTH EVERY DAY BEFORE BREAKFAST 01/07/22   Shade Flood, MD  ?magnesium oxide (MAG-OX) 400 (240 Mg) MG tablet TAKE 0.5 TABLETS (200 MG TOTAL) BY MOUTH 2 TIMES DAILY. 01/06/22   Tolia, Sunit, DO  ?metoprolol succinate (TOPROL-XL) 100 MG 24 hr tablet TAKE 1 TABLET BY MOUTH DAILY. TAKE WITH OR IMMEDIATELY FOLLOWING A MEAL. 12/29/21   Tolia, Sunit, DO  ?nitroGLYCERIN (NITROSTAT) 0.4 MG SL tablet PLACE 1 TABLET UNDER THE TONGUE EVERY 5 MINUTES AS NEEDED FOR CHEST PAIN. IF YOU REQUIRE MORE THAN TWO TABLETS FIVE MINUTES APART GO TO THE NEAREST ER VIA EMS. ?Patient taking differently: Place 0.4 mg under the tongue every 5 (five) minutes as needed for chest pain. 07/29/20   Tolia, Sunit, DO   ?ondansetron (ZOFRAN-ODT) 4 MG disintegrating tablet Take 1 tablet (4 mg total) by mouth every 8 (eight) hours as needed. 02/01/22   Tomi Bamberger, PA-C  ?pravastatin (PRAVACHOL) 80 MG tablet TAKE 1 TABLET BY MOUTH EVERY EVENING 11/29/21   Shade Flood, MD  ?sacubitril-valsartan (ENTRESTO) 97-103 MG Take 1 tablet by mouth 2 (two) times daily. 02/05/22   Tolia, Sunit, DO  ?spironolactone (ALDACTONE) 25 MG tablet TAKE 1 TABLET BY MOUTH EVERY DAY 12/12/21   Shade Flood, MD  ?Vitamin D, Ergocalciferol, (DRISDOL) 1.25 MG (50000 UNIT) CAPS capsule TAKE 1 CAPSULE (50,000 UNITS TOTAL) BY MOUTH EVERY 7 (SEVEN) DAYS  08/13/21   Shade Flood, MD  ? ? ?Family History ?Family History  ?Problem Relation Age of Onset  ? Colon polyps Mother   ? Cancer Mother   ?     unknown primary  ? Hyperlipidemia Mother   ? Other Father   ? Breast cancer Paternal Aunt   ? Esophageal cancer Paternal Uncle   ? Colon cancer Neg Hx   ? Rectal cancer Neg Hx   ? Stomach cancer Neg Hx   ? ? ?Social History ?Social History  ? ?Tobacco Use  ? Smoking status: Every Day  ?  Packs/day: 0.25  ?  Years: 38.00  ?  Pack years: 9.50  ?  Types: Cigarettes  ? Smokeless tobacco: Never  ? Tobacco comments:  ?  2-5 A DAY  ?Vaping Use  ? Vaping Use: Never used  ?Substance Use Topics  ? Alcohol use: No  ?  Alcohol/week: 0.0 standard drinks  ? Drug use: No  ? ? ? ?Allergies   ?Metformin and related, Nicotine, and Ace inhibitors ? ? ?Review of Systems ?Review of Systems  ?Constitutional:  Positive for activity change, appetite change, fatigue and fever.  ?HENT:  Positive for congestion and sore throat.   ?Eyes: Negative.   ?Respiratory:  Positive for cough. Negative for shortness of breath and wheezing.   ?Cardiovascular: Negative.   ?Gastrointestinal:  Positive for nausea and vomiting. Negative for abdominal pain.  ?Skin: Negative.   ?Psychiatric/Behavioral: Negative.    ? ? ?Physical Exam ?Triage Vital Signs ?ED Triage Vitals  ?Enc Vitals Group  ?   BP  03/15/22 1747 120/77  ?   Pulse Rate 03/15/22 1747 100  ?   Resp 03/15/22 1747 18  ?   Temp 03/15/22 1747 99.5 ?F (37.5 ?C)  ?   Temp src --   ?   SpO2 03/15/22 1747 94 %  ?   Weight --   ?   Height --   ?   Head C

## 2022-03-15 NOTE — ED Triage Notes (Signed)
Reports vomiting,cough ,Nasal congestion,fever. ?

## 2022-03-16 ENCOUNTER — Ambulatory Visit: Payer: Medicare Other | Admitting: Family Medicine

## 2022-03-16 ENCOUNTER — Telehealth: Payer: Self-pay | Admitting: Family Medicine

## 2022-03-16 LAB — SARS CORONAVIRUS 2 (TAT 6-24 HRS): SARS Coronavirus 2: NEGATIVE

## 2022-03-16 NOTE — Telephone Encounter (Signed)
Chief Complaint Headache ?Reason for Call Symptomatic / Request for Health Information ?Initial Comment Caller states that she is running a fever 100.4, ?congestion in chest and ears, HA, ST with a cough ?and runny nose. Negative for COVID. ?Translation No ?Nurse Assessment ?Nurse: Carylon Perches, RN, Hilda Lias Date/Time Felicia Hanson Time): 03/15/2022 3:08:59 PM ?Confirm and document reason for call. If ?symptomatic, describe symptoms. ?---Caller states that she is running a fever 100.4, ?congestion in chest and ears, HA, sore throat, cough ?and runny nose. Negative for COVID. ?Does the patient have any new or worsening ?symptoms? ---Yes ?Will a triage be completed? ---Yes ?Related visit to physician within the last 2 weeks? ---No ?Does the PT have any chronic conditions? (i.e. ?diabetes, asthma, this includes High risk factors for ?pregnancy, etc.) ?---Yes ?List chronic conditions. ---CHF, diabetes ?Is this a behavioral health or substance abuse call? ---No ?Guidelines ?Guideline Title Affirmed Question Affirmed Notes Nurse Date/Time (Eastern ?Time) ?Influenza - Seasonal [1] Fever > 100.0 F ?(37.8 C) AND [2] ?diabetes mellitus ?or weak immune ?system (e.g., HIV ?positive, cancer ?chemo, splenectomy, ?Carylon Perches, RN, Hilda Lias 03/15/2022 3:11:46 ?PM ?PLEASE NOTE: All timestamps contained within this report are represented as [place] Standard Time. ?CONFIDENTIALTY NOTICE: This fax transmission is intended only for the addressee. It contains information that is legally privileged, confidential or ?otherwise protected from use or disclosure. If you are not the intended recipient, you are strictly prohibited from reviewing, disclosing, copying using ?or disseminating any of this information or taking any action in reliance on or regarding this information. If you have received this fax in error, please ?notify us immediately by telephone so that we can arrange for its return to [place]. Phone: [phone], Toll-Free: [phone], Fax:  [phone] ?Page: 2 of 2 ?Call Id: [id number] ?Guidelines ?Guideline Title Affirmed Question Affirmed Notes Nurse Date/Time (Eastern ?Time) ?organ transplant, ?chronic steroids) ?Disp. Time (Eastern ?Time) Disposition Final User ?03/15/2022 2:49:15 PM Attempt made - message left Carylon Perches, RN, Hilda Lias ?03/15/2022 3:16:46 PM See HCP within 4 Hours (or ?PCP triage) ?Yes Carylon Perches, RN, Hilda Lias ?Caller Disagree/Comply Comply ?Caller Understands Yes ?PreDisposition Did not know what to do ?Care Advice Given Per Guideline ?SEE HCP (OR PCP TRIAGE) WITHIN 4 HOURS: * IF OFFICE WILL BE CLOSED AND NO PCP (PRIMARY CARE ?PROVIDER) SECOND-LEVEL TRIAGE: You need to be seen within the next 3 or 4 hours. A nearby Urgent Care Center [facility]) is ?often a good source of care. Another choice is to go to the ED. Go sooner if you become worse. ?Referrals ?GO TO FACILITY UNDECIDED ?

## 2022-03-18 ENCOUNTER — Ambulatory Visit: Payer: Medicare Other | Admitting: Family Medicine

## 2022-03-18 ENCOUNTER — Ambulatory Visit (INDEPENDENT_AMBULATORY_CARE_PROVIDER_SITE_OTHER)
Admission: RE | Admit: 2022-03-18 | Discharge: 2022-03-18 | Disposition: A | Discharge status: Home / Self Care | Payer: Medicare Other | Source: Ambulatory Visit | Attending: Family Medicine | Admitting: Family Medicine

## 2022-03-18 ENCOUNTER — Encounter: Payer: Self-pay | Admitting: Family Medicine

## 2022-03-18 VITALS — BP 128/76 | HR 79 | Temp 98.3°F | Resp 16 | Ht 60.0 in | Wt 157.6 lb

## 2022-03-18 DIAGNOSIS — R062 Wheezing: Secondary | ICD-10-CM

## 2022-03-18 DIAGNOSIS — R051 Acute cough: Secondary | ICD-10-CM

## 2022-03-18 DIAGNOSIS — J988 Other specified respiratory disorders: Secondary | ICD-10-CM | POA: Diagnosis not present

## 2022-03-18 DIAGNOSIS — J22 Unspecified acute lower respiratory infection: Secondary | ICD-10-CM | POA: Diagnosis not present

## 2022-03-18 DIAGNOSIS — I5042 Chronic combined systolic (congestive) and diastolic (congestive) heart failure: Secondary | ICD-10-CM

## 2022-03-18 MED ORDER — PREDNISONE 20 MG PO TABS: 40.0000 mg | ORAL_TABLET | Freq: Every day | ORAL | 0 refills | Status: DC

## 2022-03-18 MED ORDER — AZITHROMYCIN 250 MG PO TABS: ORAL_TABLET | ORAL | 0 refills | Status: AC

## 2022-03-18 NOTE — Patient Instructions (Addendum)
Coricidin or plain mucinex (blue container) for cough ok.  Cough syrup at bedtime as prescribed by urgent care should help. ?Albuterol if needed for wheezing temporarily, but prednisone and antibiotic should help with cough as well.  Monitor your blood sugar on the prednisone.  If you get readings over 250 call to discuss possible medication changes. ?Return to the clinic or go to the nearest emergency room if any of your symptoms worsen or new symptoms occur. ? ? ?Xray today: ?[facility] for xray.  ?Walk in 8:30-4:30 during weekdays, no appointment needed ?[address]  ?[place], [place] [address] ? ?Cough, Adult ?Coughing is a reflex that clears your throat and your airways (respiratory system). Coughing helps to heal and protect your lungs. It is normal to cough occasionally, but a cough that happens with other symptoms or lasts a long time may be a sign of a condition that needs treatment. An acute cough may only last 2-3 weeks, while a chronic cough may last 8 or more weeks. ?Coughing is commonly caused by: ?Infection of the respiratory systemby viruses or bacteria. ?Breathing in substances that irritate your lungs. ?Allergies. ?Asthma. ?Mucus that runs down the back of your throat (postnasal drip). ?Smoking. ?Acid backing up from the stomach into the esophagus (gastroesophageal reflux). ?Certain medicines. ?Chronic lung problems. ?Other medical conditions such as heart failure or a blood clot in the lung (pulmonary embolism). ?Follow these instructions at home: ?Medicines ?Take over-the-counter and prescription medicines only as told by your health care provider. ?Talk with your health care provider before you take a cough suppressant medicine. ?Lifestyle ? ?Avoid cigarette smoke. Do not use any products that contain nicotine or tobacco, such as cigarettes, e-cigarettes, and chewing tobacco. If you need help quitting, ask your health care provider. ?Drink enough fluid to keep your urine pale yellow. ?Avoid  caffeine. ?Do not drink alcohol if your health care provider tells you not to drink. ?General instructions ? ?Pay close attention to changes in your cough. Tell your health care provider about them. ?Always cover your mouth when you cough. ?Avoid things that make you cough, such as perfume, candles, cleaning products, or campfire or tobacco smoke. ?If the air is dry, use a cool mist vaporizer or humidifier in your bedroom or your home to help loosen secretions. ?If your cough is worse at night, try to sleep in a semi-upright position. ?Rest as needed. ?Keep all follow-up visits as told by your health care provider. This is important. ?Contact a health care provider if you: ?Have new symptoms. ?Cough up pus. ?Have a cough that does not get better after 2-3 weeks or gets worse. ?Cannot control your cough with cough suppressant medicines and you are losing sleep. ?Have pain that gets worse or pain that is not helped with medicine. ?Have a fever. ?Have unexplained weight loss. ?Have night sweats. ?Get help right away if: ?You cough up blood. ?You have difficulty breathing. ?Your heartbeat is very fast. ?These symptoms may represent a serious problem that is an emergency. Do not wait to see if the symptoms will go away. Get medical help right away. Call your local emergency services (911 in the [place] Do not drive yourself to the hospital. ?Summary ?Coughing is a reflex that clears your throat and your airways. It is normal to cough occasionally, but a cough that happens with other symptoms or lasts a long time may be a sign of a condition that needs treatment. ?Take over-the-counter and prescription medicines only as told by  your health care provider. ?Always cover your mouth when you cough. ?Contact a health care provider if you have new symptoms or a cough that does not get better after 2-3 weeks or gets worse. ?This information is not intended to replace advice given to you by your health care provider. Make sure you  discuss any questions you have with your health care provider. ?Document Revised: 12/05/2018 Document Reviewed: 12/05/2018 ?Elsevier Patient Education ? 2023 Elsevier Inc. ? ?

## 2022-03-18 NOTE — Progress Notes (Signed)
? ?Subjective:  ?Patient ID: Felicia Hanson, female    DOB: 07/05/1956  Age: 66 y.o. MRN: [id number] ? ?CC:  ?Chief Complaint  ?Patient presents with  ? Cough  ?  Pt reports Thursday sxs started late evening, headache, cough, congestion, had fever of 102.6 highest, N&V, feels has chest congestion now, 2 negative COVID tests   ? Depression  ?  PHQ9 score =13 pt does note a lot of this has been situational with her feeling under the weather   ? ? ?HPI ?[name] presents for  ? ?Cough: ?Started about 6 days ago with headache, sore throat, cough, congestion.  Fatigue. Fever with Tmax 102.6.  Some nausea/vomiting 2nd day only - not recently.  Primarily chest congestion at this time.  2 negative home covid tests.  ?Last fever yesterday - 101. Fever past few days.  ?Drinking fluids. Able to eat past few days.  ?No dyspnea.  ?2 grandchildren sick recently.  ?Seen at urgent care 3 days ago - neg flu and covid. diagnosed viral illness, Rx phenergan cough syrup - every 6 hours, and flonase ns.  ?Cough some better. Some trouble sleeping. ?Using inhaler once per day. Some wheezing past few days.  ?Hx of CHF. No chest pain recently.  ? ?Positive depression screen noted.  She is on medication.  Some situational symptoms with illnesses above.  No suicidal ideation. ? ?  03/18/2022  ?  8:04 AM 01/06/2022  ?  3:07 PM 12/15/2021  ? 11:20 AM 12/03/2021  ? 11:33 AM 09/11/2021  ? 11:20 AM  ?Depression screen PHQ 2/9  ?Decreased Interest 2 0 1 1 1   ?Down, Depressed, Hopeless 2 0 1 1 1   ?PHQ - 2 Score 4 0 2 2 2   ?Altered sleeping 3 0 3 2 1   ?Tired, decreased energy 2 0 1 1 2   ?Change in appetite 1 0 1 1 1   ?Feeling bad or failure about yourself  0 0 1 1 2   ?Trouble concentrating 2 0 0 1 0  ?Moving slowly or fidgety/restless 1 0 1 2 1   ?Suicidal thoughts 0 0 0 0 0  ?PHQ-9 Score 13 0 9 10 9   ?Difficult doing work/chores Very difficult Not difficult at all   Somewhat difficult  ? ? ? ?History ?Patient Active Problem List  ? Diagnosis Date  Noted  ? Shortness of breath at rest 05/09/2021  ? Loud snoring 03/24/2021  ? Excessive daytime sleepiness 03/24/2021  ? Acute on chronic combined systolic and diastolic HF (heart failure) ([place]) 03/24/2021  ? Panlobular emphysema ([place]) 03/24/2021  ? GAD (generalized anxiety disorder) 03/24/2021  ? Atypical chest pain 01/22/2021  ? Respiratory distress 01/20/2021  ? Acute on chronic combined systolic and diastolic CHF (congestive heart failure) ([place]) 01/20/2021  ? Type 2 diabetes mellitus without complication, without long-term current use of insulin ([place]) 10/26/2017  ? Generalized anxiety disorder 01/10/2014  ? Vitamin D deficiency 01/10/2014  ? Essential hypertension, benign 01/10/2014  ? Pure hypercholesterolemia 01/10/2014  ? Hypothyroidism 01/10/2014  ? ?Past Medical History:  ?Diagnosis Date  ? Allergy   ? Anxiety   ? CHF (congestive heart failure) ([place]) 01/20/2021  ? COPD (chronic obstructive pulmonary disease) ([place])   ? Coronary artery calcification   ? Depression   ? Diabetes mellitus without complication ([place])   ? Diverticulitis   ? with colon resection  ? GERD (gastroesophageal reflux disease)   ? on pepcid  ? Hyperlipidemia   ? Hypertension   ? Sleep  apnea   ? cpap on order 01-01-22  ? Sleep paralysis   ? will be tested for narcolepsy per pt 01-01-22  ? Thyroid disease   ? ?Past Surgical History:  ?Procedure Laterality Date  ? BREAST EXCISIONAL BIOPSY Left   ? CESAREAN SECTION    ? x2  ? CHOLECYSTECTOMY    ? COLON SURGERY    ? diverticulitis with perforation; s/p colon resection.  ? Colonoscopy    ? COLONOSCOPY    ? DENTAL SURGERY    ? RIGHT/LEFT HEART CATH AND CORONARY ANGIOGRAPHY N/A 01/22/2021  ? Procedure: RIGHT/LEFT HEART CATH AND CORONARY ANGIOGRAPHY;  Surgeon: Adrian Prows, MD;  Location: [facility];  Service: Cardiovascular;  Laterality: N/A;  ? TUBAL LIGATION    ? ?Allergies  ?Allergen Reactions  ? Metformin And Related Other (See Comments)  ?  Could not swallow  ? Nicotine Rash  ?  Only  allergic to nicotine patch  ? Ace Inhibitors Cough  ?  Cough with lisinopril.   ? ?Prior to Admission medications   ?Medication Sig Start Date End Date Taking? Authorizing Provider  ?ACCU-CHEK AVIVA PLUS test strip daily. for testing 03/11/18  Yes [provider]  ?albuterol (VENTOLIN HFA) 108 (90 Base) MCG/ACT inhaler Inhale 1 puff into the lungs every 6 (six) hours as needed for wheezing or shortness of breath.   Yes [provider]  ?ALPRAZolam Duanne Moron) 1 MG tablet Take 0.5 mg by mouth 3 (three) times daily as needed for anxiety or sleep.  08/21/13  Yes [provider]  ?aspirin 81 MG tablet Take 81 mg by mouth daily.   Yes [provider]  ?BLACK ELDERBERRY PO Take 10 mLs by mouth daily.   Yes [provider]  ?cetirizine (ZYRTEC) 10 MG tablet Take 10 mg by mouth daily.   Yes [provider]  ?Continuous Blood Gluc Receiver (DEXCOM G6 RECEIVER) DEVI 1 Device by Does not apply route every morning. Device and supplies 04/18/20  Yes Wendie Agreste, MD  ?dapagliflozin propanediol (FARXIGA) 10 MG TABS tablet Take 1 tablet (10 mg total) by mouth daily. 01/25/21  Yes Regalado, Belkys A, MD  ?Dulaglutide 1.5 MG/0.5ML SOPN Inject 1.5 mg into the skin once a week. 09/11/21  Yes Maximiano Coss, NP  ?DULoxetine (CYMBALTA) 60 MG capsule TAKE 2 CAPSULES BY MOUTH DAILY 06/06/21  Yes Wendie Agreste, MD  ?ezetimibe (ZETIA) 10 MG tablet TAKE 1 TABLET BY MOUTH EVERY DAY 01/07/22  Yes Wendie Agreste, MD  ?famotidine (PEPCID) 20 MG tablet Take 20 mg by mouth daily as needed for heartburn or indigestion.   Yes [provider]  ?fluticasone (FLONASE) 50 MCG/ACT nasal spray Place 2 sprays into both nostrils daily. 03/15/22  Yes Leath-Warren, Alda Lea, NP  ?insulin glargine (LANTUS SOLOSTAR) 100 UNIT/ML Solostar Pen Inject 52 Units into the skin daily. ?Patient taking differently: Inject 54 Units into the skin at bedtime. 09/11/21  Yes Maximiano Coss, NP  ?Insulin Pen  Needle 31G X 5 MM MISC Inject 1 each into the skin daily. 02/10/21  Yes Wendie Agreste, MD  ?levothyroxine (SYNTHROID) 88 MCG tablet TAKE 1 TABLET BY MOUTH EVERY DAY BEFORE BREAKFAST 01/07/22  Yes Wendie Agreste, MD  ?magnesium oxide (MAG-OX) 400 (240 Mg) MG tablet TAKE 0.5 TABLETS (200 MG TOTAL) BY MOUTH 2 TIMES DAILY. 01/06/22  Yes Tolia, Sunit, DO  ?metoprolol succinate (TOPROL-XL) 100 MG 24 hr tablet TAKE 1 TABLET BY MOUTH DAILY. TAKE WITH OR IMMEDIATELY FOLLOWING  A MEAL. 12/29/21  Yes Tolia, Sunit, DO  ?nitroGLYCERIN (NITROSTAT) 0.4 MG SL tablet PLACE 1 TABLET UNDER THE TONGUE EVERY 5 MINUTES AS NEEDED FOR CHEST PAIN. IF YOU REQUIRE MORE THAN TWO TABLETS FIVE MINUTES APART GO TO THE NEAREST ER VIA EMS. ?Patient taking differently: Place 0.4 mg under the tongue every 5 (five) minutes as needed for chest pain. 07/29/20  Yes Tolia, Sunit, DO  ?ondansetron (ZOFRAN-ODT) 4 MG disintegrating tablet Take 1 tablet (4 mg total) by mouth every 8 (eight) hours as needed. 02/01/22  Yes Francene Finders, PA-C  ?pravastatin (PRAVACHOL) 80 MG tablet TAKE 1 TABLET BY MOUTH EVERY EVENING 11/29/21  Yes Wendie Agreste, MD  ?promethazine-dextromethorphan (PROMETHAZINE-DM) 6.25-15 MG/5ML syrup Take 5 mLs by mouth 4 (four) times daily as needed for cough. 03/15/22  Yes Leath-Warren, Alda Lea, NP  ?sacubitril-valsartan (ENTRESTO) 97-103 MG Take 1 tablet by mouth 2 (two) times daily. 02/05/22  Yes Tolia, Sunit, DO  ?spironolactone (ALDACTONE) 25 MG tablet TAKE 1 TABLET BY MOUTH EVERY DAY 12/12/21  Yes Wendie Agreste, MD  ?Vitamin D, Ergocalciferol, (DRISDOL) 1.25 MG (50000 UNIT) CAPS capsule TAKE 1 CAPSULE (50,000 UNITS TOTAL) BY MOUTH EVERY 7 (SEVEN) DAYS 08/13/21  Yes Wendie Agreste, MD  ? ?Social History  ? ?Socioeconomic History  ? Marital status: Married  ?  Spouse name: Not on file  ? Number of children: 2  ? Years of education: some college  ? Highest education level: Not on file  ?Occupational History  ? Occupation: Retired   ?Tobacco Use  ? Smoking status: Every Day  ?  Packs/day: 0.25  ?  Years: 38.00  ?  Pack years: 9.50  ?  Types: Cigarettes  ? Smokeless tobacco: Never  ? Tobacco comments:  ?  2-5 A DAY  ?Vaping Use  ? Vaping Use:

## 2022-03-23 ENCOUNTER — Other Ambulatory Visit: Payer: Self-pay | Admitting: Family Medicine

## 2022-03-23 DIAGNOSIS — R7989 Other specified abnormal findings of blood chemistry: Secondary | ICD-10-CM

## 2022-03-24 ENCOUNTER — Telehealth: Payer: Self-pay

## 2022-03-24 NOTE — Telephone Encounter (Signed)
Pt states sxs have continued but no new or worsening  ?

## 2022-03-24 NOTE — Telephone Encounter (Signed)
Patient is calling in stating that she finished the [facility], and continuing the cough medication. Noticed a slight improvement and no fever but is still feeling bad and the cough has not fully improved. Asking for advice on what to do now.  ?

## 2022-03-25 NOTE — Telephone Encounter (Signed)
Pt is scheduled for 03/26/22  ?

## 2022-03-25 NOTE — Telephone Encounter (Signed)
Repeat office visit today if possible for repeat exam, possible further testing including evaluation for CHF. ?

## 2022-03-26 ENCOUNTER — Institutional Professional Consult (permissible substitution): Payer: Medicare Other | Admitting: Neurology

## 2022-03-26 ENCOUNTER — Encounter: Payer: Self-pay | Admitting: Family Medicine

## 2022-03-26 ENCOUNTER — Ambulatory Visit: Payer: Medicare Other | Admitting: Family Medicine

## 2022-03-26 ENCOUNTER — Other Ambulatory Visit: Payer: Self-pay

## 2022-03-26 ENCOUNTER — Ambulatory Visit (INDEPENDENT_AMBULATORY_CARE_PROVIDER_SITE_OTHER)
Admission: RE | Admit: 2022-03-26 | Discharge: 2022-03-26 | Disposition: A | Discharge status: Home / Self Care | Payer: Medicare Other | Source: Ambulatory Visit | Attending: Family Medicine | Admitting: Family Medicine

## 2022-03-26 VITALS — BP 140/82 | HR 80 | Temp 97.6°F | Resp 17 | Ht 60.0 in | Wt 162.4 lb

## 2022-03-26 DIAGNOSIS — R059 Cough, unspecified: Secondary | ICD-10-CM | POA: Diagnosis not present

## 2022-03-26 DIAGNOSIS — R062 Wheezing: Secondary | ICD-10-CM | POA: Diagnosis not present

## 2022-03-26 DIAGNOSIS — R509 Fever, unspecified: Secondary | ICD-10-CM

## 2022-03-26 DIAGNOSIS — I5043 Acute on chronic combined systolic (congestive) and diastolic (congestive) heart failure: Secondary | ICD-10-CM

## 2022-03-26 DIAGNOSIS — J22 Unspecified acute lower respiratory infection: Secondary | ICD-10-CM

## 2022-03-26 LAB — POC INFLUENZA A&B (BINAX/QUICKVUE)
Influenza A, POC: NEGATIVE
Influenza B, POC: NEGATIVE

## 2022-03-26 LAB — POC COVID19 BINAXNOW: SARS Coronavirus 2 Ag: NEGATIVE

## 2022-03-26 MED ORDER — PREDNISONE 20 MG PO TABS
40.0000 mg | ORAL_TABLET | Freq: Every day | ORAL | 0 refills | Status: DC
Start: 2022-03-26 — End: 2023-05-11

## 2022-03-26 NOTE — Patient Instructions (Addendum)
COVID and flu test were normal today/negative.  Start albuterol as needed for wheezing up to every 4-6 hours but if you require that medication sooner, or any worsening symptoms be seen in the ER as we discussed. Repeat prednisone short course.  Depending on your x-ray results today and blood work can discuss next step.   ? ?[facility] ?Walk in 8:30-4:30 during weekdays, no appointment needed ?[facility]  ?[place], [place] [address] ? ?Return to the clinic or go to the nearest emergency room if any of your symptoms worsen or new symptoms occur. ? ? ? ?If you have lab work done today you will be contacted with your lab results within the next 2 weeks.  If you have not heard from [place] then please contact us. The fastest way to get your results is to register for My Chart. ? ? ?IF you received an x-ray today, you will receive an invoice from [facility]. Please contact [facility] at [phone] with questions or concerns regarding your invoice.  ? ?IF you received labwork today, you will receive an invoice from [facility]. Please contact [facility] at [phone] with questions or concerns regarding your invoice.  ? ?Our billing staff will not be able to assist you with questions regarding bills from these companies. ? ?You will be contacted with the lab results as soon as they are available. The fastest way to get your results is to activate your My Chart account. Instructions are located on the last page of this paperwork. If you have not heard from [place] regarding the results in 2 weeks, please contact this office. ?  ? ? ?

## 2022-03-26 NOTE — Progress Notes (Signed)
? ?Subjective:  ?Patient ID: Felicia Hanson, female    DOB: [birth year]  Age: 66 y.o. MRN: [id number] ? ?CC:  ?Chief Complaint  ?Patient presents with  ? Cough  ?  Patient states she feels like she has a URI patient has been experiencing a cough, sore throat , 102.6 fever for 3 days but not in the last few days, congestion. Tested for covid 3 weeks ago  ? ? ?HPI ?Felicia Hanson presents for  ? ?Follow-up for cough ?Last visit March 18, 2022.  Cough for 6 days at that time.  She had been seen in urgent care 3 days prior.  Diagnosed with viral illness, negative flu and COVID testing.  Prescribed Phenergan cough syrup.  Minimal improvement in cough at her last visit, with some wheezing for a few days at that time.  Expiratory wheeze noted with few scattered rhonchi inferiorly.  Started on azithromycin for possible lower respiratory tract infection/bronchitis/early pneumonia, prednisone for reactive airway, Mucinex if needed for cough and Phenergan at bedtime.  Chest x-ray 4/19 with mild interstitial prominence, could represent sequela of recurrent bouts of CHF versus mild interstitial edema but no confluent consolidation. ? ?Since last visit: ?Cough has improved some with zpak, and prednisone.still wheezing. . Still some chest congestion, still trouble sleeping - unknown cause - ? Anxious/stressed - treated by psychiatrist - she has called to discuss.  ?Grandaughter has been sick recently with fever, URI, ear infection. No exposure to covid. ?She had fever 101-102.6 for 3 days since last visit, last fever few days ago. No recent home covid test.  ?No new leg swelling.  ?Has albuterol - not using. Has another inhaler -  1-2 puffs flovent used nightly.  ?Glucose 109-118  ?Some irritability with prednisone, down feeling, no SI, or hallucinations.  ? ?History ?Patient Active Problem List  ? Diagnosis Date Noted  ? Shortness of breath at rest 05/09/2021  ? Loud snoring 03/24/2021  ? Excessive daytime sleepiness 03/24/2021  ?  Acute on chronic combined systolic and diastolic HF (heart failure) (HCC) 03/24/2021  ? Panlobular emphysema (HCC) 03/24/2021  ? GAD (generalized anxiety disorder) 03/24/2021  ? Atypical chest pain 01/22/2021  ? Respiratory distress 01/20/2021  ? Acute on chronic combined systolic and diastolic CHF (congestive heart failure) (HCC) 01/20/2021  ? Type 2 diabetes mellitus without complication, without long-term current use of insulin (HCC) 10/26/2017  ? Generalized anxiety disorder 01/10/2014  ? Vitamin D deficiency 01/10/2014  ? Essential hypertension, benign 01/10/2014  ? Pure hypercholesterolemia 01/10/2014  ? Hypothyroidism 01/10/2014  ? ?Past Medical History:  ?Diagnosis Date  ? Allergy   ? Anxiety   ? CHF (congestive heart failure) (HCC) 01/20/2021  ? COPD (chronic obstructive pulmonary disease) (HCC)   ? Coronary artery calcification   ? Depression   ? Diabetes mellitus without complication (HCC)   ? Diverticulitis   ? with colon resection  ? GERD (gastroesophageal reflux disease)   ? on pepcid  ? Hyperlipidemia   ? Hypertension   ? Sleep apnea   ? cpap on order 01-01-22  ? Sleep paralysis   ? will be tested for narcolepsy per pt 01-01-22  ? Thyroid disease   ? ?Past Surgical History:  ?Procedure Laterality Date  ? BREAST EXCISIONAL BIOPSY Left   ? CESAREAN SECTION    ? x2  ? CHOLECYSTECTOMY    ? COLON SURGERY    ? diverticulitis with perforation; s/p colon resection.  ? Colonoscopy    ? COLONOSCOPY    ?  DENTAL SURGERY    ? RIGHT/LEFT HEART CATH AND CORONARY ANGIOGRAPHY N/A 01/22/2021  ? Procedure: RIGHT/LEFT HEART CATH AND CORONARY ANGIOGRAPHY;  Surgeon: Yates Decamp, MD;  Location: [facility];  Service: Cardiovascular;  Laterality: N/A;  ? TUBAL LIGATION    ? ?Allergies  ?Allergen Reactions  ? Metformin And Related Other (See Comments)  ?  Could not swallow  ? Nicotine Rash  ?  Only allergic to nicotine patch  ? Ace Inhibitors Cough  ?  Cough with lisinopril.   ? ?Prior to Admission medications   ?Medication  Sig Start Date End Date Taking? Authorizing Provider  ?ACCU-CHEK AVIVA PLUS test strip daily. for testing 03/11/18  Yes [provider]  ?albuterol (VENTOLIN HFA) 108 (90 Base) MCG/ACT inhaler Inhale 1 puff into the lungs every 6 (six) hours as needed for wheezing or shortness of breath.   Yes [provider]  ?ALPRAZolam Prudy Feeler) 1 MG tablet Take 0.5 mg by mouth 3 (three) times daily as needed for anxiety or sleep.  08/21/13  Yes [provider]  ?aspirin 81 MG tablet Take 81 mg by mouth daily.   Yes [provider]  ?BLACK ELDERBERRY PO Take 10 mLs by mouth daily.   Yes [provider]  ?cetirizine (ZYRTEC) 10 MG tablet Take 10 mg by mouth daily.   Yes [provider]  ?Continuous Blood Gluc Receiver (DEXCOM G6 RECEIVER) DEVI 1 Device by Does not apply route every morning. Device and supplies 04/18/20  Yes Shade Flood, MD  ?dapagliflozin propanediol (FARXIGA) 10 MG TABS tablet Take 1 tablet (10 mg total) by mouth daily. 01/25/21  Yes Regalado, Belkys A, MD  ?Dulaglutide 1.5 MG/0.5ML SOPN Inject 1.5 mg into the skin once a week. 09/11/21  Yes Janeece Agee, NP  ?DULoxetine (CYMBALTA) 60 MG capsule TAKE 2 CAPSULES BY MOUTH DAILY 06/06/21  Yes Shade Flood, MD  ?ezetimibe (ZETIA) 10 MG tablet TAKE 1 TABLET BY MOUTH EVERY DAY 01/07/22  Yes Shade Flood, MD  ?famotidine (PEPCID) 20 MG tablet Take 20 mg by mouth daily as needed for heartburn or indigestion.   Yes [provider]  ?fluticasone (FLONASE) 50 MCG/ACT nasal spray Place 2 sprays into both nostrils daily. 03/15/22  Yes Leath-Warren, Sadie Haber, NP  ?insulin glargine (LANTUS SOLOSTAR) 100 UNIT/ML Solostar Pen Inject 52 Units into the skin daily. ?Patient taking differently: Inject 54 Units into the skin at bedtime. 09/11/21  Yes Janeece Agee, NP  ?Insulin Pen Needle 31G X 5 MM MISC Inject 1 each into the skin daily. 02/10/21  Yes Shade Flood, MD  ?levothyroxine (SYNTHROID) 88 MCG  tablet TAKE 1 TABLET BY MOUTH EVERY DAY BEFORE BREAKFAST 01/07/22  Yes Shade Flood, MD  ?magnesium oxide (MAG-OX) 400 (240 Mg) MG tablet TAKE 0.5 TABLETS (200 MG TOTAL) BY MOUTH 2 TIMES DAILY. 01/06/22  Yes Tolia, Sunit, DO  ?metoprolol succinate (TOPROL-XL) 100 MG 24 hr tablet TAKE 1 TABLET BY MOUTH DAILY. TAKE WITH OR IMMEDIATELY FOLLOWING A MEAL. 12/29/21  Yes Tolia, Sunit, DO  ?nitroGLYCERIN (NITROSTAT) 0.4 MG SL tablet PLACE 1 TABLET UNDER THE TONGUE EVERY 5 MINUTES AS NEEDED FOR CHEST PAIN. IF YOU REQUIRE MORE THAN TWO TABLETS FIVE MINUTES APART GO TO THE NEAREST ER VIA EMS. ?Patient taking differently: Place 0.4 mg under the tongue every 5 (five) minutes as needed for chest pain. 07/29/20  Yes Tolia, Sunit, DO  ?ondansetron (ZOFRAN-ODT) 4 MG disintegrating tablet Take 1 tablet (4 mg total) by  mouth every 8 (eight) hours as needed. 02/01/22  Yes Tomi Bamberger, PA-C  ?pravastatin (PRAVACHOL) 80 MG tablet TAKE 1 TABLET BY MOUTH EVERY EVENING 11/29/21  Yes Shade Flood, MD  ?predniSONE (DELTASONE) 20 MG tablet Take 2 tablets (40 mg total) by mouth daily with breakfast. 03/18/22  Yes Shade Flood, MD  ?promethazine-dextromethorphan (PROMETHAZINE-DM) 6.25-15 MG/5ML syrup Take 5 mLs by mouth 4 (four) times daily as needed for cough. 03/15/22  Yes Leath-Warren, Sadie Haber, NP  ?sacubitril-valsartan (ENTRESTO) 97-103 MG Take 1 tablet by mouth 2 (two) times daily. 02/05/22  Yes Tolia, Sunit, DO  ?spironolactone (ALDACTONE) 25 MG tablet TAKE 1 TABLET BY MOUTH EVERY DAY 03/24/22  Yes Shade Flood, MD  ?Vitamin D, Ergocalciferol, (DRISDOL) 1.25 MG (50000 UNIT) CAPS capsule TAKE 1 CAPSULE (50,000 UNITS TOTAL) BY MOUTH EVERY 7 (SEVEN) DAYS 03/24/22  Yes Shade Flood, MD  ? ?Social History  ? ?Socioeconomic History  ? Marital status: Married  ?  Spouse name: Not on file  ? Number of children: 2  ? Years of education: some college  ? Highest education level: Not on file  ?Occupational History  ? Occupation:  Retired  ?Tobacco Use  ? Smoking status: Every Day  ?  Packs/day: 0.25  ?  Years: 38.00  ?  Pack years: 9.50  ?  Types: Cigarettes  ? Smokeless tobacco: Never  ? Tobacco comments:  ?  2-5 A DAY  ?Vaping [place]

## 2022-03-31 ENCOUNTER — Telehealth: Payer: Self-pay

## 2022-03-31 NOTE — Telephone Encounter (Signed)
Patient called in stating that she was prescribed prednisone, the wheezing has gone away but still has a lingering cough/congestion. Wanting to know if she can get another refill or something else called in.  ?

## 2022-03-31 NOTE — Telephone Encounter (Signed)
Can pt have another rx to try and finish off wheezing or should she return for continued sxs? ?Per notes states return if sxs worsen or new sxs occur  ?

## 2022-03-31 NOTE — Telephone Encounter (Signed)
If wheezing has resolved then potentially should not need more prednisone, especially with side effects she was experienced previously.  Cough may be last symptom to resolve so if she is improving I would give it a little bit more time.  If she feels like she has plateaued or any worsening, should be seen in the next few days.  Thanks. ?

## 2022-04-01 NOTE — Telephone Encounter (Signed)
Called LM asking that she return call so we may discuss recommendation  ?

## 2022-04-01 NOTE — Telephone Encounter (Signed)
Pt called back and was informed, voiced understanding  ?

## 2022-04-03 ENCOUNTER — Telehealth: Payer: Self-pay | Admitting: Family Medicine

## 2022-04-03 ENCOUNTER — Other Ambulatory Visit: Payer: Self-pay | Admitting: Registered Nurse

## 2022-04-03 ENCOUNTER — Other Ambulatory Visit: Payer: Self-pay | Admitting: Family Medicine

## 2022-04-03 DIAGNOSIS — E1165 Type 2 diabetes mellitus with hyperglycemia: Secondary | ICD-10-CM

## 2022-04-03 NOTE — Telephone Encounter (Signed)
Pt called in stating that she feels like this sinus infection/cold and cough is starting all over again. She states that she is up all night coughing.  ? ?Pt states that she didn't know what else to do, she can't come in due to just not feeling well enough to get here.  ? ?She wanted to know to the results of her xray. ? ?Please advise pt can be at [phone] or [phone] ?

## 2022-04-03 NOTE — Telephone Encounter (Signed)
Last chest x-ray on April 27 - both lungs were clear, no evidence of pneumonia. ? ?Called pt to discuss symptoms, plan. No answer. Left vm to call back: ? ?If she is wheezing or needing albuterol we may need to use a slightly longer course of prednisone. I know she has had some side effects with that previously but may need to repeat if continued reactive airway or wheezing.  Is she using flovent inhaler? Can increase that to 2-4 puffs twice per day to help with reactive airway/wheeze.  ? ?What is she using for cough?  We have prescribed Phenergan cough syrup previously, has she been using that recently? ? ?If she is feeling worse, or new symptoms she needs to be seen. Urgent care or ER if unable to be seen here.  ? ?

## 2022-04-06 NOTE — Telephone Encounter (Signed)
Has anyone reached out to the pt about this?  ?

## 2022-04-06 NOTE — Telephone Encounter (Signed)
Will defer to you on this -  Thanks,  Rich

## 2022-04-07 NOTE — Telephone Encounter (Signed)
Glad to hear that the cough from smoke irritation has improved but if she is still having persistent productive cough, it may be best that she is seen again in the office in the next day or two to determine if repeat antibiotics indicated versus prednisone again versus change in meds for cough.  Thanks.  ?
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
## 2022-04-08 NOTE — Telephone Encounter (Signed)
Pt has been improving will call back if sxs continue  ?

## 2022-04-15 NOTE — Telephone Encounter (Signed)
Received a notification from [facility] that the patient never called to start machine and was a no show to last appointment. ? ? ? ?

## 2022-04-29 ENCOUNTER — Encounter: Payer: Self-pay | Admitting: Family Medicine

## 2022-04-29 ENCOUNTER — Ambulatory Visit (INDEPENDENT_AMBULATORY_CARE_PROVIDER_SITE_OTHER): Payer: Medicare Other | Admitting: Family Medicine

## 2022-04-29 VITALS — BP 130/74 | HR 56 | Temp 98.1°F | Resp 15 | Ht 60.0 in | Wt 159.0 lb

## 2022-04-29 DIAGNOSIS — R062 Wheezing: Secondary | ICD-10-CM | POA: Diagnosis not present

## 2022-04-29 DIAGNOSIS — R0982 Postnasal drip: Secondary | ICD-10-CM

## 2022-04-29 DIAGNOSIS — K219 Gastro-esophageal reflux disease without esophagitis: Secondary | ICD-10-CM

## 2022-04-29 DIAGNOSIS — R052 Subacute cough: Secondary | ICD-10-CM

## 2022-04-29 MED ORDER — OMEPRAZOLE 20 MG PO CPDR: 20.0000 mg | DELAYED_RELEASE_CAPSULE | Freq: Every day | ORAL | 3 refills | Status: DC

## 2022-04-29 NOTE — Progress Notes (Signed)
Subjective:  Patient ID: Felicia Hanson, female    DOB: 1956/02/19  Age: 66 y.o. MRN: [id number]  CC:  Chief Complaint  Patient presents with   Cough    Pt here for recheck on cough reports continued cough and congestion reports worse at night,     HPI [name] presents for   Cough: Recurrent issue. Discussed April 19, 6 days of headache, sore throat, cough and congestion at that time.  ER visit 3 days prior, treated with fluticasone nasal spray and Phenergan DM cough syrup.  Negative COVID testing.  Possible viral illness with reactive airway/wheezing.  Treated with prednisone, azithromycin.  Chest x-ray with mild interstitial prominence which could represent sequela of recurrent bouts of CHF versus mild interstitial edema, no confluent consolidation. Repeat office visit April 27, slight improvement but still wheezing, chest congestion.  Using Flovent at that time but not albuterol.  Possible secondary viral syndrome with repeat sick contact.  Repeat prednisone shorter course of 3 days as some mood symptoms with that medication.  Albuterol as needed.  Flu and COVID testing negative.  Chest x-ray 03/26/2022, lungs clear, no evidence of pneumonia.  CBC and BMP were ordered at that time but not completed. Call May 2 with persistent cough, RTC precautions were discussed.  Phone note on May 9th -no longer wheezing but still some cough, did have some smoke exposure.  That cough was improving at that call but still had a productive cough.  Advised appointment within a few days to evaluate further. Cough never resolved, mostly at night. No recurrence of fever. Using safe tussin, flovent inhaler - 1-2 BID. Min improvement.  Heartburn a few times, but infrequent. Taking pepcid ac at night.  Cough is keeping her up at night. Dry cough typically. Feels like chest congestion, but unable to produce phlegm. Cough at night overall same.no shortness of breath. Occasional wheeze - not using albuterol.   Grandchildren sick off and on past few months.  Some runny nose, not much PND - maybe some at night.  Not using flonase ns - concerned about use.      History Patient Active Problem List   Diagnosis Date Noted   Shortness of breath at rest 05/09/2021   Loud snoring 03/24/2021   Excessive daytime sleepiness 03/24/2021   Acute on chronic combined systolic and diastolic HF (heart failure) ([place]) 03/24/2021   Panlobular emphysema ([place]) 03/24/2021   GAD (generalized anxiety disorder) 03/24/2021   Atypical chest pain 01/22/2021   Respiratory distress 01/20/2021   Acute on chronic combined systolic and diastolic CHF (congestive heart failure) ([place]) 01/20/2021   Type 2 diabetes mellitus without complication, without long-term current use of insulin ([place]) 10/26/2017   Generalized anxiety disorder 01/10/2014   Vitamin D deficiency 01/10/2014   Essential hypertension, benign 01/10/2014   Pure hypercholesterolemia 01/10/2014   Hypothyroidism 01/10/2014   Past Medical History:  Diagnosis Date   Allergy    Anxiety    CHF (congestive heart failure) ([place]) 01/20/2021   COPD (chronic obstructive pulmonary disease) ([place])    Coronary artery calcification    Depression    Diabetes mellitus without complication ([place])    Diverticulitis    with colon resection   GERD (gastroesophageal reflux disease)    on pepcid   Hyperlipidemia    Hypertension    Sleep apnea    cpap on order 01-01-22   Sleep paralysis    will be tested for narcolepsy per pt 01-01-22   Thyroid disease  Past Surgical History:  Procedure Laterality Date   BREAST EXCISIONAL BIOPSY Left    CESAREAN SECTION     x2   CHOLECYSTECTOMY     COLON SURGERY     diverticulitis with perforation; s/p colon resection.   Colonoscopy     COLONOSCOPY     DENTAL SURGERY     RIGHT/LEFT HEART CATH AND CORONARY ANGIOGRAPHY N/A 01/22/2021   Procedure: RIGHT/LEFT HEART CATH AND CORONARY ANGIOGRAPHY;  Surgeon: Adrian Prows, MD;  Location: [facility];  Service: Cardiovascular;  Laterality: N/A;   TUBAL LIGATION     Allergies  Allergen Reactions   Metformin And Related Other (See Comments)    Could not swallow   Nicotine Rash    Only allergic to nicotine patch   Ace Inhibitors Cough    Cough with lisinopril.    Prior to Admission medications   Medication Sig Start Date End Date Taking? Authorizing Provider  ACCU-CHEK AVIVA PLUS test strip daily. for testing 03/11/18  Yes [provider]  albuterol (VENTOLIN HFA) 108 (90 Base) MCG/ACT inhaler Inhale 1 puff into the lungs every 6 (six) hours as needed for wheezing or shortness of breath.   Yes [provider]  ALPRAZolam Duanne Moron) 1 MG tablet Take 0.5 mg by mouth 3 (three) times daily as needed for anxiety or sleep.  08/21/13  Yes [provider]  aspirin 81 MG tablet Take 81 mg by mouth daily.   Yes [provider]  BLACK ELDERBERRY PO Take 10 mLs by mouth daily.   Yes [provider]  cetirizine (ZYRTEC) 10 MG tablet Take 10 mg by mouth daily.   Yes [provider]  Continuous Blood Gluc Receiver (DEXCOM G6 RECEIVER) DEVI 1 Device by Does not apply route every morning. Device and supplies 04/18/20  Yes Wendie Agreste, MD  dapagliflozin propanediol (FARXIGA) 10 MG TABS tablet Take 1 tablet (10 mg total) by mouth daily. 01/25/21  Yes Regalado, Belkys A, MD  Dulaglutide 1.5 MG/0.5ML SOPN Inject 1.5 mg into the skin once a week. 09/11/21  Yes Maximiano Coss, NP  DULoxetine (CYMBALTA) 60 MG capsule TAKE 2 CAPSULES BY MOUTH DAILY 06/06/21  Yes Wendie Agreste, MD  ezetimibe (ZETIA) 10 MG tablet TAKE 1 TABLET BY MOUTH EVERY DAY 04/06/22  Yes Wendie Agreste, MD  famotidine (PEPCID) 20 MG tablet Take 20 mg by mouth daily as needed for heartburn or indigestion.   Yes [provider]  fluticasone (FLONASE) 50 MCG/ACT nasal spray Place 2 sprays into both nostrils daily. 03/15/22  Yes Leath-Warren, Alda Lea, NP  insulin  glargine (LANTUS SOLOSTAR) 100 UNIT/ML Solostar Pen Inject 54 Units into the skin at bedtime. 04/06/22  Yes Wendie Agreste, MD  Insulin Pen Needle 31G X 5 MM MISC Inject 1 each into the skin daily. 02/10/21  Yes Wendie Agreste, MD  levothyroxine (SYNTHROID) 88 MCG tablet TAKE 1 TABLET BY MOUTH EVERY DAY BEFORE BREAKFAST 01/07/22  Yes Wendie Agreste, MD  magnesium oxide (MAG-OX) 400 (240 Mg) MG tablet TAKE 0.5 TABLETS (200 MG TOTAL) BY MOUTH 2 TIMES DAILY. 01/06/22  Yes Tolia, Sunit, DO  metoprolol succinate (TOPROL-XL) 100 MG 24 hr tablet TAKE 1 TABLET BY MOUTH DAILY. TAKE WITH OR IMMEDIATELY FOLLOWING A MEAL. 12/29/21  Yes Tolia, Sunit, DO  nitroGLYCERIN (NITROSTAT) 0.4 MG SL tablet PLACE 1 TABLET UNDER THE TONGUE EVERY 5 MINUTES AS NEEDED FOR CHEST PAIN. IF YOU REQUIRE MORE THAN TWO TABLETS FIVE MINUTES APART  GO TO THE NEAREST ER VIA EMS. Patient taking differently: Place 0.4 mg under the tongue every 5 (five) minutes as needed for chest pain. 07/29/20  Yes Tolia, Sunit, DO  ondansetron (ZOFRAN-ODT) 4 MG disintegrating tablet Take 1 tablet (4 mg total) by mouth every 8 (eight) hours as needed. 02/01/22  Yes Francene Finders, PA-C  pravastatin (PRAVACHOL) 80 MG tablet TAKE 1 TABLET BY MOUTH EVERY EVENING 11/29/21  Yes Wendie Agreste, MD  predniSONE (DELTASONE) 20 MG tablet Take 2 tablets (40 mg total) by mouth daily with breakfast. 03/26/22  Yes Wendie Agreste, MD  promethazine-dextromethorphan (PROMETHAZINE-DM) 6.25-15 MG/5ML syrup Take 5 mLs by mouth 4 (four) times daily as needed for cough. 03/15/22  Yes Leath-Warren, Alda Lea, NP  sacubitril-valsartan (ENTRESTO) 97-103 MG Take 1 tablet by mouth 2 (two) times daily. 02/05/22  Yes Tolia, Sunit, DO  spironolactone (ALDACTONE) 25 MG tablet TAKE 1 TABLET BY MOUTH EVERY DAY 03/24/22  Yes Wendie Agreste, MD  Vitamin D, Ergocalciferol, (DRISDOL) 1.25 MG (50000 UNIT) CAPS capsule TAKE 1 CAPSULE (50,000 UNITS TOTAL) BY MOUTH EVERY 7 (SEVEN) DAYS 03/24/22   Yes Wendie Agreste, MD   Social History   Socioeconomic History   Marital status: Married    Spouse name: Not on file   Number of children: 2   Years of education: some college   Highest education level: Not on file  Occupational History   Occupation: Retired  Tobacco Use   Smoking status: Every Day    Packs/day: 0.25    Years: 38.00    Pack years: 9.50    Types: Cigarettes   Smokeless tobacco: Never   Tobacco comments:    2-5 A DAY  Vaping Use   Vaping Use: Never used  Substance and Sexual Activity   Alcohol use: No    Alcohol/week: 0.0 standard drinks   Drug use: No   Sexual activity: Not Currently  Other Topics Concern   Not on file  Social History Narrative   Marital status: married      Children:  2 children; 4 grandchildren      Lives: with husband, 2 granddaughters      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   Left-handed.   Caffeine use: 2.5 cups caffeine per day.   Social Determinants of Health   Financial Resource Strain: Not on file  Food Insecurity: Not on file  Transportation Needs: Not on file  Physical Activity: Not on file  Stress: Not on file  Social Connections: Not on file  Intimate Partner Violence: Not on file    Review of Systems  Per HPI.  Objective:   Vitals:   04/29/22 1151  BP: 130/74  Pulse: (!) 56  Resp: 15  Temp: 98.1 F (36.7 C)  TempSrc: Oral  SpO2: 96%  Weight: 159 lb (72.1 kg)  Height: 5' (1.524 m)     Physical Exam Vitals reviewed.  Constitutional:      Appearance: Normal appearance. She is well-developed.  HENT:     Head: Normocephalic and atraumatic.  Eyes:     Conjunctiva/sclera: Conjunctivae normal.     Pupils: Pupils are equal, round, and reactive to light.  Neck:     Vascular: No carotid bruit.     Comments: No JVD Cardiovascular:     Rate and Rhythm: Normal rate and regular rhythm.  Heart sounds: Normal heart sounds.   Pulmonary:     Effort: Pulmonary effort is normal.     Comments: Few coarse breath sounds with forced expiration on the right but no rhonchi or wheeze at rest or normal respiration.  Normal effort, no distress, speaking full sentences. Abdominal:     Palpations: Abdomen is soft. There is no pulsatile mass.     Tenderness: There is no abdominal tenderness.  Musculoskeletal:     Right lower leg: No edema.     Left lower leg: No edema.  Skin:    General: Skin is warm and dry.  Neurological:     Mental Status: She is alert and oriented to person, place, and time.  Psychiatric:        Mood and Affect: Mood normal.        Behavior: Behavior normal.       Assessment & Plan:  AVREET CUZZORT is a 66 y.o. female . Subacute cough  PND (post-nasal drip)  Gastroesophageal reflux disease, unspecified whether esophagitis present  Wheezing  Still possible multifactorial cough including upper airway cough syndrome in addition to prior viral illness plus allergies with postnasal drip, heartburn with upper airway irritation  -Continue Flovent inhaler, albuterol if needed for breakthrough wheeze  -Add omeprazole 20 mg daily, continue Pepcid AC  -Start Flonase nasal spray, appropriate technique discussed  -repeat CXR, and have BNP, CBC ordered prior performed.   - update by call/mychart in next week, in person eval in 2 weeks. Consider laba/ics at that time or pulm eval if persistent.  Rtc/er precautions.   No orders of the defined types were placed in this encounter.  Patient Instructions  Use flonase with technique we discussed.  Start omeprazole once per day for possible heartburn cause. Continue pepcid AC Continue fluticasone inhaler 2 times per day. Albuterol if needed for wheezing  Have xray and labs at [facility] location.  Give me an update early next week. Next step would be pulmonary referral, or change asthma meds.  Return to the clinic or go to the nearest emergency room if  any of your symptoms worsen or new symptoms occur.  [facility] in 8:30-4:30 during weekdays, no appointment needed [place].  [place], [place] [address]      Signed,   Merri Ray, MD [facility] 04/29/22 12:47 PM

## 2022-04-29 NOTE — Patient Instructions (Addendum)
Use flonase with technique we discussed.  Start omeprazole once per day for possible heartburn cause. Continue pepcid AC Continue fluticasone inhaler 2 times per day. Albuterol if needed for wheezing  Have xray and labs at [facility] location.  Give me an update early next week. Next step would be pulmonary referral, or change asthma meds.  Return to the clinic or go to the nearest emergency room if any of your symptoms worsen or new symptoms occur.  [facility] in 8:30-4:30 during weekdays, no appointment needed [address]  [place], [place] [address]

## 2022-04-30 ENCOUNTER — Other Ambulatory Visit: Payer: Self-pay

## 2022-04-30 MED ORDER — DAPAGLIFLOZIN PROPANEDIOL 10 MG PO TABS: 10.0000 mg | ORAL_TABLET | Freq: Every day | ORAL | 3 refills | Status: DC

## 2022-05-01 ENCOUNTER — Ambulatory Visit (INDEPENDENT_AMBULATORY_CARE_PROVIDER_SITE_OTHER)
Admission: RE | Admit: 2022-05-01 | Discharge: 2022-05-01 | Disposition: A | Discharge status: Home / Self Care | Payer: Medicare Other | Source: Ambulatory Visit | Attending: Family Medicine | Admitting: Family Medicine

## 2022-05-01 ENCOUNTER — Other Ambulatory Visit (INDEPENDENT_AMBULATORY_CARE_PROVIDER_SITE_OTHER): Payer: Medicare Other

## 2022-05-01 DIAGNOSIS — E875 Hyperkalemia: Secondary | ICD-10-CM

## 2022-05-01 DIAGNOSIS — R062 Wheezing: Secondary | ICD-10-CM | POA: Diagnosis not present

## 2022-05-01 DIAGNOSIS — R052 Subacute cough: Secondary | ICD-10-CM

## 2022-05-01 DIAGNOSIS — I5043 Acute on chronic combined systolic (congestive) and diastolic (congestive) heart failure: Secondary | ICD-10-CM

## 2022-05-01 DIAGNOSIS — R509 Fever, unspecified: Secondary | ICD-10-CM

## 2022-05-01 DIAGNOSIS — R059 Cough, unspecified: Secondary | ICD-10-CM

## 2022-05-01 LAB — BASIC METABOLIC PANEL
BUN: 14 mg/dL (ref 6–23)
CO2: 29 mEq/L (ref 19–32)
Calcium: 9.9 mg/dL (ref 8.4–10.5)
Chloride: 102 mEq/L (ref 96–112)
Creatinine, Ser: 1.41 mg/dL — ABNORMAL HIGH (ref 0.40–1.20)
GFR: 38.63 mL/min — ABNORMAL LOW (ref >60.00)
Glucose, Bld: 143 mg/dL — ABNORMAL HIGH (ref 70–99)
Potassium: 4.4 mEq/L (ref 3.5–5.1)
Sodium: 138 mEq/L (ref 135–145)

## 2022-05-01 LAB — CBC
HCT: 45.7 % (ref 36.0–46.0)
Hemoglobin: 15 g/dL (ref 12.0–15.0)
MCHC: 32.9 g/dL (ref 30.0–36.0)
MCV: 87.4 fl (ref 78.0–100.0)
Platelets: 265 10*3/uL (ref 150.0–400.0)
RBC: 5.23 Mil/uL — ABNORMAL HIGH (ref 3.87–5.11)
RDW: 13.8 % (ref 11.5–15.5)
WBC: 11.1 10*3/uL — ABNORMAL HIGH (ref 4.0–10.5)

## 2022-05-02 LAB — PRO B NATRIURETIC PEPTIDE: NT-Pro BNP: 107 pg/mL (ref 0–301)

## 2022-05-05 ENCOUNTER — Telehealth: Payer: Self-pay

## 2022-05-05 NOTE — Telephone Encounter (Signed)
-----   Message from Shade Flood, MD sent at 05/05/2022  2:20 PM EDT ----- Call patient.  Check status.  X-ray was clear, no signs of pneumonia or fluid buildup.  Heart failure screening test also looked okay.  Infection fighting cells were borderline elevated.  Blood sugar slightly elevated, kidney function test slightly elevated but minimal change compared to March of last year.  Let me know how she is doing - will help decide next step or possible pulmonary referral.

## 2022-05-06 NOTE — Telephone Encounter (Signed)
  Left pt a Vm explaining that if she is still requiring the Safe Tussin beyond the next week to 10 days Dr Neva Seat recommended the pulmonary referral.

## 2022-05-06 NOTE — Telephone Encounter (Signed)
Safe tussin okay for now but if she does continue to require that beyond the next week to 10 days would recommend pulmonary eval.

## 2022-05-14 ENCOUNTER — Encounter: Payer: Self-pay | Admitting: Family Medicine

## 2022-05-14 ENCOUNTER — Ambulatory Visit (INDEPENDENT_AMBULATORY_CARE_PROVIDER_SITE_OTHER): Payer: Medicare Other | Admitting: Family Medicine

## 2022-05-14 VITALS — BP 126/70 | HR 82 | Temp 97.8°F | Resp 15 | Ht 60.0 in | Wt 162.0 lb

## 2022-05-14 DIAGNOSIS — J3489 Other specified disorders of nose and nasal sinuses: Secondary | ICD-10-CM | POA: Diagnosis not present

## 2022-05-14 DIAGNOSIS — R052 Subacute cough: Secondary | ICD-10-CM

## 2022-05-14 DIAGNOSIS — F418 Other specified anxiety disorders: Secondary | ICD-10-CM | POA: Diagnosis not present

## 2022-05-14 DIAGNOSIS — R7989 Other specified abnormal findings of blood chemistry: Secondary | ICD-10-CM | POA: Diagnosis not present

## 2022-05-14 DIAGNOSIS — D72829 Elevated white blood cell count, unspecified: Secondary | ICD-10-CM

## 2022-05-14 MED ORDER — AZELASTINE HCL 0.1 % NA SOLN: 1.0000 | Freq: Two times a day (BID) | NASAL | 1 refills | Status: DC

## 2022-05-14 MED ORDER — FLUTICASONE PROPIONATE HFA 44 MCG/ACT IN AERO: 1.0000 | INHALATION_SPRAY | Freq: Two times a day (BID) | RESPIRATORY_TRACT | 12 refills | Status: DC

## 2022-05-14 MED ORDER — DULOXETINE HCL 60 MG PO CPEP: 120.0000 mg | ORAL_CAPSULE | Freq: Every day | ORAL | 1 refills | Status: DC

## 2022-05-14 NOTE — Patient Instructions (Addendum)
Try increasing flovent to twice per day, and if continued feeling of wheeze at night, increase from 1 to 2 puffs at a time.  Astelin nasal spray only if more runny nose.  Continue other meds including omeprazole, flonase and pepcid.  Please call your psychiatrist if any refills needed on your psychiatric medicines.  I will refer you to pulmonary to discuss cough.  Return to the clinic or go to the nearest emergency room if any of your symptoms worsen or new symptoms occur.

## 2022-05-14 NOTE — Progress Notes (Signed)
Subjective:  Patient ID: Felicia Hanson, female    DOB: 01-27-1956  Age: 66 y.o. MRN: [id number]  CC:  Chief Complaint  Patient presents with   Cough    Pt here for 2 week f/u stopped tussin medication unless really needed, notes has been doing somewhat better, notes productive cough very mucous still but overall better    Depression    Pt is overdue for refill cymbalta PHQ 9=15    HPI Felicia Hanson presents for   Cough: Recurrent issue.  See prior visits From April through most recent visit May 31.  Thought to have component of reactive airway with viral syndromes, possible interstitial edema on previous x-rays.  Some persistent cough, primarily at night at her last visit.  Infrequent heartburn.  Pepcid AC at night.  Dry cough.  Occasional wheeze, sick contacts with grandchildren, minimal rhinorrhea. Restarted Flonase with consistent use, omeprazole once per day for possible upper airway cough/heartburn in addition to Pepcid AC.  Flovent inhaler twice daily, albuterol if needed. Chest x-ray June 2 no acute cardiopulmonary disease.  BNP 107 on June 2, borderline leukocytosis of 11.1.  Creatinine slightly elevated at 1.41, minimal change compared to previous readings in March of last year. Overall seems to be getting better. No fever. Drinking fluids. Still some cough with feeling mucus is still in chest.  Using flovent - now down to 1 puff once per day. Rare wheeze at night, not using albuterol.  Using flonase ns and omeprazole - no further heartburn.  Safe tussin at night if needed - not taken last night.  Minimal watery nose at times.   Depression: Followed by psychiatry, Dr. Evelene Croon. Prior intensive outpatient therapy. Some increased stress - has discussed outpatient therapy with psychiatry- plans to to restart.  Not out of cymbalta yet Taking 1/2 xanax at night.      05/14/2022   11:27 AM 04/29/2022   11:53 AM 03/18/2022    8:04 AM 01/06/2022    3:07 PM 12/15/2021   11:20 AM   Depression screen PHQ 2/9  Decreased Interest 2 2 2  0 1  Down, Depressed, Hopeless 2 2 2  0 1  PHQ - 2 Score 4 4 4  0 2  Altered sleeping 1 1 3  0 3  Tired, decreased energy 1 2 2  0 1  Change in appetite 2 3 1  0 1  Feeling bad or failure about yourself  2 2 0 0 1  Trouble concentrating 2 3 2  0 0  Moving slowly or fidgety/restless 3 3 1  0 1  Suicidal thoughts 0 0 0 0 0  PHQ-9 Score 15 18 13  0 9  Difficult doing work/chores   Very difficult Not difficult at all      History Patient Active Problem List   Diagnosis Date Noted   Shortness of breath at rest 05/09/2021   Loud snoring 03/24/2021   Excessive daytime sleepiness 03/24/2021   Acute on chronic combined systolic and diastolic HF (heart failure) (HCC) 03/24/2021   Panlobular emphysema (HCC) 03/24/2021   GAD (generalized anxiety disorder) 03/24/2021   Atypical chest pain 01/22/2021   Respiratory distress 01/20/2021   Acute on chronic combined systolic and diastolic CHF (congestive heart failure) (HCC) 01/20/2021   Type 2 diabetes mellitus without complication, without long-term current use of insulin (HCC) 10/26/2017   Generalized anxiety disorder 01/10/2014   Vitamin D deficiency 01/10/2014   Essential hypertension, benign 01/10/2014   Pure hypercholesterolemia 01/10/2014   Hypothyroidism 01/10/2014   Past  Medical History:  Diagnosis Date   Allergy    Anxiety    CHF (congestive heart failure) (HCC) 01/20/2021   COPD (chronic obstructive pulmonary disease) (HCC)    Coronary artery calcification    Depression    Diabetes mellitus without complication (HCC)    Diverticulitis    with colon resection   GERD (gastroesophageal reflux disease)    on pepcid   Hyperlipidemia    Hypertension    Sleep apnea    cpap on order 01-01-22   Sleep paralysis    will be tested for narcolepsy per pt 01-01-22   Thyroid disease    Past Surgical History:  Procedure Laterality Date   BREAST EXCISIONAL BIOPSY Left    CESAREAN SECTION      x2   CHOLECYSTECTOMY     COLON SURGERY     diverticulitis with perforation; s/p colon resection.   Colonoscopy     COLONOSCOPY     DENTAL SURGERY     RIGHT/LEFT HEART CATH AND CORONARY ANGIOGRAPHY N/A 01/22/2021   Procedure: RIGHT/LEFT HEART CATH AND CORONARY ANGIOGRAPHY;  Surgeon: Yates Decamp, MD;  Location: [facility];  Service: Cardiovascular;  Laterality: N/A;   TUBAL LIGATION     Allergies  Allergen Reactions   Metformin And Related Other (See Comments)    Could not swallow   Nicotine Rash    Only allergic to nicotine patch   Ace Inhibitors Cough    Cough with lisinopril.    Prior to Admission medications   Medication Sig Start Date End Date Taking? Authorizing Provider  ACCU-CHEK AVIVA PLUS test strip daily. for testing 03/11/18  Yes [provider]  albuterol (VENTOLIN HFA) 108 (90 Base) MCG/ACT inhaler Inhale 1 puff into the lungs every 6 (six) hours as needed for wheezing or shortness of breath.   Yes [provider]  ALPRAZolam Prudy Feeler) 1 MG tablet Take 0.5 mg by mouth 3 (three) times daily as needed for anxiety or sleep.  08/21/13  Yes [provider]  aspirin 81 MG tablet Take 81 mg by mouth daily.   Yes [provider]  BLACK ELDERBERRY PO Take 10 mLs by mouth daily.   Yes [provider]  cetirizine (ZYRTEC) 10 MG tablet Take 10 mg by mouth daily.   Yes [provider]  Continuous Blood Gluc Receiver (DEXCOM G6 RECEIVER) DEVI 1 Device by Does not apply route every morning. Device and supplies 04/18/20  Yes Shade Flood, MD  dapagliflozin propanediol (FARXIGA) 10 MG TABS tablet Take 1 tablet (10 mg total) by mouth daily. 04/30/22  Yes Tolia, Sunit, DO  Dulaglutide 1.5 MG/0.5ML SOPN Inject 1.5 mg into the skin once a week. 09/11/21  Yes Janeece Agee, NP  DULoxetine (CYMBALTA) 60 MG capsule TAKE 2 CAPSULES BY MOUTH DAILY 06/06/21  Yes Shade Flood, MD  ezetimibe (ZETIA) 10 MG tablet TAKE 1 TABLET BY MOUTH  EVERY DAY 04/06/22  Yes Shade Flood, MD  famotidine (PEPCID) 20 MG tablet Take 20 mg by mouth daily as needed for heartburn or indigestion.   Yes [provider]  fluticasone (FLONASE) 50 MCG/ACT nasal spray Place 2 sprays into both nostrils daily. 03/15/22  Yes Leath-Warren, Sadie Haber, NP  insulin glargine (LANTUS SOLOSTAR) 100 UNIT/ML Solostar Pen Inject 54 Units into the skin at bedtime. 04/06/22  Yes Shade Flood, MD  Insulin Pen Needle 31G X 5 MM MISC Inject 1 each into the skin daily. 02/10/21  Yes Shade Flood,  MD  levothyroxine (SYNTHROID) 88 MCG tablet TAKE 1 TABLET BY MOUTH EVERY DAY BEFORE BREAKFAST 01/07/22  Yes Shade Flood, MD  magnesium oxide (MAG-OX) 400 (240 Mg) MG tablet TAKE 0.5 TABLETS (200 MG TOTAL) BY MOUTH 2 TIMES DAILY. 01/06/22  Yes Tolia, Sunit, DO  metoprolol succinate (TOPROL-XL) 100 MG 24 hr tablet TAKE 1 TABLET BY MOUTH DAILY. TAKE WITH OR IMMEDIATELY FOLLOWING A MEAL. 12/29/21  Yes Tolia, Sunit, DO  nitroGLYCERIN (NITROSTAT) 0.4 MG SL tablet PLACE 1 TABLET UNDER THE TONGUE EVERY 5 MINUTES AS NEEDED FOR CHEST PAIN. IF YOU REQUIRE MORE THAN TWO TABLETS FIVE MINUTES APART GO TO THE NEAREST ER VIA EMS. Patient taking differently: Place 0.4 mg under the tongue every 5 (five) minutes as needed for chest pain. 07/29/20  Yes Tolia, Sunit, DO  omeprazole (PRILOSEC) 20 MG capsule Take 1 capsule (20 mg total) by mouth daily. 04/29/22  Yes Shade Flood, MD  ondansetron (ZOFRAN-ODT) 4 MG disintegrating tablet Take 1 tablet (4 mg total) by mouth every 8 (eight) hours as needed. 02/01/22  Yes Tomi Bamberger, PA-C  pravastatin (PRAVACHOL) 80 MG tablet TAKE 1 TABLET BY MOUTH EVERY EVENING 11/29/21  Yes Shade Flood, MD  predniSONE (DELTASONE) 20 MG tablet Take 2 tablets (40 mg total) by mouth daily with breakfast. 03/26/22  Yes Shade Flood, MD  promethazine-dextromethorphan (PROMETHAZINE-DM) 6.25-15 MG/5ML syrup Take 5 mLs by mouth 4 (four) times daily as  needed for cough. 03/15/22  Yes Leath-Warren, Sadie Haber, NP  sacubitril-valsartan (ENTRESTO) 97-103 MG Take 1 tablet by mouth 2 (two) times daily. 02/05/22  Yes Tolia, Sunit, DO  spironolactone (ALDACTONE) 25 MG tablet TAKE 1 TABLET BY MOUTH EVERY DAY 03/24/22  Yes Shade Flood, MD  Vitamin D, Ergocalciferol, (DRISDOL) 1.25 MG (50000 UNIT) CAPS capsule TAKE 1 CAPSULE (50,000 UNITS TOTAL) BY MOUTH EVERY 7 (SEVEN) DAYS 03/24/22  Yes Shade Flood, MD   Social History   Socioeconomic History   Marital status: Married    Spouse name: Not on file   Number of children: 2   Years of education: some college   Highest education level: Not on file  Occupational History   Occupation: Retired  Tobacco Use   Smoking status: Every Day    Packs/day: 0.25    Years: 38.00    Total pack years: 9.50    Types: Cigarettes   Smokeless tobacco: Never   Tobacco comments:    2-5 A DAY  Vaping Use   Vaping Use: Never used  Substance and Sexual Activity   Alcohol use: No    Alcohol/week: 0.0 standard drinks of alcohol   Drug use: No   Sexual activity: Not Currently  Other Topics Concern   Not on file  Social History Narrative   Marital status: married      Children:  2 children; 4 grandchildren      Lives: with husband, 2 granddaughters      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   Left-handed.   Caffeine use: 2.5 cups caffeine per day.   Social Determinants of Health   Financial Resource Strain: Low Risk  (01/22/2021)   Overall Financial Resource Strain (CARDIA)    Difficulty of Paying Living Expenses: Not very hard  Food Insecurity: No Food Insecurity (01/22/2021)   Hunger Vital Sign    Worried About Running Out of Food in  the Last Year: Never true    Ran Out of Food in the Last Year: Never true  Transportation Needs: No Transportation Needs (01/22/2021)   PRAPARE - Administrator, Civil Service (Medical): No     Lack of Transportation (Non-Medical): No  Physical Activity: Not on file  Stress: Not on file  Social Connections: Not on file  Intimate Partner Violence: Not on file    Review of Systems Per HPI   Objective:   Vitals:   05/14/22 1122  BP: 126/70  Pulse: 82  Resp: 15  Temp: 97.8 F (36.6 C)  TempSrc: Temporal  SpO2: 98%  Weight: 162 lb (73.5 kg)  Height: 5' (1.524 m)     Physical Exam Vitals reviewed.  Constitutional:      General: She is not in acute distress.    Appearance: She is well-developed.  HENT:     Head: Normocephalic and atraumatic.     Right Ear: Hearing, tympanic membrane, ear canal and external ear normal.     Left Ear: Hearing, tympanic membrane, ear canal and external ear normal.     Nose: Nose normal.     Comments: Dry nasal mucosa at present.     Mouth/Throat:     Pharynx: No posterior oropharyngeal erythema.  Eyes:     Conjunctiva/sclera: Conjunctivae normal.     Pupils: Pupils are equal, round, and reactive to light.  Cardiovascular:     Rate and Rhythm: Normal rate and regular rhythm.     Heart sounds: Normal heart sounds. No murmur heard. Pulmonary:     Effort: Pulmonary effort is normal. No respiratory distress.     Breath sounds: Normal breath sounds. No wheezing or rhonchi.  Skin:    General: Skin is warm and dry.     Findings: No rash.  Neurological:     Mental Status: She is alert and oriented to person, place, and time.  Psychiatric:        Mood and Affect: Mood normal.        Behavior: Behavior normal.        Assessment & Plan:  Felicia Hanson is a 66 y.o. female . Subacute cough - Plan: Ambulatory referral to Pulmonology Rhinorrhea - Plan: azelastine (ASTELIN) 0.1 % nasal spray  -Persistent cough but improving.  Still suspect upper airway irritation from postnasal drip versus asthma, less likely CHF with x-ray clear last visit.  Less likely infectious.  Some improvement may be related to PPI use, continue same.  Try  increasing inhaled corticosteroid to twice daily dosing with option of 2 puffs.  Astelin nasal spray if rhinorrhea, side effects discussed.  Refer to pulmonary with RTC precautions.  Depression with anxiety - Plan: DULoxetine (CYMBALTA) 60 MG capsule  -Cymbalta refilled but future refills from her psychiatrist.  Elevated serum creatinine - Plan: Basic metabolic panel  -Updated labs ordered.  Maintain hydration, avoid NSAIDs.  Leukocytosis, unspecified type - Plan: CBC  -Borderline, afebrile.  Repeat CBC.  Meds ordered this encounter  Medications   DULoxetine (CYMBALTA) 60 MG capsule    Sig: Take 2 capsules (120 mg total) by mouth daily.    Dispense:  180 capsule    Refill:  1   fluticasone (FLOVENT HFA) 44 MCG/ACT inhaler    Sig: Inhale 1-2 puffs into the lungs in the morning and at bedtime.    Dispense:  1 each    Refill:  12   azelastine (ASTELIN) 0.1 % nasal spray  Sig: Place 1 spray into both nostrils 2 (two) times daily. Use in each nostril as directed if runny nose/drainage.    Dispense:  30 mL    Refill:  1   Patient Instructions  Try increasing flovent to twice per day, and if continued feeling of wheeze at night, increase from 1 to 2 puffs at a time.  Astelin nasal spray only if more runny nose.  Continue other meds including omeprazole, flonase and pepcid.  Please call your psychiatrist if any refills needed on your psychiatric medicines.  I will refer you to pulmonary to discuss cough.  Return to the clinic or go to the nearest emergency room if any of your symptoms worsen or new symptoms occur.     Signed,   Meredith Staggers, MD [facility], [facility] 05/14/22 12:36 PM

## 2022-05-25 ENCOUNTER — Encounter: Payer: Self-pay | Admitting: Neurology

## 2022-05-25 ENCOUNTER — Ambulatory Visit: Payer: Medicare Other | Admitting: Neurology

## 2022-05-25 VITALS — BP 142/69 | HR 92 | Ht 60.0 in | Wt 162.0 lb

## 2022-05-25 DIAGNOSIS — G250 Essential tremor: Secondary | ICD-10-CM

## 2022-05-25 MED ORDER — TOPIRAMATE 50 MG PO TABS: 50.0000 mg | ORAL_TABLET | Freq: Two times a day (BID) | ORAL | 1 refills | Status: DC
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
## 2022-05-26 ENCOUNTER — Other Ambulatory Visit: Payer: Self-pay | Admitting: Family Medicine

## 2022-05-26 DIAGNOSIS — J3489 Other specified disorders of nose and nasal sinuses: Secondary | ICD-10-CM

## 2022-05-28 ENCOUNTER — Telehealth: Payer: Self-pay | Admitting: Family Medicine

## 2022-05-28 ENCOUNTER — Telehealth (HOSPITAL_COMMUNITY): Payer: Self-pay | Admitting: Psychiatry

## 2022-05-28 NOTE — Telephone Encounter (Signed)
I left a detailed VM stating that she must be seen we can not call in any medication for her throat without being seen by a provider . I ask her to call the office to scheduled or we could scheduled her a video visit with DR Selena Batten

## 2022-05-28 NOTE — Telephone Encounter (Signed)
D:  Dr. Milagros Evener referred pt to [facility]-IOP.  A:  Oriented pt to [facility]-IOP.  Pt has concerns about her financial situation; would like to contact her insurance company before making a decision about starting.  Encouraged pt to call the case manager back after she speaks to her insurance company.  R:  Pt receptive.

## 2022-05-28 NOTE — Telephone Encounter (Signed)
PT called stating that she need a antibiotic for throat. Pt denied appt. Pt want to speak with a nurse.

## 2022-05-29 ENCOUNTER — Other Ambulatory Visit: Payer: Self-pay | Admitting: Family Medicine

## 2022-05-29 DIAGNOSIS — E1165 Type 2 diabetes mellitus with hyperglycemia: Secondary | ICD-10-CM

## 2022-05-29 DIAGNOSIS — E661 Drug-induced obesity: Secondary | ICD-10-CM

## 2022-05-29 NOTE — Telephone Encounter (Signed)
Continue on Trulicity January visit when discussing diabetes.  Refill ordered.

## 2022-05-31 IMAGING — DX DG CHEST 2V
2 series · 2 of 2 positions shown · non-contrast
Comparison: Chest x-ray May 23, 2021.

CLINICAL DATA: cough, fever. hx of chf. r/o infiltrate, edema.

EXAM:
CHEST - 2 VIEW

[chest pa]
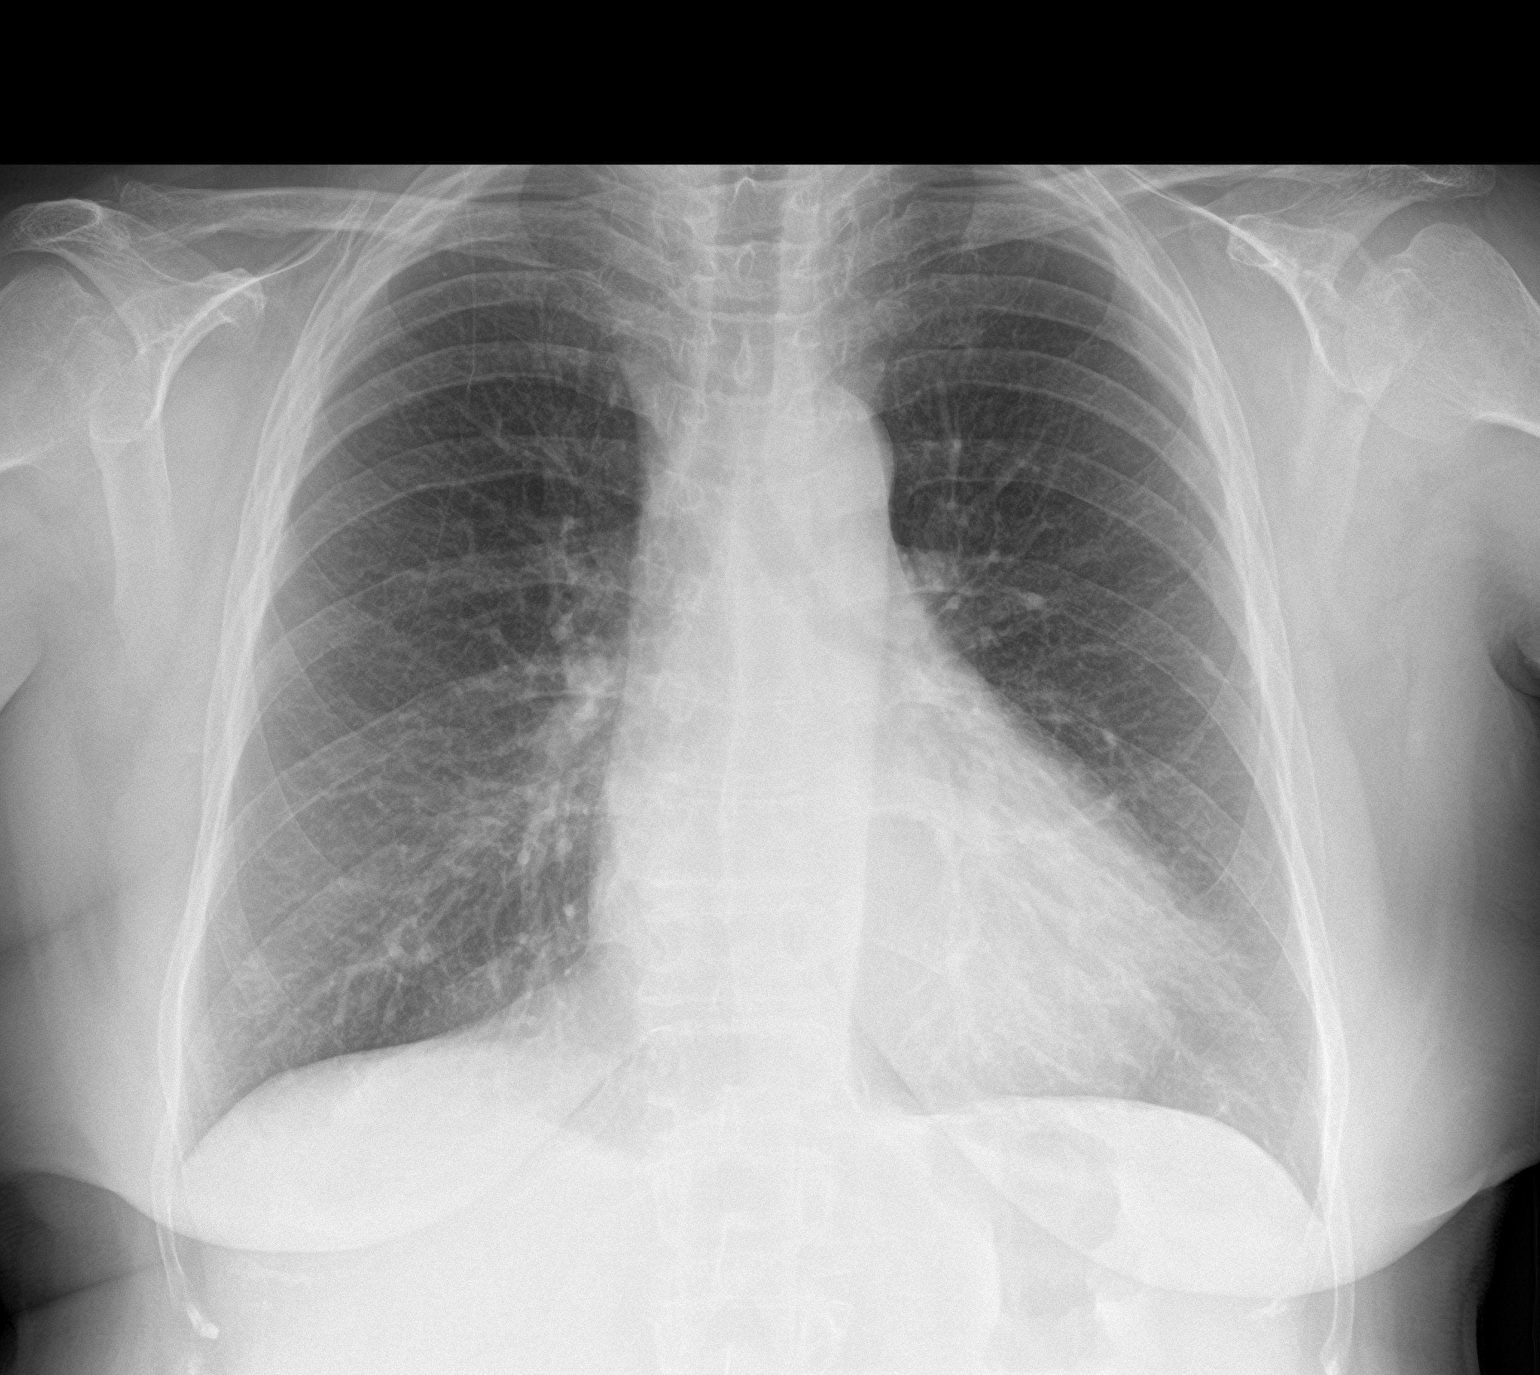

[chest lat]
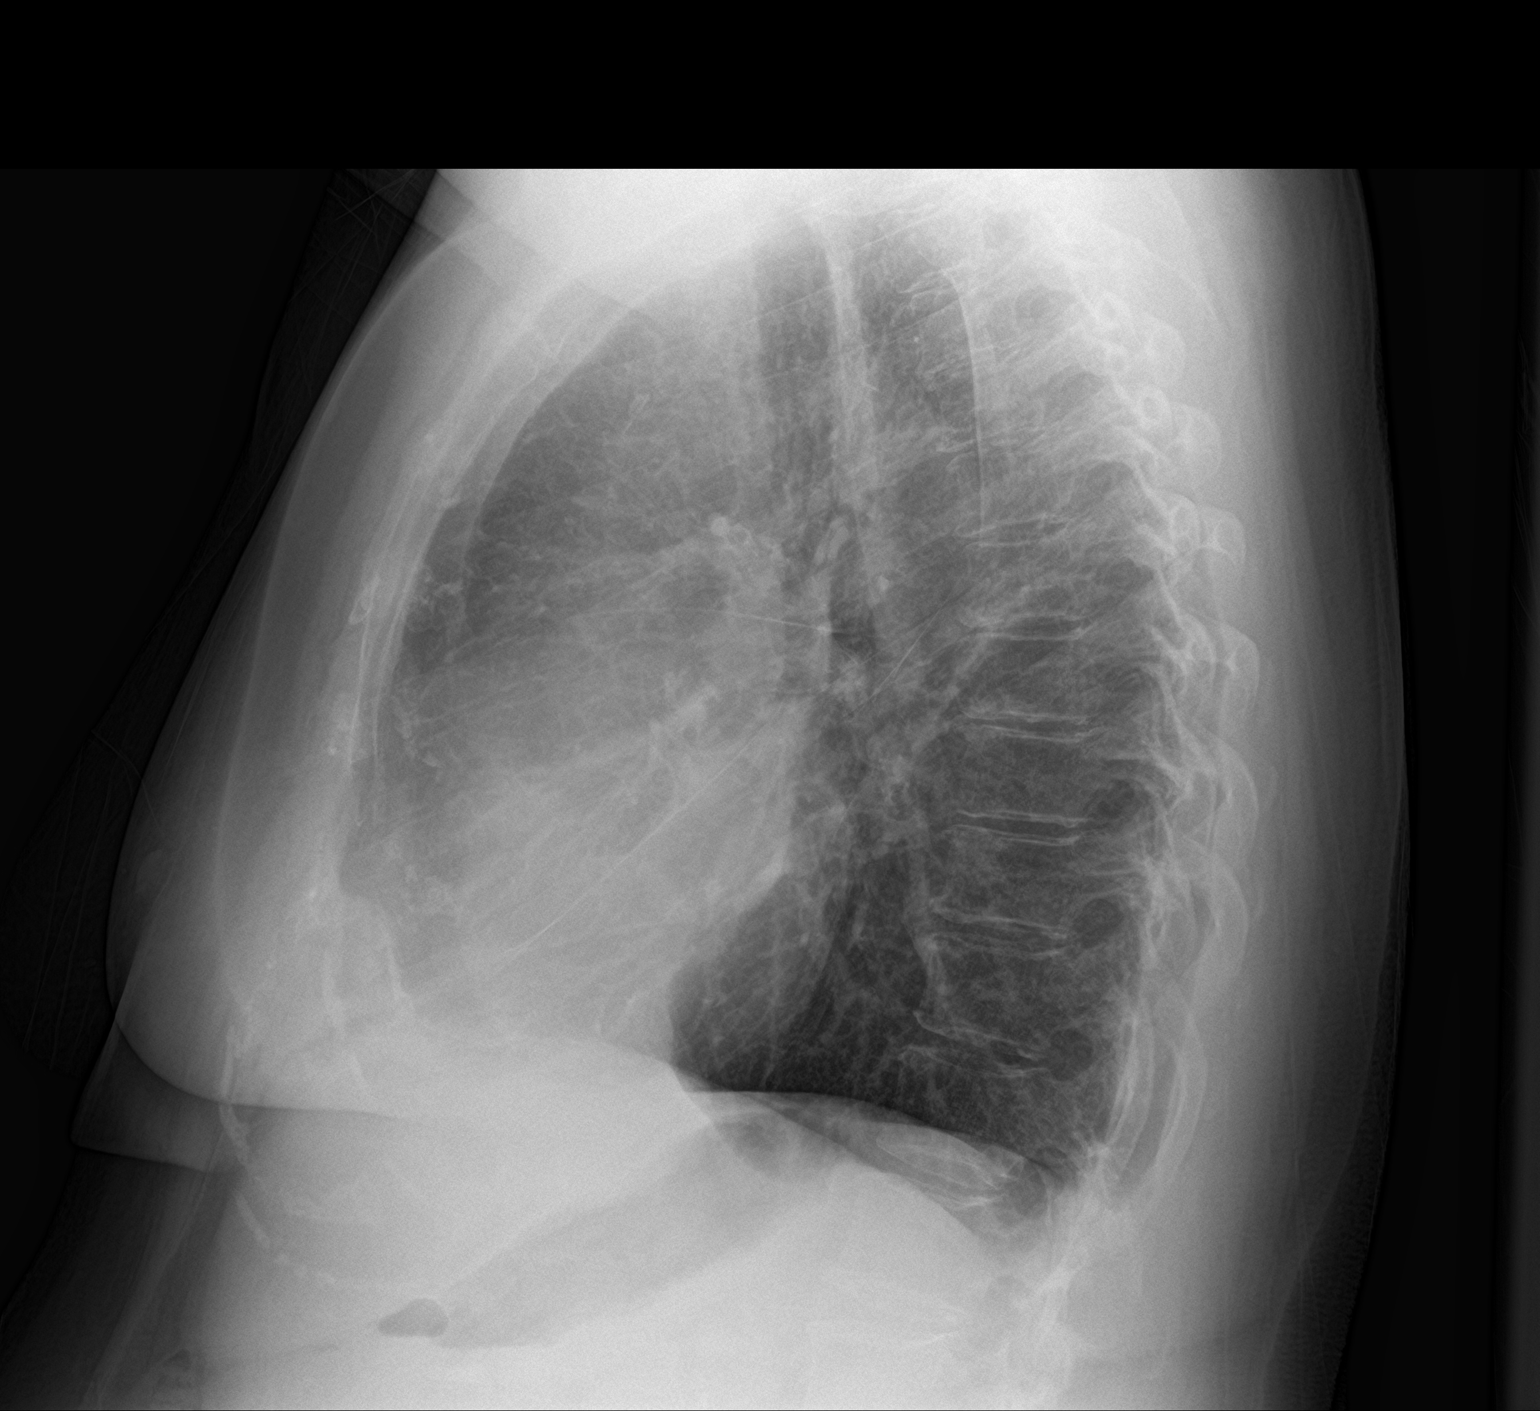

[2 of 2 positions shown; findings below may reference images not displayed]

FINDINGS: Similar mild interstitial prominence. No confluent consolidation. No
visible pleural effusions or pneumothorax. Cardiomediastinal
silhouette is within normal limits and similar to prior. No
displaced fracture.
IMPRESSION: Similar mild interstitial prominence, which could represent the
sequela of recurrent bouts of CHF versus mild interstitial edema. No
confluent consolidation.

## 2022-06-03 ENCOUNTER — Other Ambulatory Visit: Payer: Self-pay | Admitting: Neurology

## 2022-06-08 IMAGING — DX DG CHEST 2V
2 series · 2 of 2 positions shown · non-contrast
Comparison: Chest x-ray dated March 18, 2022

CLINICAL DATA: Cough and fever

EXAM:
CHEST - 2 VIEW

[chest pa]
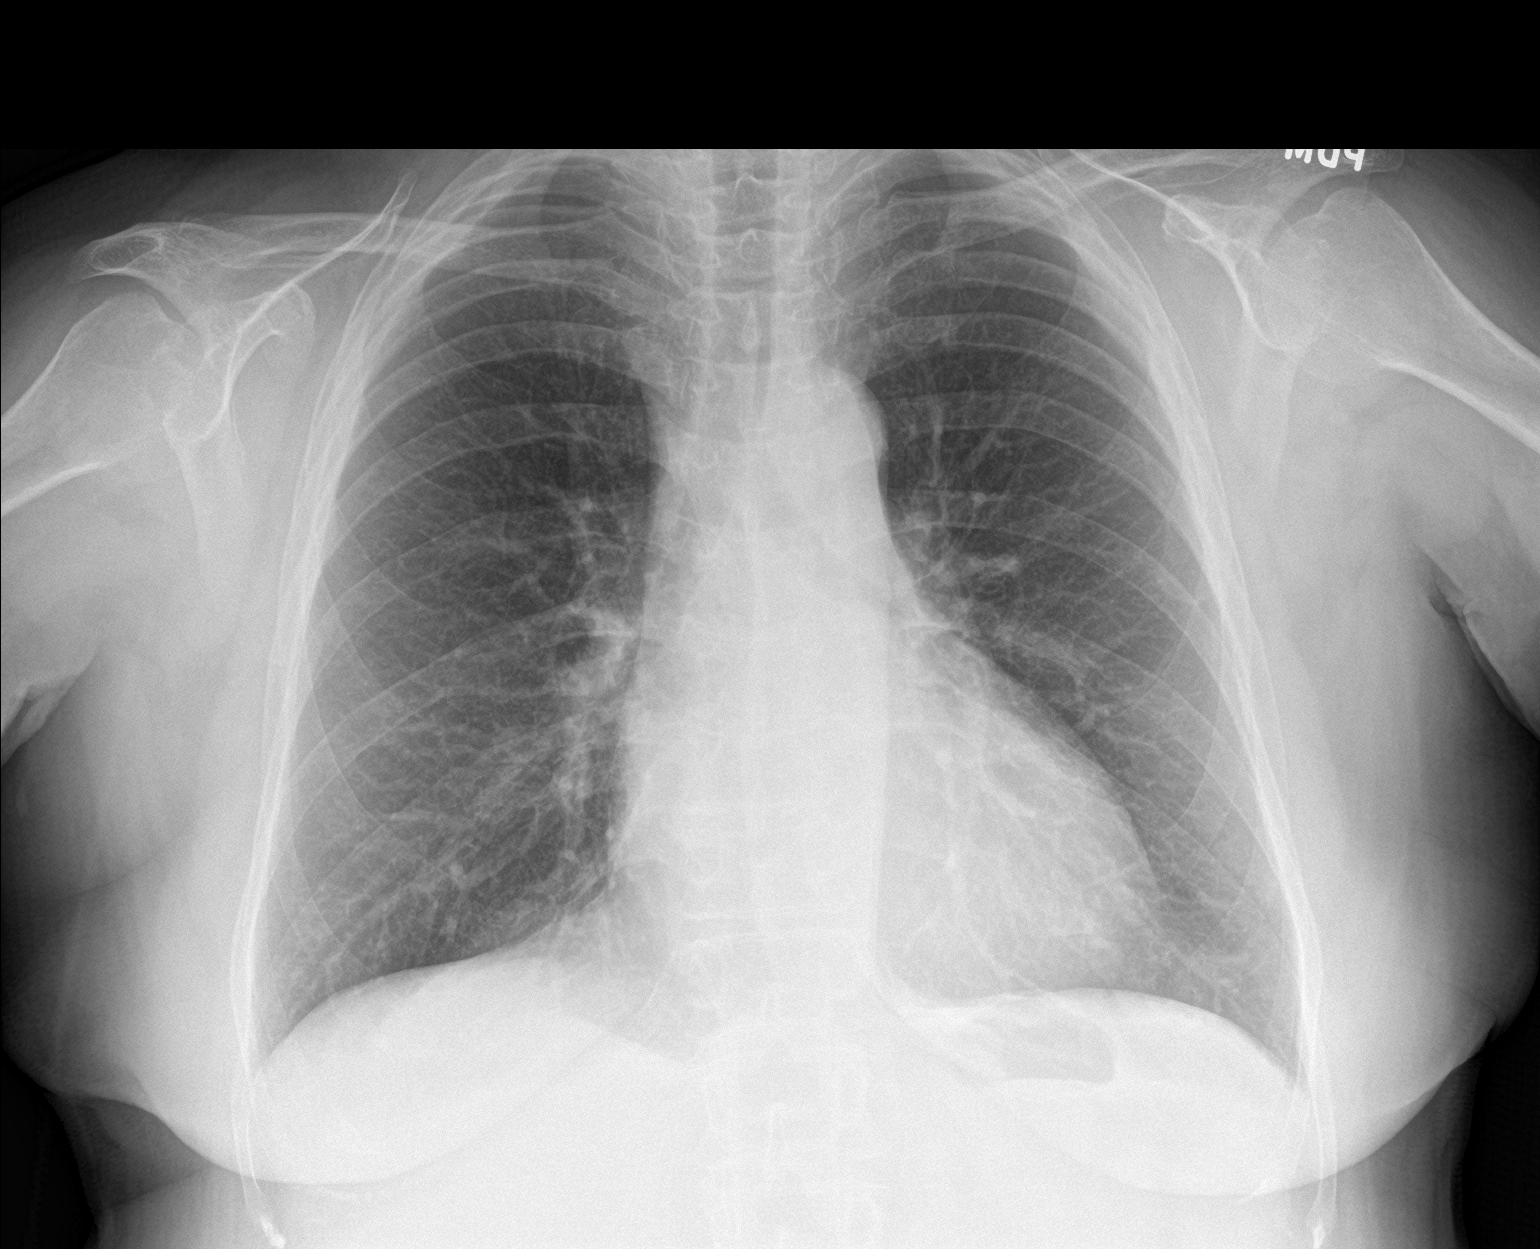

[chest lat]
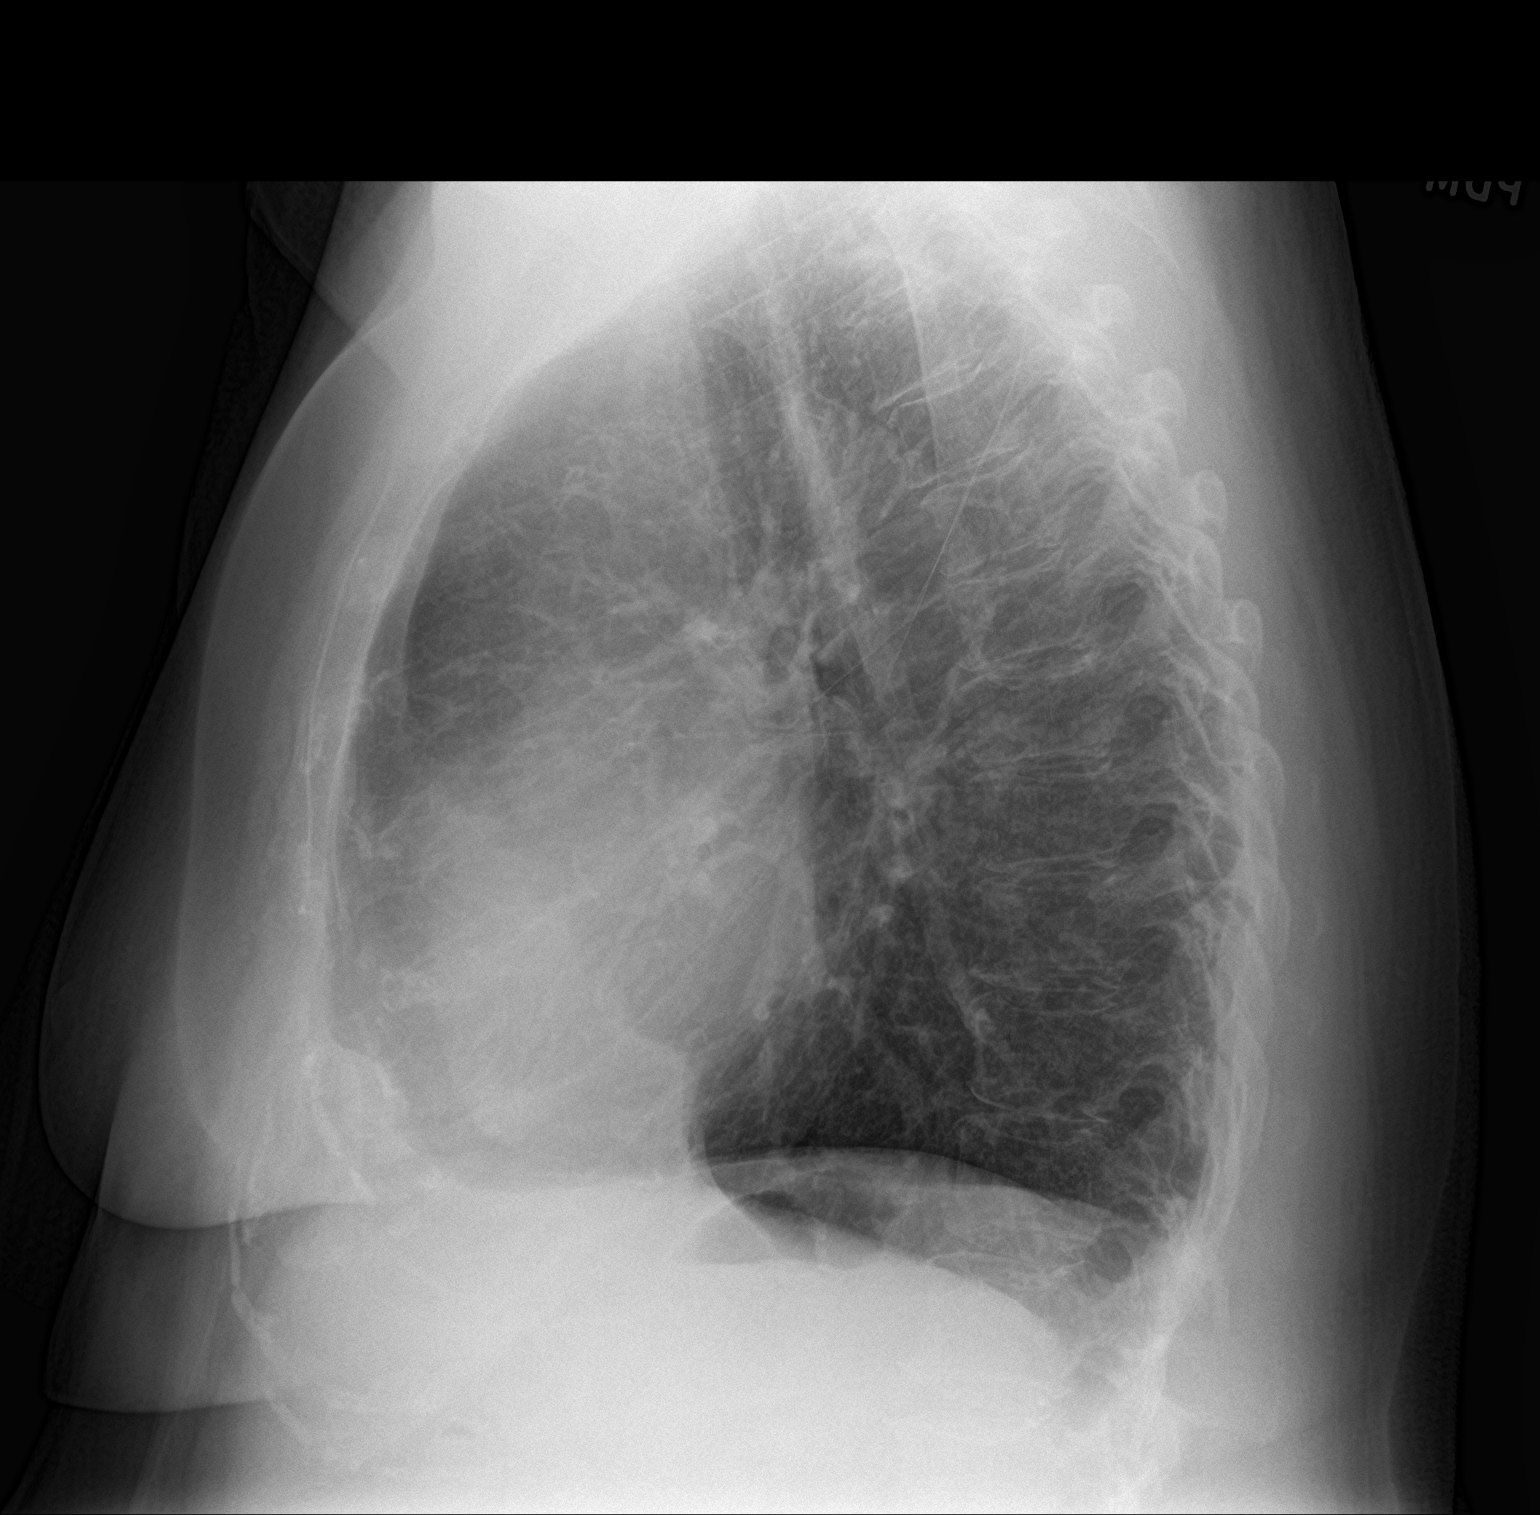

[2 of 2 positions shown; findings below may reference images not displayed]

FINDINGS: Heart is upper limits of normal in size, unchanged compared to prior
exams. Both lungs are clear. The visualized skeletal structures are
unremarkable.
IMPRESSION: No evidence of pneumonia.

## 2022-06-13 ENCOUNTER — Other Ambulatory Visit: Payer: Self-pay | Admitting: Family Medicine

## 2022-06-13 DIAGNOSIS — R052 Subacute cough: Secondary | ICD-10-CM

## 2022-06-13 DIAGNOSIS — K219 Gastro-esophageal reflux disease without esophagitis: Secondary | ICD-10-CM

## 2022-06-17 ENCOUNTER — Telehealth: Payer: Self-pay | Admitting: Family Medicine

## 2022-06-17 ENCOUNTER — Ambulatory Visit (INDEPENDENT_AMBULATORY_CARE_PROVIDER_SITE_OTHER): Payer: Medicare Other

## 2022-06-17 DIAGNOSIS — Z1231 Encounter for screening mammogram for malignant neoplasm of breast: Secondary | ICD-10-CM

## 2022-06-17 DIAGNOSIS — Z780 Asymptomatic menopausal state: Secondary | ICD-10-CM

## 2022-06-17 DIAGNOSIS — Z0000 Encounter for general adult medical examination without abnormal findings: Secondary | ICD-10-CM

## 2022-06-17 NOTE — Patient Instructions (Signed)
[name] , Thank you for taking time to come for your Medicare Wellness Visit. I appreciate your ongoing commitment to your health goals. Please review the following plan we discussed and let me know if I can assist you in the future.   Screening recommendations/referrals: Colonoscopy: declined at this time will discuss with PCP  Mammogram: ordered 06/17/2022 Bone Density: ordered 06/17/2022 Recommended yearly ophthalmology/optometry visit for glaucoma screening and checkup Recommended yearly dental visit for hygiene and checkup  Vaccinations: Influenza vaccine: completed  Pneumococcal vaccine: completed  Tdap vaccine: 01/10/2014 Shingles vaccine: will consider     Advanced directives: none   Conditions/risks identified: none   Next appointment: none    Preventive Care 65 Years and Older, Female Preventive care refers to lifestyle choices and visits with your health care provider that can promote health and wellness. What does preventive care include? A yearly physical exam. This is also called an annual well check. Dental exams once or twice a year. Routine eye exams. Ask your health care provider how often you should have your eyes checked. Personal lifestyle choices, including: Daily care of your teeth and gums. Regular physical activity. Eating a healthy diet. Avoiding tobacco and drug use. Limiting alcohol use. Practicing safe sex. Taking low-dose aspirin every day. Taking vitamin and mineral supplements as recommended by your health care provider. What happens during an annual well check? The services and screenings done by your health care provider during your annual well check will depend on your age, overall health, lifestyle risk factors, and family history of disease. Counseling  Your health care provider may ask you questions about your: Alcohol use. Tobacco use. Drug use. Emotional well-being. Home and relationship well-being. Sexual activity. Eating  habits. History of falls. Memory and ability to understand (cognition). Work and work Astronomer. Reproductive health. Screening  You may have the following tests or measurements: Height, weight, and BMI. Blood pressure. Lipid and cholesterol levels. These may be checked every 5 years, or more frequently if you are over 10 years old. Skin check. Lung cancer screening. You may have this screening every year starting at age 48 if you have a 30-pack-year history of smoking and currently smoke or have quit within the past 15 years. Fecal occult blood test (FOBT) of the stool. You may have this test every year starting at age 53. Flexible sigmoidoscopy or colonoscopy. You may have a sigmoidoscopy every 5 years or a colonoscopy every 10 years starting at age 13. Hepatitis C blood test. Hepatitis B blood test. Sexually transmitted disease (STD) testing. Diabetes screening. This is done by checking your blood sugar (glucose) after you have not eaten for a while (fasting). You may have this done every 1-3 years. Bone density scan. This is done to screen for osteoporosis. You may have this done starting at age 3. Mammogram. This may be done every 1-2 years. Talk to your health care provider about how often you should have regular mammograms. Talk with your health care provider about your test results, treatment options, and if necessary, the need for more tests. Vaccines  Your health care provider may recommend certain vaccines, such as: Influenza vaccine. This is recommended every year. Tetanus, diphtheria, and acellular pertussis (Tdap, Td) vaccine. You may need a Td booster every 10 years. Zoster vaccine. You may need this after age 3. Pneumococcal 13-valent conjugate (PCV13) vaccine. One dose is recommended after age 52. Pneumococcal polysaccharide (PPSV23) vaccine. One dose is recommended after age 73. Talk to your health care provider  about which screenings and vaccines you need and how  often you need them. This information is not intended to replace advice given to you by your health care provider. Make sure you discuss any questions you have with your health care provider. Document Released: 12/13/2015 Document Revised: 08/05/2016 Document Reviewed: 09/17/2015 Elsevier Interactive Patient Education  2017 [place] Prevention in the Home Falls can cause injuries. They can happen to people of all ages. There are many things you can do to make your home safe and to help prevent falls. What can I do on the outside of my home? Regularly fix the edges of walkways and driveways and fix any cracks. Remove anything that might make you trip as you walk through a door, such as a raised step or threshold. Trim any bushes or trees on the path to your home. Use bright outdoor lighting. Clear any walking paths of anything that might make someone trip, such as rocks or tools. Regularly check to see if handrails are loose or broken. Make sure that both sides of any steps have handrails. Any raised decks and porches should have guardrails on the edges. Have any leaves, snow, or ice cleared regularly. Use sand or salt on walking paths during winter. Clean up any spills in your garage right away. This includes oil or grease spills. What can I do in the bathroom? Use night lights. Install grab bars by the toilet and in the tub and shower. Do not use towel bars as grab bars. Use non-skid mats or decals in the tub or shower. If you need to sit down in the shower, use a plastic, non-slip stool. Keep the floor dry. Clean up any water that spills on the floor as soon as it happens. Remove soap buildup in the tub or shower regularly. Attach bath mats securely with double-sided non-slip rug tape. Do not have throw rugs and other things on the floor that can make you trip. What can I do in the bedroom? Use night lights. Make sure that you have a light by your bed that is easy to  reach. Do not use any sheets or blankets that are too big for your bed. They should not hang down onto the floor. Have a firm chair that has side arms. You can use this for support while you get dressed. Do not have throw rugs and other things on the floor that can make you trip. What can I do in the kitchen? Clean up any spills right away. Avoid walking on wet floors. Keep items that you use a lot in easy-to-reach places. If you need to reach something above you, use a strong step stool that has a grab bar. Keep electrical cords out of the way. Do not use floor polish or wax that makes floors slippery. If you must use wax, use non-skid floor wax. Do not have throw rugs and other things on the floor that can make you trip. What can I do with my stairs? Do not leave any items on the stairs. Make sure that there are handrails on both sides of the stairs and use them. Fix handrails that are broken or loose. Make sure that handrails are as long as the stairways. Check any carpeting to make sure that it is firmly attached to the stairs. Fix any carpet that is loose or worn. Avoid having throw rugs at the top or bottom of the stairs. If you do have throw rugs, attach them to the floor  with carpet tape. Make sure that you have a light switch at the top of the stairs and the bottom of the stairs. If you do not have them, ask someone to add them for you. What else can I do to help prevent falls? Wear shoes that: Do not have high heels. Have rubber bottoms. Are comfortable and fit you well. Are closed at the toe. Do not wear sandals. If you use a stepladder: Make sure that it is fully opened. Do not climb a closed stepladder. Make sure that both sides of the stepladder are locked into place. Ask someone to hold it for you, if possible. Clearly mark and make sure that you can see: Any grab bars or handrails. First and last steps. Where the edge of each step is. Use tools that help you move  around (mobility aids) if they are needed. These include: Canes. Walkers. Scooters. Crutches. Turn on the lights when you go into a dark area. Replace any light bulbs as soon as they burn out. Set up your furniture so you have a clear path. Avoid moving your furniture around. If any of your floors are uneven, fix them. If there are any pets around you, be aware of where they are. Review your medicines with your doctor. Some medicines can make you feel dizzy. This can increase your chance of falling. Ask your doctor what other things that you can do to help prevent falls. This information is not intended to replace advice given to you by your health care provider. Make sure you discuss any questions you have with your health care provider. Document Released: 09/12/2009 Document Revised: 04/23/2016 Document Reviewed: 12/21/2014 Elsevier Interactive Patient Education  2017 [employer].

## 2022-06-17 NOTE — Telephone Encounter (Signed)
Please addend order it wont let me do this on your behalf

## 2022-06-17 NOTE — Addendum Note (Signed)
Addended by: Lorrene Reid on: 06/17/2022 02:10 PM   Modules accepted: Orders

## 2022-06-17 NOTE — Telephone Encounter (Signed)
Caller name: Sue Lush from [facility]  On DPR? :yes/no: No No  Call back number:[phone]  Provider they see: Dr. Neva Seat  Reason for call:  Sue Lush states that patient needs a new order for her Breast Bilateral. Sue Lush states that post menopause isn't a vault diagnose for this. If you have any question please contact Sue Lush at  [phone] (hit option 1 then option .

## 2022-06-17 NOTE — Progress Notes (Signed)
Subjective:   Felicia Hanson is a 66 y.o. female who presents for an Initial Medicare Annual Wellness Visit.   I connected with Felicia Hanson  today by telephone and verified that I am speaking with the correct person using two identifiers. Location patient: home Location provider: work Persons participating in the virtual visit: patient, provider.   I discussed the limitations, risks, security and privacy concerns of performing an evaluation and management service by telephone and the availability of in person appointments. I also discussed with the patient that there may be a patient responsible charge related to this service. The patient expressed understanding and verbally consented to this telephonic visit.    Interactive audio and video telecommunications were attempted between this provider and patient, however failed, due to patient having technical difficulties OR patient did not have access to video capability.  We continued and completed visit with audio only.    Review of Systems     Cardiac Risk Factors include: advanced age (>84men, >28 women);diabetes mellitus;hypertension;dyslipidemia     Objective:    Today's Vitals   There is no height or weight on file to calculate BMI.     06/17/2022    9:17 AM 01/20/2021    1:31 AM 09/13/2016    3:58 PM  Advanced Directives  Does Patient Have a Medical Advance Directive? No No No  Would patient like information on creating a medical advance directive? No - Patient declined No - Patient declined No - patient declined information    Current Medications (verified) Outpatient Encounter Medications as of 06/17/2022  Medication Sig   ACCU-CHEK AVIVA PLUS test strip daily. for testing   albuterol (VENTOLIN HFA) 108 (90 Base) MCG/ACT inhaler Inhale 1 puff into the lungs every 6 (six) hours as needed for wheezing or shortness of breath.   ALPRAZolam (XANAX) 1 MG tablet Take 0.5 mg by mouth 3 (three) times daily as needed for  anxiety or sleep.    aspirin 81 MG tablet Take 81 mg by mouth daily.   Azelastine HCl 137 MCG/SPRAY SOLN PLACE 1 SPRAY INTO BOTH NOSTRILS 2 (TWO) TIMES DAILY. USE IN EACH NOSTRIL AS DIRECTED IF RUNNY NOSE/DRAINAGE.   BLACK ELDERBERRY PO Take 10 mLs by mouth daily.   cetirizine (ZYRTEC) 10 MG tablet Take 10 mg by mouth daily.   dapagliflozin propanediol (FARXIGA) 10 MG TABS tablet Take 1 tablet (10 mg total) by mouth daily.   Dulaglutide (TRULICITY) 1.5 0000000 SOPN INJECT 1.5 MG (0.5ML) UNDER THE SKIN ONCE A WEEK   DULoxetine (CYMBALTA) 60 MG capsule Take 2 capsules (120 mg total) by mouth daily.   ezetimibe (ZETIA) 10 MG tablet TAKE 1 TABLET BY MOUTH EVERY DAY   famotidine (PEPCID) 20 MG tablet Take 20 mg by mouth daily as needed for heartburn or indigestion.   fluticasone (FLONASE) 50 MCG/ACT nasal spray Place 2 sprays into both nostrils daily.   fluticasone (FLOVENT HFA) 44 MCG/ACT inhaler Inhale 1-2 puffs into the lungs in the morning and at bedtime.   insulin glargine (LANTUS SOLOSTAR) 100 UNIT/ML Solostar Pen Inject 54 Units into the skin at bedtime.   Insulin Pen Needle 31G X 5 MM MISC Inject 1 each into the skin daily.   levothyroxine (SYNTHROID) 88 MCG tablet TAKE 1 TABLET BY MOUTH EVERY DAY BEFORE BREAKFAST   magnesium oxide (MAG-OX) 400 (240 Mg) MG tablet TAKE 0.5 TABLETS (200 MG TOTAL) BY MOUTH 2 TIMES DAILY.   metoprolol succinate (TOPROL-XL) 100 MG 24 hr tablet TAKE  1 TABLET BY MOUTH DAILY. TAKE WITH OR IMMEDIATELY FOLLOWING A MEAL.   nitroGLYCERIN (NITROSTAT) 0.4 MG SL tablet PLACE 1 TABLET UNDER THE TONGUE EVERY 5 MINUTES AS NEEDED FOR CHEST PAIN. IF YOU REQUIRE MORE THAN TWO TABLETS FIVE MINUTES APART GO TO THE NEAREST ER VIA EMS. (Patient taking differently: Place 0.4 mg under the tongue every 5 (five) minutes as needed for chest pain.)   ondansetron (ZOFRAN-ODT) 4 MG disintegrating tablet Take 1 tablet (4 mg total) by mouth every 8 (eight) hours as needed.   pravastatin  (PRAVACHOL) 80 MG tablet TAKE 1 TABLET BY MOUTH EVERY EVENING   sacubitril-valsartan (ENTRESTO) 97-103 MG Take 1 tablet by mouth 2 (two) times daily.   spironolactone (ALDACTONE) 25 MG tablet TAKE 1 TABLET BY MOUTH EVERY DAY   topiramate (TOPAMAX) 50 MG tablet Take 1 tablet (50 mg total) by mouth 2 (two) times daily.   Vitamin D, Ergocalciferol, (DRISDOL) 1.25 MG (50000 UNIT) CAPS capsule TAKE 1 CAPSULE (50,000 UNITS TOTAL) BY MOUTH EVERY 7 (SEVEN) DAYS   Continuous Blood Gluc Receiver (DEXCOM G6 RECEIVER) DEVI 1 Device by Does not apply route every morning. Device and supplies (Patient not taking: Reported on 06/17/2022)   omeprazole (PRILOSEC) 20 MG capsule TAKE 1 CAPSULE BY MOUTH EVERY DAY (Patient not taking: Reported on 06/17/2022)   predniSONE (DELTASONE) 20 MG tablet Take 2 tablets (40 mg total) by mouth daily with breakfast. (Patient not taking: Reported on 06/17/2022)   promethazine-dextromethorphan (PROMETHAZINE-DM) 6.25-15 MG/5ML syrup Take 5 mLs by mouth 4 (four) times daily as needed for cough. (Patient not taking: Reported on 06/17/2022)   No facility-administered encounter medications on file as of 06/17/2022.    Allergies (verified) Metformin and related, Nicotine, and Ace inhibitors   History: Past Medical History:  Diagnosis Date   Allergy    Anxiety    CHF (congestive heart failure) ([place]) 01/20/2021   COPD (chronic obstructive pulmonary disease) ([facility])    Coronary artery calcification    Depression    Diabetes mellitus without complication ([place])    Diverticulitis    with colon resection   GERD (gastroesophageal reflux disease)    on pepcid   Hyperlipidemia    Hypertension    Sleep apnea    cpap on order 01-01-22   Sleep paralysis    will be tested for narcolepsy per pt 01-01-22   Thyroid disease    Past Surgical History:  Procedure Laterality Date   BREAST EXCISIONAL BIOPSY Left    CESAREAN SECTION     x2   CHOLECYSTECTOMY     COLON SURGERY     diverticulitis  with perforation; s/p colon resection.   Colonoscopy     COLONOSCOPY     DENTAL SURGERY     RIGHT/LEFT HEART CATH AND CORONARY ANGIOGRAPHY N/A 01/22/2021   Procedure: RIGHT/LEFT HEART CATH AND CORONARY ANGIOGRAPHY;  Surgeon: Adrian Prows, MD;  Location: [facility];  Service: Cardiovascular;  Laterality: N/A;   TUBAL LIGATION     Family History  Problem Relation Age of Onset   Colon polyps Mother    Cancer Mother        unknown primary   Hyperlipidemia Mother    Other Father    Breast cancer Paternal Aunt    Esophageal cancer Paternal Uncle    Colon cancer Neg Hx    Rectal cancer Neg Hx    Stomach cancer Neg Hx    Social History   Socioeconomic History   Marital status: Married  Spouse name: Not on file   Number of children: 2   Years of education: some college   Highest education level: Not on file  Occupational History   Occupation: Retired  Tobacco Use   Smoking status: Every Day    Packs/day: 0.25    Years: 38.00    Total pack years: 9.50    Types: Cigarettes   Smokeless tobacco: Never   Tobacco comments:    2-5 A DAY  Vaping Use   Vaping Use: Never used  Substance and Sexual Activity   Alcohol use: No    Alcohol/week: 0.0 standard drinks of alcohol   Drug use: No   Sexual activity: Not Currently  Other Topics Concern   Not on file  Social History Narrative   Marital status: married      Children:  2 children; 4 grandchildren      Lives: with husband, 2 granddaughters      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   Left-handed.   Caffeine use: 2.5 cups caffeine per day.   Social Determinants of Health   Financial Resource Strain: Low Risk  (06/17/2022)   Overall Financial Resource Strain (CARDIA)    Difficulty of Paying Living Expenses: Not hard at all  Food Insecurity: No Food Insecurity (06/17/2022)   Hunger Vital Sign    Worried About Running Out of Food in the Last Year: Never  true    Ran Out of Food in the Last Year: Never true  Transportation Needs: No Transportation Needs (06/17/2022)   PRAPARE - Hydrologist (Medical): No    Lack of Transportation (Non-Medical): No  Physical Activity: Insufficiently Active (06/17/2022)   Exercise Vital Sign    Days of Exercise per Week: 3 days    Minutes of Exercise per Session: 30 min  Stress: No Stress Concern Present (06/17/2022)   [place]    Feeling of Stress : Not at all  Social Connections: Moderately Integrated (06/17/2022)   Social Connection and Isolation Panel [NHANES]    Frequency of Communication with Friends and Family: Three times a week    Frequency of Social Gatherings with Friends and Family: Three times a week    Attends Religious Services: More than 4 times per year    Active Member of Clubs or Organizations: No    Attends Archivist Meetings: Never    Marital Status: Married    Tobacco Counseling Ready to quit: Not Answered Counseling given: Not Answered Tobacco comments: 2-5 A DAY   Clinical Intake:  Pre-visit preparation completed: Yes  Pain : No/denies pain     Nutritional Risks: None Diabetes: Yes CBG done?: No Did pt. bring in CBG monitor from home?: No  How often do you need to have someone help you when you read instructions, pamphlets, or other written materials from your doctor or pharmacy?: 1 - Never What is the last grade level you completed in school?: college  Diabetic?yes  Nutrition Risk Assessment:  Has the patient had any N/V/D within the last 2 months?  No  Does the patient have any non-healing wounds?  No  Has the patient had any unintentional weight loss or weight gain?  No   Diabetes:  Is the patient diabetic?  Yes  If diabetic, was a CBG obtained today?  No  Did  the patient bring in their glucometer from home?  No  How often do you monitor your CBG's? 2  x day .   Financial Strains and Diabetes Management:  Are you having any financial strains with the device, your supplies or your medication? No .  Does the patient want to be seen by Chronic Care Management for management of their diabetes?  No  Would the patient like to be referred to a Nutritionist or for Diabetic Management?  No   Diabetic Exams:  Diabetic Eye Exam: Completed 04/2022 Diabetic Foot Exam: Overdue, Pt has been advised about the importance in completing this exam. Pt is scheduled for diabetic foot exam on next office visit .   Interpreter Needed?: No  Information entered by :: L.Lashayla Armes,LPN   Activities of Daily Living    06/17/2022    9:19 AM  In your present state of health, do you have any difficulty performing the following activities:  Hearing? 0  Vision? 0  Difficulty concentrating or making decisions? 0  Walking or climbing stairs? 0  Dressing or bathing? 0  Doing errands, shopping? 0  Preparing Food and eating ? N  Using the Toilet? N  In the past six months, have you accidently leaked urine? N  Do you have problems with loss of bowel control? N  Managing your Medications? N  Managing your Finances? N  Housekeeping or managing your Housekeeping? N    Patient Care Team: Shade Flood, MD as PCP - General (Family Medicine)  Indicate any recent Medical Services you may have received from other than [facility] providers in the past year (date may be approximate).     Assessment:   This is a routine wellness examination for Kayonna.  Hearing/Vision screen Vision Screening - Comments:: Annual eye exams wear glasses   Dietary issues and exercise activities discussed: Current Exercise Habits: Home exercise routine, Type of exercise: walking, Time (Minutes): 30, Frequency (Times/Week): 3, Weekly Exercise (Minutes/Week): 90, Intensity: Mild, Exercise limited by: None identified   Goals Addressed   None    Depression Screen    06/17/2022    9:18 AM  06/17/2022    9:15 AM 05/14/2022   11:27 AM 04/29/2022   11:53 AM 03/18/2022    8:04 AM 01/06/2022    3:07 PM 12/15/2021   11:20 AM  PHQ 2/9 Scores  PHQ - 2 Score 0 0 4 4 4  0 2  PHQ- 9 Score   15 18 13  0 9    Fall Risk    06/17/2022    9:18 AM 04/29/2022   11:53 AM 03/26/2022   11:23 AM 03/18/2022    8:03 AM 01/06/2022    3:06 PM  Fall Risk   Falls in the past year? 0 0 0 0 0  Number falls in past yr: 0 0 0 0 0  Injury with Fall? 0 0 0 0 0  Risk for fall due to :  No Fall Risks No Fall Risks No Fall Risks No Fall Risks  Follow up Falls evaluation completed;Education provided Falls evaluation completed Falls evaluation completed Falls evaluation completed Falls evaluation completed    FALL RISK PREVENTION PERTAINING TO THE HOME:  Any stairs in or around the home? No  If so, are there any without handrails? No  Home free of loose throw rugs in walkways, pet beds, electrical cords, etc? Yes  Adequate lighting in your home to reduce risk of falls? Yes   ASSISTIVE DEVICES UTILIZED TO PREVENT FALLS:  Life alert? No  Use of a cane, walker or w/c? No  Grab bars in the bathroom? No  Shower chair or bench in shower? No  Elevated toilet seat or a handicapped toilet? No   Cognitive Function:    Normal cognitive status assessed by telephone conversation  by this Nurse Health Advisor. No abnormalities found.      Immunizations Immunization History  Administered Date(s) Administered   Fluad Quad(high Dose 65+) 09/11/2021   Influenza Split 08/19/2012   Influenza, Seasonal, Injecte, Preservative Fre 01/10/2014, 11/14/2014   Influenza,inj,Quad PF,6+ Mos 01/10/2014, 11/14/2014, 11/15/2017, 12/08/2018, 10/18/2019   PFIZER(Purple Top)SARS-COV-2 Vaccination 12/25/2019, 01/15/2020, 12/04/2020   Pneumococcal Polysaccharide-23 08/19/2012, 11/15/2017   Tdap 01/10/2014    TDAP status: Up to date  Flu Vaccine status: Up to date  Pneumococcal vaccine status: Up to date  Covid-19 vaccine  status: Completed vaccines  Qualifies for Shingles Vaccine? Yes   Zostavax completed No   Shingrix Completed?: Yes  Screening Tests Health Maintenance  Topic Date Due   OPHTHALMOLOGY EXAM  10/30/2018   COVID-19 Vaccine (4 - Pfizer series) 01/29/2021   FOOT EXAM  05/05/2022   HEMOGLOBIN A1C  06/02/2022   Zoster Vaccines- Shingrix (1 of 2) 08/14/2022 (Originally 12/11/2004)   COLONOSCOPY (Pts 45-70yrs Insurance coverage will need to be confirmed)  12/03/2022 (Originally 12/12/1999)   Pneumonia Vaccine 2+ Years old (2 - PCV) 05/15/2023 (Originally 11/15/2018)   MAMMOGRAM  05/15/2023 (Originally 04/17/2021)   DEXA SCAN  05/30/2023 (Originally 12/12/2019)   INFLUENZA VACCINE  06/30/2022   TETANUS/TDAP  01/11/2024   Hepatitis C Screening  Completed   HPV VACCINES  Aged Out    Health Maintenance  Health Maintenance Due  Topic Date Due   OPHTHALMOLOGY EXAM  10/30/2018   COVID-19 Vaccine (4 - Pfizer series) 01/29/2021   FOOT EXAM  05/05/2022   HEMOGLOBIN A1C  06/02/2022    Colorectal cancer screening: Referral to GI placed deferred at this time per patient . Pt aware the office will call re: appt.  Mammogram status: Ordered 06/17/2022. Pt provided with contact info and advised to call to schedule appt.   Bone Density status: Ordered 06/17/2022. Pt provided with contact info and advised to call to schedule appt.  Lung Cancer Screening: (Low Dose CT Chest recommended if Age 37-80 years, 30 pack-year currently smoking OR have quit w/in 15years.) does not qualify.   Lung Cancer Screening Referral: n/a  Additional Screening:  Hepatitis C Screening: does not qualify;  Vision Screening: Recommended annual ophthalmology exams for early detection of glaucoma and other disorders of the eye. Is the patient up to date with their annual eye exam?  Yes  Who is the provider or what is the name of the office in which the patient attends annual eye exams? Dr. Bing Plume  If pt is not established  with a provider, would they like to be referred to a provider to establish care? No .   Dental Screening: Recommended annual dental exams for proper oral hygiene  Community Resource Referral / Chronic Care Management: CRR required this visit?  No   CCM required this visit?  No      Plan:     I have personally reviewed and noted the following in the patient's chart:   Medical and social history Use of alcohol, tobacco or illicit drugs  Current medications and supplements including opioid prescriptions. Patient is not currently taking opioid prescriptions. Functional ability and status Nutritional status Physical activity Advanced directives List of other physicians  Hospitalizations, surgeries, and ER visits in previous 12 months Vitals Screenings to include cognitive, depression, and falls Referrals and appointments  In addition, I have reviewed and discussed with patient certain preventive protocols, quality metrics, and best practice recommendations. A written personalized care plan for preventive services as well as general preventive health recommendations were provided to patient.     March Rummage, LPN   [id number]   Nurse Notes: none

## 2022-06-19 ENCOUNTER — Ambulatory Visit: Payer: Medicare Other | Admitting: Internal Medicine

## 2022-06-19 NOTE — Progress Notes (Deleted)
Name: Felicia Hanson  MRN/ DOB: [id number], 02/01/1956   Age/ Sex: 66 y.o., female    PCP: Shade Flood, MD   Reason for Endocrinology Evaluation: Type {NUMBERS 1 OR 2:522190} Diabetes Mellitus     Date of Initial Endocrinology Visit: 06/19/2022     PATIENT IDENTIFIER: Felicia Hanson is a 66 y.o. female with a past medical history of HTN, CHF, emphysema, T2DM, benign essential tremors and dyslipidemia. The patient presented for initial endocrinology clinic visit on 06/19/2022 for consultative assistance with her diabetes management.    HPI: Felicia Hanson was    Diagnosed with DM *** Prior Medications tried/Intolerance: *** Currently checking blood sugars *** x / day,  before breakfast and ***.  Hypoglycemia episodes : ***               Symptoms: ***                 Frequency: ***/  Hemoglobin A1c has ranged from 6.7% in 2023, peaking at 13.1% in 2022. Patient required assistance for hypoglycemia:  Patient has required hospitalization within the last 1 year from hyper or hypoglycemia:   In terms of diet, the patient ***   HOME DIABETES REGIMEN: Farxiga 10 mg daily Trulicity 1.5 mg weekly Lantus  Statin: Yes ACE-I/ARB: yes   METER DOWNLOAD SUMMARY: Date range evaluated: *** Fingerstick Blood Glucose Tests = *** Average Number Tests/Day = *** Overall Mean FS Glucose = *** Standard Deviation = ***  BG Ranges: Low = *** High = ***   Hypoglycemic Events/30 Days: BG < 50 = *** Episodes of symptomatic severe hypoglycemia = ***   DIABETIC COMPLICATIONS: Microvascular complications:  CKD III Denies: *** Last eye exam: Completed   Macrovascular complications:  CHF Denies: CAD, PVD, CVA   PAST HISTORY: Past Medical History:  Past Medical History:  Diagnosis Date   Allergy    Anxiety    CHF (congestive heart failure) (HCC) 01/20/2021   COPD (chronic obstructive pulmonary disease) (HCC)    Coronary artery calcification    Depression    Diabetes mellitus  without complication (HCC)    Diverticulitis    with colon resection   GERD (gastroesophageal reflux disease)    on pepcid   Hyperlipidemia    Hypertension    Sleep apnea    cpap on order 01-01-22   Sleep paralysis    will be tested for narcolepsy per pt 01-01-22   Thyroid disease    Past Surgical History:  Past Surgical History:  Procedure Laterality Date   BREAST EXCISIONAL BIOPSY Left    CESAREAN SECTION     x2   CHOLECYSTECTOMY     COLON SURGERY     diverticulitis with perforation; s/p colon resection.   Colonoscopy     COLONOSCOPY     DENTAL SURGERY     RIGHT/LEFT HEART CATH AND CORONARY ANGIOGRAPHY N/A 01/22/2021   Procedure: RIGHT/LEFT HEART CATH AND CORONARY ANGIOGRAPHY;  Surgeon: Yates Decamp, MD;  Location: [facility];  Service: Cardiovascular;  Laterality: N/A;   TUBAL LIGATION      Social History:  reports that she has been smoking cigarettes. She has a 9.50 pack-year smoking history. She has never used smokeless tobacco. She reports that she does not drink alcohol and does not use drugs. Family History:  Family History  Problem Relation Age of Onset   Colon polyps Mother    Cancer Mother        unknown primary  Hyperlipidemia Mother    Other Father    Breast cancer Paternal Aunt    Esophageal cancer Paternal Uncle    Colon cancer Neg Hx    Rectal cancer Neg Hx    Stomach cancer Neg Hx      HOME MEDICATIONS: Allergies as of 06/19/2022       Reactions   Metformin And Related Other (See Comments)   Could not swallow   Nicotine Rash   Only allergic to nicotine patch   Ace Inhibitors Cough   Cough with lisinopril.         Medication List        Accurate as of June 19, 2022 10:58 AM. If you have any questions, ask your nurse or doctor.          Accu-Chek Aviva Plus test strip Generic drug: glucose blood daily. for testing   albuterol 108 (90 Base) MCG/ACT inhaler Commonly known as: VENTOLIN HFA Inhale 1 puff into the lungs every 6  (six) hours as needed for wheezing or shortness of breath.   ALPRAZolam 1 MG tablet Commonly known as: XANAX Take 0.5 mg by mouth 3 (three) times daily as needed for anxiety or sleep.   aspirin 81 MG tablet Take 81 mg by mouth daily.   Azelastine HCl 137 MCG/SPRAY Soln PLACE 1 SPRAY INTO BOTH NOSTRILS 2 (TWO) TIMES DAILY. USE IN EACH NOSTRIL AS DIRECTED IF RUNNY NOSE/DRAINAGE.   BLACK ELDERBERRY PO Take 10 mLs by mouth daily.   cetirizine 10 MG tablet Commonly known as: ZYRTEC Take 10 mg by mouth daily.   dapagliflozin propanediol 10 MG Tabs tablet Commonly known as: FARXIGA Take 1 tablet (10 mg total) by mouth daily.   Dexcom G6 Receiver Devi 1 Device by Does not apply route every morning. Device and supplies   DULoxetine 60 MG capsule Commonly known as: CYMBALTA Take 2 capsules (120 mg total) by mouth daily.   ezetimibe 10 MG tablet Commonly known as: ZETIA TAKE 1 TABLET BY MOUTH EVERY DAY   famotidine 20 MG tablet Commonly known as: PEPCID Take 20 mg by mouth daily as needed for heartburn or indigestion.   fluticasone 44 MCG/ACT inhaler Commonly known as: Flovent HFA Inhale 1-2 puffs into the lungs in the morning and at bedtime.   fluticasone 50 MCG/ACT nasal spray Commonly known as: FLONASE Place 2 sprays into both nostrils daily.   Insulin Pen Needle 31G X 5 MM Misc Inject 1 each into the skin daily.   Lantus SoloStar 100 UNIT/ML Solostar Pen Generic drug: insulin glargine Inject 54 Units into the skin at bedtime.   levothyroxine 88 MCG tablet Commonly known as: SYNTHROID TAKE 1 TABLET BY MOUTH EVERY DAY BEFORE BREAKFAST   magnesium oxide 400 (240 Mg) MG tablet Commonly known as: MAG-OX TAKE 0.5 TABLETS (200 MG TOTAL) BY MOUTH 2 TIMES DAILY.   metoprolol succinate 100 MG 24 hr tablet Commonly known as: TOPROL-XL TAKE 1 TABLET BY MOUTH DAILY. TAKE WITH OR IMMEDIATELY FOLLOWING A MEAL.   nitroGLYCERIN 0.4 MG SL tablet Commonly known as:  NITROSTAT PLACE 1 TABLET UNDER THE TONGUE EVERY 5 MINUTES AS NEEDED FOR CHEST PAIN. IF YOU REQUIRE MORE THAN TWO TABLETS FIVE MINUTES APART GO TO THE NEAREST ER VIA EMS. What changed: See the new instructions.   omeprazole 20 MG capsule Commonly known as: PRILOSEC TAKE 1 CAPSULE BY MOUTH EVERY DAY   ondansetron 4 MG disintegrating tablet Commonly known as: ZOFRAN-ODT Take 1 tablet (4 mg total) by  mouth every 8 (eight) hours as needed.   pravastatin 80 MG tablet Commonly known as: PRAVACHOL TAKE 1 TABLET BY MOUTH EVERY EVENING   predniSONE 20 MG tablet Commonly known as: DELTASONE Take 2 tablets (40 mg total) by mouth daily with breakfast.   promethazine-dextromethorphan 6.25-15 MG/5ML syrup Commonly known as: PROMETHAZINE-DM Take 5 mLs by mouth 4 (four) times daily as needed for cough.   sacubitril-valsartan 97-103 MG Commonly known as: ENTRESTO Take 1 tablet by mouth 2 (two) times daily.   spironolactone 25 MG tablet Commonly known as: ALDACTONE TAKE 1 TABLET BY MOUTH EVERY DAY   topiramate 50 MG tablet Commonly known as: Topamax Take 1 tablet (50 mg total) by mouth 2 (two) times daily.   Trulicity 1.5 MG/0.5ML Sopn Generic drug: Dulaglutide INJECT 1.5 MG (0.5ML) UNDER THE SKIN ONCE A WEEK   Vitamin D (Ergocalciferol) 1.25 MG (50000 UNIT) Caps capsule Commonly known as: DRISDOL TAKE 1 CAPSULE (50,000 UNITS TOTAL) BY MOUTH EVERY 7 (SEVEN) DAYS         ALLERGIES: Allergies  Allergen Reactions   Metformin And Related Other (See Comments)    Could not swallow   Nicotine Rash    Only allergic to nicotine patch   Ace Inhibitors Cough    Cough with lisinopril.      REVIEW OF SYSTEMS: A comprehensive ROS was conducted with the patient and is negative except as per HPI and below:  ROS    OBJECTIVE:   VITAL SIGNS: There were no vitals taken for this visit.   PHYSICAL EXAM:  General: Pt appears well and is in NAD  Hydration: Well-hydrated with moist  mucous membranes and good skin turgor  HEENT: Head: Unremarkable with good dentition. Oropharynx clear without exudate.  Eyes: External eye exam normal without stare, lid lag or exophthalmos.  EOM intact.  PERRL.  Neck: General: Supple without adenopathy or carotid bruits. Thyroid: Thyroid size normal.  No goiter or nodules appreciated. No thyroid bruit.  Lungs: Clear with good BS bilat with no rales, rhonchi, or wheezes  Heart: RRR with normal S1 and S2 and no gallops; no murmurs; no rub  Abdomen: Normoactive bowel sounds, soft, nontender, without masses or organomegaly palpable  Extremities:  Lower extremities - No pretibial edema. No lesions.  Skin: Normal texture and temperature to palpation. No rash noted. No Acanthosis nigricans/skin tags. No lipohypertrophy.  Neuro: MS is good with appropriate affect, pt is alert and Ox3    DM foot exam:    DATA REVIEWED:  Lab Results  Component Value Date   HGBA1C 6.7 (H) 12/03/2021   HGBA1C 13.1 (A) 09/11/2021   HGBA1C 11.9 (H) 05/05/2021    Latest Reference Range & Units 05/01/22 15:16  Sodium 135 - 145 mEq/L 138  Potassium 3.5 - 5.1 mEq/L 4.4  Chloride 96 - 112 mEq/L 102  CO2 19 - 32 mEq/L 29  Glucose 70 - 99 mg/dL 789 (H)  BUN 6 - 23 mg/dL 14  Creatinine 3.81 - 0.17 mg/dL 5.10 (H)  Calcium 8.4 - 10.5 mg/dL 9.9  GFR >25.85 mL/min 38.63 (L)  (H): Data is abnormally high (L): Data is abnormally low Lab Results  Component Value Date   MICROALBUR 19.2 (H) 05/21/2021   LDLCALC 54 12/03/2021   CREATININE 1.41 (H) 05/01/2022   Lab Results  Component Value Date   MICRALBCREAT 58.9 (H) 05/21/2021    Lab Results  Component Value Date   CHOL 122 12/03/2021   HDL 39.30 12/03/2021   LDLCALC  54 12/03/2021   TRIG 143.0 12/03/2021   CHOLHDL 3 12/03/2021        ASSESSMENT / PLAN / RECOMMENDATIONS:   1) Type 2 Diabetes Mellitus, ***controlled, With CKD III complications - Most recent A1c of *** %. Goal A1c < 7.0 %.     Plan: GENERAL: ***  MEDICATIONS: ***  EDUCATION / INSTRUCTIONS: BG monitoring instructions: Patient is instructed to check her blood sugars *** times a day, ***. Call [facility] if: BG persistently < 70 or > 300. I reviewed the Rule of 15 for the treatment of hypoglycemia in detail with the patient. Literature supplied.   2) Diabetic complications:  Eye: Does *** have known diabetic retinopathy.  Neuro/ Feet: Does *** have known diabetic peripheral neuropathy. Renal: Patient does  have known baseline CKD. She is *** on an ACEI/ARB at present.  3) Lipids: Patient is *** on a statin.    4) Hypertension: ***  at goal of < 140/90 mmHg.       Signed electronically by: Lyndle Herrlich, MD  [facility]  [facility] [address] [place] [place], [place] [address] Phone: [phone] FAX: [phone]   CC: Shade Flood, MD [address] [place] [address] Phone: [phone]  Fax: [phone]    Return to Endocrinology clinic as below: Future Appointments  Date Time Provider Department Center  06/19/2022  1:20 PM Avyanna Spada, Konrad Dolores, MD [facility] None  07/02/2022  2:00 PM Shade Flood, MD [facility]  09/08/2022  2:00 PM Tessa Lerner, DO [facility] None

## 2022-06-25 ENCOUNTER — Ambulatory Visit: Payer: Medicare Other

## 2022-06-27 ENCOUNTER — Other Ambulatory Visit: Payer: Self-pay | Admitting: Neurology

## 2022-07-02 ENCOUNTER — Ambulatory Visit: Payer: Medicare Other | Admitting: Family Medicine

## 2022-07-02 ENCOUNTER — Other Ambulatory Visit: Payer: Self-pay | Admitting: Neurology

## 2022-07-08 ENCOUNTER — Other Ambulatory Visit: Payer: Self-pay | Admitting: Neurology

## 2022-07-10 ENCOUNTER — Other Ambulatory Visit: Payer: Self-pay | Admitting: Family Medicine

## 2022-07-10 ENCOUNTER — Other Ambulatory Visit: Payer: Self-pay

## 2022-07-10 ENCOUNTER — Telehealth: Payer: Self-pay | Admitting: Family Medicine

## 2022-07-10 DIAGNOSIS — E1165 Type 2 diabetes mellitus with hyperglycemia: Secondary | ICD-10-CM

## 2022-07-10 MED ORDER — TOPIRAMATE 50 MG PO TABS
50.0000 mg | ORAL_TABLET | Freq: Two times a day (BID) | ORAL | 1 refills | Status: DC
Start: 2022-07-10 — End: 2022-08-05

## 2022-07-10 NOTE — Telephone Encounter (Signed)
Encourage patient to contact the pharmacy for refills or they can request refills through [facility]  (Please schedule appointment if patient has not been seen in over a year)    WHAT PHARMACY WOULD THEY LIKE THIS SENT TO: [facility] [place] [phone]  MEDICATION NAME & DOSE: topiramate 50 mg  NOTES/COMMENTS FROM PATIENT: Dr. Vickey Huger rx'd and refilled twice and instructed pt to call our office for future refills.      Front office please notify patient: It takes 48-72 hours to process rx refill requests Ask patient to call pharmacy to ensure rx is ready before heading there.

## 2022-07-10 NOTE — Telephone Encounter (Signed)
Refill sent in

## 2022-07-15 ENCOUNTER — Inpatient Hospital Stay: Admission: RE | Admit: 2022-07-15 | Payer: Medicare Other | Source: Ambulatory Visit

## 2022-07-18 ENCOUNTER — Other Ambulatory Visit: Payer: Self-pay | Admitting: Family Medicine

## 2022-07-27 ENCOUNTER — Other Ambulatory Visit: Payer: Self-pay | Admitting: Family Medicine

## 2022-07-27 DIAGNOSIS — E1165 Type 2 diabetes mellitus with hyperglycemia: Secondary | ICD-10-CM

## 2022-08-04 ENCOUNTER — Telehealth (HOSPITAL_COMMUNITY): Payer: Self-pay | Admitting: Psychiatry

## 2022-08-04 ENCOUNTER — Other Ambulatory Visit: Payer: Self-pay | Admitting: Family Medicine

## 2022-08-04 DIAGNOSIS — E1165 Type 2 diabetes mellitus with hyperglycemia: Secondary | ICD-10-CM

## 2022-08-05 ENCOUNTER — Ambulatory Visit (INDEPENDENT_AMBULATORY_CARE_PROVIDER_SITE_OTHER): Payer: Medicare Other | Admitting: Family Medicine

## 2022-08-05 ENCOUNTER — Other Ambulatory Visit: Payer: Self-pay

## 2022-08-05 VITALS — BP 130/68 | HR 82 | Temp 98.0°F | Resp 18 | Ht 60.0 in | Wt 155.2 lb

## 2022-08-05 DIAGNOSIS — J3489 Other specified disorders of nose and nasal sinuses: Secondary | ICD-10-CM

## 2022-08-05 DIAGNOSIS — G250 Essential tremor: Secondary | ICD-10-CM

## 2022-08-05 DIAGNOSIS — E034 Atrophy of thyroid (acquired): Secondary | ICD-10-CM

## 2022-08-05 DIAGNOSIS — Z23 Encounter for immunization: Secondary | ICD-10-CM | POA: Diagnosis not present

## 2022-08-05 DIAGNOSIS — E1165 Type 2 diabetes mellitus with hyperglycemia: Secondary | ICD-10-CM

## 2022-08-05 DIAGNOSIS — I5043 Acute on chronic combined systolic (congestive) and diastolic (congestive) heart failure: Secondary | ICD-10-CM | POA: Diagnosis not present

## 2022-08-05 DIAGNOSIS — Z6832 Body mass index (BMI) 32.0-32.9, adult: Secondary | ICD-10-CM

## 2022-08-05 DIAGNOSIS — E661 Drug-induced obesity: Secondary | ICD-10-CM

## 2022-08-05 DIAGNOSIS — F418 Other specified anxiety disorders: Secondary | ICD-10-CM

## 2022-08-05 LAB — LIPID PANEL
Cholesterol: 132 mg/dL (ref 0–200)
HDL: 37.6 mg/dL — ABNORMAL LOW (ref >39.00)
NonHDL: 94.16
Total CHOL/HDL Ratio: 4
Triglycerides: 228 mg/dL — ABNORMAL HIGH (ref 0.0–149.0)
VLDL: 45.6 mg/dL — ABNORMAL HIGH (ref 0.0–40.0)

## 2022-08-05 LAB — COMPREHENSIVE METABOLIC PANEL
ALT: 11 U/L (ref 0–35)
AST: 18 U/L (ref 0–37)
Albumin: 3.7 g/dL (ref 3.5–5.2)
Alkaline Phosphatase: 68 U/L (ref 39–117)
BUN: 13 mg/dL (ref 6–23)
CO2: 27 mEq/L (ref 19–32)
Calcium: 9.8 mg/dL (ref 8.4–10.5)
Chloride: 104 mEq/L (ref 96–112)
Creatinine, Ser: 1.37 mg/dL — ABNORMAL HIGH (ref 0.40–1.20)
GFR: 39.92 mL/min — ABNORMAL LOW (ref >60.00)
Glucose, Bld: 114 mg/dL — ABNORMAL HIGH (ref 70–99)
Potassium: 4.5 mEq/L (ref 3.5–5.1)
Sodium: 141 mEq/L (ref 135–145)
Total Bilirubin: 0.4 mg/dL (ref 0.2–1.2)
Total Protein: 7.3 g/dL (ref 6.0–8.3)

## 2022-08-05 LAB — HEMOGLOBIN A1C: Hgb A1c MFr Bld: 5.9 % (ref 4.6–6.5)

## 2022-08-05 LAB — LDL CHOLESTEROL, DIRECT: Direct LDL: 69 mg/dL

## 2022-08-05 LAB — TSH: TSH: 0.03 u[IU]/mL — ABNORMAL LOW (ref 0.35–5.50)

## 2022-08-05 MED ORDER — TRULICITY 1.5 MG/0.5ML ~~LOC~~ SOAJ: SUBCUTANEOUS | 1 refills | Status: DC

## 2022-08-05 MED ORDER — EZETIMIBE 10 MG PO TABS: 10.0000 mg | ORAL_TABLET | Freq: Every day | ORAL | 0 refills | Status: DC

## 2022-08-05 MED ORDER — TOPIRAMATE 50 MG PO TABS: 50.0000 mg | ORAL_TABLET | Freq: Two times a day (BID) | ORAL | 1 refills | Status: DC

## 2022-08-05 MED ORDER — LEVOTHYROXINE SODIUM 88 MCG PO TABS: ORAL_TABLET | ORAL | 1 refills | Status: DC

## 2022-08-05 NOTE — Progress Notes (Signed)
Subjective:  Patient ID: [name], female    DOB: March 26, 1956  Age: 66 y.o. MRN: [id number]  CC:  Chief Complaint  Patient presents with   Follow-up    Patient states she is here for a medication review and labs.    HPI Taralee DALONDA SIMONI presents for   Diabetes: Complicated by hyperglycemia, microalbuminuria.  Improved on most recent A1c in January, has not had testing since that time.  Treated with Lantus 54 units nightly, Trulicity 1.5 mg weekly at that visit. Farxiga 10mg  qd. Still on same doses.  On pravastatin and Zetia for lipids.  ARB with entresto (hx of CHF). Doing ok off lasix.  Fasting 106-125.  Postprandial: 160 No symptomatic lows.  Microalbumin: 19.2 on 05/21/21.  Optho visit few months ago. 05/23/21.  Less exercise with hot weather. No new med side effects. Few episodes of temporary confusion - got into passenger seat, forgot she was at store alone.  No daily confusion, no impact on day-day activities. Plans to discuss memory testing.  On topiramate for essential tremor.  Prescribed from sleep specialist in June. Plan for further refills from me. Working well - would like to continue.   Lab Results  Component Value Date   HGBA1C 6.7 (H) 12/03/2021   HGBA1C 13.1 (A) 09/11/2021   HGBA1C 11.9 (H) 05/05/2021   Lab Results  Component Value Date   MICROALBUR 19.2 (H) 05/21/2021   LDLCALC 54 12/03/2021   CREATININE 1.41 (H) 05/01/2022   Followed by psychiatry for depression.   Rhinitis: Using zyrtec for allergies, not needing nasal sprays.   Hypothyroidism: Lab Results  Component Value Date   TSH 0.22 (L) 12/03/2021  Tsh repeat with free t4 6.39, 0.7 on 1/16. Continued same dose of meds with planned recheck of tsh.  Taking medication daily. Synthroid 2/16 qd. Some cold intolerance, dry hair. No heart palpitations or new fatigue. No new weight changes - minimal weight loss with trying to eat better.  Wt Readings from Last 3 Encounters:   08/05/22 155 lb 3.2 oz (70.4 kg)  05/25/22 162 lb (73.5 kg)  05/14/22 162 lb (73.5 kg)    Also plans on follow up for left ear sound at times with feeling off balance at times. No focal weakness, slurred speech or new HA.   On topiramate for   History Patient Active Problem List   Diagnosis Date Noted   Benign essential tremor 05/25/2022   Shortness of breath at rest 05/09/2021   Loud snoring 03/24/2021   Excessive daytime sleepiness 03/24/2021   Acute on chronic combined systolic and diastolic HF (heart failure) (HCC) 03/24/2021   Panlobular emphysema (HCC) 03/24/2021   GAD (generalized anxiety disorder) 03/24/2021   Atypical chest pain 01/22/2021   Respiratory distress 01/20/2021   Acute on chronic combined systolic and diastolic CHF (congestive heart failure) (HCC) 01/20/2021   Type 2 diabetes mellitus without complication, without long-term current use of insulin (HCC) 10/26/2017   Generalized anxiety disorder 01/10/2014   Vitamin D deficiency 01/10/2014   Essential hypertension, benign 01/10/2014   Pure hypercholesterolemia 01/10/2014   Hypothyroidism 01/10/2014   Past Medical History:  Diagnosis Date   Allergy    Anxiety    CHF (congestive heart failure) (HCC) 01/20/2021   COPD (chronic obstructive pulmonary disease) (HCC)    Coronary artery calcification    Depression    Diabetes mellitus without complication (HCC)    Diverticulitis    with colon resection   GERD (gastroesophageal reflux  disease)    on pepcid   Hyperlipidemia    Hypertension    Sleep apnea    cpap on order 01-01-22   Sleep paralysis    will be tested for narcolepsy per pt 01-01-22   Thyroid disease    Past Surgical History:  Procedure Laterality Date   BREAST EXCISIONAL BIOPSY Left    CESAREAN SECTION     x2   CHOLECYSTECTOMY     COLON SURGERY     diverticulitis with perforation; s/p colon resection.   Colonoscopy     COLONOSCOPY     DENTAL SURGERY     RIGHT/LEFT HEART CATH AND  CORONARY ANGIOGRAPHY N/A 01/22/2021   Procedure: RIGHT/LEFT HEART CATH AND CORONARY ANGIOGRAPHY;  Surgeon: Yates Decamp, MD;  Location: [facility];  Service: Cardiovascular;  Laterality: N/A;   TUBAL LIGATION     Allergies  Allergen Reactions   Metformin And Related Other (See Comments)    Could not swallow   Nicotine Rash    Only allergic to nicotine patch   Ace Inhibitors Cough    Cough with lisinopril.    Prior to Admission medications   Medication Sig Start Date End Date Taking? Authorizing Provider  ACCU-CHEK AVIVA PLUS test strip daily. for testing 03/11/18  Yes [provider]  albuterol (VENTOLIN HFA) 108 (90 Base) MCG/ACT inhaler Inhale 1 puff into the lungs every 6 (six) hours as needed for wheezing or shortness of breath.   Yes [provider]  ALPRAZolam Prudy Feeler) 1 MG tablet Take 0.5 mg by mouth 3 (three) times daily as needed for anxiety or sleep.  08/21/13  Yes [provider]  aspirin 81 MG tablet Take 81 mg by mouth daily.   Yes [provider]  Azelastine HCl 137 MCG/SPRAY SOLN PLACE 1 SPRAY INTO BOTH NOSTRILS 2 (TWO) TIMES DAILY. USE IN EACH NOSTRIL AS DIRECTED IF RUNNY NOSE/DRAINAGE. 05/26/22  Yes Shade Flood, MD  BLACK ELDERBERRY PO Take 10 mLs by mouth daily.   Yes [provider]  cetirizine (ZYRTEC) 10 MG tablet Take 10 mg by mouth daily.   Yes [provider]  dapagliflozin propanediol (FARXIGA) 10 MG TABS tablet Take 1 tablet (10 mg total) by mouth daily. 04/30/22  Yes Tolia, Sunit, DO  Dulaglutide (TRULICITY) 1.5 MG/0.5ML SOPN INJECT 1.5 MG (0.5ML) UNDER THE SKIN ONCE A WEEK 05/29/22  Yes Shade Flood, MD  DULoxetine (CYMBALTA) 60 MG capsule Take 2 capsules (120 mg total) by mouth daily. 05/14/22  Yes Shade Flood, MD  ezetimibe (ZETIA) 10 MG tablet TAKE 1 TABLET BY MOUTH EVERY DAY 04/06/22  Yes Shade Flood, MD  famotidine (PEPCID) 20 MG tablet Take 20 mg by mouth daily as needed for heartburn  or indigestion.   Yes [provider]  fluticasone (FLONASE) 50 MCG/ACT nasal spray Place 2 sprays into both nostrils daily. 03/15/22  Yes Leath-Warren, Sadie Haber, NP  fluticasone (FLOVENT HFA) 44 MCG/ACT inhaler Inhale 1-2 puffs into the lungs in the morning and at bedtime. 05/14/22  Yes Shade Flood, MD  Insulin Pen Needle 31G X 5 MM MISC Inject 1 each into the skin daily. 02/10/21  Yes Shade Flood, MD  LANTUS SOLOSTAR 100 UNIT/ML Solostar Pen INJECT 54 UNITS INTO THE SKIN AT BEDTIME. 08/04/22  Yes Shade Flood, MD  levothyroxine (SYNTHROID) 88 MCG tablet TAKE 1 TABLET BY MOUTH EVERY DAY BEFORE BREAKFAST 01/07/22  Yes Shade Flood, MD  magnesium oxide (MAG-OX) 400 (  240 Mg) MG tablet TAKE 0.5 TABLETS (200 MG TOTAL) BY MOUTH 2 TIMES DAILY. 01/06/22  Yes Tolia, Sunit, DO  metoprolol succinate (TOPROL-XL) 100 MG 24 hr tablet TAKE 1 TABLET BY MOUTH DAILY. TAKE WITH OR IMMEDIATELY FOLLOWING A MEAL. 12/29/21  Yes Tolia, Sunit, DO  nitroGLYCERIN (NITROSTAT) 0.4 MG SL tablet PLACE 1 TABLET UNDER THE TONGUE EVERY 5 MINUTES AS NEEDED FOR CHEST PAIN. IF YOU REQUIRE MORE THAN TWO TABLETS FIVE MINUTES APART GO TO THE NEAREST ER VIA EMS. Patient taking differently: Place 0.4 mg under the tongue every 5 (five) minutes as needed for chest pain. 07/29/20  Yes Tolia, Sunit, DO  omeprazole (PRILOSEC) 20 MG capsule TAKE 1 CAPSULE BY MOUTH EVERY DAY 06/14/22  Yes Shade Flood, MD  ondansetron (ZOFRAN-ODT) 4 MG disintegrating tablet Take 1 tablet (4 mg total) by mouth every 8 (eight) hours as needed. 02/01/22  Yes Tomi Bamberger, PA-C  pravastatin (PRAVACHOL) 80 MG tablet TAKE 1 TABLET BY MOUTH EVERY EVENING 11/29/21  Yes Shade Flood, MD  predniSONE (DELTASONE) 20 MG tablet Take 2 tablets (40 mg total) by mouth daily with breakfast. 03/26/22  Yes Shade Flood, MD  promethazine-dextromethorphan (PROMETHAZINE-DM) 6.25-15 MG/5ML syrup Take 5 mLs by mouth 4 (four) times daily as needed for  cough. 03/15/22  Yes Leath-Warren, Sadie Haber, NP  sacubitril-valsartan (ENTRESTO) 97-103 MG Take 1 tablet by mouth 2 (two) times daily. 02/05/22  Yes Tolia, Sunit, DO  spironolactone (ALDACTONE) 25 MG tablet TAKE 1 TABLET BY MOUTH EVERY DAY 03/24/22  Yes Shade Flood, MD  topiramate (TOPAMAX) 50 MG tablet Take 1 tablet (50 mg total) by mouth 2 (two) times daily. 07/10/22  Yes Shade Flood, MD  Vitamin D, Ergocalciferol, (DRISDOL) 1.25 MG (50000 UNIT) CAPS capsule TAKE 1 CAPSULE (50,000 UNITS TOTAL) BY MOUTH EVERY 7 (SEVEN) DAYS 03/24/22  Yes Shade Flood, MD  Continuous Blood Gluc Receiver (DEXCOM G6 RECEIVER) DEVI 1 Device by Does not apply route every morning. Device and supplies Patient not taking: Reported on 06/17/2022 04/18/20   Shade Flood, MD   Social History   Socioeconomic History   Marital status: Married    Spouse name: Not on file   Number of children: 2   Years of education: some college   Highest education level: Not on file  Occupational History   Occupation: Retired  Tobacco Use   Smoking status: Every Day    Packs/day: 0.25    Years: 38.00    Total pack years: 9.50    Types: Cigarettes   Smokeless tobacco: Never   Tobacco comments:    2-5 A DAY  Vaping Use   Vaping Use: Never used  Substance and Sexual Activity   Alcohol use: No    Alcohol/week: 0.0 standard drinks of alcohol   Drug use: No   Sexual activity: Not Currently  Other Topics Concern   Not on file  Social History Narrative   Marital status: married      Children:  2 children; 4 grandchildren      Lives: with husband, 2 granddaughters      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   Left-handed.   Caffeine use: 2.5 cups caffeine per day.   Social Determinants of Health   Financial Resource Strain: Low Risk  (06/17/2022)   Overall Financial Resource Strain (CARDIA)    Difficulty  of Paying Living Expenses: Not hard at all   Food Insecurity: No Food Insecurity (06/17/2022)   Hunger Vital Sign    Worried About Running Out of Food in the Last Year: Never true    Ran Out of Food in the Last Year: Never true  Transportation Needs: No Transportation Needs (06/17/2022)   PRAPARE - Administrator, Civil Service (Medical): No    Lack of Transportation (Non-Medical): No  Physical Activity: Insufficiently Active (06/17/2022)   Exercise Vital Sign    Days of Exercise per Week: 3 days    Minutes of Exercise per Session: 30 min  Stress: No Stress Concern Present (06/17/2022)   [employer] of Occupational Health - Occupational Stress Questionnaire    Feeling of Stress : Not at all  Social Connections: Moderately Integrated (06/17/2022)   Social Connection and Isolation Panel [NHANES]    Frequency of Communication with Friends and Family: Three times a week    Frequency of Social Gatherings with Friends and Family: Three times a week    Attends Religious Services: More than 4 times per year    Active Member of Clubs or Organizations: No    Attends Banker Meetings: Never    Marital Status: Married  Catering manager Violence: Not At Risk (06/17/2022)   Humiliation, Afraid, Rape, and Kick questionnaire    Fear of Current or Ex-Partner: No    Emotionally Abused: No    Physically Abused: No    Sexually Abused: No    Review of Systems  Constitutional:  Negative for fatigue and unexpected weight change.  Respiratory:  Negative for chest tightness and shortness of breath.   Cardiovascular:  Negative for chest pain, palpitations and leg swelling.  Gastrointestinal:  Negative for abdominal pain and blood in stool.  Neurological:  Negative for dizziness (only when left ear flares.), syncope, light-headedness and headaches.     Objective:   Vitals:   08/05/22 1312  BP: 130/68  Pulse: 82  Resp: 18  Temp: 98 F (36.7 C)  TempSrc: Temporal  SpO2: 98%  Weight: 155 lb 3.2 oz (70.4 kg)   Height: 5' (1.524 m)     Physical Exam Vitals reviewed.  Constitutional:      Appearance: Normal appearance. She is well-developed.  HENT:     Head: Normocephalic and atraumatic.     Right Ear: Tympanic membrane and ear canal normal.     Left Ear: Tympanic membrane and ear canal normal.     Ears:     Comments: External canals clear. Eyes:     Conjunctiva/sclera: Conjunctivae normal.     Pupils: Pupils are equal, round, and reactive to light.  Neck:     Vascular: No carotid bruit.  Cardiovascular:     Rate and Rhythm: Normal rate and regular rhythm.     Heart sounds: Normal heart sounds.  Pulmonary:     Effort: Pulmonary effort is normal.     Breath sounds: Normal breath sounds.  Abdominal:     Palpations: Abdomen is soft. There is no pulsatile mass.     Tenderness: There is no abdominal tenderness.  Musculoskeletal:     Right lower leg: No edema.     Left lower leg: No edema.  Skin:    General: Skin is warm and dry.  Neurological:     General: No focal deficit present.     Mental Status: She is alert and oriented to person, place, and time.  Comments: Equal facial movements, no nystagmus on eye exam, no focal weakness.  Nonfocal exam. Equal UE/LE strength.   Psychiatric:        Mood and Affect: Mood normal.        Behavior: Behavior normal.        Assessment & Plan:  LOREAL VALENTE is a 66 y.o. female . Uncontrolled type 2 diabetes mellitus with hyperglycemia (HCC) - Plan: Hemoglobin A1c, Dulaglutide (TRULICITY) 1.5 MG/0.5ML SOPN, ezetimibe (ZETIA) 10 MG tablet, Lipid panel, Comprehensive metabolic panel  -Check labs including A1c, medication adjustments accordingly.  Overall reassuring home readings, and significantly improved control last visit.  Rhinorrhea  -Stable with nasal spray as needed.  Continue same  Class 1 drug-induced obesity with serious comorbidity and body mass index (BMI) of 32.0 to 32.9 in adult - Plan: Dulaglutide (TRULICITY) 1.5 MG/0.5ML  SOPN  -As above check labs, no change in Trulicity dose for now.  Hypothyroidism due to acquired atrophy of thyroid - Plan: TSH, levothyroxine (SYNTHROID) 88 MCG tablet  -Check labs, adjust medications accordingly.  Acute on chronic combined systolic and diastolic heart failure (HCC)  -Appears euvolemic.  No med changes for now, continue follow-up with specialist as scheduled.  Needs flu shot - Plan: Flu Vaccine QUAD High Dose(Fluad)  Essential tremor-stable with topiramate, continue same dosing.  Plans on separate visit to discuss some ongoing ear symptoms, and possible memory symptoms.  Episodes described as above were fleeting, and may have just been distracted.  No concerns on discussion and office.   Meds ordered this encounter  Medications   Dulaglutide (TRULICITY) 1.5 MG/0.5ML SOPN    Sig: INJECT 1.5 MG (0.5ML) UNDER THE SKIN ONCE A WEEK    Dispense:  6 mL    Refill:  1   ezetimibe (ZETIA) 10 MG tablet    Sig: Take 1 tablet (10 mg total) by mouth daily.    Dispense:  90 tablet    Refill:  0   levothyroxine (SYNTHROID) 88 MCG tablet    Sig: TAKE 1 TABLET BY MOUTH EVERY DAY BEFORE BREAKFAST    Dispense:  90 tablet    Refill:  1   topiramate (TOPAMAX) 50 MG tablet    Sig: Take 1 tablet (50 mg total) by mouth 2 (two) times daily.    Dispense:  180 tablet    Refill:  1   Patient Instructions  Thanks for coming in today.  No med changes at this time.  If there are concerns on labs I will let you know.  Please follow-up in the next few weeks to discuss the ear symptoms and memory symptoms further. Return to the clinic or go to the nearest emergency room if any of your symptoms worsen or new symptoms occur.     Signed,   Meredith Staggers, MD [facility], [facility] 08/05/22 1:20 PM

## 2022-08-05 NOTE — Patient Instructions (Signed)
Thanks for coming in today.  No med changes at this time.  If there are concerns on labs I will let you know.  Please follow-up in the next few weeks to discuss the ear symptoms and memory symptoms further. Return to the clinic or go to the nearest emergency room if any of your symptoms worsen or new symptoms occur.

## 2022-08-06 ENCOUNTER — Encounter: Payer: Self-pay | Admitting: Family Medicine

## 2022-08-07 NOTE — Telephone Encounter (Signed)
error 

## 2022-08-11 ENCOUNTER — Other Ambulatory Visit: Payer: Self-pay | Admitting: Family Medicine

## 2022-08-11 DIAGNOSIS — E034 Atrophy of thyroid (acquired): Secondary | ICD-10-CM

## 2022-08-11 MED ORDER — LEVOTHYROXINE SODIUM 75 MCG PO TABS: 75.0000 ug | ORAL_TABLET | Freq: Every day | ORAL | 1 refills | Status: DC

## 2022-08-11 NOTE — Progress Notes (Signed)
See lab notes. Repeat TSH in 4- 6 weeks on new dose.

## 2022-08-29 ENCOUNTER — Other Ambulatory Visit: Payer: Self-pay | Admitting: Family Medicine

## 2022-08-29 DIAGNOSIS — E7800 Pure hypercholesterolemia, unspecified: Secondary | ICD-10-CM

## 2022-08-30 ENCOUNTER — Ambulatory Visit
Admission: EM | Admit: 2022-08-30 | Discharge: 2022-08-30 | Disposition: A | Discharge status: Home / Self Care | Payer: Medicare Other | Attending: Physician Assistant | Admitting: Physician Assistant

## 2022-08-30 ENCOUNTER — Encounter: Payer: Self-pay | Admitting: Emergency Medicine

## 2022-08-30 DIAGNOSIS — U071 COVID-19: Secondary | ICD-10-CM | POA: Diagnosis not present

## 2022-08-30 MED ORDER — PAXLOVID (150/100) 10 X 150 MG & 10 X 100MG PO TBPK: ORAL_TABLET | ORAL | 0 refills | Status: DC

## 2022-08-30 NOTE — ED Triage Notes (Signed)
Pt reports taking a home covid test on Friday and it was positive. States she started having a headache, body aches, dizziness and brain fog on Friday night. Reports she received the antiviral medication the previous time she had covid.

## 2022-08-30 NOTE — ED Provider Notes (Signed)
[facility]    CSN: [id number] Arrival date & time: 08/30/22  1450      History   Chief Complaint Chief Complaint  Patient presents with   Covid Positive    HPI Felicia Hanson is a 66 y.o. female.   Patient here today for evaluation of positive COVID test.  She reports that she tested positive for covid 3 days ago after multiple family members have been diagnosed with same she has not had any fever.  She does note some mild congestion.  She denies any shortness of breath.  She has had some headache, body aches.  Denies any nausea or vomiting but has had diarrhea.  She reports she has stayed Paxlovid in the past as well with same.  He has not been taking any over-the-counter medication for symptoms.  The history is provided by the patient.    Past Medical History:  Diagnosis Date   Allergy    Anxiety    CHF (congestive heart failure) (HCC) 01/20/2021   COPD (chronic obstructive pulmonary disease) (HCC)    Coronary artery calcification    Depression    Diabetes mellitus without complication (HCC)    Diverticulitis    with colon resection   GERD (gastroesophageal reflux disease)    on pepcid   Hyperlipidemia    Hypertension    Sleep apnea    cpap on order 01-01-22   Sleep paralysis    will be tested for narcolepsy per pt 01-01-22   Thyroid disease     Patient Active Problem List   Diagnosis Date Noted   Benign essential tremor 05/25/2022   Shortness of breath at rest 05/09/2021   Loud snoring 03/24/2021   Excessive daytime sleepiness 03/24/2021   Acute on chronic combined systolic and diastolic HF (heart failure) (HCC) 03/24/2021   Panlobular emphysema (HCC) 03/24/2021   GAD (generalized anxiety disorder) 03/24/2021   Atypical chest pain 01/22/2021   Respiratory distress 01/20/2021   Acute on chronic combined systolic and diastolic CHF (congestive heart failure) (HCC) 01/20/2021   Type 2 diabetes mellitus without complication, without long-term current  use of insulin (HCC) 10/26/2017   Generalized anxiety disorder 01/10/2014   Vitamin D deficiency 01/10/2014   Essential hypertension, benign 01/10/2014   Pure hypercholesterolemia 01/10/2014   Hypothyroidism 01/10/2014    Past Surgical History:  Procedure Laterality Date   BREAST EXCISIONAL BIOPSY Left    CESAREAN SECTION     x2   CHOLECYSTECTOMY     COLON SURGERY     diverticulitis with perforation; s/p colon resection.   Colonoscopy     COLONOSCOPY     DENTAL SURGERY     RIGHT/LEFT HEART CATH AND CORONARY ANGIOGRAPHY N/A 01/22/2021   Procedure: RIGHT/LEFT HEART CATH AND CORONARY ANGIOGRAPHY;  Surgeon: Yates Decamp, MD;  Location: [facility];  Service: Cardiovascular;  Laterality: N/A;   TUBAL LIGATION      OB History   No obstetric history on file.      Home Medications    Prior to Admission medications   Medication Sig Start Date End Date Taking? Authorizing Provider  nirmatrelvir & ritonavir (PAXLOVID, 150/100,) 10 x 150 MG & 10 x 100MG  TBPK Take 150 mg nirmatrelvir and 100 mg ritonavir PO BID for 5 days 08/30/22  Yes 10/30/22, PA-C  ACCU-CHEK AVIVA PLUS test strip daily. for testing 03/11/18   [provider]  albuterol (VENTOLIN HFA) 108 (90 Base) MCG/ACT inhaler Inhale 1 puff into the lungs  every 6 (six) hours as needed for wheezing or shortness of breath.    [provider]  ALPRAZolam Duanne Moron) 1 MG tablet Take 0.5 mg by mouth 3 (three) times daily as needed for anxiety or sleep.  08/21/13   [provider]  aspirin 81 MG tablet Take 81 mg by mouth daily.    [provider]  Azelastine HCl 137 MCG/SPRAY SOLN PLACE 1 SPRAY INTO BOTH NOSTRILS 2 (TWO) TIMES DAILY. USE IN EACH NOSTRIL AS DIRECTED IF RUNNY NOSE/DRAINAGE. 05/26/22   Wendie Agreste, MD  BLACK ELDERBERRY PO Take 10 mLs by mouth daily.    [provider]  cetirizine (ZYRTEC) 10 MG tablet Take 10 mg by mouth daily.    [provider]  Continuous  Blood Gluc Receiver (DEXCOM G6 RECEIVER) DEVI 1 Device by Does not apply route every morning. Device and supplies Patient not taking: Reported on 06/17/2022 04/18/20   Wendie Agreste, MD  dapagliflozin propanediol (FARXIGA) 10 MG TABS tablet Take 1 tablet (10 mg total) by mouth daily. 04/30/22   Tolia, Sunit, DO  Dulaglutide (TRULICITY) 1.5 PZ/0.2HE SOPN INJECT 1.5 MG (0.5ML) UNDER THE SKIN ONCE A WEEK 08/05/22   Wendie Agreste, MD  DULoxetine (CYMBALTA) 60 MG capsule Take 2 capsules (120 mg total) by mouth daily. 05/14/22   Wendie Agreste, MD  ezetimibe (ZETIA) 10 MG tablet Take 1 tablet (10 mg total) by mouth daily. 08/05/22   Wendie Agreste, MD  famotidine (PEPCID) 20 MG tablet Take 20 mg by mouth daily as needed for heartburn or indigestion.    [provider]  fluticasone (FLONASE) 50 MCG/ACT nasal spray Place 2 sprays into both nostrils daily. 03/15/22   Leath-Warren, Alda Lea, NP  fluticasone (FLOVENT HFA) 44 MCG/ACT inhaler Inhale 1-2 puffs into the lungs in the morning and at bedtime. 05/14/22   Wendie Agreste, MD  Insulin Pen Needle 31G X 5 MM MISC Inject 1 each into the skin daily. 02/10/21   Wendie Agreste, MD  LANTUS SOLOSTAR 100 UNIT/ML Solostar Pen INJECT 54 UNITS INTO THE SKIN AT BEDTIME. 08/04/22   Wendie Agreste, MD  levothyroxine (SYNTHROID) 75 MCG tablet Take 1 tablet (75 mcg total) by mouth daily before breakfast. TAKE 1 TABLET BY MOUTH EVERY DAY BEFORE BREAKFAST 08/11/22   Wendie Agreste, MD  magnesium oxide (MAG-OX) 400 (240 Mg) MG tablet TAKE 0.5 TABLETS (200 MG TOTAL) BY MOUTH 2 TIMES DAILY. 01/06/22   Tolia, Sunit, DO  metoprolol succinate (TOPROL-XL) 100 MG 24 hr tablet TAKE 1 TABLET BY MOUTH DAILY. TAKE WITH OR IMMEDIATELY FOLLOWING A MEAL. 12/29/21   Tolia, Sunit, DO  nitroGLYCERIN (NITROSTAT) 0.4 MG SL tablet PLACE 1 TABLET UNDER THE TONGUE EVERY 5 MINUTES AS NEEDED FOR CHEST PAIN. IF YOU REQUIRE MORE THAN TWO TABLETS FIVE MINUTES APART GO TO THE NEAREST  ER VIA EMS. Patient taking differently: Place 0.4 mg under the tongue every 5 (five) minutes as needed for chest pain. 07/29/20   Tolia, Sunit, DO  omeprazole (PRILOSEC) 20 MG capsule TAKE 1 CAPSULE BY MOUTH EVERY DAY 06/14/22   Wendie Agreste, MD  ondansetron (ZOFRAN-ODT) 4 MG disintegrating tablet Take 1 tablet (4 mg total) by mouth every 8 (eight) hours as needed. 02/01/22   Francene Finders, PA-C  pravastatin (PRAVACHOL) 80 MG tablet TAKE 1 TABLET BY MOUTH EVERY EVENING 11/29/21   Wendie Agreste, MD  predniSONE (DELTASONE) 20 MG tablet Take 2 tablets (40 mg total)  by mouth daily with breakfast. 03/26/22   Shade Flood, MD  promethazine-dextromethorphan (PROMETHAZINE-DM) 6.25-15 MG/5ML syrup Take 5 mLs by mouth 4 (four) times daily as needed for cough. 03/15/22   Leath-Warren, Sadie Haber, NP  sacubitril-valsartan (ENTRESTO) 97-103 MG Take 1 tablet by mouth 2 (two) times daily. 02/05/22   Tolia, Sunit, DO  spironolactone (ALDACTONE) 25 MG tablet TAKE 1 TABLET BY MOUTH EVERY DAY 03/24/22   Shade Flood, MD  topiramate (TOPAMAX) 50 MG tablet Take 1 tablet (50 mg total) by mouth 2 (two) times daily. 08/05/22   Shade Flood, MD  Vitamin D, Ergocalciferol, (DRISDOL) 1.25 MG (50000 UNIT) CAPS capsule TAKE 1 CAPSULE (50,000 UNITS TOTAL) BY MOUTH EVERY 7 (SEVEN) DAYS 03/24/22   Shade Flood, MD    Family History Family History  Problem Relation Age of Onset   Colon polyps Mother    Cancer Mother        unknown primary   Hyperlipidemia Mother    Other Father    Breast cancer Paternal Aunt    Esophageal cancer Paternal Uncle    Colon cancer Neg Hx    Rectal cancer Neg Hx    Stomach cancer Neg Hx     Social History Social History   Tobacco Use   Smoking status: Every Day    Packs/day: 0.25    Years: 38.00    Total pack years: 9.50    Types: Cigarettes   Smokeless tobacco: Never   Tobacco comments:    2-5 A DAY  Vaping Use   Vaping Use: Never used  Substance Use Topics    Alcohol use: No    Alcohol/week: 0.0 standard drinks of alcohol   Drug use: No     Allergies   Metformin and related, Nicotine, and Ace inhibitors   Review of Systems Review of Systems  Constitutional:  Negative for chills and fever.  HENT:  Positive for congestion. Negative for ear pain and sore throat.   Eyes:  Negative for discharge and redness.  Respiratory:  Negative for cough, shortness of breath and wheezing.   Gastrointestinal:  Positive for diarrhea. Negative for abdominal pain, nausea and vomiting.  Musculoskeletal:  Positive for myalgias.  Neurological:  Positive for headaches.     Physical Exam Triage Vital Signs ED Triage Vitals  Enc Vitals Group     BP      Pulse      Resp      Temp      Temp src      SpO2      Weight      Height      Head Circumference      Peak Flow      Pain Score      Pain Loc      Pain Edu?      Excl. in GC?    No data found.  Updated Vital Signs BP 133/76 (BP Location: Left Arm)   Pulse 90   Temp 98.3 F (36.8 C) (Oral)   Resp 18   SpO2 94%      Physical Exam Vitals and nursing note reviewed.  Constitutional:      General: She is not in acute distress.    Appearance: Normal appearance. She is not ill-appearing.  HENT:     Head: Normocephalic and atraumatic.     Nose: Congestion present.     Mouth/Throat:     Mouth: Mucous membranes are moist.  Pharynx: No oropharyngeal exudate or posterior oropharyngeal erythema.  Eyes:     Conjunctiva/sclera: Conjunctivae normal.  Cardiovascular:     Rate and Rhythm: Normal rate and regular rhythm.     Heart sounds: Normal heart sounds. No murmur heard. Pulmonary:     Effort: Pulmonary effort is normal. No respiratory distress.     Breath sounds: Normal breath sounds. No wheezing, rhonchi or rales.  Skin:    General: Skin is warm and dry.  Neurological:     Mental Status: She is alert.  Psychiatric:        Mood and Affect: Mood normal.        Thought Content:  Thought content normal.      UC Treatments / Results  Labs (all labs ordered are listed, but only abnormal results are displayed) Labs Reviewed - No data to display  EKG   Radiology No results found.  Procedures Procedures (including critical care time)  Medications Ordered in UC Medications - No data to display  Initial Impression / Assessment and Plan / UC Course  I have reviewed the triage vital signs and the nursing notes.  Pertinent labs & imaging results that were available during my care of the patient were reviewed by me and considered in my medical decision making (see chart for details).    Paxlovid prescribed at renally adjusted dose. Encouraged follow up with any further concerns. Recommend ED for any worsening. Patient expresses understanding.   Final Clinical Impressions(s) / UC Diagnoses   Final diagnoses:  COVID-19   Discharge Instructions   None    ED Prescriptions     Medication Sig Dispense Auth. Provider   nirmatrelvir & ritonavir (PAXLOVID, 150/100,) 10 x 150 MG & 10 x 100MG  TBPK Take 150 mg nirmatrelvir and 100 mg ritonavir PO BID for 5 days 10 tablet , PA-C      PDMP not reviewed this encounter.   Tomi Bamberger, PA-C 08/30/22 1606

## 2022-08-31 ENCOUNTER — Telehealth: Payer: Self-pay | Admitting: Family Medicine

## 2022-08-31 NOTE — Telephone Encounter (Signed)
Initial Comment Caller States she has an appointment on October 3 for blood work but she tested positive yesterday morning for COVID. Caller states she is feeling bad, she has headache, stuffy head, sore throat, ear congestion. Caller states she wanted to know if the office still wanted to know if they still wanted her to come in and if she can get a medication. Translation No Nurse Assessment Nurse: Charna Elizabeth, RN, Cathy Date/Time (Eastern Time): 08/29/2022 11:42:37 AM Confirm and document reason for call. If symptomatic, describe symptoms. ---Sherika states that she had a positive home COVID test yesterday. She developed sore throat, headache, cough and congestion yesterday. No severe breathing difficulty or blueness around her lips. No chest pain. Alert and responsive. Does the patient have any new or worsening symptoms? ---Yes Will a triage be completed? ---Yes Related visit to physician within the last 2 weeks? ---No Does the PT have any chronic conditions? (i.e. diabetes, asthma, this includes High risk factors for pregnancy, etc.) ---Yes List chronic conditions. 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 ---No PLEASE NOTE: All timestamps contained within this report are represented as [place] Standard Time. CONFIDENTIALTY NOTICE: This fax transmission is intended only for the addressee. It contains information that is legally privileged, confidential or otherwise protected from use or disclosure. If you are not the intended recipient, you are strictly prohibited from reviewing, disclosing, copying using or disseminating any of this information or taking any action in reliance on or regarding this information. If you have received this fax in error, please notify us immediately by telephone so that we can arrange for its return to [place]. Phone: [phone], Toll-Free: [phone], Fax: [phone] Page: 2 of 3 Call Id:  [id number] Guidelines Guideline Title Affirmed Question Affirmed Notes Nurse Date/Time Lamount Cohen Time) COVID-19 - Diagnosed or Suspected [1] HIGH RISK patient (e.g., weak immune system, age > 64 years, obesity with BMI 30 or higher, pregnant, chronic lung disease or other chronic medical condition) AND [2] COVID symptoms (e.g., cough, fever) (Exceptions: Already seen by PCP and no new or worsening symptoms.) Charna Elizabeth, RN, Cathy 08/29/2022 11:46:53 AM Disp. Time Lamount Cohen Time) Disposition Final User 08/29/2022 11:53:30 AM See HCP within 4 Hours (or PCP triage) Yes Charna Elizabeth, RN, Lynden Ang Final Disposition 08/29/2022 11:53:30 AM See HCP within 4 Hours (or PCP triage) Yes Charna Elizabeth, RN, Lynden Ang Disposition Overriden: Call PCP within 24 Hours Override Reason: Patient's symptoms need a higher level of care Caller Disagree/Comply Comply Caller Understands Yes PreDisposition Call Doctor Care Advice Given Per Guideline SEE HCP (OR PCP TRIAGE) WITHIN 4 HOURS: GENERAL CARE ADVICE FOR COVID-19 SYMPTOMS: * Feeling dehydrated: Drink extra liquids. If the air in your home is dry, use a humidifier. * Fever, Chills, and Sweats: For fever over 101 F (38.3 C), take acetaminophen every 4 to 6 hours (Adults 650 mg) OR ibuprofen every 6 to 8 hours (Adults 400 mg). Before taking any medicine, read all the instructions on the package. Do not take aspirin unless your doctor has prescribed it for you. Chills can sometimes come before a fever. You may feel cold in your hands and feet. You may have shivering. You may also feel sweaty as your body temperature goes down. CALL BACK IF: * You become worse CARE ADVICE given per COVID-19 - DIAGNOSED OR SUSPECTED (Adult) guideline. COVID-19 - HOW TO PROTECT OTHERS - WHEN YOU ARE SICK WITH COVID-19: * WASH HANDS OFTEN: Wash hands often with soap and water. After coughing or sneezing are  important times. If soap and water are not available, use an alcohol-based hand  sanitizer with at least 60% alcohol, covering all surfaces of your hands and rubbing them together until they feel dry. Avoid touching your eyes, nose, and mouth with unwashed hands. * WEAR A MASK FOR 10 DAYS: Wear a well-fitted mask for 10 full days any time you are around others inside your home or in public. Do not go to places where you are unable to wear a mask. FEVER MEDICINES: * ACETAMINOPHEN - REGULAR STRENGTH TYLENOL: Take 650 mg (two 325 mg pills) by mouth every 4 to 6 hours as needed. Each Regular Strength Tylenol pill has 325 mg of acetaminophen. The most PLEASE NOTE: All timestamps contained within this report are represented as [place] Standard Time. CONFIDENTIALTY NOTICE: This fax transmission is intended only for the addressee. It contains information that is legally privileged, confidential or otherwise protected from use or disclosure. If you are not the intended recipient, you are strictly prohibited from reviewing, disclosing, copying using or disseminating any of this information or taking any action in reliance on or regarding this information. If you have received this fax in error, please notify us immediately by telephone so that we can arrange for its return to [place]. Phone: [phone], Toll-Free: [phone], Fax: [phone] Page: 3 of 3 Call Id: [id number] Care Advice Given Per Guideline you should take is 10 pills a day (3,250 mg total). Note: In [place], the maximum is 12 pills a day (3,900 mg total). * They are overthe-counter (OTC) drugs that help treat both fever and pain. You can buy them at the drugstore. Referrals [facility] [place] with pt. Pt states that she tested positive for Covid  08/28/22. Pravastatin 80 mg was sent in for pt. Pt  states that she will call office if she needs an appt.

## 2022-08-31 NOTE — Telephone Encounter (Signed)
I offer an appt to pt, pt stated that she will call if she needs appt.

## 2022-09-01 ENCOUNTER — Other Ambulatory Visit: Payer: Medicare Other

## 2022-09-07 ENCOUNTER — Telehealth: Payer: Self-pay | Admitting: Family Medicine

## 2022-09-07 NOTE — Telephone Encounter (Signed)
Caller name: Artesha  On DPR? :yes/no: Yes  Call back number: [phone]  Provider they see:  Carlota Raspberry  Reason for call: Pt forgot to take trulicity shot on Friday like she normally does; wants to know if it is ok to go ahead take trulicity shot today.

## 2022-09-07 NOTE — Telephone Encounter (Signed)
Spoke to the pt and advised it was ok for her to take the Trulicity shot today

## 2022-09-08 ENCOUNTER — Ambulatory Visit: Payer: Medicare Other | Admitting: Cardiology

## 2022-09-14 ENCOUNTER — Other Ambulatory Visit: Payer: Self-pay | Admitting: Family Medicine

## 2022-09-14 DIAGNOSIS — R7989 Other specified abnormal findings of blood chemistry: Secondary | ICD-10-CM

## 2022-09-28 ENCOUNTER — Ambulatory Visit: Payer: Medicare Other | Admitting: Cardiology

## 2022-09-28 ENCOUNTER — Encounter: Payer: Self-pay | Admitting: Cardiology

## 2022-09-28 VITALS — BP 146/78 | HR 82 | Ht 60.0 in | Wt 149.4 lb

## 2022-09-28 DIAGNOSIS — I447 Left bundle-branch block, unspecified: Secondary | ICD-10-CM

## 2022-09-28 DIAGNOSIS — Z794 Long term (current) use of insulin: Secondary | ICD-10-CM | POA: Diagnosis not present

## 2022-09-28 DIAGNOSIS — E781 Pure hyperglyceridemia: Secondary | ICD-10-CM | POA: Diagnosis not present

## 2022-09-28 DIAGNOSIS — I2510 Atherosclerotic heart disease of native coronary artery without angina pectoris: Secondary | ICD-10-CM | POA: Diagnosis not present

## 2022-09-28 DIAGNOSIS — F17210 Nicotine dependence, cigarettes, uncomplicated: Secondary | ICD-10-CM | POA: Diagnosis not present

## 2022-09-28 DIAGNOSIS — I10 Essential (primary) hypertension: Secondary | ICD-10-CM | POA: Diagnosis not present

## 2022-09-28 DIAGNOSIS — I428 Other cardiomyopathies: Secondary | ICD-10-CM | POA: Diagnosis not present

## 2022-09-28 DIAGNOSIS — E1165 Type 2 diabetes mellitus with hyperglycemia: Secondary | ICD-10-CM | POA: Diagnosis not present

## 2022-09-28 DIAGNOSIS — F17200 Nicotine dependence, unspecified, uncomplicated: Secondary | ICD-10-CM

## 2022-09-28 DIAGNOSIS — I5042 Chronic combined systolic (congestive) and diastolic (congestive) heart failure: Secondary | ICD-10-CM

## 2022-09-28 DIAGNOSIS — E7800 Pure hypercholesterolemia, unspecified: Secondary | ICD-10-CM

## 2022-09-28 MED ORDER — FENOFIBRATE 145 MG PO TABS: 145.0000 mg | ORAL_TABLET | Freq: Every day | ORAL | 0 refills | Status: DC

## 2022-09-28 NOTE — Progress Notes (Signed)
ID:  Felicia Hanson, DOB Jan 20, 1956, MRN [id number]  PCP:  Wendie Agreste, MD  Cardiologist:  Rex Kras, DO, [facility] (established care 04/22/2020)  Date: 09/28/22 Last Office Visit: 03/09/2022  Chief Complaint  Patient presents with   Chronic combined systolic and diastolic heart failure    Follow-up    HPI   Felicia Hanson is a 66 y.o. female whose past medical history and cardiovascular risk factors are: COVID-19 infection, insulin-dependent type 2 diabetes with hyperglycemia, pure hypercholesterolemia, hypertriglyceridemia, coronary artery calcification, nonischemic cardiomyopathy, left bundle branch block, essential hypertension, hypothyroidism, tobacco use disorder, vitamin D deficiency, generalized anxiety disorder.  Her last hospitalization for seizure was in February 2022 and since then her medications have been uptitrated in a stepwise fashion.  She presents today for 43-monthfollow-up visit.  She denies anginal discomfort or heart failure symptoms.  Over the last 6 months patient states that she has been working on lifestyle changes.  She is down to 2 cigarettes/day when compared to 6 months ago she was smoking anywhere from 2 to 4 cigarettes/day.  Her hemoglobin A1c has trended down from 13 to 5.9.  LDL levels have remained in acceptable limits.  But triglycerides are not well controlled.  She has lost approximately 12 pounds over the last 6 months.  No use of sublingual nitroglycerin tablets.  And she is walking at least 0.5 miles per day.  FUNCTIONAL STATUS: No structured exercise program or daily routine.  But does actively take care of her grandkids.  ALLERGIES: Allergies  Allergen Reactions   Metformin And Related Other (See Comments)    Could not swallow   Nicotine Rash    Only allergic to nicotine patch   Ace Inhibitors Cough    Cough with lisinopril.     MEDICATION LIST PRIOR TO VISIT: Current Meds  Medication Sig   ACCU-CHEK AVIVA PLUS test strip  daily. for testing   albuterol (VENTOLIN HFA) 108 (90 Base) MCG/ACT inhaler Inhale 1 puff into the lungs every 6 (six) hours as needed for wheezing or shortness of breath.   ALPRAZolam (XANAX) 1 MG tablet Take 0.5 mg by mouth 3 (three) times daily as needed for anxiety or sleep.    aspirin 81 MG tablet Take 81 mg by mouth daily.   Azelastine HCl 137 MCG/SPRAY SOLN PLACE 1 SPRAY INTO BOTH NOSTRILS 2 (TWO) TIMES DAILY. USE IN EACH NOSTRIL AS DIRECTED IF RUNNY NOSE/DRAINAGE.   BLACK ELDERBERRY PO Take 10 mLs by mouth daily.   cetirizine (ZYRTEC) 10 MG tablet Take 10 mg by mouth daily.   dapagliflozin propanediol (FARXIGA) 10 MG TABS tablet Take 1 tablet (10 mg total) by mouth daily.   Dulaglutide (TRULICITY) 1.5 MMB/5.5HRSOPN INJECT 1.5 MG (0.5ML) UNDER THE SKIN ONCE A WEEK   DULoxetine (CYMBALTA) 60 MG capsule Take 2 capsules (120 mg total) by mouth daily.   ezetimibe (ZETIA) 10 MG tablet Take 1 tablet (10 mg total) by mouth daily.   famotidine (PEPCID) 20 MG tablet Take 20 mg by mouth daily as needed for heartburn or indigestion.   fenofibrate (TRICOR) 145 MG tablet Take 1 tablet (145 mg total) by mouth daily.   fluticasone (FLONASE) 50 MCG/ACT nasal spray Place 2 sprays into both nostrils daily.   fluticasone (FLOVENT HFA) 44 MCG/ACT inhaler Inhale 1-2 puffs into the lungs in the morning and at bedtime.   Insulin Pen Needle 31G X 5 MM MISC Inject 1 each into the skin daily.   LANTUS SOLOSTAR  100 UNIT/ML Solostar Pen INJECT 54 UNITS INTO THE SKIN AT BEDTIME.   magnesium oxide (MAG-OX) 400 (240 Mg) MG tablet TAKE 0.5 TABLETS (200 MG TOTAL) BY MOUTH 2 TIMES DAILY.   metoprolol succinate (TOPROL-XL) 100 MG 24 hr tablet TAKE 1 TABLET BY MOUTH DAILY. TAKE WITH OR IMMEDIATELY FOLLOWING A MEAL.   nirmatrelvir & ritonavir (PAXLOVID, 150/100,) 10 x 150 MG & 10 x 100MG TBPK Take 150 mg nirmatrelvir and 100 mg ritonavir PO BID for 5 days   nitroGLYCERIN (NITROSTAT) 0.4 MG SL tablet PLACE 1 TABLET UNDER THE  TONGUE EVERY 5 MINUTES AS NEEDED FOR CHEST PAIN. IF YOU REQUIRE MORE THAN TWO TABLETS FIVE MINUTES APART GO TO THE NEAREST ER VIA EMS. (Patient taking differently: Place 0.4 mg under the tongue every 5 (five) minutes as needed for chest pain.)   omeprazole (PRILOSEC) 20 MG capsule TAKE 1 CAPSULE BY MOUTH EVERY DAY   ondansetron (ZOFRAN-ODT) 4 MG disintegrating tablet Take 1 tablet (4 mg total) by mouth every 8 (eight) hours as needed.   pravastatin (PRAVACHOL) 80 MG tablet TAKE 1 TABLET BY MOUTH EVERY DAY IN THE EVENING   sacubitril-valsartan (ENTRESTO) 97-103 MG Take 1 tablet by mouth 2 (two) times daily.   spironolactone (ALDACTONE) 25 MG tablet TAKE 1 TABLET BY MOUTH EVERY DAY   topiramate (TOPAMAX) 50 MG tablet Take 1 tablet (50 mg total) by mouth 2 (two) times daily.   Vitamin D, Ergocalciferol, (DRISDOL) 1.25 MG (50000 UNIT) CAPS capsule TAKE 1 CAPSULE (50,000 UNITS TOTAL) BY MOUTH EVERY 7 (SEVEN) DAYS   [DISCONTINUED] levothyroxine (SYNTHROID) 75 MCG tablet Take 1 tablet (75 mcg total) by mouth daily before breakfast. TAKE 1 TABLET BY MOUTH EVERY DAY BEFORE BREAKFAST     PAST MEDICAL HISTORY: Past Medical History:  Diagnosis Date   Allergy    Anxiety    CHF (congestive heart failure) ([place]) 01/20/2021   COPD (chronic obstructive pulmonary disease) (HCC)    Coronary artery calcification    Depression    Diabetes mellitus without complication ([place])    Diverticulitis    with colon resection   GERD (gastroesophageal reflux disease)    on pepcid   Hyperlipidemia    Hypertension    Sleep apnea    cpap on order 01-01-22   Sleep paralysis    will be tested for narcolepsy per pt 01-01-22   Thyroid disease     PAST SURGICAL HISTORY: Past Surgical History:  Procedure Laterality Date   BREAST EXCISIONAL BIOPSY Left    CESAREAN SECTION     x2   CHOLECYSTECTOMY     COLON SURGERY     diverticulitis with perforation; s/p colon resection.   Colonoscopy     COLONOSCOPY     DENTAL SURGERY      RIGHT/LEFT HEART CATH AND CORONARY ANGIOGRAPHY N/A 01/22/2021   Procedure: RIGHT/LEFT HEART CATH AND CORONARY ANGIOGRAPHY;  Surgeon: Adrian Prows, MD;  Location: [facility];  Service: Cardiovascular;  Laterality: N/A;   TUBAL LIGATION      FAMILY HISTORY: The patient family history includes Breast cancer in her paternal aunt; Cancer in her mother; Colon polyps in her mother; Esophageal cancer in her paternal uncle; Hyperlipidemia in her mother; Other in her father.  SOCIAL HISTORY:  The patient  reports that she has been smoking cigarettes. She has a 9.50 pack-year smoking history. She has never used smokeless tobacco. She reports that she does not drink alcohol and does not use drugs.  REVIEW OF  SYSTEMS: Review of Systems  Constitutional: Negative for chills and fever.  HENT:  Negative for hoarse voice and nosebleeds.   Eyes:  Negative for discharge, double vision and pain.  Cardiovascular:  Negative for chest pain, claudication, dyspnea on exertion, leg swelling, near-syncope, orthopnea, palpitations, paroxysmal nocturnal dyspnea and syncope.  Respiratory:  Negative for hemoptysis and shortness of breath.   Musculoskeletal:  Negative for muscle cramps and myalgias.  Gastrointestinal:  Negative for abdominal pain, constipation, diarrhea, hematemesis, hematochezia, melena, nausea and vomiting.  Neurological:  Negative for dizziness and light-headedness.    PHYSICAL EXAM:    09/28/2022    1:54 PM 08/30/2022    3:51 PM 08/05/2022    1:12 PM  Vitals with BMI  Height _0   _1   Weight 149 lbs 6 oz  155 lbs 3 oz  BMI 55.20  80.22  Systolic 336 122 449  Diastolic 78 76 68  Pulse 82 90 82   Physical Exam  Constitutional: No distress.  Appears older than stated age, hemodynamically stable.   Neck: No JVD present.  Cardiovascular: Normal rate, regular rhythm, S1 normal, S2 normal, intact distal pulses and normal pulses. Exam reveals no gallop, no S3 and no S4.  No murmur  heard. Pulmonary/Chest: Effort normal and breath sounds normal. No stridor. She has no wheezes. She has no rales.  Abdominal: Soft. Bowel sounds are normal. She exhibits no distension. There is no abdominal tenderness.  Musculoskeletal:        General: No edema.     Cervical back: Neck supple.  Neurological: She is alert and oriented to person, place, and time. She has intact cranial nerves (2-12).  Skin: Skin is warm and moist.   CARDIAC DATABASE:  EKG:  09/28/2022: NSR, 82bpm, LBBB, LAD, PRWP.   Coronary calcium scoring 05/13/2015: LAD: 8, Cx: 2. Total calcium score is 10.   Echocardiogram: 01/20/2021: LVEF 35-40%  05/15/2021: Technically difficult study. Left ventricle cavity is normal in size. Moderate concentric hypertrophy of the left ventricle. Mild global hypokinesis. LVEF 40-45%. Indeterminate diastolic filling pattern.  No significant valvular abnormality. Normal right atrial pressure.  Compared to previous study on 04/23/2020, LVEF is marginally improved form 35-40%.  Stress Testing: Lexiscan Tetrofosmin Stress Test  05/13/2020:  There is a fixed mild defect in the inferior region due to diaphragmatic attenuation. No ischemia or scar.  The LV is moderately dilated with LV end diastolic volume of 753 mL.  Overall LV systolic function is abnormal without regional wall motion abnormalities. Stress LV EF: 21%.   High risk study due to low LVEF. Findings suggest non ischemic dilated cardiomyopathy.  Heart Catheterization: Right + left heart catheterization 01/22/2021: RA 16/12, mean 11 mmHg, RA saturation 80%. RV 47/8, EDP 15 mmHg. PA 41/26, mean 33 mmHg.  PA saturation 77%. PW 32/26, mean 29 mmHg.  Aortic saturation 98%. Cardiac output 6.58, cardiac index 3.80 by Fick.  Stroke-volume 70 mL.  SVR 16.87, PVR 1.05.  QP/QS 0.86. LV: Global hypokinesis, EF 35%.  EDP markedly elevated at 32 mmHg.  There was no pressure gradient across the aortic valve. Left main: Mild  disease. LAD: Mild diffuse disease. CX: Mild diffuse disease.  I was told he was 73 something right RI: Mild diffuse disease. RCA: Dominant, mild diffuse disease.   Impression: Nonischemic cardiomyopathy with severe LV systolic dysfunction.  Mild to moderate pulmonary hypertension secondary to diastolic dysfunction with LV EDP elevation.  LABORATORY DATA:    Latest Ref Rng & Units 05/01/2022  3:16 PM 02/21/2021    4:04 PM 01/24/2021    9:05 AM  CBC  WBC 4.0 - 10.5 K/uL 11.1  11.3  15.3   Hemoglobin 12.0 - 15.0 g/dL 15.0  15.7  15.7   Hematocrit 36.0 - 46.0 % 45.7  47.5  44.9   Platelets 150.0 - 400.0 K/uL 265.0  194  221        Latest Ref Rng & Units 08/05/2022    2:04 PM 05/01/2022    3:16 PM 12/03/2021   12:37 PM  CMP  Glucose 70 - 99 mg/dL 114  143  115   BUN 6 - 23 mg/dL _0 Creatinine 0.40 - 1.20 mg/dL 1.37  1.41  1.25   Sodium 135 - 145 mEq/L 141  138  141   Potassium 3.5 - 5.1 mEq/L 4.5  4.4  4.5   Chloride 96 - 112 mEq/L 104  102  101   CO2 19 - 32 mEq/L _1 Calcium 8.4 - 10.5 mg/dL 9.8  9.9  9.8   Total Protein 6.0 - 8.3 g/dL 7.3   7.2   Total Bilirubin 0.2 - 1.2 mg/dL 0.4   0.5   Alkaline Phos 39 - 117 U/L 68   75   AST 0 - 37 U/L 18   16   ALT 0 - 35 U/L 11   9     Lipid Panel  Lab Results  Component Value Date   CHOL 132 08/05/2022   HDL 37.60 (L) 08/05/2022   LDLCALC 54 12/03/2021   LDLDIRECT 69.0 08/05/2022   TRIG 228.0 (H) 08/05/2022   CHOLHDL 4 08/05/2022    Lipid Panel Recent Labs    12/03/21 1237 08/05/22 1404  CHOL 122 132  TRIG 143.0 228.0*  LDLCALC 54  --   VLDL 28.6 45.6*  HDL 39.30 37.60*  CHOLHDL 3 4  LDLDIRECT  --  69.0    Lab Results  Component Value Date   HGBA1C 5.9 08/05/2022   HGBA1C 6.7 (H) 12/03/2021   HGBA1C 13.1 (A) 09/11/2021   No components found for: "NTPROBNP" Lab Results  Component Value Date   TSH 0.03 (L) 08/05/2022   TSH 0.22 (L) 12/03/2021   TSH 0.294 (L) 01/20/2021    IMPRESSION:     ICD-10-CM   1. Chronic combined systolic and diastolic heart failure (HCC)  I50.42 EKG 12-Lead    2. Nonischemic cardiomyopathy (HCC)  I42.8     3. LBBB (left bundle branch block)  I44.7     4. Coronary artery calcification seen on computed tomography  I25.10     5. Essential hypertension, benign  I10     6. Type 2 diabetes mellitus with hyperglycemia, with long-term current use of insulin (HCC)  E11.65    Z79.4     7. Long-term insulin use (HCC)  Z79.4     8. Hypertriglyceridemia  E78.1 fenofibrate (TRICOR) 145 MG tablet    Lipid Panel With LDL/HDL Ratio    LDL cholesterol, direct    CMP14+EGFR    9. Hypercholesterolemia  E78.00     10. Cigarette smoker  F17.210     11. Smoking  F17.200        RECOMMENDATIONS: Felicia Hanson is a 66 y.o. female whose past medical history and cardiac risk factors include:  insulin-dependent type 2 diabetes with hyperglycemia, pure hypercholesterolemia, hypertriglyceridemia, coronary artery calcification, nonischemic cardiomyopathy, left bundle branch block, essential hypertension,  hypothyroidism, tobacco use disorder, vitamin D deficiency, generalized anxiety disorder.  Chronic combined systolic and diastolic heart failure (HCC) / Nonischemic cardiomyopathy (HCC) / LBBB (left bundle branch block) Stage C, NYHA class II. Last CHF exacerbation February 2022. Most recent echocardiogram notes mildly reduced LVEF. Medications reconciled. Reemphasized the importance of improving her modifiable cardiovascular risk factors including but not limited to: Glycemic control, hypertension, hyperlipidemia, triglyceride management. TSH levels concerning for possible iatrogenic hyperthyroidism.  She plans to follow-up with PCP. Reemphasized the importance of complete smoking cessation.  Essential hypertension, benign Office blood pressures within acceptable limits but not at goal. Home blood pressures are better controlled. Medications reconciled  Type  2 diabetes mellitus with hyperglycemia, with long-term current use of insulin (HCC) A1c significantly improved compared to prior levels. Educated her on the importance of glycemic control. Continue statin therapy and therapeutic. Continue Entresto and [place]  Hypertriglyceridemia Start fenofibrate. Labs in 6 weeks to reevaluate therapy  Cigarette smoker Tobacco cessation counseling: Improving Currently smoking 2 cigarettes/day Patient was informed of the dangers of tobacco abuse including stroke, cancer, and MI, as well as benefits of tobacco cessation. Patient is willing to quit at this time. 5 mins were spent counseling patient cessation techniques. We discussed various methods to help quit smoking, including deciding on a date to quit, joining a support group, pharmacological agents- nicotine gum/patch/lozenges.  I will reassess her progress at the next follow-up visit  FINAL MEDICATION LIST END OF ENCOUNTER: Meds ordered this encounter  Medications   fenofibrate (TRICOR) 145 MG tablet    Sig: Take 1 tablet (145 mg total) by mouth daily.    Dispense:  90 tablet    Refill:  0     Medications Discontinued During This Encounter  Medication Reason   levothyroxine (SYNTHROID) 75 MCG tablet Dose change      Current Outpatient Medications:    ACCU-CHEK AVIVA PLUS test strip, daily. for testing, Disp: , Rfl: 2   albuterol (VENTOLIN HFA) 108 (90 Base) MCG/ACT inhaler, Inhale 1 puff into the lungs every 6 (six) hours as needed for wheezing or shortness of breath., Disp: , Rfl:    ALPRAZolam (XANAX) 1 MG tablet, Take 0.5 mg by mouth 3 (three) times daily as needed for anxiety or sleep. , Disp: , Rfl:    aspirin 81 MG tablet, Take 81 mg by mouth daily., Disp: , Rfl:    Azelastine HCl 137 MCG/SPRAY SOLN, PLACE 1 SPRAY INTO BOTH NOSTRILS 2 (TWO) TIMES DAILY. USE IN EACH NOSTRIL AS DIRECTED IF RUNNY NOSE/DRAINAGE., Disp: 1 mL, Rfl: 1   BLACK ELDERBERRY PO, Take 10 mLs by mouth daily.,  Disp: , Rfl:    cetirizine (ZYRTEC) 10 MG tablet, Take 10 mg by mouth daily., Disp: , Rfl:    dapagliflozin propanediol (FARXIGA) 10 MG TABS tablet, Take 1 tablet (10 mg total) by mouth daily., Disp: 90 tablet, Rfl: 3   Dulaglutide (TRULICITY) 1.5 KC/0.0LK SOPN, INJECT 1.5 MG (0.5ML) UNDER THE SKIN ONCE A WEEK, Disp: 6 mL, Rfl: 1   DULoxetine (CYMBALTA) 60 MG capsule, Take 2 capsules (120 mg total) by mouth daily., Disp: 180 capsule, Rfl: 1   ezetimibe (ZETIA) 10 MG tablet, Take 1 tablet (10 mg total) by mouth daily., Disp: 90 tablet, Rfl: 0   famotidine (PEPCID) 20 MG tablet, Take 20 mg by mouth daily as needed for heartburn or indigestion., Disp: , Rfl:    fenofibrate (TRICOR) 145 MG tablet, Take 1 tablet (145 mg total) by mouth daily., Disp:  90 tablet, Rfl: 0   fluticasone (FLONASE) 50 MCG/ACT nasal spray, Place 2 sprays into both nostrils daily., Disp: 16 g, Rfl: 0   fluticasone (FLOVENT HFA) 44 MCG/ACT inhaler, Inhale 1-2 puffs into the lungs in the morning and at bedtime., Disp: 1 each, Rfl: 12   Insulin Pen Needle 31G X 5 MM MISC, Inject 1 each into the skin daily., Disp: 100 each, Rfl: 2   LANTUS SOLOSTAR 100 UNIT/ML Solostar Pen, INJECT 54 UNITS INTO THE SKIN AT BEDTIME., Disp: 30 mL, Rfl: 1   magnesium oxide (MAG-OX) 400 (240 Mg) MG tablet, TAKE 0.5 TABLETS (200 MG TOTAL) BY MOUTH 2 TIMES DAILY., Disp: 90 tablet, Rfl: 1   metoprolol succinate (TOPROL-XL) 100 MG 24 hr tablet, TAKE 1 TABLET BY MOUTH DAILY. TAKE WITH OR IMMEDIATELY FOLLOWING A MEAL., Disp: 90 tablet, Rfl: 2   nirmatrelvir & ritonavir (PAXLOVID, 150/100,) 10 x 150 MG & 10 x 100MG TBPK, Take 150 mg nirmatrelvir and 100 mg ritonavir PO BID for 5 days, Disp: 10 tablet, Rfl: 0   nitroGLYCERIN (NITROSTAT) 0.4 MG SL tablet, PLACE 1 TABLET UNDER THE TONGUE EVERY 5 MINUTES AS NEEDED FOR CHEST PAIN. IF YOU REQUIRE MORE THAN TWO TABLETS FIVE MINUTES APART GO TO THE NEAREST ER VIA EMS. (Patient taking differently: Place 0.4 mg under the  tongue every 5 (five) minutes as needed for chest pain.), Disp: 25 tablet, Rfl: 1   omeprazole (PRILOSEC) 20 MG capsule, TAKE 1 CAPSULE BY MOUTH EVERY DAY, Disp: 90 capsule, Rfl: 1   ondansetron (ZOFRAN-ODT) 4 MG disintegrating tablet, Take 1 tablet (4 mg total) by mouth every 8 (eight) hours as needed., Disp: 20 tablet, Rfl: 0   pravastatin (PRAVACHOL) 80 MG tablet, TAKE 1 TABLET BY MOUTH EVERY DAY IN THE EVENING, Disp: 90 tablet, Rfl: 1   sacubitril-valsartan (ENTRESTO) 97-103 MG, Take 1 tablet by mouth 2 (two) times daily., Disp: 180 tablet, Rfl: 3   spironolactone (ALDACTONE) 25 MG tablet, TAKE 1 TABLET BY MOUTH EVERY DAY, Disp: 90 tablet, Rfl: 1   topiramate (TOPAMAX) 50 MG tablet, Take 1 tablet (50 mg total) by mouth 2 (two) times daily., Disp: 180 tablet, Rfl: 1   Vitamin D, Ergocalciferol, (DRISDOL) 1.25 MG (50000 UNIT) CAPS capsule, TAKE 1 CAPSULE (50,000 UNITS TOTAL) BY MOUTH EVERY 7 (SEVEN) DAYS, Disp: 13 capsule, Rfl: 1   Continuous Blood Gluc Receiver (DEXCOM G6 RECEIVER) DEVI, 1 Device by Does not apply route every morning. Device and supplies (Patient not taking: Reported on 06/17/2022), Disp: 1 each, Rfl: 0   levothyroxine (SYNTHROID) 88 MCG tablet, Take 88 mcg by mouth every morning., Disp: , Rfl:    predniSONE (DELTASONE) 20 MG tablet, Take 2 tablets (40 mg total) by mouth daily with breakfast. (Patient not taking: Reported on 09/28/2022), Disp: 6 tablet, Rfl: 0   promethazine-dextromethorphan (PROMETHAZINE-DM) 6.25-15 MG/5ML syrup, Take 5 mLs by mouth 4 (four) times daily as needed for cough. (Patient not taking: Reported on 09/28/2022), Disp: 140 mL, Rfl: 0  Orders Placed This Encounter  Procedures   Lipid Panel With LDL/HDL Ratio   LDL cholesterol, direct   CMP14+EGFR   EKG 12-Lead    --Continue cardiac medications as reconciled in final medication list. --Return in about 6 months (around 03/30/2023) for Follow up, heart failure management.. Or sooner if needed. --Continue  follow-up with your primary care physician regarding the management of your other chronic comorbid conditions.  Patient's questions and concerns were addressed to her satisfaction. She voices understanding of  the instructions provided during this encounter.   This note was created using a voice recognition software as a result there may be grammatical errors inadvertently enclosed that do not reflect the nature of this encounter. Every attempt is made to correct such errors.  Rex Kras, [place], [facility]  Pager: [phone] Office: [phone]

## 2022-10-02 ENCOUNTER — Other Ambulatory Visit: Payer: Self-pay

## 2022-10-02 DIAGNOSIS — E781 Pure hyperglyceridemia: Secondary | ICD-10-CM

## 2022-10-05 ENCOUNTER — Other Ambulatory Visit: Payer: Self-pay | Admitting: Family Medicine

## 2022-10-19 DIAGNOSIS — H43392 Other vitreous opacities, left eye: Secondary | ICD-10-CM | POA: Diagnosis not present

## 2022-10-19 DIAGNOSIS — H35033 Hypertensive retinopathy, bilateral: Secondary | ICD-10-CM | POA: Diagnosis not present

## 2022-10-19 DIAGNOSIS — H524 Presbyopia: Secondary | ICD-10-CM | POA: Diagnosis not present

## 2022-10-19 DIAGNOSIS — H2513 Age-related nuclear cataract, bilateral: Secondary | ICD-10-CM | POA: Diagnosis not present

## 2022-10-19 DIAGNOSIS — E119 Type 2 diabetes mellitus without complications: Secondary | ICD-10-CM | POA: Diagnosis not present

## 2022-10-19 LAB — HM DIABETES EYE EXAM

## 2022-10-20 ENCOUNTER — Telehealth: Payer: Self-pay | Admitting: Family Medicine

## 2022-10-20 ENCOUNTER — Other Ambulatory Visit: Payer: Self-pay

## 2022-10-20 DIAGNOSIS — E1165 Type 2 diabetes mellitus with hyperglycemia: Secondary | ICD-10-CM

## 2022-10-20 DIAGNOSIS — E661 Drug-induced obesity: Secondary | ICD-10-CM

## 2022-10-20 MED ORDER — TRULICITY 1.5 MG/0.5ML ~~LOC~~ SOAJ: SUBCUTANEOUS | 1 refills | Status: DC

## 2022-10-20 NOTE — Telephone Encounter (Signed)
Encourage patient to contact the pharmacy for refills or they can request refills through [facility]  (Please schedule appointment if patient has not been seen in over a year)    WHAT PHARMACY WOULD THEY LIKE THIS SENT TO: [facility] [place], [place] - [address]   MEDICATION NAME & DOSE: Trulicity 1.5 mg/0.5 ml  NOTES/COMMENTS FROM PATIENT: pt needs a refill on medication       Front office please notify patient: It takes 48-72 hours to process rx refill requests Ask patient to call pharmacy to ensure rx is ready before heading there.

## 2022-10-20 NOTE — Telephone Encounter (Signed)
Refill sent to [facility] pharmacy

## 2022-10-20 NOTE — Telephone Encounter (Signed)
Pharmacy just called stating that this refill need to go to [facility] by [facility] [place], [place] - [address]

## 2022-10-20 NOTE — Telephone Encounter (Signed)
Resent to [facility]

## 2022-10-24 ENCOUNTER — Other Ambulatory Visit: Payer: Self-pay | Admitting: Cardiology

## 2022-10-24 DIAGNOSIS — I5022 Chronic systolic (congestive) heart failure: Secondary | ICD-10-CM

## 2022-10-26 ENCOUNTER — Telehealth: Payer: Self-pay | Admitting: Lab

## 2022-10-26 NOTE — Telephone Encounter (Signed)
Received paperwork from [facility] and put it in Dr. Haze Justin folder to review

## 2022-10-27 ENCOUNTER — Encounter: Payer: Self-pay | Admitting: Family Medicine

## 2022-10-28 ENCOUNTER — Encounter: Payer: Self-pay | Admitting: Family Medicine

## 2022-10-28 ENCOUNTER — Ambulatory Visit (INDEPENDENT_AMBULATORY_CARE_PROVIDER_SITE_OTHER): Payer: Medicare Other | Admitting: Family Medicine

## 2022-10-28 VITALS — BP 122/76 | HR 77 | Temp 97.7°F | Ht 60.0 in | Wt 145.8 lb

## 2022-10-28 DIAGNOSIS — E034 Atrophy of thyroid (acquired): Secondary | ICD-10-CM | POA: Diagnosis not present

## 2022-10-28 DIAGNOSIS — Z794 Long term (current) use of insulin: Secondary | ICD-10-CM

## 2022-10-28 DIAGNOSIS — R040 Epistaxis: Secondary | ICD-10-CM | POA: Diagnosis not present

## 2022-10-28 DIAGNOSIS — J3489 Other specified disorders of nose and nasal sinuses: Secondary | ICD-10-CM

## 2022-10-28 DIAGNOSIS — E1129 Type 2 diabetes mellitus with other diabetic kidney complication: Secondary | ICD-10-CM

## 2022-10-28 DIAGNOSIS — E7800 Pure hypercholesterolemia, unspecified: Secondary | ICD-10-CM

## 2022-10-28 DIAGNOSIS — R519 Headache, unspecified: Secondary | ICD-10-CM

## 2022-10-28 DIAGNOSIS — R809 Proteinuria, unspecified: Secondary | ICD-10-CM

## 2022-10-28 DIAGNOSIS — Z720 Tobacco use: Secondary | ICD-10-CM

## 2022-10-28 DIAGNOSIS — R059 Cough, unspecified: Secondary | ICD-10-CM | POA: Diagnosis not present

## 2022-10-28 MED ORDER — LANTUS SOLOSTAR 100 UNIT/ML ~~LOC~~ SOPN: 54.0000 [IU] | PEN_INJECTOR | Freq: Every day | SUBCUTANEOUS | 1 refills | Status: DC

## 2022-10-28 MED ORDER — AZITHROMYCIN 250 MG PO TABS: ORAL_TABLET | ORAL | 0 refills | Status: DC

## 2022-10-28 MED ORDER — AZITHROMYCIN 250 MG PO TABS: ORAL_TABLET | ORAL | 0 refills | Status: AC

## 2022-10-28 MED ORDER — EZETIMIBE 10 MG PO TABS: 10.0000 mg | ORAL_TABLET | Freq: Every day | ORAL | 0 refills | Status: DC

## 2022-10-28 MED ORDER — FLUTICASONE PROPIONATE HFA 44 MCG/ACT IN AERO: 1.0000 | INHALATION_SPRAY | Freq: Two times a day (BID) | RESPIRATORY_TRACT | 12 refills | Status: DC

## 2022-10-28 MED ORDER — PRAVASTATIN SODIUM 80 MG PO TABS: 80.0000 mg | ORAL_TABLET | Freq: Every day | ORAL | 1 refills | Status: DC

## 2022-10-28 NOTE — Progress Notes (Signed)
Subjective:  Patient ID: Felicia Hanson, female    DOB: 01-05-1956  Age: 66 y.o. MRN: [id number]  CC:  Chief Complaint  Patient presents with   Diabetes    Pt states all is well   Hypothyroidism   Cough    Pt states she had been around ppl that has been sick but she started to have this cough Nov 26 and started to have a bloody nose as well, pt states she has had headaches on the left side     HPI Felicia Hanson presents for   Cough, congestion, headaches, epistaxis started Saturday 25th. Minimal occasional cough. Grandson and granddaughter were both sick that week with cough - no covid or flu testing. They have improved. Occasional congestion - blows nose with some discolored mucus at times, some clots of blood, some bloody nose at times on left.  No treatments.  No fever. No shortness of breath. Left sided headache - past 6 days. Has been improving. Pinched nerve feeling. Into left neck/upper back.  Congestion has improved some  No nasal sprays. No attempted treatments.  Possible early chest congestion but no dyspnea.  Concerned about pna. CXR clear on 05/01/22.    Diabetes: Complicated by hyperglycemia, microalbuminuria.  Significant improvement in A1c September 6.  Rare episodes of confusion discussed at her last visit.  She was still taking Lantus 54 units/day, Trulicity 1.5 mg weekly, Farxiga 10 mg daily and on pravastatin and Zetia for lipids, ARB with Entresto with history of CHF.  Off Lasix at that time.  Cardiology appointment October 30.  Started on fenofibrate for triglycerides.  Continued on [place].  Discussed smoking cessation. No med changes.  Cut back on smoking - once per day. Some stress, followed by psychiatrist - working on therapy - trying to find group outpatient therapy.  Optho, foot exam, pneumovax:  Up to date.  Home readings:  Fasting: 92, 94. No symptomatic lows. Improved energy, less Mountain Dew Postprandial - low 100's.    Lab Results   Component Value Date   HGBA1C 5.9 08/05/2022   HGBA1C 6.7 (H) 12/03/2021   HGBA1C 13.1 (A) 09/11/2021   Lab Results  Component Value Date   MICROALBUR 19.2 (H) 05/21/2021   LDLCALC 54 12/03/2021   CREATININE 1.37 (H) 08/05/2022    Hypothyroidism: Lab Results  Component Value Date   TSH 0.03 (L) 08/05/2022  TSH too low at her last visit, Synthroid dosage decreased to 88 mcg. Recheck in 4 to 6 weeks planned.  No new symptoms. Hair has come out with covid infection. Covid infection 10/1. Occasional cold intolerance. No new hair or skin changes - hair loss as above.  heart palpitations or new fatigue. No new weight changes.  Feels good mentally with positive energy. Out talking and socializing.       History Patient Active Problem List   Diagnosis Date Noted   Benign essential tremor 05/25/2022   Shortness of breath at rest 05/09/2021   Loud snoring 03/24/2021   Excessive daytime sleepiness 03/24/2021   Acute on chronic combined systolic and diastolic HF (heart failure) ([place]) 03/24/2021   Panlobular emphysema ([place]) 03/24/2021   GAD (generalized anxiety disorder) 03/24/2021   Atypical chest pain 01/22/2021   Respiratory distress 01/20/2021   Acute on chronic combined systolic and diastolic CHF (congestive heart failure) ([place]) 01/20/2021   Type 2 diabetes mellitus without complication, without long-term current use of insulin ([place]) 10/26/2017   Generalized anxiety disorder 01/10/2014  Vitamin D deficiency 01/10/2014   Essential hypertension, benign 01/10/2014   Pure hypercholesterolemia 01/10/2014   Hypothyroidism 01/10/2014   Past Medical History:  Diagnosis Date   Allergy    Anxiety    CHF (congestive heart failure) ([place]) 01/20/2021   COPD (chronic obstructive pulmonary disease) ([place])    Coronary artery calcification    Depression    Diabetes mellitus without complication ([place])    Diverticulitis    with colon resection   GERD (gastroesophageal reflux disease)     on pepcid   Hyperlipidemia    Hypertension    Sleep apnea    cpap on order 01-01-22   Sleep paralysis    will be tested for narcolepsy per pt 01-01-22   Thyroid disease    Past Surgical History:  Procedure Laterality Date   BREAST EXCISIONAL BIOPSY Left    CESAREAN SECTION     x2   CHOLECYSTECTOMY     COLON SURGERY     diverticulitis with perforation; s/p colon resection.   Colonoscopy     COLONOSCOPY     DENTAL SURGERY     RIGHT/LEFT HEART CATH AND CORONARY ANGIOGRAPHY N/A 01/22/2021   Procedure: RIGHT/LEFT HEART CATH AND CORONARY ANGIOGRAPHY;  Surgeon: Adrian Prows, MD;  Location: [facility];  Service: Cardiovascular;  Laterality: N/A;   TUBAL LIGATION     Allergies  Allergen Reactions   Metformin And Related Other (See Comments)    Could not swallow   Nicotine Rash    Only allergic to nicotine patch   Ace Inhibitors Cough    Cough with lisinopril.    Prior to Admission medications   Medication Sig Start Date End Date Taking? Authorizing Provider  ACCU-CHEK AVIVA PLUS test strip daily. for testing 03/11/18  Yes [provider]  albuterol (VENTOLIN HFA) 108 (90 Base) MCG/ACT inhaler Inhale 1 puff into the lungs every 6 (six) hours as needed for wheezing or shortness of breath.   Yes [provider]  ALPRAZolam Duanne Moron) 1 MG tablet Take 0.5 mg by mouth 3 (three) times daily as needed for anxiety or sleep.  08/21/13  Yes [provider]  aspirin 81 MG tablet Take 81 mg by mouth daily.   Yes [provider]  BLACK ELDERBERRY PO Take 10 mLs by mouth daily.   Yes [provider]  cetirizine (ZYRTEC) 10 MG tablet Take 10 mg by mouth daily.   Yes [provider]  dapagliflozin propanediol (FARXIGA) 10 MG TABS tablet Take 1 tablet (10 mg total) by mouth daily. 04/30/22  Yes Tolia, Sunit, DO  Dulaglutide (TRULICITY) 1.5 0000000 SOPN INJECT 1.5 MG (0.5ML) UNDER THE SKIN ONCE A WEEK 10/20/22  Yes Wendie Agreste, MD  DULoxetine  (CYMBALTA) 60 MG capsule Take 2 capsules (120 mg total) by mouth daily. 05/14/22  Yes Wendie Agreste, MD  ezetimibe (ZETIA) 10 MG tablet Take 1 tablet (10 mg total) by mouth daily. 08/05/22  Yes Wendie Agreste, MD  famotidine (PEPCID) 20 MG tablet Take 20 mg by mouth daily as needed for heartburn or indigestion.   Yes [provider]  fluticasone (FLOVENT HFA) 44 MCG/ACT inhaler Inhale 1-2 puffs into the lungs in the morning and at bedtime. 05/14/22  Yes Wendie Agreste, MD  Insulin Pen Needle 31G X 5 MM MISC Inject 1 each into the skin daily. 02/10/21  Yes Wendie Agreste, MD  LANTUS SOLOSTAR 100 UNIT/ML Solostar Pen INJECT 54 UNITS INTO THE SKIN AT BEDTIME. 08/04/22  Yes Wendie Agreste, MD  levothyroxine (SYNTHROID) 88 MCG tablet Take 88 mcg by mouth every morning. 08/28/22  Yes [provider]  magnesium oxide (MAG-OX) 400 (240 Mg) MG tablet TAKE 0.5 TABLETS (200 MG TOTAL) BY MOUTH 2 TIMES DAILY. 01/06/22  Yes Tolia, Sunit, DO  metoprolol succinate (TOPROL-XL) 100 MG 24 hr tablet TAKE 1 TABLET BY MOUTH EVERY DAY WITH OR IMMEDIATELY FOLLOWING A MEAL 10/26/22  Yes Tolia, Sunit, DO  nitroGLYCERIN (NITROSTAT) 0.4 MG SL tablet PLACE 1 TABLET UNDER THE TONGUE EVERY 5 MINUTES AS NEEDED FOR CHEST PAIN. IF YOU REQUIRE MORE THAN TWO TABLETS FIVE MINUTES APART GO TO THE NEAREST ER VIA EMS. Patient taking differently: Place 0.4 mg under the tongue every 5 (five) minutes as needed for chest pain. 07/29/20  Yes Tolia, Sunit, DO  pravastatin (PRAVACHOL) 80 MG tablet TAKE 1 TABLET BY MOUTH EVERY DAY IN THE EVENING 08/31/22  Yes Wendie Agreste, MD  sacubitril-valsartan (ENTRESTO) 97-103 MG Take 1 tablet by mouth 2 (two) times daily. 02/05/22  Yes Tolia, Sunit, DO  spironolactone (ALDACTONE) 25 MG tablet TAKE 1 TABLET BY MOUTH EVERY DAY 10/05/22  Yes Wendie Agreste, MD  topiramate (TOPAMAX) 50 MG tablet Take 1 tablet (50 mg total) by mouth 2 (two) times daily. 08/05/22  Yes Wendie Agreste, MD   Vitamin D, Ergocalciferol, (DRISDOL) 1.25 MG (50000 UNIT) CAPS capsule TAKE 1 CAPSULE (50,000 UNITS TOTAL) BY MOUTH EVERY 7 (SEVEN) DAYS 09/14/22  Yes Wendie Agreste, MD  Azelastine HCl 137 MCG/SPRAY SOLN PLACE 1 SPRAY INTO BOTH NOSTRILS 2 (TWO) TIMES DAILY. USE IN EACH NOSTRIL AS DIRECTED IF RUNNY NOSE/DRAINAGE. Patient not taking: Reported on 10/28/2022 05/26/22   Wendie Agreste, MD  Continuous Blood Gluc Receiver (DEXCOM G6 RECEIVER) DEVI 1 Device by Does not apply route every morning. Device and supplies Patient not taking: Reported on 06/17/2022 04/18/20   Wendie Agreste, MD  fenofibrate (TRICOR) 145 MG tablet Take 1 tablet (145 mg total) by mouth daily. 09/28/22 12/27/22  Tolia, Sunit, DO  fluticasone (FLONASE) 50 MCG/ACT nasal spray Place 2 sprays into both nostrils daily. Patient not taking: Reported on 10/28/2022 03/15/22   Leath-Warren, Alda Lea, NP  nirmatrelvir & ritonavir (PAXLOVID, 150/100,) 10 x 150 MG & 10 x 100MG  TBPK Take 150 mg nirmatrelvir and 100 mg ritonavir PO BID for 5 days Patient not taking: Reported on 10/28/2022 08/30/22   Francene Finders, PA-C  omeprazole (PRILOSEC) 20 MG capsule TAKE 1 CAPSULE BY MOUTH EVERY DAY Patient not taking: Reported on 10/28/2022 06/14/22   Wendie Agreste, MD  ondansetron (ZOFRAN-ODT) 4 MG disintegrating tablet Take 1 tablet (4 mg total) by mouth every 8 (eight) hours as needed. Patient not taking: Reported on 10/28/2022 02/01/22   Francene Finders, PA-C  predniSONE (DELTASONE) 20 MG tablet Take 2 tablets (40 mg total) by mouth daily with breakfast. Patient not taking: Reported on 09/28/2022 03/26/22   Wendie Agreste, MD  promethazine-dextromethorphan (PROMETHAZINE-DM) 6.25-15 MG/5ML syrup Take 5 mLs by mouth 4 (four) times daily as needed for cough. Patient not taking: Reported on 09/28/2022 03/15/22   Leath-Warren, Alda Lea, NP   Social History   Socioeconomic History   Marital status: Married    Spouse name: Not on file    Number of children: 2   Years of education: some college   Highest education level: Not on file  Occupational History   Occupation: Retired  Tobacco Use   Smoking status: Every  Day    Packs/day: 0.25    Years: 38.00    Total pack years: 9.50    Types: Cigarettes   Smokeless tobacco: Never   Tobacco comments:    2-5 A DAY  Vaping Use   Vaping Use: Never used  Substance and Sexual Activity   Alcohol use: No    Alcohol/week: 0.0 standard drinks of alcohol   Drug use: No   Sexual activity: Not Currently  Other Topics Concern   Not on file  Social History Narrative   Marital status: married      Children:  2 children; 4 grandchildren      Lives: with husband, 2 granddaughters      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   Left-handed.   Caffeine use: 2.5 cups caffeine per day.   Social Determinants of Health   Financial Resource Strain: Low Risk  (06/17/2022)   Overall Financial Resource Strain (CARDIA)    Difficulty of Paying Living Expenses: Not hard at all  Food Insecurity: No Food Insecurity (06/17/2022)   Hunger Vital Sign    Worried About Running Out of Food in the Last Year: Never true    Ran Out of Food in the Last Year: Never true  Transportation Needs: No Transportation Needs (06/17/2022)   PRAPARE - Hydrologist (Medical): No    Lack of Transportation (Non-Medical): No  Physical Activity: Insufficiently Active (06/17/2022)   Exercise Vital Sign    Days of Exercise per Week: 3 days    Minutes of Exercise per Session: 30 min  Stress: No Stress Concern Present (06/17/2022)   [place]    Feeling of Stress : Not at all  Social Connections: Moderately Integrated (06/17/2022)   Social Connection and Isolation Panel [NHANES]    Frequency of Communication with Friends and Family: Three times a week    Frequency of  Social Gatherings with Friends and Family: Three times a week    Attends Religious Services: More than 4 times per year    Active Member of Clubs or Organizations: No    Attends Archivist Meetings: Never    Marital Status: Married  Human resources officer Violence: Not At Risk (06/17/2022)   Humiliation, Afraid, Rape, and Kick questionnaire    Fear of Current or Ex-Partner: No    Emotionally Abused: No    Physically Abused: No    Sexually Abused: No    Review of Systems  Constitutional:  Negative for fatigue and unexpected weight change.  Respiratory:  Negative for chest tightness and shortness of breath.   Cardiovascular:  Negative for chest pain, palpitations and leg swelling.  Gastrointestinal:  Negative for abdominal pain and blood in stool.  Neurological:  Negative for dizziness, syncope, light-headedness and headaches.  Per HPI.    Objective:   Vitals:   10/28/22 1425  BP: 122/76  Pulse: 77  Temp: 97.7 F (36.5 C)  SpO2: 97%  Weight: 145 lb 12.8 oz (66.1 kg)  Height: 5' (1.524 m)     Physical Exam Vitals reviewed.  Constitutional:      General: She is not in acute distress.    Appearance: Normal appearance. She is well-developed.  HENT:     Head: Normocephalic and atraumatic.     Right Ear: Hearing, tympanic membrane, ear canal and external ear  normal.     Left Ear: Hearing, tympanic membrane, ear canal and external ear normal.     Nose: Nose normal. No congestion or rhinorrhea.     Comments: No bleeding or dried blood/wounds in nares. Sinuses nontender.     Mouth/Throat:     Mouth: Mucous membranes are moist.     Pharynx: No posterior oropharyngeal erythema.  Eyes:     Conjunctiva/sclera: Conjunctivae normal.     Pupils: Pupils are equal, round, and reactive to light.  Neck:     Vascular: No carotid bruit.  Cardiovascular:     Rate and Rhythm: Normal rate and regular rhythm.     Heart sounds: Normal heart sounds. No murmur heard. Pulmonary:      Effort: Pulmonary effort is normal. No respiratory distress.     Breath sounds: Normal breath sounds. No wheezing or rhonchi.     Comments: clear Abdominal:     Palpations: Abdomen is soft. There is no pulsatile mass.     Tenderness: There is no abdominal tenderness.  Musculoskeletal:     Right lower leg: No edema.     Left lower leg: No edema.  Skin:    General: Skin is warm and dry.     Findings: No rash.  Neurological:     Mental Status: She is alert and oriented to person, place, and time.  Psychiatric:        Mood and Affect: Mood normal.        Behavior: Behavior normal.    50 minutes spent during visit, including chart review, review of chronic meds and conditions as above as well as additional acute symptoms of cough, epistaxis, headache.  Additional time with counseling and assimilation of information, exam, discussion of plan, and chart completion.    Assessment & Plan:  Felicia Hanson is a 66 y.o. female . Cough, unspecified type - Plan: azithromycin (ZITHROMAX) 250 MG tablet, DG Chest 2 View Rhinorrhea Epistaxis Nonintractable episodic headache, unspecified headache type  -Possible viral illness with headache, cough, few episodes of epistaxis with some clots with nasal discharge.  All symptoms appear to be improving.  Lungs were clear, but does note some recent chest congestion.  Reassuring exam.  Continue symptomatic care with saline nasal spray to minimize nosebleeds, humidifier in room, Mucinex as needed for cough.  Azithromycin printed and chest x-ray order placed if chest congestion is not improving in the next few days or worsens.  RTC/ER precautions given.  Potential side effects and risks of antibiotics were discussed.  -No wheeze, lungs clear, continue Flovent  Type 2 diabetes mellitus with microalbuminuria, with long-term current use of insulin (HCC) - Plan: ezetimibe (ZETIA) 10 MG tablet, insulin glargine (LANTUS SOLOSTAR) 100 UNIT/ML Solostar Pen, Basic  metabolic panel, Hemoglobin A1c, Microalbumin / creatinine urine ratio  -Very well controlled A1c on last labs, check updated labs.  Denies hypoglycemia but would be cautious on continue same dose of Lantus depending on A1c.  Overall feels well, did not want to change medicines at this time.  Hypercholesterolemia - Plan: pravastatin (PRAVACHOL) 80 MG tablet  -Tolerating current dose pravastatin along with Zetia,.  Continue same.  Continue follow-up with cardiology as planned.  Tobacco use Decreased use as above, commended on her efforts.  Handout given on steps to quitting smoking.  Stressors appear to be triggers, has psychiatrist and is trying to meet with group outpatient counseling.  Advised to let me know if we can help further.  Hypothyroidism due to acquired  atrophy of thyroid - Plan: TSH  -Check TSH on new dose of Synthroid.   Meds ordered this encounter  Medications   ezetimibe (ZETIA) 10 MG tablet    Sig: Take 1 tablet (10 mg total) by mouth daily.    Dispense:  90 tablet    Refill:  0   fluticasone (FLOVENT HFA) 44 MCG/ACT inhaler    Sig: Inhale 1-2 puffs into the lungs in the morning and at bedtime.    Dispense:  1 each    Refill:  12   insulin glargine (LANTUS SOLOSTAR) 100 UNIT/ML Solostar Pen    Sig: Inject 54 Units into the skin at bedtime.    Dispense:  30 mL    Refill:  1    DX Code Needed  .   pravastatin (PRAVACHOL) 80 MG tablet    Sig: Take 1 tablet (80 mg total) by mouth daily. TAKE 1 TABLET BY MOUTH EVERY DAY IN THE EVENING    Dispense:  90 tablet    Refill:  1   azithromycin (ZITHROMAX) 250 MG tablet    Sig: Take 2 tablets on day 1, then 1 tablet daily on days 2 through 5    Dispense:  6 tablet    Refill:  0   Patient Instructions  See info on quitting smoking below. If there are any barriers in quitting, please let me know and we can discuss options. Schedule a visit if that is needed.   Glad to hear that your congestion, headache, and nasal symptoms  have improved.  Saline or salt water nasal spray a few times per day can be helpful to prevent nosebleeds, see information below.  If those continue, return for recheck.  If cough does not continue to improve or any worsening chest congestion, have x-ray performed at the [facility] location and can fill prescription for antibiotic if needed at that time.  Follow-up if any persistent symptoms.  Lab visit in 2 weeks to evaluate your thyroid test and change in medications as well as blood sugar.  Monitor for any low blood sugars and if those occur we will need to decrease your insulin dose, no changes for now.  Follow-up with me in 4 months for recheck meds and labs.  I am happy to see you sooner if needed, including if any other conditions or questions that were not covered during today's visit.  Take care!  [facility] in 8:30-4:30 during weekdays, no appointment needed [place].  [place], [place] [id number]    Nosebleed, Adult A nosebleed is when blood comes out of the nose. Nosebleeds are common. Usually, they are not a sign of a serious condition. Nosebleeds can happen if a blood vessel in your nose starts to bleed or if the lining of your nose (mucous membrane) cracks. They are commonly caused by: Allergies. Colds. Picking your nose. Blowing your nose too hard. An injury from sticking an object into your nose or getting hit in the nose. Dry or cold air. Less common causes of nosebleeds include: Toxic fumes. Something abnormal in the nose or in the air-filled spaces in the bones of the face (sinuses). Growths in the nose, such as polyps. Blood thinners or conditions that cause blood to clot slowly. Certain illnesses or procedures that irritate or dry out the nasal passages. Follow these instructions at home: When you have a nosebleed:  Sit down and tilt your head slightly forward. Use a clean towel or tissue to pinch your  nostrils under the bony part of your nose. After 5  minutes, let go of your nose and see if bleeding starts again. Do not release pressure before that time. If there is still bleeding, repeat the pinching and holding for 5 minutes or until the bleeding stops. Do not place tissues or gauze in the nose to stop the bleeding. Avoid lying down and avoid tilting your head backward. That may make blood collect in the throat and cause gagging or coughing. Use a nasal spray decongestant to help with a nosebleed as told by your health care provider. After a nosebleed: Avoid blowing your nose or sniffing for a number of hours. Avoid straining, lifting, or bending at the waist for several days. You may go back to other normal activities as you are able. If you are taking aspirin or blood thinners and you have nosebleeds, talk to your health care provider. These medicines make bleeding more likely. Ask your health care provider if you should stop taking the medicines or if you should adjust the dose. Do not stop taking medicines that your health care provider has recommended unless he or she tells you to stop taking them. If your nosebleed was caused by dry mucous membranes, use over-the-counter saline nasal spray or gel and a humidifier as told by your health care provider. This will keep the mucous membranes moist and allow them to heal. If you need to use one of these products: Choose one that is water-soluble. Use only as much as you need and use it only as often as needed. Do not lie down right after you use it. If you get nosebleeds often, talk with your health care provider about medical treatments. Options may include: Nasal cautery. This treatment stops and prevents nosebleeds by using a chemical swab or electrical device to lightly burn tiny blood vessels inside the nose. Nasal packing. A gauze or other material is placed in the nose to keep constant pressure on the bleeding area. Contact a health care provider if you: Have a fever. Get nosebleeds  often or more often than usual. Bruise very easily. Have a nosebleed from having something stuck in your nose. Have bleeding in your mouth. Vomit or cough up brown material. Have a nosebleed after you start a new medicine. Get help right away if: You have a nosebleed after a fall or a head injury. Your nosebleed does not go away after 20 minutes. You feel dizzy or weak. You have unusual bleeding from other parts of your body. You have unusual bruising on other parts of your body. You become sweaty. You vomit blood. Summary A nosebleed is when blood comes out of the nose. Common causes include allergies, an injury to the nose, or cold or dry air. Initial treatment includes applying pressure for 5 minutes. Moisturizing the nose with saline nasal spray or gel after a nosebleed may help prevent future bleeding. Get help right away if your nosebleed does not go away after 20 minutes. This information is not intended to replace advice given to you by your health care provider. Make sure you discuss any questions you have with your health care provider. Document Revised: 09/14/2019 Document Reviewed: 09/14/2019 Elsevier Patient Education  2022 [place].    Steps to Quit Smoking Smoking tobacco is the leading cause of preventable death. It can affect almost every organ in the body. Smoking puts you and those around you at risk for developing many serious chronic diseases. Quitting smoking can be very challenging.  Do not get discouraged if you are not successful the first time. Some people need to make many attempts to quit before they achieve long-term success. Do your best to stick to your quit plan, and talk with your health care provider if you have any questions or concerns. How do I get ready to quit? When you decide to quit smoking, create a plan to help you succeed. Before you quit: Pick a date to quit. Set a date within the next 2 weeks to give you time to prepare. Write down the  reasons why you are quitting. Keep this list in places where you will see it often. Tell your family, friends, and co-workers that you are quitting. Support from people you are close to can make quitting easier. Talk with your health care provider about your options for quitting smoking. Find out what treatment options are covered by your health insurance. Identify people, places, things, and activities that make you want to smoke (triggers). Avoid them. What first steps can I take to quit smoking? Throw away all cigarettes at home, at work, and in your car. Throw away smoking accessories, such as Scientist, research (medical). Clean your car. Make sure to empty the ashtray. Clean your home, including curtains and carpets. What strategies can I use to quit smoking? Talk with your health care provider about combining strategies, such as taking medicines while you are also receiving in-person counseling. Using these two strategies together makes you more likely to succeed in quitting than if you used either strategy on its own. If you are pregnant or breastfeeding, talk with your health care provider about finding counseling or other support strategies to quit smoking. Do not take medicine to help you quit smoking unless your health care provider tells you to. Quit right away Quit smoking completely, instead of gradually reducing how much you smoke over a period of time. Stopping smoking right away may be more successful than gradually quitting. Attend in-person counseling to help you build problem-solving skills. You are more likely to succeed in quitting if you attend counseling sessions regularly. Even short sessions of 10 minutes can be effective. Take medicine You may take medicines to help you quit smoking. Some medicines require a prescription. You can also purchase over-the-counter medicines. Medicines may have nicotine in them to replace the nicotine in cigarettes. Medicines may: Help to stop  cravings. Help to relieve withdrawal symptoms. Your health care provider may recommend: Nicotine patches, gum, or lozenges. Nicotine inhalers or sprays. Non-nicotine medicine that you take by mouth. Find resources Find resources and support systems that can help you quit smoking and remain smoke-free after you quit. These resources are most helpful when you use them often. They include: Online chats with a Social worker. Telephone quitlines. Printed Furniture conservator/restorer. Support groups or group counseling. Text messaging programs. Mobile phone apps or applications. Use apps that can help you stick to your quit plan by providing reminders, tips, and encouragement. Examples of free services include Quit Guide from the CDC and smokefree.gov  What can I do to make it easier to quit?  Reach out to your family and friends for support and encouragement. Call telephone quitlines, such as 1-800-QUIT-NOW, reach out to support groups, or work with a counselor for support. Ask people who smoke to avoid smoking around you. Avoid places that trigger you to smoke, such as bars, parties, or smoke-break areas at work. Spend time with people who do not smoke. Lessen the stress in your life. Stress can  be a smoking trigger for some people. To lessen stress, try: Exercising regularly. Doing deep-breathing exercises. Doing yoga. Meditating. What benefits will I see if I quit smoking? Over time, you should start to see positive results, such as: Improved sense of smell and taste. Decreased coughing and sore throat. Slower heart rate. Lower blood pressure. Clearer and healthier skin. The ability to breathe more easily. Fewer sick days. Summary Quitting smoking can be very challenging. Do not get discouraged if you are not successful the first time. Some people need to make many attempts to quit before they achieve long-term success. When you decide to quit smoking, create a plan to help you succeed. Quit  smoking right away, not slowly over a period of time. Find resources and support systems that can help you quit smoking and remain smoke-free after you quit. This information is not intended to replace advice given to you by your health care provider. Make sure you discuss any questions you have with your health care provider. Document Revised: 11/07/2021 Document Reviewed: 11/07/2021 Elsevier Patient Education  2023 [place],   Merri Ray, MD [facility] 10/28/22 3:39 PM

## 2022-10-28 NOTE — Patient Instructions (Addendum)
See info on quitting smoking below. If there are any barriers in quitting, please let me know and we can discuss options. Schedule a visit if that is needed.   Glad to hear that your congestion, headache, and nasal symptoms have improved.  Saline or salt water nasal spray a few times per day can be helpful to prevent nosebleeds, see information below.  If those continue, return for recheck.  If cough does not continue to improve or any worsening chest congestion, have x-ray performed at the [facility] location and can fill prescription for antibiotic if needed at that time.  Follow-up if any persistent symptoms.  Lab visit in 2 weeks to evaluate your thyroid test and change in medications as well as blood sugar.  Monitor for any low blood sugars and if those occur we will need to decrease your insulin dose, no changes for now.  Follow-up with me in 4 months for recheck meds and labs.  I am happy to see you sooner if needed, including if any other conditions or questions that were not covered during today's visit.  Take care!  [facility] 8:30-4:30 during weekdays, no appointment needed [address]  [place], [place] [address]    Nosebleed, Adult A nosebleed is when blood comes out of the nose. Nosebleeds are common. Usually, they are not a sign of a serious condition. Nosebleeds can happen if a blood vessel in your nose starts to bleed or if the lining of your nose (mucous membrane) cracks. They are commonly caused by: Allergies. Colds. Picking your nose. Blowing your nose too hard. An injury from sticking an object into your nose or getting hit in the nose. Dry or cold air. Less common causes of nosebleeds include: Toxic fumes. Something abnormal in the nose or in the air-filled spaces in the bones of the face (sinuses). Growths in the nose, such as polyps. Blood thinners or conditions that cause blood to clot slowly. Certain illnesses or procedures that irritate or dry out the  nasal passages. Follow these instructions at home: When you have a nosebleed:  Sit down and tilt your head slightly forward. Use a clean towel or tissue to pinch your nostrils under the bony part of your nose. After 5 minutes, let go of your nose and see if bleeding starts again. Do not release pressure before that time. If there is still bleeding, repeat the pinching and holding for 5 minutes or until the bleeding stops. Do not place tissues or gauze in the nose to stop the bleeding. Avoid lying down and avoid tilting your head backward. That may make blood collect in the throat and cause gagging or coughing. Use a nasal spray decongestant to help with a nosebleed as told by your health care provider. After a nosebleed: Avoid blowing your nose or sniffing for a number of hours. Avoid straining, lifting, or bending at the waist for several days. You may go back to other normal activities as you are able. If you are taking aspirin or blood thinners and you have nosebleeds, talk to your health care provider. These medicines make bleeding more likely. Ask your health care provider if you should stop taking the medicines or if you should adjust the dose. Do not stop taking medicines that your health care provider has recommended unless he or she tells you to stop taking them. If your nosebleed was caused by dry mucous membranes, use over-the-counter saline nasal spray or gel and a humidifier as told  by your health care provider. This will keep the mucous membranes moist and allow them to heal. If you need to use one of these products: Choose one that is water-soluble. Use only as much as you need and use it only as often as needed. Do not lie down right after you use it. If you get nosebleeds often, talk with your health care provider about medical treatments. Options may include: Nasal cautery. This treatment stops and prevents nosebleeds by using a chemical swab or electrical device to lightly burn  tiny blood vessels inside the nose. Nasal packing. A gauze or other material is placed in the nose to keep constant pressure on the bleeding area. Contact a health care provider if you: Have a fever. Get nosebleeds often or more often than usual. Bruise very easily. Have a nosebleed from having something stuck in your nose. Have bleeding in your mouth. Vomit or cough up brown material. Have a nosebleed after you start a new medicine. Get help right away if: You have a nosebleed after a fall or a head injury. Your nosebleed does not go away after 20 minutes. You feel dizzy or weak. You have unusual bleeding from other parts of your body. You have unusual bruising on other parts of your body. You become sweaty. You vomit blood. Summary A nosebleed is when blood comes out of the nose. Common causes include allergies, an injury to the nose, or cold or dry air. Initial treatment includes applying pressure for 5 minutes. Moisturizing the nose with saline nasal spray or gel after a nosebleed may help prevent future bleeding. Get help right away if your nosebleed does not go away after 20 minutes. This information is not intended to replace advice given to you by your health care provider. Make sure you discuss any questions you have with your health care provider. Document Revised: 09/14/2019 Document Reviewed: 09/14/2019 Elsevier Patient Education  2022 Elsevier Inc[employer]    Steps to Quit Smoking Smoking tobacco is the leading cause of preventable death. It can affect almost every organ in the body. Smoking puts you and those around you at risk for developing many serious chronic diseases. Quitting smoking can be very challenging. Do not get discouraged if you are not successful the first time. Some people need to make many attempts to quit before they achieve long-term success. Do your best to stick to your quit plan, and talk with your health care provider if you have any questions or  concerns. How do I get ready to quit? When you decide to quit smoking, create a plan to help you succeed. Before you quit: Pick a date to quit. Set a date within the next 2 weeks to give you time to prepare. Write down the reasons why you are quitting. Keep this list in places where you will see it often. Tell your family, friends, and co-workers that you are quitting. Support from people you are close to can make quitting easier. Talk with your health care provider about your options for quitting smoking. Find out what treatment options are covered by your health insurance. Identify people, places, things, and activities that make you want to smoke (triggers). Avoid them. What first steps can I take to quit smoking? Throw away all cigarettes at home, at work, and in your car. Throw away smoking accessories, such as Set designer. Clean your car. Make sure to empty the ashtray. Clean your home, including curtains and carpets. What strategies can I use to quit  smoking? Talk with your health care provider about combining strategies, such as taking medicines while you are also receiving in-person counseling. Using these two strategies together makes you more likely to succeed in quitting than if you used either strategy on its own. If you are pregnant or breastfeeding, talk with your health care provider about finding counseling or other support strategies to quit smoking. Do not take medicine to help you quit smoking unless your health care provider tells you to. Quit right away Quit smoking completely, instead of gradually reducing how much you smoke over a period of time. Stopping smoking right away may be more successful than gradually quitting. Attend in-person counseling to help you build problem-solving skills. You are more likely to succeed in quitting if you attend counseling sessions regularly. Even short sessions of 10 minutes can be effective. Take medicine You may take  medicines to help you quit smoking. Some medicines require a prescription. You can also purchase over-the-counter medicines. Medicines may have nicotine in them to replace the nicotine in cigarettes. Medicines may: Help to stop cravings. Help to relieve withdrawal symptoms. Your health care provider may recommend: Nicotine patches, gum, or lozenges. Nicotine inhalers or sprays. Non-nicotine medicine that you take by mouth. Find resources Find resources and support systems that can help you quit smoking and remain smoke-free after you quit. These resources are most helpful when you use them often. They include: Online chats with a Veterinary surgeon. Telephone quitlines. Printed Materials engineer. Support groups or group counseling. Text messaging programs. Mobile phone apps or applications. Use apps that can help you stick to your quit plan by providing reminders, tips, and encouragement. Examples of free services include Quit Guide from the CDC and smokefree.gov  What can I do to make it easier to quit?  Reach out to your family and friends for support and encouragement. Call telephone quitlines, such as 1-800-QUIT-NOW, reach out to support groups, or work with a counselor for support. Ask people who smoke to avoid smoking around you. Avoid places that trigger you to smoke, such as bars, parties, or smoke-break areas at work. Spend time with people who do not smoke. Lessen the stress in your life. Stress can be a smoking trigger for some people. To lessen stress, try: Exercising regularly. Doing deep-breathing exercises. Doing yoga. Meditating. What benefits will I see if I quit smoking? Over time, you should start to see positive results, such as: Improved sense of smell and taste. Decreased coughing and sore throat. Slower heart rate. Lower blood pressure. Clearer and healthier skin. The ability to breathe more easily. Fewer sick days. Summary Quitting smoking can be very  challenging. Do not get discouraged if you are not successful the first time. Some people need to make many attempts to quit before they achieve long-term success. When you decide to quit smoking, create a plan to help you succeed. Quit smoking right away, not slowly over a period of time. Find resources and support systems that can help you quit smoking and remain smoke-free after you quit. This information is not intended to replace advice given to you by your health care provider. Make sure you discuss any questions you have with your health care provider. Document Revised: 11/07/2021 Document Reviewed: 11/07/2021 Elsevier Patient Education  2023 [employer].

## 2022-11-11 ENCOUNTER — Other Ambulatory Visit: Payer: Medicare Other

## 2022-11-13 ENCOUNTER — Other Ambulatory Visit: Payer: Medicare Other

## 2022-11-13 ENCOUNTER — Other Ambulatory Visit (INDEPENDENT_AMBULATORY_CARE_PROVIDER_SITE_OTHER): Payer: Medicare Other

## 2022-11-13 DIAGNOSIS — R809 Proteinuria, unspecified: Secondary | ICD-10-CM | POA: Diagnosis not present

## 2022-11-13 DIAGNOSIS — E1129 Type 2 diabetes mellitus with other diabetic kidney complication: Secondary | ICD-10-CM

## 2022-11-13 DIAGNOSIS — E034 Atrophy of thyroid (acquired): Secondary | ICD-10-CM

## 2022-11-13 DIAGNOSIS — Z794 Long term (current) use of insulin: Secondary | ICD-10-CM | POA: Diagnosis not present

## 2022-11-13 LAB — MICROALBUMIN / CREATININE URINE RATIO
Creatinine,U: 162.7 mg/dL
Microalb Creat Ratio: 1.1 mg/g (ref 0.0–30.0)
Microalb, Ur: 1.8 mg/dL (ref 0.0–1.9)

## 2022-11-13 LAB — BASIC METABOLIC PANEL
BUN: 21 mg/dL (ref 6–23)
CO2: 23 mEq/L (ref 19–32)
Calcium: 9.5 mg/dL (ref 8.4–10.5)
Chloride: 106 mEq/L (ref 96–112)
Creatinine, Ser: 1.5 mg/dL — ABNORMAL HIGH (ref 0.40–1.20)
GFR: 35.73 mL/min — ABNORMAL LOW (ref >60.00)
Glucose, Bld: 122 mg/dL — ABNORMAL HIGH (ref 70–99)
Potassium: 3.7 mEq/L (ref 3.5–5.1)
Sodium: 139 mEq/L (ref 135–145)

## 2022-11-13 LAB — HEMOGLOBIN A1C: Hgb A1c MFr Bld: 5.4 % (ref 4.6–6.5)

## 2022-11-13 LAB — TSH: TSH: 0.01 u[IU]/mL — ABNORMAL LOW (ref 0.35–5.50)

## 2022-11-17 ENCOUNTER — Other Ambulatory Visit: Payer: Self-pay | Admitting: Family Medicine

## 2022-11-17 DIAGNOSIS — R7989 Other specified abnormal findings of blood chemistry: Secondary | ICD-10-CM

## 2022-11-17 DIAGNOSIS — E034 Atrophy of thyroid (acquired): Secondary | ICD-10-CM

## 2022-11-17 MED ORDER — LEVOTHYROXINE SODIUM 75 MCG PO TABS: 75.0000 ug | ORAL_TABLET | Freq: Every day | ORAL | 3 refills | Status: DC

## 2022-11-17 NOTE — Progress Notes (Signed)
See lab notes

## 2022-12-03 ENCOUNTER — Other Ambulatory Visit: Payer: Self-pay

## 2022-12-03 MED ORDER — SACUBITRIL-VALSARTAN 97-103 MG PO TABS: 1.0000 | ORAL_TABLET | Freq: Two times a day (BID) | ORAL | 3 refills | Status: DC

## 2022-12-09 ENCOUNTER — Telehealth: Payer: Self-pay | Admitting: Family Medicine

## 2022-12-09 NOTE — Telephone Encounter (Signed)
Just wanted to give you some information on this one. She called in to make sure she hadn't missed an appt, looking back at her last visit she was supposed to come back in February. She mentioned getting blood work done but never hearing the results of it. I looked at her last labs and gave her those results, there was also some lab visits that needed to be scheduled that never were. In the middle of attempting to get those scheduled she mentioned that she had an incident over christmas that she thought could have been a stroke. She did not go to the ED, she states she did not want to get sick, she is ok now and everything is normal. I told her to go ahead and let's get an appt scheduled to discuss that and we will see if Felicia Hanson wants to repeat blood work then or have her come back, but felt this needed to be addressed sooner and we could start from there. She was very appreciative as she stated she didn't want to mention this incident because she didn't want to cause Felicia Hanson any trouble, I assured her it was no trouble!

## 2022-12-09 NOTE — Telephone Encounter (Signed)
Thanks for letting me know.  No trouble at all.  I am concerned about her and her symptoms over Christmas.  I think we should meet to talk about this further to make sure it was not something like a TIA since it sounds like she is back to her normal self.  Would also want a make sure she did not have any low blood sugars at that time as that was a possible concern at her November 29 visit.  Recheck kidney function test soon, thyroid test can wait for a few weeks if she has been on the new medication.  I am happy to see her in the office in the next week or virtual visit to discuss the symptoms.   Thanks.

## 2022-12-10 NOTE — Telephone Encounter (Signed)
Pt scheduled for 1/15 with provider.

## 2022-12-14 ENCOUNTER — Ambulatory Visit (INDEPENDENT_AMBULATORY_CARE_PROVIDER_SITE_OTHER): Payer: Medicare Other | Admitting: Family Medicine

## 2022-12-14 VITALS — BP 134/76 | HR 87 | Ht 60.0 in | Wt 141.7 lb

## 2022-12-14 DIAGNOSIS — M6281 Muscle weakness (generalized): Secondary | ICD-10-CM

## 2022-12-14 DIAGNOSIS — R7989 Other specified abnormal findings of blood chemistry: Secondary | ICD-10-CM

## 2022-12-14 DIAGNOSIS — R55 Syncope and collapse: Secondary | ICD-10-CM

## 2022-12-14 DIAGNOSIS — R4789 Other speech disturbances: Secondary | ICD-10-CM

## 2022-12-14 DIAGNOSIS — E034 Atrophy of thyroid (acquired): Secondary | ICD-10-CM

## 2022-12-14 NOTE — Patient Instructions (Addendum)
Please have labs performed at [facility].  I will refer you to neurology to discuss symptoms from last month and to decide on other testing or imaging if needed.  If any return of similar symptoms, be seen in emergency room as we discussed.   [facility] in 8:30-4:30 during weekdays, no appointment needed [address].  [place],  [id number]

## 2022-12-14 NOTE — Progress Notes (Unsigned)
Subjective:  Patient ID: Felicia Hanson, female    DOB: 11-24-1956  Age: 67 y.o. MRN: [id number]  CC:  Chief Complaint  Patient presents with   Stress    Pt just before the holidays was dehydrated and noted had so much to do and thinks this may be part of the reason for the incident, felt like her spine had turned to rubber, notes she sat down and was unable to get back up, slurred speech at the time of incident, 3 hours later speech was back to normal denied ambulance and emergency services chose to wait for Dr Appt.     HPI Felicia Hanson presents for   See recent telephone note.  Concern regarding episode of slurred speech, concern for symptoms of a stroke around Christmas time. Here to discuss those symptoms.  On 11/23/22 - had been staying up all night  11/22/22 to wrap presents and early Christmas morning. Went to bed at 8am, slept for 4 hours.  Other grandchildren arrived around 4 hours later. Stood up to get gifts, fell backwards, no LOC, fell back onto table and water bottle felt in back. Spine felt like rubber. Not able to sit up - spine felt like rubber, but no arm or leg weakness.  Able to speak, but unable to put thoughts into speech, but not slurred. Able to walk once assisted up, but unsteady - and trouble walking  - no leg weakness, but felt unsteady. Sat down on chair. Spine turned to rubber again. Fell back again. Unable to hold self up, able to walk once assisted up. Still having trouble forming words. Felt like back to normal after 3 hours, Able to speak normally, walk normally and back felt back to normal after 3 hours.  No associated chest pain or palpitations.  No medical eval.  Did not check blood sugar at that time.  No recent low blood sugars - always over 100.  Dehydrated for 3 days before Christmas - few days that week. d Increased creatinine 11/13/22, 1.50 up from 1.37 prior. Plan for repeat labs, has not rechecked.  TSH 0.01 on 12/15., lowered dose of Synthroid  to 34mcg QD - lower dose past few weeks. Concerned that hair falling out.  Has been feeling ok since 11/23/22.  Headaches a few days prior to episode above.  History Patient Active Problem List   Diagnosis Date Noted   Benign essential tremor 05/25/2022   Shortness of breath at rest 05/09/2021   Loud snoring 03/24/2021   Excessive daytime sleepiness 03/24/2021   Acute on chronic combined systolic and diastolic HF (heart failure) ([place]) 03/24/2021   Panlobular emphysema ([place]) 03/24/2021   GAD (generalized anxiety disorder) 03/24/2021   Atypical chest pain 01/22/2021   Respiratory distress 01/20/2021   Acute on chronic combined systolic and diastolic CHF (congestive heart failure) ([place]) 01/20/2021   Type 2 diabetes mellitus without complication, without long-term current use of insulin ([place]) 10/26/2017   Generalized anxiety disorder 01/10/2014   Vitamin D deficiency 01/10/2014   Essential hypertension, benign 01/10/2014   Pure hypercholesterolemia 01/10/2014   Hypothyroidism 01/10/2014   Past Medical History:  Diagnosis Date   Allergy    Anxiety    CHF (congestive heart failure) ([place]) 01/20/2021   COPD (chronic obstructive pulmonary disease) ([place])    Coronary artery calcification    Depression    Diabetes mellitus without complication ([place])    Diverticulitis    with colon resection   GERD (gastroesophageal reflux disease)  on pepcid   Hyperlipidemia    Hypertension    Sleep apnea    cpap on order 01-01-22   Sleep paralysis    will be tested for narcolepsy per pt 01-01-22   Thyroid disease    Past Surgical History:  Procedure Laterality Date   BREAST EXCISIONAL BIOPSY Left    CESAREAN SECTION     x2   CHOLECYSTECTOMY     COLON SURGERY     diverticulitis with perforation; s/p colon resection.   Colonoscopy     COLONOSCOPY     DENTAL SURGERY     RIGHT/LEFT HEART CATH AND CORONARY ANGIOGRAPHY N/A 01/22/2021   Procedure: RIGHT/LEFT HEART CATH AND CORONARY ANGIOGRAPHY;   Surgeon: Yates Decamp, MD;  Location: [facility];  Service: Cardiovascular;  Laterality: N/A;   TUBAL LIGATION     Allergies  Allergen Reactions   Metformin And Related Other (See Comments)    Could not swallow   Nicotine Rash    Only allergic to nicotine patch   Ace Inhibitors Cough    Cough with lisinopril.    Prior to Admission medications   Medication Sig Start Date End Date Taking? Authorizing Provider  ACCU-CHEK AVIVA PLUS test strip daily. for testing 03/11/18   [provider]  albuterol (VENTOLIN HFA) 108 (90 Base) MCG/ACT inhaler Inhale 1 puff into the lungs every 6 (six) hours as needed for wheezing or shortness of breath.    [provider]  ALPRAZolam Prudy Feeler) 1 MG tablet Take 0.5 mg by mouth 3 (three) times daily as needed for anxiety or sleep.  08/21/13   [provider]  aspirin 81 MG tablet Take 81 mg by mouth daily.    [provider]  Azelastine HCl 137 MCG/SPRAY SOLN PLACE 1 SPRAY INTO BOTH NOSTRILS 2 (TWO) TIMES DAILY. USE IN EACH NOSTRIL AS DIRECTED IF RUNNY NOSE/DRAINAGE. Patient not taking: Reported on 10/28/2022 05/26/22   Shade Flood, MD  BLACK ELDERBERRY PO Take 10 mLs by mouth daily.    [provider]  cetirizine (ZYRTEC) 10 MG tablet Take 10 mg by mouth daily.    [provider]  Continuous Blood Gluc Receiver (DEXCOM G6 RECEIVER) DEVI 1 Device by Does not apply route every morning. Device and supplies Patient not taking: Reported on 06/17/2022 04/18/20   Shade Flood, MD  dapagliflozin propanediol (FARXIGA) 10 MG TABS tablet Take 1 tablet (10 mg total) by mouth daily. 04/30/22   Tolia, Sunit, DO  Dulaglutide (TRULICITY) 1.5 MG/0.5ML SOPN INJECT 1.5 MG (0.5ML) UNDER THE SKIN ONCE A WEEK 10/20/22   Shade Flood, MD  DULoxetine (CYMBALTA) 60 MG capsule Take 2 capsules (120 mg total) by mouth daily. 05/14/22   Shade Flood, MD  ezetimibe (ZETIA) 10 MG tablet Take 1 tablet (10 mg total) by  mouth daily. 10/28/22   Shade Flood, MD  famotidine (PEPCID) 20 MG tablet Take 20 mg by mouth daily as needed for heartburn or indigestion.    [provider]  fenofibrate (TRICOR) 145 MG tablet Take 1 tablet (145 mg total) by mouth daily. 09/28/22 12/27/22  Tolia, Sunit, DO  fluticasone (FLONASE) 50 MCG/ACT nasal spray Place 2 sprays into both nostrils daily. Patient not taking: Reported on 10/28/2022 03/15/22   Leath-Warren, Sadie Haber, NP  fluticasone (FLOVENT HFA) 44 MCG/ACT inhaler Inhale 1-2 puffs into the lungs in the morning and at bedtime. 10/28/22   Shade Flood, MD  insulin glargine (LANTUS SOLOSTAR) 100 UNIT/ML  Solostar Pen Inject 54 Units into the skin at bedtime. 10/28/22   Wendie Agreste, MD  Insulin Pen Needle 31G X 5 MM MISC Inject 1 each into the skin daily. 02/10/21   Wendie Agreste, MD  levothyroxine (SYNTHROID) 75 MCG tablet Take 1 tablet (75 mcg total) by mouth daily. 11/17/22   Wendie Agreste, MD  magnesium oxide (MAG-OX) 400 (240 Mg) MG tablet TAKE 0.5 TABLETS (200 MG TOTAL) BY MOUTH 2 TIMES DAILY. 01/06/22   Tolia, Sunit, DO  metoprolol succinate (TOPROL-XL) 100 MG 24 hr tablet TAKE 1 TABLET BY MOUTH EVERY DAY WITH OR IMMEDIATELY FOLLOWING A MEAL 10/26/22   Tolia, Sunit, DO  nirmatrelvir & ritonavir (PAXLOVID, 150/100,) 10 x 150 MG & 10 x 100MG  TBPK Take 150 mg nirmatrelvir and 100 mg ritonavir PO BID for 5 days Patient not taking: Reported on 10/28/2022 08/30/22   Francene Finders, PA-C  nitroGLYCERIN (NITROSTAT) 0.4 MG SL tablet PLACE 1 TABLET UNDER THE TONGUE EVERY 5 MINUTES AS NEEDED FOR CHEST PAIN. IF YOU REQUIRE MORE THAN TWO TABLETS FIVE MINUTES APART GO TO THE NEAREST ER VIA EMS. Patient taking differently: Place 0.4 mg under the tongue every 5 (five) minutes as needed for chest pain. 07/29/20   Tolia, Sunit, DO  omeprazole (PRILOSEC) 20 MG capsule TAKE 1 CAPSULE BY MOUTH EVERY DAY Patient not taking: Reported on 10/28/2022 06/14/22   Wendie Agreste, MD  ondansetron (ZOFRAN-ODT) 4 MG disintegrating tablet Take 1 tablet (4 mg total) by mouth every 8 (eight) hours as needed. Patient not taking: Reported on 10/28/2022 02/01/22   Francene Finders, PA-C  pravastatin (PRAVACHOL) 80 MG tablet Take 1 tablet (80 mg total) by mouth daily. TAKE 1 TABLET BY MOUTH EVERY DAY IN THE EVENING 10/28/22   Wendie Agreste, MD  predniSONE (DELTASONE) 20 MG tablet Take 2 tablets (40 mg total) by mouth daily with breakfast. Patient not taking: Reported on 09/28/2022 03/26/22   Wendie Agreste, MD  promethazine-dextromethorphan (PROMETHAZINE-DM) 6.25-15 MG/5ML syrup Take 5 mLs by mouth 4 (four) times daily as needed for cough. Patient not taking: Reported on 09/28/2022 03/15/22   Leath-Warren, Alda Lea, NP  sacubitril-valsartan (ENTRESTO) 97-103 MG Take 1 tablet by mouth 2 (two) times daily. 12/03/22   Tolia, Sunit, DO  spironolactone (ALDACTONE) 25 MG tablet TAKE 1 TABLET BY MOUTH EVERY DAY 10/05/22   Wendie Agreste, MD  topiramate (TOPAMAX) 50 MG tablet Take 1 tablet (50 mg total) by mouth 2 (two) times daily. 08/05/22   Wendie Agreste, MD  Vitamin D, Ergocalciferol, (DRISDOL) 1.25 MG (50000 UNIT) CAPS capsule TAKE 1 CAPSULE (50,000 UNITS TOTAL) BY MOUTH EVERY 7 (SEVEN) DAYS 09/14/22   Wendie Agreste, MD   Social History   Socioeconomic History   Marital status: Married    Spouse name: Not on file   Number of children: 2   Years of education: some college   Highest education level: Not on file  Occupational History   Occupation: Retired  Tobacco Use   Smoking status: Every Day    Packs/day: 0.25    Years: 38.00    Total pack years: 9.50    Types: Cigarettes   Smokeless tobacco: Never   Tobacco comments:    2-5 A DAY  Vaping Use   Vaping Use: Never used  Substance and Sexual Activity   Alcohol use: No    Alcohol/week: 0.0 standard drinks of alcohol   Drug use: No   Sexual  activity: Not Currently  Other Topics Concern   Not on  file  Social History Narrative   Marital status: married      Children:  2 children; 4 grandchildren      Lives: with husband, 2 granddaughters      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   Left-handed.   Caffeine use: 2.5 cups caffeine per day.   Social Determinants of Health   Financial Resource Strain: Low Risk  (06/17/2022)   Overall Financial Resource Strain (CARDIA)    Difficulty of Paying Living Expenses: Not hard at all  Food Insecurity: No Food Insecurity (06/17/2022)   Hunger Vital Sign    Worried About Running Out of Food in the Last Year: Never true    Ran Out of Food in the Last Year: Never true  Transportation Needs: No Transportation Needs (06/17/2022)   PRAPARE - Hydrologist (Medical): No    Lack of Transportation (Non-Medical): No  Physical Activity: Insufficiently Active (06/17/2022)   Exercise Vital Sign    Days of Exercise per Week: 3 days    Minutes of Exercise per Session: 30 min  Stress: No Stress Concern Present (06/17/2022)   [place]    Feeling of Stress : Not at all  Social Connections: Moderately Integrated (06/17/2022)   Social Connection and Isolation Panel [NHANES]    Frequency of Communication with Friends and Family: Three times a week    Frequency of Social Gatherings with Friends and Family: Three times a week    Attends Religious Services: More than 4 times per year    Active Member of Clubs or Organizations: No    Attends Archivist Meetings: Never    Marital Status: Married  Human resources officer Violence: Not At Risk (06/17/2022)   Humiliation, Afraid, Rape, and Kick questionnaire    Fear of Current or Ex-Partner: No    Emotionally Abused: No    Physically Abused: No    Sexually Abused: No    Review of Systems Per HPI  Objective:   Vitals:   12/15/22 0822  BP: 134/76   Pulse: 87  SpO2: 98%  Weight: 141 lb 11.2 oz (64.3 kg)  Height: 5' (1.524 m)     Physical Exam Vitals reviewed.        Assessment & Plan:  Felicia Hanson is a 67 y.o. female . Collapse - Plan: Ambulatory referral to Neurology, CBC  Truncal muscle weakness - Plan: Ambulatory referral to Neurology, CBC  Hypothyroidism due to acquired atrophy of thyroid  Elevated serum creatinine  Episode of change in speech - Plan: Ambulatory referral to Neurology, CBC  Symptoms occurred December 24 through 25.  Initial episode of collapse without true syncope.  Could certainly have been related to fatigue with decreased sleep.  Denies hypoglycemia but that is also in differential.  During his episodes reported truncal weakness but not focal weakness of arms or legs, reports more spine weakness.  Also had some associated speech changes as above without slurred speech but difficulty forming speech.  Symptoms resolved after a few hours, denies recurrence.  Question hypoglycemia versus fatigue versus TIA.   Did have previous elevating creatinine, will check that level as well as overtreated hypothyroidism, has started new dose, updated testing ordered.  Will refer to neurology to discuss these symptoms further  and to discuss need for other testing or imaging.  Nonfocal exam at present. ER/RTC precautions discussed.  No orders of the defined types were placed in this encounter.  Patient Instructions  Please have labs performed at [facility].  I will refer you to neurology to discuss symptoms from last month and to decide on other testing or imaging if needed.  If any return of similar symptoms, be seen in emergency room as we discussed.   [facility] in 8:30-4:30 during weekdays, no appointment needed [place].  [place], [place] [address]         Signed,   Merri Ray, MD [place] 12/14/22 5:47 PM

## 2022-12-15 ENCOUNTER — Encounter: Payer: Self-pay | Admitting: Family Medicine

## 2022-12-16 ENCOUNTER — Other Ambulatory Visit (INDEPENDENT_AMBULATORY_CARE_PROVIDER_SITE_OTHER): Payer: Medicare Other

## 2022-12-16 DIAGNOSIS — R4789 Other speech disturbances: Secondary | ICD-10-CM | POA: Diagnosis not present

## 2022-12-16 DIAGNOSIS — R7989 Other specified abnormal findings of blood chemistry: Secondary | ICD-10-CM

## 2022-12-16 DIAGNOSIS — M6281 Muscle weakness (generalized): Secondary | ICD-10-CM | POA: Diagnosis not present

## 2022-12-16 DIAGNOSIS — E034 Atrophy of thyroid (acquired): Secondary | ICD-10-CM | POA: Diagnosis not present

## 2022-12-16 DIAGNOSIS — R55 Syncope and collapse: Secondary | ICD-10-CM

## 2022-12-16 LAB — BASIC METABOLIC PANEL
BUN: 14 mg/dL (ref 6–23)
CO2: 25 mEq/L (ref 19–32)
Calcium: 9.3 mg/dL (ref 8.4–10.5)
Chloride: 107 mEq/L (ref 96–112)
Creatinine, Ser: 1.43 mg/dL — ABNORMAL HIGH (ref 0.40–1.20)
GFR: 37.82 mL/min — ABNORMAL LOW (ref >60.00)
Glucose, Bld: 108 mg/dL — ABNORMAL HIGH (ref 70–99)
Potassium: 4.8 mEq/L (ref 3.5–5.1)
Sodium: 141 mEq/L (ref 135–145)

## 2022-12-16 LAB — TSH: TSH: 0.03 u[IU]/mL — ABNORMAL LOW (ref 0.35–5.50)

## 2022-12-16 LAB — CBC
HCT: 43.4 % (ref 36.0–46.0)
Hemoglobin: 14.6 g/dL (ref 12.0–15.0)
MCHC: 33.7 g/dL (ref 30.0–36.0)
MCV: 88.2 fl (ref 78.0–100.0)
Platelets: 237 10*3/uL (ref 150.0–400.0)
RBC: 4.91 Mil/uL (ref 3.87–5.11)
RDW: 13.2 % (ref 11.5–15.5)
WBC: 10.5 10*3/uL (ref 4.0–10.5)

## 2022-12-22 ENCOUNTER — Other Ambulatory Visit: Payer: Self-pay | Admitting: Family Medicine

## 2022-12-22 DIAGNOSIS — E034 Atrophy of thyroid (acquired): Secondary | ICD-10-CM

## 2022-12-22 MED ORDER — LEVOTHYROXINE SODIUM 50 MCG PO TABS: 50.0000 ug | ORAL_TABLET | Freq: Every day | ORAL | 1 refills | Status: DC

## 2022-12-22 NOTE — Progress Notes (Signed)
See lab notes

## 2022-12-23 ENCOUNTER — Telehealth: Payer: Self-pay

## 2022-12-23 NOTE — Telephone Encounter (Signed)
-----  Message from Wendie Agreste, MD sent at 12/22/2022  4:27 PM EST ----- Please call patient with results. Blood counts are normal.  Kidney function tests overall stable from previous readings.  Blood sugar only a few points elevated, overall electrolytes looked okay.  Unfortunately thyroid test was still low.  Decrease thyroid medication to 50 mcg once per day for now with recheck levels in the next few weeks.  Let me know if she has questions.

## 2022-12-23 NOTE — Telephone Encounter (Signed)
Pt called Felicia Hanson back at [phone]

## 2022-12-23 NOTE — Telephone Encounter (Signed)
Pt was called back and made lab appt .

## 2023-01-06 ENCOUNTER — Ambulatory Visit (INDEPENDENT_AMBULATORY_CARE_PROVIDER_SITE_OTHER): Payer: Medicare Other | Admitting: Family Medicine

## 2023-01-06 ENCOUNTER — Encounter: Payer: Self-pay | Admitting: Family Medicine

## 2023-01-06 VITALS — BP 128/70 | HR 76 | Temp 98.4°F | Ht 60.0 in | Wt 147.8 lb

## 2023-01-06 DIAGNOSIS — B349 Viral infection, unspecified: Secondary | ICD-10-CM | POA: Diagnosis not present

## 2023-01-06 DIAGNOSIS — R051 Acute cough: Secondary | ICD-10-CM

## 2023-01-06 LAB — POC COVID19 BINAXNOW: SARS Coronavirus 2 Ag: NEGATIVE

## 2023-01-06 LAB — POCT RESPIRATORY SYNCYTIAL VIRUS: RSV Rapid Ag: NEGATIVE

## 2023-01-06 LAB — POCT INFLUENZA A/B
Influenza A, POC: NEGATIVE
Influenza B, POC: NEGATIVE

## 2023-01-06 NOTE — Progress Notes (Signed)
Subjective:  Patient ID: Felicia Hanson, female    DOB: June 10, 1956  Age: 67 y.o. MRN: [id number]  CC:  Chief Complaint  Patient presents with   Cough    Pt notes grandson has been positive for RSV, notes has been going on for a week or so     HPI Felicia Hanson presents for   Cough: Approximately past 1 week. Started 5 days ago. Fever that night and Sunday 100.8. temp 100.3 last night.  Positive sick contact with grandson who was tested positive for RSV recently.  denies shortness of breath. Slight wheeze last weekend - resolved.  Home covid test Saturday negative and again 2 days ago. Some runny nose.  Feeling better today.  Some green mucus past few days.  Started leftover zpak 3 days ago. Feels like this has helped. HA and sore throat resolved after 2 days.   History Patient Active Problem List   Diagnosis Date Noted   Benign essential tremor 05/25/2022   Shortness of breath at rest 05/09/2021   Loud snoring 03/24/2021   Excessive daytime sleepiness 03/24/2021   Acute on chronic combined systolic and diastolic HF (heart failure) ([place]) 03/24/2021   Panlobular emphysema ([place]) 03/24/2021   GAD (generalized anxiety disorder) 03/24/2021   Atypical chest pain 01/22/2021   Respiratory distress 01/20/2021   Acute on chronic combined systolic and diastolic CHF (congestive heart failure) ([place]) 01/20/2021   Type 2 diabetes mellitus without complication, without long-term current use of insulin ([place]) 10/26/2017   Generalized anxiety disorder 01/10/2014   Vitamin D deficiency 01/10/2014   Essential hypertension, benign 01/10/2014   Pure hypercholesterolemia 01/10/2014   Hypothyroidism 01/10/2014   Past Medical History:  Diagnosis Date   Allergy    Anxiety    CHF (congestive heart failure) ([place]) 01/20/2021   COPD (chronic obstructive pulmonary disease) ([place])    Coronary artery calcification    Depression    Diabetes mellitus without complication ([place])    Diverticulitis     with colon resection   GERD (gastroesophageal reflux disease)    on pepcid   Hyperlipidemia    Hypertension    Sleep apnea    cpap on order 01-01-22   Sleep paralysis    will be tested for narcolepsy per pt 01-01-22   Thyroid disease    Past Surgical History:  Procedure Laterality Date   BREAST EXCISIONAL BIOPSY Left    CESAREAN SECTION     x2   CHOLECYSTECTOMY     COLON SURGERY     diverticulitis with perforation; s/p colon resection.   Colonoscopy     COLONOSCOPY     DENTAL SURGERY     RIGHT/LEFT HEART CATH AND CORONARY ANGIOGRAPHY N/A 01/22/2021   Procedure: RIGHT/LEFT HEART CATH AND CORONARY ANGIOGRAPHY;  Surgeon: Adrian Prows, MD;  Location: [facility];  Service: Cardiovascular;  Laterality: N/A;   TUBAL LIGATION     Allergies  Allergen Reactions   Metformin And Related Other (See Comments)    Could not swallow   Nicotine Rash    Only allergic to nicotine patch   Ace Inhibitors Cough    Cough with lisinopril.    Prior to Admission medications   Medication Sig Start Date End Date Taking? Authorizing Provider  ACCU-CHEK AVIVA PLUS test strip daily. for testing 03/11/18  Yes [provider]  albuterol (VENTOLIN HFA) 108 (90 Base) MCG/ACT inhaler Inhale 1 puff into the lungs every 6 (six) hours as needed for wheezing or shortness  of breath.   Yes [provider]  ALPRAZolam Duanne Moron) 1 MG tablet Take 0.5 mg by mouth 3 (three) times daily as needed for anxiety or sleep.  08/21/13  Yes [provider]  aspirin 81 MG tablet Take 81 mg by mouth daily.   Yes [provider]  Azelastine HCl 137 MCG/SPRAY SOLN PLACE 1 SPRAY INTO BOTH NOSTRILS 2 (TWO) TIMES DAILY. USE IN EACH NOSTRIL AS DIRECTED IF RUNNY NOSE/DRAINAGE. 05/26/22  Yes Wendie Agreste, MD  BLACK ELDERBERRY PO Take 10 mLs by mouth daily.   Yes [provider]  cetirizine (ZYRTEC) 10 MG tablet Take 10 mg by mouth daily.   Yes [provider]  dapagliflozin  propanediol (FARXIGA) 10 MG TABS tablet Take 1 tablet (10 mg total) by mouth daily. 04/30/22  Yes Tolia, Sunit, DO  Dulaglutide (TRULICITY) 1.5 AY/3.0ZS SOPN INJECT 1.5 MG (0.5ML) UNDER THE SKIN ONCE A WEEK 10/20/22  Yes Wendie Agreste, MD  DULoxetine (CYMBALTA) 60 MG capsule Take 2 capsules (120 mg total) by mouth daily. 05/14/22  Yes Wendie Agreste, MD  ezetimibe (ZETIA) 10 MG tablet Take 1 tablet (10 mg total) by mouth daily. 10/28/22  Yes Wendie Agreste, MD  famotidine (PEPCID) 20 MG tablet Take 20 mg by mouth daily as needed for heartburn or indigestion.   Yes [provider]  fluticasone (FLOVENT HFA) 44 MCG/ACT inhaler Inhale 1-2 puffs into the lungs in the morning and at bedtime. 10/28/22  Yes Wendie Agreste, MD  insulin glargine (LANTUS SOLOSTAR) 100 UNIT/ML Solostar Pen Inject 54 Units into the skin at bedtime. 10/28/22  Yes Wendie Agreste, MD  Insulin Pen Needle 31G X 5 MM MISC Inject 1 each into the skin daily. 02/10/21  Yes Wendie Agreste, MD  levothyroxine (SYNTHROID) 50 MCG tablet Take 1 tablet (50 mcg total) by mouth daily. 12/22/22  Yes Wendie Agreste, MD  magnesium oxide (MAG-OX) 400 (240 Mg) MG tablet TAKE 0.5 TABLETS (200 MG TOTAL) BY MOUTH 2 TIMES DAILY. 01/06/22  Yes Tolia, Sunit, DO  metoprolol succinate (TOPROL-XL) 100 MG 24 hr tablet TAKE 1 TABLET BY MOUTH EVERY DAY WITH OR IMMEDIATELY FOLLOWING A MEAL 10/26/22  Yes Tolia, Sunit, DO  nitroGLYCERIN (NITROSTAT) 0.4 MG SL tablet PLACE 1 TABLET UNDER THE TONGUE EVERY 5 MINUTES AS NEEDED FOR CHEST PAIN. IF YOU REQUIRE MORE THAN TWO TABLETS FIVE MINUTES APART GO TO THE NEAREST ER VIA EMS. Patient taking differently: Place 0.4 mg under the tongue every 5 (five) minutes as needed for chest pain. 07/29/20  Yes Tolia, Sunit, DO  pravastatin (PRAVACHOL) 80 MG tablet Take 1 tablet (80 mg total) by mouth daily. TAKE 1 TABLET BY MOUTH EVERY DAY IN THE EVENING 10/28/22  Yes Wendie Agreste, MD  sacubitril-valsartan  (ENTRESTO) 97-103 MG Take 1 tablet by mouth 2 (two) times daily. 12/03/22  Yes Tolia, Sunit, DO  spironolactone (ALDACTONE) 25 MG tablet TAKE 1 TABLET BY MOUTH EVERY DAY 10/05/22  Yes Wendie Agreste, MD  topiramate (TOPAMAX) 50 MG tablet Take 1 tablet (50 mg total) by mouth 2 (two) times daily. 08/05/22  Yes Wendie Agreste, MD  Vitamin D, Ergocalciferol, (DRISDOL) 1.25 MG (50000 UNIT) CAPS capsule TAKE 1 CAPSULE (50,000 UNITS TOTAL) BY MOUTH EVERY 7 (SEVEN) DAYS 09/14/22  Yes Wendie Agreste, MD  Continuous Blood Gluc Receiver (DEXCOM G6 RECEIVER) DEVI 1 Device by Does not apply route every morning. Device and supplies Patient not taking: Reported on 06/17/2022  04/18/20   Shade Flood, MD  fenofibrate (TRICOR) 145 MG tablet Take 1 tablet (145 mg total) by mouth daily. 09/28/22 12/27/22  Tolia, Sunit, DO  fluticasone (FLONASE) 50 MCG/ACT nasal spray Place 2 sprays into both nostrils daily. Patient not taking: Reported on 10/28/2022 03/15/22   Leath-Warren, Sadie Haber, NP  nirmatrelvir & ritonavir (PAXLOVID, 150/100,) 10 x 150 MG & 10 x 100MG  TBPK Take 150 mg nirmatrelvir and 100 mg ritonavir PO BID for 5 days Patient not taking: Reported on 10/28/2022 08/30/22   10/30/22, PA-C  omeprazole (PRILOSEC) 20 MG capsule TAKE 1 CAPSULE BY MOUTH EVERY DAY Patient not taking: Reported on 10/28/2022 06/14/22   06/16/22, MD  ondansetron (ZOFRAN-ODT) 4 MG disintegrating tablet Take 1 tablet (4 mg total) by mouth every 8 (eight) hours as needed. Patient not taking: Reported on 10/28/2022 02/01/22   04/03/22, PA-C  predniSONE (DELTASONE) 20 MG tablet Take 2 tablets (40 mg total) by mouth daily with breakfast. Patient not taking: Reported on 09/28/2022 03/26/22   03/28/22, MD  promethazine-dextromethorphan (PROMETHAZINE-DM) 6.25-15 MG/5ML syrup Take 5 mLs by mouth 4 (four) times daily as needed for cough. Patient not taking: Reported on 09/28/2022 03/15/22   Leath-Warren, 03/17/22,  NP   Social History   Socioeconomic History   Marital status: Married    Spouse name: Not on file   Number of children: 2   Years of education: some college   Highest education level: Not on file  Occupational History   Occupation: Retired  Tobacco Use   Smoking status: Every Day    Packs/day: 0.25    Years: 38.00    Total pack years: 9.50    Types: Cigarettes   Smokeless tobacco: Never   Tobacco comments:    2-5 A DAY  Vaping Use   Vaping Use: Never used  Substance and Sexual Activity   Alcohol use: No    Alcohol/week: 0.0 standard drinks of alcohol   Drug use: No   Sexual activity: Not Currently  Other Topics Concern   Not on file  Social History Narrative   Marital status: married      Children:  2 children; 4 grandchildren      Lives: with husband, 2 granddaughters      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   Left-handed.   Caffeine use: 2.5 cups caffeine per day.   Social Determinants of Health   Financial Resource Strain: Low Risk  (06/17/2022)   Overall Financial Resource Strain (CARDIA)    Difficulty of Paying Living Expenses: Not hard at all  Food Insecurity: No Food Insecurity (06/17/2022)   Hunger Vital Sign    Worried About Running Out of Food in the Last Year: Never true    Ran Out of Food in the Last Year: Never true  Transportation Needs: No Transportation Needs (06/17/2022)   PRAPARE - 06/19/2022 (Medical): No    Lack of Transportation (Non-Medical): No  Physical Activity: Insufficiently Active (06/17/2022)   Exercise Vital Sign    Days of Exercise per Week: 3 days    Minutes of Exercise per Session: 30 min  Stress: No Stress Concern Present (06/17/2022)   06/19/2022 of Occupational Health - Occupational Stress Questionnaire    Feeling of Stress : Not at all  Social Connections: Moderately Integrated (  06/17/2022)   Social Connection and Isolation  Panel [NHANES]    Frequency of Communication with Friends and Family: Three times a week    Frequency of Social Gatherings with Friends and Family: Three times a week    Attends Religious Services: More than 4 times per year    Active Member of Clubs or Organizations: No    Attends Banker Meetings: Never    Marital Status: Married  Catering manager Violence: Not At Risk (06/17/2022)   Humiliation, Afraid, Rape, and Kick questionnaire    Fear of Current or Ex-Partner: No    Emotionally Abused: No    Physically Abused: No    Sexually Abused: No   Review of Systems Per HPI.   Objective:   Vitals:   01/06/23 1358  BP: 128/70  Pulse: 76  Temp: 98.4 F (36.9 C)  TempSrc: Temporal  SpO2: 100%  Weight: 147 lb 12.8 oz (67 kg)  Height: 5' (1.524 m)     Physical Exam Vitals reviewed.  Constitutional:      General: She is not in acute distress.    Appearance: She is well-developed.  HENT:     Head: Normocephalic and atraumatic.     Right Ear: Hearing, tympanic membrane, ear canal and external ear normal.     Left Ear: Hearing, tympanic membrane, ear canal and external ear normal.     Nose: Nose normal.     Mouth/Throat:     Pharynx: No posterior oropharyngeal erythema.  Eyes:     Conjunctiva/sclera: Conjunctivae normal.     Pupils: Pupils are equal, round, and reactive to light.  Cardiovascular:     Rate and Rhythm: Normal rate and regular rhythm.     Heart sounds: Normal heart sounds. No murmur heard. Pulmonary:     Effort: Pulmonary effort is normal. No respiratory distress.     Breath sounds: Normal breath sounds. No wheezing or rhonchi.  Skin:    General: Skin is warm and dry.     Findings: No rash.  Neurological:     Mental Status: She is alert and oriented to person, place, and time.  Psychiatric:        Mood and Affect: Mood normal.        Behavior: Behavior normal.    Results for orders placed or performed in visit on 01/06/23  POCT Influenza  A/B  Result Value Ref Range   Influenza A, POC Negative Negative   Influenza B, POC Negative Negative  POCT respiratory syncytial virus  Result Value Ref Range   RSV Rapid Ag Negative   POC COVID-19 BinaxNow  Result Value Ref Range   SARS Coronavirus 2 Ag Negative Negative     Assessment & Plan:  Felicia Hanson is a 67 y.o. female . Acute cough - Plan: POCT Influenza A/B, POCT respiratory syncytial virus, POC COVID-19 BinaxNow  Viral syndrome  - suspected viral syndrome, improving.  Possible RSV with negative testing as clearing virus versus other viral illness.  Is improving okay to continue azithromycin for now, but discussed possible side effects of antibiotics especially with viral infection.  RTC precautions given.  No orders of the defined types were placed in this encounter.  Patient Instructions  Glad to hear you are better.  I suspect you did have a virus, possible RSV, but testing here today was negative/normal.  You could have already cleared that virus, or again a different virus.  Okay to finish antibiotic but again less likely bacterial  infection at this time.  Because you are improving that is fine to continue for now.  Please follow-up if any persistent or worsening fevers, worsening shortness of breath, or cough does not continue to improve.  Take care.   Viral Respiratory Infection A respiratory infection is an illness that affects part of the respiratory system, such as the lungs, nose, or throat. A respiratory infection that is caused by a virus is called a viral respiratory infection. Common types of viral respiratory infections include: A cold. The flu (influenza). A respiratory syncytial virus (RSV) infection. What are the causes? This condition is caused by a virus. The virus may spread through contact with droplets or direct contact with infected people or their mucus or secretions. The virus may spread from person to person (is contagious). What are the  signs or symptoms? Symptoms of this condition include: A stuffy or runny nose. A sore throat or cough. Shortness of breath or difficulty breathing. Yellow or green mucus (sputum). Other symptoms may include: A fever. Sweating or chills. Fatigue. Achy muscles. A headache. How is this diagnosed? This condition may be diagnosed based on: Your symptoms. A physical exam. Testing of secretions from the nose or throat. Chest X-ray. How is this treated? This condition may be treated with medicines, such as: Antiviral medicine. This may shorten the length of time a person has symptoms. Expectorants. These make it easier to cough up mucus. Decongestant nasal sprays. Acetaminophen or NSAIDs, such as ibuprofen, to relieve fever and pain. Antibiotic medicines are not prescribed for viral infections.This is because antibiotics are designed to kill bacteria. They do not kill viruses. Follow these instructions at home: Managing pain and congestion Take over-the-counter and prescription medicines only as told by your health care provider. If you have a sore throat, gargle with a mixture of salt and water 3-4 times a day or as needed. To make salt water, completely dissolve -1 tsp (3-6 g) of salt in 1 cup (237 mL) of warm water. Use nose drops made from salt water to ease congestion and soften raw skin around your nose. Take 2 tsp (10 mL) of honey at bedtime to lessen coughing at night. Do not give honey to children who are younger than 1 year. Drink enough fluid to keep your urine pale yellow. This helps prevent dehydration and helps loosen up mucus. General instructions  Rest as much as possible. Do not drink alcohol. Do not use any products that contain nicotine or tobacco. These products include cigarettes, chewing tobacco, and vaping devices, such as e-cigarettes. If you need help quitting, ask your health care provider. Keep all follow-up visits. This is important. How is this  prevented?     Get an annual flu shot. You may get the flu shot in late summer, fall, or winter. Ask your health care provider when you should get your flu shot. Avoid spreading your infection to other people. If you are sick: Wash your hands with soap and water often, especially after you cough or sneeze. Wash for at least 20 seconds. If soap and water are not available, use alcohol-based hand sanitizer. Cover your mouth when you cough. Cover your nose and mouth when you sneeze. Do not share cups or eating utensils. Clean commonly used objects often. Clean commonly touched surfaces. Stay home from work or school as told by your health care provider. Avoid contact with people who are sick during cold and flu season. This is generally fall and winter. Contact a health care  provider if: Your symptoms last for 10 days or longer. Your symptoms get worse over time. You have severe sinus pain in your face or forehead. The glands in your jaw or neck become very swollen. You have shortness of breath. Get help right away if you: Feel pain or pressure in your chest. Have trouble breathing. Faint or feel like you will faint. Have severe and persistent vomiting. Feel confused or disoriented. These symptoms may represent a serious problem that is an emergency. Do not wait to see if the symptoms will go away. Get medical help right away. Call your local emergency services (911 in the [place] Do not drive yourself to the hospital. Summary A respiratory infection is an illness that affects part of the respiratory system, such as the lungs, nose, or throat. A respiratory infection that is caused by a virus is called a viral respiratory infection. Common types of viral respiratory infections include a cold, influenza, and respiratory syncytial virus (RSV) infection. Symptoms of this condition include a stuffy or runny nose, cough, fatigue, achy muscles, sore throat, and fevers or chills. Antibiotic  medicines are not prescribed for viral infections. This is because antibiotics are designed to kill bacteria. They are not effective against viruses. This information is not intended to replace advice given to you by your health care provider. Make sure you discuss any questions you have with your health care provider. Document Revised: 02/20/2021 Document Reviewed: 02/20/2021 Elsevier Patient Education  2023 Elsevier Inc.     Signed,   Meredith Staggers, MD [facility], [facility] 01/06/23 2:33 PM

## 2023-01-06 NOTE — Patient Instructions (Signed)
Glad to hear you are better.  I suspect you did have a virus, possible RSV, but testing here today was negative/normal.  You could have already cleared that virus, or again a different virus.  Okay to finish antibiotic but again less likely bacterial infection at this time.  Because you are improving that is fine to continue for now.  Please follow-up if any persistent or worsening fevers, worsening shortness of breath, or cough does not continue to improve.  Take care.   Viral Respiratory Infection A respiratory infection is an illness that affects part of the respiratory system, such as the lungs, nose, or throat. A respiratory infection that is caused by a virus is called a viral respiratory infection. Common types of viral respiratory infections include: A cold. The flu (influenza). A respiratory syncytial virus (RSV) infection. What are the causes? This condition is caused by a virus. The virus may spread through contact with droplets or direct contact with infected people or their mucus or secretions. The virus may spread from person to person (is contagious). What are the signs or symptoms? Symptoms of this condition include: A stuffy or runny nose. A sore throat or cough. Shortness of breath or difficulty breathing. Yellow or green mucus (sputum). Other symptoms may include: A fever. Sweating or chills. Fatigue. Achy muscles. A headache. How is this diagnosed? This condition may be diagnosed based on: Your symptoms. A physical exam. Testing of secretions from the nose or throat. Chest X-ray. How is this treated? This condition may be treated with medicines, such as: Antiviral medicine. This may shorten the length of time a person has symptoms. Expectorants. These make it easier to cough up mucus. Decongestant nasal sprays. Acetaminophen or NSAIDs, such as ibuprofen, to relieve fever and pain. Antibiotic medicines are not prescribed for viral infections.This is because  antibiotics are designed to kill bacteria. They do not kill viruses. Follow these instructions at home: Managing pain and congestion Take over-the-counter and prescription medicines only as told by your health care provider. If you have a sore throat, gargle with a mixture of salt and water 3-4 times a day or as needed. To make salt water, completely dissolve -1 tsp (3-6 g) of salt in 1 cup (237 mL) of warm water. Use nose drops made from salt water to ease congestion and soften raw skin around your nose. Take 2 tsp (10 mL) of honey at bedtime to lessen coughing at night. Do not give honey to children who are younger than 1 year. Drink enough fluid to keep your urine pale yellow. This helps prevent dehydration and helps loosen up mucus. General instructions  Rest as much as possible. Do not drink alcohol. Do not use any products that contain nicotine or tobacco. These products include cigarettes, chewing tobacco, and vaping devices, such as e-cigarettes. If you need help quitting, ask your health care provider. Keep all follow-up visits. This is important. How is this prevented?     Get an annual flu shot. You may get the flu shot in late summer, fall, or winter. Ask your health care provider when you should get your flu shot. Avoid spreading your infection to other people. If you are sick: Wash your hands with soap and water often, especially after you cough or sneeze. Wash for at least 20 seconds. If soap and water are not available, use alcohol-based hand sanitizer. Cover your mouth when you cough. Cover your nose and mouth when you sneeze. Do not share cups or eating utensils.  Clean commonly used objects often. Clean commonly touched surfaces. Stay home from work or school as told by your health care provider. Avoid contact with people who are sick during cold and flu season. This is generally fall and winter. Contact a health care provider if: Your symptoms last for 10 days or  longer. Your symptoms get worse over time. You have severe sinus pain in your face or forehead. The glands in your jaw or neck become very swollen. You have shortness of breath. Get help right away if you: Feel pain or pressure in your chest. Have trouble breathing. Faint or feel like you will faint. Have severe and persistent vomiting. Feel confused or disoriented. These symptoms may represent a serious problem that is an emergency. Do not wait to see if the symptoms will go away. Get medical help right away. Call your local emergency services (911 in the [place] Do not drive yourself to the hospital. Summary A respiratory infection is an illness that affects part of the respiratory system, such as the lungs, nose, or throat. A respiratory infection that is caused by a virus is called a viral respiratory infection. Common types of viral respiratory infections include a cold, influenza, and respiratory syncytial virus (RSV) infection. Symptoms of this condition include a stuffy or runny nose, cough, fatigue, achy muscles, sore throat, and fevers or chills. Antibiotic medicines are not prescribed for viral infections. This is because antibiotics are designed to kill bacteria. They are not effective against viruses. This information is not intended to replace advice given to you by your health care provider. Make sure you discuss any questions you have with your health care provider. Document Revised: 02/20/2021 Document Reviewed: 02/20/2021 Elsevier Patient Education  [place].

## 2023-01-10 ENCOUNTER — Other Ambulatory Visit: Payer: Self-pay | Admitting: Family Medicine

## 2023-01-10 DIAGNOSIS — R052 Subacute cough: Secondary | ICD-10-CM

## 2023-01-10 DIAGNOSIS — K219 Gastro-esophageal reflux disease without esophagitis: Secondary | ICD-10-CM

## 2023-01-12 ENCOUNTER — Other Ambulatory Visit: Payer: Medicare Other

## 2023-01-24 ENCOUNTER — Other Ambulatory Visit: Payer: Self-pay | Admitting: Family Medicine

## 2023-01-24 DIAGNOSIS — E1129 Type 2 diabetes mellitus with other diabetic kidney complication: Secondary | ICD-10-CM

## 2023-02-02 ENCOUNTER — Institutional Professional Consult (permissible substitution): Payer: Medicare Other | Admitting: Neurology

## 2023-02-02 ENCOUNTER — Encounter: Payer: Self-pay | Admitting: Neurology

## 2023-02-03 ENCOUNTER — Emergency Department (HOSPITAL_COMMUNITY)
Admission: EM | Admit: 2023-02-03 | Discharge: 2023-02-04 | Disposition: A | Discharge status: Home / Self Care | Payer: Medicare Other | Attending: Emergency Medicine | Admitting: Emergency Medicine

## 2023-02-03 ENCOUNTER — Emergency Department (HOSPITAL_COMMUNITY): Payer: Medicare Other

## 2023-02-03 ENCOUNTER — Encounter (HOSPITAL_COMMUNITY): Payer: Self-pay

## 2023-02-03 DIAGNOSIS — S0081XA Abrasion of other part of head, initial encounter: Secondary | ICD-10-CM

## 2023-02-03 DIAGNOSIS — I110 Hypertensive heart disease with heart failure: Secondary | ICD-10-CM | POA: Diagnosis not present

## 2023-02-03 DIAGNOSIS — I700 Atherosclerosis of aorta: Secondary | ICD-10-CM | POA: Insufficient documentation

## 2023-02-03 DIAGNOSIS — Z23 Encounter for immunization: Secondary | ICD-10-CM | POA: Diagnosis not present

## 2023-02-03 DIAGNOSIS — I2510 Atherosclerotic heart disease of native coronary artery without angina pectoris: Secondary | ICD-10-CM | POA: Diagnosis not present

## 2023-02-03 DIAGNOSIS — S81011A Laceration without foreign body, right knee, initial encounter: Secondary | ICD-10-CM | POA: Diagnosis not present

## 2023-02-03 DIAGNOSIS — I6782 Cerebral ischemia: Secondary | ICD-10-CM | POA: Diagnosis not present

## 2023-02-03 DIAGNOSIS — Z041 Encounter for examination and observation following transport accident: Secondary | ICD-10-CM | POA: Diagnosis not present

## 2023-02-03 DIAGNOSIS — K7460 Unspecified cirrhosis of liver: Secondary | ICD-10-CM | POA: Diagnosis not present

## 2023-02-03 DIAGNOSIS — S199XXA Unspecified injury of neck, initial encounter: Secondary | ICD-10-CM | POA: Diagnosis not present

## 2023-02-03 DIAGNOSIS — S022XXA Fracture of nasal bones, initial encounter for closed fracture: Secondary | ICD-10-CM | POA: Insufficient documentation

## 2023-02-03 DIAGNOSIS — N281 Cyst of kidney, acquired: Secondary | ICD-10-CM | POA: Insufficient documentation

## 2023-02-03 DIAGNOSIS — J449 Chronic obstructive pulmonary disease, unspecified: Secondary | ICD-10-CM | POA: Insufficient documentation

## 2023-02-03 DIAGNOSIS — I509 Heart failure, unspecified: Secondary | ICD-10-CM | POA: Diagnosis not present

## 2023-02-03 DIAGNOSIS — S3993XA Unspecified injury of pelvis, initial encounter: Secondary | ICD-10-CM | POA: Diagnosis not present

## 2023-02-03 DIAGNOSIS — E119 Type 2 diabetes mellitus without complications: Secondary | ICD-10-CM | POA: Diagnosis not present

## 2023-02-03 DIAGNOSIS — S0992XA Unspecified injury of nose, initial encounter: Secondary | ICD-10-CM | POA: Diagnosis present

## 2023-02-03 DIAGNOSIS — S42291A Other displaced fracture of upper end of right humerus, initial encounter for closed fracture: Secondary | ICD-10-CM | POA: Insufficient documentation

## 2023-02-03 DIAGNOSIS — S299XXA Unspecified injury of thorax, initial encounter: Secondary | ICD-10-CM | POA: Diagnosis not present

## 2023-02-03 DIAGNOSIS — Z7982 Long term (current) use of aspirin: Secondary | ICD-10-CM | POA: Insufficient documentation

## 2023-02-03 DIAGNOSIS — Z79899 Other long term (current) drug therapy: Secondary | ICD-10-CM | POA: Diagnosis not present

## 2023-02-03 DIAGNOSIS — S42201A Unspecified fracture of upper end of right humerus, initial encounter for closed fracture: Secondary | ICD-10-CM | POA: Diagnosis not present

## 2023-02-03 DIAGNOSIS — S0990XA Unspecified injury of head, initial encounter: Secondary | ICD-10-CM | POA: Diagnosis not present

## 2023-02-03 DIAGNOSIS — Y92481 Parking lot as the place of occurrence of the external cause: Secondary | ICD-10-CM | POA: Insufficient documentation

## 2023-02-03 DIAGNOSIS — S3991XA Unspecified injury of abdomen, initial encounter: Secondary | ICD-10-CM | POA: Diagnosis not present

## 2023-02-03 DIAGNOSIS — S0993XA Unspecified injury of face, initial encounter: Secondary | ICD-10-CM | POA: Diagnosis not present

## 2023-02-03 LAB — I-STAT CHEM 8, ED
BUN: 20 mg/dL (ref 8–23)
Calcium, Ion: 1.2 mmol/L (ref 1.15–1.40)
Chloride: 105 mmol/L (ref 98–111)
Creatinine, Ser: 1.6 mg/dL — ABNORMAL HIGH (ref 0.44–1.00)
Glucose, Bld: 77 mg/dL (ref 70–99)
HCT: 43 % (ref 36.0–46.0)
Hemoglobin: 14.6 g/dL (ref 12.0–15.0)
Potassium: 3.8 mmol/L (ref 3.5–5.1)
Sodium: 141 mmol/L (ref 135–145)
TCO2: 23 mmol/L (ref 22–32)

## 2023-02-03 LAB — SAMPLE TO BLOOD BANK

## 2023-02-03 LAB — CBC
HCT: 42.4 % (ref 36.0–46.0)
Hemoglobin: 14.3 g/dL (ref 12.0–15.0)
MCH: 29.6 pg (ref 26.0–34.0)
MCHC: 33.7 g/dL (ref 30.0–36.0)
MCV: 87.8 fL (ref 80.0–100.0)
Platelets: 205 10*3/uL (ref 150–400)
RBC: 4.83 MIL/uL (ref 3.87–5.11)
RDW: 13.3 % (ref 11.5–15.5)
WBC: 12.9 10*3/uL — ABNORMAL HIGH (ref 4.0–10.5)
nRBC: 0 % (ref 0.0–0.2)

## 2023-02-03 LAB — COMPREHENSIVE METABOLIC PANEL
ALT: 14 U/L (ref 0–44)
AST: 26 U/L (ref 15–41)
Albumin: 3.6 g/dL (ref 3.5–5.0)
Alkaline Phosphatase: 69 U/L (ref 38–126)
Anion gap: 13 (ref 5–15)
BUN: 20 mg/dL (ref 8–23)
CO2: 19 mmol/L — ABNORMAL LOW (ref 22–32)
Calcium: 9.2 mg/dL (ref 8.9–10.3)
Chloride: 107 mmol/L (ref 98–111)
Creatinine, Ser: 1.59 mg/dL — ABNORMAL HIGH (ref 0.44–1.00)
GFR, Estimated: 35 mL/min — ABNORMAL LOW (ref >60)
Glucose, Bld: 76 mg/dL (ref 70–99)
Potassium: 3.9 mmol/L (ref 3.5–5.1)
Sodium: 139 mmol/L (ref 135–145)
Total Bilirubin: 0.8 mg/dL (ref 0.3–1.2)
Total Protein: 7.3 g/dL (ref 6.5–8.1)

## 2023-02-03 MED ORDER — HYDROCODONE-ACETAMINOPHEN 5-325 MG PO TABS: 1.0000 | ORAL_TABLET | Freq: Four times a day (QID) | ORAL | 0 refills | Status: DC | PRN

## 2023-02-03 MED ORDER — IOHEXOL 350 MG/ML SOLN
65.0000 mL | Freq: Once | INTRAVENOUS | Status: AC | PRN
  Administered 2023-02-03: 65 mL via INTRAVENOUS

## 2023-02-03 MED ORDER — BACITRACIN ZINC 500 UNIT/GM EX OINT
TOPICAL_OINTMENT | Freq: Once | CUTANEOUS | Status: AC
  Administered 2023-02-04: 1 via TOPICAL
  Filled 2023-02-03: qty 0.9

## 2023-02-03 MED ORDER — LIDOCAINE-EPINEPHRINE (PF) 2 %-1:200000 IJ SOLN
10.0000 mL | Freq: Once | INTRAMUSCULAR | Status: AC
  Administered 2023-02-03: 10 mL
  Filled 2023-02-03: qty 20

## 2023-02-03 MED ORDER — MORPHINE SULFATE (PF) 4 MG/ML IV SOLN
4.0000 mg | Freq: Once | INTRAVENOUS | Status: DC
  Filled 2023-02-03: qty 1

## 2023-02-03 MED ORDER — TETANUS-DIPHTH-ACELL PERTUSSIS 5-2.5-18.5 LF-MCG/0.5 IM SUSY
0.5000 mL | PREFILLED_SYRINGE | Freq: Once | INTRAMUSCULAR | Status: AC
  Administered 2023-02-03: 0.5 mL via INTRAMUSCULAR
  Filled 2023-02-03: qty 0.5

## 2023-02-03 NOTE — Progress Notes (Signed)
Orthopedic Tech Progress Note Patient Details:  Felicia Hanson 07-24-1956 [id number]  Level 2 trauma   Ortho Devices Type of Ortho Device: Shoulder immobilizer Ortho Device/Splint Location: RLE Ortho Device/Splint Interventions: Ordered, Application, Adjustment   Post Interventions Patient Tolerated: Well Instructions Provided: Care of device  Janit Pagan 02/03/2023, 11:40 PM

## 2023-02-03 NOTE — ED Notes (Signed)
Pt to CT scanner at this time 

## 2023-02-03 NOTE — Progress Notes (Signed)
   02/03/23 2029  Spiritual Encounters  Type of Visit Initial  Care provided to: Patient  Reason for visit Trauma  OnCall Visit Yes   [facility] responded to Trauma 2 page.  Patient unavailable as medical team provided care.  Support person waiting in the ED waiting room.  [facility] services remain available via page

## 2023-02-03 NOTE — ED Notes (Signed)
Pt return from CT scanner, A&O x4, VSS
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
## 2023-02-03 NOTE — Discharge Instructions (Addendum)
Take the medications as prescribed to help with your pain.  You can also apply ice.  Keep your arm in the sling.  You can remove it if you need to bathe.  Suture removal recommended in 10 days.  Apply antibiotic ointment to your abrasions
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
## 2023-02-03 NOTE — ED Notes (Signed)
Portable xray at the bedside for films
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
## 2023-02-03 NOTE — ED Notes (Signed)
Verbal consent given by the patient for TRN to go and speak with her husband and update him on her condition, husband is a patient on 4N ICU.

## 2023-02-03 NOTE — ED Notes (Signed)
Ortho tech called for sling placement

## 2023-02-04 MED ORDER — HYDROCODONE-ACETAMINOPHEN 5-325 MG PO TABS
1.0000 | ORAL_TABLET | Freq: Once | ORAL | Status: AC
  Administered 2023-02-04: 1 via ORAL
  Filled 2023-02-04: qty 1

## 2023-02-04 NOTE — ED Provider Notes (Signed)
  Procedures  .Marland KitchenLaceration Repair  Date/Time: 02/04/2023 12:34 AM  Performed by: Kateri Plummer, PA-C Authorized by: Kateri Plummer, PA-C   Consent:    Consent obtained:  Verbal   Consent given by:  Patient   Risks, benefits, and alternatives were discussed: yes     Risks discussed:  Infection, poor cosmetic result, poor wound healing and pain Universal protocol:    Patient identity confirmed:  Provided demographic data Anesthesia:    Anesthesia method:  Local infiltration   Local anesthetic:  Lidocaine 2% WITH epi Laceration details:    Location:  Leg   Leg location:  R knee   Length (cm):  3   Depth (mm):  2 Pre-procedure details:    Preparation:  Patient was prepped and draped in usual sterile fashion Exploration:    Hemostasis achieved with:  Epinephrine and direct pressure   Imaging obtained: x-ray     Imaging outcome: foreign body not noted     Wound exploration: wound explored through full range of motion   Treatment:    Area cleansed with:  Saline   Amount of cleaning:  Standard   Irrigation solution:  Sterile saline   Irrigation volume:  500 cc   Irrigation method:  Pressure wash   Visualized foreign bodies/material removed: no   Skin repair:    Repair method:  Sutures   Suture size:  4-0   Suture material:  Plain gut   Suture technique:  Simple interrupted   Number of sutures:  6 Approximation:    Approximation:  Loose Repair type:    Repair type:  Intermediate Post-procedure details:    Dressing:  Non-adherent dressing   Procedure completion:  Tolerated well, no immediate complications    Malaya Cagley T, PA-C 02/04/23 0036    Dorie Rank, MD 02/05/23 [phone]

## 2023-02-04 NOTE — ED Notes (Signed)
Pt's wound to face was cleansed with soap and water, dried blood removed from pt's face and hands. Bacitracin ointment applied to pt's bridge of nose.

## 2023-02-05 DIAGNOSIS — M79645 Pain in left finger(s): Secondary | ICD-10-CM | POA: Diagnosis not present

## 2023-02-05 DIAGNOSIS — M25561 Pain in right knee: Secondary | ICD-10-CM | POA: Diagnosis not present

## 2023-02-08 ENCOUNTER — Other Ambulatory Visit: Payer: Self-pay | Admitting: Orthopedic Surgery

## 2023-02-08 ENCOUNTER — Telehealth: Payer: Self-pay

## 2023-02-08 DIAGNOSIS — S42294A Other nondisplaced fracture of upper end of right humerus, initial encounter for closed fracture: Secondary | ICD-10-CM

## 2023-02-08 NOTE — Transitions of Care (Post Inpatient/ED Visit) (Addendum)
02/08/2023  Name: Felicia Hanson MRN: [id number] DOB: September 24, 1956  Today's TOC FU Call Status: Today's TOC FU Call Status:: Successful TOC FU Call Competed (Incoming call from pt-returning RN CM call) TOC FU Call Complete Date: 02/08/23  Transition Care Management Follow-up Telephone Call Date of Discharge: 02/04/23 Discharge Facility: [facility]) Type of Discharge: Emergency Department Reason for ED Visit: Other: ("closed fracture of proximal end of right humerus") How have you been since you were released from the hospital?: Same (Patient reports that she is having a lot of pain-she stopped taking pain meds-fearful that it will interact with her prescription/ regular meds. Long talk with pt regarding pain mgmt.) Any questions or concerns?: Yes Patient Questions/Concerns:: Patient fearful to take pain medicine -concerned it may interact with her regular meds Patient Questions/Concerns Addressed: Other: (logn tlak with pt regarding pain mgmt, and drug interactions. She remains very hesistant-she will call pharmacy and her cardiologist to verify its okay to take) Red on EMMI-ED Discharge Alert Date & Reason:02/05/23-"Scheduled follow-up appt? No"  Items Reviewed: Did you receive and understand the discharge instructions provided?: Yes Medications obtained and verified?: Yes (Medications Reviewed) Any new allergies since your discharge?: No Dietary orders reviewed?: NA Do you have support at home?: Yes Name of Support/Comfort Primary Source: spouse currently hospitalized-her adult children and grandkids have been staying and helping her out  [place] and Equipment/Supplies: [place] Ordered?: NA Any new equipment or medical supplies ordered?: NA  Functional Questionnaire: Do you need assistance with bathing/showering or dressing?: No Do you need assistance with meal preparation?: No Do you need assistance with eating?: No Do you have difficulty maintaining  continence: No Do you need assistance with getting out of bed/getting out of a chair/moving?: No Do you have difficulty managing or taking your medications?: No  Folllow up appointments reviewed: PCP Follow-up appointment confirmed?: [facility] Follow-up appointment confirmed?: Yes Date of Specialist follow-up appointment?: 02/05/23 Follow-Up Specialty Provider:: Patient states she saw ortho MD at [facility] Do you need transportation to your follow-up appointment?: No Do you understand care options if your condition(s) worsen?: Yes-patient verbalized understanding  SDOH Interventions Today    Flowsheet Row Most Recent Value  SDOH Interventions   Food Insecurity Interventions Intervention Not Indicated  Transportation Interventions Intervention Not Indicated      TOC Interventions Today    Flowsheet Row Most Recent Value  TOC Interventions   TOC Interventions Discussed/Reviewed TOC Interventions Discussed, Post discharge activity limitations per provider      Interventions Today    Flowsheet Row Most Recent Value  Education Interventions   Education Provided Provided Education  [pain mgmt]  Provided Verbal Education On Medication, When to see the doctor, Other  Pharmacy Interventions   Pharmacy Dicussed/Reviewed Pharmacy Topics Discussed, Medications and their functions  Safety Interventions   Safety Discussed/Reviewed Safety Discussed, Fall Risk      TOC Interventions Today    Flowsheet Row Most Recent Value  TOC Interventions   TOC Interventions Discussed/Reviewed TOC Interventions Discussed, Post discharge activity limitations per provider      Interventions Today    Flowsheet Row Most Recent Value  Education Interventions   Education Provided Provided Education  [pain mgmt]  Provided Verbal Education On Medication, When to see the doctor, Other  Pharmacy Interventions   Pharmacy Dicussed/Reviewed Pharmacy Topics Discussed, Medications and  their functions  Safety Interventions   Safety Discussed/Reviewed Safety Discussed, Fall Risk      Enzo Montgomery, RN,BSN,CCM  [facility] Care Management Care Management Community Coordinator Direct Phone: [phone] Toll Free: [phone] Fax: [phone]

## 2023-02-08 NOTE — Transitions of Care (Post Inpatient/ED Visit) (Signed)
   02/08/2023  Name: Felicia Hanson MRN: [id number] DOB: March 25, 1956  Today's TOC FU Call Status: Today's TOC FU Call Status:: Unsuccessul Call (1st Attempt) Unsuccessful Call (1st Attempt) Date: 02/08/23  Attempted to reach the patient regarding the most recent Inpatient/ED visit.  Follow Up Plan: Additional outreach attempts will be made to reach the patient to complete the Transitions of Care (Post Inpatient/ED visit) call.     Enzo Montgomery, RN,BSN,CCM [facility] Care Management Care Management Community Coordinator Direct Phone: [phone] Toll Free: [phone] Fax: [phone]

## 2023-02-12 ENCOUNTER — Encounter: Payer: Self-pay | Admitting: Family Medicine

## 2023-02-12 ENCOUNTER — Ambulatory Visit (INDEPENDENT_AMBULATORY_CARE_PROVIDER_SITE_OTHER): Payer: Medicare Other | Admitting: Family Medicine

## 2023-02-12 VITALS — BP 124/70 | HR 89 | Temp 97.8°F | Ht 60.0 in | Wt 150.4 lb

## 2023-02-12 DIAGNOSIS — S42201D Unspecified fracture of upper end of right humerus, subsequent encounter for fracture with routine healing: Secondary | ICD-10-CM | POA: Diagnosis not present

## 2023-02-12 DIAGNOSIS — R0789 Other chest pain: Secondary | ICD-10-CM | POA: Diagnosis not present

## 2023-02-12 DIAGNOSIS — M545 Low back pain, unspecified: Secondary | ICD-10-CM

## 2023-02-12 DIAGNOSIS — S81011D Laceration without foreign body, right knee, subsequent encounter: Secondary | ICD-10-CM

## 2023-02-12 DIAGNOSIS — S022XXD Fracture of nasal bones, subsequent encounter for fracture with routine healing: Secondary | ICD-10-CM | POA: Diagnosis not present

## 2023-02-12 DIAGNOSIS — M6281 Muscle weakness (generalized): Secondary | ICD-10-CM | POA: Diagnosis not present

## 2023-02-12 MED ORDER — CEPHALEXIN 500 MG PO CAPS: 500.0000 mg | ORAL_CAPSULE | Freq: Two times a day (BID) | ORAL | 0 refills | Status: DC

## 2023-02-12 NOTE — Progress Notes (Unsigned)
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 nose. Bruising around the nose or bruising around the eyes (black eyes). Crooked appearance of the nose. How is this diagnosed? This condition may be diagnosed based on a physical exam. During the exam, the health care provider will: Gently feel the nose for signs of broken bones. Look inside the nostrils to check if there is a blood-filled swelling on the dividing wall between the nostrils (septal hematoma). An X-ray of the nose may be taken. Sometimes, an X-ray may not show a nasal fracture even when one is present. In some cases, X-rays or a CT scan may be taken again 1-5 days later after the swelling has gone down. How is this treated? Treatment for this condition depends on the severity of the injury. Minor fractures that have not caused  deformity often do not require treatment. They may heal on their own. For more serious fractures that have caused bones to move out of position, treatment may involve one of the following: Repositioning the bones without surgery. The health care provider may be able to do this in his or her office after you are given medicine to numb the nasal area (local anesthetic). Surgery. If surgery is needed, it will be done after the swelling is gone. Surgery will stabilize and align the fracture. Follow these instructions at home:     Activity Return to your normal activities as told by your health care provider. Ask your health care provider what activities are safe for you. Do not play contact sports for 3-4 weeks or as told by your health care provider. Managing pain and swelling If directed, put ice on the injured area. To do this: Put ice in a plastic bag. Place a towel between your skin and the bag. Leave the ice on for 20 minutes, 2-3 times a day. Take off the ice if your skin turns bright red. This is very important. If you cannot feel pain, heat, or cold, you have a greater risk of damage to the area. General instructions Take over-the-counter and prescription medicines  only as told by your health care provider. If your nose starts to bleed, sit in an upright position while you squeeze the soft parts of your nose against the dividing wall between your nostrils (septum) for 10 minutes. Try to avoid blowing your nose. Keep all follow-up visits. This is important. Contact a health care provider if: Your pain increases or becomes severe. You continue to have nosebleeds. The shape of your nose does not return to normal within 5 days. You have pus draining out of your nose. Get help right away if: You have bleeding from your nose that does not stop after you pinch your nostrils closed for 20 minutes and keep ice on your nose. You have clear fluid draining out of your nose. You notice swelling  near the septum inside the nose. This swelling is a septal hematoma that must be drained to help prevent infection. You have difficulty moving your eyes. You have repeated vomiting. These symptoms may be an emergency. Get help right away. Call 911. Do not wait to see if the symptoms will go away. Do not drive yourself to the hospital. Summary A nasal fracture is a break or crack in the bones or cartilage of the nose. The fracture is usually caused by a blunt injury to the nose. Symptoms include pain, swelling, and facial bruising. Nasal fractures may heal on their own, or your health care provider may need to move the bones back into proper position. In some cases, surgery may be needed. This information is not intended to replace advice given to you by your health care provider. Make sure you discuss any questions you have with your health care provider. Document Revised: 06/25/2021 Document Reviewed: 06/25/2021 Elsevier Patient Education  [place],   Merri Ray, MD [place], [facility] 02/12/23 3:28 PM

## 2023-02-12 NOTE — Patient Instructions (Addendum)
I am so sorry to hear about your injuries.  Hydrocodone if needed for pain, but do not combine with alprazolam. Other meds are ok for now.  Tylenol if only mild pain. Follow up with Dr. Mardelle Matte as planned for your shoulder including planned CT scan and use sling as instructed.  I will refer you to ear nose and throat specialist for nasal fracture. Glad to hear that is better.   Knee should continue to heal, but with redness and some discharge noted yesterday, I will start a temporary antibiotic.  Please follow-up next week if any worsening redness, discharge or pain.  Urgent care or emergency room over the weekend if any acute worsening.  X-ray at the [facility] location for rib soreness but there was not a rib fracture or break on your initial CAT scan at the hospital.  Follow-up with me in 1 week if knee or back pain is not improving, or any persistent soreness in the chest wall.  Please be seen sooner if any new or worsening symptoms.  Back xray also ordered. As pain noted next day, probably a strain of muscles. See info on back pain below.   I will refer you to neurology again to discuss the prior episode of trunk weakness. Be seen right away if that returns.   Follow-up with your mental health provider as we discussed as I can imagine the stress of this incident has been very difficult.  Hang in there.  [facility] or xray: Walk in 8:30-4:30 during weekdays, no appointment needed [place].  [place], [place] [id number]   Acute Back Pain, Adult Acute back pain is sudden and usually short-lived. It is often caused by an injury to the muscles and tissues in the back. The injury may result from: A muscle, tendon, or ligament getting overstretched or torn. Ligaments are tissues that connect bones to each other. Lifting something improperly can cause a back strain. Wear and tear (degeneration) of the spinal disks. Spinal disks are circular tissue that provide cushioning between the  bones of the spine (vertebrae). Twisting motions, such as while playing sports or doing yard work. A hit to the back. Arthritis. You may have a physical exam, lab tests, and imaging tests to find the cause of your pain. Acute back pain usually goes away with rest and home care. Follow these instructions at home: Managing pain, stiffness, and swelling Take over-the-counter and prescription medicines only as told by your health care provider. Treatment may include medicines for pain and inflammation that are taken by mouth or applied to the skin, or muscle relaxants. Your health care provider may recommend applying ice during the first 24-48 hours after your pain starts. To do this: Put ice in a plastic bag. Place a towel between your skin and the bag. Leave the ice on for 20 minutes, 2-3 times a day. Remove the ice if your skin turns bright red. This is very important. If you cannot feel pain, heat, or cold, you have a greater risk of damage to the area. If directed, apply heat to the affected area as often as told by your health care provider. Use the heat source that your health care provider recommends, such as a moist heat pack or a heating pad. Place a towel between your skin and the heat source. Leave the heat on for 20-30 minutes. Remove the heat if your skin turns bright red. This is especially important if you are unable to feel pain, heat,  or cold. You have a greater risk of getting burned. Activity  Do not stay in bed. Staying in bed for more than 1-2 days can delay your recovery. Sit up and stand up straight. Avoid leaning forward when you sit or hunching over when you stand. If you work at a desk, sit close to it so you do not need to lean over. Keep your chin tucked in. Keep your neck drawn back, and keep your elbows bent at a 90-degree angle (right angle). Sit high and close to the steering wheel when you drive. Add lower back (lumbar) support to your car seat, if needed. Take  short walks on even surfaces as soon as you are able. Try to increase the length of time you walk each day. Do not sit, drive, or stand in one place for more than 30 minutes at a time. Sitting or standing for long periods of time can put stress on your back. Do not drive or use heavy machinery while taking prescription pain medicine. Use proper lifting techniques. When you bend and lift, use positions that put less stress on your back: [place] your knees. Keep the load close to your body. Avoid twisting. Exercise regularly as told by your health care provider. Exercising helps your back heal faster and helps prevent back injuries by keeping muscles strong and flexible. Work with a physical therapist to make a safe exercise program, as recommended by your health care provider. Do any exercises as told by your physical therapist. Lifestyle Maintain a healthy weight. Extra weight puts stress on your back and makes it difficult to have good posture. Avoid activities or situations that make you feel anxious or stressed. Stress and anxiety increase muscle tension and can make back pain worse. Learn ways to manage anxiety and stress, such as through exercise. General instructions Sleep on a firm mattress in a comfortable position. Try lying on your side with your knees slightly bent. If you lie on your back, put a pillow under your knees. Keep your head and neck in a straight line with your spine (neutral position) when using electronic equipment like smartphones or pads. To do this: Raise your smartphone or pad to look at it instead of bending your head or neck to look down. Put the smartphone or pad at the level of your face while looking at the screen. Follow your treatment plan as told by your health care provider. This may include: Cognitive or behavioral therapy. Acupuncture or massage therapy. Meditation or yoga. Contact a health care provider if: You have pain that is not relieved with rest or  medicine. You have increasing pain going down into your legs or buttocks. Your pain does not improve after 2 weeks. You have pain at night. You lose weight without trying. You have a fever or chills. You develop nausea or vomiting. You develop abdominal pain. Get help right away if: You develop new bowel or bladder control problems. You have unusual weakness or numbness in your arms or legs. You feel faint. These symptoms may represent a serious problem that is an emergency. Do not wait to see if the symptoms will go away. Get medical help right away. Call your local emergency services (911 in the [place] Do not drive yourself to the hospital. Summary Acute back pain is sudden and usually short-lived. Use proper lifting techniques. When you bend and lift, use positions that put less stress on your back. Take over-the-counter and prescription medicines only as told by your health  care provider, and apply heat or ice as told. This information is not intended to replace advice given to you by your health care provider. Make sure you discuss any questions you have with your health care provider. Document Revised: 02/07/2021 Document Reviewed: 02/07/2021 Elsevier Patient Education  [place].    Nasal Fracture A nasal fracture is a break or crack in the bones of the nose or the tissue that helps to form the nose (cartilage). Minor breaks do not require treatment and usually heal on their own after about one month. Serious breaks may require treatment that could include surgery. What are the causes? A nasal fracture is usually caused by the strong force of a direct hit to the nose (blunt injury). This type of injury often occurs from: Playing a contact sport. Being involved in a car accident. Falling. Getting punched in the face. What are the signs or symptoms? Symptoms of this condition include: Pain. Swelling of the nose. Bleeding from the nose. Bruising around the nose or  bruising around the eyes (black eyes). Crooked appearance of the nose. How is this diagnosed? This condition may be diagnosed based on a physical exam. During the exam, the health care provider will: Gently feel the nose for signs of broken bones. Look inside the nostrils to check if there is a blood-filled swelling on the dividing wall between the nostrils (septal hematoma). An X-ray of the nose may be taken. Sometimes, an X-ray may not show a nasal fracture even when one is present. In some cases, X-rays or a CT scan may be taken again 1-5 days later after the swelling has gone down. How is this treated? Treatment for this condition depends on the severity of the injury. Minor fractures that have not caused deformity often do not require treatment. They may heal on their own. For more serious fractures that have caused bones to move out of position, treatment may involve one of the following: Repositioning the bones without surgery. The health care provider may be able to do this in his or her office after you are given medicine to numb the nasal area (local anesthetic). Surgery. If surgery is needed, it will be done after the swelling is gone. Surgery will stabilize and align the fracture. Follow these instructions at home:     Activity Return to your normal activities as told by your health care provider. Ask your health care provider what activities are safe for you. Do not play contact sports for 3-4 weeks or as told by your health care provider. Managing pain and swelling If directed, put ice on the injured area. To do this: Put ice in a plastic bag. Place a towel between your skin and the bag. Leave the ice on for 20 minutes, 2-3 times a day. Take off the ice if your skin turns bright red. This is very important. If you cannot feel pain, heat, or cold, you have a greater risk of damage to the area. General instructions Take over-the-counter and prescription medicines only as told  by your health care provider. If your nose starts to bleed, sit in an upright position while you squeeze the soft parts of your nose against the dividing wall between your nostrils (septum) for 10 minutes. Try to avoid blowing your nose. Keep all follow-up visits. This is important. Contact a health care provider if: Your pain increases or becomes severe. You continue to have nosebleeds. The shape of your nose does not return to normal within 5  days. You have pus draining out of your nose. Get help right away if: You have bleeding from your nose that does not stop after you pinch your nostrils closed for 20 minutes and keep ice on your nose. You have clear fluid draining out of your nose. You notice swelling near the septum inside the nose. This swelling is a septal hematoma that must be drained to help prevent infection. You have difficulty moving your eyes. You have repeated vomiting. These symptoms may be an emergency. Get help right away. Call 911. Do not wait to see if the symptoms will go away. Do not drive yourself to the hospital. Summary A nasal fracture is a break or crack in the bones or cartilage of the nose. The fracture is usually caused by a blunt injury to the nose. Symptoms include pain, swelling, and facial bruising. Nasal fractures may heal on their own, or your health care provider may need to move the bones back into proper position. In some cases, surgery may be needed. This information is not intended to replace advice given to you by your health care provider. Make sure you discuss any questions you have with your health care provider. Document Revised: 06/25/2021 Document Reviewed: 06/25/2021 Elsevier Patient Education  [place].

## 2023-02-15 ENCOUNTER — Ambulatory Visit (INDEPENDENT_AMBULATORY_CARE_PROVIDER_SITE_OTHER)
Admission: RE | Admit: 2023-02-15 | Discharge: 2023-02-15 | Disposition: A | Discharge status: Home / Self Care | Payer: Medicare Other | Source: Ambulatory Visit | Attending: Family Medicine | Admitting: Family Medicine

## 2023-02-15 DIAGNOSIS — M545 Low back pain, unspecified: Secondary | ICD-10-CM

## 2023-02-15 DIAGNOSIS — R0789 Other chest pain: Secondary | ICD-10-CM | POA: Diagnosis not present

## 2023-02-15 DIAGNOSIS — S2232XA Fracture of one rib, left side, initial encounter for closed fracture: Secondary | ICD-10-CM | POA: Diagnosis not present

## 2023-02-16 ENCOUNTER — Other Ambulatory Visit: Payer: Self-pay | Admitting: Family Medicine

## 2023-02-16 DIAGNOSIS — F418 Other specified anxiety disorders: Secondary | ICD-10-CM

## 2023-02-16 DIAGNOSIS — E034 Atrophy of thyroid (acquired): Secondary | ICD-10-CM

## 2023-02-16 DIAGNOSIS — G250 Essential tremor: Secondary | ICD-10-CM

## 2023-02-17 ENCOUNTER — Telehealth: Payer: Self-pay | Admitting: Family Medicine

## 2023-02-17 ENCOUNTER — Other Ambulatory Visit: Payer: Self-pay | Admitting: Family Medicine

## 2023-02-17 ENCOUNTER — Other Ambulatory Visit: Payer: Self-pay | Admitting: Cardiology

## 2023-02-17 ENCOUNTER — Other Ambulatory Visit: Payer: Self-pay

## 2023-02-17 DIAGNOSIS — R809 Proteinuria, unspecified: Secondary | ICD-10-CM

## 2023-02-17 DIAGNOSIS — R7989 Other specified abnormal findings of blood chemistry: Secondary | ICD-10-CM

## 2023-02-17 DIAGNOSIS — I5042 Chronic combined systolic (congestive) and diastolic (congestive) heart failure: Secondary | ICD-10-CM

## 2023-02-17 DIAGNOSIS — R052 Subacute cough: Secondary | ICD-10-CM

## 2023-02-17 DIAGNOSIS — I10 Essential (primary) hypertension: Secondary | ICD-10-CM

## 2023-02-17 MED ORDER — ARNUITY ELLIPTA 100 MCG/ACT IN AEPB: 1.0000 | INHALATION_SPRAY | Freq: Every day | RESPIRATORY_TRACT | 5 refills | Status: DC

## 2023-02-17 MED ORDER — SPIRONOLACTONE 25 MG PO TABS: 25.0000 mg | ORAL_TABLET | Freq: Every day | ORAL | 1 refills | Status: DC

## 2023-02-17 MED ORDER — LANTUS SOLOSTAR 100 UNIT/ML ~~LOC~~ SOPN: 54.0000 [IU] | PEN_INJECTOR | Freq: Every day | SUBCUTANEOUS | 1 refills | Status: DC

## 2023-02-17 NOTE — Telephone Encounter (Signed)
Arnuity ordered, daily dosing, not twice daily.  Let me know if there are questions.

## 2023-02-17 NOTE — Telephone Encounter (Signed)
Caller name: Loyal Jacobson  On DPR?: Yes  Call back number: [phone]  Provider they see: Wendie Agreste, MD  Reason for call: 2 Alternatives to Flovent (which they no longer cover): Arnuity 50/100/200 mcg OR Qvar 40/80 mcg   [id number] - [place], [place] - [place]

## 2023-02-17 NOTE — Addendum Note (Signed)
Addended by: Merri Ray R on: 02/17/2023 05:50 PM   Modules accepted: Orders

## 2023-02-17 NOTE — Telephone Encounter (Signed)
Pt needs an alternative please review and send thank you!

## 2023-02-19 ENCOUNTER — Other Ambulatory Visit: Payer: Self-pay | Admitting: Family Medicine

## 2023-02-19 NOTE — Telephone Encounter (Signed)
Encourage patient to contact the pharmacy for refills or they can request refills through [place] TO:  [id number] - [place], [place]: HYDROcodone-acetaminophen HYDROcodone-acetaminophen (NORCO/VICODIN) 5-325 MG tablet  NOTES/COMMENTS FROM PATIENT:      [place] office please notify patient: It takes 48-72 hours to process rx refill requests Ask patient to call pharmacy to ensure rx is ready before heading there.

## 2023-02-19 NOTE — Telephone Encounter (Signed)
Patient is requesting a refill of the following medications: Requested Prescriptions   Pending Prescriptions Disp Refills   HYDROcodone-acetaminophen (NORCO/VICODIN) 5-325 MG tablet 12 tablet 0    Sig: Take 1 tablet by mouth every 6 (six) hours as needed.    Date of patient request: 02/19/23 Last office visit: 02/12/23 Date of last refill: 02/03/23 Last refill amount: 12   Pt reported 02/12/23 visit she was not taking rx, please advise

## 2023-02-20 MED ORDER — HYDROCODONE-ACETAMINOPHEN 5-325 MG PO TABS: 1.0000 | ORAL_TABLET | Freq: Four times a day (QID) | ORAL | 0 refills | Status: DC | PRN

## 2023-02-20 NOTE — Telephone Encounter (Signed)
Controlled substance database reviewed.  Hydrocodone last filled 02/03/2023.  Under treatment for injuries discussed at recent visit.  Will refill hydrocodone.

## 2023-02-22 ENCOUNTER — Telehealth: Payer: Self-pay | Admitting: Family Medicine

## 2023-02-22 ENCOUNTER — Emergency Department (HOSPITAL_COMMUNITY)
Admission: EM | Admit: 2023-02-22 | Discharge: 2023-02-22 | Disposition: A | Discharge status: Home / Self Care | Payer: Medicare Other | Source: Home / Self Care

## 2023-02-22 NOTE — Telephone Encounter (Signed)
Medication was ordered this weekend, please verify if she received it.

## 2023-02-22 NOTE — Telephone Encounter (Signed)
I spoke to the pt and she is aware the medication was sent in and she will pick up today

## 2023-02-22 NOTE — Telephone Encounter (Signed)
Initial Comment Caller states they would like some medication to be called in. They are supposed to be taking that. They do not have some for the weekend. Translation No Nurse Assessment Nurse: Kirk Ruths, RN, [facility] Date/Time (Eastern Time): 02/20/2023 9:47:09 AM Confirm and document reason for call. If symptomatic, describe symptoms. ---Caller states she is out of hydrocodone and needs it called in She is having painin her back and she is out of medication for weekend. She has broken ribs, collarbone ,and other injuries. she states she is not using otc meds but she needs the pain medications. Does the patient have any new or worsening symptoms? ---Yes Will a triage be completed? ---Yes Related visit to physician within the last 2 weeks? ---No Does the PT have any chronic conditions? (i.e. diabetes, asthma, this includes High risk factors for pregnancy, etc.) ---Yes List chronic conditions. ---chronic pain Is this a behavioral health or substance abuse call? ---No Disp. Time Eilene Ghazi Time) Disposition Final User 02/20/2023 9:54:33 AM Clinical Call Yes Kirk Ruths, RN, [facility] Final Disposition 02/20/2023 9:54:33 AM Clinical Call Yes Kirk Ruths, RN, [facility] PLEASE NOTE: All timestamps contained within this report are represented as [place] Standard Time. CONFIDENTIALTY NOTICE: This fax transmission is intended only for the addressee. It contains information that is legally privileged, confidential or otherwise protected from use or disclosure. If you are not the intended recipient, you are strictly prohibited from reviewing, disclosing, copying using or disseminating any of this information or taking any action in reliance on or regarding this information. If you have received this fax in error, please notify us immediately by telephone so that we can arrange for its return to [place]. Phone: [phone], Toll-Free: [phone], Fax: [phone] Page: 2 of 2 Call Id: [id number] [place] Disagree/Comply  Disagree Caller Understands Yes PreDisposition Call Doctor Comments User: Tawni Levy, RN Date/Time Eilene Ghazi Time): 02/20/2023 9:55:04 AM Patient declines triage ststing she just wants her meds (oxycodone) called in

## 2023-02-22 NOTE — Telephone Encounter (Signed)
Pt aware Rx has been sent in.  

## 2023-02-23 ENCOUNTER — Telehealth: Payer: Self-pay | Admitting: Family Medicine

## 2023-02-23 NOTE — Telephone Encounter (Signed)
Patient husband called back stating his wife is sleep. Husband will have his wife call us when she get up.

## 2023-02-23 NOTE — Telephone Encounter (Signed)
Initial Comment Caller states she has questions about her knee and bandages. She noticed a lot of puss. She has a fever. She is taking abx twice a day. She would like to know when she needs to be seen again. Translation No Nurse Assessment Nurse: Mariella Saa, RN, [place] Date/Time (Eastern Time): 02/22/2023 5:08:17 PM Confirm and document reason for call. If symptomatic, describe symptoms. ---Caller states she has questions about her knee and bandages. She noticed a lot of pus. She says wound has been warm to touch but not now, states out of dressing, was told to change BID, went for 3 days w/o dressing and could not find ride, refused offer for Uber from children, states scab was gone and pus under knee. She is taking abx twice a day, states almost out of abx. She would like to know when she needs to be seen again, states on Keflex BID, states wound is currently dressed, states had stitches to knee on 3/6 Does the patient have any new or worsening symptoms? ---Yes Will a triage be completed? ---Yes Related visit to physician within the last 2 weeks? ---No Does the PT have any chronic conditions? (i.e. diabetes, asthma, this includes High risk factors for pregnancy, etc.) ---Yes List chronic conditions. ---anxiety, diabetes, heart pt, Is this a behavioral health or substance abuse call? ---No PLEASE NOTE: All timestamps contained within this report are represented as [place] Standard Time. CONFIDENTIALTY NOTICE: This fax transmission is intended only for the addressee. It contains information that is legally privileged, confidential or otherwise protected from use or disclosure. If you are not the intended recipient, you are strictly prohibited from reviewing, disclosing, copying using or disseminating any of this information or taking any action in reliance on or regarding this information. If you have received this fax in error, please notify us immediately by telephone so that we can  arrange for its return to [place]. Phone: [phone], Toll-Free: [phone], Fax: [phone] Page: 2 of 2 Call Id: [id number] Guidelines Guideline Title Affirmed Question Affirmed Notes Nurse Date/Time Eilene Ghazi Time) Wound Infection on Antibiotic Follow-up Call Patient sounds very sick or weak to the triager [place], RN, [facility] 02/22/2023 5:18:49 PM Disp. Time Eilene Ghazi Time) Disposition Final User 02/22/2023 5:25:28 PM Go to ED Now (or PCP triage) Yes Mariella Saa, RN, Melissa Final Disposition 02/22/2023 5:25:28 PM Go to ED Now (or PCP triage) Yes [place], RN, Melissa Caller Disagree/Comply Comply Caller Understands Yes PreDisposition InappropriateToAsk Care Advice Given Per Guideline GO TO ED NOW (OR PCP TRIAGE): ANOTHER ADULT SHOULD DRIVE: * It is better and safer if another adult drives instead of you. CARE ADVICE per Wound Infection on Antibiotic Follow-up Call (Adult) guideline. Comments User: Loyola Mast, RN Date/Time Eilene Ghazi Time): 02/22/2023 5:16:46 PM states has fractures in arms User: Loyola Mast, RN Date/Time Eilene Ghazi Time): 02/22/2023 5:20:30 PM states rash to R arm, started 2-3 days ago, User: Loyola Mast, RN Date/Time Eilene Ghazi Time): 02/22/2023 5:26:05 PM states will call granddaughter to come get her Referrals [facility] - ED   Called patient to check on her  no answer, LM for patient to call back. If she has any question concerns.

## 2023-02-23 NOTE — Telephone Encounter (Signed)
Spoke to the pt and she states she thinks her knee has a fever to it .states it is red and she has stitches in the knee. She is scheduled to see Dr Carlota Raspberry tomorrow

## 2023-02-24 ENCOUNTER — Encounter: Payer: Self-pay | Admitting: Family Medicine

## 2023-02-24 ENCOUNTER — Ambulatory Visit (INDEPENDENT_AMBULATORY_CARE_PROVIDER_SITE_OTHER): Payer: Medicare Other | Admitting: Family Medicine

## 2023-02-24 VITALS — BP 122/88 | HR 85 | Temp 98.6°F | Ht 60.0 in | Wt 144.2 lb

## 2023-02-24 DIAGNOSIS — M545 Low back pain, unspecified: Secondary | ICD-10-CM | POA: Diagnosis not present

## 2023-02-24 DIAGNOSIS — S42201D Unspecified fracture of upper end of right humerus, subsequent encounter for fracture with routine healing: Secondary | ICD-10-CM

## 2023-02-24 DIAGNOSIS — S81011D Laceration without foreign body, right knee, subsequent encounter: Secondary | ICD-10-CM

## 2023-02-24 DIAGNOSIS — S2232XD Fracture of one rib, left side, subsequent encounter for fracture with routine healing: Secondary | ICD-10-CM | POA: Diagnosis not present

## 2023-02-24 DIAGNOSIS — S022XXD Fracture of nasal bones, subsequent encounter for fracture with routine healing: Secondary | ICD-10-CM

## 2023-02-24 MED ORDER — CEPHALEXIN 500 MG PO CAPS: 500.0000 mg | ORAL_CAPSULE | Freq: Two times a day (BID) | ORAL | 0 refills | Status: DC

## 2023-02-24 NOTE — Patient Instructions (Addendum)
Tylenol is fine to use for pain, glad to hear that back and rib pain is better. If stronger med needed,   Here is office info for ENT specialist to follow up nasal fracture, please call for appointment: [facility] [address], [place], [place] [address] [phone]   Restart antibiotic, twice per day.  Okay to cleanse wound gently with just soap and water, and apply clean bandage daily.  That wound over the knee will have to heal with new tissue filling in from below, that can take some time but want to make sure there is no infection in the meantime.  If you notice any new or worsening redness prior to follow-up in 5 days, be seen.  Take care.   Rib Fracture  A rib fracture is a break or crack in one of the bones of the ribs. The ribs are long, curved bones that wrap around your chest and attach to your spine and your breastbone. The ribs protect your heart, lungs, and other organs in the chest. A broken or cracked rib is often painful but is not usually serious. Most rib fractures heal on their own over time. However, rib fractures can be more serious if multiple ribs are broken or if broken ribs move out of place and push against other structures or organs. What are the causes? This condition is caused by: Repetitive movements with high force, such as pitching a baseball or having very bad coughing spells. A direct hit the chest, such as a sports injury, a car crash, or a fall. Cancer that has spread to the bones, which can weaken bones and cause them to break. What are the signs or symptoms? Symptoms of this condition include: Pain when you breathe in or cough. Pain when someone presses on the injured area. Feeling short of breath. How is this diagnosed? This condition is diagnosed with a physical exam and medical history. You may also have imaging tests, such as: Chest X-ray. CT scan. MRI. Bone scan. Chest ultrasound. How is this  treated? Treatment for this condition depends on the severity of the fracture. Most rib fractures usually heal on their own in 1-3 months. Healing may take longer if you have a cough or if you do activities that make the injury worse. While you heal, you may be given medicines to control the pain. You will also be taught deep breathing exercises. Severe injuries may require hospitalization or surgery. Follow these instructions at home: Managing pain, stiffness, and swelling If directed, put ice on the injured area. To do this: Put ice in a plastic bag. Place a towel between your skin and the bag. Leave the ice on for 20 minutes, 2-3 times a day. Remove the ice if your skin turns bright red. This is very important. If you cannot feel pain, heat, or cold, you have a greater risk of damage to the area. Take over-the-counter and prescription medicines only as told by your health care provider. Activity Avoid doing activities or movements that cause pain. Be careful during activities and avoid bumping the injured rib. Slowly increase your activity as told by your health care provider. General instructions Do deep breathing exercises as told by your health care provider. This helps prevent pneumonia, which is a common complication of a broken rib. Your health care provider may instruct you to: Take deep breaths several times a day. Try to cough several times a day, holding a pillow against the injured  area. Use a device called incentive spirometer to practice deep breathing several times a day. Drink enough fluid to keep your urine pale yellow. Do not wear a rib belt or binder. These restrict breathing, which can lead to pneumonia. Keep all follow-up visits. This is important. Contact a health care provider if: You have a fever. Get help right away if: You have difficulty breathing or you are short of breath. You develop a cough that does not stop, or you cough up thick or bloody sputum. You  have nausea, vomiting, or pain in your abdomen. Your pain gets worse and medicine does not help. These symptoms may represent a serious problem that is an emergency. Do not wait to see if the symptoms will go away. Get medical help right away. Call your local emergency services (911 in the [place].S[place] Do not drive yourself to the hospital. Summary A rib fracture is a break or crack in one of the bones of the ribs. A broken or cracked rib is often painful but is not usually serious. Most rib fractures heal on their own over time. Treatment for this condition depends on the severity of the fracture. Avoid doing any activities or movements that cause pain. This information is not intended to replace advice given to you by your health care provider. Make sure you discuss any questions you have with your health care provider. Document Revised: 03/08/2020 Document Reviewed: 03/08/2020 Elsevier Patient Education  [place].
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
## 2023-02-24 NOTE — Addendum Note (Signed)
Addended by: Merri Ray R on: 02/24/2023 05:27 PM   Modules accepted: Orders

## 2023-02-25 ENCOUNTER — Telehealth: Payer: Self-pay | Admitting: Family Medicine

## 2023-02-25 ENCOUNTER — Ambulatory Visit
Admission: RE | Admit: 2023-02-25 | Discharge: 2023-02-25 | Disposition: A | Discharge status: Home / Self Care | Payer: Medicare Other | Source: Ambulatory Visit | Attending: Orthopedic Surgery | Admitting: Orthopedic Surgery

## 2023-02-25 DIAGNOSIS — S42294A Other nondisplaced fracture of upper end of right humerus, initial encounter for closed fracture: Secondary | ICD-10-CM

## 2023-02-25 DIAGNOSIS — S42254A Nondisplaced fracture of greater tuberosity of right humerus, initial encounter for closed fracture: Secondary | ICD-10-CM | POA: Diagnosis not present

## 2023-02-25 NOTE — Telephone Encounter (Signed)
[employer], verbal order to start 4/1

## 2023-02-25 NOTE — Telephone Encounter (Signed)
Pt would like to start care on 03/01/23 is this is ok ?

## 2023-02-25 NOTE — Telephone Encounter (Signed)
Caller name: [place]   On [place]?: Yes  Call back number: [phone]  Provider they see: Wendie Agreste, MD  Reason for call: Pt wanted to start care March 01, 2023

## 2023-02-27 LAB — WOUND CULTURE
MICRO NUMBER:: 14749962
SPECIMEN QUALITY:: ADEQUATE

## 2023-03-01 ENCOUNTER — Telehealth: Payer: Self-pay

## 2023-03-01 ENCOUNTER — Other Ambulatory Visit: Payer: Self-pay | Admitting: Family Medicine

## 2023-03-01 DIAGNOSIS — E119 Type 2 diabetes mellitus without complications: Secondary | ICD-10-CM | POA: Diagnosis not present

## 2023-03-01 DIAGNOSIS — S81011D Laceration without foreign body, right knee, subsequent encounter: Secondary | ICD-10-CM | POA: Diagnosis not present

## 2023-03-01 DIAGNOSIS — E559 Vitamin D deficiency, unspecified: Secondary | ICD-10-CM | POA: Diagnosis not present

## 2023-03-01 DIAGNOSIS — E039 Hypothyroidism, unspecified: Secondary | ICD-10-CM | POA: Diagnosis not present

## 2023-03-01 DIAGNOSIS — Z9181 History of falling: Secondary | ICD-10-CM | POA: Diagnosis not present

## 2023-03-01 DIAGNOSIS — F32A Depression, unspecified: Secondary | ICD-10-CM | POA: Diagnosis not present

## 2023-03-01 DIAGNOSIS — M545 Low back pain, unspecified: Secondary | ICD-10-CM | POA: Diagnosis not present

## 2023-03-01 DIAGNOSIS — I5042 Chronic combined systolic (congestive) and diastolic (congestive) heart failure: Secondary | ICD-10-CM | POA: Diagnosis not present

## 2023-03-01 DIAGNOSIS — I110 Hypertensive heart disease with heart failure: Secondary | ICD-10-CM | POA: Diagnosis not present

## 2023-03-01 DIAGNOSIS — S42301D Unspecified fracture of shaft of humerus, right arm, subsequent encounter for fracture with routine healing: Secondary | ICD-10-CM | POA: Diagnosis not present

## 2023-03-01 DIAGNOSIS — G250 Essential tremor: Secondary | ICD-10-CM | POA: Diagnosis not present

## 2023-03-01 DIAGNOSIS — Z794 Long term (current) use of insulin: Secondary | ICD-10-CM | POA: Diagnosis not present

## 2023-03-01 DIAGNOSIS — J449 Chronic obstructive pulmonary disease, unspecified: Secondary | ICD-10-CM | POA: Diagnosis not present

## 2023-03-01 DIAGNOSIS — G4730 Sleep apnea, unspecified: Secondary | ICD-10-CM | POA: Diagnosis not present

## 2023-03-01 DIAGNOSIS — J431 Panlobular emphysema: Secondary | ICD-10-CM | POA: Diagnosis not present

## 2023-03-01 DIAGNOSIS — K219 Gastro-esophageal reflux disease without esophagitis: Secondary | ICD-10-CM | POA: Diagnosis not present

## 2023-03-01 DIAGNOSIS — S2232XA Fracture of one rib, left side, initial encounter for closed fracture: Secondary | ICD-10-CM | POA: Diagnosis not present

## 2023-03-01 DIAGNOSIS — S022XXD Fracture of nasal bones, subsequent encounter for fracture with routine healing: Secondary | ICD-10-CM | POA: Diagnosis not present

## 2023-03-01 DIAGNOSIS — Z7982 Long term (current) use of aspirin: Secondary | ICD-10-CM | POA: Diagnosis not present

## 2023-03-01 DIAGNOSIS — E7800 Pure hypercholesterolemia, unspecified: Secondary | ICD-10-CM | POA: Diagnosis not present

## 2023-03-01 MED ORDER — DOXYCYCLINE HYCLATE 100 MG PO TABS
100.0000 mg | ORAL_TABLET | Freq: Two times a day (BID) | ORAL | 0 refills | Status: DC
Start: 2023-03-01 — End: 2023-05-11

## 2023-03-01 NOTE — Telephone Encounter (Signed)
Informed pt of the lab results.

## 2023-03-01 NOTE — Telephone Encounter (Signed)
-----   Message from Wendie Agreste, MD sent at 03/01/2023  9:18 AM EDT -----  Call patient.  Antibiotic will need to be changed as wound culture indicated a bacteria that is likely resistant to the medicine that was prescribed. Start doxycycline 1 pill twice per day.  This is usually well-tolerated but sometimes do see some stomach upset so may need to take with food.  This also can increase risk of sunburn so be careful taking that medication.  Let me know if there are questions.

## 2023-03-01 NOTE — Progress Notes (Signed)
See lab note.  

## 2023-03-03 ENCOUNTER — Encounter: Payer: Self-pay | Admitting: Neurology

## 2023-03-03 ENCOUNTER — Ambulatory Visit: Payer: Medicare Other | Admitting: Neurology

## 2023-03-03 VITALS — BP 122/63 | HR 81 | Ht 60.0 in | Wt 143.0 lb

## 2023-03-03 DIAGNOSIS — G250 Essential tremor: Secondary | ICD-10-CM | POA: Diagnosis not present

## 2023-03-03 DIAGNOSIS — F341 Dysthymic disorder: Secondary | ICD-10-CM

## 2023-03-03 DIAGNOSIS — F339 Major depressive disorder, recurrent, unspecified: Secondary | ICD-10-CM | POA: Insufficient documentation
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
## 2023-03-04 ENCOUNTER — Telehealth: Payer: Self-pay | Admitting: Family Medicine

## 2023-03-04 DIAGNOSIS — E1165 Type 2 diabetes mellitus with hyperglycemia: Secondary | ICD-10-CM

## 2023-03-04 DIAGNOSIS — E661 Drug-induced obesity: Secondary | ICD-10-CM

## 2023-03-04 MED ORDER — TRULICITY 0.75 MG/0.5ML ~~LOC~~ SOAJ: 0.7500 mg | SUBCUTANEOUS | 2 refills | Status: DC

## 2023-03-04 NOTE — Telephone Encounter (Signed)
Note reviewed from specialty pharmacy regarding Trulicity, 1.5 mg temporarily unavailable.  Based on her last A1c and recent blood sugars I think it would be reasonable to try the 0.75 mg dose for now but will need some updated labs.  I will send in that new prescription.  Placed in fax folder.   Additionally at her February 27 visit,  plan to see her in 5 days for her right knee wound and I do not see an appointment scheduled.  Should be seen either tomorrow or Monday if possible.

## 2023-03-05 ENCOUNTER — Ambulatory Visit: Payer: Medicare Other | Admitting: Family Medicine

## 2023-03-05 NOTE — Telephone Encounter (Signed)
Called patient to schedule her appt for today or Monday, no answer. LM for patient to call back to schedule appt for today or Monday. I will hold your 11:20 spot for her, just in case she can make it here today.

## 2023-03-05 NOTE — Telephone Encounter (Signed)
Appt made for 11:20

## 2023-03-08 ENCOUNTER — Ambulatory Visit (INDEPENDENT_AMBULATORY_CARE_PROVIDER_SITE_OTHER): Payer: Medicare Other | Admitting: Family Medicine

## 2023-03-08 VITALS — BP 122/68 | HR 86 | Temp 98.2°F | Ht 60.0 in | Wt 143.2 lb

## 2023-03-08 DIAGNOSIS — R479 Unspecified speech disturbances: Secondary | ICD-10-CM

## 2023-03-08 DIAGNOSIS — R809 Proteinuria, unspecified: Secondary | ICD-10-CM | POA: Diagnosis not present

## 2023-03-08 DIAGNOSIS — M79661 Pain in right lower leg: Secondary | ICD-10-CM | POA: Diagnosis not present

## 2023-03-08 DIAGNOSIS — Z794 Long term (current) use of insulin: Secondary | ICD-10-CM | POA: Diagnosis not present

## 2023-03-08 DIAGNOSIS — S81011D Laceration without foreign body, right knee, subsequent encounter: Secondary | ICD-10-CM | POA: Diagnosis not present

## 2023-03-08 DIAGNOSIS — M6281 Muscle weakness (generalized): Secondary | ICD-10-CM

## 2023-03-08 DIAGNOSIS — E1129 Type 2 diabetes mellitus with other diabetic kidney complication: Secondary | ICD-10-CM | POA: Diagnosis not present

## 2023-03-08 DIAGNOSIS — M25471 Effusion, right ankle: Secondary | ICD-10-CM

## 2023-03-08 NOTE — Patient Instructions (Addendum)
Knee is improving. Ok to clean over area with soap and water.  Xray for lower leg when you are able -  [facility] or xray: Walk in 8:30-4:30 during weekdays, no appointment needed [address]  [place], [place] [address] Call Dr. Dion Saucier to discuss follow up of your shoulder and to discuss the ct scan.  I will work on another referral to neurology.   Recheck in 2 weeks.   Return to the clinic or go to the nearest emergency room if any of your symptoms worsen or new symptoms occur.

## 2023-03-08 NOTE — Progress Notes (Unsigned)
Subjective:  Patient ID: Felicia Hanson, female    DOB: 1955-12-30  Age: 67 y.o. MRN: [id number]  CC:  Chief Complaint  Patient presents with   Knee Injury    Pt states her knee has been healing really well states she has one antibiotic left. States she has some swelling and redness in her right ankle as well.    HPI Felicia Hanson presents for   R knee pain, wound: See prior visits, follow-up from March 27 visit.  Laceration repair on March 7th with absorbable sutures. R knee XR on 3/6: IMPRESSION:No acute bony abnormality. Had treated with Keflex 500 mg twice daily at 3/15  visit and had completed that course 2 days prior.  Concerned about possible infection from family member prior to her 3/27 visit.  On exam she did have some exudate present as well as some granulation tissue.  Some surrounding erythema, warmth.  Wound culture obtained and initially started on Keflex repeat course.  Continued wound care with soap and water and clean bandage.  Wound culture indicated MRSA, changed to doxycycline on 03/01/2023.  Has been taking the doxycycline - has 1 more left. Upset stomach past few days. No fever.  Cleaning with soap/water daily, then bandage. wound appears to be shrinking in size.  No knee pain, some soreness below the knee in the front. Occasional soreness in front of knee. Able to walk.   Right ankle swelling Family has noticed that R ankle appears swollen and discoloration. Past few days. No pain. No injury. Had wrapped lower leg 4-5 days ago, maybe wrapped too tight.   Here with sister in law Felicia Hanson. Some additional questions/concerns today.  Not happy with neuro visit. Would like to see different neurologist.  Question form Felicia Hanson about MRI/MRA brain with initial injury - noted that CT head and maxillofacial bones was ordered in ER. They plan to discuss MRI and MRA with neurology.  Question about orthopaedic follow up -  Had CT of R shoulder on 02/25/23.  Unknown if follow up  appt with Dr. Dion Saucier. Has not seen him since CT scan. Plans to call his office to determine follow up plan/appointment.   Trulicity question: Needs refill. See recent telephone message. 1.5mg  trulicity unavailable at her pharmacy. Based on her  last A1c and recent blood sugars, authorized 0.75mg  dose in the interim. Plan for updated labs.    History Patient Active Problem List   Diagnosis Date Noted   Persistent depressive disorder 03/03/2023   Benign essential tremor 05/25/2022   Shortness of breath at rest 05/09/2021   Loud snoring 03/24/2021   Excessive daytime sleepiness 03/24/2021   Acute on chronic combined systolic and diastolic HF (heart failure) 03/24/2021   Panlobular emphysema 03/24/2021   GAD (generalized anxiety disorder) 03/24/2021   Atypical chest pain 01/22/2021   Respiratory distress 01/20/2021   Acute on chronic combined systolic and diastolic CHF (congestive heart failure) 01/20/2021   Type 2 diabetes mellitus without complication, without long-term current use of insulin 10/26/2017   Generalized anxiety disorder 01/10/2014   Vitamin D deficiency 01/10/2014   Essential hypertension, benign 01/10/2014   Pure hypercholesterolemia 01/10/2014   Hypothyroidism 01/10/2014   Past Medical History:  Diagnosis Date   Allergy    Anxiety    CHF (congestive heart failure) 01/20/2021   COPD (chronic obstructive pulmonary disease)    Coronary artery calcification    Depression    Diabetes mellitus without complication    Diverticulitis    with  colon resection   GERD (gastroesophageal reflux disease)    on pepcid   Hyperlipidemia    Hypertension    Sleep apnea    cpap on order 01-01-22   Sleep paralysis    will be tested for narcolepsy per pt 01-01-22   Thyroid disease    Past Surgical History:  Procedure Laterality Date   BREAST EXCISIONAL BIOPSY Left    CESAREAN SECTION     x2   CHOLECYSTECTOMY     COLON SURGERY     diverticulitis with perforation; s/p colon  resection.   Colonoscopy     COLONOSCOPY     DENTAL SURGERY     RIGHT/LEFT HEART CATH AND CORONARY ANGIOGRAPHY N/A 01/22/2021   Procedure: RIGHT/LEFT HEART CATH AND CORONARY ANGIOGRAPHY;  Surgeon: Yates Decamp, MD;  Location: [facility];  Service: Cardiovascular;  Laterality: N/A;   TUBAL LIGATION     Allergies  Allergen Reactions   Metformin And Related Other (See Comments)    Could not swallow   Nicotine Rash    Only allergic to nicotine patch   Ace Inhibitors Cough    Cough with lisinopril.    Prior to Admission medications   Medication Sig Start Date End Date Taking? Authorizing Provider  ACCU-CHEK AVIVA PLUS test strip daily. for testing 03/11/18  Yes [provider]  albuterol (VENTOLIN HFA) 108 (90 Base) MCG/ACT inhaler Inhale 1 puff into the lungs every 6 (six) hours as needed for wheezing or shortness of breath.   Yes [provider]  aspirin 81 MG tablet Take 81 mg by mouth daily.   Yes [provider]  Azelastine HCl 137 MCG/SPRAY SOLN PLACE 1 SPRAY INTO BOTH NOSTRILS 2 (TWO) TIMES DAILY. USE IN EACH NOSTRIL AS DIRECTED IF RUNNY NOSE/DRAINAGE. 05/26/22  Yes Shade Flood, MD  BLACK ELDERBERRY PO Take 10 mLs by mouth daily.   Yes [provider]  cephALEXin (KEFLEX) 500 MG capsule Take 1 capsule (500 mg total) by mouth 2 (two) times daily. 02/24/23  Yes Shade Flood, MD  cetirizine (ZYRTEC) 10 MG tablet Take 10 mg by mouth daily.   Yes [provider]  Continuous Blood Gluc Receiver (DEXCOM G6 RECEIVER) DEVI 1 Device by Does not apply route every morning. Device and supplies 04/18/20  Yes Shade Flood, MD  dapagliflozin propanediol (FARXIGA) 10 MG TABS tablet Take 1 tablet (10 mg total) by mouth daily. 04/30/22  Yes Tolia, Sunit, DO  doxycycline (VIBRA-TABS) 100 MG tablet Take 1 tablet (100 mg total) by mouth 2 (two) times daily. 03/01/23  Yes Shade Flood, MD  Dulaglutide (TRULICITY) 0.75 MG/0.5ML SOPN Inject 0.75  mg into the skin once a week. 03/04/23  Yes Shade Flood, MD  DULoxetine (CYMBALTA) 60 MG capsule TAKE 2 CAPSULES BY MOUTH DAILY 02/17/23  Yes Shade Flood, MD  ezetimibe (ZETIA) 10 MG tablet TAKE 1 TABLET BY MOUTH EVERY DAY 01/25/23  Yes Shade Flood, MD  famotidine (PEPCID) 20 MG tablet Take 20 mg by mouth daily as needed for heartburn or indigestion.   Yes [provider]  fluticasone (FLONASE) 50 MCG/ACT nasal spray Place 2 sprays into both nostrils daily. 03/15/22  Yes Leath-Warren, Sadie Haber, NP  Fluticasone Furoate (ARNUITY ELLIPTA) 100 MCG/ACT AEPB Inhale 1 Inhalation into the lungs daily. 02/17/23  Yes Shade Flood, MD  HYDROcodone-acetaminophen (NORCO/VICODIN) 5-325 MG tablet Take 1 tablet by mouth every 6 (six) hours as needed. 02/20/23  Yes Shade Flood,  MD  insulin glargine (LANTUS SOLOSTAR) 100 UNIT/ML Solostar Pen Inject 54 Units into the skin at bedtime. 02/17/23  Yes Shade FloodGreene, Lilliana Turner R, MD  Insulin Pen Needle 31G X 5 MM MISC Inject 1 each into the skin daily. 02/10/21  Yes Shade FloodGreene, Janica Eldred R, MD  levothyroxine (SYNTHROID) 50 MCG tablet TAKE 1 TABLET BY MOUTH EVERY DAY 02/16/23  Yes Shade FloodGreene, Lakeyia Surber R, MD  magnesium oxide (MAG-OX) 400 MG tablet TAKE 0.5 TABLETS (200 MG TOTAL) BY MOUTH 2 TIMES DAILY. 02/17/23  Yes Tolia, Sunit, DO  metoprolol succinate (TOPROL-XL) 100 MG 24 hr tablet TAKE 1 TABLET BY MOUTH EVERY DAY WITH OR IMMEDIATELY FOLLOWING A MEAL 10/26/22  Yes Tolia, Sunit, DO  nirmatrelvir & ritonavir (PAXLOVID, 150/100,) 10 x 150 MG & 10 x 100MG  TBPK Take 150 mg nirmatrelvir and 100 mg ritonavir PO BID for 5 days 08/30/22  Yes Tomi BambergerMyers, Rebecca F, PA-C  nitroGLYCERIN (NITROSTAT) 0.4 MG SL tablet PLACE 1 TABLET UNDER THE TONGUE EVERY 5 MINUTES AS NEEDED FOR CHEST PAIN. IF YOU REQUIRE MORE THAN TWO TABLETS FIVE MINUTES APART GO TO THE NEAREST ER VIA EMS. Patient taking differently: Place 0.4 mg under the tongue every 5 (five) minutes as needed for chest pain.  07/29/20  Yes Tolia, Sunit, DO  omeprazole (PRILOSEC) 20 MG capsule TAKE 1 CAPSULE BY MOUTH EVERY DAY 01/11/23  Yes Shade FloodGreene, Trilby Way R, MD  ondansetron (ZOFRAN-ODT) 4 MG disintegrating tablet Take 1 tablet (4 mg total) by mouth every 8 (eight) hours as needed. 02/01/22  Yes Tomi BambergerMyers, Rebecca F, PA-C  pravastatin (PRAVACHOL) 80 MG tablet Take 1 tablet (80 mg total) by mouth daily. TAKE 1 TABLET BY MOUTH EVERY DAY IN THE EVENING 10/28/22  Yes Shade FloodGreene, Sumiko Ceasar R, MD  predniSONE (DELTASONE) 20 MG tablet Take 2 tablets (40 mg total) by mouth daily with breakfast. 03/26/22  Yes Shade FloodGreene, Leilanni Halvorson R, MD  promethazine-dextromethorphan (PROMETHAZINE-DM) 6.25-15 MG/5ML syrup Take 5 mLs by mouth 4 (four) times daily as needed for cough. 03/15/22  Yes Leath-Warren, Sadie Haberhristie J, NP  sacubitril-valsartan (ENTRESTO) 97-103 MG Take 1 tablet by mouth 2 (two) times daily. 12/03/22  Yes Tolia, Sunit, DO  spironolactone (ALDACTONE) 25 MG tablet Take 1 tablet (25 mg total) by mouth daily. 02/17/23  Yes Shade FloodGreene, Neizan Debruhl R, MD  topiramate (TOPAMAX) 50 MG tablet TAKE 1 TABLET BY MOUTH TWICE A DAY 02/16/23  Yes Shade FloodGreene, Hayato Guaman R, MD  Vitamin D, Ergocalciferol, (DRISDOL) 1.25 MG (50000 UNIT) CAPS capsule TAKE 1 CAPSULE (50,000 UNITS TOTAL) BY MOUTH EVERY 7 (SEVEN) DAYS 09/14/22  Yes Shade FloodGreene, Abigale Dorow R, MD  ALPRAZolam Prudy Feeler(XANAX) 1 MG tablet Take 0.5 mg by mouth 3 (three) times daily as needed for anxiety or sleep.  Patient not taking: Reported on 03/08/2023 08/21/13   [provider]  fenofibrate (TRICOR) 145 MG tablet Take 1 tablet (145 mg total) by mouth daily. 09/28/22 12/27/22  Tessa Lernerolia, Sunit, DO   Social History   Socioeconomic History   Marital status: Married    Spouse name: Not on file   Number of children: 2   Years of education: some college   Highest education level: Not on file  Occupational History   Occupation: Retired  Tobacco Use   Smoking status: Every Day    Packs/day: 0.25    Years: 38.00    Additional pack years:  0.00    Total pack years: 9.50    Types: Cigarettes   Smokeless tobacco: Never   Tobacco comments:    2-5 A DAY  Vaping Use   Vaping Use: Never used  Substance and Sexual Activity   Alcohol use: No    Alcohol/week: 0.0 standard drinks of alcohol   Drug use: No   Sexual activity: Not Currently  Other Topics Concern   Not on file  Social History Narrative   Marital status: married      Children:  2 children; 4 grandchildren      Lives: with husband, 2 granddaughters      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   Left-handed.   Caffeine use: 2.5 cups caffeine per day.   Social Determinants of Health   Financial Resource Strain: Low Risk  (06/17/2022)   Overall Financial Resource Strain (CARDIA)    Difficulty of Paying Living Expenses: Not hard at all  Food Insecurity: No Food Insecurity (02/08/2023)   Hunger Vital Sign    Worried About Running Out of Food in the Last Year: Never true    Ran Out of Food in the Last Year: Never true  Transportation Needs: No Transportation Needs (02/08/2023)   PRAPARE - Administrator, Civil Service (Medical): No    Lack of Transportation (Non-Medical): No  Physical Activity: Insufficiently Active (06/17/2022)   Exercise Vital Sign    Days of Exercise per Week: 3 days    Minutes of Exercise per Session: 30 min  Stress: No Stress Concern Present (06/17/2022)   [employer] of Occupational Health - Occupational Stress Questionnaire    Feeling of Stress : Not at all  Social Connections: Moderately Integrated (06/17/2022)   Social Connection and Isolation Panel [NHANES]    Frequency of Communication with Friends and Family: Three times a week    Frequency of Social Gatherings with Friends and Family: Three times a week    Attends Religious Services: More than 4 times per year    Active Member of Clubs or Organizations: No    Attends Banker Meetings: Never     Marital Status: Married  Catering manager Violence: Not At Risk (06/17/2022)   Humiliation, Afraid, Rape, and Kick questionnaire    Fear of Current or Ex-Partner: No    Emotionally Abused: No    Physically Abused: No    Sexually Abused: No    Review of Systems Per HPI.   Objective:   Vitals:   03/08/23 1528  BP: 122/68  Pulse: 86  Temp: 98.2 F (36.8 C)  TempSrc: Temporal  SpO2: 96%  Weight: 143 lb 3.2 oz (65 kg)  Height: 5' (1.524 m)     Physical Exam Constitutional:      General: She is not in acute distress.    Appearance: Normal appearance. She is well-developed.  HENT:     Head: Normocephalic and atraumatic.  Cardiovascular:     Rate and Rhythm: Normal rate.  Pulmonary:     Effort: Pulmonary effort is normal.  Musculoskeletal:     Comments: Pain free motion of R knee. TTP just below tibial tuberosity. No sts, erythema.   Right lower leg, ankle, foot no bony tenderness, pain-free range of motion.  Toes warm with cap refill less than 1 second.  No discoloration or cyanosis.  No appreciable swelling.  Skin:    Comments: See photo  Neurological:     Mental Status: She is alert and oriented to person, place, and time.  Psychiatric:  Mood and Affect: Mood normal.      *** minutes spent during visit, including chart review, counseling and assimilation of information, exam, discussion of plan, and chart completion.    Assessment & Plan:  SHARLISA KANATZAR is a 67 y.o. female . No diagnosis found.   No orders of the defined types were placed in this encounter.  There are no Patient Instructions on file for this visit.    Signed,   Meredith Staggers, MD [facility], [facility] 03/08/23 4:03 PM

## 2023-03-09 ENCOUNTER — Encounter: Payer: Self-pay | Admitting: Family Medicine

## 2023-03-09 ENCOUNTER — Encounter: Payer: Self-pay | Admitting: Neurology

## 2023-03-09 ENCOUNTER — Other Ambulatory Visit: Payer: Self-pay | Admitting: Family Medicine

## 2023-03-09 DIAGNOSIS — R7989 Other specified abnormal findings of blood chemistry: Secondary | ICD-10-CM

## 2023-03-11 DIAGNOSIS — E119 Type 2 diabetes mellitus without complications: Secondary | ICD-10-CM | POA: Diagnosis not present

## 2023-03-11 DIAGNOSIS — S022XXD Fracture of nasal bones, subsequent encounter for fracture with routine healing: Secondary | ICD-10-CM | POA: Diagnosis not present

## 2023-03-11 DIAGNOSIS — S42301D Unspecified fracture of shaft of humerus, right arm, subsequent encounter for fracture with routine healing: Secondary | ICD-10-CM | POA: Diagnosis not present

## 2023-03-11 DIAGNOSIS — M545 Low back pain, unspecified: Secondary | ICD-10-CM | POA: Diagnosis not present

## 2023-03-11 DIAGNOSIS — J431 Panlobular emphysema: Secondary | ICD-10-CM | POA: Diagnosis not present

## 2023-03-11 DIAGNOSIS — G250 Essential tremor: Secondary | ICD-10-CM | POA: Diagnosis not present

## 2023-03-11 DIAGNOSIS — E7800 Pure hypercholesterolemia, unspecified: Secondary | ICD-10-CM | POA: Diagnosis not present

## 2023-03-11 DIAGNOSIS — I110 Hypertensive heart disease with heart failure: Secondary | ICD-10-CM | POA: Diagnosis not present

## 2023-03-11 DIAGNOSIS — J449 Chronic obstructive pulmonary disease, unspecified: Secondary | ICD-10-CM | POA: Diagnosis not present

## 2023-03-11 DIAGNOSIS — E559 Vitamin D deficiency, unspecified: Secondary | ICD-10-CM | POA: Diagnosis not present

## 2023-03-11 DIAGNOSIS — I5042 Chronic combined systolic (congestive) and diastolic (congestive) heart failure: Secondary | ICD-10-CM | POA: Diagnosis not present

## 2023-03-11 DIAGNOSIS — S2232XA Fracture of one rib, left side, initial encounter for closed fracture: Secondary | ICD-10-CM | POA: Diagnosis not present

## 2023-03-11 DIAGNOSIS — Z794 Long term (current) use of insulin: Secondary | ICD-10-CM | POA: Diagnosis not present

## 2023-03-11 DIAGNOSIS — G4730 Sleep apnea, unspecified: Secondary | ICD-10-CM | POA: Diagnosis not present

## 2023-03-11 DIAGNOSIS — K219 Gastro-esophageal reflux disease without esophagitis: Secondary | ICD-10-CM | POA: Diagnosis not present

## 2023-03-11 DIAGNOSIS — Z7982 Long term (current) use of aspirin: Secondary | ICD-10-CM | POA: Diagnosis not present

## 2023-03-11 DIAGNOSIS — E039 Hypothyroidism, unspecified: Secondary | ICD-10-CM | POA: Diagnosis not present

## 2023-03-11 DIAGNOSIS — F32A Depression, unspecified: Secondary | ICD-10-CM | POA: Diagnosis not present

## 2023-03-11 DIAGNOSIS — S81011D Laceration without foreign body, right knee, subsequent encounter: Secondary | ICD-10-CM | POA: Diagnosis not present

## 2023-03-11 DIAGNOSIS — Z9181 History of falling: Secondary | ICD-10-CM | POA: Diagnosis not present

## 2023-03-12 ENCOUNTER — Ambulatory Visit (INDEPENDENT_AMBULATORY_CARE_PROVIDER_SITE_OTHER)
Admission: RE | Admit: 2023-03-12 | Discharge: 2023-03-12 | Disposition: A | Discharge status: Home / Self Care | Payer: Medicare Other | Source: Ambulatory Visit | Attending: Family Medicine | Admitting: Family Medicine

## 2023-03-12 DIAGNOSIS — M79661 Pain in right lower leg: Secondary | ICD-10-CM

## 2023-03-17 ENCOUNTER — Telehealth: Payer: Self-pay | Admitting: Pharmacist

## 2023-03-17 DIAGNOSIS — S81011D Laceration without foreign body, right knee, subsequent encounter: Secondary | ICD-10-CM | POA: Diagnosis not present

## 2023-03-17 DIAGNOSIS — I110 Hypertensive heart disease with heart failure: Secondary | ICD-10-CM | POA: Diagnosis not present

## 2023-03-17 DIAGNOSIS — E119 Type 2 diabetes mellitus without complications: Secondary | ICD-10-CM | POA: Diagnosis not present

## 2023-03-17 DIAGNOSIS — S2232XA Fracture of one rib, left side, initial encounter for closed fracture: Secondary | ICD-10-CM | POA: Diagnosis not present

## 2023-03-17 DIAGNOSIS — S42301D Unspecified fracture of shaft of humerus, right arm, subsequent encounter for fracture with routine healing: Secondary | ICD-10-CM | POA: Diagnosis not present

## 2023-03-17 DIAGNOSIS — M545 Low back pain, unspecified: Secondary | ICD-10-CM | POA: Diagnosis not present

## 2023-03-17 DIAGNOSIS — S022XXD Fracture of nasal bones, subsequent encounter for fracture with routine healing: Secondary | ICD-10-CM | POA: Diagnosis not present

## 2023-03-17 DIAGNOSIS — Z794 Long term (current) use of insulin: Secondary | ICD-10-CM | POA: Diagnosis not present

## 2023-03-17 DIAGNOSIS — J449 Chronic obstructive pulmonary disease, unspecified: Secondary | ICD-10-CM | POA: Diagnosis not present

## 2023-03-17 DIAGNOSIS — E039 Hypothyroidism, unspecified: Secondary | ICD-10-CM | POA: Diagnosis not present

## 2023-03-17 DIAGNOSIS — G4730 Sleep apnea, unspecified: Secondary | ICD-10-CM | POA: Diagnosis not present

## 2023-03-17 DIAGNOSIS — F32A Depression, unspecified: Secondary | ICD-10-CM | POA: Diagnosis not present

## 2023-03-17 DIAGNOSIS — E559 Vitamin D deficiency, unspecified: Secondary | ICD-10-CM | POA: Diagnosis not present

## 2023-03-17 DIAGNOSIS — G250 Essential tremor: Secondary | ICD-10-CM | POA: Diagnosis not present

## 2023-03-17 DIAGNOSIS — I5042 Chronic combined systolic (congestive) and diastolic (congestive) heart failure: Secondary | ICD-10-CM | POA: Diagnosis not present

## 2023-03-17 DIAGNOSIS — Z7982 Long term (current) use of aspirin: Secondary | ICD-10-CM | POA: Diagnosis not present

## 2023-03-17 DIAGNOSIS — K219 Gastro-esophageal reflux disease without esophagitis: Secondary | ICD-10-CM | POA: Diagnosis not present

## 2023-03-17 DIAGNOSIS — J431 Panlobular emphysema: Secondary | ICD-10-CM | POA: Diagnosis not present

## 2023-03-17 DIAGNOSIS — E7800 Pure hypercholesterolemia, unspecified: Secondary | ICD-10-CM | POA: Diagnosis not present

## 2023-03-17 DIAGNOSIS — Z9181 History of falling: Secondary | ICD-10-CM | POA: Diagnosis not present

## 2023-03-17 NOTE — Telephone Encounter (Signed)
This patient has been identified as "high risk" and in the top 25% of risk stratification for [facility] accountable patients.  These patients were identified using a number of factors including # of hospitalizations, ED visits, HF exacerbations, elevated BP and A1c, and overall cost of care.   Referral placed for cosign by the PCP.  [facility] team to schedule once cosigned.  Agam Davenport, PharmD Clinical Pharmacist  [place] [phone]  

## 2023-03-18 ENCOUNTER — Telehealth: Payer: Self-pay | Admitting: Family Medicine

## 2023-03-18 NOTE — Telephone Encounter (Signed)
See referral section, this has been ordered previously.  Should not need another referral.  Let me know if I can help further.

## 2023-03-18 NOTE — Telephone Encounter (Signed)
Caller name: SEELA SCHILZ  On DPR?: Yes  Call back number: [phone] (mobile)  Provider they see: Shade Flood, MD  Reason for call: Patient called to get a referral to see a ENT. Patient prefers Dr. Christia Reading with [facility]. Do this patient need an appt for this referral?

## 2023-03-18 NOTE — Telephone Encounter (Signed)
Spoke to the pt and she needs a referral for ENT for broke nose  She is asking if we can send her to DR Christia Reading w/ [facility]

## 2023-03-19 DIAGNOSIS — S52502D Unspecified fracture of the lower end of left radius, subsequent encounter for closed fracture with routine healing: Secondary | ICD-10-CM | POA: Diagnosis not present

## 2023-03-19 DIAGNOSIS — M25511 Pain in right shoulder: Secondary | ICD-10-CM | POA: Diagnosis not present

## 2023-03-19 DIAGNOSIS — M79645 Pain in left finger(s): Secondary | ICD-10-CM | POA: Diagnosis not present

## 2023-03-19 NOTE — Telephone Encounter (Signed)
Left vm to call office about referral.

## 2023-03-19 NOTE — Telephone Encounter (Signed)
Left vm for pt about referral.

## 2023-03-22 ENCOUNTER — Telehealth: Payer: Self-pay | Admitting: Pharmacist

## 2023-03-22 NOTE — Progress Notes (Signed)
Care Management & Coordination Services Pharmacy Team   Reason for Encounter: Appointment Reminder   Contacted patient to confirm in office appointment with Erskine Emery, PharmD on 03/24/23 at 11AM. Spoke with patient on 03/22/2023   Do you have any problems getting your medications? Yes If yes what types of problems are you experiencing?  Transportation  What is your top health concern you would like to discuss at your upcoming visit?  Patient reports she is needing a handicap placard.  Have you seen any other providers since your last visit with PCP? No   Chart review:  Recent office visits:  03/08/23 Meredith Staggers, MD - Family Medicine - Right knee laceration - Labs were ordered. XR Tibia/Fibula ordered. Referral to Neurology placed. Follow up in 2 weeks.   02/24/23 Meredith Staggers MD - Family Medicine - Closed fracture of right humerus - Wound culture ordered. Referral placed for Home health. Follow up in 5 days.   02/12/23 Meredith Staggers MD - Family Medicine - Closed fracture of right humerus - XR Lumbar spine and bilateral ribs and chest ordered. Referral placed to ENT and Neurology. Follow up in 1 week.   01/06/23 Meredith Staggers, MD - Family Medicine - Acute cough - Covid/Flu/RSV tests ordered. Symptomatic care instructions given. Follow up if no improvement.   12/14/22 Meredith Staggers, MD - Family Medicine - Collapse - Labs ordered, Referral placed to Neurology. Follow up as scheduled.   10/28/22 Meredith Staggers, MD - Family Medicine - Cough - Labs were ordered. Azithromycin (ZITHROMAX) 250 MG tablet and Pravastatin (PRAVACHOL) 80 MG tablet prescribed. Follow up if no improvement.    Recent consult visits:  03/03/23 Dorthula Perfect Dohmeier MD - Neurology - No changes. Follow up as scheduled.   09/28/22 Sunit Tolia DO - Cardiology - CHF - Labs were ordered. EKG performed. Fenofibrate (TRICOR) 145 MG tablet and Levothyroxine (SYNTHROID) 88 MCG tablet prescribed.    Hospital visits: 02/02/26  -02/04/23 Medication Reconciliation was completed by comparing discharge summary, patient's EMR and Pharmacy list, and upon discussion with patient.  Admitted to the hospital on 02/02/26 due to Closed fracture of proximal end of right humerus. Discharge date was 02/04/23. Discharged from [facility].    New?Medications Started at [facility] Discharge:?? None noted.  Medication Changes at Hospital Discharge: None noted.  Medications Discontinued at Hospital Discharge: None noted.  Medications that remain the same after Hospital Discharge:??  All other medications will remain the same.     Star Rating Drugs:  Medication:    Last Fill: Day Supply  Sacubitril-valsartan 97-103 MG 01/14/22 90  Pravastatin 80 MG tablet   08/31/22 90 Farxiga ??? (last filled 03/25/21)  Care Gaps: Annual wellness visit in last year? Yes done 06/17/22  If Diabetic: Last eye exam / retinopathy screening: due 10/20/23 Last diabetic foot exam: due 10/29/23   Future Appointments  Date Time Provider Department Center  03/24/2023 11:00 AM Erroll Luna, [facility] None  03/24/2023  3:40 PM Shade Flood, MD [facility]  03/25/2023  1:00 PM Antony Madura, MD [facility] None  04/23/2023  2:30 PM Tessa Lerner, DO [facility] None    [employer], [address]

## 2023-03-23 NOTE — Progress Notes (Unsigned)
Initial neurology clinic note  SERVICE DATE: 03/25/23  Reason for Evaluation: Consultation requested by Shade Flood, MD for an opinion regarding truncal weakness and speech difficulty. My final recommendations will be communicated back to the requesting physician by way of shared medical record or letter to requesting physician via [place] mail.  HPI: This is [name], a 67 y.o. ***-handed female with a medical history of DM, HLD, hypothyroidism, HTN, essential tremor, depression, anxiety*** who presents to neurology clinic with the chief complaint of ***. The patient is accompanied by ***.  ***  Truncal weakness and speech changes (word finding difficulties)  Patient previously was seen by [facility] (most recently on 03/03/23 by Dr. Vickey Huger) for essential tremor (on topiramate) and speech changes. The impression at that time was that symptoms may be side effect of topiramate vs depression related.  Patient is also on Cymbalta for ***.  The patient has not*** had similar episodes of symptoms in the past. ***  Muscle bulk loss? *** Muscle pain? ***  Cramps/Twitching? *** Suggestion of myotonia/difficulty relaxing after contraction? ***  Fatigable weakness?*** Does strength improve after brief exercise?***  Able to brush hair/teeth without difficulty? *** Able to button shirts/use zips? *** Clumsiness/dropping grasped objects?*** Can you arise from squatted position easily? *** Able to get out of chair without using arms? *** Able to walk up steps easily? *** Use an assistive device to walk? *** Significant imbalance with walking? *** Falls?*** Any change in urine color, especially after exertion/physical activity? ***  The patient denies*** symptoms suggestive of oculobulbar weakness including diplopia, ptosis, dysphagia, poor saliva control, dysarthria/dysphonia, impaired mastication, facial weakness/droop.  There are no*** neuromuscular respiratory weakness symptoms,  particularly orthopnea>dyspnea.   Pseudobulbar affect is absent***.  The patient does not*** report symptoms referable to autonomic dysfunction including impaired sweating, heat or cold intolerance, excessive mucosal dryness, gastroparetic early satiety, postprandial abdominal bloating, constipation, bowel or bladder dyscontrol, erectile dysfunction*** or syncope/presyncope/orthostatic intolerance.  There are no*** complaints relating to other symptoms of small fiber modalities including paresthesia/pain.  The patient has not *** noticed any recent skin rashes nor does he*** report any constitutional symptoms like fever, night sweats, anorexia or unintentional weight loss.  EtOH use: ***  Restrictive diet? *** Family history of neuropathy/myopathy/NM disease?***  Previous labs, electrodiagnostics, and neuroimaging are summarized below, but pertinent findings include***  Any biopsy done? *** Current medications being tried for the patient's symptoms include ***  Prior medications that have been tried: ***   MEDICATIONS:  Outpatient Encounter Medications as of 03/25/2023  Medication Sig   ACCU-CHEK AVIVA PLUS test strip daily. for testing   albuterol (VENTOLIN HFA) 108 (90 Base) MCG/ACT inhaler Inhale 1 puff into the lungs every 6 (six) hours as needed for wheezing or shortness of breath.   ALPRAZolam (XANAX) 1 MG tablet Take 0.5 mg by mouth 3 (three) times daily as needed for anxiety or sleep.  (Patient not taking: Reported on 03/08/2023)   aspirin 81 MG tablet Take 81 mg by mouth daily.   Azelastine HCl 137 MCG/SPRAY SOLN PLACE 1 SPRAY INTO BOTH NOSTRILS 2 (TWO) TIMES DAILY. USE IN EACH NOSTRIL AS DIRECTED IF RUNNY NOSE/DRAINAGE.   BLACK ELDERBERRY PO Take 10 mLs by mouth daily.   cephALEXin (KEFLEX) 500 MG capsule Take 1 capsule (500 mg total) by mouth 2 (two) times daily.   cetirizine (ZYRTEC) 10 MG tablet Take 10 mg by mouth daily.   Continuous Blood Gluc Receiver (DEXCOM G6 RECEIVER)  DEVI 1  Device by Does not apply route every morning. Device and supplies   dapagliflozin propanediol (FARXIGA) 10 MG TABS tablet Take 1 tablet (10 mg total) by mouth daily.   doxycycline (VIBRA-TABS) 100 MG tablet Take 1 tablet (100 mg total) by mouth 2 (two) times daily.   Dulaglutide (TRULICITY) 0.75 MG/0.5ML SOPN Inject 0.75 mg into the skin once a week.   DULoxetine (CYMBALTA) 60 MG capsule TAKE 2 CAPSULES BY MOUTH DAILY   ezetimibe (ZETIA) 10 MG tablet TAKE 1 TABLET BY MOUTH EVERY DAY   famotidine (PEPCID) 20 MG tablet Take 20 mg by mouth daily as needed for heartburn or indigestion.   fenofibrate (TRICOR) 145 MG tablet Take 1 tablet (145 mg total) by mouth daily.   fluticasone (FLONASE) 50 MCG/ACT nasal spray Place 2 sprays into both nostrils daily.   Fluticasone Furoate (ARNUITY ELLIPTA) 100 MCG/ACT AEPB Inhale 1 Inhalation into the lungs daily.   HYDROcodone-acetaminophen (NORCO/VICODIN) 5-325 MG tablet Take 1 tablet by mouth every 6 (six) hours as needed.   insulin glargine (LANTUS SOLOSTAR) 100 UNIT/ML Solostar Pen Inject 54 Units into the skin at bedtime.   Insulin Pen Needle 31G X 5 MM MISC Inject 1 each into the skin daily.   levothyroxine (SYNTHROID) 50 MCG tablet TAKE 1 TABLET BY MOUTH EVERY DAY   magnesium oxide (MAG-OX) 400 MG tablet TAKE 0.5 TABLETS (200 MG TOTAL) BY MOUTH 2 TIMES DAILY.   metoprolol succinate (TOPROL-XL) 100 MG 24 hr tablet TAKE 1 TABLET BY MOUTH EVERY DAY WITH OR IMMEDIATELY FOLLOWING A MEAL   nirmatrelvir & ritonavir (PAXLOVID, 150/100,) 10 x 150 MG & 10 x  TBPK Take 150 mg nirmatrelvir and 100 mg ritonavir PO BID for 5 days   nitroGLYCERIN (NITROSTAT) 0.4 MG SL tablet PLACE 1 TABLET UNDER THE TONGUE EVERY 5 MINUTES AS NEEDED FOR CHEST PAIN. IF YOU REQUIRE MORE THAN TWO TABLETS FIVE MINUTES APART GO TO THE NEAREST ER VIA EMS. (Patient taking differently: Place 0.4 mg under the tongue every 5 (five) minutes as needed for chest pain.)   omeprazole (PRILOSEC)  20 MG capsule TAKE 1 CAPSULE BY MOUTH EVERY DAY   ondansetron (ZOFRAN-ODT) 4 MG disintegrating tablet Take 1 tablet (4 mg total) by mouth every 8 (eight) hours as needed.   pravastatin (PRAVACHOL) 80 MG tablet Take 1 tablet (80 mg total) by mouth daily. TAKE 1 TABLET BY MOUTH EVERY DAY IN THE EVENING   predniSONE (DELTASONE) 20 MG tablet Take 2 tablets (40 mg total) by mouth daily with breakfast.   promethazine-dextromethorphan (PROMETHAZINE-DM) 6.25-15 MG/5ML syrup Take 5 mLs by mouth 4 (four) times daily as needed for cough.   sacubitril-valsartan (ENTRESTO) 97-103 MG Take 1 tablet by mouth 2 (two) times daily.   spironolactone (ALDACTONE) 25 MG tablet Take 1 tablet (25 mg total) by mouth daily.   topiramate (TOPAMAX) 50 MG tablet TAKE 1 TABLET BY MOUTH TWICE A DAY   Vitamin D, Ergocalciferol, (DRISDOL) 1.25 MG (50000 UNIT) CAPS capsule TAKE 1 CAPSULE (50,000 UNITS TOTAL) BY MOUTH EVERY 7 (SEVEN) DAYS   No facility-administered encounter medications on file as of 03/25/2023.    PAST MEDICAL HISTORY: Past Medical History:  Diagnosis Date   Allergy    Anxiety    CHF (congestive heart failure) 01/20/2021   COPD (chronic obstructive pulmonary disease)    Coronary artery calcification    Depression    Diabetes mellitus without complication    Diverticulitis    with colon resection   GERD (gastroesophageal reflux  disease)    on pepcid   Hyperlipidemia    Hypertension    Sleep apnea    cpap on order 01-01-22   Sleep paralysis    will be tested for narcolepsy per pt 01-01-22   Thyroid disease     PAST SURGICAL HISTORY: Past Surgical History:  Procedure Laterality Date   BREAST EXCISIONAL BIOPSY Left    CESAREAN SECTION     x2   CHOLECYSTECTOMY     COLON SURGERY     diverticulitis with perforation; s/p colon resection.   Colonoscopy     COLONOSCOPY     DENTAL SURGERY     RIGHT/LEFT HEART CATH AND CORONARY ANGIOGRAPHY N/A 01/22/2021   Procedure: RIGHT/LEFT HEART CATH AND CORONARY  ANGIOGRAPHY;  Surgeon: Yates Decamp, MD;  Location: [facility];  Service: Cardiovascular;  Laterality: N/A;   TUBAL LIGATION      ALLERGIES: Allergies  Allergen Reactions   Metformin And Related Other (See Comments)    Could not swallow   Nicotine Rash    Only allergic to nicotine patch   Ace Inhibitors Cough    Cough with lisinopril.     FAMILY HISTORY: Family History  Problem Relation Age of Onset   Colon polyps Mother    Cancer Mother        unknown primary   Hyperlipidemia Mother    Other Father    Breast cancer Paternal Aunt    Esophageal cancer Paternal Uncle    Colon cancer Neg Hx    Rectal cancer Neg Hx    Stomach cancer Neg Hx     SOCIAL HISTORY: Social History   Tobacco Use   Smoking status: Every Day    Packs/day: 0.25    Years: 38.00    Additional pack years: 0.00    Total pack years: 9.50    Types: Cigarettes   Smokeless tobacco: Never   Tobacco comments:    2-5 A DAY  Vaping Use   Vaping Use: Never used  Substance Use Topics   Alcohol use: No    Alcohol/week: 0.0 standard drinks of alcohol   Drug use: No   Social History   Social History Narrative   Marital status: married      Children:  2 children; 4 grandchildren      Lives: with husband, 2 granddaughters      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   Left-handed.   Caffeine use: 2.5 cups caffeine per day.     OBJECTIVE: PHYSICAL EXAM: There were no vitals taken for this visit.  General:*** General appearance: Awake and alert. No distress. Cooperative with exam.  Skin: No obvious rash or jaundice. HEENT: Atraumatic. Anicteric. Lungs: Non-labored breathing on room air  Heart: Regular Abdomen: Soft, non tender. Extremities: No edema. No obvious deformity.  Musculoskeletal: No obvious joint swelling. Psych: Affect appropriate.  Neurological: Mental Status: Alert. Speech fluent. No pseudobulbar  affect Cranial Nerves: CNII: No RAPD. Visual fields grossly intact. CNIII, IV, VI: PERRL. No nystagmus. EOMI. CN V: Facial sensation intact bilaterally to fine touch. [place] clench strong. Jaw jerk***. CN VII: Facial muscles symmetric and strong. No ptosis at rest or after sustained upgaze***. CN VIII: Hearing grossly intact bilaterally. CN IX: No hypophonia. CN X: Palate elevates symmetrically. CN XI: Full strength shoulder shrug bilaterally. CN XII: Tongue protrusion full and midline. No atrophy or fasciculations. No  significant dysarthria*** Motor: Tone is ***. *** fasciculations in *** extremities. *** atrophy. No grip or percussive myotonia.***  Individual muscle group testing (MRC grade out of 5):  Movement     Neck flexion ***    Neck extension ***     Right Left   Shoulder abduction *** ***   Shoulder adduction *** ***   Shoulder ext rotation *** ***   Shoulder int rotation *** ***   Elbow flexion *** ***   Elbow extension *** ***   Wrist extension *** ***   Wrist flexion *** ***   Finger abduction - FDI *** ***   Finger abduction - ADM *** ***   Finger extension *** ***   Finger distal flexion - 2/3 *** ***   Finger distal flexion - 4/5 *** ***   Thumb flexion - FPL *** ***   Thumb abduction - APB *** ***    Hip flexion *** ***   Hip extension *** ***   Hip adduction *** ***   Hip abduction *** ***   Knee extension *** ***   Knee flexion *** ***   Dorsiflexion *** ***   Plantarflexion *** ***   Inversion *** ***   Eversion *** ***   Great toe extension *** ***   Great toe flexion *** ***     Reflexes:  Right Left   Bicep *** ***   Tricep *** ***   BrRad *** ***   Knee *** ***   Ankle *** ***    Pathological Reflexes: Babinski: *** response bilaterally*** Hoffman: *** Troemner: *** Pectoral: *** Palmomental: *** Facial: *** Midline tap: *** Sensation: Pinprick: *** Vibration: *** Temperature: *** Proprioception: *** Coordination:  Intact finger-to- nose-finger bilaterally. Romberg negative.*** Gait: Able to rise from chair with arms crossed unassisted. Normal, narrow-based gait. Able to tandem walk. Able to walk on toes and heels.***  Lab and Test Review: Internal labs: CMP (02/03/23): remarkable for Cr of 1.59 (chronic), GFR 35 CBC (02/03/23): leukocytosis of 12.9 (chronic), otherwise unremarkable TSH (12/16/22): 0.03 HbA1c (11/13/22): 5.4 (as high as 13.1 on 09/11/21) ***  External labs: ***  Imaging: CT head, maxillofacial, cervical spine wo contrast (02/03/23): FINDINGS: CT HEAD FINDINGS   Brain: No evidence of acute infarction, hemorrhage, hydrocephalus, extra-axial collection or mass lesion/mass effect.   Mild subcortical white matter and periventricular small vessel ischemic changes. 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   Vascular: Mild intracranial atherosclerosis.   Skull: Normal. Negative for fracture or focal lesion.   Other: None.   CT MAXILLOFACIAL FINDINGS   Osseous: Bilateral nasal bone fractures (series 4/image 65), age indeterminate. Otherwise, no evidence of maxillofacial fracture. Mandible is intact. Bilateral mandibular condyles are well-seated in the TMJs.   Orbits: Bilateral orbits, including the globes and retroconal soft tissues, are well-seated in the TMJs.   Sinuses: The visualized paranasal sinuses are essentially clear. The mastoid air cells are unopacified.   Soft tissues: Negative.   CT CERVICAL SPINE FINDINGS   Alignment: Normal cervical lordosis.   Skull base and vertebrae: No acute fracture. No primary bone lesion or focal pathologic process.   Soft tissues and spinal canal: No prevertebral fluid or swelling. No visible canal hematoma.   Disc levels: Moderate degenerative changes of the mid cervical spine. Spinal canal is patent.   Upper chest: Visualized lung apices are clear.   Other: Visualized thyroid is unremarkable.   IMPRESSION: No evidence of  acute intracranial abnormality. Mild small vessel ischemic changes. Old left basal ganglia  acute infarct.   Bilateral nasal bone fractures, age indeterminate. Correlate for point tenderness. Otherwise, no evidence of maxillofacial fracture.   No traumatic injury to the cervical spine. Moderate degenerative changes.  ASSESSMENT: Felicia Hanson is a 67 y.o. female who presents for evaluation of ***. *** has a relevant medical history of ***. *** neurological examination is pertinent for ***. Available diagnostic data is significant for ***. This constellation of symptoms and objective data would most likely localize to ***. ***  PLAN: -Blood work: ***TSH ***  -Return to clinic ***  The impression above as well as the plan as outlined below were extensively discussed with the patient (in the company of ***) who voiced understanding. All questions were answered to their satisfaction.  The patient was counseled on pertinent fall precautions per the printed material provided today, and as noted under the "Patient Instructions" section below.***  When available, results of the above investigations and possible further recommendations will be communicated to the patient via telephone/MyChart. Patient to call office if not contacted after expected testing turnaround time.   Total time spent reviewing records, interview, history/exam, documentation, and coordination of care on day of encounter:  *** min   Thank you for allowing me to participate in patient's care.  If I can answer any additional questions, I would be pleased to do so.  Jacquelyne Balint, MD   CC: Shade Flood, MD [address] [place] [address]  CC: Referring provider: Shade Flood, MD [address] [place],  [place] [address]

## 2023-03-24 ENCOUNTER — Ambulatory Visit (INDEPENDENT_AMBULATORY_CARE_PROVIDER_SITE_OTHER): Payer: Medicare Other | Admitting: Family Medicine

## 2023-03-24 ENCOUNTER — Encounter: Payer: Medicare Other | Admitting: Pharmacist

## 2023-03-24 ENCOUNTER — Encounter: Payer: Self-pay | Admitting: Family Medicine

## 2023-03-24 VITALS — BP 122/74 | HR 82 | Temp 98.7°F | Ht 60.0 in | Wt 144.8 lb

## 2023-03-24 DIAGNOSIS — H539 Unspecified visual disturbance: Secondary | ICD-10-CM

## 2023-03-24 DIAGNOSIS — S022XXD Fracture of nasal bones, subsequent encounter for fracture with routine healing: Secondary | ICD-10-CM | POA: Diagnosis not present

## 2023-03-24 DIAGNOSIS — S81011D Laceration without foreign body, right knee, subsequent encounter: Secondary | ICD-10-CM

## 2023-03-24 DIAGNOSIS — S42201D Unspecified fracture of upper end of right humerus, subsequent encounter for fracture with routine healing: Secondary | ICD-10-CM | POA: Diagnosis not present

## 2023-03-24 DIAGNOSIS — M79661 Pain in right lower leg: Secondary | ICD-10-CM

## 2023-03-24 DIAGNOSIS — R202 Paresthesia of skin: Secondary | ICD-10-CM

## 2023-03-24 DIAGNOSIS — F411 Generalized anxiety disorder: Secondary | ICD-10-CM

## 2023-03-24 NOTE — Patient Instructions (Addendum)
For counseling here are a few options, but it may be best to check with your insurance first to see who is in network. Your psychiatrist may have other names.   Here are a few options for counseling:  [facility]:  [phone]  [facility] [phone]  Knee wound is healing. Ok to shower and soap and water to area. Left knee appears to be healing scab.   Discuss the rain sensation in the body with your psychiatrist and neurology appointment.  Discuss the vision with neurology and planned eye specialist appointment.   Have labs for diabetes and cholesterol prior to follow up with me in 1 month.  [facility] or xray: Walk in 8:30-4:30 during weekdays, no appointment needed [address]  [place], [place] [address]  Return to the clinic or go to the nearest emergency room if any of your symptoms worsen or new symptoms occur.

## 2023-03-24 NOTE — Progress Notes (Incomplete)
Care Management & Coordination Services Pharmacy Note  03/24/2023 Name:  Felicia Hanson MRN:  [id number] DOB:  07-03-1956  Summary: ***  Recommendations/Changes made from today's visit: ***  Follow up plan: ***   Subjective: Felicia Hanson is an 67 y.o. year old female who is a primary patient of Neva Seat, Asencion Partridge, MD.  The care coordination team was consulted for assistance with disease management and care coordination needs.    {CCMTELEPHONEFACETOFACE:21091510} for initial visit.  Recent office visits:  03/08/23 Meredith Staggers, MD - Family Medicine - Right knee laceration - Labs were ordered. XR Tibia/Fibula ordered. Referral to Neurology placed. Follow up in 2 weeks.    02/24/23 Meredith Staggers MD - Family Medicine - Closed fracture of right humerus - Wound culture ordered. Referral placed for Home health. Follow up in 5 days.    02/12/23 Meredith Staggers MD - Family Medicine - Closed fracture of right humerus - XR Lumbar spine and bilateral ribs and chest ordered. Referral placed to ENT and Neurology. Follow up in 1 week.    01/06/23 Meredith Staggers, MD - Family Medicine - Acute cough - Covid/Flu/RSV tests ordered. Symptomatic care instructions given. Follow up if no improvement.    12/14/22 Meredith Staggers, MD - Family Medicine - Collapse - Labs ordered, Referral placed to Neurology. Follow up as scheduled.    10/28/22 Meredith Staggers, MD - Family Medicine - Cough - Labs were ordered. Azithromycin (ZITHROMAX) 250 MG tablet and Pravastatin (PRAVACHOL) 80 MG tablet prescribed. Follow up if no improvement.     Recent consult visits:  03/03/23 Dorthula Perfect Dohmeier MD - Neurology - No changes. Follow up as scheduled.    09/28/22 Sunit Tolia DO - Cardiology - CHF - Labs were ordered. EKG performed. Fenofibrate (TRICOR) 145 MG tablet and Levothyroxine (SYNTHROID) 88 MCG tablet prescribed.      Hospital visits: 02/02/26 -02/04/23 Medication Reconciliation was completed by comparing discharge summary,  patient's EMR and Pharmacy list, and upon discussion with patient.   Admitted to the hospital on 02/02/26 due to Closed fracture of proximal end of right humerus. Discharge date was 02/04/23. Discharged from [facility].     New?Medications Started at [facility] Discharge:?? None noted.   Medication Changes at Hospital Discharge: None noted.   Medications Discontinued at Hospital Discharge: None noted.   Medications that remain the same after Hospital Discharge:??  All other medications will remain the same.     Objective:  Lab Results  Component Value Date   CREATININE 1.59 (H) 02/03/2023   BUN 20 02/03/2023   GFR 37.82 (L) 12/16/2022   EGFR 37 (L) 02/21/2021   GFRNONAA 35 (L) 02/03/2023   GFRAA 78 12/06/2020   NA 139 02/03/2023   K 3.9 02/03/2023   CALCIUM 9.2 02/03/2023   CO2 19 (L) 02/03/2023   GLUCOSE 76 02/03/2023    Lab Results  Component Value Date/Time   HGBA1C 5.4 11/13/2022 03:07 PM   HGBA1C 5.9 08/05/2022 02:04 PM   GFR 37.82 (L) 12/16/2022 03:14 PM   GFR 35.73 (L) 11/13/2022 03:07 PM   MICROALBUR 1.8 11/13/2022 03:07 PM   MICROALBUR 19.2 (H) 05/21/2021 01:34 PM    Last diabetic Eye exam:  Lab Results  Component Value Date/Time   HMDIABEYEEXA No Retinopathy 10/19/2022 12:00 AM   HMDIABEYEEXA No Retinopathy 10/19/2022 12:00 AM    Last diabetic Foot exam: No results found for: "HMDIABFOOTEX"   Lab Results  Component Value Date   CHOL 132 08/05/2022   HDL 37.60 (L) 08/05/2022  LDLCALC 54 12/03/2021   LDLDIRECT 69.0 08/05/2022   TRIG 228.0 (H) 08/05/2022   CHOLHDL 4 08/05/2022       Latest Ref Rng & Units 02/03/2023    8:49 PM 08/05/2022    2:04 PM 12/03/2021   12:37 PM  Hepatic Function  Total Protein 6.5 - 8.1 g/dL 7.3  7.3  7.2   Albumin 3.5 - 5.0 g/dL 3.6  3.7  3.8   AST 15 - 41 U/L 26  18  16    ALT 0 - 44 U/L 14  11  9    Alk Phosphatase 38 - 126 U/L 69  68  75   Total Bilirubin 0.3 - 1.2 mg/dL 0.8  0.4  0.5     Lab Results  Component  Value Date/Time   TSH 0.03 (L) 12/16/2022 03:14 PM   TSH 0.01 (L) 11/13/2022 03:07 PM   FREET4 0.7 (L) 12/15/2021 12:04 PM   FREET4 1.33 03/17/2017 05:46 PM       Latest Ref Rng & Units 02/03/2023    8:49 PM 02/03/2023    8:48 PM 12/16/2022    3:14 PM  CBC  WBC 4.0 - 10.5 K/uL 12.9   10.5   Hemoglobin 12.0 - 15.0 g/dL 16.1  09.6  04.5   Hematocrit 36.0 - 46.0 % 42.4  43.0  43.4   Platelets 150 - 400 K/uL 205   237.0     Lab Results  Component Value Date/Time   VD25OH 44.3 09/06/2020 11:52 AM   VD25OH 10.9 (L) 04/03/2020 05:57 PM    Clinical ASCVD: {YES/NO:21197} The 10-year ASCVD risk score (Arnett DK, et al., 2019) is: 26.7%   Values used to calculate the score:     Age: 56 years     Sex: Female     Is Non-Hispanic African American: No     Diabetic: Yes     Tobacco smoker: Yes     Systolic Blood Pressure: 122 mmHg     Is BP treated: Yes     HDL Cholesterol: 37.6 mg/dL     Total Cholesterol: 132 mg/dL    ***Other: ([id number] if Afib, MMRC or CAT for COPD, ACT, DEXA)     02/12/2023   11:37 AM 01/06/2023    1:56 PM 10/28/2022    2:18 PM  Depression screen PHQ 2/9  Decreased Interest 1 1 1   Down, Depressed, Hopeless 1 1 0  PHQ - 2 Score 2 2 1   Altered sleeping 1 1 1   Tired, decreased energy 0 0 0  Change in appetite 0 0 0  Feeling bad or failure about yourself  0 0 0  Trouble concentrating 0 0 0  Moving slowly or fidgety/restless 0 0 0  Suicidal thoughts 0 0 0  PHQ-9 Score 3 3 2      Social History   Tobacco Use  Smoking Status Every Day   Packs/day: 0.25   Years: 38.00   Additional pack years: 0.00   Total pack years: 9.50   Types: Cigarettes  Smokeless Tobacco Never  Tobacco Comments   2-5 A DAY   BP Readings from Last 3 Encounters:  03/08/23 122/68  03/03/23 122/63  02/24/23 122/88   Pulse Readings from Last 3 Encounters:  03/08/23 86  03/03/23 81  02/24/23 85   Wt Readings from Last 3 Encounters:  03/08/23 143 lb 3.2 oz (65 kg)  03/03/23  143 lb (64.9 kg)  02/24/23 144 lb 3.2 oz (65.4 kg)   BMI Readings from  Last 3 Encounters:  03/08/23 27.97 kg/m  03/03/23 27.93 kg/m  02/24/23 28.16 kg/m    Allergies  Allergen Reactions   Metformin And Related Other (See Comments)    Could not swallow   Nicotine Rash    Only allergic to nicotine patch   Ace Inhibitors Cough    Cough with lisinopril.     Medications Reviewed Today     Reviewed by Shade Flood, MD (Physician) on 03/09/23 at 1156  Med List Status: <None>   Medication Order Taking? Sig Documenting Provider Last Dose Status Informant  ACCU-CHEK AVIVA PLUS test strip [id number] Yes daily. for testing [provider] Taking Active Spouse/Significant Other  albuterol (VENTOLIN HFA) 108 (90 Base) MCG/ACT inhaler [id number] Yes Inhale 1 puff into the lungs every 6 (six) hours as needed for wheezing or shortness of breath. [provider] Taking Active   ALPRAZolam Prudy Feeler) 1 MG tablet [id number] No Take 0.5 mg by mouth 3 (three) times daily as needed for anxiety or sleep.   Patient not taking: Reported on 03/08/2023   [provider] Not Taking Active Spouse/Significant Other           Med Note Cory Roughen, Doristine Johns Mar 24, 2021 10:53 AM)    aspirin 81 MG tablet [id number] Yes Take 81 mg by mouth daily. [provider] Taking Active Spouse/Significant Other  Azelastine HCl 137 MCG/SPRAY SOLN [id number] Yes PLACE 1 SPRAY INTO BOTH NOSTRILS 2 (TWO) TIMES DAILY. USE IN EACH NOSTRIL AS DIRECTED IF RUNNY NOSE/DRAINAGE. Shade Flood, MD Taking Active   BLACK ELDERBERRY PO [id number] Yes Take 10 mLs by mouth daily. [provider] Taking Active   cephALEXin (KEFLEX) 500 MG capsule [id number] Yes Take 1 capsule (500 mg total) by mouth 2 (two) times daily. Shade Flood, MD Taking Active   cetirizine (ZYRTEC) 10 MG tablet [id number] Yes Take 10 mg by mouth daily. [provider] Taking Active   Continuous Blood Gluc  Receiver (DEXCOM G6 RECEIVER) DEVI [id number] Yes 1 Device by Does not apply route every morning. Device and supplies Shade Flood, MD Taking Active Spouse/Significant Other  dapagliflozin propanediol (FARXIGA) 10 MG TABS tablet [id number] Yes Take 1 tablet (10 mg total) by mouth daily. Tessa Lerner, DO Taking Active   doxycycline (VIBRA-TABS) 100 MG tablet [id number] Yes Take 1 tablet (100 mg total) by mouth 2 (two) times daily. Shade Flood, MD Taking Active   Dulaglutide (TRULICITY) 0.75 MG/0.5ML Namon Cirri [id number] Yes Inject 0.75 mg into the skin once a week. Shade Flood, MD Taking Active   DULoxetine (CYMBALTA) 60 MG capsule [id number] Yes TAKE 2 CAPSULES BY MOUTH DAILY Shade Flood, MD Taking Active   ezetimibe (ZETIA) 10 MG tablet [id number] Yes TAKE 1 TABLET BY MOUTH EVERY DAY Shade Flood, MD Taking Active   famotidine (PEPCID) 20 MG tablet [id number] Yes Take 20 mg by mouth daily as needed for heartburn or indigestion. [provider] Taking Active   fenofibrate (TRICOR) 145 MG tablet [id number]  Take 1 tablet (145 mg total) by mouth daily. Tolia, Sunit, DO  Expired 12/27/22 2359   fluticasone (FLONASE) 50 MCG/ACT nasal spray [id number] Yes Place 2 sprays into both nostrils daily. Leath-Warren, Sadie Haber, NP Taking Active   Fluticasone Furoate (ARNUITY ELLIPTA) 100 MCG/ACT AEPB [id number] Yes Inhale 1 Inhalation into the lungs daily. Shade Flood, MD Taking Active   HYDROcodone-acetaminophen (NORCO/VICODIN) 5-325 MG tablet [id number] Yes Take 1 tablet by mouth every 6 (  six) hours as needed. Shade Flood, MD Taking Active   insulin glargine (LANTUS SOLOSTAR) 100 UNIT/ML Solostar Pen [id number] Yes Inject 54 Units into the skin at bedtime. Shade Flood, MD Taking Active   Insulin Pen Needle 31G X 5 MM MISC [id number] Yes Inject 1 each into the skin daily. Shade Flood, MD Taking Active   levothyroxine (SYNTHROID) 50 MCG tablet [id number] Yes TAKE 1 TABLET  BY MOUTH EVERY DAY Shade Flood, MD Taking Active   magnesium oxide (MAG-OX) 400 MG tablet [id number] Yes TAKE 0.5 TABLETS (200 MG TOTAL) BY MOUTH 2 TIMES DAILY. Odis Hollingshead, Sunit, DO Taking Active   metoprolol succinate (TOPROL-XL) 100 MG 24 hr tablet [id number] Yes TAKE 1 TABLET BY MOUTH EVERY DAY WITH OR IMMEDIATELY FOLLOWING A MEAL Tolia, Sunit, DO Taking Active   nirmatrelvir & ritonavir (PAXLOVID, 150/100,) 10 x 150 MG & 10 x 100MG  TBPK [id number] Yes Take 150 mg nirmatrelvir and 100 mg ritonavir PO BID for 5 days Tomi Bamberger, PA-C Taking Active   nitroGLYCERIN (NITROSTAT) 0.4 MG SL tablet [id number] Yes PLACE 1 TABLET UNDER THE TONGUE EVERY 5 MINUTES AS NEEDED FOR CHEST PAIN. IF YOU REQUIRE MORE THAN TWO TABLETS FIVE MINUTES APART GO TO THE NEAREST ER VIA EMS.  Patient taking differently: Place 0.4 mg under the tongue every 5 (five) minutes as needed for chest pain.   Tessa Lerner, DO Taking Active   omeprazole (PRILOSEC) 20 MG capsule [id number] Yes TAKE 1 CAPSULE BY MOUTH EVERY DAY Shade Flood, MD Taking Active   ondansetron (ZOFRAN-ODT) 4 MG disintegrating tablet [id number] Yes Take 1 tablet (4 mg total) by mouth every 8 (eight) hours as needed. Tomi Bamberger, PA-C Taking Active   pravastatin (PRAVACHOL) 80 MG tablet [id number] Yes Take 1 tablet (80 mg total) by mouth daily. TAKE 1 TABLET BY MOUTH EVERY DAY IN THE Oletha Cruel, MD Taking Active   predniSONE (DELTASONE) 20 MG tablet [id number] Yes Take 2 tablets (40 mg total) by mouth daily with breakfast. Shade Flood, MD Taking Active   promethazine-dextromethorphan (PROMETHAZINE-DM) 6.25-15 MG/5ML syrup [id number] Yes Take 5 mLs by mouth 4 (four) times daily as needed for cough. Leath-Warren, Sadie Haber, NP Taking Active   sacubitril-valsartan (ENTRESTO) 97-103 MG [id number] Yes Take 1 tablet by mouth 2 (two) times daily. Tessa Lerner, DO Taking Active   spironolactone (ALDACTONE) 25 MG tablet [id number] Yes Take 1  tablet (25 mg total) by mouth daily. Shade Flood, MD Taking Active   topiramate (TOPAMAX) 50 MG tablet [id number] Yes TAKE 1 TABLET BY MOUTH TWICE A DAY Shade Flood, MD Taking Active   Vitamin D, Ergocalciferol, (DRISDOL) 1.25 MG (50000 UNIT) CAPS capsule [id number] Yes TAKE 1 CAPSULE (50,000 UNITS TOTAL) BY MOUTH EVERY 7 (SEVEN) DAYS Shade Flood, MD Taking Active             SDOH:  (Social Determinants of Health) assessments and interventions performed: {yes/no:20286} SDOH Interventions    Flowsheet Row Telephone from 02/08/2023 in [facility] Community Care Coordination Office Visit from 08/05/2022 in [facility] Clinical Support from 06/17/2022 in [facility] Visit from 03/18/2022 in [facility] Visit from 12/03/2021 in [facility] ED to Hosp-Admission (Discharged) from 01/20/2021 in [facility]  SDOH Interventions        Food Insecurity Interventions Intervention  Not Indicated -- Intervention Not Indicated -- -- Intervention Not Indicated  Housing Interventions -- -- Intervention Not Indicated -- -- Intervention Not Indicated  Transportation Interventions Intervention Not Indicated -- Intervention Not Indicated -- -- Intervention Not Indicated  Depression Interventions/Treatment  -- Currently on Treatment -- Currently on Treatment Currently on Treatment --  Financial Strain Interventions -- -- Intervention Not Indicated -- -- Intervention Not Indicated  Physical Activity Interventions -- -- Intervention Not Indicated -- -- --  Stress Interventions -- -- Intervention Not Indicated -- -- --  Social Connections Interventions -- -- Intervention Not Indicated -- -- --       Medication Assistance: {MEDASSISTANCEINFO:25044}  Medication Access: Within the past 30 days, how  often has patient missed a dose of medication? *** Is a pillbox or other method used to improve adherence? {YES/NO:21197} Factors that may affect medication adherence? {CHL DESC; BARRIERS:21522} Are meds synced by current pharmacy? {YES/NO:21197} Are meds delivered by current pharmacy? {YES/NO:21197} Does patient experience delays in picking up medications due to transportation concerns? {YES/NO:21197}  [facility] Reviewed: Is patient disadvantaged to use [facility]?: {YES/NO:21197} Current Rx insurance plan: *** Name and location of Current pharmacy:  [phone] [place], [place] - [address] [place] [address] Phone: [phone] Fax: [phone]  [facility] - [place], [place] - [address] [facility] AT Portal to Registered Caremark Sites [address] [place] [address] Phone: [phone] Fax: [phone]  [facility] - [place], [place] - [address] [place] [address] Phone: [phone] Fax: [phone]  [facility] [place], [place] - [place] [address] [place] [address] Phone: [phone] Fax: [phone]  [facility] [place], [place] - [address] [place] [address] Phone: [phone] Fax: [phone]  [facility] - [place], [place] - [address] [place] [address] Phone: [phone] Fax: [phone]  [facility] services reviewed with patient today?: {YES/NO:21197} Patient requests to transfer care to [facility]?: {YES/NO:21197} Reason patient declined to change pharmacies: {US patient preference:27474}  Compliance/Adherence/Medication fill history: Sacubitril-valsartan 97-103 MG          01/14/22            90         Pravastatin 80 MG tablet                    08/31/22            90 Farxiga  ??? (last filled 03/25/21)   Care Gaps: Annual wellness visit in last year? Yes done 06/17/22   If Diabetic: Last eye exam / retinopathy screening: due 10/20/23 Last diabetic foot exam: due 10/29/23   Assessment/Plan   Hypertension (BP goal {CHL HP UPSTREAM Pharmacist BP ranges:[phone]}) -{US controlled/uncontrolled:25276} -Current treatment: *** -Medications previously tried: ***  -Current home readings: *** -Current dietary habits: *** -Current exercise habits: *** -{ACTIONS;DENIES/REPORTS:21021675::"Denies"} hypotensive/hypertensive symptoms -Educated on {CCM BP Counseling:25124} -Counseled to monitor BP at home ***, document, and provide log at future appointments -{CCMPHARMDINTERVENTION:25122}  Hyperlipidemia: (LDL goal < ***) -{US controlled/uncontrolled:25276} -Current treatment: *** -Medications previously tried: ***  -Current dietary patterns: *** -Current exercise habits: *** -Educated on {CCM HLD Counseling:25126} -{CCMPHARMDINTERVENTION:25122}  Diabetes (A1c goal {A1c goals:23924}) -{US controlled/uncontrolled:25276} -Current medications: *** -Medications previously tried: ***  -Current home  glucose readings fasting glucose: *** post prandial glucose: *** -{ACTIONS;DENIES/REPORTS:21021675::"Denies"} hypoglycemic/hyperglycemic symptoms -Current meal patterns:  breakfast: ***  lunch: ***  dinner: *** snacks: *** drinks: *** -Current exercise: *** -Educated on {CCM DM COUNSELING:25123} -Counseled to check feet daily and get yearly eye exams -{CCMPHARMDINTERVENTION:25122}  Heart Failure (Goal: manage symptoms and prevent exacerbations) -{US controlled/uncontrolled:25276} -Last ejection fraction: *** (Date: ***) -HF type: {type of heart failure:28376} -NYHA Class: {CHL HP [facility] NYHA [phone]} -AHA HF Stage: {CHL HP [facility] AHA HF Stage:[phone]} -Current treatment: *** -Medications previously tried: ***  -Current home  BP/HR readings: *** -Current home daily weights: *** -Current dietary habits: *** -Current exercise habits: *** -Educated on {CCM HF Counseling:25125} -{CCMPHARMDINTERVENTION:25122}  Depression/Anxiety (Goal: ***) -{US controlled/uncontrolled:25276} -Current treatment: *** -Medications previously tried/failed: *** -PHQ9: *** -GAD7: *** -Connected with *** for mental health support -Educated on {CCM mental health counseling:25127} -{CCMPHARMDINTERVENTION:25122}  ***
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
## 2023-03-25 ENCOUNTER — Other Ambulatory Visit (INDEPENDENT_AMBULATORY_CARE_PROVIDER_SITE_OTHER): Payer: Medicare Other

## 2023-03-25 ENCOUNTER — Encounter: Payer: Self-pay | Admitting: Neurology

## 2023-03-25 ENCOUNTER — Ambulatory Visit: Payer: Medicare Other | Admitting: Neurology

## 2023-03-25 ENCOUNTER — Ambulatory Visit: Payer: Medicare Other | Admitting: Pharmacist

## 2023-03-25 VITALS — BP 147/66 | HR 84 | Ht 60.0 in | Wt 146.0 lb

## 2023-03-25 DIAGNOSIS — G4733 Obstructive sleep apnea (adult) (pediatric): Secondary | ICD-10-CM

## 2023-03-25 DIAGNOSIS — F341 Dysthymic disorder: Secondary | ICD-10-CM

## 2023-03-25 DIAGNOSIS — F0781 Postconcussional syndrome: Secondary | ICD-10-CM

## 2023-03-25 DIAGNOSIS — R946 Abnormal results of thyroid function studies: Secondary | ICD-10-CM | POA: Diagnosis not present

## 2023-03-25 DIAGNOSIS — G250 Essential tremor: Secondary | ICD-10-CM

## 2023-03-25 DIAGNOSIS — D519 Vitamin B12 deficiency anemia, unspecified: Secondary | ICD-10-CM | POA: Diagnosis not present

## 2023-03-25 DIAGNOSIS — E559 Vitamin D deficiency, unspecified: Secondary | ICD-10-CM | POA: Diagnosis not present

## 2023-03-25 DIAGNOSIS — R410 Disorientation, unspecified: Secondary | ICD-10-CM

## 2023-03-25 DIAGNOSIS — H35033 Hypertensive retinopathy, bilateral: Secondary | ICD-10-CM | POA: Diagnosis not present

## 2023-03-25 DIAGNOSIS — H43392 Other vitreous opacities, left eye: Secondary | ICD-10-CM | POA: Diagnosis not present

## 2023-03-25 DIAGNOSIS — E119 Type 2 diabetes mellitus without complications: Secondary | ICD-10-CM

## 2023-03-25 DIAGNOSIS — H0102A Squamous blepharitis right eye, upper and lower eyelids: Secondary | ICD-10-CM | POA: Diagnosis not present

## 2023-03-25 NOTE — Addendum Note (Signed)
Addended by: Lenise Herald on: 03/25/2023 03:13 PM   Modules accepted: Orders
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
## 2023-03-25 NOTE — Patient Instructions (Addendum)
I would like to do the following to help you with your symptoms: -Blood work today -MRI brain wo contrast -Referral to psychiatry -Taper off topamax:  -Reduce to 50 mg once daily for 2 weeks  -Then stop -May consider other treatment of essential tremor if needed -Discussed OSA and need for CPAP, we will continue to discuss this in the future  I will be in touch when I have your results. I will see you back in clinic in 3 months.  The physicians and staff at [facility] are committed to providing excellent care. You may receive a survey requesting feedback about your experience at our office. We strive to receive "very good" responses to the survey questions. If you feel that your experience would prevent you from giving the office a "very good " response, please contact our office to try to remedy the situation. We may be reached at [phone]. Thank you for taking the time out of your busy day to complete the survey.  Jacquelyne Balint, MD [facility]

## 2023-03-26 ENCOUNTER — Telehealth: Payer: Self-pay

## 2023-03-26 NOTE — Telephone Encounter (Signed)
   Telephone encounter was:  Successful.  03/26/2023 Name: MEILA BERKE MRN: [id number] DOB: 04-09-1956  [name] is a 67 y.o. year old female who is a primary care patient of Neva Seat, Asencion Partridge, MD . The community resource team was consulted for assistance with Transportation Needs   Care guide performed the following interventions: Spoke with patient verified home address to mail [employer] transportation application. Letter saved in Epic.  Follow Up Plan:  No further follow up planned at this time. The patient has been provided with needed resources.  Savera Donson [facility]  [facility] Population Health Community Resource Care Guide   [email or web address]  ?? [phone]   Website: [email or web address]  [email or web address]

## 2023-03-27 LAB — VITAMIN B12: Vitamin B-12: 308 pg/mL (ref 232–1245)

## 2023-03-27 LAB — VITAMIN D 25 HYDROXY (VIT D DEFICIENCY, FRACTURES): Vit D, 25-Hydroxy: 78.3 ng/mL (ref 30.0–100.0)

## 2023-03-27 LAB — TSH: TSH: 1.31 u[IU]/mL (ref 0.450–4.500)

## 2023-03-29 ENCOUNTER — Telehealth: Payer: Self-pay

## 2023-03-29 NOTE — Telephone Encounter (Signed)
Paper work is placed in front

## 2023-03-30 ENCOUNTER — Ambulatory Visit: Payer: Medicare Other | Admitting: Cardiology

## 2023-04-02 ENCOUNTER — Other Ambulatory Visit: Payer: Self-pay | Admitting: Family Medicine

## 2023-04-02 DIAGNOSIS — R7989 Other specified abnormal findings of blood chemistry: Secondary | ICD-10-CM

## 2023-04-02 DIAGNOSIS — Z794 Long term (current) use of insulin: Secondary | ICD-10-CM

## 2023-04-04 ENCOUNTER — Other Ambulatory Visit: Payer: Self-pay | Admitting: Family Medicine

## 2023-04-04 DIAGNOSIS — E1129 Type 2 diabetes mellitus with other diabetic kidney complication: Secondary | ICD-10-CM

## 2023-04-05 ENCOUNTER — Telehealth: Payer: Self-pay

## 2023-04-05 DIAGNOSIS — Z7982 Long term (current) use of aspirin: Secondary | ICD-10-CM | POA: Diagnosis not present

## 2023-04-05 DIAGNOSIS — S2232XA Fracture of one rib, left side, initial encounter for closed fracture: Secondary | ICD-10-CM | POA: Diagnosis not present

## 2023-04-05 DIAGNOSIS — G250 Essential tremor: Secondary | ICD-10-CM | POA: Diagnosis not present

## 2023-04-05 DIAGNOSIS — Z794 Long term (current) use of insulin: Secondary | ICD-10-CM | POA: Diagnosis not present

## 2023-04-05 DIAGNOSIS — E039 Hypothyroidism, unspecified: Secondary | ICD-10-CM | POA: Diagnosis not present

## 2023-04-05 DIAGNOSIS — G4730 Sleep apnea, unspecified: Secondary | ICD-10-CM | POA: Diagnosis not present

## 2023-04-05 DIAGNOSIS — F32A Depression, unspecified: Secondary | ICD-10-CM | POA: Diagnosis not present

## 2023-04-05 DIAGNOSIS — Z9181 History of falling: Secondary | ICD-10-CM | POA: Diagnosis not present

## 2023-04-05 DIAGNOSIS — J431 Panlobular emphysema: Secondary | ICD-10-CM | POA: Diagnosis not present

## 2023-04-05 DIAGNOSIS — S022XXD Fracture of nasal bones, subsequent encounter for fracture with routine healing: Secondary | ICD-10-CM | POA: Diagnosis not present

## 2023-04-05 DIAGNOSIS — S81011D Laceration without foreign body, right knee, subsequent encounter: Secondary | ICD-10-CM | POA: Diagnosis not present

## 2023-04-05 DIAGNOSIS — M545 Low back pain, unspecified: Secondary | ICD-10-CM | POA: Diagnosis not present

## 2023-04-05 DIAGNOSIS — E7800 Pure hypercholesterolemia, unspecified: Secondary | ICD-10-CM | POA: Diagnosis not present

## 2023-04-05 DIAGNOSIS — E119 Type 2 diabetes mellitus without complications: Secondary | ICD-10-CM | POA: Diagnosis not present

## 2023-04-05 DIAGNOSIS — S42301D Unspecified fracture of shaft of humerus, right arm, subsequent encounter for fracture with routine healing: Secondary | ICD-10-CM | POA: Diagnosis not present

## 2023-04-05 DIAGNOSIS — I5042 Chronic combined systolic (congestive) and diastolic (congestive) heart failure: Secondary | ICD-10-CM | POA: Diagnosis not present

## 2023-04-05 DIAGNOSIS — K219 Gastro-esophageal reflux disease without esophagitis: Secondary | ICD-10-CM | POA: Diagnosis not present

## 2023-04-05 DIAGNOSIS — I110 Hypertensive heart disease with heart failure: Secondary | ICD-10-CM | POA: Diagnosis not present

## 2023-04-05 DIAGNOSIS — J449 Chronic obstructive pulmonary disease, unspecified: Secondary | ICD-10-CM | POA: Diagnosis not present

## 2023-04-05 DIAGNOSIS — E559 Vitamin D deficiency, unspecified: Secondary | ICD-10-CM | POA: Diagnosis not present

## 2023-04-05 NOTE — Telephone Encounter (Signed)
NA

## 2023-04-06 ENCOUNTER — Telehealth: Payer: Self-pay

## 2023-04-06 ENCOUNTER — Telehealth: Payer: Self-pay | Admitting: Neurology

## 2023-04-06 NOTE — Telephone Encounter (Signed)
Pt called back in again and left a message. The pt is requesting a call back about lab results on her cell [phone]

## 2023-04-06 NOTE — Telephone Encounter (Signed)
Pt called in and left a message. She is returning a call about lab results

## 2023-04-06 NOTE — Telephone Encounter (Signed)
Pt called in and left a message yesterday. She is returning Renee's call

## 2023-04-07 ENCOUNTER — Telehealth: Payer: Self-pay

## 2023-04-07 DIAGNOSIS — S52502D Unspecified fracture of the lower end of left radius, subsequent encounter for closed fracture with routine healing: Secondary | ICD-10-CM | POA: Diagnosis not present

## 2023-04-07 DIAGNOSIS — M25511 Pain in right shoulder: Secondary | ICD-10-CM | POA: Diagnosis not present

## 2023-04-07 NOTE — Telephone Encounter (Signed)
Placed in DR.Neva Seat folder.

## 2023-04-07 NOTE — Telephone Encounter (Signed)
Paperwork completed and placed in fax bin at back nurse station  

## 2023-04-07 NOTE — Telephone Encounter (Signed)
Received fax from [facility] sent for review

## 2023-04-07 NOTE — Telephone Encounter (Signed)
Recently on 88 mcg, dosage was decreased in January to 50 mcg.  Please verify with patient, but she should also have refills available for the 50 mcg.

## 2023-04-08 ENCOUNTER — Ambulatory Visit: Payer: Medicare Other | Admitting: Neurology

## 2023-04-08 NOTE — Telephone Encounter (Signed)
Spoke with pt and she confirmed taking 50 mcg but wanted to discuss dosage change at next visit due to hair loss and voice changes.

## 2023-04-09 ENCOUNTER — Telehealth: Payer: Self-pay

## 2023-04-09 NOTE — Telephone Encounter (Signed)
Pt wants to speak with Dr hill Nurse about lab resuts

## 2023-04-09 NOTE — Telephone Encounter (Signed)
Called Pt back and gave her results per Dr. Loleta Chance. She understood and repeated it to me. Also told her , she could call [facility] to schedule her MRI if she would like. She said okay

## 2023-04-09 NOTE — Telephone Encounter (Signed)
Called pt at [phone]. Gave her the results per Dr. Loleta Chance on her voice mail again.

## 2023-04-16 DIAGNOSIS — S52502D Unspecified fracture of the lower end of left radius, subsequent encounter for closed fracture with routine healing: Secondary | ICD-10-CM | POA: Diagnosis not present

## 2023-04-16 DIAGNOSIS — M25511 Pain in right shoulder: Secondary | ICD-10-CM | POA: Diagnosis not present

## 2023-04-20 ENCOUNTER — Ambulatory Visit
Admission: RE | Admit: 2023-04-20 | Discharge: 2023-04-20 | Disposition: A | Discharge status: Home / Self Care | Payer: Medicare Other | Source: Ambulatory Visit | Attending: Neurology | Admitting: Neurology

## 2023-04-20 DIAGNOSIS — G319 Degenerative disease of nervous system, unspecified: Secondary | ICD-10-CM | POA: Diagnosis not present

## 2023-04-20 DIAGNOSIS — R410 Disorientation, unspecified: Secondary | ICD-10-CM

## 2023-04-20 DIAGNOSIS — G250 Essential tremor: Secondary | ICD-10-CM

## 2023-04-20 DIAGNOSIS — G4733 Obstructive sleep apnea (adult) (pediatric): Secondary | ICD-10-CM

## 2023-04-20 DIAGNOSIS — F0781 Postconcussional syndrome: Secondary | ICD-10-CM

## 2023-04-20 DIAGNOSIS — F341 Dysthymic disorder: Secondary | ICD-10-CM

## 2023-04-22 ENCOUNTER — Ambulatory Visit: Payer: Medicare Other | Admitting: Family Medicine

## 2023-04-23 ENCOUNTER — Telehealth: Payer: Self-pay | Admitting: Neurology

## 2023-04-23 ENCOUNTER — Ambulatory Visit
Admission: EM | Admit: 2023-04-23 | Discharge: 2023-04-23 | Disposition: A | Discharge status: Home / Self Care | Payer: Medicare Other | Attending: Family Medicine | Admitting: Family Medicine

## 2023-04-23 ENCOUNTER — Encounter: Payer: Self-pay | Admitting: Emergency Medicine

## 2023-04-23 ENCOUNTER — Ambulatory Visit: Payer: Medicare Other | Admitting: Cardiology

## 2023-04-23 ENCOUNTER — Other Ambulatory Visit: Payer: Self-pay | Admitting: Family Medicine

## 2023-04-23 DIAGNOSIS — E1129 Type 2 diabetes mellitus with other diabetic kidney complication: Secondary | ICD-10-CM

## 2023-04-23 DIAGNOSIS — E034 Atrophy of thyroid (acquired): Secondary | ICD-10-CM

## 2023-04-23 DIAGNOSIS — J069 Acute upper respiratory infection, unspecified: Secondary | ICD-10-CM | POA: Diagnosis not present

## 2023-04-23 MED ORDER — PROMETHAZINE-DM 6.25-15 MG/5ML PO SYRP: 5.0000 mL | ORAL_SOLUTION | Freq: Three times a day (TID) | ORAL | 0 refills | Status: DC | PRN

## 2023-04-23 MED ORDER — IPRATROPIUM BROMIDE 0.03 % NA SOLN: 2.0000 | Freq: Three times a day (TID) | NASAL | 0 refills | Status: AC | PRN

## 2023-04-23 NOTE — Telephone Encounter (Signed)
I notified patient of MRI results. She states that she has seen the eye doctor ad was told her eyes were fine but her vision continues to get worse. She just wanted you to know.

## 2023-04-23 NOTE — Telephone Encounter (Signed)
New message   MRI done on 04/20/23.  Patient is calling for MRI test results.

## 2023-04-23 NOTE — ED Provider Notes (Signed)
[facility]    CSN: [id number] Arrival date & time: 04/23/23  1849      History   Chief Complaint Chief Complaint  Patient presents with   Cough   Sore Throat   Generalized Body Aches    HPI Felicia Hanson is a 67 y.o. female.   HPI Patient presents today with a cough, sore throat, congestion and generalized body aches x 3 days. Patient reports that she was exposed to her granddaughter who had upper respiratory infection.  She has not taken anything for her cough or congestion.  Denies any shortness of breath or wheezing.  She has not had any fever and has no concerns for COVID or flu. Past Medical History:  Diagnosis Date   Allergy    Anxiety    CHF (congestive heart failure) (HCC) 01/20/2021   COPD (chronic obstructive pulmonary disease) (HCC)    Coronary artery calcification    Depression    Diabetes mellitus without complication (HCC)    Diverticulitis    with colon resection   GERD (gastroesophageal reflux disease)    on pepcid   Hyperlipidemia    Hypertension    Sleep apnea    cpap on order 01-01-22   Sleep paralysis    will be tested for narcolepsy per pt 01-01-22   Thyroid disease     Patient Active Problem List   Diagnosis Date Noted   Persistent depressive disorder 03/03/2023   Benign essential tremor 05/25/2022   Shortness of breath at rest 05/09/2021   Loud snoring 03/24/2021   Excessive daytime sleepiness 03/24/2021   Acute on chronic combined systolic and diastolic HF (heart failure) (HCC) 03/24/2021   Panlobular emphysema (HCC) 03/24/2021   GAD (generalized anxiety disorder) 03/24/2021   Atypical chest pain 01/22/2021   Respiratory distress 01/20/2021   Acute on chronic combined systolic and diastolic CHF (congestive heart failure) (HCC) 01/20/2021   Type 2 diabetes mellitus without complication, without long-term current use of insulin (HCC) 10/26/2017   Generalized anxiety disorder 01/10/2014   Vitamin D deficiency 01/10/2014    Essential hypertension, benign 01/10/2014   Pure hypercholesterolemia 01/10/2014   Hypothyroidism 01/10/2014    Past Surgical History:  Procedure Laterality Date   BREAST EXCISIONAL BIOPSY Left    CESAREAN SECTION     x2   CHOLECYSTECTOMY     COLON SURGERY     diverticulitis with perforation; s/p colon resection.   Colonoscopy     COLONOSCOPY     DENTAL SURGERY     RIGHT/LEFT HEART CATH AND CORONARY ANGIOGRAPHY N/A 01/22/2021   Procedure: RIGHT/LEFT HEART CATH AND CORONARY ANGIOGRAPHY;  Surgeon: Yates Decamp, MD;  Location: [facility];  Service: Cardiovascular;  Laterality: N/A;   TUBAL LIGATION      OB History   No obstetric history on file.      Home Medications    Prior to Admission medications   Medication Sig Start Date End Date Taking? Authorizing Provider  ipratropium (ATROVENT) 0.03 % nasal spray Place 2 sprays into both nostrils 3 (three) times daily as needed for rhinitis. 04/23/23  Yes Bing Neighbors, NP  promethazine-dextromethorphan (PROMETHAZINE-DM) 6.25-15 MG/5ML syrup Take 5 mLs by mouth 3 (three) times daily as needed for cough. 04/23/23  Yes Bing Neighbors, NP  ACCU-CHEK AVIVA PLUS test strip daily. for testing 03/11/18   [provider]  albuterol (VENTOLIN HFA) 108 (90 Base) MCG/ACT inhaler Inhale 1 puff into the lungs every 6 (six) hours as needed  for wheezing or shortness of breath.    [provider]  ALPRAZolam Prudy Feeler) 1 MG tablet Take 0.5 mg by mouth 3 (three) times daily as needed for anxiety or sleep. 08/21/13   [provider]  aspirin 81 MG tablet Take 81 mg by mouth daily.    [provider]  Azelastine HCl 137 MCG/SPRAY SOLN PLACE 1 SPRAY INTO BOTH NOSTRILS 2 (TWO) TIMES DAILY. USE IN EACH NOSTRIL AS DIRECTED IF RUNNY NOSE/DRAINAGE. 05/26/22   Shade Flood, MD  BLACK ELDERBERRY PO Take 10 mLs by mouth daily.    [provider]  cephALEXin (KEFLEX) 500 MG capsule Take 1 capsule (500 mg  total) by mouth 2 (two) times daily. Patient not taking: Reported on 03/25/2023 02/24/23   Shade Flood, MD  cetirizine (ZYRTEC) 10 MG tablet Take 10 mg by mouth daily.    [provider]  Continuous Blood Gluc Receiver (DEXCOM G6 RECEIVER) DEVI 1 Device by Does not apply route every morning. Device and supplies 04/18/20   Shade Flood, MD  dapagliflozin propanediol (FARXIGA) 10 MG TABS tablet Take 1 tablet (10 mg total) by mouth daily. 04/30/22   Tolia, Sunit, DO  doxycycline (VIBRA-TABS) 100 MG tablet Take 1 tablet (100 mg total) by mouth 2 (two) times daily. Patient not taking: Reported on 03/25/2023 03/01/23   Shade Flood, MD  Dulaglutide (TRULICITY) 0.75 MG/0.5ML SOPN Inject 0.75 mg into the skin once a week. 03/04/23   Shade Flood, MD  DULoxetine (CYMBALTA) 60 MG capsule TAKE 2 CAPSULES BY MOUTH DAILY 02/17/23   Shade Flood, MD  ezetimibe (ZETIA) 10 MG tablet TAKE 1 TABLET BY MOUTH EVERY DAY 04/02/23   Shade Flood, MD  famotidine (PEPCID) 20 MG tablet Take 20 mg by mouth daily as needed for heartburn or indigestion.    [provider]  fenofibrate (TRICOR) 145 MG tablet Take 1 tablet (145 mg total) by mouth daily. 09/28/22 12/27/22  Tolia, Sunit, DO  fluticasone (FLONASE) 50 MCG/ACT nasal spray Place 2 sprays into both nostrils daily. 03/15/22   Leath-Warren, Sadie Haber, NP  Fluticasone Furoate (ARNUITY ELLIPTA) 100 MCG/ACT AEPB Inhale 1 Inhalation into the lungs daily. 02/17/23   Shade Flood, MD  HYDROcodone-acetaminophen (NORCO/VICODIN) 5-325 MG tablet Take 1 tablet by mouth every 6 (six) hours as needed. 02/20/23   Shade Flood, MD  insulin glargine (LANTUS SOLOSTAR) 100 UNIT/ML Solostar Pen INJECT 54 UNITS INTO THE SKIN AT BEDTIME. 04/05/23   Shade Flood, MD  Insulin Pen Needle 31G X 5 MM MISC Inject 1 each into the skin daily. 02/10/21   Shade Flood, MD  levothyroxine (SYNTHROID) 50 MCG tablet TAKE 1 TABLET BY MOUTH EVERY DAY 02/16/23    Shade Flood, MD  magnesium oxide (MAG-OX) 400 MG tablet TAKE 0.5 TABLETS (200 MG TOTAL) BY MOUTH 2 TIMES DAILY. 02/17/23   Tolia, Sunit, DO  metoprolol succinate (TOPROL-XL) 100 MG 24 hr tablet TAKE 1 TABLET BY MOUTH EVERY DAY WITH OR IMMEDIATELY FOLLOWING A MEAL 10/26/22   Tolia, Sunit, DO  nirmatrelvir & ritonavir (PAXLOVID, 150/100,) 10 x 150 MG & 10 x 100MG  TBPK Take 150 mg nirmatrelvir and 100 mg ritonavir PO BID for 5 days 08/30/22   Tomi Bamberger, PA-C  nitroGLYCERIN (NITROSTAT) 0.4 MG SL tablet PLACE 1 TABLET UNDER THE TONGUE EVERY 5 MINUTES AS NEEDED FOR CHEST PAIN. IF YOU REQUIRE MORE THAN TWO TABLETS FIVE MINUTES APART GO TO THE NEAREST ER  VIA EMS. Patient taking differently: Place 0.4 mg under the tongue every 5 (five) minutes as needed for chest pain. 07/29/20   Tolia, Sunit, DO  omeprazole (PRILOSEC) 20 MG capsule TAKE 1 CAPSULE BY MOUTH EVERY DAY 01/11/23   Shade Flood, MD  ondansetron (ZOFRAN-ODT) 4 MG disintegrating tablet Take 1 tablet (4 mg total) by mouth every 8 (eight) hours as needed. 02/01/22   Tomi Bamberger, PA-C  pravastatin (PRAVACHOL) 80 MG tablet Take 1 tablet (80 mg total) by mouth daily. TAKE 1 TABLET BY MOUTH EVERY DAY IN THE EVENING 10/28/22   Shade Flood, MD  predniSONE (DELTASONE) 20 MG tablet Take 2 tablets (40 mg total) by mouth daily with breakfast. 03/26/22   Shade Flood, MD  sacubitril-valsartan (ENTRESTO) 97-103 MG Take 1 tablet by mouth 2 (two) times daily. 12/03/22   Tolia, Sunit, DO  spironolactone (ALDACTONE) 25 MG tablet Take 1 tablet (25 mg total) by mouth daily. 02/17/23   Shade Flood, MD  Vitamin D, Ergocalciferol, (DRISDOL) 1.25 MG (50000 UNIT) CAPS capsule TAKE 1 CAPSULE (50,000 UNITS TOTAL) BY MOUTH EVERY 7 (SEVEN) DAYS 04/02/23   Shade Flood, MD    Family History Family History  Problem Relation Age of Onset   Colon polyps Mother    Cancer Mother        unknown primary   Hyperlipidemia Mother    Other Father     Breast cancer Paternal Aunt    Esophageal cancer Paternal Uncle    Colon cancer Neg Hx    Rectal cancer Neg Hx    Stomach cancer Neg Hx     Social History Social History   Tobacco Use   Smoking status: Every Day    Packs/day: 0.25    Years: 38.00    Additional pack years: 0.00    Total pack years: 9.50    Types: Cigarettes   Smokeless tobacco: Never   Tobacco comments:    2-5 A DAY  Vaping Use   Vaping Use: Never used  Substance Use Topics   Alcohol use: No    Alcohol/week: 0.0 standard drinks of alcohol   Drug use: No     Allergies   Metformin and related, Nicotine, and Ace inhibitors   Review of Systems Review of Systems Pertinent negatives listed in HPI   Physical Exam Triage Vital Signs ED Triage Vitals [04/23/23 1937]  Enc Vitals Group     BP      Pulse      Resp      Temp      Temp src      SpO2      Weight      Height      Head Circumference      Peak Flow      Pain Score 5     Pain Loc      Pain Edu?      Excl. in GC?    No data found.  Updated Vital Signs BP (!) 142/81 (BP Location: Left Arm)   Pulse 75   Temp 98.4 F (36.9 C) (Oral)   Resp 16   SpO2 97%   Visual Acuity Right Eye Distance:   Left Eye Distance:   Bilateral Distance:    Right Eye Near:   Left Eye Near:    Bilateral Near:     Physical Exam Constitutional:      Appearance: She is well-developed.  HENT:     Head: Normocephalic  and atraumatic.     Right Ear: Tympanic membrane normal.     Left Ear: Tympanic membrane normal.     Nose: Congestion and rhinorrhea present.  Eyes:     Conjunctiva/sclera: Conjunctivae normal.     Pupils: Pupils are equal, round, and reactive to light.  Cardiovascular:     Rate and Rhythm: Normal rate and regular rhythm.  Pulmonary:     Effort: Pulmonary effort is normal.     Breath sounds: Normal breath sounds.  Lymphadenopathy:     Cervical: No cervical adenopathy.  Neurological:     General: No focal deficit present.      Mental Status: She is alert.      UC Treatments / Results  Labs (all labs ordered are listed, but only abnormal results are displayed) Labs Reviewed - No data to display   EKG   Radiology No results found.  Procedures Procedures (including critical care time)  Medications Ordered in UC Medications - No data to display  Initial Impression / Assessment and Plan / UC Course  I have reviewed the triage vital signs and the nursing notes.  Pertinent labs & imaging results that were available during my care of the patient were reviewed by me and considered in my medical decision making (see chart for details).    VRI, no concerns for COPD exacerbation. Patient does have quite a bit of congestion however has not taken any medications.  Conservative treatment at present with Promethazine DM for cough and Atrovent nasal spray for nasal symptoms.  Return if symptoms worsen or do not readily improve. Final Clinical Impressions(s) / UC Diagnoses   Final diagnoses:  Viral upper respiratory illness   Discharge Instructions   None    ED Prescriptions     Medication Sig Dispense Auth. Provider   promethazine-dextromethorphan (PROMETHAZINE-DM) 6.25-15 MG/5ML syrup Take 5 mLs by mouth 3 (three) times daily as needed for cough. 180 mL Bing Neighbors, NP   ipratropium (ATROVENT) 0.03 % nasal spray Place 2 sprays into both nostrils 3 (three) times daily as needed for rhinitis. 30 mL Bing Neighbors, NP      PDMP not reviewed this encounter.   Bing Neighbors, NP 04/24/23 [phone]

## 2023-04-23 NOTE — ED Triage Notes (Addendum)
Pt said she has been having cough, congestion, body aches, x 3 days. Phlegm is clear, Pt said her grand daughter was dx with respiratory illness. Pt said she has not been taking anything for her symptoms. No fevers, no ear pain,

## 2023-04-28 ENCOUNTER — Telehealth: Payer: Self-pay

## 2023-04-28 NOTE — Telephone Encounter (Signed)
Pt called informed Dr Loleta Chance reviewed your MRI brain. There was nothing of concern seen. There is some mild atrophy noted (which means loss of brain tissue), which happens as we age. We can discuss this more when you follow up.

## 2023-04-28 NOTE — Telephone Encounter (Signed)
-----   Message from Antony Madura, MD sent at 04/26/2023  8:42 PM EDT ----- Can we let patient know the results of her MRI brain? I tried to message her, but she did not read it.  "I reviewed your MRI brain. There was nothing of concern seen. There is some mild atrophy noted (which means loss of brain tissue), which happens as we age. We can discuss this more when you follow up. Please let me know if you have any questions or concerns in the meantime."  Thank you, Felicia Balint, MD

## 2023-05-01 ENCOUNTER — Other Ambulatory Visit: Payer: Self-pay | Admitting: Family Medicine

## 2023-05-01 DIAGNOSIS — R809 Proteinuria, unspecified: Secondary | ICD-10-CM

## 2023-05-03 NOTE — Telephone Encounter (Signed)
Pharmacy called stating Lantus insulin is not covered by pt insurance . They are asking for [place] to change it ? Did not give [place] any alternatives to chose from

## 2023-05-03 NOTE — Telephone Encounter (Signed)
Basaglar may be an option - see if that's covered. Also verify her current dose - she was not seen 5/23 with me as planned and concern for too much insulin at recent care coordination visit with pharmacist.

## 2023-05-04 ENCOUNTER — Other Ambulatory Visit: Payer: Self-pay

## 2023-05-04 DIAGNOSIS — S81011D Laceration without foreign body, right knee, subsequent encounter: Secondary | ICD-10-CM

## 2023-05-04 MED ORDER — DAPAGLIFLOZIN PROPANEDIOL 10 MG PO TABS: 10.0000 mg | ORAL_TABLET | Freq: Every day | ORAL | 3 refills | Status: DC

## 2023-05-04 NOTE — Telephone Encounter (Signed)
Please see prior request/notes.  Let me know if I need to enter this differently so it is easier to see.  Thanks.

## 2023-05-07 ENCOUNTER — Other Ambulatory Visit: Payer: Self-pay

## 2023-05-07 MED ORDER — TOUJEO SOLOSTAR 300 UNIT/ML ~~LOC~~ SOPN
50.0000 [IU] | PEN_INJECTOR | Freq: Every day | SUBCUTANEOUS | 1 refills | Status: DC
Start: 2023-05-07 — End: 2023-05-07

## 2023-05-07 NOTE — Telephone Encounter (Signed)
I have called the pt and we went over her current med list . She states she got her Lantus Solostar  insulin and is taking 54 units . The pharmacist helped her get the medicine and she states gets this medicine from [facility]. Pt states she currently takes Trulicity 0.75 mg once a week as well . Her blood sugars per pt are between 100-120 not below 100 . She states she is not taking Toujeo Solostar so I did removed that from her med list .  Pt states she is feeling better and doing well at the moment .  Pt states her Trulicity comes from [facility] .

## 2023-05-07 NOTE — Telephone Encounter (Addendum)
Called patient.  Currently using lantus 54 units.  Home reading 156 the other night.  Had shaky spells at times few times few weeks ago - unable to get glucometer work at that time. No measured readings under 100.   Lab Results  Component Value Date   HGBA1C 5.4 11/13/2022   Will order toujeo but decrease to 50 units for now, and has decreased trulicity prior.  Requested office visit next few weeks, updated A1c at that time.  Advised to contact me if any further shaky spells or low blood sugars.  Understanding expressed.

## 2023-05-07 NOTE — Addendum Note (Signed)
Addended by: Meredith Staggers R on: 05/07/2023 01:44 PM   Modules accepted: Orders

## 2023-05-11 ENCOUNTER — Ambulatory Visit: Payer: Medicare Other | Admitting: Cardiology

## 2023-05-11 ENCOUNTER — Encounter: Payer: Self-pay | Admitting: Cardiology

## 2023-05-11 VITALS — BP 139/74 | HR 82 | Ht 60.0 in | Wt 144.2 lb

## 2023-05-11 DIAGNOSIS — I447 Left bundle-branch block, unspecified: Secondary | ICD-10-CM

## 2023-05-11 DIAGNOSIS — I428 Other cardiomyopathies: Secondary | ICD-10-CM | POA: Diagnosis not present

## 2023-05-11 DIAGNOSIS — E7800 Pure hypercholesterolemia, unspecified: Secondary | ICD-10-CM | POA: Diagnosis not present

## 2023-05-11 DIAGNOSIS — E1165 Type 2 diabetes mellitus with hyperglycemia: Secondary | ICD-10-CM

## 2023-05-11 DIAGNOSIS — I2510 Atherosclerotic heart disease of native coronary artery without angina pectoris: Secondary | ICD-10-CM

## 2023-05-11 DIAGNOSIS — F17210 Nicotine dependence, cigarettes, uncomplicated: Secondary | ICD-10-CM | POA: Diagnosis not present

## 2023-05-11 DIAGNOSIS — E781 Pure hyperglyceridemia: Secondary | ICD-10-CM

## 2023-05-11 DIAGNOSIS — I5042 Chronic combined systolic (congestive) and diastolic (congestive) heart failure: Secondary | ICD-10-CM

## 2023-05-11 DIAGNOSIS — I10 Essential (primary) hypertension: Secondary | ICD-10-CM | POA: Diagnosis not present

## 2023-05-11 DIAGNOSIS — Z794 Long term (current) use of insulin: Secondary | ICD-10-CM | POA: Diagnosis not present

## 2023-05-11 NOTE — Progress Notes (Signed)
ID:  Felicia Hanson, DOB February 09, 1956, MRN [id number]  PCP:  Shade Flood, MD  Cardiologist:  Tessa Lerner, DO, [facility] (established care 04/22/2020)  Date: 05/11/23 Last Office Visit: 09/28/2022  Chief Complaint  Patient presents with   Follow-up    37-month follow-up visit HFrEF   HPI   Felicia Hanson is a 68 y.o. female whose past medical history and cardiovascular risk factors are: COVID-19 infection, insulin-dependent type 2 diabetes with hyperglycemia, pure hypercholesterolemia, hypertriglyceridemia, coronary artery calcification, nonischemic cardiomyopathy, left bundle branch block, essential hypertension, hypothyroidism, tobacco use disorder, vitamin D deficiency, generalized anxiety disorder.  Patient is being followed by the practice for the management of HFrEF.  She presents today for 58-month follow-up visit.  Since last office visit she denies anginal chest pain or heart failure symptoms.  No hospitalizations due to cardiac symptoms.  No use of sublingual nitroglycerin tablets.  Due to lifestyle changes she has lost approximately 5 pounds since last office visit.  She was prescribed fenofibrate to help improve her triglycerides but for reasons unknown she has not started the medication.  No recent lipids available for review.  Unfortunately, she had increased the amount of cigarette smoking 0.75 packs/day to help manage his stress (per patient).  FUNCTIONAL STATUS: No structured exercise program or daily routine.  But does actively take care of her grandkids.  ALLERGIES: Allergies  Allergen Reactions   Metformin And Related Anaphylaxis and Other (See Comments)    Could not swallow   Nicotine Rash    Only allergic to nicotine patch   Ace Inhibitors Cough    Cough with lisinopril.     MEDICATION LIST PRIOR TO VISIT: Current Meds  Medication Sig   ACCU-CHEK AVIVA PLUS test strip daily. for testing   albuterol (VENTOLIN HFA) 108 (90 Base) MCG/ACT inhaler Inhale 1  puff into the lungs every 6 (six) hours as needed for wheezing or shortness of breath.   ALPRAZolam (XANAX) 1 MG tablet Take 0.5 mg by mouth 3 (three) times daily as needed for anxiety or sleep.   aspirin 81 MG tablet Take 81 mg by mouth daily.   Azelastine HCl 137 MCG/SPRAY SOLN PLACE 1 SPRAY INTO BOTH NOSTRILS 2 (TWO) TIMES DAILY. USE IN EACH NOSTRIL AS DIRECTED IF RUNNY NOSE/DRAINAGE.   BLACK ELDERBERRY PO Take 10 mLs by mouth daily.   cetirizine (ZYRTEC) 10 MG tablet Take 10 mg by mouth daily.   Continuous Blood Gluc Receiver (DEXCOM G6 RECEIVER) DEVI 1 Device by Does not apply route every morning. Device and supplies   dapagliflozin propanediol (FARXIGA) 10 MG TABS tablet Take 1 tablet (10 mg total) by mouth daily.   Dulaglutide (TRULICITY) 0.75 MG/0.5ML SOPN Inject 0.75 mg into the skin once a week.   DULoxetine (CYMBALTA) 60 MG capsule TAKE 2 CAPSULES BY MOUTH DAILY   ezetimibe (ZETIA) 10 MG tablet TAKE 1 TABLET BY MOUTH EVERY DAY   famotidine (PEPCID) 10 MG tablet Take 10 mg by mouth 2 (two) times daily.   fenofibrate (TRICOR) 145 MG tablet Take 1 tablet (145 mg total) by mouth daily.   fluticasone (FLONASE) 50 MCG/ACT nasal spray Place 2 sprays into both nostrils daily.   Fluticasone Furoate (ARNUITY ELLIPTA) 100 MCG/ACT AEPB Inhale 1 Inhalation into the lungs daily.   Insulin Pen Needle 31G X 5 MM MISC Inject 1 each into the skin daily.   ipratropium (ATROVENT) 0.03 % nasal spray Place 2 sprays into both nostrils 3 (three) times daily as needed for  rhinitis.   levothyroxine (SYNTHROID) 50 MCG tablet TAKE 1 TABLET BY MOUTH EVERY DAY   magnesium oxide (MAG-OX) 400 MG tablet TAKE 0.5 TABLETS (200 MG TOTAL) BY MOUTH 2 TIMES DAILY.   metoprolol succinate (TOPROL-XL) 100 MG 24 hr tablet TAKE 1 TABLET BY MOUTH EVERY DAY WITH OR IMMEDIATELY FOLLOWING A MEAL   nitroGLYCERIN (NITROSTAT) 0.4 MG SL tablet PLACE 1 TABLET UNDER THE TONGUE EVERY 5 MINUTES AS NEEDED FOR CHEST PAIN. IF YOU REQUIRE MORE  THAN TWO TABLETS FIVE MINUTES APART GO TO THE NEAREST ER VIA EMS. (Patient taking differently: Place 0.4 mg under the tongue every 5 (five) minutes as needed for chest pain.)   omeprazole (PRILOSEC) 20 MG capsule TAKE 1 CAPSULE BY MOUTH EVERY DAY   pravastatin (PRAVACHOL) 80 MG tablet Take 1 tablet (80 mg total) by mouth daily. TAKE 1 TABLET BY MOUTH EVERY DAY IN THE EVENING   sacubitril-valsartan (ENTRESTO) 97-103 MG Take 1 tablet by mouth 2 (two) times daily.   spironolactone (ALDACTONE) 25 MG tablet Take 1 tablet (25 mg total) by mouth daily.   vitamin B-12 (CYANOCOBALAMIN) 100 MCG tablet Take 1,000 mcg by mouth daily.   Vitamin D, Ergocalciferol, (DRISDOL) 1.25 MG (50000 UNIT) CAPS capsule TAKE 1 CAPSULE (50,000 UNITS TOTAL) BY MOUTH EVERY 7 (SEVEN) DAYS   [DISCONTINUED] cephALEXin (KEFLEX) 500 MG capsule Take 1 capsule (500 mg total) by mouth 2 (two) times daily.   [DISCONTINUED] famotidine (PEPCID) 20 MG tablet Take 20 mg by mouth daily as needed for heartburn or indigestion.     PAST MEDICAL HISTORY: Past Medical History:  Diagnosis Date   Allergy    Anxiety    CHF (congestive heart failure) (HCC) 01/20/2021   COPD (chronic obstructive pulmonary disease) (HCC)    Coronary artery calcification    Depression    Diabetes mellitus without complication (HCC)    Diverticulitis    with colon resection   GERD (gastroesophageal reflux disease)    on pepcid   Hyperlipidemia    Hypertension    Sleep apnea    cpap on order 01-01-22   Sleep paralysis    will be tested for narcolepsy per pt 01-01-22   Thyroid disease     PAST SURGICAL HISTORY: Past Surgical History:  Procedure Laterality Date   BREAST EXCISIONAL BIOPSY Left    CESAREAN SECTION     x2   CHOLECYSTECTOMY     COLON SURGERY     diverticulitis with perforation; s/p colon resection.   Colonoscopy     COLONOSCOPY     DENTAL SURGERY     RIGHT/LEFT HEART CATH AND CORONARY ANGIOGRAPHY N/A 01/22/2021   Procedure: RIGHT/LEFT  HEART CATH AND CORONARY ANGIOGRAPHY;  Surgeon: Yates Decamp, MD;  Location: [facility];  Service: Cardiovascular;  Laterality: N/A;   TUBAL LIGATION      FAMILY HISTORY: The patient family history includes Breast cancer in her paternal aunt; Cancer in her mother; Colon polyps in her mother; Esophageal cancer in her paternal uncle; Hyperlipidemia in her mother; Other in her father.  SOCIAL HISTORY:  The patient  reports that she has been smoking cigarettes. She has a 9.50 pack-year smoking history. She has never used smokeless tobacco. She reports that she does not drink alcohol and does not use drugs.  REVIEW OF SYSTEMS: Review of Systems  Constitutional: Negative for chills and fever.  HENT:  Negative for hoarse voice and nosebleeds.   Eyes:  Negative for discharge, double vision and pain.  Cardiovascular:  Negative for chest pain, claudication, dyspnea on exertion, leg swelling, near-syncope, orthopnea, palpitations, paroxysmal nocturnal dyspnea and syncope.  Respiratory:  Negative for hemoptysis and shortness of breath.   Musculoskeletal:  Negative for muscle cramps and myalgias.  Gastrointestinal:  Negative for abdominal pain, constipation, diarrhea, hematemesis, hematochezia, melena, nausea and vomiting.  Neurological:  Negative for dizziness and light-headedness.    PHYSICAL EXAM:    05/11/2023    3:46 PM 04/23/2023    7:38 PM 03/25/2023    1:14 PM  Vitals with BMI  Height 5\' 0"     Weight 144 lbs 3 oz    BMI 28.16    Systolic 139 142 454  Diastolic 74 81 66  Pulse 82 75    Physical Exam  Constitutional: No distress.  Appears older than stated age, hemodynamically stable.   Neck: No JVD present.  Cardiovascular: Normal rate, regular rhythm, S1 normal, S2 normal, intact distal pulses and normal pulses. Exam reveals no gallop, no S3 and no S4.  No murmur heard. Pulmonary/Chest: Effort normal and breath sounds normal. No stridor. She has no wheezes. She has no rales.   Abdominal: Soft. Bowel sounds are normal. She exhibits no distension. There is no abdominal tenderness.  Musculoskeletal:        General: No edema.     Cervical back: Neck supple.  Neurological: She is alert and oriented to person, place, and time. She has intact cranial nerves (2-12).  Skin: Skin is warm and moist.   CARDIAC DATABASE:  EKG:  05/11/2023: NSR, 76bpm, LBBB.  Coronary calcium scoring 05/13/2015: LAD: 8, Cx: 2. Total calcium score is 10.   Echocardiogram: 01/20/2021: LVEF 35-40%  05/15/2021: Technically difficult study. Left ventricle cavity is normal in size. Moderate concentric hypertrophy of the left ventricle. Mild global hypokinesis. LVEF 40-45%. Indeterminate diastolic filling pattern.  No significant valvular abnormality. Normal right atrial pressure.  Compared to previous study on 04/23/2020, LVEF is marginally improved form 35-40%.  Stress Testing: Lexiscan Tetrofosmin Stress Test  05/13/2020:  There is a fixed mild defect in the inferior region due to diaphragmatic attenuation. No ischemia or scar.  The LV is moderately dilated with LV end diastolic volume of 173 mL.  Overall LV systolic function is abnormal without regional wall motion abnormalities. Stress LV EF: 21%.   High risk study due to low LVEF. Findings suggest non ischemic dilated cardiomyopathy.  Heart Catheterization: Right + left heart catheterization 01/22/2021: RA 16/12, mean 11 mmHg, RA saturation 80%. RV 47/8, EDP 15 mmHg. PA 41/26, mean 33 mmHg.  PA saturation 77%. PW 32/26, mean 29 mmHg.  Aortic saturation 98%. Cardiac output 6.58, cardiac index 3.80 by Fick.  Stroke-volume 70 mL.  SVR 16.87, PVR 1.05.  QP/QS 0.86. LV: Global hypokinesis, EF 35%.  EDP markedly elevated at 32 mmHg.  There was no pressure gradient across the aortic valve. Left main: Mild disease. LAD: Mild diffuse disease. CX: Mild diffuse disease.  I was told he was 11 something right RI: Mild diffuse disease. RCA:  Dominant, mild diffuse disease.   Impression: Nonischemic cardiomyopathy with severe LV systolic dysfunction.  Mild to moderate pulmonary hypertension secondary to diastolic dysfunction with LV EDP elevation.  LABORATORY DATA:    Latest Ref Rng & Units 02/03/2023    8:49 PM 02/03/2023    8:48 PM 12/16/2022    3:14 PM  CBC  WBC 4.0 - 10.5 K/uL 12.9   10.5   Hemoglobin 12.0 - 15.0 g/dL 09.8  14.6  14.6   Hematocrit 36.0 - 46.0 % 42.4  43.0  43.4   Platelets 150 - 400 K/uL 205   237.0        Latest Ref Rng & Units 02/03/2023    8:49 PM 02/03/2023    8:48 PM 12/16/2022    3:14 PM  CMP  Glucose 70 - 99 mg/dL 76  77  161   BUN 8 - 23 mg/dL 20  20  14    Creatinine 0.44 - 1.00 mg/dL 0.96  0.45  4.09   Sodium 135 - 145 mmol/L 139  141  141   Potassium 3.5 - 5.1 mmol/L 3.9  3.8  4.8   Chloride 98 - 111 mmol/L 107  105  107   CO2 22 - 32 mmol/L 19   25   Calcium 8.9 - 10.3 mg/dL 9.2   9.3   Total Protein 6.5 - 8.1 g/dL 7.3     Total Bilirubin 0.3 - 1.2 mg/dL 0.8     Alkaline Phos 38 - 126 U/L 69     AST 15 - 41 U/L 26     ALT 0 - 44 U/L 14       Lipid Panel  Lab Results  Component Value Date   CHOL 132 08/05/2022   HDL 37.60 (L) 08/05/2022   LDLCALC 54 12/03/2021   LDLDIRECT 69.0 08/05/2022   TRIG 228.0 (H) 08/05/2022   CHOLHDL 4 08/05/2022    Lipid Panel Recent Labs    08/05/22 1404  CHOL 132  TRIG 228.0*  VLDL 45.6*  HDL 37.60*  CHOLHDL 4  LDLDIRECT 69.0    Lab Results  Component Value Date   HGBA1C 5.4 11/13/2022   HGBA1C 5.9 08/05/2022   HGBA1C 6.7 (H) 12/03/2021   No components found for: "NTPROBNP" Lab Results  Component Value Date   TSH 1.310 03/25/2023   TSH 0.03 (L) 12/16/2022   TSH 0.01 (L) 11/13/2022    IMPRESSION:    ICD-10-CM   1. Chronic combined systolic and diastolic heart failure (HCC)  W11.91 EKG 12-Lead    ECHOCARDIOGRAM COMPLETE    2. Nonischemic cardiomyopathy (HCC)  I42.8 ECHOCARDIOGRAM COMPLETE    3. LBBB (left bundle branch block)   I44.7 ECHOCARDIOGRAM COMPLETE    4. Coronary artery calcification seen on computed tomography  I25.10 Lipid Panel With LDL/HDL Ratio    LDL cholesterol, direct    CMP14+EGFR    5. Essential hypertension, benign  I10     6. Type 2 diabetes mellitus with hyperglycemia, with long-term current use of insulin (HCC)  E11.65    Z79.4     7. Hypertriglyceridemia  E78.1 Lipid Panel With LDL/HDL Ratio    LDL cholesterol, direct    CMP14+EGFR    8. Hypercholesterolemia  E78.00 Lipid Panel With LDL/HDL Ratio    LDL cholesterol, direct    CMP14+EGFR    9. Cigarette smoker  F17.210        RECOMMENDATIONS: Felicia Hanson is a 67 y.o. female whose past medical history and cardiac risk factors include:  insulin-dependent type 2 diabetes with hyperglycemia, pure hypercholesterolemia, hypertriglyceridemia, coronary artery calcification, nonischemic cardiomyopathy, left bundle branch block, essential hypertension, hypothyroidism, tobacco use disorder, vitamin D deficiency, generalized anxiety disorder.  Chronic combined systolic and diastolic heart failure (HCC) Nonischemic cardiomyopathy (HCC) LBBB (left bundle branch block) Coronary artery calcification seen on computed tomography Stage C, NYHA class II. Last heart failure hospitalization February 2022. Most recent echocardiogram notes mildly reduced LVEF. Echo will be  ordered to evaluate for structural heart disease and left ventricular systolic function. Reemphasized the importance of glycemic control, hypertension, hyperlipidemia/triglyceride management. Reemphasized importance of smoking cessation.  Essential hypertension, benign Office blood pressures within acceptable limits. No change in medical therapy.  Type 2 diabetes mellitus with hyperglycemia, with long-term current use of insulin (HCC) Hypertriglyceridemia Hypercholesterolemia Reemphasized importance of glycemic control. Currently on statin therapy, Zetia. Was prescribed  fenofibrate at last office visit but for reasons unknown he is not taking it. Patient is asked to start fenofibrate in 6 weeks later to have fasting lipid profile to evaluate her lipids  Cigarette smoker Tobacco cessation counseling: Currently smoking 0.75 packs/day   Patient was informed of the dangers of tobacco abuse including stroke, cancer, and MI, as well as benefits of tobacco cessation. Patient is willing to quit at this time. 7 mins were spent counseling patient cessation techniques. We discussed various methods to help quit smoking, including deciding on a date to quit, joining a support group, pharmacological agents- nicotine gum/patch/lozenges.  I will reassess her progress at the next follow-up visit  FINAL MEDICATION LIST END OF ENCOUNTER: No orders of the defined types were placed in this encounter.    Medications Discontinued During This Encounter  Medication Reason   cephALEXin (KEFLEX) 500 MG capsule Completed Course   promethazine-dextromethorphan (PROMETHAZINE-DM) 6.25-15 MG/5ML syrup Completed Course   predniSONE (DELTASONE) 20 MG tablet Completed Course   ondansetron (ZOFRAN-ODT) 4 MG disintegrating tablet Completed Course   HYDROcodone-acetaminophen (NORCO/VICODIN) 5-325 MG tablet Completed Course   doxycycline (VIBRA-TABS) 100 MG tablet Completed Course   famotidine (PEPCID) 20 MG tablet Completed Course      Current Outpatient Medications:    ACCU-CHEK AVIVA PLUS test strip, daily. for testing, Disp: , Rfl: 2   albuterol (VENTOLIN HFA) 108 (90 Base) MCG/ACT inhaler, Inhale 1 puff into the lungs every 6 (six) hours as needed for wheezing or shortness of breath., Disp: , Rfl:    ALPRAZolam (XANAX) 1 MG tablet, Take 0.5 mg by mouth 3 (three) times daily as needed for anxiety or sleep., Disp: , Rfl:    aspirin 81 MG tablet, Take 81 mg by mouth daily., Disp: , Rfl:    Azelastine HCl 137 MCG/SPRAY SOLN, PLACE 1 SPRAY INTO BOTH NOSTRILS 2 (TWO) TIMES DAILY. USE IN  EACH NOSTRIL AS DIRECTED IF RUNNY NOSE/DRAINAGE., Disp: 1 mL, Rfl: 1   BLACK ELDERBERRY PO, Take 10 mLs by mouth daily., Disp: , Rfl:    cetirizine (ZYRTEC) 10 MG tablet, Take 10 mg by mouth daily., Disp: , Rfl:    Continuous Blood Gluc Receiver (DEXCOM G6 RECEIVER) DEVI, 1 Device by Does not apply route every morning. Device and supplies, Disp: 1 each, Rfl: 0   dapagliflozin propanediol (FARXIGA) 10 MG TABS tablet, Take 1 tablet (10 mg total) by mouth daily., Disp: 90 tablet, Rfl: 3   Dulaglutide (TRULICITY) 0.75 MG/0.5ML SOPN, Inject 0.75 mg into the skin once a week., Disp: 2 mL, Rfl: 2   DULoxetine (CYMBALTA) 60 MG capsule, TAKE 2 CAPSULES BY MOUTH DAILY, Disp: 180 capsule, Rfl: 1   ezetimibe (ZETIA) 10 MG tablet, TAKE 1 TABLET BY MOUTH EVERY DAY, Disp: 90 tablet, Rfl: 0   famotidine (PEPCID) 10 MG tablet, Take 10 mg by mouth 2 (two) times daily., Disp: , Rfl:    fenofibrate (TRICOR) 145 MG tablet, Take 1 tablet (145 mg total) by mouth daily., Disp: 90 tablet, Rfl: 0   fluticasone (FLONASE) 50 MCG/ACT nasal spray, Place 2 sprays  into both nostrils daily., Disp: 16 g, Rfl: 0   Fluticasone Furoate (ARNUITY ELLIPTA) 100 MCG/ACT AEPB, Inhale 1 Inhalation into the lungs daily., Disp: 30 each, Rfl: 5   Insulin Pen Needle 31G X 5 MM MISC, Inject 1 each into the skin daily., Disp: 100 each, Rfl: 2   ipratropium (ATROVENT) 0.03 % nasal spray, Place 2 sprays into both nostrils 3 (three) times daily as needed for rhinitis., Disp: 30 mL, Rfl: 0   levothyroxine (SYNTHROID) 50 MCG tablet, TAKE 1 TABLET BY MOUTH EVERY DAY, Disp: 90 tablet, Rfl: 1   magnesium oxide (MAG-OX) 400 MG tablet, TAKE 0.5 TABLETS (200 MG TOTAL) BY MOUTH 2 TIMES DAILY., Disp: 90 tablet, Rfl: 1   metoprolol succinate (TOPROL-XL) 100 MG 24 hr tablet, TAKE 1 TABLET BY MOUTH EVERY DAY WITH OR IMMEDIATELY FOLLOWING A MEAL, Disp: 90 tablet, Rfl: 2   nitroGLYCERIN (NITROSTAT) 0.4 MG SL tablet, PLACE 1 TABLET UNDER THE TONGUE EVERY 5 MINUTES AS  NEEDED FOR CHEST PAIN. IF YOU REQUIRE MORE THAN TWO TABLETS FIVE MINUTES APART GO TO THE NEAREST ER VIA EMS. (Patient taking differently: Place 0.4 mg under the tongue every 5 (five) minutes as needed for chest pain.), Disp: 25 tablet, Rfl: 1   omeprazole (PRILOSEC) 20 MG capsule, TAKE 1 CAPSULE BY MOUTH EVERY DAY, Disp: 90 capsule, Rfl: 1   pravastatin (PRAVACHOL) 80 MG tablet, Take 1 tablet (80 mg total) by mouth daily. TAKE 1 TABLET BY MOUTH EVERY DAY IN THE EVENING, Disp: 90 tablet, Rfl: 1   sacubitril-valsartan (ENTRESTO) 97-103 MG, Take 1 tablet by mouth 2 (two) times daily., Disp: 180 tablet, Rfl: 3   spironolactone (ALDACTONE) 25 MG tablet, Take 1 tablet (25 mg total) by mouth daily., Disp: 90 tablet, Rfl: 1   vitamin B-12 (CYANOCOBALAMIN) 100 MCG tablet, Take 1,000 mcg by mouth daily., Disp: , Rfl:    Vitamin D, Ergocalciferol, (DRISDOL) 1.25 MG (50000 UNIT) CAPS capsule, TAKE 1 CAPSULE (50,000 UNITS TOTAL) BY MOUTH EVERY 7 (SEVEN) DAYS, Disp: 13 capsule, Rfl: 1  Orders Placed This Encounter  Procedures   Lipid Panel With LDL/HDL Ratio   LDL cholesterol, direct   [id number]   EKG 12-Lead   ECHOCARDIOGRAM COMPLETE    --Continue cardiac medications as reconciled in final medication list. --Return in about 6 months (around 11/10/2023) for Follow up, heart failure management.. Or sooner if needed. --Continue follow-up with your primary care physician regarding the management of your other chronic comorbid conditions.  Patient's questions and concerns were addressed to her satisfaction. She voices understanding of the instructions provided during this encounter.   This note was created using a voice recognition software as a result there may be grammatical errors inadvertently enclosed that do not reflect the nature of this encounter. Every attempt is made to correct such errors.  Tessa Lerner, [place], [facility]  Pager:  [phone] Office: [phone]

## 2023-05-14 DIAGNOSIS — S52502D Unspecified fracture of the lower end of left radius, subsequent encounter for closed fracture with routine healing: Secondary | ICD-10-CM | POA: Diagnosis not present

## 2023-05-17 ENCOUNTER — Other Ambulatory Visit: Payer: Self-pay | Admitting: Family Medicine

## 2023-05-17 ENCOUNTER — Encounter: Payer: Self-pay | Admitting: Internal Medicine

## 2023-05-17 ENCOUNTER — Ambulatory Visit (INDEPENDENT_AMBULATORY_CARE_PROVIDER_SITE_OTHER): Payer: Medicare Other | Admitting: Internal Medicine

## 2023-05-17 VITALS — BP 106/98 | HR 67 | Temp 98.5°F | Ht 60.0 in | Wt 144.0 lb

## 2023-05-17 DIAGNOSIS — E785 Hyperlipidemia, unspecified: Secondary | ICD-10-CM | POA: Diagnosis not present

## 2023-05-17 DIAGNOSIS — F331 Major depressive disorder, recurrent, moderate: Secondary | ICD-10-CM

## 2023-05-17 DIAGNOSIS — E559 Vitamin D deficiency, unspecified: Secondary | ICD-10-CM | POA: Diagnosis not present

## 2023-05-17 DIAGNOSIS — J431 Panlobular emphysema: Secondary | ICD-10-CM

## 2023-05-17 DIAGNOSIS — E1169 Type 2 diabetes mellitus with other specified complication: Secondary | ICD-10-CM

## 2023-05-17 DIAGNOSIS — E039 Hypothyroidism, unspecified: Secondary | ICD-10-CM | POA: Diagnosis not present

## 2023-05-17 DIAGNOSIS — F411 Generalized anxiety disorder: Secondary | ICD-10-CM

## 2023-05-17 DIAGNOSIS — I5042 Chronic combined systolic (congestive) and diastolic (congestive) heart failure: Secondary | ICD-10-CM | POA: Diagnosis not present

## 2023-05-17 DIAGNOSIS — E118 Type 2 diabetes mellitus with unspecified complications: Secondary | ICD-10-CM

## 2023-05-17 MED ORDER — DOXYCYCLINE HYCLATE 100 MG PO TABS
100.0000 mg | ORAL_TABLET | Freq: Two times a day (BID) | ORAL | 0 refills | Status: DC
Start: 2023-05-17 — End: 2023-09-24

## 2023-05-17 NOTE — Patient Instructions (Signed)
We have sent in doxycycline to take 1 pill twice a day for 1 week to clear the infection.

## 2023-05-17 NOTE — Progress Notes (Signed)
   Subjective:   Patient ID: Felicia Hanson, female    DOB: [birth year], 67 y.o.   MRN: [id number]  HPI The patient is a 67 YO female coming in for [facility] and having sinus symptoms and cough. Going on 2+ weeks. Some wheezing and SOB. Overall not improving. See A/P for management of chronic medical conditions.   PMH, [facility], social history reviewed and updated  Review of Systems  Constitutional:  Positive for activity change. Negative for fatigue, fever and unexpected weight change.  HENT:  Positive for congestion, postnasal drip, rhinorrhea and sinus pressure. Negative for ear discharge, ear pain, sinus pain, sneezing, sore throat, tinnitus, trouble swallowing and voice change.   Eyes: Negative.   Respiratory:  Positive for cough, shortness of breath and wheezing. Negative for chest tightness.   Cardiovascular: Negative.  Negative for chest pain, palpitations and leg swelling.  Gastrointestinal: Negative.  Negative for abdominal distention, abdominal pain, constipation, diarrhea, nausea and vomiting.  Musculoskeletal: Negative.   Skin: Negative.   Neurological: Negative.   Psychiatric/Behavioral: Negative.      Objective:  Physical Exam Constitutional:      Appearance: She is well-developed.  HENT:     Head: Normocephalic and atraumatic.     Comments: Oropharynx with redness and clear drainage, nose with swollen turbinates, TMs normal bilaterally.  Neck:     Thyroid: No thyromegaly.  Cardiovascular:     Rate and Rhythm: Normal rate and regular rhythm.  Pulmonary:     Effort: Pulmonary effort is normal. No respiratory distress.     Breath sounds: Wheezing present. No rales.  Abdominal:     General: Bowel sounds are normal. There is no distension.     Palpations: Abdomen is soft.     Tenderness: There is no abdominal tenderness. There is no rebound.  Musculoskeletal:        General: Tenderness present.     Cervical back: Normal range of motion.  Lymphadenopathy:     Cervical: No  cervical adenopathy.  Skin:    General: Skin is warm and dry.  Neurological:     Mental Status: She is alert and oriented to person, place, and time.     Coordination: Coordination normal.     Vitals:   05/17/23 1356 05/17/23 1359  BP: (!) 106/98 (!) 106/98  Pulse: 67   Temp: 98.5 F (36.9 C)   TempSrc: Oral   SpO2: 98%   Weight: 144 lb (65.3 kg) 144 lb (65.3 kg)  Height: 5' (1.524 m)     Assessment & Plan:  Visit time 25 minutes in face to face communication with patient and coordination of care, additional 15 minutes spent in record review, coordination or care, ordering tests, communicating/referring to other healthcare professionals, documenting in medical records all on the same day of the visit for total time 40 minutes spent on the visit.

## 2023-05-18 ENCOUNTER — Telehealth: Payer: Self-pay | Admitting: Family Medicine

## 2023-05-18 NOTE — Assessment & Plan Note (Signed)
Recent labs stable on levothyroxine 50 mcg daily. Continue and check every 6-12 months.

## 2023-05-18 NOTE — Assessment & Plan Note (Signed)
No flare today and she is taking farxiga and entresto and metoprolol and spironolactone dosing per cardiology. Recent labs stable and no indication for change today.

## 2023-05-18 NOTE — Assessment & Plan Note (Signed)
Taking pravastatin 80 mg daily and due for lipid panel yearly. Not due today. Continue and refill as needed.

## 2023-05-18 NOTE — Assessment & Plan Note (Signed)
Taking 16109 international units  weekly currently.

## 2023-05-18 NOTE — Assessment & Plan Note (Signed)
Seeing mental health for control and acceptable control currently on regimen.

## 2023-05-18 NOTE — Assessment & Plan Note (Signed)
With acute flare today and rx doxycycline. She will still continue arnuity and albuterol prn. Call if no better. Advised to stop smoking.

## 2023-05-18 NOTE — Assessment & Plan Note (Signed)
She is unsure if toujeo is taking although this is on her list. She is taking farxiga 10 mg daily and trulicity 0.75 mg weekly. She is on ACE-I and statin. Due for HgA1c with next labs none done today. Follow up 3-6 months.

## 2023-05-18 NOTE — Assessment & Plan Note (Signed)
Seeing mental health and some residual symptoms but acceptable control currently.

## 2023-05-18 NOTE — Telephone Encounter (Signed)
Patient transferred to Dr. Hillard Danker, [facility] on 05/17/2023.  Please remove PCP.

## 2023-05-25 ENCOUNTER — Ambulatory Visit (HOSPITAL_COMMUNITY)
Admission: RE | Admit: 2023-05-25 | Discharge: 2023-05-25 | Disposition: A | Discharge status: Home / Self Care | Payer: Medicare Other | Source: Ambulatory Visit | Attending: Cardiology | Admitting: Cardiology

## 2023-05-25 DIAGNOSIS — I5042 Chronic combined systolic (congestive) and diastolic (congestive) heart failure: Secondary | ICD-10-CM | POA: Insufficient documentation

## 2023-05-25 DIAGNOSIS — I110 Hypertensive heart disease with heart failure: Secondary | ICD-10-CM | POA: Diagnosis not present

## 2023-05-25 DIAGNOSIS — E785 Hyperlipidemia, unspecified: Secondary | ICD-10-CM | POA: Insufficient documentation

## 2023-05-25 DIAGNOSIS — F17200 Nicotine dependence, unspecified, uncomplicated: Secondary | ICD-10-CM | POA: Insufficient documentation

## 2023-05-25 DIAGNOSIS — I447 Left bundle-branch block, unspecified: Secondary | ICD-10-CM | POA: Diagnosis not present

## 2023-05-25 DIAGNOSIS — I428 Other cardiomyopathies: Secondary | ICD-10-CM | POA: Insufficient documentation

## 2023-05-25 DIAGNOSIS — E119 Type 2 diabetes mellitus without complications: Secondary | ICD-10-CM | POA: Diagnosis not present

## 2023-05-25 DIAGNOSIS — J439 Emphysema, unspecified: Secondary | ICD-10-CM | POA: Diagnosis not present

## 2023-05-26 ENCOUNTER — Encounter: Payer: Self-pay | Admitting: Pharmacist

## 2023-05-26 NOTE — Progress Notes (Addendum)
Patient previously followed by [facility] pharmacist. Patient established with new PCP. Per clinical review, no pharmacist appointment needed at this time. New PCP can refer in future if needed. Will notify pharmacy patient advocate team of medication assistance for Farxiga so that patient can be re-enrolled in 2025 if still on med. Care guide directed to contact patient and cancel appointment and notify pharmacy team of any patient concerns.

## 2023-05-28 LAB — ECHOCARDIOGRAM COMPLETE
AR max vel: 1.44 cm2
AV Area VTI: 1.55 cm2
AV Area mean vel: 1.56 cm2
AV Mean grad: 2.5 mmHg
AV Peak grad: 5.3 mmHg
Ao pk vel: 1.15 m/s
Area-P 1/2: 3.02 cm2
Calc EF: 45.3 %
S' Lateral: 2.9 cm
Single Plane A2C EF: 46.1 %
Single Plane A4C EF: 43.8 %

## 2023-06-02 ENCOUNTER — Encounter: Payer: Medicare Other | Admitting: Pharmacist

## 2023-06-04 NOTE — Progress Notes (Signed)
Gave results. Patient verbalized understanding.

## 2023-06-08 ENCOUNTER — Ambulatory Visit: Payer: Medicare Other | Admitting: Behavioral Health

## 2023-06-08 NOTE — Progress Notes (Unsigned)
                Tawna Alwin L Huong Luthi, LMFT 
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
## 2023-06-16 ENCOUNTER — Other Ambulatory Visit: Payer: Self-pay | Admitting: Family Medicine

## 2023-06-16 DIAGNOSIS — E1165 Type 2 diabetes mellitus with hyperglycemia: Secondary | ICD-10-CM

## 2023-06-17 DIAGNOSIS — S022XXD Fracture of nasal bones, subsequent encounter for fracture with routine healing: Secondary | ICD-10-CM | POA: Diagnosis not present

## 2023-06-18 ENCOUNTER — Telehealth: Payer: Self-pay | Admitting: Internal Medicine

## 2023-06-18 NOTE — Telephone Encounter (Signed)
Pt's husband dropped FL2 forms to be filled out and they have been placed in the provider's box.   Upon completion please fax to: [phone]

## 2023-06-23 ENCOUNTER — Ambulatory Visit: Payer: Medicare Other | Admitting: Neurology

## 2023-06-23 ENCOUNTER — Encounter: Payer: Self-pay | Admitting: Neurology

## 2023-06-23 VITALS — BP 116/71 | HR 81 | Ht 60.0 in | Wt 145.8 lb

## 2023-06-23 DIAGNOSIS — R410 Disorientation, unspecified: Secondary | ICD-10-CM | POA: Diagnosis not present

## 2023-06-23 DIAGNOSIS — R209 Unspecified disturbances of skin sensation: Secondary | ICD-10-CM

## 2023-06-23 DIAGNOSIS — F341 Dysthymic disorder: Secondary | ICD-10-CM

## 2023-06-23 DIAGNOSIS — G4733 Obstructive sleep apnea (adult) (pediatric): Secondary | ICD-10-CM | POA: Diagnosis not present

## 2023-06-23 DIAGNOSIS — G250 Essential tremor: Secondary | ICD-10-CM | POA: Diagnosis not present

## 2023-06-23 DIAGNOSIS — F0781 Postconcussional syndrome: Secondary | ICD-10-CM

## 2023-06-23 MED ORDER — TOPIRAMATE 50 MG PO TABS
50.0000 mg | ORAL_TABLET | Freq: Two times a day (BID) | ORAL | 3 refills | Status: DC
Start: 2023-06-23 — End: 2024-07-24

## 2023-06-23 NOTE — Patient Instructions (Signed)
Continue topamax 50 mg twice daily for tremors.  Please call with new or worsening symptoms. Follow up as needed.  The physicians and staff at [facility] are committed to providing excellent care. You may receive a survey requesting feedback about your experience at our office. We strive to receive "very good" responses to the survey questions. If you feel that your experience would prevent you from giving the office a "very good " response, please contact our office to try to remedy the situation. We may be reached at [phone]. Thank you for taking the time out of your busy day to complete the survey.  Jacquelyne Balint, MD [facility]

## 2023-07-08 ENCOUNTER — Other Ambulatory Visit: Payer: Self-pay | Admitting: Family Medicine

## 2023-07-08 ENCOUNTER — Other Ambulatory Visit: Payer: Self-pay | Admitting: Cardiology

## 2023-07-08 ENCOUNTER — Ambulatory Visit: Payer: Medicare Other | Admitting: Behavioral Health

## 2023-07-08 DIAGNOSIS — E1129 Type 2 diabetes mellitus with other diabetic kidney complication: Secondary | ICD-10-CM

## 2023-07-08 DIAGNOSIS — I5022 Chronic systolic (congestive) heart failure: Secondary | ICD-10-CM

## 2023-07-08 NOTE — Progress Notes (Unsigned)
                 L , LMFT 

## 2023-07-08 NOTE — Telephone Encounter (Signed)
Please call patient back

## 2023-07-12 NOTE — Telephone Encounter (Signed)
Please call patient - [phone]

## 2023-07-14 ENCOUNTER — Telehealth: Payer: Self-pay | Admitting: Internal Medicine

## 2023-07-14 NOTE — Telephone Encounter (Signed)
Placed inside providers office box

## 2023-07-14 NOTE — Telephone Encounter (Signed)
Patient is completely out of the medication. It was originally prescribed by her previous PCP.   Prescription Request  07/14/2023  LOV: 05/17/2023  What is the name of the medication or equipment? Dulaglutide (TRULICITY) 0.75 MG/0.5ML SOPN   Have you contacted your pharmacy to request a refill? Yes   Which pharmacy would you like this sent to?  [id number] [place], [place] - [address] [place] [address] Phone: [phone] Fax: [phone]    Patient notified that their request is being sent to the clinical staff for review and that they should receive a response within 2 business days.   Please advise at Mobile [phone] (mobile)

## 2023-07-15 MED ORDER — TRULICITY 0.75 MG/0.5ML ~~LOC~~ SOAJ: 0.7500 mg | SUBCUTANEOUS | 2 refills | Status: DC

## 2023-07-15 NOTE — Telephone Encounter (Signed)
Notified pt MD sent rx to [facility]../lmb 

## 2023-07-15 NOTE — Telephone Encounter (Signed)
Likely will need visit for FL2 since I have only seen once and am unsure what level of care is appropriate for her.

## 2023-07-15 NOTE — Telephone Encounter (Signed)
Called pt inform MD response. Pt made appt for 07/23/23../l;mb

## 2023-07-15 NOTE — Telephone Encounter (Signed)
Sent in

## 2023-07-18 ENCOUNTER — Other Ambulatory Visit: Payer: Self-pay | Admitting: Family Medicine

## 2023-07-18 DIAGNOSIS — E1129 Type 2 diabetes mellitus with other diabetic kidney complication: Secondary | ICD-10-CM

## 2023-07-23 ENCOUNTER — Ambulatory Visit: Payer: Medicare Other | Admitting: Internal Medicine

## 2023-07-23 ENCOUNTER — Encounter: Payer: Self-pay | Admitting: Internal Medicine

## 2023-07-23 NOTE — Progress Notes (Signed)
Error

## 2023-07-27 ENCOUNTER — Other Ambulatory Visit: Payer: Self-pay | Admitting: Family Medicine

## 2023-07-27 ENCOUNTER — Other Ambulatory Visit: Payer: Self-pay | Admitting: Internal Medicine

## 2023-07-27 DIAGNOSIS — R7989 Other specified abnormal findings of blood chemistry: Secondary | ICD-10-CM

## 2023-07-27 DIAGNOSIS — E034 Atrophy of thyroid (acquired): Secondary | ICD-10-CM

## 2023-07-27 NOTE — Telephone Encounter (Signed)
Spoke to patient, states she is no longer seeing Dr Chilton Si. She is under care of Dr. Okey Dupre.

## 2023-07-29 ENCOUNTER — Telehealth: Payer: Self-pay | Admitting: Internal Medicine

## 2023-07-29 ENCOUNTER — Other Ambulatory Visit: Payer: Self-pay | Admitting: Family Medicine

## 2023-07-29 ENCOUNTER — Other Ambulatory Visit: Payer: Self-pay | Admitting: Internal Medicine

## 2023-07-29 ENCOUNTER — Other Ambulatory Visit: Payer: Self-pay

## 2023-07-29 DIAGNOSIS — E1165 Type 2 diabetes mellitus with hyperglycemia: Secondary | ICD-10-CM

## 2023-07-29 MED ORDER — TOUJEO SOLOSTAR 300 UNIT/ML ~~LOC~~ SOPN: 50.0000 [IU] | PEN_INJECTOR | Freq: Every evening | SUBCUTANEOUS | 3 refills | Status: DC

## 2023-07-29 NOTE — Telephone Encounter (Signed)
Sent in

## 2023-07-29 NOTE — Telephone Encounter (Signed)
Prescription Request  07/29/2023  LOV: 07/23/2023  What is the name of the medication or equipment? TOUJEO SOLOSTAR 300 UNIT/ML Solostar Pen   Have you contacted your pharmacy to request a refill? No   Which pharmacy would you like this sent to?     [id number] [place], [place] - [address] [place] [address] Phone: [phone] Fax: [phone]  Patient notified that their request is being sent to the clinical staff for review and that they should receive a response within 2 business days.   Please advise at Mobile [phone] (mobile)

## 2023-07-29 NOTE — Telephone Encounter (Signed)
Pt called back stating she is completely out of  her insulin.

## 2023-08-04 ENCOUNTER — Telehealth: Payer: Self-pay | Admitting: Internal Medicine

## 2023-08-04 NOTE — Telephone Encounter (Signed)
Patient is out of her trulicity - patient states Dr. Okey Dupre - [facility] - [phone] - They have told patient that Dr. Okey Dupre would need to call them.  Patient also wants to thank Dr. Okey Dupre for getting her trujeo shots straightened out.

## 2023-08-06 ENCOUNTER — Other Ambulatory Visit: Payer: Self-pay

## 2023-08-06 MED ORDER — TRULICITY 0.75 MG/0.5ML ~~LOC~~ SOAJ: 0.7500 mg | SUBCUTANEOUS | 2 refills | Status: DC

## 2023-08-06 NOTE — Telephone Encounter (Signed)
Refill has been sent in.  

## 2023-08-17 ENCOUNTER — Ambulatory Visit: Payer: Medicare Other | Admitting: Emergency Medicine

## 2023-08-23 ENCOUNTER — Ambulatory Visit: Payer: Medicare Other | Admitting: Behavioral Health

## 2023-08-24 ENCOUNTER — Other Ambulatory Visit: Payer: Self-pay | Admitting: Cardiology

## 2023-08-24 ENCOUNTER — Other Ambulatory Visit: Payer: Self-pay | Admitting: Family Medicine

## 2023-08-24 DIAGNOSIS — E034 Atrophy of thyroid (acquired): Secondary | ICD-10-CM

## 2023-08-24 DIAGNOSIS — E781 Pure hyperglyceridemia: Secondary | ICD-10-CM

## 2023-08-24 DIAGNOSIS — E1165 Type 2 diabetes mellitus with hyperglycemia: Secondary | ICD-10-CM

## 2023-08-24 DIAGNOSIS — I10 Essential (primary) hypertension: Secondary | ICD-10-CM

## 2023-08-24 DIAGNOSIS — Z794 Long term (current) use of insulin: Secondary | ICD-10-CM

## 2023-08-31 ENCOUNTER — Ambulatory Visit: Payer: Medicare Other | Admitting: Behavioral Health

## 2023-08-31 DIAGNOSIS — F411 Generalized anxiety disorder: Secondary | ICD-10-CM | POA: Diagnosis not present

## 2023-08-31 DIAGNOSIS — F331 Major depressive disorder, recurrent, moderate: Secondary | ICD-10-CM | POA: Diagnosis not present
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
## 2023-08-31 NOTE — Progress Notes (Signed)
                Anastasya Jewell L Farryn Linares, LMFT 

## 2023-09-16 ENCOUNTER — Ambulatory Visit: Payer: Medicare Other | Admitting: Internal Medicine

## 2023-09-20 ENCOUNTER — Ambulatory Visit: Payer: Medicare Other | Admitting: Behavioral Health

## 2023-09-20 DIAGNOSIS — F411 Generalized anxiety disorder: Secondary | ICD-10-CM

## 2023-09-20 DIAGNOSIS — F331 Major depressive disorder, recurrent, moderate: Secondary | ICD-10-CM | POA: Diagnosis not present
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
## 2023-09-20 NOTE — Progress Notes (Signed)
                Felicia Hanson Felicia Farryn Linares, LMFT 

## 2023-09-24 ENCOUNTER — Ambulatory Visit
Admission: EM | Admit: 2023-09-24 | Discharge: 2023-09-24 | Disposition: A | Discharge status: Home / Self Care | Payer: Medicare Other

## 2023-09-24 ENCOUNTER — Ambulatory Visit: Payer: Medicare Other

## 2023-09-24 DIAGNOSIS — Z2089 Contact with and (suspected) exposure to other communicable diseases: Secondary | ICD-10-CM | POA: Diagnosis not present

## 2023-09-24 DIAGNOSIS — J069 Acute upper respiratory infection, unspecified: Secondary | ICD-10-CM | POA: Diagnosis not present

## 2023-09-24 DIAGNOSIS — R059 Cough, unspecified: Secondary | ICD-10-CM

## 2023-09-24 MED ORDER — AZITHROMYCIN 250 MG PO TABS: 250.0000 mg | ORAL_TABLET | Freq: Every day | ORAL | 0 refills | Status: DC

## 2023-09-24 MED ORDER — AMOXICILLIN 500 MG PO CAPS: 500.0000 mg | ORAL_CAPSULE | Freq: Three times a day (TID) | ORAL | 0 refills | Status: DC

## 2023-09-24 NOTE — ED Triage Notes (Signed)
"  I have my grandchildren a lot and recently they have had pneumonia and other viral things". "I am now having cough/congestion for a little over a week now". No fever. Sob?Marland Kitchen No wheezing or respiratory distress. No fever known.

## 2023-10-07 ENCOUNTER — Ambulatory Visit: Payer: Medicare Other | Admitting: Behavioral Health

## 2023-10-07 DIAGNOSIS — F411 Generalized anxiety disorder: Secondary | ICD-10-CM

## 2023-10-07 DIAGNOSIS — F331 Major depressive disorder, recurrent, moderate: Secondary | ICD-10-CM
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
## 2023-10-08 NOTE — Progress Notes (Signed)
                Anastasya Jewell L Farryn Linares, LMFT 

## 2023-10-13 ENCOUNTER — Other Ambulatory Visit: Payer: Self-pay | Admitting: Family Medicine

## 2023-10-13 DIAGNOSIS — I10 Essential (primary) hypertension: Secondary | ICD-10-CM

## 2023-10-13 DIAGNOSIS — E1129 Type 2 diabetes mellitus with other diabetic kidney complication: Secondary | ICD-10-CM

## 2023-10-13 DIAGNOSIS — E1165 Type 2 diabetes mellitus with hyperglycemia: Secondary | ICD-10-CM

## 2023-10-13 DIAGNOSIS — E661 Drug-induced obesity: Secondary | ICD-10-CM

## 2023-10-13 DIAGNOSIS — R7989 Other specified abnormal findings of blood chemistry: Secondary | ICD-10-CM

## 2023-10-13 DIAGNOSIS — E034 Atrophy of thyroid (acquired): Secondary | ICD-10-CM

## 2023-10-16 ENCOUNTER — Other Ambulatory Visit: Payer: Self-pay | Admitting: Internal Medicine

## 2023-10-23 NOTE — ED Provider Notes (Signed)
[facility]    CSN: [id number] Arrival date & time: 09/24/23  1355      History   Chief Complaint Chief Complaint  Patient presents with   Cough    HPI Felicia Hanson is a 67 y.o. female.   Patient here today for evaluation of cough and congestion that she has had for over a week.  She reports that she is around her grandchildren frequently and they recently were diagnosed with pneumonia as well as other viral things.  She has not any fever.  She reports questionable shortness of breath at times.  She does deny any wheezing or respiratory distress.  She has not had any vomiting or diarrhea.  She has taken over-the-counter medication without resolution.  The history is provided by the patient.  Cough Associated symptoms: no chills, no ear pain, no eye discharge, no fever, no shortness of breath, no sore throat and no wheezing     Past Medical History:  Diagnosis Date   Allergy    Anxiety    CHF (congestive heart failure) (HCC) 01/20/2021   COPD (chronic obstructive pulmonary disease) (HCC)    Coronary artery calcification    Depression    Diabetes mellitus without complication (HCC)    Diverticulitis    with colon resection   GERD (gastroesophageal reflux disease)    on pepcid   Hyperlipidemia    Hypertension    Sleep apnea    cpap on order 01-01-22   Sleep paralysis    will be tested for narcolepsy per pt 01-01-22   Thyroid disease     Patient Active Problem List   Diagnosis Date Noted   MDD (major depressive disorder), recurrent episode (HCC) 03/03/2023   Benign essential tremor 05/25/2022   Loud snoring 03/24/2021   Excessive daytime sleepiness 03/24/2021   Panlobular emphysema (HCC) 03/24/2021   GAD (generalized anxiety disorder) 03/24/2021   Chronic combined systolic and diastolic CHF (congestive heart failure) (HCC) 01/20/2021   Type 2 diabetes with complication (HCC) 10/26/2017   Vitamin D deficiency 01/10/2014   Essential hypertension,  benign 01/10/2014   Hyperlipidemia associated with type 2 diabetes mellitus (HCC) 01/10/2014   Hypothyroidism 01/10/2014    Past Surgical History:  Procedure Laterality Date   BREAST EXCISIONAL BIOPSY Left    CESAREAN SECTION     x2   CHOLECYSTECTOMY     COLON SURGERY     diverticulitis with perforation; s/p colon resection.   Colonoscopy     COLONOSCOPY     DENTAL SURGERY     RIGHT/LEFT HEART CATH AND CORONARY ANGIOGRAPHY N/A 01/22/2021   Procedure: RIGHT/LEFT HEART CATH AND CORONARY ANGIOGRAPHY;  Surgeon: Yates Decamp, MD;  Location: [facility];  Service: Cardiovascular;  Laterality: N/A;   TUBAL LIGATION      OB History   No obstetric history on file.      Home Medications    Prior to Admission medications   Medication Sig Start Date End Date Taking? Authorizing Provider  amoxicillin (AMOXIL) 500 MG capsule Take 1 capsule (500 mg total) by mouth 3 (three) times daily. 09/24/23  Yes Tomi Bamberger, PA-C  azithromycin (ZITHROMAX) 250 MG tablet Take 1 tablet (250 mg total) by mouth daily. Take first 2 tablets together, then 1 every day until finished. 09/24/23  Yes Tomi Bamberger, PA-C  ACCU-CHEK AVIVA PLUS test strip daily. for testing 03/11/18   [provider]  albuterol (VENTOLIN HFA) 108 (90 Base) MCG/ACT inhaler Inhale 1 puff  into the lungs every 6 (six) hours as needed for wheezing or shortness of breath.    [provider]  ALPRAZolam Prudy Feeler) 1 MG tablet Take 0.5 mg by mouth 3 (three) times daily as needed for anxiety or sleep. 08/21/13   [provider]  aspirin 81 MG tablet Take 81 mg by mouth daily.    [provider]  Azelastine HCl 137 MCG/SPRAY SOLN PLACE 1 SPRAY INTO BOTH NOSTRILS 2 (TWO) TIMES DAILY. USE IN EACH NOSTRIL AS DIRECTED IF RUNNY NOSE/DRAINAGE. 05/26/22   Shade Flood, MD  BLACK ELDERBERRY PO Take 10 mLs by mouth daily.    [provider]  cetirizine (ZYRTEC) 10 MG tablet Take 10 mg by mouth  daily.    [provider]  Continuous Blood Gluc Receiver (DEXCOM G6 RECEIVER) DEVI 1 Device by Does not apply route every morning. Device and supplies 04/18/20   Shade Flood, MD  dapagliflozin propanediol (FARXIGA) 10 MG TABS tablet Take 1 tablet (10 mg total) by mouth daily. 05/04/23   Tolia, Sunit, DO  DULoxetine (CYMBALTA) 60 MG capsule TAKE 2 CAPSULES BY MOUTH DAILY 02/17/23   Shade Flood, MD  ezetimibe (ZETIA) 10 MG tablet TAKE 1 TABLET BY MOUTH EVERY DAY 04/02/23   Shade Flood, MD  famotidine (PEPCID) 10 MG tablet Take 10 mg by mouth daily.    [provider]  fenofibrate (TRICOR) 145 MG tablet TAKE 1 TABLET BY MOUTH EVERY DAY 08/24/23   Tolia, Sunit, DO  fluticasone (FLONASE) 50 MCG/ACT nasal spray Place 2 sprays into both nostrils daily. 03/15/22   Leath-Warren, Sadie Haber, NP  Fluticasone Furoate (ARNUITY ELLIPTA) 100 MCG/ACT AEPB Inhale 1 Inhalation into the lungs daily. 02/17/23   Shade Flood, MD  Insulin Pen Needle 31G X 5 MM MISC Inject 1 each into the skin daily. 02/10/21   Shade Flood, MD  ipratropium (ATROVENT) 0.03 % nasal spray Place 2 sprays into both nostrils 3 (three) times daily as needed for rhinitis. 04/23/23   Bing Neighbors, NP  levothyroxine (SYNTHROID) 50 MCG tablet TAKE 1 TABLET BY MOUTH EVERY DAY 02/16/23   Shade Flood, MD  magnesium oxide (MAG-OX) 400 MG tablet TAKE 0.5 TABLETS (200 MG TOTAL) BY MOUTH 2 TIMES DAILY. 02/17/23   Tolia, Sunit, DO  metoprolol succinate (TOPROL-XL) 100 MG 24 hr tablet TAKE 1 TABLET BY MOUTH EVERY DAY WITH OR IMMEDIATELY FOLLOWING A MEAL 07/08/23   Tolia, Sunit, DO  nitroGLYCERIN (NITROSTAT) 0.4 MG SL tablet PLACE 1 TABLET UNDER THE TONGUE EVERY 5 MINUTES AS NEEDED FOR CHEST PAIN. IF YOU REQUIRE MORE THAN TWO TABLETS FIVE MINUTES APART GO TO THE NEAREST ER VIA EMS. Patient taking differently: Place 0.4 mg under the tongue every 5 (five) minutes as needed for chest pain. 07/29/20   Tolia, Sunit, DO   omeprazole (PRILOSEC) 20 MG capsule TAKE 1 CAPSULE BY MOUTH EVERY DAY 01/11/23   Shade Flood, MD  pravastatin (PRAVACHOL) 80 MG tablet Take 1 tablet (80 mg total) by mouth daily. TAKE 1 TABLET BY MOUTH EVERY DAY IN THE EVENING 10/28/22   Shade Flood, MD  sacubitril-valsartan (ENTRESTO) 97-103 MG Take 1 tablet by mouth 2 (two) times daily. 12/03/22   Tolia, Sunit, DO  spironolactone (ALDACTONE) 25 MG tablet Take 1 tablet (25 mg total) by mouth daily. 02/17/23   Shade Flood, MD  topiramate (TOPAMAX) 50 MG tablet Take 1 tablet (50 mg total) by mouth 2 (two) times daily.  06/23/23   Antony Madura, MD  TOUJEO SOLOSTAR 300 UNIT/ML Solostar Pen Inject 50 Units into the skin Nightly. 07/29/23   Myrlene Broker, MD  TRULICITY 0.75 MG/0.5ML SOAJ INJECT 0.75MG  (0.5ML) UNDER THE SKIN ONCE A WEEK. 10/18/23   Myrlene Broker, MD  TRULICITY 1.5 MG/0.5ML SOAJ Inject into the skin. 06/16/23   [provider]  vitamin B-12 (CYANOCOBALAMIN) 100 MCG tablet Take 1,000 mcg by mouth daily.    [provider]  Vitamin D, Ergocalciferol, (DRISDOL) 1.25 MG (50000 UNIT) CAPS capsule TAKE 1 CAPSULE (50,000 UNITS TOTAL) BY MOUTH EVERY 7 (SEVEN) DAYS 04/02/23   Shade Flood, MD    Family History Family History  Problem Relation Age of Onset   Colon polyps Mother    Cancer Mother        unknown primary   Hyperlipidemia Mother    Other Father    Breast cancer Paternal Aunt    Esophageal cancer Paternal Uncle    Colon cancer Neg Hx    Rectal cancer Neg Hx    Stomach cancer Neg Hx     Social History Social History   Tobacco Use   Smoking status: Every Day    Current packs/day: 0.25    Average packs/day: 0.3 packs/day for 38.0 years (9.5 ttl pk-yrs)    Types: Cigarettes   Smokeless tobacco: Never   Tobacco comments:    2-5 A DAY  Vaping Use   Vaping status: Never Used  Substance Use Topics   Alcohol use: Not Currently   Drug use: Never     Allergies    Metformin and related, Nicotine, and Ace inhibitors   Review of Systems Review of Systems  Constitutional:  Negative for chills and fever.  HENT:  Positive for congestion. Negative for ear pain and sore throat.   Eyes:  Negative for discharge and redness.  Respiratory:  Positive for cough. Negative for shortness of breath and wheezing.   Gastrointestinal:  Negative for abdominal pain, diarrhea, nausea and vomiting.     Physical Exam Triage Vital Signs ED Triage Vitals  Encounter Vitals Group     BP 09/24/23 1405 (!) 143/79     Systolic BP Percentile --      Diastolic BP Percentile --      Pulse Rate 09/24/23 1405 74     Resp 09/24/23 1405 20     Temp 09/24/23 1405 98.6 F (37 C)     Temp Source 09/24/23 1405 Oral     SpO2 09/24/23 1405 98 %     Weight 09/24/23 1410 140 lb (63.5 kg)     Height 09/24/23 1410 5' (1.524 m)     Head Circumference --      Peak Flow --      Pain Score 09/24/23 1407 0     Pain Loc --      Pain Education --      Exclude from Growth Chart --    No data found.  Updated Vital Signs BP 120/71 (BP Location: Left Arm)   Pulse 74   Temp 98.6 F (37 C) (Oral)   Resp 20   Ht 5' (1.524 m)   Wt 140 lb (63.5 kg)   SpO2 98%   BMI 27.34 kg/m   Visual Acuity Right Eye Distance:   Left Eye Distance:   Bilateral Distance:    Right Eye Near:   Left Eye Near:    Bilateral Near:     Physical Exam  Vitals and nursing note reviewed.  Constitutional:      General: She is not in acute distress.    Appearance: Normal appearance. She is not ill-appearing.  HENT:     Head: Normocephalic and atraumatic.     Nose: Congestion present.     Mouth/Throat:     Mouth: Mucous membranes are moist.     Pharynx: No oropharyngeal exudate or posterior oropharyngeal erythema.  Eyes:     Conjunctiva/sclera: Conjunctivae normal.  Cardiovascular:     Rate and Rhythm: Normal rate and regular rhythm.     Heart sounds: Normal heart sounds. No murmur  heard. Pulmonary:     Effort: Pulmonary effort is normal. No respiratory distress.     Breath sounds: Normal breath sounds. No wheezing, rhonchi or rales.  Skin:    General: Skin is warm and dry.  Neurological:     Mental Status: She is alert.  Psychiatric:        Mood and Affect: Mood normal.        Thought Content: Thought content normal.      UC Treatments / Results  Labs (all labs ordered are listed, but only abnormal results are displayed) Labs Reviewed - No data to display  EKG   Radiology No results found.  Procedures Procedures (including critical care time)  Medications Ordered in UC Medications - No data to display  Initial Impression / Assessment and Plan / UC Course  I have reviewed the triage vital signs and the nursing notes.  Pertinent labs & imaging results that were available during my care of the patient were reviewed by me and considered in my medical decision making (see chart for details).    Chest x-ray within normal limits, given exposure to pneumonia and consistent cough will treat with amoxicillin and Z-Pak.  Encouraged follow-up if no gradual improvement with any further concerns.  Final Clinical Impressions(s) / UC Diagnoses   Final diagnoses:  Exposure to pneumonia  Acute upper respiratory infection   Discharge Instructions   None    ED Prescriptions     Medication Sig Dispense Auth. Provider   amoxicillin (AMOXIL) 500 MG capsule Take 1 capsule (500 mg total) by mouth 3 (three) times daily. 21 capsule Erma Pinto F, PA-C   azithromycin (ZITHROMAX) 250 MG tablet Take 1 tablet (250 mg total) by mouth daily. Take first 2 tablets together, then 1 every day until finished. 6 tablet Tomi Bamberger, PA-C      PDMP not reviewed this encounter.   Tomi Bamberger, PA-C 10/23/23 0730

## 2023-10-25 ENCOUNTER — Other Ambulatory Visit: Payer: Self-pay | Admitting: Internal Medicine

## 2023-11-02 ENCOUNTER — Ambulatory Visit: Payer: Medicare Other | Admitting: Internal Medicine

## 2023-11-03 ENCOUNTER — Ambulatory Visit: Payer: Medicare Other | Admitting: Behavioral Health

## 2023-11-03 ENCOUNTER — Telehealth: Payer: Self-pay | Admitting: Cardiology

## 2023-11-03 NOTE — Telephone Encounter (Signed)
Patient dropped off prescription assistance paperwork for Provider's review and approval.    I am placing this paperwork off in Dr. Emelda Brothers mail box.  Thank you.

## 2023-11-04 NOTE — Telephone Encounter (Signed)
Felicia Hanson, We can take care of this on Monday.  Felicia Geidel Canby, DO, [facility]

## 2023-11-05 ENCOUNTER — Ambulatory Visit: Payer: Medicare Other | Admitting: Internal Medicine

## 2023-11-09 ENCOUNTER — Other Ambulatory Visit: Payer: Self-pay | Admitting: Internal Medicine

## 2023-11-09 ENCOUNTER — Ambulatory Visit: Payer: Self-pay | Admitting: Cardiology

## 2023-11-09 DIAGNOSIS — R7989 Other specified abnormal findings of blood chemistry: Secondary | ICD-10-CM

## 2023-11-12 ENCOUNTER — Ambulatory Visit: Payer: Medicare Other | Admitting: Internal Medicine

## 2023-11-15 ENCOUNTER — Encounter: Payer: Self-pay | Admitting: Internal Medicine

## 2023-11-15 ENCOUNTER — Ambulatory Visit (INDEPENDENT_AMBULATORY_CARE_PROVIDER_SITE_OTHER): Payer: Medicare Other | Admitting: Internal Medicine

## 2023-11-15 VITALS — BP 146/98 | HR 89 | Temp 98.3°F | Ht 60.0 in | Wt 153.0 lb

## 2023-11-15 DIAGNOSIS — Z0000 Encounter for general adult medical examination without abnormal findings: Secondary | ICD-10-CM

## 2023-11-15 DIAGNOSIS — E1169 Type 2 diabetes mellitus with other specified complication: Secondary | ICD-10-CM

## 2023-11-15 DIAGNOSIS — Z7985 Long-term (current) use of injectable non-insulin antidiabetic drugs: Secondary | ICD-10-CM

## 2023-11-15 DIAGNOSIS — E785 Hyperlipidemia, unspecified: Secondary | ICD-10-CM

## 2023-11-15 DIAGNOSIS — R7989 Other specified abnormal findings of blood chemistry: Secondary | ICD-10-CM | POA: Diagnosis not present

## 2023-11-15 DIAGNOSIS — E118 Type 2 diabetes mellitus with unspecified complications: Secondary | ICD-10-CM | POA: Diagnosis not present

## 2023-11-15 DIAGNOSIS — E039 Hypothyroidism, unspecified: Secondary | ICD-10-CM

## 2023-11-15 DIAGNOSIS — L989 Disorder of the skin and subcutaneous tissue, unspecified: Secondary | ICD-10-CM

## 2023-11-15 DIAGNOSIS — I10 Essential (primary) hypertension: Secondary | ICD-10-CM | POA: Diagnosis not present

## 2023-11-15 DIAGNOSIS — Z7984 Long term (current) use of oral hypoglycemic drugs: Secondary | ICD-10-CM | POA: Diagnosis not present

## 2023-11-15 LAB — LIPID PANEL
Cholesterol: 136 mg/dL (ref 0–200)
HDL: 45.5 mg/dL (ref >39.00)
LDL Cholesterol: 68 mg/dL (ref 0–99)
NonHDL: 90.15
Total CHOL/HDL Ratio: 3
Triglycerides: 112 mg/dL (ref 0.0–149.0)
VLDL: 22.4 mg/dL (ref 0.0–40.0)

## 2023-11-15 LAB — COMPREHENSIVE METABOLIC PANEL
ALT: 10 U/L (ref 0–35)
AST: 20 U/L (ref 0–37)
Albumin: 3.7 g/dL (ref 3.5–5.2)
Alkaline Phosphatase: 68 U/L (ref 39–117)
BUN: 12 mg/dL (ref 6–23)
CO2: 25 meq/L (ref 19–32)
Calcium: 9.4 mg/dL (ref 8.4–10.5)
Chloride: 108 meq/L (ref 96–112)
Creatinine, Ser: 1.28 mg/dL — ABNORMAL HIGH (ref 0.40–1.20)
GFR: 42.92 mL/min — ABNORMAL LOW (ref >60.00)
Glucose, Bld: 74 mg/dL (ref 70–99)
Potassium: 4.5 meq/L (ref 3.5–5.1)
Sodium: 140 meq/L (ref 135–145)
Total Bilirubin: 0.5 mg/dL (ref 0.2–1.2)
Total Protein: 7 g/dL (ref 6.0–8.3)

## 2023-11-15 LAB — CBC
HCT: 43.2 % (ref 36.0–46.0)
Hemoglobin: 14.3 g/dL (ref 12.0–15.0)
MCHC: 33 g/dL (ref 30.0–36.0)
MCV: 91.5 fL (ref 78.0–100.0)
Platelets: 236 10*3/uL (ref 150.0–400.0)
RBC: 4.72 Mil/uL (ref 3.87–5.11)
RDW: 13.5 % (ref 11.5–15.5)
WBC: 9.1 10*3/uL (ref 4.0–10.5)

## 2023-11-15 LAB — MICROALBUMIN / CREATININE URINE RATIO
Creatinine,U: 83.1 mg/dL
Microalb Creat Ratio: 1.6 mg/g (ref 0.0–30.0)
Microalb, Ur: 1.3 mg/dL (ref 0.0–1.9)

## 2023-11-15 LAB — HEMOGLOBIN A1C: Hgb A1c MFr Bld: 5.4 % (ref 4.6–6.5)

## 2023-11-15 MED ORDER — VITAMIN D (ERGOCALCIFEROL) 1.25 MG (50000 UNIT) PO CAPS: 50000.0000 [IU] | ORAL_CAPSULE | ORAL | 3 refills | Status: DC

## 2023-11-15 NOTE — Progress Notes (Unsigned)
   Subjective:   Patient ID: Felicia Hanson, female    DOB: 13-Jul-1956, 67 y.o.   MRN: [id number]  HPI The patient is here for physical.  PMH, [facility], social history reviewed and updated  Review of Systems  Objective:  Physical Exam  There were no vitals filed for this visit.  Assessment & Plan:

## 2023-11-15 NOTE — Patient Instructions (Signed)
We will check the labs today. We will get you in with the dermatologist and the foot doctor.

## 2023-11-16 NOTE — Telephone Encounter (Signed)
Spoke with husband per DPR and he is aware Dr.Tolia nurse is out but I will send her the message to see if assistance form has been completed

## 2023-11-16 NOTE — Telephone Encounter (Signed)
Patient's husband is requesting call back to get update for patient assistance.

## 2023-11-17 ENCOUNTER — Telehealth: Payer: Self-pay

## 2023-11-17 NOTE — Telephone Encounter (Addendum)
PAP: Application for Sherryll Burger has been submitted to PAP Companies: [employer], via fax. Application was faxed to company on 12/11. If patient requests an update in the meantime, please refer them to [employer] at [phone].

## 2023-11-17 NOTE — Telephone Encounter (Signed)
Spoke with pt and updated her on her PA forms. Forms were faxed over to [employer] on 12/11 per Haze Rushing, [employer] and turnaround time is running approximately 2 weeks. Pt verbalized understanding and stated she would call back with any questions she may have.

## 2023-11-18 DIAGNOSIS — Z0000 Encounter for general adult medical examination without abnormal findings: Secondary | ICD-10-CM | POA: Insufficient documentation

## 2023-11-18 DIAGNOSIS — L989 Disorder of the skin and subcutaneous tissue, unspecified: Secondary | ICD-10-CM | POA: Insufficient documentation

## 2023-11-18 NOTE — Assessment & Plan Note (Signed)
Flu shot up to date for season. Pneumonia declines. Shingrix declines. Tetanus up to date. Colonoscopy up to date. Mammogram declines, pap smear aged out and dexa complete. Counseled about sun safety and mole surveillance. Counseled about the dangers of distracted driving. Given 10 year screening recommendations.

## 2023-11-18 NOTE — Assessment & Plan Note (Signed)
Checking lipid panel and adjust pravastatin 80 mg daily as needed and zetia.

## 2023-11-18 NOTE — Assessment & Plan Note (Addendum)
Needs referral to podiatry for ongoing care. Checking microalbumin to creatinine ratio and lipid panel and hga1 and adjust as needed taking farxiga 10 mg daily and trulicity 0.75 mg weekly. On ACE-I and statin.

## 2023-11-18 NOTE — Assessment & Plan Note (Signed)
Checking TSH and adjust levothyroxine 50 mcg daily as needed.

## 2023-11-18 NOTE — Assessment & Plan Note (Signed)
Multiple lesions on face and some look atypical. Referral to dermatology for assessment and treatment.

## 2023-11-23 ENCOUNTER — Ambulatory Visit: Payer: Medicare Other | Admitting: Behavioral Health

## 2023-11-25 ENCOUNTER — Ambulatory Visit: Payer: Medicare Other | Admitting: Podiatry

## 2023-11-25 NOTE — Telephone Encounter (Signed)
Follow Up:     Patient's husband is calling to check on the status of the paperwork? He said they said they have not received the paperwork. Please fax to [phone].;

## 2023-11-26 NOTE — Telephone Encounter (Signed)
It's just the provider form for Entrest[employer] right? I can refax it but I'm not sure where that fax number patient sent is going to. The number they provided is not the fax number for [employer]. Do they need [place] to send it somewhere else?

## 2023-11-26 NOTE — Telephone Encounter (Signed)
Thanks for the update [facility]! I appreciate you clarifying that.

## 2023-11-26 NOTE — Telephone Encounter (Signed)
Spoke with pt about PAP. Explained that per Haze Rushing, [employer], the fax number given to [place] was not from [employer]. Pt stated that the fax number was from [facility] in [place], [place]. Explained to pt that the PA forms need to be sent to [employer] to be looked at and that they had previously been faxed by our office but we will recheck their status. Pt verbalized understanding and had no further questions.

## 2023-12-02 NOTE — Telephone Encounter (Addendum)
PAP: Patient application pending due to MISSING PROOF OF INCOME, COPY OF INSURANCE CARDS, PATIENT SIGNATURE ON PATIENT AUTHORIZATION, MAILED LETTER TO PATIENT

## 2023-12-04 ENCOUNTER — Other Ambulatory Visit: Payer: Self-pay | Admitting: Family Medicine

## 2023-12-04 DIAGNOSIS — E7800 Pure hypercholesterolemia, unspecified: Secondary | ICD-10-CM

## 2023-12-14 ENCOUNTER — Telehealth: Payer: Self-pay | Admitting: Cardiology

## 2023-12-14 NOTE — Telephone Encounter (Signed)
 Pt states that he dropped off some paperwork for [employer] to have it sent to a specialty pharmacy for discount and they have not received anything yet. Fax [phone] [employer]. Requesting cb

## 2023-12-17 ENCOUNTER — Telehealth: Payer: Self-pay | Admitting: Pharmacy Technician

## 2023-12-17 NOTE — Telephone Encounter (Signed)
I tried to call the patient but received no answer and the mailbox is full. So far I do not see the requested information that was last noted on 12/02/23 of: "PAP: Patient application pending due to MISSING PROOF OF INCOME, COPY OF INSURANCE CARDS, PATIENT SIGNATURE ON PATIENT AUTHORIZATION"   See other encounters

## 2023-12-20 ENCOUNTER — Other Ambulatory Visit: Payer: Self-pay | Admitting: Family Medicine

## 2023-12-20 DIAGNOSIS — E034 Atrophy of thyroid (acquired): Secondary | ICD-10-CM

## 2023-12-21 ENCOUNTER — Other Ambulatory Visit: Payer: Self-pay | Admitting: Family Medicine

## 2023-12-21 DIAGNOSIS — E1129 Type 2 diabetes mellitus with other diabetic kidney complication: Secondary | ICD-10-CM

## 2024-01-13 ENCOUNTER — Other Ambulatory Visit (HOSPITAL_COMMUNITY): Payer: Self-pay

## 2024-01-13 NOTE — Telephone Encounter (Signed)
Received missing info. Faxed requested info to [employer].

## 2024-01-17 ENCOUNTER — Ambulatory Visit: Payer: Self-pay | Admitting: Family Medicine

## 2024-01-17 ENCOUNTER — Emergency Department (HOSPITAL_COMMUNITY)
Admission: EM | Admit: 2024-01-17 | Discharge: 2024-01-17 | Disposition: A | Discharge status: Home / Self Care | Payer: Medicare Other | Attending: Emergency Medicine | Admitting: Emergency Medicine

## 2024-01-17 ENCOUNTER — Encounter (HOSPITAL_COMMUNITY): Payer: Self-pay

## 2024-01-17 ENCOUNTER — Other Ambulatory Visit: Payer: Self-pay

## 2024-01-17 DIAGNOSIS — Z7984 Long term (current) use of oral hypoglycemic drugs: Secondary | ICD-10-CM | POA: Diagnosis not present

## 2024-01-17 DIAGNOSIS — I110 Hypertensive heart disease with heart failure: Secondary | ICD-10-CM | POA: Diagnosis not present

## 2024-01-17 DIAGNOSIS — F419 Anxiety disorder, unspecified: Secondary | ICD-10-CM | POA: Insufficient documentation

## 2024-01-17 DIAGNOSIS — Z7982 Long term (current) use of aspirin: Secondary | ICD-10-CM | POA: Insufficient documentation

## 2024-01-17 DIAGNOSIS — Z794 Long term (current) use of insulin: Secondary | ICD-10-CM | POA: Diagnosis not present

## 2024-01-17 DIAGNOSIS — R42 Dizziness and giddiness: Secondary | ICD-10-CM

## 2024-01-17 DIAGNOSIS — I509 Heart failure, unspecified: Secondary | ICD-10-CM | POA: Insufficient documentation

## 2024-01-17 DIAGNOSIS — E119 Type 2 diabetes mellitus without complications: Secondary | ICD-10-CM | POA: Diagnosis not present

## 2024-01-17 DIAGNOSIS — D72829 Elevated white blood cell count, unspecified: Secondary | ICD-10-CM | POA: Diagnosis not present

## 2024-01-17 DIAGNOSIS — Z79899 Other long term (current) drug therapy: Secondary | ICD-10-CM | POA: Insufficient documentation

## 2024-01-17 DIAGNOSIS — F32A Depression, unspecified: Secondary | ICD-10-CM | POA: Insufficient documentation

## 2024-01-17 DIAGNOSIS — E860 Dehydration: Secondary | ICD-10-CM | POA: Diagnosis not present

## 2024-01-17 LAB — RESP PANEL BY RT-PCR (RSV, FLU A&B, COVID)  RVPGX2
Influenza A by PCR: NEGATIVE
Influenza B by PCR: NEGATIVE
Resp Syncytial Virus by PCR: NEGATIVE
SARS Coronavirus 2 by RT PCR: NEGATIVE

## 2024-01-17 LAB — COMPREHENSIVE METABOLIC PANEL
ALT: 11 U/L (ref 0–44)
AST: 20 U/L (ref 15–41)
Albumin: 3.6 g/dL (ref 3.5–5.0)
Alkaline Phosphatase: 55 U/L (ref 38–126)
Anion gap: 12 (ref 5–15)
BUN: 19 mg/dL (ref 8–23)
CO2: 19 mmol/L — ABNORMAL LOW (ref 22–32)
Calcium: 9.5 mg/dL (ref 8.9–10.3)
Chloride: 107 mmol/L (ref 98–111)
Creatinine, Ser: 1.58 mg/dL — ABNORMAL HIGH (ref 0.44–1.00)
GFR, Estimated: 35 mL/min — ABNORMAL LOW (ref >60)
Glucose, Bld: 102 mg/dL — ABNORMAL HIGH (ref 70–99)
Potassium: 4.1 mmol/L (ref 3.5–5.1)
Sodium: 138 mmol/L (ref 135–145)
Total Bilirubin: 0.8 mg/dL (ref 0.0–1.2)
Total Protein: 7.4 g/dL (ref 6.5–8.1)

## 2024-01-17 LAB — URINALYSIS, ROUTINE W REFLEX MICROSCOPIC
Bilirubin Urine: NEGATIVE
Glucose, UA: >=500 mg/dL — AB
Ketones, ur: NEGATIVE mg/dL
Leukocytes,Ua: NEGATIVE
Nitrite: NEGATIVE
Protein, ur: NEGATIVE mg/dL
Specific Gravity, Urine: 1.024 (ref 1.005–1.030)
pH: 5 (ref 5.0–8.0)

## 2024-01-17 LAB — CBC
HCT: 47.1 % — ABNORMAL HIGH (ref 36.0–46.0)
Hemoglobin: 15.6 g/dL — ABNORMAL HIGH (ref 12.0–15.0)
MCH: 29.9 pg (ref 26.0–34.0)
MCHC: 33.1 g/dL (ref 30.0–36.0)
MCV: 90.4 fL (ref 80.0–100.0)
Platelets: 245 10*3/uL (ref 150–400)
RBC: 5.21 MIL/uL — ABNORMAL HIGH (ref 3.87–5.11)
RDW: 12.8 % (ref 11.5–15.5)
WBC: 11.7 10*3/uL — ABNORMAL HIGH (ref 4.0–10.5)
nRBC: 0 % (ref 0.0–0.2)

## 2024-01-17 NOTE — Telephone Encounter (Signed)
Chief Complaint: Dizzy spell Symptoms: Eyes blurring Frequency: Intermtitent Pertinent Negatives: Patient denies falling, lightheadedness Disposition: [x] ED /[] Urgent Care (no appt availability in office) / [] Appointment(In office/virtual)/ []  [facility]/ [] Home Care/ [] Refused Recommended Disposition /[] [facility]/ []  Follow-up with PCP Additional Notes: Pt states she had a dizzy spell this morning. Pt denies falling. Pt states her eyes feel funny. Pt states she has cataracts but feels like "everything is more blurred." Pt states she can still see fine. Pt states the dizzy spell has happened before. The dizziness has mostly gone away. Pt advised to go to ED. Pt states she has someone available to take her. Pt verbalized understanding and agrees to plan.   Reason for Disposition . [1] Dizziness (vertigo) present now AND [2] one or more STROKE RISK FACTORS (i.e., hypertension, diabetes, prior stroke/TIA, heart attack)  (Exception: Prior doctor or NP/PA evaluation for this AND no different/worse than usual.)  Answer Assessment - Initial Assessment Questions 2. VERTIGO: "Do you feel like either you or the room is spinning or tilting?"      Tilting 3. LIGHTHEADED: "Do you feel lightheaded?" (e.g., somewhat faint, woozy, weak upon standing)     Denies 4. SEVERITY: "How bad is it?"  "Can you walk?"   - MILD: Feels slightly dizzy and unsteady, but is walking normally.   - MODERATE: Feels unsteady when walking, but not falling; interferes with normal activities (e.g., school, work).   - SEVERE: Unable to walk without falling, or requires assistance to walk without falling.     No issues walking 5. ONSET:  "When did the dizziness begin?"     This morning 7. CAUSE: "What do you think is causing the dizziness?"     No  Protocols used: Dizziness - Vertigo-A-AH

## 2024-01-17 NOTE — Discharge Instructions (Signed)
Please drink plenty of fluids including electrolyte containing fluids such as Pedialyte, Gatorade.  Recommend eating 3 meals a day to help to prevent low blood sugar episodes considering you take insulin.  You may want to try a daily allergy medicine, and Flonase to help with some of the nasal congestion which could help with any brief vertigo episodes.  Your workup today was very reassuring, please return if you begin to have chest pain, new numbness, tingling, vision changes, or worsening or persistent dizziness despite treatment as above.

## 2024-01-17 NOTE — ED Provider Triage Note (Signed)
Emergency Medicine Provider Triage Evaluation Note  Felicia Hanson , a 68 y.o. female  was evaluated in triage.  Pt complains of dizziness.  Occurred this morning around 10 AM.  States the last couple days she is been feeling somewhat nauseous from vomited couple times.  Thinks her grandkids have the flu.  When she woke up around 10 she started to head towards the bathroom and felt the floor was slanted downward.  She felt like she was going to fall and felt lightheaded at that time.  Denies any preceding chest pain or palpitations or shortness of breath.  She sat down and symptoms improved.  Also reports ear fullness in both ears along with nasal congestion.  Dizziness lasted for about 3 minutes.  No longer dizzy at this time but wanted to "get checked out".  Review of Systems  Positive: See above Negative: See above  Physical Exam  BP 127/77 (BP Location: Right Arm)   Pulse 81   Temp 97.9 F (36.6 C)   Resp 16   Ht 5' (1.524 m)   Wt 68 kg   SpO2 100%   BMI 29.29 kg/m  Gen:   Awake, no distress   Resp:  Normal effort  MSK:   Moves extremities without difficulty  Other:    Medical Decision Making  Medically screening exam initiated at 4:51 PM.  Appropriate orders placed.  Felicia Hanson was informed that the remainder of the evaluation will be completed by another provider, this initial triage assessment does not replace that evaluation, and the importance of remaining in the ED until their evaluation is complete.  Work ups started   Felicia Hanson, [place] 01/17/24 1653

## 2024-01-17 NOTE — ED Provider Notes (Signed)
[facility] AT [facility] Provider Note   CSN: [id number] Arrival date & time: 01/17/24  1559     History  Chief Complaint  Patient presents with   Dizziness    Felicia Hanson is a 68 y.o. female who past medical history significant for hypertension, hyperlipidemia, diabetes, CHF, anxiety, depression who presents with concern for "a dizzy spell".  Patient reports that she was up and about taking care of young grandchildren and briefly felt lightheaded.  She denies any chest pain, shortness of breath.  Patient reports that she was recently exposed to the flu, has been having some GI upset, decreased appetite, decreased p.o. intake.  She endorses some respiratory congestion, reports that sometimes she will feel dizzy when her ears are stopped up.  She reports that during wait in the waiting room for around 6 hours she is completely returned to normal, no longer has any dizziness.  She reports that she was slightly worried because her husband did not respond to her when she called out for help.    Dizziness      Home Medications Prior to Admission medications   Medication Sig Start Date End Date Taking? Authorizing Provider  ACCU-CHEK AVIVA PLUS test strip daily. for testing 03/11/18   [provider]  albuterol (VENTOLIN HFA) 108 (90 Base) MCG/ACT inhaler Inhale 1 puff into the lungs every 6 (six) hours as needed for wheezing or shortness of breath.    [provider]  aspirin 81 MG tablet Take 81 mg by mouth daily.    [provider]  Azelastine HCl 137 MCG/SPRAY SOLN PLACE 1 SPRAY INTO BOTH NOSTRILS 2 (TWO) TIMES DAILY. USE IN EACH NOSTRIL AS DIRECTED IF RUNNY NOSE/DRAINAGE. 05/26/22   Shade Flood, MD  BLACK ELDERBERRY PO Take 10 mLs by mouth daily.    [provider]  cetirizine (ZYRTEC) 10 MG tablet Take 10 mg by mouth daily.    [provider]  Continuous Blood Gluc Receiver (DEXCOM G6 RECEIVER) DEVI 1  Device by Does not apply route every morning. Device and supplies 04/18/20   Shade Flood, MD  dapagliflozin propanediol (FARXIGA) 10 MG TABS tablet Take 1 tablet (10 mg total) by mouth daily. 05/04/23   Tolia, Sunit, DO  DULoxetine (CYMBALTA) 60 MG capsule TAKE 2 CAPSULES BY MOUTH DAILY 02/17/23   Shade Flood, MD  ezetimibe (ZETIA) 10 MG tablet TAKE 1 TABLET BY MOUTH EVERY DAY 12/22/23   Shade Flood, MD  famotidine (PEPCID) 10 MG tablet Take 10 mg by mouth daily.    [provider]  fenofibrate (TRICOR) 145 MG tablet TAKE 1 TABLET BY MOUTH EVERY DAY 08/24/23   Tolia, Sunit, DO  fluticasone (FLONASE) 50 MCG/ACT nasal spray Place 2 sprays into both nostrils daily. 03/15/22   Leath-Warren, Sadie Haber, NP  Fluticasone Furoate (ARNUITY ELLIPTA) 100 MCG/ACT AEPB Inhale 1 Inhalation into the lungs daily. 02/17/23   Shade Flood, MD  Insulin Pen Needle 31G X 5 MM MISC Inject 1 each into the skin daily. 02/10/21   Shade Flood, MD  ipratropium (ATROVENT) 0.03 % nasal spray Place 2 sprays into both nostrils 3 (three) times daily as needed for rhinitis. 04/23/23   Bing Neighbors, NP  levothyroxine (SYNTHROID) 50 MCG tablet TAKE 1 TABLET BY MOUTH EVERY DAY 12/21/23   Shade Flood, MD  magnesium oxide (MAG-OX) 400 MG tablet TAKE 0.5 TABLETS (200 MG TOTAL) BY MOUTH 2 TIMES DAILY. 02/17/23  Tolia, Sunit, DO  metoprolol succinate (TOPROL-XL) 100 MG 24 hr tablet TAKE 1 TABLET BY MOUTH EVERY DAY WITH OR IMMEDIATELY FOLLOWING A MEAL 07/08/23   Tolia, Sunit, DO  nitroGLYCERIN (NITROSTAT) 0.4 MG SL tablet PLACE 1 TABLET UNDER THE TONGUE EVERY 5 MINUTES AS NEEDED FOR CHEST PAIN. IF YOU REQUIRE MORE THAN TWO TABLETS FIVE MINUTES APART GO TO THE NEAREST ER VIA EMS. Patient taking differently: Place 0.4 mg under the tongue every 5 (five) minutes as needed for chest pain. 07/29/20   Tolia, Sunit, DO  omeprazole (PRILOSEC) 20 MG capsule TAKE 1 CAPSULE BY MOUTH EVERY DAY 01/11/23   Shade Flood, MD  pravastatin (PRAVACHOL) 80 MG tablet TAKE 1 TABLET BY MOUTH EVERY DAY IN THE EVENING 12/06/23   Shade Flood, MD  sacubitril-valsartan (ENTRESTO) 97-103 MG Take 1 tablet by mouth 2 (two) times daily. 12/03/22   Tolia, Sunit, DO  spironolactone (ALDACTONE) 25 MG tablet Take 1 tablet (25 mg total) by mouth daily. 02/17/23   Shade Flood, MD  topiramate (TOPAMAX) 50 MG tablet Take 1 tablet (50 mg total) by mouth 2 (two) times daily. 06/23/23   Hill, Manus Gunning, MD  TOUJEO SOLOSTAR 300 UNIT/ML Solostar Pen INJECT 50 UNITS INTO THE SKIN NIGHTLY. 10/25/23   Myrlene Broker, MD  TRULICITY 0.75 MG/0.5ML SOAJ INJECT 0.75MG  (0.5ML) UNDER THE SKIN ONCE A WEEK. 10/18/23   Myrlene Broker, MD  TRULICITY 1.5 MG/0.5ML SOAJ Inject into the skin. 06/16/23   [provider]  vitamin B-12 (CYANOCOBALAMIN) 100 MCG tablet Take 1,000 mcg by mouth daily.    [provider]  Vitamin D, Ergocalciferol, (DRISDOL) 1.25 MG (50000 UNIT) CAPS capsule Take 1 capsule (50,000 Units total) by mouth every 7 (seven) days. 11/15/23   Myrlene Broker, MD      Allergies    Metformin and related, Nicotine, and Ace inhibitors    Review of Systems   Review of Systems  Neurological:  Positive for dizziness.  All other systems reviewed and are negative.   Physical Exam Updated Vital Signs BP 126/69   Pulse 77   Temp 97.9 F (36.6 C)   Resp 16   Ht 5' (1.524 m)   Wt 68 kg   SpO2 99%   BMI 29.29 kg/m  Physical Exam Vitals and nursing note reviewed.  Constitutional:      General: She is not in acute distress.    Appearance: Normal appearance.  HENT:     Head: Normocephalic and atraumatic.  Eyes:     General:        Right eye: No discharge.        Left eye: No discharge.  Cardiovascular:     Rate and Rhythm: Normal rate and regular rhythm.     Heart sounds: No murmur heard.    No friction rub. No gallop.  Pulmonary:     Effort: Pulmonary effort is normal.     Breath  sounds: Normal breath sounds.     Comments: No wheezing, rhonchi, stridor, rales Abdominal:     General: Bowel sounds are normal.     Palpations: Abdomen is soft.  Skin:    General: Skin is warm and dry.     Capillary Refill: Capillary refill takes less than 2 seconds.  Neurological:     Mental Status: She is alert and oriented to person, place, and time.     Comments: Moves all 4 limbs spontaneously, CN II through XII grossly intact,  can ambulate without difficulty, intact sensation throughout.  Psychiatric:        Mood and Affect: Mood normal.        Behavior: Behavior normal.     ED Results / Procedures / Treatments   Labs (all labs ordered are listed, but only abnormal results are displayed) Labs Reviewed  CBC - Abnormal; Notable for the following components:      Result Value   WBC 11.7 (*)    RBC 5.21 (*)    Hemoglobin 15.6 (*)    HCT 47.1 (*)    All other components within normal limits  COMPREHENSIVE METABOLIC PANEL - Abnormal; Notable for the following components:   CO2 19 (*)    Glucose, Bld 102 (*)    Creatinine, Ser 1.58 (*)    GFR, Estimated 35 (*)    All other components within normal limits  URINALYSIS, ROUTINE W REFLEX MICROSCOPIC - Abnormal; Notable for the following components:   APPearance HAZY (*)    Glucose, UA >=500 (*)    Hgb urine dipstick SMALL (*)    Bacteria, UA RARE (*)    All other components within normal limits  RESP PANEL BY RT-PCR (RSV, FLU A&B, COVID)  [id number]    EKG EKG Interpretation Date/Time:  Monday January 17 2024 16:58:45 EST Ventricular Rate:  71 PR Interval:  170 QRS Duration:  126 QT Interval:  458 QTC Calculation: 497 R Axis:   135  Text Interpretation: Normal sinus rhythm Right axis deviation Non-specific intra-ventricular conduction block Minimal voltage criteria for LVH, may be normal variant ( Cornell product ) Cannot rule out Anteroseptal infarct , age undetermined T wave abnormality, consider inferior ischemia  Abnormal ECG When compared with ECG of 20-Jan-2021 02:00, PREVIOUS ECG IS PRESENT Confirmed by Richardean Canal [phone]) on 01/17/2024 10:37:19 PM  Radiology No results found.  Procedures Procedures    Medications Ordered in ED Medications - No data to display  ED Course/ Medical Decision Making/ A&P                                 Medical Decision Making  This patient is a 68 y.o. female  who presents to the ED for concern of dizziness.   Differential diagnoses prior to evaluation: The emergent differential diagnosis includes, but is not limited to,  BPPV, vestibular migraine, head trauma, AVM, intracranial tumor, multiple sclerosis, drug-related, CVA, orthostatic hypotension, sepsis, hypoglycemia, electrolyte disturbance, anemia, anxiety . This is not an exhaustive differential.   Past Medical History / Co-morbidities / Social History: hypertension, hyperlipidemia, diabetes, CHF, anxiety, depression  Additional history: Chart reviewed. Pertinent results include: Reviewed outpatient family medicine, cardiology visits  Physical Exam: Physical exam performed. The pertinent findings include: Vital signs stable in the emergency department, some mild upper respiratory congestion noted, but no wheezing, rhonchi, stridor, rales, or respiratory distress and lower lobes.  She has no increased work of breathing.  Normal heart rate and rhythm on my exam.  Lab Tests/Imaging studies: I personally interpreted labs/imaging and the pertinent results include: RVP negative for COVID, flu, RSV, CBC with mild leukocytosis, white blood cell 11.7, but hemoglobin also elevated at 15.6, suspect mild hemoconcentration, especially in context of CMP notable for mildly elevated creatinine 1.58, although not significantly changed from her baseline.  She does have mild bicarb deficit, CO2 19 without anion gap.  UA with rare bacteria but with some squamous contamination, low clinical suspicion  for acute urine tract  infection.  Cardiac monitoring: EKG obtained and interpreted by myself and attending physician which shows: Normal sinus rhythm, no evidence of acute st-t changes from baseline   Medications: Encouraged fluid rehydration, rest, congestion relief.   Disposition: After consideration of the diagnostic results and the patients response to treatment, I feel that patient is stable for discharge with plan as above .   emergency department workup does not suggest an emergent condition requiring admission or immediate intervention beyond what has been performed at this time. The plan is: as above. The patient is safe for discharge and has been instructed to return immediately for worsening symptoms, change in symptoms or any other concerns.  Final Clinical Impression(s) / ED Diagnoses Final diagnoses:  Dizziness  Dehydration    Rx / DC Orders ED Discharge Orders     None         [place] 01/17/24 2238    Charlynne Pander, MD 01/18/24 1110

## 2024-01-17 NOTE — ED Triage Notes (Addendum)
Pt reports "a dizzy spell this morning". Pt states that she did not fall/hit head, but wants to "clear things because of my age". Pt has not symptoms at this time.

## 2024-01-17 NOTE — Telephone Encounter (Signed)
  Answer Assessment - Initial Assessment Questions 1. REASON FOR CALL or QUESTION: "What is your reason for calling today?" or "How can I best help you?" or "What question do you have that I can help answer?"     Patient called back to clarify if it was truly a need for her to go to the ED if she was concerned, based off her s/s this morning, if she was having /had a stroke. Patient wanted to know if she could still take the appt the 1st nurse offered her at 1st before stating she could go to the ER. Pt stated this morning she had dizziness which made her grabbed hold of kitchen table this morning, room seemed to be tilting, blurred vision (which pt reports is due to cataracts). Pt states she has been feeling bad, weak all week, having stomach issues, has been exposed to flu & thinks she may had a stroke this morning.  I informed patient to truly know if she had/having stroke she needs to go to ER to be evaluated and treated: not to wait for an appointment for tomorrow.  Encouraged patient to mask up, practice good hygiene due to her be worried about getting sickness from people in ER and go: patient stated she would go now.  Protocols used: Information Only Call - No Triage-A-AH

## 2024-01-18 ENCOUNTER — Ambulatory Visit: Payer: Medicare Other | Admitting: Internal Medicine

## 2024-01-19 ENCOUNTER — Ambulatory Visit: Payer: Medicare Other | Admitting: Behavioral Health

## 2024-01-19 NOTE — Progress Notes (Unsigned)
   Deneise Lever, LMFT

## 2024-01-20 ENCOUNTER — Ambulatory Visit: Payer: Medicare Other | Admitting: Dermatology

## 2024-01-25 ENCOUNTER — Ambulatory Visit: Payer: Medicare Other | Admitting: Behavioral Health

## 2024-01-25 DIAGNOSIS — Z636 Dependent relative needing care at home: Secondary | ICD-10-CM

## 2024-01-25 DIAGNOSIS — F331 Major depressive disorder, recurrent, moderate: Secondary | ICD-10-CM

## 2024-01-25 DIAGNOSIS — F411 Generalized anxiety disorder: Secondary | ICD-10-CM | POA: Diagnosis not present

## 2024-01-25 NOTE — Progress Notes (Signed)
   Deneise Lever, LMFT
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
## 2024-01-26 ENCOUNTER — Telehealth: Payer: Self-pay | Admitting: Behavioral Health

## 2024-02-08 ENCOUNTER — Other Ambulatory Visit: Payer: Self-pay | Admitting: Family Medicine

## 2024-02-08 DIAGNOSIS — E034 Atrophy of thyroid (acquired): Secondary | ICD-10-CM

## 2024-02-09 ENCOUNTER — Telehealth: Payer: Self-pay

## 2024-02-09 ENCOUNTER — Other Ambulatory Visit: Payer: Self-pay

## 2024-02-09 ENCOUNTER — Other Ambulatory Visit (HOSPITAL_COMMUNITY): Payer: Self-pay

## 2024-02-09 MED ORDER — DAPAGLIFLOZIN PROPANEDIOL 10 MG PO TABS
10.0000 mg | ORAL_TABLET | Freq: Every day | ORAL | 3 refills | Status: DC
  Filled 2024-02-09: qty 90, 90d supply, fill #0

## 2024-02-09 NOTE — Telephone Encounter (Signed)
 Farxiga 10 mg once daily was refilled and sent to [facility]. Pt was called and notified. Pt verbalized understanding. All questions, if any, were answered.

## 2024-02-09 NOTE — Telephone Encounter (Addendum)
 PAP: Patient assistance application for Felicia Hanson has been approved by PAP Companies: [employer] from 02/09/24 to 11/29/24. Medication should be delivered to PAP Delivery: Home. For further shipping updates, please contact [employer] at [phone]. Patient ID is: [id number]  Spoke to patient, she is aware of approval. Also mentioned that RX advocates were faxing for her and that we provide the same service for free. Advised patient to cancel the service with them. Patient was grateful for the information.

## 2024-02-09 NOTE — Telephone Encounter (Signed)
 Patient Advocate Encounter   The patient was approved for a [employer] grant that will help cover the cost of FARXIGA Total amount awarded, $10,000.  Effective: 01/10/24 - 01/08/25   [id number] FAO:[id number] [id number] [id number]  Felicia Hanson, CPhT  Pharmacy Patient Advocate Specialist  Direct Number: [phone] Fax: [phone]

## 2024-02-09 NOTE — Telephone Encounter (Signed)
 Please send in prescription for FARXIGA to [facility]

## 2024-02-09 NOTE — Telephone Encounter (Signed)
 Sent completed application to [facility] to have patient come in office and sign

## 2024-02-10 ENCOUNTER — Encounter: Payer: Self-pay | Admitting: Pharmacist

## 2024-02-10 NOTE — Progress Notes (Signed)
   02/10/2024 Name: Felicia Hanson MRN: [id number] DOB: 04/11/1956  Chief Complaint  Patient presents with   medication access   Med technician from medication access team outreached about this patient - trouble affording toujeo and Trulicity.   Pt signed Trulicity application. Received PCP signature. Faxed to [employer].   Toujeo application filled out and left at front desk for patient to sign.   Will notify medication access team to complete documentation.   Verdene Rio, PharmD PGY1 Pharmacy Resident    Arbutus Leas, PharmD, BCPS, CPP Clinical Pharmacist Practitioner [facility] [phone]

## 2024-02-10 NOTE — Telephone Encounter (Signed)
 PAP: Application for Trulicity has been submitted to [employer], via fax  Submitted by office

## 2024-02-15 ENCOUNTER — Telehealth: Payer: Self-pay

## 2024-02-15 ENCOUNTER — Other Ambulatory Visit: Payer: Self-pay

## 2024-02-15 ENCOUNTER — Ambulatory Visit: Admitting: Internal Medicine

## 2024-02-15 MED ORDER — SACUBITRIL-VALSARTAN 97-103 MG PO TABS
1.0000 | ORAL_TABLET | Freq: Two times a day (BID) | ORAL | 3 refills | Status: DC
Start: 2024-02-15 — End: 2024-02-15

## 2024-02-15 MED ORDER — SACUBITRIL-VALSARTAN 97-103 MG PO TABS: 1.0000 | ORAL_TABLET | Freq: Two times a day (BID) | ORAL | 3 refills | Status: DC

## 2024-02-15 NOTE — Telephone Encounter (Signed)
 Please fax over script with MD signature

## 2024-02-15 NOTE — Telephone Encounter (Signed)
 Rx for [employer] for MD signature.

## 2024-02-15 NOTE — Telephone Encounter (Signed)
 Patient called saying [employer] needs RX to process shipment. Please send in prescription for Entresto to [facility] pharmacy.

## 2024-02-15 NOTE — Telephone Encounter (Signed)
 Signed prescription by Dr. Odis Hollingshead has been faxed the CoverMyMeds.

## 2024-02-16 ENCOUNTER — Ambulatory Visit: Payer: Medicare Other | Admitting: Behavioral Health

## 2024-02-16 ENCOUNTER — Other Ambulatory Visit: Payer: Self-pay | Admitting: Family Medicine

## 2024-02-17 ENCOUNTER — Ambulatory Visit: Payer: Medicare Other | Admitting: Dermatology

## 2024-02-24 NOTE — Telephone Encounter (Signed)
 PAP Being handled by office

## 2024-03-02 ENCOUNTER — Ambulatory Visit: Admitting: Behavioral Health

## 2024-03-02 NOTE — Progress Notes (Unsigned)
   Deneise Lever, LMFT

## 2024-03-08 ENCOUNTER — Ambulatory Visit: Admitting: Behavioral Health

## 2024-03-08 DIAGNOSIS — Z636 Dependent relative needing care at home: Secondary | ICD-10-CM | POA: Diagnosis not present

## 2024-03-08 DIAGNOSIS — F331 Major depressive disorder, recurrent, moderate: Secondary | ICD-10-CM | POA: Diagnosis not present

## 2024-03-08 DIAGNOSIS — F411 Generalized anxiety disorder: Secondary | ICD-10-CM | POA: Diagnosis not present

## 2024-03-08 NOTE — Progress Notes (Signed)
 [facility] Counselor/Therapist Progress Note  Patient ID: Felicia Hanson, MRN: [id number],    Date: 03/08/2024  Time Spent: 45 min In Person @ [facility] Time In: 2:00pm Time Out: 2:45pm   Treatment Type: Individual Therapy  Reported Symptoms: Reduction in anx/dep & stress over the past few wks  Mental Status Exam: Appearance:  Disheveled     Behavior: Appropriate and Sharing  Motor: Normal  Speech/Language:  Clear and Coherent  Affect: Appropriate  Mood: anxious  Thought process: normal  Thought content:   WNL  Sensory/Perceptual disturbances:   WNL  Orientation: oriented to person, place, time/date, and situation  Attention: Good  Concentration: Good  Memory: WNL  Fund of knowledge:  Good  Insight:   Fair  Judgment:  Fair  Impulse Control: Good   Risk Assessment: Danger to Self:  No; Pt sts today she would never take her own life bc her faith is too strong Self-injurious Behavior: No Danger to Others: No Duty to Warn:no Physical Aggression / Violence:No  Access to Firearms a concern: No  Gang Involvement:No   Subjective: Pt is upbeat & smiling today. The result of Clinician's instructions to place all guns in the home in a safe, which the Family already owned @ our last visit resulted in positive feedback by Pt the next day via phone. Gdtr took exception to Pt coming to psychotherapy for herself. Pt stood up for her choice & voiced this to Pt. Gdtr, who has 2 of the young children Pt was caring for in the home X 3 wks during this time was perplexed & angry Clinician gave the strict instructions. Husb did not have such a hard time. Pt is learning to stick up for herself w/Family & not let everything be blamed on her.   Pt has taken 2 of her older [place] B[place]ze out in the car @ night recently. She is afraid to drive @ night due to her eyesight, but she did it & they enjoyed the time tgthr.    Interventions: Family Systems  Diagnosis:GAD (generalized  anxiety disorder)  Moderate episode of recurrent major depressive disorder (HCC)  Caregiver burden  Plan: Ailie will cont to advocate & speak up for herself w/Family, reminding them she is a person who deserves her own time for herself & her mental health. Husb has been acting strange & she worries for his brain health. He will not tend to this w/an appt to his PCP for a Referral to Neurology. Aggie Cosier feels like this issue is dead in the water. Instead, she will do good things for herself to make life better & enjoy her Grandchildren.  Target Date: 03/29/2024  Progress: 5  Frequency: Once monthly  Modality: Claretta Fraise, LMFT

## 2024-03-08 NOTE — Addendum Note (Signed)
 Addended by: Deneise Lever on: 03/08/2024 04:01 PM   Modules accepted: Level of Service

## 2024-03-08 NOTE — Progress Notes (Signed)
   Felicia Lever, LMFT

## 2024-03-15 ENCOUNTER — Other Ambulatory Visit: Payer: Self-pay | Admitting: Family Medicine

## 2024-03-15 DIAGNOSIS — E034 Atrophy of thyroid (acquired): Secondary | ICD-10-CM

## 2024-03-15 DIAGNOSIS — R809 Proteinuria, unspecified: Secondary | ICD-10-CM

## 2024-03-15 DIAGNOSIS — I10 Essential (primary) hypertension: Secondary | ICD-10-CM

## 2024-03-20 ENCOUNTER — Other Ambulatory Visit: Payer: Self-pay | Admitting: Cardiology

## 2024-03-20 ENCOUNTER — Encounter: Payer: Self-pay | Admitting: Family Medicine

## 2024-03-20 ENCOUNTER — Ambulatory Visit: Admitting: Family Medicine

## 2024-03-20 ENCOUNTER — Ambulatory Visit: Payer: Self-pay | Admitting: *Deleted

## 2024-03-20 VITALS — BP 138/78 | HR 72 | Temp 98.6°F | Resp 20 | Ht 60.0 in | Wt 162.0 lb

## 2024-03-20 DIAGNOSIS — F439 Reaction to severe stress, unspecified: Secondary | ICD-10-CM | POA: Diagnosis not present

## 2024-03-20 DIAGNOSIS — I5042 Chronic combined systolic (congestive) and diastolic (congestive) heart failure: Secondary | ICD-10-CM | POA: Diagnosis not present

## 2024-03-20 DIAGNOSIS — I5022 Chronic systolic (congestive) heart failure: Secondary | ICD-10-CM

## 2024-03-20 MED ORDER — FUROSEMIDE 20 MG PO TABS: 20.0000 mg | ORAL_TABLET | Freq: Every day | ORAL | 0 refills | Status: DC

## 2024-03-20 NOTE — Patient Instructions (Signed)
 I suggest compression hose daily. Apply in the morning and remove in the evening.
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
## 2024-03-20 NOTE — Progress Notes (Unsigned)
 Assessment & Plan:  1. Chronic combined systolic and diastolic CHF (congestive heart failure) (HCC) (Primary) Worsening edema. Education provided on edema. Encouraged to apply compression hose in the morning and remove in the evening. Recommended low salt diet. Continue current medication regimen with the addition of furosemide  x3 days. - furosemide  (LASIX ) 20 MG tablet; Take 1 tablet (20 mg total) by mouth daily for 3 doses.  Dispense: 3 tablet; Refill: 0  2. Stress at home Offered a listening ear. Encouraged patient to seek support from family.    Follow up plan: Return if symptoms worsen or fail to improve.  Hershel Los, MSN, APRN, FNP-C  Subjective:  HPI: Felicia Hanson is a 68 y.o. female presenting on 03/20/2024 for Edema (Bilateral lower ext - R>L/Worse x 3 to 4 days )  Patient reports bilateral lower extremity edema x3-4 days with the right being worse than the left. She reports her legs look better in the mornings before she is up on them. Denies chest pain and shortness of breath. She does have a history of CHF.     ROS: Negative unless specifically indicated above in HPI.   Relevant past medical history reviewed and updated as indicated.   Allergies and medications reviewed and updated.   Current Outpatient Medications:    ACCU-CHEK AVIVA PLUS test strip, daily. for testing, Disp: , Rfl: 2   acetaminophen  (TYLENOL ) 325 MG tablet, Take 650 mg by mouth every 6 (six) hours as needed., Disp: , Rfl:    albuterol  (VENTOLIN  HFA) 108 (90 Base) MCG/ACT inhaler, Inhale 1 puff into the lungs every 6 (six) hours as needed for wheezing or shortness of breath., Disp: , Rfl:    ALPRAZolam  (XANAX ) 1 MG tablet, Take 1 mg by mouth 3 (three) times daily., Disp: , Rfl:    aspirin  81 MG tablet, Take 81 mg by mouth daily., Disp: , Rfl:    BLACK ELDERBERRY PO, Take 10 mLs by mouth daily., Disp: , Rfl:    cetirizine  (ZYRTEC ) 10 MG tablet, Take 10 mg by mouth daily., Disp: , Rfl:     dapagliflozin  propanediol (FARXIGA ) 10 MG TABS tablet, Take 1 tablet (10 mg total) by mouth daily., Disp: 90 tablet, Rfl: 3   DULoxetine  (CYMBALTA ) 60 MG capsule, TAKE 2 CAPSULES BY MOUTH DAILY, Disp: 180 capsule, Rfl: 1   ezetimibe  (ZETIA ) 10 MG tablet, TAKE 1 TABLET BY MOUTH EVERY DAY, Disp: 90 tablet, Rfl: 0   famotidine (PEPCID) 10 MG tablet, Take 10 mg by mouth daily., Disp: , Rfl:    insulin  glargine, 1 Unit Dial, (TOUJEO  SOLOSTAR) 300 UNIT/ML Solostar Pen, INJECT 50 UNITS INTO THE SKIN DAILY, Disp: 4.5 mL, Rfl: 6   Insulin  Pen Needle 31G X 5 MM MISC, Inject 1 each into the skin daily., Disp: 100 each, Rfl: 2   levothyroxine  (SYNTHROID ) 50 MCG tablet, TAKE 1 TABLET BY MOUTH EVERY DAY, Disp: 90 tablet, Rfl: 1   magnesium  oxide (MAG-OX) 400 MG tablet, TAKE 0.5 TABLETS (200 MG TOTAL) BY MOUTH 2 TIMES DAILY., Disp: 90 tablet, Rfl: 1   metoprolol  succinate (TOPROL -XL) 100 MG 24 hr tablet, TAKE 1 TABLET BY MOUTH EVERY DAY WITH OR IMMEDIATELY FOLLOWING A MEAL, Disp: 90 tablet, Rfl: 2   Probiotic Product (PROBIOTIC BLEND PO), Take 1 capsule by mouth daily., Disp: , Rfl:    sacubitril -valsartan  (ENTRESTO ) 97-103 MG, Take 1 tablet by mouth 2 (two) times daily., Disp: 180 tablet, Rfl: 3   spironolactone  (ALDACTONE ) 25 MG tablet, Take 1  tablet (25 mg total) by mouth daily., Disp: 90 tablet, Rfl: 1   topiramate  (TOPAMAX ) 50 MG tablet, Take 1 tablet (50 mg total) by mouth 2 (two) times daily., Disp: 180 tablet, Rfl: 3   TRULICITY  0.75 MG/0.5ML SOAJ, INJECT 0.75MG  (0.5ML) UNDER THE SKIN ONCE A WEEK., Disp: 2 mL, Rfl: 0   vitamin B-12 (CYANOCOBALAMIN ) 100 MCG tablet, Take 1,000 mcg by mouth daily., Disp: , Rfl:    fenofibrate  (TRICOR ) 145 MG tablet, TAKE 1 TABLET BY MOUTH EVERY DAY (Patient not taking: Reported on 03/20/2024), Disp: 90 tablet, Rfl: 2   fluticasone  (FLONASE ) 50 MCG/ACT nasal spray, Place 2 sprays into both nostrils daily. (Patient not taking: Reported on 03/20/2024), Disp: 16 g, Rfl: 0    ipratropium (ATROVENT ) 0.03 % nasal spray, Place 2 sprays into both nostrils 3 (three) times daily as needed for rhinitis. (Patient not taking: Reported on 03/20/2024), Disp: 30 mL, Rfl: 0   nitroGLYCERIN  (NITROSTAT ) 0.4 MG SL tablet, PLACE 1 TABLET UNDER THE TONGUE EVERY 5 MINUTES AS NEEDED FOR CHEST PAIN. IF YOU REQUIRE MORE THAN TWO TABLETS FIVE MINUTES APART GO TO THE NEAREST ER VIA EMS. (Patient not taking: Reported on 03/20/2024), Disp: 25 tablet, Rfl: 1   pravastatin  (PRAVACHOL ) 80 MG tablet, TAKE 1 TABLET BY MOUTH EVERY DAY IN THE EVENING (Patient not taking: Reported on 03/20/2024), Disp: 90 tablet, Rfl: 1   Vitamin D , Ergocalciferol , (DRISDOL ) 1.25 MG (50000 UNIT) CAPS capsule, Take 1 capsule (50,000 Units total) by mouth every 7 (seven) days. (Patient not taking: Reported on 03/20/2024), Disp: 13 capsule, Rfl: 3  Allergies  Allergen Reactions   Metformin And Related Anaphylaxis and Other (See Comments)    Could not swallow   Nicotine  Rash    Only allergic to nicotine  patch   Ace Inhibitors Cough    Cough with lisinopril .     Objective:   BP 138/78   Pulse 72   Temp 98.6 F (37 C)   Resp 20   Ht 5' (1.524 m)   Wt 162 lb (73.5 kg)   BMI 31.64 kg/m    Physical Exam Vitals reviewed.  Constitutional:      General: She is not in acute distress.    Appearance: Normal appearance. She is not ill-appearing, toxic-appearing or diaphoretic.  HENT:     Head: Normocephalic and atraumatic.  Eyes:     General: No scleral icterus.       Right eye: No discharge.        Left eye: No discharge.     Conjunctiva/sclera: Conjunctivae normal.  Cardiovascular:     Rate and Rhythm: Normal rate and regular rhythm.     Heart sounds: Normal heart sounds. No murmur heard.    No friction rub. No gallop.  Pulmonary:     Effort: Pulmonary effort is normal. No respiratory distress.     Breath sounds: Normal breath sounds. No stridor. No wheezing, rhonchi or rales.  Musculoskeletal:         General: Normal range of motion.     Cervical back: Normal range of motion.     Right lower leg: 2+ Edema present.     Left lower leg: 1+ Edema present.  Skin:    General: Skin is warm and dry.     Capillary Refill: Capillary refill takes less than 2 seconds.  Neurological:     General: No focal deficit present.     Mental Status: She is alert and oriented to person, place, and time.  Mental status is at baseline.  Psychiatric:        Attention and Perception: Attention and perception normal.        Mood and Affect: Affect is tearful.        Speech: Speech normal.        Behavior: Behavior normal.        Thought Content: Thought content normal.        Cognition and Memory: Cognition and memory normal.        Judgment: Judgment normal.

## 2024-03-24 ENCOUNTER — Other Ambulatory Visit: Payer: Self-pay | Admitting: Internal Medicine

## 2024-03-24 ENCOUNTER — Telehealth: Payer: Self-pay | Admitting: Internal Medicine

## 2024-03-24 DIAGNOSIS — I5042 Chronic combined systolic (congestive) and diastolic (congestive) heart failure: Secondary | ICD-10-CM

## 2024-03-24 NOTE — Telephone Encounter (Signed)
 Copied from CRM [id number]. Topic: Clinical - Medication Refill >> Mar 24, 2024  1:48 PM Zipporah Him wrote: Most Recent Primary Care Visit:  Provider: Zorita Hiss  Department: [facility]  Visit Type: ACUTE  Date: 03/20/2024  Medication: furosemide  (LASIX ) 20 MG tablet   Has the patient contacted their pharmacy? No  Is this the correct pharmacy for this prescription? Yes If no, delete pharmacy and type the correct one.  This is the patient's preferred pharmacy:  [facility] [phone] Jonette Nestle, [place] - [address] [place] [address] Phone: [phone] Fax: [phone]     Has the prescription been filled recently? No  Is the patient out of the medication? No  Has the patient been seen for an appointment in the last year OR does the patient have an upcoming appointment? Yes  Can we respond through MyChart? No  Agent: Please be advised that Rx refills may take up to 3 business days. We ask that you follow-up with your pharmacy. >> Mar 24, 2024  3:47 PM Kita Perish H wrote: Patient following up on message sent today for there furosemide  (LASIX ) 20 MG tablet  refill shows sent on 4/21 and discontinued by provider. Agent tried to advise patient of refill turnaround time and patient states she needs her medication because the way her heart pumps. Please reach out to patient, thanks.  Shilynn  [phone]

## 2024-03-24 NOTE — Telephone Encounter (Signed)
 Copied from CRM [id number]. Topic: Clinical - Medication Refill >> Mar 24, 2024  1:48 PM Zipporah Him wrote: Most Recent Primary Care Visit:  Provider: Zorita Hiss  Department: [facility]  Visit Type: ACUTE  Date: 03/20/2024  Medication: furosemide  (LASIX ) 20 MG tablet   Has the patient contacted their pharmacy? No  Is this the correct pharmacy for this prescription? Yes If no, delete pharmacy and type the correct one.  This is the patient's preferred pharmacy:  [facility] [phone] Jonette Nestle, [place] - [address] [place] [address] Phone: [phone] Fax: [phone]     Has the prescription been filled recently? No  Is the patient out of the medication? No  Has the patient been seen for an appointment in the last year OR does the patient have an upcoming appointment? Yes  Can we respond through MyChart? No  Agent: Please be advised that Rx refills may take up to 3 business days. We ask that you follow-up with your pharmacy.

## 2024-03-26 ENCOUNTER — Telehealth: Payer: Self-pay

## 2024-03-26 ENCOUNTER — Ambulatory Visit
Admission: EM | Admit: 2024-03-26 | Discharge: 2024-03-26 | Disposition: A | Discharge status: Home / Self Care | Attending: Physician Assistant | Admitting: Physician Assistant

## 2024-03-26 DIAGNOSIS — R220 Localized swelling, mass and lump, head: Secondary | ICD-10-CM

## 2024-03-26 DIAGNOSIS — Z9189 Other specified personal risk factors, not elsewhere classified: Secondary | ICD-10-CM

## 2024-03-26 MED ORDER — AMOXICILLIN 500 MG PO CAPS
500.0000 mg | ORAL_CAPSULE | Freq: Three times a day (TID) | ORAL | 0 refills | Status: DC
Start: 2024-03-26 — End: 2024-07-07

## 2024-03-26 MED ORDER — HYDROCODONE-ACETAMINOPHEN 5-325 MG PO TABS: 1.0000 | ORAL_TABLET | Freq: Three times a day (TID) | ORAL | 0 refills | Status: DC | PRN

## 2024-03-26 NOTE — Discharge Instructions (Signed)
  Do NOT take acetaminophen  days you take hydrocodone .

## 2024-03-26 NOTE — ED Triage Notes (Signed)
"  I went to my primary care provider for leg swelling and she said you have Edema and was placed on a new medication for this". "I noticed the swelling reducing on the medication but more and more dark spots appearing me in various places and dental/gum pain for new reason (no teeth, numerous extractions). No fever "but the swelling on my face/around the dental area is warm to tough".

## 2024-03-26 NOTE — ED Provider Notes (Signed)
 [facility]    CSN: [id number] Arrival date & time: 03/26/24  1402      History   Chief Complaint Chief Complaint  Patient presents with   Oral Pain   Oral Swelling   Skin Problem    HPI Felicia Hanson is a 68 y.o. female.   Patient here today for evaluation of swelling to her left lower jaw.  She reports that similar to prior pain and swelling with abscesses but she has had her teeth removed and currently uses dentures.  She denies any nausea or vomiting but has had some questionable low-grade fever at times.  She reports the pain is severe and she is unable to take NSAIDs due to kidney function.  She has tried Tylenol  without relief.  The history is provided by the patient.    Past Medical History:  Diagnosis Date   Allergy    Anxiety    CHF (congestive heart failure) (HCC) 01/20/2021   COPD (chronic obstructive pulmonary disease) (HCC)    Coronary artery calcification    Depression    Diabetes mellitus without complication (HCC)    Diverticulitis    with colon resection   GERD (gastroesophageal reflux disease)    on pepcid   Hyperlipidemia    Hypertension    Sleep apnea    cpap on order 01-01-22   Sleep paralysis    will be tested for narcolepsy per pt 01-01-22   Thyroid  disease     Patient Active Problem List   Diagnosis Date Noted   Skin lesion of face 11/18/2023   Routine general medical examination at a health care facility 11/18/2023   MDD (major depressive disorder), recurrent episode (HCC) 03/03/2023   Benign essential tremor 05/25/2022   Loud snoring 03/24/2021   Excessive daytime sleepiness 03/24/2021   Panlobular emphysema (HCC) 03/24/2021   GAD (generalized anxiety disorder) 03/24/2021   Chronic combined systolic and diastolic CHF (congestive heart failure) (HCC) 01/20/2021   Type 2 diabetes with complication (HCC) 10/26/2017   Vitamin D  deficiency 01/10/2014   Essential hypertension, benign 01/10/2014   Hyperlipidemia associated  with type 2 diabetes mellitus (HCC) 01/10/2014   Hypothyroidism 01/10/2014    Past Surgical History:  Procedure Laterality Date   BREAST EXCISIONAL BIOPSY Left    CESAREAN SECTION     x2   CHOLECYSTECTOMY     COLON SURGERY     diverticulitis with perforation; s/p colon resection.   Colonoscopy     COLONOSCOPY     DENTAL SURGERY     RIGHT/LEFT HEART CATH AND CORONARY ANGIOGRAPHY N/A 01/22/2021   Procedure: RIGHT/LEFT HEART CATH AND CORONARY ANGIOGRAPHY;  Surgeon: Knox Perl, MD;  Location: [facility];  Service: Cardiovascular;  Laterality: N/A;   TUBAL LIGATION      OB History   No obstetric history on file.      Home Medications    Prior to Admission medications   Medication Sig Start Date End Date Taking? Authorizing Provider  amoxicillin  (AMOXIL ) 500 MG capsule Take 1 capsule (500 mg total) by mouth 3 (three) times daily. 03/26/24  Yes Vernestine Gondola, PA-C  aspirin  81 MG tablet Take 81 mg by mouth daily.   Yes [provider]  BLACK ELDERBERRY PO Take 10 mLs by mouth daily.   Yes [provider]  cetirizine  (ZYRTEC ) 10 MG tablet Take 10 mg by mouth daily.   Yes [provider]  dapagliflozin  propanediol (FARXIGA ) 10 MG TABS tablet Take 1 tablet (  10 mg total) by mouth daily. 02/09/24  Yes Tolia, Sunit, DO  DULoxetine  (CYMBALTA ) 60 MG capsule TAKE 2 CAPSULES BY MOUTH DAILY 02/17/23  Yes Benjiman Bras, MD  ezetimibe  (ZETIA ) 10 MG tablet TAKE 1 TABLET BY MOUTH EVERY DAY 12/22/23  Yes Benjiman Bras, MD  famotidine (PEPCID) 10 MG tablet Take 10 mg by mouth daily.   Yes [provider]  furosemide  (LASIX ) 20 MG tablet Take 1 tablet (20 mg total) by mouth daily for 3 doses. Patient taking differently: Take 20 mg by mouth daily. Last dose: 03-24-2024 03/20/24 03/26/24 Yes Hershel Los F, FNP  insulin  glargine, 1 Unit Dial, (TOUJEO  SOLOSTAR) 300 UNIT/ML Solostar Pen INJECT 50 UNITS INTO THE SKIN DAILY 02/18/24  Yes Adelia Homestead, MD  Insulin  Pen Needle 31G X 5 MM MISC Inject 1 each into the skin daily. 02/10/21  Yes Benjiman Bras, MD  levothyroxine  (SYNTHROID ) 50 MCG tablet TAKE 1 TABLET BY MOUTH EVERY DAY 12/21/23  Yes Benjiman Bras, MD  magnesium  oxide (MAG-OX) 400 MG tablet TAKE 0.5 TABLETS (200 MG TOTAL) BY MOUTH 2 TIMES DAILY. 02/17/23  Yes Tolia, Sunit, DO  metoprolol  succinate (TOPROL -XL) 100 MG 24 hr tablet TAKE 1 TABLET BY MOUTH EVERY DAY WITH OR IMMEDIATELY FOLLOWING A MEAL Patient taking differently: Take 100 mg by mouth daily. 03/23/24  Yes Tolia, Sunit, DO  pravastatin  (PRAVACHOL ) 80 MG tablet TAKE 1 TABLET BY MOUTH EVERY DAY IN THE EVENING 12/06/23  Yes Benjiman Bras, MD  Probiotic Product (PROBIOTIC BLEND PO) Take 1 capsule by mouth daily.   Yes [provider]  sacubitril -valsartan  (ENTRESTO ) 97-103 MG Take 1 tablet by mouth 2 (two) times daily. 02/15/24  Yes Tolia, Sunit, DO  spironolactone  (ALDACTONE ) 25 MG tablet Take 1 tablet (25 mg total) by mouth daily. 02/17/23  Yes Benjiman Bras, MD  topiramate  (TOPAMAX ) 50 MG tablet Take 1 tablet (50 mg total) by mouth 2 (two) times daily. 06/23/23  Yes Ellene Gustin, MD  TRULICITY  0.75 MG/0.5ML SOAJ INJECT 0.75MG  (0.5ML) UNDER THE SKIN ONCE A WEEK. 10/18/23  Yes Adelia Homestead, MD  vitamin B-12 (CYANOCOBALAMIN ) 100 MCG tablet Take 1,000 mcg by mouth daily.   Yes [provider]  Vitamin D , Ergocalciferol , (DRISDOL ) 1.25 MG (50000 UNIT) CAPS capsule Take 1 capsule (50,000 Units total) by mouth every 7 (seven) days. 11/15/23  Yes Adelia Homestead, MD  ACCU-CHEK AVIVA PLUS test strip daily. for testing 03/11/18   [provider]  acetaminophen  (TYLENOL ) 325 MG tablet Take 650 mg by mouth every 6 (six) hours as needed.    [provider]  albuterol  (VENTOLIN  HFA) 108 (90 Base) MCG/ACT inhaler Inhale 1 puff into the lungs every 6 (six) hours as needed for wheezing or shortness of breath.    [provider]   ALPRAZolam  (XANAX ) 1 MG tablet Take 1 mg by mouth 3 (three) times daily. 02/11/24   [provider]  fenofibrate  (TRICOR ) 145 MG tablet TAKE 1 TABLET BY MOUTH EVERY DAY Patient not taking: Reported on 03/20/2024 08/24/23   Tolia, Sunit, DO  fluticasone  (FLONASE ) 50 MCG/ACT nasal spray Place 2 sprays into both nostrils daily. Patient not taking: Reported on 03/20/2024 03/15/22   Leath-Warren, Belen Bowers, NP  HYDROcodone -acetaminophen  (NORCO/VICODIN) 5-325 MG tablet Take 1 tablet by mouth every 8 (eight) hours as needed. 03/26/24   Vernestine Gondola, PA-C  ipratropium (ATROVENT ) 0.03 % nasal spray Place 2 sprays into both nostrils 3 (three) times  daily as needed for rhinitis. Patient not taking: Reported on 03/20/2024 04/23/23   Buena Carmine, NP  nitroGLYCERIN  (NITROSTAT ) 0.4 MG SL tablet PLACE 1 TABLET UNDER THE TONGUE EVERY 5 MINUTES AS NEEDED FOR CHEST PAIN. IF YOU REQUIRE MORE THAN TWO TABLETS FIVE MINUTES APART GO TO THE NEAREST ER VIA EMS. Patient not taking: Reported on 03/20/2024 07/29/20   Olinda Bertrand, DO    Family History Family History  Problem Relation Age of Onset   Colon polyps Mother    Cancer Mother        unknown primary   Hyperlipidemia Mother    Other Father    Breast cancer Paternal Aunt    Esophageal cancer Paternal Uncle    Colon cancer Neg Hx    Rectal cancer Neg Hx    Stomach cancer Neg Hx     Social History Social History   Tobacco Use   Smoking status: Every Day    Current packs/day: 0.25    Average packs/day: 0.3 packs/day for 38.0 years (9.5 ttl pk-yrs)    Types: Cigarettes   Smokeless tobacco: Never   Tobacco comments:    2-5 A DAY  Vaping Use   Vaping status: Never Used  Substance Use Topics   Alcohol use: Not Currently   Drug use: Never     Allergies   Metformin and related, Nicotine , and Ace inhibitors   Review of Systems Review of Systems  Constitutional:  Positive for fever (? subjective low grade). Negative for chills.   HENT:  Positive for facial swelling. Negative for dental problem.   Eyes:  Negative for discharge and redness.  Respiratory:  Negative for shortness of breath.   Gastrointestinal:  Negative for abdominal pain, nausea and vomiting.     Physical Exam Triage Vital Signs ED Triage Vitals  Encounter Vitals Group     BP      Systolic BP Percentile      Diastolic BP Percentile      Pulse      Resp      Temp      Temp src      SpO2      Weight      Height      Head Circumference      Peak Flow      Pain Score      Pain Loc      Pain Education      Exclude from Growth Chart    No data found.  Updated Vital Signs BP (!) 143/76 (BP Location: Right Arm) Comment: "probably because of the pain"  Pulse 78   Temp 97.8 F (36.6 C) (Oral)   Resp 18   Ht 5' (1.524 m)   Wt 162 lb 0.6 oz (73.5 kg)   SpO2 98%   BMI 31.65 kg/m   Visual Acuity Right Eye Distance:   Left Eye Distance:   Bilateral Distance:    Right Eye Near:   Left Eye Near:    Bilateral Near:     Physical Exam Vitals and nursing note reviewed.  Constitutional:      General: She is not in acute distress.    Appearance: Normal appearance. She is not ill-appearing.  HENT:     Head: Normocephalic and atraumatic.     Mouth/Throat:     Comments: Lower dentures noted, mild swelling appreciated to left lower jaw area Eyes:     Conjunctiva/sclera: Conjunctivae normal.  Cardiovascular:     Rate and  Rhythm: Normal rate.  Pulmonary:     Effort: Pulmonary effort is normal.  Neurological:     Mental Status: She is alert.  Psychiatric:        Mood and Affect: Mood normal.        Behavior: Behavior normal.        Thought Content: Thought content normal.      UC Treatments / Results  Labs (all labs ordered are listed, but only abnormal results are displayed) Labs Reviewed - No data to display  EKG   Radiology No results found.  Procedures Procedures (including critical care time)  Medications  Ordered in UC Medications - No data to display  Initial Impression / Assessment and Plan / UC Course  I have reviewed the triage vital signs and the nursing notes.  Pertinent labs & imaging results that were available during my care of the patient were reviewed by me and considered in my medical decision making (see chart for details).    Treat to cover possible infectious cause of symptoms.  Discussed pain management and patient is unable to take anti-inflammatory medication and Toradol contraindicated.  Tramadol also contraindicated due to medication interaction with duloxetine .  Will trial very low-dose hydrocodone  as patient has taken this safely in the past.  Controlled substance record reviewed and appropriate.  Very small quantity of hydrocodone  provided for pain relief.  Recommended further evaluation in the emergency room with any worsening symptoms.  Final Clinical Impressions(s) / UC Diagnoses   Final diagnoses:  Facial swelling     Discharge Instructions       Do NOT take acetaminophen  days you take hydrocodone .      ED Prescriptions     Medication Sig Dispense Auth. Provider   amoxicillin  (AMOXIL ) 500 MG capsule Take 1 capsule (500 mg total) by mouth 3 (three) times daily. 21 capsule Parveen Freehling F, PA-C   HYDROcodone -acetaminophen  (NORCO/VICODIN) 5-325 MG tablet  (Status: Discontinued) Take 1 tablet by mouth every 8 (eight) hours as needed. 6 tablet Trigo Winterbottom F, PA-C   HYDROcodone -acetaminophen  (NORCO/VICODIN) 5-325 MG tablet Take 1 tablet by mouth every 8 (eight) hours as needed. 6 tablet Vernestine Gondola, PA-C      I have reviewed the PDMP during this encounter.   Vernestine Gondola, PA-C 03/26/24 1525

## 2024-03-26 NOTE — Telephone Encounter (Signed)
 See Visit (04-27, Urgent Care) for details. Discussed with provider.

## 2024-03-27 ENCOUNTER — Telehealth: Payer: Self-pay | Admitting: Internal Medicine

## 2024-03-27 NOTE — Telephone Encounter (Signed)
 Copied from CRM [id number]. Topic: Clinical - Medication Question >> Mar 27, 2024  3:56 PM Felicia Hanson wrote: Reason for CRM: Patient is schedule for an follow up office visit  on May 7 with provider. She was seen on 03/26/2024 at the urgent care and prescribed HYDROcodone -acetaminophen  (NORCO/VICODIN) 5-325 MG tablet.She would like to know is there any way that she could have a refill of this medication until she comes in to her appointment on May 7.She has swelling in her mouth that they told her looks like an abscess.

## 2024-03-28 NOTE — Telephone Encounter (Signed)
 No this is controlled and cannot be prescribed outside a visit. Ok to switch acute to another provider at this office for sooner apt if needed.

## 2024-03-28 NOTE — Telephone Encounter (Signed)
**Note De-identified  [place] Obfuscation** Please advise 

## 2024-03-28 NOTE — Telephone Encounter (Signed)
 Please advise if patient should continue taking this medication

## 2024-03-29 NOTE — Telephone Encounter (Signed)
 If she feels she needs to continue this, she needs to follow-up with her PCP. I treated swelling that she does not have chronically.

## 2024-03-30 ENCOUNTER — Telehealth: Payer: Self-pay
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
## 2024-04-05 ENCOUNTER — Ambulatory Visit: Admitting: Internal Medicine

## 2024-04-05 ENCOUNTER — Ambulatory Visit: Admitting: Behavioral Health

## 2024-04-08 ENCOUNTER — Other Ambulatory Visit: Payer: Self-pay | Admitting: Family Medicine

## 2024-04-10 NOTE — Telephone Encounter (Signed)
 Came to [facility] by mistake

## 2024-04-14 DIAGNOSIS — M545 Low back pain, unspecified: Secondary | ICD-10-CM | POA: Diagnosis not present

## 2024-04-18 ENCOUNTER — Ambulatory Visit: Admitting: Internal Medicine

## 2024-04-28 ENCOUNTER — Encounter: Payer: Self-pay | Admitting: Internal Medicine

## 2024-04-28 ENCOUNTER — Other Ambulatory Visit: Payer: Self-pay | Admitting: Family Medicine

## 2024-04-28 ENCOUNTER — Ambulatory Visit: Admitting: Internal Medicine

## 2024-04-28 VITALS — BP 138/60 | HR 69 | Temp 97.9°F | Ht 60.0 in | Wt 162.0 lb

## 2024-04-28 DIAGNOSIS — Z7985 Long-term (current) use of injectable non-insulin antidiabetic drugs: Secondary | ICD-10-CM

## 2024-04-28 DIAGNOSIS — E118 Type 2 diabetes mellitus with unspecified complications: Secondary | ICD-10-CM

## 2024-04-28 DIAGNOSIS — F331 Major depressive disorder, recurrent, moderate: Secondary | ICD-10-CM

## 2024-04-28 DIAGNOSIS — I10 Essential (primary) hypertension: Secondary | ICD-10-CM

## 2024-04-28 DIAGNOSIS — E034 Atrophy of thyroid (acquired): Secondary | ICD-10-CM

## 2024-04-28 DIAGNOSIS — Z794 Long term (current) use of insulin: Secondary | ICD-10-CM

## 2024-04-28 LAB — HEMOGLOBIN A1C: Hgb A1c MFr Bld: 5.5 % (ref 4.6–6.5)

## 2024-04-28 MED ORDER — TRULICITY 3 MG/0.5ML ~~LOC~~ SOAJ: 3.0000 mg | SUBCUTANEOUS | 0 refills | Status: DC

## 2024-04-28 NOTE — Assessment & Plan Note (Signed)
 We will increase dosing of trulicity  to 3 mg weekly. Checking HGA1c.

## 2024-04-28 NOTE — Assessment & Plan Note (Addendum)
 This is flare due to caregiver stress. She is seeing mental health and sees them again in less than 1 week. She will discuss. She does feel safe at home.

## 2024-04-28 NOTE — Patient Instructions (Signed)
 We have sent in the higher dose of trulicity  to try when you refill this to help with the weight.

## 2024-04-28 NOTE — Progress Notes (Signed)
   Subjective:   Patient ID: Felicia Hanson, female    DOB: [birth year], 68 y.o.   MRN: [id number]  HPI The patient is a 68 YO female coming in for concerns about weight gain. She was recently at urgent care for jaw pain which is resolved. She is also having some mood changes and struggling with caretaking for spouse.   Review of Systems  Constitutional:  Positive for unexpected weight change.  HENT: Negative.    Eyes: Negative.   Respiratory:  Negative for cough, chest tightness and shortness of breath.   Cardiovascular:  Negative for chest pain, palpitations and leg swelling.  Gastrointestinal:  Negative for abdominal distention, abdominal pain, constipation, diarrhea, nausea and vomiting.  Musculoskeletal: Negative.   Skin: Negative.   Neurological: Negative.   Psychiatric/Behavioral: Negative.      Objective:  Physical Exam Constitutional:      Appearance: She is well-developed.  HENT:     Head: Normocephalic and atraumatic.  Cardiovascular:     Rate and Rhythm: Normal rate and regular rhythm.  Pulmonary:     Effort: Pulmonary effort is normal. No respiratory distress.     Breath sounds: Normal breath sounds. No wheezing or rales.  Abdominal:     General: Bowel sounds are normal. There is no distension.     Palpations: Abdomen is soft.     Tenderness: There is no abdominal tenderness. There is no rebound.  Musculoskeletal:     Cervical back: Normal range of motion.  Skin:    General: Skin is warm and dry.  Neurological:     Mental Status: She is alert and oriented to person, place, and time.     Coordination: Coordination normal.     Vitals:   04/28/24 1145  BP: 138/60  Pulse: 69  Temp: 97.9 F (36.6 C)  TempSrc: Oral  SpO2: 98%  Weight: 162 lb (73.5 kg)  Height: 5' (1.524 m)    Assessment & Plan:  Visit time 20 minutes in face to face communication with patient and coordination of care, additional 10 minutes spent in record review, coordination or care,  ordering tests, communicating/referring to other healthcare professionals, documenting in medical records all on the same day of the visit for total time 30 minutes spent on the visit.

## 2024-05-01 ENCOUNTER — Ambulatory Visit: Payer: Self-pay | Admitting: Internal Medicine

## 2024-05-03 ENCOUNTER — Ambulatory Visit: Admitting: Behavioral Health

## 2024-05-22 ENCOUNTER — Telehealth: Payer: Self-pay | Admitting: Internal Medicine

## 2024-05-22 NOTE — Telephone Encounter (Signed)
 Copied from CRM [id number]. Topic: Clinical - Red Word Triage >> May 22, 2024  1:00 PM [name] I wrote: Red Word that prompted transfer to Nurse Triage:  Felicia Hanson from [facility] was calling to report that she did a screening test for Periferal vascular Disease called [employer] and both lower extremities show that patient might have some moderate decreased circulation but patient doesn't complain of leg pain, both feet are cold to touch and have diminished pulses.  Call back number if any additional questions [phone].

## 2024-05-26 NOTE — Telephone Encounter (Signed)
 Can we obtain copy?

## 2024-05-30 ENCOUNTER — Ambulatory Visit: Admitting: Behavioral Health

## 2024-06-04 LAB — FECAL OCCULT BLOOD, IMMUNOCHEMICAL: IFOBT: NEGATIVE

## 2024-06-04 LAB — MICROALBUMIN / CREATININE URINE RATIO: Microalb Creat Ratio: 30

## 2024-06-07 DIAGNOSIS — M25512 Pain in left shoulder: Secondary | ICD-10-CM | POA: Diagnosis not present

## 2024-06-07 DIAGNOSIS — M5030 Other cervical disc degeneration, unspecified cervical region: Secondary | ICD-10-CM | POA: Diagnosis not present

## 2024-06-10 ENCOUNTER — Other Ambulatory Visit: Payer: Self-pay | Admitting: Family Medicine

## 2024-06-10 DIAGNOSIS — E1129 Type 2 diabetes mellitus with other diabetic kidney complication: Secondary | ICD-10-CM

## 2024-06-13 ENCOUNTER — Ambulatory Visit: Admitting: Behavioral Health

## 2024-06-13 NOTE — Progress Notes (Unsigned)
 [facility] Counselor/Therapist Progress Note  Patient ID: Felicia Hanson, MRN: [id number],    Date: 06/13/2024  Time Spent: ***   Treatment Type: {CHL AMB THERAPY TYPES:[phone]}  Reported Symptoms: ***  Mental Status Exam: Appearance:  {PSY:22683}     Behavior: {PSY:21022743}  Motor: {PSY:22302}  Speech/Language:  {PSY:22685}  Affect: {PSY:22687}  Mood: {PSY:31886}  Thought process: {PSY:31888}  Thought content:   {PSY:772-865-5969}  Sensory/Perceptual disturbances:   {[phone]}  Orientation: {PSY:30297}  Attention: {PSY:22877}  Concentration: {PSY:641-061-0150}  Memory: {PSY:262-177-9975}  Fund of knowledge:  {PSY:641-061-0150}  Insight:   {PSY:641-061-0150}  Judgment:  {PSY:641-061-0150}  Impulse Control: {PSY:641-061-0150}   Risk Assessment: Danger to Self:  {PSY:22692} Self-injurious Behavior: {PSY:22692} Danger to Others: {PSY:22692} Duty to Warn:{PSY:311194} Physical Aggression / Violence:{PSY:21197} Access to Firearms a concern: {PSY:21197} Gang Involvement:{PSY:21197}  Subjective: ***   Interventions: {PSY:806-416-3216}  Diagnosis:No diagnosis found.  Plan: ***  [name], LMFT

## 2024-06-13 NOTE — Progress Notes (Deleted)
 [facility] Counselor/Therapist Progress Note  Patient ID: Magdalynn G Earnhart, MRN: [id number]    Date: 06/13/24  Time Spent: ***  {LBBHAMPM:26719} - *** {LBBHAMPM:26719} : *** Minutes  Treatment Type: Individual Therapy.  Reported Symptoms: ***  Mental Status Exam: Appearance:  {PSY:22683}     Behavior: {PSY:21022743}  Motor: {PSY:22302}  Speech/Language:  {PSY:22685}  Affect: {PSY:22687}  Mood: {PSY:31886}  Thought process: {PSY:31888}  Thought content:   {PSY:618-800-9204}  Sensory/Perceptual disturbances:   {PSY:223 786 7498}  Orientation: {PSY:30297}  Attention: {PSY:22877}  Concentration: {PSY:714-274-1696}  Memory: {PSY:([phone])558-2406}  Fund of knowledge:  {PSY:714-274-1696}  Insight:   {PSY:714-274-1696}  Judgment:  {PSY:714-274-1696}  Impulse Control: {PSY:714-274-1696}   Risk Assessment: Danger to Self:  {PSY:22692} Self-injurious Behavior: {PSY:22692} Danger to Others: {PSY:22692} Duty to Warn:{PSY:311194} Physical Aggression / Violence:{PSY:21197} Access to Firearms a concern: {PSY:21197} Gang Involvement:{PSY:21197}  Subjective:   Loa KANDICE Elbe participated in the session, in person in the office with the therapist, and consented to treatment.   ***   Interventions: {[phone]}  Diagnosis:  No diagnosis found.   Plan: ***Patient is to use CBT, mindfulness and coping skills to help manage decrease symptoms associated with their diagnosis.   Long-term goal:   ***Reduce overall level, frequency, and intensity of the feelings of depression, anxiety and panic evidenced by       decreased irritability, negative self talk, and helpless feelings from 6 to 7 days/week to 0 to 1 days/week per client report for at least 3 consecutive months.  Short-term goal:  ***Verbally express understanding of the relationship between feelings of depression, anxiety and their impact on thinking patterns and behaviors. Verbalize an understanding of the role that distorted  thinking plays in creating fears, excessive worry, and ruminations.  [name], LMFT

## 2024-06-15 ENCOUNTER — Ambulatory Visit (INDEPENDENT_AMBULATORY_CARE_PROVIDER_SITE_OTHER): Admitting: Behavioral Health

## 2024-06-15 DIAGNOSIS — F331 Major depressive disorder, recurrent, moderate: Secondary | ICD-10-CM

## 2024-06-15 DIAGNOSIS — F411 Generalized anxiety disorder: Secondary | ICD-10-CM
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
## 2024-06-16 ENCOUNTER — Encounter: Payer: Self-pay | Admitting: Internal Medicine

## 2024-06-16 ENCOUNTER — Telehealth: Payer: Self-pay

## 2024-06-16 NOTE — Telephone Encounter (Signed)
 Was routed to [employer]. Currently a patient of Dr.Crawford.
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
## 2024-06-29 NOTE — Telephone Encounter (Signed)
 I Have requested by phone

## 2024-07-05 ENCOUNTER — Encounter: Payer: Self-pay | Admitting: Internal Medicine

## 2024-07-06 ENCOUNTER — Ambulatory Visit: Admitting: Dermatology

## 2024-07-07 ENCOUNTER — Other Ambulatory Visit

## 2024-07-07 ENCOUNTER — Encounter: Payer: Self-pay | Admitting: Neurology

## 2024-07-07 ENCOUNTER — Ambulatory Visit: Admitting: Neurology

## 2024-07-07 VITALS — BP 120/77 | HR 82 | Ht 60.0 in | Wt 164.0 lb

## 2024-07-07 DIAGNOSIS — R209 Unspecified disturbances of skin sensation: Secondary | ICD-10-CM | POA: Diagnosis not present

## 2024-07-07 DIAGNOSIS — G250 Essential tremor: Secondary | ICD-10-CM

## 2024-07-07 NOTE — Patient Instructions (Addendum)
 I will get lab work today to look at potential causes of your abnormal sensation. I will be in touch when I have those results.  We discussed nerve testing (EMG). You would like to think about it. I'm including more information below. Let me know if you want to do it.  Continue topamax  50 mg twice daily for tremors.  I will see you back in clinic in 6 months. Please let me know if you have any questions or concerns in the meantime.  The physicians and staff at [facility] are committed to providing excellent care. You may receive a survey requesting feedback about your experience at our office. We strive to receive very good responses to the survey questions. If you feel that your experience would prevent you from giving the office a very good  response, please contact our office to try to remedy the situation. We may be reached at [phone]. Thank you for taking the time out of your busy day to complete the survey.  Venetia Potters, MD [facility]  ELECTROMYOGRAM AND NERVE CONDUCTION STUDIES (EMG/NCS) INSTRUCTIONS  How to Prepare The neurologist conducting the EMG will need to know if you have certain medical conditions. Tell the neurologist and other EMG lab personnel if you: Have a pacemaker or any other electrical medical device Take blood-thinning medications Have hemophilia, a blood-clotting disorder that causes prolonged bleeding Bathing Take a shower or bath shortly before your exam in order to remove oils from your skin. Don't apply lotions or creams before the exam.  What to Expect You'll likely be asked to change into a hospital gown for the procedure and lie down on an examination table. The following explanations can help you understand what will happen during the exam.  Electrodes. The neurologist or a technician places surface electrodes at various locations on your skin depending on where you're experiencing symptoms. Or the neurologist may insert needle  electrodes at different sites depending on your symptoms.  Sensations. The electrodes will at times transmit a tiny electrical current that you may feel as a twinge or spasm. The needle electrode may cause discomfort or pain that usually ends shortly after the needle is removed. If you are concerned about discomfort or pain, you may want to talk to the neurologist about taking a short break during the exam.  Instructions. During the needle EMG, the neurologist will assess whether there is any spontaneous electrical activity when the muscle is at rest - activity that isn't present in healthy muscle tissue - and the degree of activity when you slightly contract the muscle.  He or she will give you instructions on resting and contracting a muscle at appropriate times. Depending on what muscles and nerves the neurologist is examining, he or she may ask you to change positions during the exam.  After your EMG You may experience some temporary, minor bruising where the needle electrode was inserted into your muscle. This bruising should fade within several days. If it persists, contact your primary care doctor.

## 2024-07-11 ENCOUNTER — Ambulatory Visit: Payer: Self-pay | Admitting: Neurology

## 2024-07-11 ENCOUNTER — Telehealth: Payer: Self-pay

## 2024-07-11 NOTE — Telephone Encounter (Signed)
 Copied from CRM [id number]. Topic: General - Other >> Jul 11, 2024  9:24 AM Mesmerise C wrote: Reason for CRM: Massie from [facility] confirming if patient has diagnosis of diabetes or chronic heart failure can be called back to confirm diagnoses

## 2024-07-12 LAB — IMMUNOFIXATION ELECTROPHORESIS
IgG (Immunoglobin G), Serum: 1252 mg/dL (ref 600–1540)
IgM, Serum: 140 mg/dL (ref 50–300)
Immunoglobulin A: 514 mg/dL — ABNORMAL HIGH (ref 70–320)

## 2024-07-12 LAB — VITAMIN B12: Vitamin B-12: 849 pg/mL (ref 200–1100)

## 2024-07-12 LAB — METHYLMALONIC ACID, SERUM: Methylmalonic Acid, Quant: 252 nmol/L (ref 69–390)

## 2024-07-12 LAB — VITAMIN B1: Vitamin B1 (Thiamine): <6 nmol/L — ABNORMAL LOW (ref 8–30)

## 2024-07-12 NOTE — Telephone Encounter (Signed)
 Patient has both

## 2024-07-13 ENCOUNTER — Telehealth: Payer: Self-pay

## 2024-07-13 NOTE — Telephone Encounter (Signed)
 Spoke with therisa from [facility] to give message back to [place] on behalf of patient and there was no note on there end but [place] did make a new note in regards to this and for the representative to see this when they open it and review it. Reference number is #[id number].

## 2024-07-13 NOTE — Progress Notes (Signed)
 Called pt and relayed Dr. Loralee message. Pt was appreciative and had no further questions.

## 2024-07-13 NOTE — Telephone Encounter (Signed)
 I have made a separate encounter in regards to this I spoke with [place]

## 2024-07-24 ENCOUNTER — Other Ambulatory Visit: Payer: Self-pay

## 2024-07-24 DIAGNOSIS — G250 Essential tremor: Secondary | ICD-10-CM

## 2024-07-24 MED ORDER — TOPIRAMATE 50 MG PO TABS
50.0000 mg | ORAL_TABLET | Freq: Two times a day (BID) | ORAL | 3 refills | Status: AC
Start: 2024-07-24 — End: ?

## 2024-07-26 ENCOUNTER — Telehealth: Payer: Self-pay | Admitting: Internal Medicine

## 2024-07-26 NOTE — Telephone Encounter (Unsigned)
 Copied from CRM [id number]. Topic: Clinical - Medication Refill >> Jul 26, 2024 11:16 AM Robinson H wrote: Medication: insulin  glargine, 1 Unit Dial, (TOUJEO  SOLOSTAR) 300 UNIT/ML Solostar Pen, Vitamin D , Ergocalciferol , (DRISDOL ) 1.25 MG (50000 UNIT) CAPS capsule, levothyroxine  (SYNTHROID ) 50 MCG tablet, Dulaglutide  (TRULICITY ) 3 MG/0.75ML SOAJ, ezetimibe  (ZETIA ) 10 MG tablet, pravastatin  (PRAVACHOL ) 80 MG tablet  Has the patient contacted their pharmacy? Yes, Franky from [facility] pharmacy calling for refill (Agent: If no, request that the patient contact the pharmacy for the refill. If patient does not wish to contact the pharmacy document the reason why and proceed with request.) (Agent: If yes, when and what did the pharmacy advise?)  This is the patient's preferred pharmacy:    [facility] - [place], [place] - [address] [place] [address] Phone: [phone] Fax: [phone]  Is this the correct pharmacy for this prescription? Yes If no, delete pharmacy and type the correct one.   Has the prescription been filled recently? No  Is the patient out of the medication? Yes  Has the patient been seen for an appointment in the last year OR does the patient have an upcoming appointment? Yes  Can we respond through MyChart? N/A  Agent: Please be advised that Rx refills may take up to 3 business days. We ask that you follow-up with your pharmacy.

## 2024-07-28 ENCOUNTER — Other Ambulatory Visit: Payer: Self-pay

## 2024-07-28 DIAGNOSIS — I5022 Chronic systolic (congestive) heart failure: Secondary | ICD-10-CM

## 2024-07-28 DIAGNOSIS — E781 Pure hyperglyceridemia: Secondary | ICD-10-CM

## 2024-07-28 DIAGNOSIS — I5042 Chronic combined systolic (congestive) and diastolic (congestive) heart failure: Secondary | ICD-10-CM

## 2024-07-28 DIAGNOSIS — R9439 Abnormal result of other cardiovascular function study: Secondary | ICD-10-CM

## 2024-07-28 MED ORDER — SACUBITRIL-VALSARTAN 97-103 MG PO TABS: 1.0000 | ORAL_TABLET | Freq: Two times a day (BID) | ORAL | 0 refills | Status: DC

## 2024-07-28 MED ORDER — METOPROLOL SUCCINATE ER 100 MG PO TB24: 100.0000 mg | ORAL_TABLET | Freq: Every day | ORAL | 0 refills | Status: AC

## 2024-07-28 MED ORDER — NITROGLYCERIN 0.4 MG SL SUBL: 0.4000 mg | SUBLINGUAL_TABLET | SUBLINGUAL | 0 refills | Status: AC | PRN

## 2024-07-28 MED ORDER — MAGNESIUM OXIDE 400 MG PO TABS: 200.0000 mg | ORAL_TABLET | Freq: Two times a day (BID) | ORAL | 0 refills | Status: DC

## 2024-07-28 MED ORDER — DAPAGLIFLOZIN PROPANEDIOL 10 MG PO TABS: 10.0000 mg | ORAL_TABLET | Freq: Every day | ORAL | 0 refills | Status: AC

## 2024-07-28 MED ORDER — FENOFIBRATE 145 MG PO TABS: 145.0000 mg | ORAL_TABLET | Freq: Every day | ORAL | 0 refills | Status: DC

## 2024-08-07 ENCOUNTER — Other Ambulatory Visit: Payer: Self-pay | Admitting: Family Medicine

## 2024-08-07 ENCOUNTER — Ambulatory Visit: Admitting: Internal Medicine

## 2024-08-07 ENCOUNTER — Other Ambulatory Visit: Payer: Self-pay | Admitting: Internal Medicine

## 2024-08-07 ENCOUNTER — Other Ambulatory Visit: Payer: Self-pay | Admitting: Neurology

## 2024-08-07 DIAGNOSIS — G250 Essential tremor: Secondary | ICD-10-CM

## 2024-08-07 DIAGNOSIS — R7989 Other specified abnormal findings of blood chemistry: Secondary | ICD-10-CM

## 2024-08-07 DIAGNOSIS — E1129 Type 2 diabetes mellitus with other diabetic kidney complication: Secondary | ICD-10-CM

## 2024-08-07 DIAGNOSIS — I10 Essential (primary) hypertension: Secondary | ICD-10-CM

## 2024-08-07 DIAGNOSIS — E034 Atrophy of thyroid (acquired): Secondary | ICD-10-CM

## 2024-08-11 NOTE — Telephone Encounter (Signed)
 Called pt about Topamax  as to what pharmacy she would like it sent to Dr Leigh sent it to [facility] - [place], [place] - [address] [place], [place] [address] Phone: [phone]  Fax: [phone]  But this is [facility].   Unable to leave voice mail.

## 2024-08-15 ENCOUNTER — Ambulatory Visit (INDEPENDENT_AMBULATORY_CARE_PROVIDER_SITE_OTHER)

## 2024-08-15 ENCOUNTER — Ambulatory Visit
Admission: EM | Admit: 2024-08-15 | Discharge: 2024-08-15 | Disposition: A | Discharge status: Home / Self Care | Attending: Internal Medicine | Admitting: Internal Medicine

## 2024-08-15 ENCOUNTER — Encounter: Payer: Self-pay | Admitting: Emergency Medicine

## 2024-08-15 DIAGNOSIS — R051 Acute cough: Secondary | ICD-10-CM

## 2024-08-15 DIAGNOSIS — R0602 Shortness of breath: Secondary | ICD-10-CM

## 2024-08-15 DIAGNOSIS — S22080A Wedge compression fracture of T11-T12 vertebra, initial encounter for closed fracture: Secondary | ICD-10-CM | POA: Diagnosis not present

## 2024-08-15 DIAGNOSIS — B9789 Other viral agents as the cause of diseases classified elsewhere: Secondary | ICD-10-CM

## 2024-08-15 DIAGNOSIS — R059 Cough, unspecified: Secondary | ICD-10-CM | POA: Diagnosis not present

## 2024-08-15 LAB — POC COVID19/FLU A&B COMBO
Covid Antigen, POC: NEGATIVE
Influenza A Antigen, POC: NEGATIVE
Influenza B Antigen, POC: NEGATIVE

## 2024-08-15 LAB — POCT RESPIRATORY SYNCYTIAL VIRUS: RSV Rapid Ag: NEGATIVE

## 2024-08-15 MED ORDER — DEXAMETHASONE SODIUM PHOSPHATE 10 MG/ML IJ SOLN
10.0000 mg | Freq: Once | INTRAMUSCULAR | Status: AC
  Administered 2024-08-15: 10 mg via INTRAMUSCULAR

## 2024-08-15 MED ORDER — BENZONATATE 100 MG PO CAPS
100.0000 mg | ORAL_CAPSULE | Freq: Three times a day (TID) | ORAL | 0 refills | Status: DC
Start: 2024-08-15 — End: 2024-10-03

## 2024-08-15 NOTE — ED Triage Notes (Signed)
 Pt presents c/o productive cough x 3 days. Pt reports her husband was treated earlier this week and she is starting to have the same sxs. Pt also c/o SOB x 3 days.

## 2024-08-15 NOTE — ED Provider Notes (Addendum)
 [facility]    CSN: [id number] Arrival date & time: 08/15/24  1917      History   Chief Complaint Chief Complaint  Patient presents with   Cough   chest congestion    HPI Felicia Hanson is a 68 y.o. female.   68 year old female presents urgent care with complaints of productive cough and shortness of breath for 3 days.  She reports that her husband was treated several days ago for similar symptoms.  At that time he was tested for flu and COVID and this was negative.  She has developed symptoms since then that have worsened.  She denies fevers, chills, nausea, vomiting.  She does relate that the cough is productive of a thick phlegm.   Cough Associated symptoms: shortness of breath   Associated symptoms: no chest pain, no chills, no ear pain, no fever, no rash and no sore throat     Past Medical History:  Diagnosis Date   Allergy    Anxiety    CHF (congestive heart failure) (HCC) 01/20/2021   COPD (chronic obstructive pulmonary disease) (HCC)    Coronary artery calcification    Depression    Diabetes mellitus without complication (HCC)    Diverticulitis    with colon resection   GERD (gastroesophageal reflux disease)    on pepcid   Hyperlipidemia    Hypertension    Sleep apnea    cpap on order 01-01-22   Sleep paralysis    will be tested for narcolepsy per pt 01-01-22   Thyroid  disease     Patient Active Problem List   Diagnosis Date Noted   Skin lesion of face 11/18/2023   Routine general medical examination at a health care facility 11/18/2023   MDD (major depressive disorder), recurrent episode (HCC) 03/03/2023   Benign essential tremor 05/25/2022   Loud snoring 03/24/2021   Excessive daytime sleepiness 03/24/2021   Panlobular emphysema (HCC) 03/24/2021   GAD (generalized anxiety disorder) 03/24/2021   Chronic combined systolic and diastolic CHF (congestive heart failure) (HCC) 01/20/2021   Type 2 diabetes with complication (HCC) 10/26/2017    Vitamin D  deficiency 01/10/2014   Essential hypertension, benign 01/10/2014   Hyperlipidemia associated with type 2 diabetes mellitus (HCC) 01/10/2014   Hypothyroidism 01/10/2014    Past Surgical History:  Procedure Laterality Date   BREAST EXCISIONAL BIOPSY Left    CESAREAN SECTION     x2   CHOLECYSTECTOMY     COLON SURGERY     diverticulitis with perforation; s/p colon resection.   Colonoscopy     COLONOSCOPY     DENTAL SURGERY     RIGHT/LEFT HEART CATH AND CORONARY ANGIOGRAPHY N/A 01/22/2021   Procedure: RIGHT/LEFT HEART CATH AND CORONARY ANGIOGRAPHY;  Surgeon: Ladona Heinz, MD;  Location: [facility];  Service: Cardiovascular;  Laterality: N/A;   TUBAL LIGATION      OB History   No obstetric history on file.      Home Medications    Prior to Admission medications   Medication Sig Start Date End Date Taking? Authorizing Provider  benzonatate  (TESSALON ) 100 MG capsule Take 1 capsule (100 mg total) by mouth every 8 (eight) hours. 08/15/24  Yes Randy Whitener A, PA-C  ACCU-CHEK AVIVA PLUS test strip daily. for testing 03/11/18   [provider]  acetaminophen  (TYLENOL ) 325 MG tablet Take 650 mg by mouth every 6 (six) hours as needed.    [provider]  albuterol  (VENTOLIN  HFA) 108 (90 Base) MCG/ACT  inhaler Inhale 1 puff into the lungs every 6 (six) hours as needed for wheezing or shortness of breath.    [provider]  ALPRAZolam  (XANAX ) 1 MG tablet Take 1 mg by mouth 3 (three) times daily. 02/11/24   [provider]  aspirin  81 MG tablet Take 81 mg by mouth daily.    [provider]  BLACK ELDERBERRY PO Take 10 mLs by mouth daily.    [provider]  cetirizine  (ZYRTEC ) 10 MG tablet Take 10 mg by mouth daily.    [provider]  dapagliflozin  propanediol (FARXIGA ) 10 MG TABS tablet Take 1 tablet (10 mg total) by mouth daily. 07/28/24   Tolia, Sunit, DO  Dulaglutide  (TRULICITY ) 3 MG/0.5ML SOAJ Inject 3 mg as  directed once a week. 04/28/24   Rollene Almarie LABOR, MD  DULoxetine  (CYMBALTA ) 60 MG capsule TAKE 2 CAPSULES BY MOUTH DAILY 02/17/23   Levora Reyes SAUNDERS, MD  ezetimibe  (ZETIA ) 10 MG tablet TAKE 1 TABLET BY MOUTH EVERY DAY 06/12/24   Levora Reyes SAUNDERS, MD  famotidine (PEPCID) 10 MG tablet Take 10 mg by mouth daily.    [provider]  fenofibrate  (TRICOR ) 145 MG tablet Take 1 tablet (145 mg total) by mouth daily. 07/28/24   Tolia, Sunit, DO  fluticasone  (FLONASE ) 50 MCG/ACT nasal spray Place 2 sprays into both nostrils daily. 03/15/22   Leath-Warren, Etta PARAS, NP  insulin  glargine, 1 Unit Dial, (TOUJEO  SOLOSTAR) 300 UNIT/ML Solostar Pen INJECT 50 UNITS INTO THE SKIN DAILY 02/18/24   Rollene Almarie LABOR, MD  Insulin  Pen Needle 31G X 5 MM MISC Inject 1 each into the skin daily. 02/10/21   Greene, Jeffrey R, MD  ipratropium (ATROVENT ) 0.03 % nasal spray Place 2 sprays into both nostrils 3 (three) times daily as needed for rhinitis. 04/23/23   Arloa Suzen RAMAN, NP  levothyroxine  (SYNTHROID ) 50 MCG tablet TAKE 1 TABLET BY MOUTH EVERY DAY 12/21/23   Levora Reyes SAUNDERS, MD  magnesium  oxide (MAG-OX) 400 MG tablet Take 0.5 tablets (200 mg total) by mouth 2 (two) times daily. 07/28/24   Tolia, Sunit, DO  metoprolol  succinate (TOPROL -XL) 100 MG 24 hr tablet Take 1 tablet (100 mg total) by mouth daily. Take with or immediately following a meal. 07/28/24   Tolia, Sunit, DO  nitroGLYCERIN  (NITROSTAT ) 0.4 MG SL tablet Place 1 tablet (0.4 mg total) under the tongue every 5 (five) minutes as needed for chest pain. 07/28/24   Tolia, Sunit, DO  pravastatin  (PRAVACHOL ) 80 MG tablet TAKE 1 TABLET BY MOUTH EVERY DAY IN THE EVENING 12/06/23   Levora Reyes SAUNDERS, MD  Probiotic Product (PROBIOTIC BLEND PO) Take 1 capsule by mouth daily.    [provider]  sacubitril -valsartan  (ENTRESTO ) 97-103 MG Take 1 tablet by mouth 2 (two) times daily. 07/28/24   Tolia, Sunit, DO  spironolactone  (ALDACTONE ) 25 MG tablet Take 1  tablet (25 mg total) by mouth daily. 02/17/23   Levora Reyes SAUNDERS, MD  topiramate  (TOPAMAX ) 50 MG tablet Take 1 tablet (50 mg total) by mouth 2 (two) times daily. 07/24/24   Leigh Venetia CROME, MD  vitamin B-12 (CYANOCOBALAMIN ) 100 MCG tablet Take 1,000 mcg by mouth daily.    [provider]  Vitamin D , Ergocalciferol , (DRISDOL ) 1.25 MG (50000 UNIT) CAPS capsule Take 1 capsule (50,000 Units total) by mouth every 7 (seven) days. 11/15/23   Rollene Almarie LABOR, MD    Family History Family History  Problem Relation Age of Onset   Colon polyps  Mother    Cancer Mother        unknown primary   Hyperlipidemia Mother    Other Father    Breast cancer Paternal Aunt    Esophageal cancer Paternal Uncle    Colon cancer Neg Hx    Rectal cancer Neg Hx    Stomach cancer Neg Hx     Social History Social History   Tobacco Use   Smoking status: Every Day    Current packs/day: 0.25    Average packs/day: 0.3 packs/day for 38.0 years (9.5 ttl pk-yrs)    Types: Cigarettes    Passive exposure: Current   Smokeless tobacco: Never   Tobacco comments:    2-5 A DAY  Vaping Use   Vaping status: Never Used  Substance Use Topics   Alcohol use: Not Currently   Drug use: Never     Allergies   Metformin and related, Nicotine , and Ace inhibitors   Review of Systems Review of Systems  Constitutional:  Negative for chills and fever.  HENT:  Negative for ear pain and sore throat.   Eyes:  Negative for pain and visual disturbance.  Respiratory:  Positive for cough and shortness of breath.   Cardiovascular:  Negative for chest pain and palpitations.  Gastrointestinal:  Negative for abdominal pain and vomiting.  Genitourinary:  Negative for dysuria and hematuria.  Musculoskeletal:  Negative for arthralgias and back pain.  Skin:  Negative for color change and rash.  Neurological:  Negative for seizures and syncope.  All other systems reviewed and are negative.    Physical Exam Triage Vital  Signs ED Triage Vitals  Encounter Vitals Group     BP 08/15/24 1959 (!) 166/75     Girls Systolic BP Percentile --      Girls Diastolic BP Percentile --      Boys Systolic BP Percentile --      Boys Diastolic BP Percentile --      Pulse Rate 08/15/24 1959 82     Resp 08/15/24 1959 18     Temp 08/15/24 1959 98.7 F (37.1 C)     Temp Source 08/15/24 1959 Oral     SpO2 08/15/24 1959 98 %     Weight 08/15/24 1958 164 lb 0.4 oz (74.4 kg)     Height --      Head Circumference --      Peak Flow --      Pain Score 08/15/24 1957 4     Pain Loc --      Pain Education --      Exclude from Growth Chart --    No data found.  Updated Vital Signs BP (!) 166/75 (BP Location: Right Arm)   Pulse 82   Temp 98.7 F (37.1 C) (Oral)   Resp 18   Wt 164 lb 0.4 oz (74.4 kg)   SpO2 98%   BMI 32.03 kg/m   Visual Acuity Right Eye Distance:   Left Eye Distance:   Bilateral Distance:    Right Eye Near:   Left Eye Near:    Bilateral Near:     Physical Exam Vitals and nursing note reviewed.  Constitutional:      General: She is not in acute distress.    Appearance: She is well-developed.  HENT:     Head: Normocephalic and atraumatic.  Eyes:     Conjunctiva/sclera: Conjunctivae normal.  Cardiovascular:     Rate and Rhythm: Normal rate and regular rhythm.  Heart sounds: No murmur heard. Pulmonary:     Effort: Pulmonary effort is normal. No tachypnea or respiratory distress.     Breath sounds: Examination of the right-middle field reveals rhonchi. Examination of the left-middle field reveals rhonchi. Rhonchi present. No decreased breath sounds.  Abdominal:     Palpations: Abdomen is soft.     Tenderness: There is no abdominal tenderness.  Musculoskeletal:        General: No swelling.     Cervical back: Neck supple.  Skin:    General: Skin is warm and dry.     Capillary Refill: Capillary refill takes less than 2 seconds.  Neurological:     Mental Status: She is alert.   Psychiatric:        Mood and Affect: Mood normal.      UC Treatments / Results  Labs (all labs ordered are listed, but only abnormal results are displayed) Labs Reviewed  POC COVID19/FLU A&B COMBO - Normal  POCT RESPIRATORY SYNCYTIAL VIRUS    EKG   Radiology DG Chest 2 View Result Date: 08/15/2024 CLINICAL DATA:  Coughing and shortness of breath. EXAM: CHEST - 2 VIEW COMPARISON:  Chest PA Lat 09/24/2023 FINDINGS: The heart size and mediastinal contours are within normal limits. The lungs hyperinflated. There are mild chronic interstitial changes in the bases. No focal pneumonia is evident, no pleural effusion. The visualized skeletal structures are unchanged. There is a mild chronic wedge compression fracture of the T12 vertebral body. Thoracic spondylosis. IMPRESSION: No evidence of acute chest disease. Hyperinflated lungs with mild chronic interstitial changes in the bases. Stable chest with chronic change. Electronically Signed   By: Francis Quam M.D.   On: 08/15/2024 20:32    Procedures Procedures (including critical care time)  Medications Ordered in UC Medications  dexamethasone  (DECADRON ) injection 10 mg (10 mg Intramuscular Given 08/15/24 2043)    Initial Impression / Assessment and Plan / UC Course  I have reviewed the triage vital signs and the nursing notes.  Pertinent labs & imaging results that were available during my care of the patient were reviewed by me and considered in my medical decision making (see chart for details).     Acute cough - Plan: POC Covid19/Flu A&B Antigen, DG Chest 2 View, POC Covid19/Flu A&B Antigen, DG Chest 2 View, POC RSV Antigen (for pts less than 38yrs or greater than 47yrs), POC RSV Antigen (for pts less than 88yrs or greater than 82yrs)  Shortness of breath - Plan: POC Covid19/Flu A&B Antigen, DG Chest 2 View, POC Covid19/Flu A&B Antigen, DG Chest 2 View, POC RSV Antigen (for pts less than 76yrs or greater than 36yrs), POC RSV Antigen  (for pts less than 81yrs or greater than 49yrs)  Viral respiratory infection   Flu A, flu B, COVID and RSV testing done today.  These are negative.  Chest x-ray done today.  Final evaluation by the radiologist does not show any acute abnormalities. Symptoms are most consistent with a viral infection as they have only been going on for 3 days.  This does not require antibiotic treatment.  We focus treatment on improving the symptoms.  We will treat with the following:  Decadron  injection given today. This is a steroid to help with inflammation in the airways Benzonatate  (tessalon ) 100 mg every 8 hours as needed for cough.  Continue Albuterol  inhaler 1-2 puffs every 6 hours as needed for wheezing/shortness of breath.  Use caution as this medication can cause an increase in heart  rate Make sure to stay hydrated by drinking plenty of water. Return to urgent care or PCP if symptoms worsen or fail to resolve.  Especially if symptoms last longer than 5 days to 7 days or if you develop worsening shortness of breath, fevers or chest pain.  Final Clinical Impressions(s) / UC Diagnoses   Final diagnoses:  Acute cough  Shortness of breath  Viral respiratory infection     Discharge Instructions      Flu A, flu B, COVID and RSV testing done today.  These are negative.  Chest x-ray done today.  Final evaluation by the radiologist does not show any acute abnormalities. Symptoms are most consistent with a viral infection as they have only been going on for 3 days.  This does not require antibiotic treatment.  We focus treatment on improving the symptoms.  We will treat with the following:  Decadron  injection given today. This is a steroid to help with inflammation in the airways Benzonatate  (tessalon ) 100 mg every 8 hours as needed for cough.  Continue Albuterol  inhaler 1-2 puffs every 6 hours as needed for wheezing/shortness of breath.  Use caution as this medication can cause an increase in heart  rate Make sure to stay hydrated by drinking plenty of water. Return to urgent care or PCP if symptoms worsen or fail to resolve.  Especially if symptoms last longer than 5 days to 7 days or if you develop worsening shortness of breath, fevers or chest pain.     ED Prescriptions     Medication Sig Dispense Auth. Provider   benzonatate  (TESSALON ) 100 MG capsule Take 1 capsule (100 mg total) by mouth every 8 (eight) hours. 21 capsule Teresa Almarie LABOR, [place]      PDMP not reviewed this encounter.   Teresa Almarie LABOR [place] 08/15/24 2042    Teresa Almarie LABOR, PA-C 08/15/24 2043    Teresa Almarie LABOR, PA-C 08/15/24 2043

## 2024-08-15 NOTE — Discharge Instructions (Addendum)
 Flu A, flu B, COVID and RSV testing done today.  These are negative.  Chest x-ray done today.  Final evaluation by the radiologist does not show any acute abnormalities. Symptoms are most consistent with a viral infection as they have only been going on for 3 days.  This does not require antibiotic treatment.  We focus treatment on improving the symptoms.  We will treat with the following:  Decadron  injection given today. This is a steroid to help with inflammation in the airways Benzonatate  (tessalon ) 100 mg every 8 hours as needed for cough.  Continue Albuterol  inhaler 1-2 puffs every 6 hours as needed for wheezing/shortness of breath.  Use caution as this medication can cause an increase in heart rate Make sure to stay hydrated by drinking plenty of water. Return to urgent care or PCP if symptoms worsen or fail to resolve.  Especially if symptoms last longer than 5 days to 7 days or if you develop worsening shortness of breath, fevers or chest pain.

## 2024-08-16 ENCOUNTER — Other Ambulatory Visit: Payer: Self-pay | Admitting: Internal Medicine

## 2024-08-16 DIAGNOSIS — R809 Proteinuria, unspecified: Secondary | ICD-10-CM

## 2024-08-16 DIAGNOSIS — E034 Atrophy of thyroid (acquired): Secondary | ICD-10-CM

## 2024-08-16 MED ORDER — TRULICITY 3 MG/0.5ML ~~LOC~~ SOAJ: 3.0000 mg | SUBCUTANEOUS | 0 refills | Status: AC

## 2024-08-16 MED ORDER — TOUJEO SOLOSTAR 300 UNIT/ML ~~LOC~~ SOPN: 50.0000 [IU] | PEN_INJECTOR | Freq: Every day | SUBCUTANEOUS | 6 refills | Status: AC

## 2024-08-16 MED ORDER — EZETIMIBE 10 MG PO TABS: 10.0000 mg | ORAL_TABLET | Freq: Every day | ORAL | 0 refills | Status: DC

## 2024-08-16 MED ORDER — LEVOTHYROXINE SODIUM 50 MCG PO TABS: 50.0000 ug | ORAL_TABLET | Freq: Every day | ORAL | 1 refills | Status: AC

## 2024-08-16 NOTE — Telephone Encounter (Unsigned)
 Copied from CRM [id number]. Topic: Clinical - Medication Refill >> Aug 16, 2024  9:23 AM Charlet HERO wrote: Medication:insulin  glargine, 1 Unit Dial, (TOUJEO  SOLOSTAR) 300 UNIT/ML Solostar Pen levothyroxine  (SYNTHROID ) 50 MCG tablet TRULICITY  0.75 MG/0.5ML SOAJ  ezetimibe  (ZETIA ) 10 MG tablet   Has the patient contacted their pharmacy? Yes Franky from [employer] calling in the refill  This is the patient's preferred pharmacy:   [facility] - [place], [place] - [address] [place] [address] Phone: [phone] Fax: [phone]  Is this the correct pharmacy for this prescription? Yes If no, delete pharmacy and type the correct one.   Has the prescription been filled recently? Yes  Is the patient out of the medication? Yes  Has the patient been seen for an appointment in the last year OR does the patient have an upcoming appointment? Yes  Can we respond through MyChart? No  Agent: Please be advised that Rx refills may take up to 3 business days. We ask that you follow-up with your pharmacy.

## 2024-08-17 ENCOUNTER — Other Ambulatory Visit: Payer: Self-pay | Admitting: Family Medicine

## 2024-08-17 DIAGNOSIS — R809 Proteinuria, unspecified: Secondary | ICD-10-CM

## 2024-08-21 ENCOUNTER — Other Ambulatory Visit: Payer: Self-pay | Admitting: Cardiology

## 2024-08-21 DIAGNOSIS — I5022 Chronic systolic (congestive) heart failure: Secondary | ICD-10-CM

## 2024-08-21 DIAGNOSIS — E781 Pure hyperglyceridemia: Secondary | ICD-10-CM

## 2024-08-22 ENCOUNTER — Other Ambulatory Visit: Payer: Self-pay | Admitting: Cardiology

## 2024-08-22 DIAGNOSIS — I5042 Chronic combined systolic (congestive) and diastolic (congestive) heart failure: Secondary | ICD-10-CM

## 2024-08-23 ENCOUNTER — Ambulatory Visit
Admission: RE | Admit: 2024-08-23 | Discharge: 2024-08-23 | Disposition: A | Discharge status: Home / Self Care | Source: Ambulatory Visit | Attending: Internal Medicine | Admitting: Internal Medicine

## 2024-08-23 ENCOUNTER — Other Ambulatory Visit: Payer: Self-pay

## 2024-08-23 DIAGNOSIS — R202 Paresthesia of skin: Secondary | ICD-10-CM

## 2024-08-24 ENCOUNTER — Other Ambulatory Visit: Payer: Self-pay

## 2024-08-24 ENCOUNTER — Encounter: Payer: Self-pay | Admitting: *Deleted

## 2024-08-24 ENCOUNTER — Ambulatory Visit
Admission: EM | Admit: 2024-08-24 | Discharge: 2024-08-24 | Disposition: A | Discharge status: Home / Self Care | Attending: Nurse Practitioner | Admitting: Nurse Practitioner

## 2024-08-24 DIAGNOSIS — J206 Acute bronchitis due to rhinovirus: Secondary | ICD-10-CM | POA: Diagnosis not present

## 2024-08-24 MED ORDER — IPRATROPIUM-ALBUTEROL 0.5-2.5 (3) MG/3ML IN SOLN
3.0000 mL | Freq: Once | RESPIRATORY_TRACT | Status: AC
  Administered 2024-08-24: 3 mL via RESPIRATORY_TRACT

## 2024-08-24 MED ORDER — MUCINEX DM MAXIMUM STRENGTH 60-1200 MG PO TB12: 1.0000 | ORAL_TABLET | Freq: Two times a day (BID) | ORAL | 0 refills | Status: DC

## 2024-08-24 MED ORDER — AZITHROMYCIN 250 MG PO TABS: 250.0000 mg | ORAL_TABLET | Freq: Every day | ORAL | 0 refills | Status: DC

## 2024-08-24 MED ORDER — AMOXICILLIN-POT CLAVULANATE 875-125 MG PO TABS: 1.0000 | ORAL_TABLET | Freq: Two times a day (BID) | ORAL | 0 refills | Status: DC

## 2024-08-24 MED ORDER — PREDNISONE 20 MG PO TABS: 40.0000 mg | ORAL_TABLET | Freq: Every day | ORAL | 0 refills | Status: AC

## 2024-08-24 NOTE — ED Provider Notes (Signed)
 [facility]    CSN: [id number] Arrival date & time: 08/24/24  0808      History   Chief Complaint Chief Complaint  Patient presents with   Cough    HPI Felicia Hanson is a 68 y.o. female.   Discussed the use of AI scribe software for clinical note transcription with the patient, who gave verbal consent to proceed.   Patient presents for follow-up of persistent congestion after a recent visit on September 16th for cough and shortness of breath, diagnosed as a viral respiratory infection. The patient reports that while the cough has resolved, congestion remains, particularly in the throat, which is difficult to cough up.  The patient states that the congestion has not completely cleared since the last visit. They have been using cough medicine as needed. Occasional wheezing at night is reported, but no shortness of breath, fever, sore throat, sneezing, runny nose, or nausea. The patient experienced a single episode of diarrhea after not eating for a couple of days but reports a good appetite overall. The patient discloses significant psychosocial stressors, including depression and marital issues, which have led to increased crying that exacerbates the congestion. They express concern about the congestion potentially developing into pneumonia. The patient admits to smoking, having almost quit previously but increasing usage due to stress. They prefer smoking over increasing Xanax  use due to family history of addiction. The patient reports taking extra-strength Tylenol  for pain and is attempting to quit smoking again.  The following sections of the patient's history were reviewed and updated as appropriate: allergies, current medications, past family history, past medical history, past social history, past surgical history, and problem list.     Past Medical History:  Diagnosis Date   Allergy    Anxiety    CHF (congestive heart failure) (HCC) 01/20/2021   COPD (chronic  obstructive pulmonary disease) (HCC)    Coronary artery calcification    Depression    Diabetes mellitus without complication (HCC)    Diverticulitis    with colon resection   GERD (gastroesophageal reflux disease)    on pepcid   Hyperlipidemia    Hypertension    Sleep apnea    cpap on order 01-01-22   Sleep paralysis    will be tested for narcolepsy per pt 01-01-22   Thyroid  disease     Patient Active Problem List   Diagnosis Date Noted   Skin lesion of face 11/18/2023   Routine general medical examination at a health care facility 11/18/2023   MDD (major depressive disorder), recurrent episode 03/03/2023   Benign essential tremor 05/25/2022   Loud snoring 03/24/2021   Excessive daytime sleepiness 03/24/2021   Panlobular emphysema (HCC) 03/24/2021   GAD (generalized anxiety disorder) 03/24/2021   Chronic combined systolic and diastolic CHF (congestive heart failure) (HCC) 01/20/2021   Type 2 diabetes with complication (HCC) 10/26/2017   Vitamin D  deficiency 01/10/2014   Essential hypertension, benign 01/10/2014   Hyperlipidemia associated with type 2 diabetes mellitus (HCC) 01/10/2014   Hypothyroidism 01/10/2014    Past Surgical History:  Procedure Laterality Date   BREAST EXCISIONAL BIOPSY Left    CESAREAN SECTION     x2   CHOLECYSTECTOMY     COLON SURGERY     diverticulitis with perforation; s/p colon resection.   Colonoscopy     COLONOSCOPY     DENTAL SURGERY     RIGHT/LEFT HEART CATH AND CORONARY ANGIOGRAPHY N/A 01/22/2021   Procedure: RIGHT/LEFT HEART CATH AND CORONARY ANGIOGRAPHY;  Surgeon: Ladona Heinz, MD;  Location: [facility] INVASIVE CV LAB;  Service: Cardiovascular;  Laterality: N/A;   TUBAL LIGATION      OB History   No obstetric history on file.      Home Medications    Prior to Admission medications   Medication Sig Start Date End Date Taking? Authorizing Provider  amoxicillin -clavulanate (AUGMENTIN ) 875-125 MG tablet Take 1 tablet by mouth 2 (two) times  daily after a meal. 08/24/24  Yes Iola Lukes, FNP  azithromycin  (ZITHROMAX ) 250 MG tablet Take 1 tablet (250 mg total) by mouth daily. Take first 2 tablets together, then 1 every day until finished. 08/24/24  Yes Iola Lukes, FNP  Dextromethorphan-guaiFENesin  (MUCINEX  DM MAXIMUM STRENGTH) 60-1200 MG TB12 Take 1 tablet by mouth 2 (two) times daily. 08/24/24  Yes Meggie Laseter, FNP  predniSONE  (DELTASONE ) 20 MG tablet Take 2 tablets (40 mg total) by mouth daily for 5 days. 08/24/24 08/29/24 Yes Iola Lukes, FNP  ACCU-CHEK AVIVA PLUS test strip daily. for testing 03/11/18   [provider]  acetaminophen  (TYLENOL ) 325 MG tablet Take 650 mg by mouth every 6 (six) hours as needed.    [provider]  albuterol  (VENTOLIN  HFA) 108 (90 Base) MCG/ACT inhaler Inhale 1 puff into the lungs every 6 (six) hours as needed for wheezing or shortness of breath.    [provider]  ALPRAZolam  (XANAX ) 1 MG tablet Take 1 mg by mouth 3 (three) times daily. 02/11/24   [provider]  aspirin  81 MG tablet Take 81 mg by mouth daily.    [provider]  benzonatate  (TESSALON ) 100 MG capsule Take 1 capsule (100 mg total) by mouth every 8 (eight) hours. 08/15/24   White, Elizabeth A, PA-C  BLACK ELDERBERRY PO Take 10 mLs by mouth daily.    [provider]  cetirizine  (ZYRTEC ) 10 MG tablet Take 10 mg by mouth daily.    [provider]  dapagliflozin  propanediol (FARXIGA ) 10 MG TABS tablet Take 1 tablet (10 mg total) by mouth daily. 07/28/24   Tolia, Sunit, DO  Dulaglutide  (TRULICITY ) 3 MG/0.5ML SOAJ Inject 3 mg as directed once a week. 08/16/24   Webb, Padonda B, FNP  DULoxetine  (CYMBALTA ) 60 MG capsule TAKE 2 CAPSULES BY MOUTH DAILY 02/17/23   Levora Reyes SAUNDERS, MD  ezetimibe  (ZETIA ) 10 MG tablet Take 1 tablet (10 mg total) by mouth daily. 08/16/24   Rollene Almarie LABOR, MD  famotidine (PEPCID) 10 MG tablet Take 10 mg by mouth daily.    [provider]  fenofibrate  (TRICOR ) 145 MG tablet Take 1 tablet (145 mg total) by mouth daily. 07/28/24   Tolia, Sunit, DO  fluticasone  (FLONASE ) 50 MCG/ACT nasal spray Place 2 sprays into both nostrils daily. 03/15/22   Leath-Warren, Etta PARAS, NP  insulin  glargine, 1 Unit Dial, (TOUJEO  SOLOSTAR) 300 UNIT/ML Solostar Pen Inject 50 Units into the skin daily. 08/16/24   Webb, Padonda B, FNP  Insulin  Pen Needle 31G X 5 MM MISC Inject 1 each into the skin daily. 02/10/21   Greene, Jeffrey R, MD  ipratropium (ATROVENT ) 0.03 % nasal spray Place 2 sprays into both nostrils 3 (three) times daily as needed for rhinitis. 04/23/23   Arloa Suzen RAMAN, NP  levothyroxine  (SYNTHROID ) 50 MCG tablet Take 1 tablet (50 mcg total) by mouth daily. 08/16/24   Rollene Almarie LABOR, MD  magnesium  oxide (MAG-OX) 400 (240 Mg) MG tablet TAKE 1/2 TABLET (200 MG TOTAL) BY MOUTH TWICE DAILY 08/22/24   Michele,  Sunit, DO  metoprolol  succinate (TOPROL -XL) 100 MG 24 hr tablet Take 1 tablet (100 mg total) by mouth daily. Take with or immediately following a meal. 07/28/24   Tolia, Sunit, DO  nitroGLYCERIN  (NITROSTAT ) 0.4 MG SL tablet Place 1 tablet (0.4 mg total) under the tongue every 5 (five) minutes as needed for chest pain. 07/28/24   Tolia, Sunit, DO  pravastatin  (PRAVACHOL ) 80 MG tablet TAKE 1 TABLET BY MOUTH EVERY DAY IN THE EVENING 12/06/23   Levora Reyes SAUNDERS, MD  Probiotic Product (PROBIOTIC BLEND PO) Take 1 capsule by mouth daily.    [provider]  sacubitril -valsartan  (ENTRESTO ) 97-103 MG Take 1 tablet by mouth 2 (two) times daily. 07/28/24   Tolia, Sunit, DO  spironolactone  (ALDACTONE ) 25 MG tablet Take 1 tablet (25 mg total) by mouth daily. 02/17/23   Levora Reyes SAUNDERS, MD  topiramate  (TOPAMAX ) 50 MG tablet Take 1 tablet (50 mg total) by mouth 2 (two) times daily. 07/24/24   Leigh Venetia CROME, MD  vitamin B-12 (CYANOCOBALAMIN ) 100 MCG tablet Take 1,000 mcg by mouth daily.    [provider]  Vitamin D ,  Ergocalciferol , (DRISDOL ) 1.25 MG (50000 UNIT) CAPS capsule Take 1 capsule (50,000 Units total) by mouth every 7 (seven) days. 11/15/23   Rollene Almarie LABOR, MD    Family History Family History  Problem Relation Age of Onset   Colon polyps Mother    Cancer Mother        unknown primary   Hyperlipidemia Mother    Other Father    Breast cancer Paternal Aunt    Esophageal cancer Paternal Uncle    Colon cancer Neg Hx    Rectal cancer Neg Hx    Stomach cancer Neg Hx     Social History Social History   Tobacco Use   Smoking status: Every Day    Current packs/day: 0.25    Average packs/day: 0.3 packs/day for 38.0 years (9.5 ttl pk-yrs)    Types: Cigarettes    Passive exposure: Current   Smokeless tobacco: Never   Tobacco comments:    2-5 A DAY  Vaping Use   Vaping status: Never Used  Substance Use Topics   Alcohol use: Not Currently   Drug use: Never     Allergies   Metformin and related, Nicotine , and Ace inhibitors   Review of Systems Review of Systems  Constitutional:  Negative for appetite change and fever.  HENT:  Negative for congestion, rhinorrhea, sneezing and sore throat.   Respiratory:  Positive for cough (hard to cough up any sputum but can feel it in the chest) and wheezing (sometimes at night when laying down). Negative for shortness of breath.        Chest congestion   Gastrointestinal:  Negative for diarrhea, nausea and vomiting.  Psychiatric/Behavioral:  Positive for sleep disturbance. Negative for suicidal ideas. The patient is nervous/anxious (more depressed lately; a lot going on at home).   All other systems reviewed and are negative.    Physical Exam Triage Vital Signs ED Triage Vitals  Encounter Vitals Group     BP 08/24/24 0839 138/84     Girls Systolic BP Percentile --      Girls Diastolic BP Percentile --      Boys Systolic BP Percentile --      Boys Diastolic BP Percentile --      Pulse Rate 08/24/24 0839 81     Resp 08/24/24 0839  18     Temp 08/24/24 0839  98.4 F (36.9 C)     Temp Source 08/24/24 0839 Oral     SpO2 08/24/24 0839 95 %     Weight --      Height --      Head Circumference --      Peak Flow --      Pain Score 08/24/24 0841 0     Pain Loc --      Pain Education --      Exclude from Growth Chart --    No data found.  Updated Vital Signs BP 138/84 (BP Location: Left Arm)   Pulse 81   Temp 98.4 F (36.9 C) (Oral)   Resp 18   SpO2 95%   Visual Acuity Right Eye Distance:   Left Eye Distance:   Bilateral Distance:    Right Eye Near:   Left Eye Near:    Bilateral Near:     Physical Exam Vitals reviewed.  Constitutional:      General: She is awake. She is not in acute distress.    Appearance: Normal appearance. She is well-developed. She is not ill-appearing, toxic-appearing or diaphoretic.  HENT:     Head: Normocephalic.     Right Ear: Tympanic membrane, ear canal and external ear normal. No drainage, swelling or tenderness. No middle ear effusion. Tympanic membrane is not erythematous.     Left Ear: Tympanic membrane, ear canal and external ear normal. No drainage, swelling or tenderness.  No middle ear effusion. Tympanic membrane is not erythematous.     Nose: Congestion present. No rhinorrhea.     Mouth/Throat:     Lips: Pink.     Mouth: Mucous membranes are moist.     Pharynx: No pharyngeal swelling, oropharyngeal exudate, posterior oropharyngeal erythema or uvula swelling.     Tonsils: No tonsillar exudate or tonsillar abscesses.  Eyes:     General: Vision grossly intact.     Conjunctiva/sclera: Conjunctivae normal.  Cardiovascular:     Rate and Rhythm: Normal rate.     Pulses: Normal pulses.     Heart sounds: Normal heart sounds.  Pulmonary:     Effort: Pulmonary effort is normal. No tachypnea or respiratory distress.     Breath sounds: Normal air entry. Examination of the right-lower field reveals rhonchi. Examination of the left-lower field reveals rhonchi. Rhonchi  present. No decreased breath sounds.     Comments: Respirations even and unlabored  Musculoskeletal:        General: Normal range of motion.     Cervical back: Normal range of motion and neck supple.     Right lower leg: No edema.     Left lower leg: No edema.  Lymphadenopathy:     Cervical: No cervical adenopathy.  Skin:    General: Skin is warm and dry.  Neurological:     General: No focal deficit present.     Mental Status: She is alert and oriented to person, place, and time.  Psychiatric:        Behavior: Behavior is cooperative.      UC Treatments / Results  Labs (all labs ordered are listed, but only abnormal results are displayed) Labs Reviewed - No data to display  EKG   Radiology No results found.  Procedures Procedures (including critical care time)  Medications Ordered in UC Medications  ipratropium-albuterol  (DUONEB) 0.5-2.5 (3) MG/3ML nebulizer solution 3 mL (3 mLs Nebulization Given 08/24/24 0934)    Initial Impression / Assessment and Plan / UC Course  I have reviewed the triage vital signs and the nursing notes.  Pertinent labs & imaging results that were available during my care of the patient were reviewed by me and considered in my medical decision making (see chart for details).     Patient presents with persistent congestion following a viral respiratory infection diagnosed on 08/15/24. Initial cough and shortness of breath have resolved, but throat congestion continues with occasional nocturnal wheezing and difficulty clearing mucus. No fever, sore throat, sneezing, rhinorrhea, or gastrointestinal symptoms reported. Exam shows diffuse rhonchi at the bases, respirations even and unlabored, afebrile, vital signs stable, and no acute distress. Prior chest X-ray on 9/16 demonstrated chronic changes likely related to smoking history but no acute infiltrates; repeat imaging is unavailable today. Given ongoing symptoms and exam findings, differential  includes bronchitis versus pneumonia, and patient will be treated empirically. In-office DuoNeb treatment provided. Prescriptions include azithromycin , amoxicillin -clavulanate, prednisone , Mucinex  DM twice daily, and continuation of Tessalon  and albuterol  MDI as needed. Patient was advised to monitor symptoms closely, follow up with PCP as soon as possible for reassessment, and consider repeat chest X-ray if symptoms fail to improve. Emergency precautions reviewed, including worsening shortness of breath, chest pain, high fever, or inability to tolerate oral intake.  Today's evaluation has revealed no signs of a dangerous process. Discussed diagnosis with patient and/or guardian. Patient and/or guardian aware of their diagnosis, possible red flag symptoms to watch out for and need for close follow up. Patient and/or guardian understands verbal and written discharge instructions. Patient and/or guardian comfortable with plan and disposition.  Patient and/or guardian has a clear mental status at this time, good insight into illness (after discussion and teaching) and has clear judgment to make decisions regarding their care  Documentation was completed with the aid of voice recognition software. Transcription may contain typographical errors.   Final Clinical Impressions(s) / UC Diagnoses   Final diagnoses:  Acute bronchitis due to Rhinovirus     Discharge Instructions      You were seen today for ongoing congestion that started after your recent respiratory infection. Your cough and shortness of breath have improved, but you still have congestion with some wheezing at night and difficulty clearing mucus. Your exam shows some congestion in your lungs but no signs of severe illness at this time. Because your symptoms have persisted, we are treating you for possible bronchitis or pneumonia. In the office you received a breathing treatment to help open your airways. You have been prescribed antibiotics,  prednisone  to reduce inflammation, and Mucinex  DM twice a day to loosen mucus and make it easier to clear. Continue to use your cough medicine and albuterol  inhaler as needed. Drink plenty of fluids to help thin mucus and rest as much as possible. Using a cool mist humidifier at home or inhaling steam from a shower may also help relieve congestion and ease breathing. Please call your primary care provider today to schedule a follow-up appointment so your progress can be checked and a repeat chest X-ray can be considered if you are not improving. Go to the emergency department right away if you develop sudden or worsening shortness of breath, chest pain, high fever, dizziness, confusion, or if you are unable to keep down fluids or medications.      ED Prescriptions     Medication Sig Dispense Auth. Provider   amoxicillin -clavulanate (AUGMENTIN ) 875-125 MG tablet Take 1 tablet by mouth 2 (two) times daily after a meal. 14 tablet Iola Lukes, FNP  Dextromethorphan-guaiFENesin  (MUCINEX  DM MAXIMUM STRENGTH) 60-1200 MG TB12 Take 1 tablet by mouth 2 (two) times daily. 20 tablet Iola Lukes, FNP   azithromycin  (ZITHROMAX ) 250 MG tablet Take 1 tablet (250 mg total) by mouth daily. Take first 2 tablets together, then 1 every day until finished. 6 tablet Iola Lukes, FNP   predniSONE  (DELTASONE ) 20 MG tablet Take 2 tablets (40 mg total) by mouth daily for 5 days. 10 tablet Iola Lukes, FNP      PDMP not reviewed this encounter.   Iola Lukes, [place] 08/24/24 1010

## 2024-08-24 NOTE — Discharge Instructions (Addendum)
 You were seen today for ongoing congestion that started after your recent respiratory infection. Your cough and shortness of breath have improved, but you still have congestion with some wheezing at night and difficulty clearing mucus. Your exam shows some congestion in your lungs but no signs of severe illness at this time. Because your symptoms have persisted, we are treating you for possible bronchitis or pneumonia. In the office you received a breathing treatment to help open your airways. You have been prescribed antibiotics, prednisone  to reduce inflammation, and Mucinex  DM twice a day to loosen mucus and make it easier to clear. Continue to use your cough medicine and albuterol  inhaler as needed. Drink plenty of fluids to help thin mucus and rest as much as possible. Using a cool mist humidifier at home or inhaling steam from a shower may also help relieve congestion and ease breathing. Please call your primary care provider today to schedule a follow-up appointment so your progress can be checked and a repeat chest X-ray can be considered if you are not improving. Go to the emergency department right away if you develop sudden or worsening shortness of breath, chest pain, high fever, dizziness, confusion, or if you are unable to keep down fluids or medications.

## 2024-08-24 NOTE — ED Triage Notes (Signed)
 Pt seen here on the 9/16 for cough and congestion. States the cough is better but I still feel congestion in me. States she was advised to return if her symptoms didn't get better.

## 2024-08-25 ENCOUNTER — Other Ambulatory Visit: Payer: Self-pay | Admitting: Family Medicine

## 2024-08-25 ENCOUNTER — Telehealth: Payer: Self-pay

## 2024-08-25 DIAGNOSIS — E034 Atrophy of thyroid (acquired): Secondary | ICD-10-CM

## 2024-08-25 NOTE — Telephone Encounter (Signed)
 Copied from CRM [id number]. Topic: Clinical - Medication Question >> Aug 25, 2024  4:13 PM Alfonso ORN wrote: Reason for CRM: Pt called back regarding rx (thyroid ) request. Advised 3 bsd for request. Pt is concerned and wants to know if it is safe to go that long without thyroid  information. Pt requesting call back for guidance.  P: [phone]

## 2024-08-25 NOTE — Telephone Encounter (Signed)
 Medication request was sent to the wrong location. Forwarding to [employer]

## 2024-08-26 ENCOUNTER — Other Ambulatory Visit: Payer: Self-pay | Admitting: Family Medicine

## 2024-08-26 DIAGNOSIS — E034 Atrophy of thyroid (acquired): Secondary | ICD-10-CM

## 2024-08-28 ENCOUNTER — Ambulatory Visit: Admitting: Behavioral Health

## 2024-08-28 ENCOUNTER — Ambulatory Visit: Admitting: Neurology

## 2024-08-28 ENCOUNTER — Encounter: Payer: Self-pay | Admitting: Neurology

## 2024-08-28 ENCOUNTER — Other Ambulatory Visit: Payer: Self-pay | Admitting: Internal Medicine

## 2024-08-28 NOTE — Telephone Encounter (Signed)
 Called patient in regards to this concern but call was unsuccessful to mailbox being full

## 2024-08-29 ENCOUNTER — Other Ambulatory Visit: Payer: Self-pay | Admitting: Cardiology

## 2024-08-29 ENCOUNTER — Telehealth: Payer: Self-pay | Admitting: Internal Medicine

## 2024-08-29 ENCOUNTER — Ambulatory Visit (INDEPENDENT_AMBULATORY_CARE_PROVIDER_SITE_OTHER): Admitting: Behavioral Health

## 2024-08-29 DIAGNOSIS — F331 Major depressive disorder, recurrent, moderate: Secondary | ICD-10-CM | POA: Diagnosis not present

## 2024-08-29 DIAGNOSIS — F411 Generalized anxiety disorder: Secondary | ICD-10-CM | POA: Diagnosis not present

## 2024-08-29 DIAGNOSIS — I5022 Chronic systolic (congestive) heart failure: Secondary | ICD-10-CM

## 2024-08-29 NOTE — Telephone Encounter (Signed)
 Copied from CRM [id number]. Topic: General - Other >> Aug 29, 2024 11:03 AM [place] A wrote: Reason for CRM: Patient called and said that she had appt with [place] [facility] and that the front office person was very mean to her because she did not understand how to work the video appointment.

## 2024-08-29 NOTE — Progress Notes (Addendum)
 [facility] Counselor/Therapist Progress Note  Patient ID: Felicia Hanson, MRN: [id number],    Date: 08/29/2024  Time Spent: 45 min Caregility video; Pt is @ home w/privacy & Provider working remotely from [employer]. Pt is aware of the risks/limitations of telehealth & consents to Tx today. Time In: 11:00am Time Out: 11:45am  Pt understands if she misses an addt'l appt w/o proper notice to the [facility] WR Office she will be dismissed from the Practice due to 7 cancellations/NSH status dates in 2025.  Treatment Type: Individual Therapy  Reported Symptoms: Elevated anx/dep & stressors w/Family  Mental Status Exam: Appearance:  Casual     Behavior: Defensive & timid about her needs  Motor: Normal  Speech/Language:  Clear and Coherent  Affect: Emot'l & feeling sick today; recovery from pneumo  Mood: anxious, depressed, and labile  Thought process: flight of ideas  Thought content:   Rumination  Sensory/Perceptual disturbances:   WNL  Orientation: oriented to person, place, time/date, and situation  Attention: Good  Concentration: Pt is scattered & cannot focus  Memory: WNL  Fund of knowledge:  Fair  Insight:   Fair  Judgment:  Good  Impulse Control: Good   Risk Assessment: Danger to Self:  No Self-injurious Behavior: No Danger to Others: No Duty to Warn:no Physical Aggression / Violence:No  Access to Firearms a concern: No  Gang Involvement:No   Subjective: Pt is very lonely & having many relational issues w/her Husb & Family. She does not have much support. Her health status changes have been stressful. Her Husb has been very lonely & even requested her Husb lay w/her recently as they sleep separate. He will speak to her & forbid her to cry when upset. He is not supportive of her emot'l needs. She does not want to die like this in the home.   Pt has leaned into her faith system to pray & find solace.   Pt has been frustrated w/home life & cannot resolve the  situation for herself.   Pt will get up & move, even if she is in pain.   Pt will cont to connect w/her good female friend Donley. He is funny & listens to Pt & her concerns. They cont to speak to ea other regularly.     Interventions: Psycho-education/Bibliotherapy and Insight-Oriented  Diagnosis:GAD (generalized anxiety disorder)  Moderate episode of recurrent major depressive disorder (HCC)  Plan: Laria will fortify herself more w/her belief system, relying into her faith more strongly. She is trying to be open to G_d & give him the burdens she deals with daily. She lays her worries @ the [place] & leaves them there. She is extremely lonely & will use the suggestions today to lessen these feelings. She will make a commitment to herself to be consistent w/psychotherapy appts. She will cont to write in her Notebook.  Target Date:09/28/2024  Progress:4  Frequency: Once monthly  Modality: Kennis Richerd LITTIE Hollace, LMFT

## 2024-08-31 NOTE — Telephone Encounter (Signed)
 Pt received prescription on 9/17

## 2024-09-05 ENCOUNTER — Ambulatory Visit: Admitting: Internal Medicine

## 2024-09-06 ENCOUNTER — Other Ambulatory Visit: Payer: Self-pay | Admitting: Internal Medicine

## 2024-09-06 NOTE — Telephone Encounter (Unsigned)
 Copied from CRM #[id number]. Topic: Clinical - Medication Refill >> Sep 06, 2024  3:41 PM Berneda F wrote: Medication:  ACCU-CHEK AVIVA PLUS test strip   [facility] pharmacy is requsting all of the diabetic supplies including the glucometer, lancets, and lancing device, did not have the name   Has the patient contacted their pharmacy? Yes (Agent: If no, request that the patient contact the pharmacy for the refill. If patient does not wish to contact the pharmacy document the reason why and proceed with request.) (Agent: If yes, when and what did the pharmacy advise?)  This is the patient's preferred pharmacy:  [facility] mail in pharmacy [address], [place] [address] Phone number [phone] Fax-[phone] or alternate fax [phone]  Is this the correct pharmacy for this prescription? Yes If no, delete pharmacy and type the correct one.   Has the prescription been filled recently? No  Is the patient out of the medication? No  Has the patient been seen for an appointment in the last year OR does the patient have an upcoming appointment? Yes  Can we respond through MyChart? Yes  Agent: Please be advised that Rx refills may take up to 3 business days. We ask that you follow-up with your pharmacy.

## 2024-09-07 MED ORDER — ACCU-CHEK AVIVA PLUS VI STRP: 1.0000 | ORAL_STRIP | Freq: Every day | 2 refills | Status: AC

## 2024-09-08 ENCOUNTER — Telehealth: Payer: Self-pay | Admitting: Internal Medicine

## 2024-09-08 NOTE — Telephone Encounter (Signed)
 Copied from CRM [id number]. Topic: Clinical - Prescription Issue >> Sep 08, 2024  2:04 PM Suzen RAMAN wrote: Reason for CRM: Juliene from [facility] pharmacy is requsting all of the diabetic supplies including the glucometer, lancets, and lancing device this request was included in 09/06/24 request but ACCU-CHEK AVIVA PLUS test strip was the only thing sent to the pharmacy.

## 2024-09-08 NOTE — Telephone Encounter (Signed)
 Copied from CRM [id number]. Topic: Clinical - Medication Question >> Sep 08, 2024  2:00 PM Anairis L wrote: Reason for CRM: Adam from [facility] pharmacy calling in regard to clarification on Diabetic supply glucose meter, test strip and lancet. [phone]   Need new script old script only had test strips.A new corrected script will be faxed.

## 2024-09-11 ENCOUNTER — Ambulatory Visit: Admitting: Behavioral Health

## 2024-09-11 NOTE — Telephone Encounter (Unsigned)
 Copied from CRM [id number]. Topic: Clinical - Prescription Issue >> Sep 11, 2024 10:24 AM Darshell M wrote: Reason for CRM: Juliene from [facility] pharmacy is requsting all of the diabetic supplies including the glucometer, lancets, and lancing device. The prescription they received was only test strips. Prescription needs to be amended to include the glucometer, lancets, and lancing device and test strips.  Prescription request was sent from [facility] to clinic via fax on Friday 10/10. CB# [phone], Fax# [phone] or [phone].

## 2024-09-11 NOTE — Progress Notes (Deleted)
 [facility] Counselor/Therapist Progress Note  Patient ID: Felicia Hanson, MRN: [id number],    Date: 09/11/2024  Time Spent: ***   Time In: 2:00pm Time Out: 2/:45pm  Treatment Type: {CHL AMB THERAPY TYPES:[id number]}  Reported Symptoms: ***  Mental Status Exam: Appearance:  {PSY:22683}     Behavior: {PSY:21022743}  Motor: {PSY:22302}  Speech/Language:  {PSY:22685}  Affect: {PSY:22687}  Mood: {PSY:31886}  Thought process: {PSY:31888}  Thought content:   {PSY:802 449 7935}  Sensory/Perceptual disturbances:   {PSY:760-098-7921}  Orientation: {PSY:30297}  Attention: {PSY:22877}  Concentration: {PSY:785 435 2678}  Memory: {PSY:818-179-8621}  Fund of knowledge:  {PSY:785 435 2678}  Insight:   {PSY:785 435 2678}  Judgment:  {PSY:785 435 2678}  Impulse Control: {PSY:785 435 2678}   Risk Assessment: Danger to Self:  {PSY:22692} Self-injurious Behavior: {PSY:22692} Danger to Others: {PSY:22692} Duty to Warn:{PSY:311194} Physical Aggression / Violence:{PSY:21197} Access to Firearms a concern: {PSY:21197} Gang Involvement:{PSY:21197}  Subjective: ***   Interventions: {PSY:250-043-3680}  Diagnosis:Moderate episode of recurrent major depressive disorder (HCC)  GAD (generalized anxiety disorder)  Loneliness  Plan: ***  Target Date:  Progress:  Frequency:  Modality:   [name], LMFT

## 2024-09-11 NOTE — Telephone Encounter (Signed)
 Forms completed and faxed, confirmation fax received

## 2024-09-11 NOTE — Telephone Encounter (Signed)
 Forms received, given to pcp for completion/signature

## 2024-09-13 ENCOUNTER — Telehealth: Payer: Self-pay | Admitting: Behavioral Health

## 2024-09-13 NOTE — Telephone Encounter (Signed)
 Pt missed scheduled appt w/Clinican on Mon, Oct 13th, 2025. Requested Pt RC to [facility] & let Admin Staff know the times Clinician can contact her to discuss this missed appt after our last discussion about [facility] Policy & Late Cancelations.   Dr. Richerd Ling, PhD, LMFT

## 2024-09-15 ENCOUNTER — Ambulatory Visit: Admitting: Internal Medicine

## 2024-09-25 ENCOUNTER — Ambulatory Visit: Admitting: Internal Medicine

## 2024-10-02 ENCOUNTER — Telehealth: Payer: Self-pay

## 2024-10-02 NOTE — Telephone Encounter (Signed)
 Call pt in regards to PAP on Lilly Cares Trulicity  and [employer]i Toujeo  pt is coming up due for re-enrollment left a HIPAA VM.

## 2024-10-03 ENCOUNTER — Ambulatory Visit: Admitting: Internal Medicine

## 2024-10-03 ENCOUNTER — Encounter: Payer: Self-pay | Admitting: Internal Medicine

## 2024-10-03 VITALS — BP 130/80 | HR 78 | Temp 98.4°F | Ht 60.0 in | Wt 160.0 lb

## 2024-10-03 DIAGNOSIS — E1129 Type 2 diabetes mellitus with other diabetic kidney complication: Secondary | ICD-10-CM | POA: Diagnosis not present

## 2024-10-03 DIAGNOSIS — E781 Pure hyperglyceridemia: Secondary | ICD-10-CM

## 2024-10-03 DIAGNOSIS — Z23 Encounter for immunization: Secondary | ICD-10-CM | POA: Diagnosis not present

## 2024-10-03 DIAGNOSIS — F331 Major depressive disorder, recurrent, moderate: Secondary | ICD-10-CM

## 2024-10-03 DIAGNOSIS — E7800 Pure hypercholesterolemia, unspecified: Secondary | ICD-10-CM | POA: Diagnosis not present

## 2024-10-03 DIAGNOSIS — J431 Panlobular emphysema: Secondary | ICD-10-CM

## 2024-10-03 DIAGNOSIS — I10 Essential (primary) hypertension: Secondary | ICD-10-CM

## 2024-10-03 DIAGNOSIS — R809 Proteinuria, unspecified: Secondary | ICD-10-CM

## 2024-10-03 DIAGNOSIS — I5042 Chronic combined systolic (congestive) and diastolic (congestive) heart failure: Secondary | ICD-10-CM

## 2024-10-03 DIAGNOSIS — E1169 Type 2 diabetes mellitus with other specified complication: Secondary | ICD-10-CM

## 2024-10-03 DIAGNOSIS — E785 Hyperlipidemia, unspecified: Secondary | ICD-10-CM

## 2024-10-03 DIAGNOSIS — Z794 Long term (current) use of insulin: Secondary | ICD-10-CM

## 2024-10-03 DIAGNOSIS — F418 Other specified anxiety disorders: Secondary | ICD-10-CM

## 2024-10-03 DIAGNOSIS — E118 Type 2 diabetes mellitus with unspecified complications: Secondary | ICD-10-CM

## 2024-10-03 MED ORDER — DULOXETINE HCL 60 MG PO CPEP: 120.0000 mg | ORAL_CAPSULE | Freq: Every day | ORAL | 1 refills | Status: AC

## 2024-10-03 MED ORDER — SPIRONOLACTONE 25 MG PO TABS: 25.0000 mg | ORAL_TABLET | Freq: Every day | ORAL | 1 refills | Status: AC

## 2024-10-03 MED ORDER — PRAVASTATIN SODIUM 80 MG PO TABS: 80.0000 mg | ORAL_TABLET | Freq: Every day | ORAL | 3 refills | Status: AC

## 2024-10-03 MED ORDER — FENOFIBRATE 145 MG PO TABS: 145.0000 mg | ORAL_TABLET | Freq: Every day | ORAL | 3 refills | Status: AC

## 2024-10-03 MED ORDER — EZETIMIBE 10 MG PO TABS: 10.0000 mg | ORAL_TABLET | Freq: Every day | ORAL | 0 refills | Status: AC

## 2024-10-03 NOTE — Patient Instructions (Signed)
 We will check the labs today.

## 2024-10-03 NOTE — Progress Notes (Unsigned)
   Subjective:   Patient ID: Felicia Hanson, female    DOB: December 25, 1955, 68 y.o.   MRN: [id number]  Discussed the use of AI scribe software for clinical note transcription with the patient, who gave verbal consent to proceed.  History of Present Illness Felicia Hanson is a 68 year old female who presents with depressive symptoms and recent respiratory infection.  She experiences ongoing depressive symptoms, exacerbated by her living situation with her husband, whom she describes as a 'narcissist.' She feels trapped and spiritually drained, expressing significant emotional distress. Additionally, she suffers from seasonal affective disorder, which worsens during the winter months due to decreased daylight and colder temperatures.  She recently experienced a respiratory infection lasting a couple of weeks, likely due to frequent contact with her grandchildren. She takes precautions such as washing her hands and wearing a mask in stores to prevent infections. No new chest pains or breathing troubles are reported.  She mentions a recent skin tear on her arm caused by closing a car door, which bled significantly. She has been applying antibiotic ointment and using a non-stick dressing. The area remains sore.  She is currently taking B1 supplements due to a low B1 level. She expresses concern about potential brain damage but reports no cognitive issues, and states that a previous MRI of her brain was normal.  She has received her flu shot and is in the process of settling a lawsuit, which she hopes will alleviate some stress. She is also engaged in writing activities, including memoirs for her children, which she finds fulfilling.  Review of Systems  Objective:  Physical Exam  Vitals:   10/03/24 1538  BP: 130/80  Pulse: 78  Temp: 98.4 F (36.9 C)  TempSrc: Oral  SpO2: 99%  Weight: 160 lb (72.6 kg)  Height: 5' (1.524 m)    Assessment and Plan Assessment & Plan Depression   Chronic  depression is exacerbated by personal stressors and seasonal affective disorder. She engages in therapeutic activities like writing.  Skin tear, right lower extremity   A recent skin tear on the right lower extremity is not infected, with regrowing skin. Continue antibiotic ointment application and cover the wound with a dressing.  Type 2 diabetes mellitus   Monitoring with an A1c test to assess control. Perform A1c test.  General Health Maintenance   She received a flu vaccination. Hand hygiene and mask use were discussed to prevent infections. Continue hand hygiene and mask use in public.

## 2024-10-04 ENCOUNTER — Ambulatory Visit: Admitting: Behavioral Health

## 2024-10-05 ENCOUNTER — Ambulatory Visit: Admitting: Neurology

## 2024-10-06 NOTE — Assessment & Plan Note (Signed)
 Checking CMP and continue current regimen. Adjust as needed.

## 2024-10-06 NOTE — Assessment & Plan Note (Signed)
 Checking HgA1c and adjust as needed.

## 2024-10-06 NOTE — Assessment & Plan Note (Signed)
 Checking lipid panel and adjust as needed.

## 2024-10-06 NOTE — Assessment & Plan Note (Signed)
 Taking cymbalta  and overall stable but living situation is not ideal. She is considering seeking counseling.

## 2024-10-06 NOTE — Assessment & Plan Note (Signed)
 Has some stable SOB on exertion and using albuterol  inhaler as needed.

## 2024-10-09 ENCOUNTER — Telehealth: Payer: Self-pay

## 2024-10-09 NOTE — Telephone Encounter (Unsigned)
 Copied from CRM [id number]. Topic: General - Other >> Oct 09, 2024 12:51 PM Nessti S wrote: Reason for CRM: pt called because she wanted to see if papers she gave pcp was filled so husband can come pick it up and have it faxed. Call back number [phone] husband [phone]

## 2024-10-11 NOTE — Telephone Encounter (Signed)
 Spoke with patient. Husband is not at home will contact me when he returns.

## 2024-10-18 ENCOUNTER — Ambulatory Visit: Admitting: Behavioral Health

## 2024-10-18 NOTE — Progress Notes (Deleted)
 [facility] Counselor/Therapist Progress Note  Patient ID: Felicia Hanson, MRN: [id number],    Date: 10/18/2024  Time Spent: 45 min Caregility video; Pt is home in private & Provider working remotely from [employer]. Pt is aware of the risks/limitations of telehealth & consents to Tx today. Time In: 1:00pm Time Out: 1:45pm   Treatment Type: Individual Therapy  Reported Symptoms: ***  Mental Status Exam: Appearance:  {PSY:22683}     Behavior: {PSY:21022743}  Motor: {PSY:22302}  Speech/Language:  {PSY:22685}  Affect: {PSY:22687}  Mood: {PSY:31886}  Thought process: {PSY:31888}  Thought content:   {PSY:480-558-5977}  Sensory/Perceptual disturbances:   {PSY:608 034 4300}  Orientation: {PSY:30297}  Attention: {PSY:22877}  Concentration: {PSY:212-887-0054}  Memory: {PSY:367 760 6993}  Fund of knowledge:  {PSY:212-887-0054}  Insight:   {PSY:212-887-0054}  Judgment:  {PSY:212-887-0054}  Impulse Control: {PSY:212-887-0054}   Risk Assessment: Danger to Self:  {PSY:22692} Self-injurious Behavior: {PSY:22692} Danger to Others: {PSY:22692} Duty to Warn:{PSY:311194} Physical Aggression / Violence:{PSY:21197} Access to Firearms a concern: {PSY:21197} Gang Involvement:{PSY:21197}  Subjective: ***   Interventions: {PSY:([phone])007-0751}  Diagnosis:Moderate episode of recurrent major depressive disorder (HCC)  GAD (generalized anxiety disorder)  Loneliness  Plan: ***  Target Date:  Progress:  Frequency:  Modality:   Felicia LITTIE Ling, LMFT

## 2024-10-19 ENCOUNTER — Ambulatory Visit: Admitting: Dermatology

## 2024-11-01 ENCOUNTER — Ambulatory Visit: Admitting: Behavioral Health

## 2024-11-02 ENCOUNTER — Telehealth: Payer: Self-pay

## 2024-11-02 NOTE — Telephone Encounter (Signed)
 Copied from CRM [id number]. Topic: Clinical - Request for Lab/Test Order >> Nov 01, 2024  4:17 PM Alexandria E wrote: Reason for CRM: Patient is needing lab work done, order states for labs needs to be collected. Advised by CAL that agent could not schedule unless it states future. Please change status of orders when available. Patient is hoping to have this completed around Christmas.

## 2024-11-03 NOTE — Addendum Note (Signed)
 Addended by: EZZARD EDSEL HERO on: 11/03/2024 03:35 PM   Modules accepted: Orders

## 2024-11-03 NOTE — Telephone Encounter (Signed)
 Ok to replace for future with same dx codes

## 2024-11-03 NOTE — Telephone Encounter (Signed)
 Labs changed to future. Per DPR left detailed message for pt confirming request.

## 2024-11-06 ENCOUNTER — Encounter: Admitting: Neurology

## 2024-11-10 ENCOUNTER — Telehealth: Payer: Self-pay | Admitting: Cardiology

## 2024-11-10 ENCOUNTER — Telehealth: Payer: Self-pay | Admitting: Internal Medicine

## 2024-11-10 MED ORDER — SACUBITRIL-VALSARTAN 97-103 MG PO TABS: 1.0000 | ORAL_TABLET | Freq: Two times a day (BID) | ORAL | 0 refills | Status: DC

## 2024-11-10 NOTE — Telephone Encounter (Signed)
 Returned call to pt re:  refill request.  Looks like several has been denied and pt was out.  Pt was advised that she was 1 year overdue to see Dr. Michele.  I reached out to Dr. Michele, he had a 24 hour new pt slot for Monday, 11/13/24 that he gave me permission to use.  Pt accepted, Entresto  refill sent for a 15 day supply.  Pt was overly appreciative of the call and help to get her in.

## 2024-11-10 NOTE — Telephone Encounter (Signed)
°*  STAT* If patient is at the pharmacy, call can be transferred to refill team.   1. Which medications need to be refilled? (please list name of each medication and dose if known)  sacubitril -valsartan  (ENTRESTO ) 97-103 MG   2. Would you like to learn more about the convenience, safety, & potential cost savings by using the [facility]? No  3. Are you open to using the [facility] (Type [facility]. no   4. Which pharmacy/location (including street and city if local pharmacy) is medication to be sent to? [id number] - [place], [place] - [address]   5. Do they need a 30 day or 90 day supply? 30 day     Pt completely out.

## 2024-11-10 NOTE — Telephone Encounter (Signed)
 Patient dropped off document Patient Assistance Application, to be filled out by provider. Patient requested to send it back via Call Patient to pick up within 7-days. Document is located in providers tray at front office.Please advise at [phone]

## 2024-11-13 ENCOUNTER — Other Ambulatory Visit: Payer: Self-pay | Admitting: Internal Medicine

## 2024-11-13 ENCOUNTER — Ambulatory Visit: Admitting: Cardiology

## 2024-11-13 NOTE — Telephone Encounter (Signed)
 Paperwork that was dropped off is incomplete. Contacted pt and spoke to her along with husband. Husband stated that he would re-pring forms and bring back to the office.

## 2024-11-13 NOTE — Telephone Encounter (Signed)
 Call pt for 2nd time no answer mail out pap Lilly Cares(Trulicity ) and [employer]i(Toujeo ), faxed provider portion.
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
## 2024-11-13 NOTE — Telephone Encounter (Signed)
 Awaiting correct forms. Pt and husband are aware.

## 2024-11-14 ENCOUNTER — Ambulatory Visit: Admitting: Cardiology

## 2024-11-14 ENCOUNTER — Other Ambulatory Visit: Payer: Self-pay | Admitting: Internal Medicine

## 2024-11-14 DIAGNOSIS — R7989 Other specified abnormal findings of blood chemistry: Secondary | ICD-10-CM

## 2024-11-17 ENCOUNTER — Ambulatory Visit: Payer: Self-pay | Admitting: Neurology

## 2024-11-21 ENCOUNTER — Telehealth: Payer: Self-pay | Admitting: Neurology

## 2024-11-21 NOTE — Telephone Encounter (Signed)
 We are writing to inform you that [facility], including all providers within this practice, will no longer be able to provide medical care to you.  This decision is the result of repeated missed appointments without adequate notice, which has disrupted our ability provide timely and effective care to all patients. 11/17/24

## 2024-11-21 NOTE — Telephone Encounter (Signed)
 Gave pt a call pt has not return call, left a HIPAA VM  send a mychart msg.

## 2024-11-28 ENCOUNTER — Telehealth: Payer: Self-pay

## 2024-11-28 NOTE — Telephone Encounter (Signed)
 Copied from CRM [id number]. Topic: Clinical - Medication Question >> Nov 28, 2024  2:38 PM Amber H wrote: Reason for CRM: Patient stated she is out of her B-12 (CYANOCOBALAMIN ) 100 MCG tablet- this medication was not prescribed by Dr. Rollene. She stated her neurologist pcd it to her but she has not seen him in a while. I looked in chart review and saw where she was dismissed from neuro care due to missed appts. She wanted to know if Dr. Rollene could pcd her this medicaion. I did advise her message will be sent however, she also needs to reach out to neuro for med refill. If It can be filled please send to [id number] - [place], [place] - [address]  [place] [phone]

## 2024-12-01 ENCOUNTER — Other Ambulatory Visit

## 2024-12-01 DIAGNOSIS — Z794 Long term (current) use of insulin: Secondary | ICD-10-CM

## 2024-12-01 DIAGNOSIS — E1129 Type 2 diabetes mellitus with other diabetic kidney complication: Secondary | ICD-10-CM

## 2024-12-01 DIAGNOSIS — R809 Proteinuria, unspecified: Secondary | ICD-10-CM

## 2024-12-01 LAB — COMPREHENSIVE METABOLIC PANEL WITH GFR
ALT: 8 U/L (ref 3–35)
AST: 17 U/L (ref 5–37)
Albumin: 3.6 g/dL (ref 3.5–5.2)
Alkaline Phosphatase: 77 U/L (ref 39–117)
BUN: 18 mg/dL (ref 6–23)
CO2: 29 meq/L (ref 19–32)
Calcium: 9.3 mg/dL (ref 8.4–10.5)
Chloride: 106 meq/L (ref 96–112)
Creatinine, Ser: 1.47 mg/dL — ABNORMAL HIGH (ref 0.40–1.20)
GFR: 36.09 mL/min — ABNORMAL LOW
Glucose, Bld: 100 mg/dL — ABNORMAL HIGH (ref 70–99)
Potassium: 3.9 meq/L (ref 3.5–5.1)
Sodium: 142 meq/L (ref 135–145)
Total Bilirubin: 0.3 mg/dL (ref 0.2–1.2)
Total Protein: 6.6 g/dL (ref 6.0–8.3)

## 2024-12-01 LAB — HEMOGLOBIN A1C: Hgb A1c MFr Bld: 6 % (ref 4.6–6.5)

## 2024-12-01 LAB — CBC
HCT: 42.1 % (ref 36.0–46.0)
Hemoglobin: 14 g/dL (ref 12.0–15.0)
MCHC: 33.2 g/dL (ref 30.0–36.0)
MCV: 90.8 fl (ref 78.0–100.0)
Platelets: 191 K/uL (ref 150.0–400.0)
RBC: 4.64 Mil/uL (ref 3.87–5.11)
RDW: 13.1 % (ref 11.5–15.5)
WBC: 9.7 K/uL (ref 4.0–10.5)

## 2024-12-01 LAB — LIPID PANEL
Cholesterol: 117 mg/dL (ref 28–200)
HDL: 47.3 mg/dL
LDL Cholesterol: 39 mg/dL (ref 10–99)
NonHDL: 70.02
Total CHOL/HDL Ratio: 2
Triglycerides: 154 mg/dL — ABNORMAL HIGH (ref 10.0–149.0)
VLDL: 30.8 mg/dL (ref 0.0–40.0)

## 2024-12-01 LAB — MICROALBUMIN / CREATININE URINE RATIO
Creatinine,U: 78.5 mg/dL
Microalb Creat Ratio: 17.3 mg/g (ref 0.0–30.0)
Microalb, Ur: 1.4 mg/dL (ref 0.7–1.9)

## 2024-12-01 LAB — VITAMIN B12: Vitamin B-12: 508 pg/mL (ref 211–911)

## 2024-12-04 NOTE — Telephone Encounter (Signed)
 Pt is aware.

## 2024-12-04 NOTE — Telephone Encounter (Signed)
 This is otc and no prescription needed she can get herself.

## 2024-12-06 ENCOUNTER — Ambulatory Visit: Payer: Self-pay | Admitting: Internal Medicine

## 2024-12-06 ENCOUNTER — Ambulatory Visit: Admitting: Behavioral Health

## 2024-12-07 ENCOUNTER — Ambulatory Visit (INDEPENDENT_AMBULATORY_CARE_PROVIDER_SITE_OTHER)

## 2024-12-07 DIAGNOSIS — N951 Menopausal and female climacteric states: Secondary | ICD-10-CM | POA: Diagnosis not present

## 2024-12-07 DIAGNOSIS — Z0000 Encounter for general adult medical examination without abnormal findings: Secondary | ICD-10-CM

## 2024-12-07 DIAGNOSIS — Z1231 Encounter for screening mammogram for malignant neoplasm of breast: Secondary | ICD-10-CM

## 2024-12-07 NOTE — Progress Notes (Addendum)
 "  Chief Complaint  Patient presents with   Medicare Wellness     Subjective:   Felicia Hanson is a 69 y.o. female who presents for a Medicare Annual Wellness Visit.  Visit info / Clinical Intake: Medicare Wellness Visit Type:: Subsequent Annual Wellness Visit Persons participating in visit and providing information:: patient Medicare Wellness Visit Mode:: Telephone If telephone:: video declined Since this visit was completed virtually, some vitals may be partially provided or unavailable. Missing vitals are due to the limitations of the virtual format.: Unable to obtain vitals - no equipment If Telephone or Video please confirm:: I connected with patient using audio/video enable telemedicine. I verified patient identity with two identifiers, discussed telehealth limitations, and patient agreed to proceed. Patient Location:: Home Provider Location:: Home Interpreter Needed?: No Pre-visit prep was completed: yes AWV questionnaire completed by patient prior to visit?: no Living arrangements:: lives with spouse/significant other Patient's Overall Health Status Rating: (!) fair Typical amount of pain: none Does pain affect daily life?: no Are you currently prescribed opioids?: no  Dietary Habits and Nutritional Risks How many meals a day?: 2 Eats fruit and vegetables daily?: yes Most meals are obtained by: preparing own meals In the last 2 weeks, have you had any of the following?: none Diabetic:: (!) yes Any non-healing wounds?: no How often do you check your BS?: 1; as needed Would you like to be referred to a Nutritionist or for Diabetic Management? : no  Functional Status Activities of Daily Living (to include ambulation/medication): Independent Ambulation: Independent Medication Administration: Independent Home Management (perform basic housework or laundry): Independent Manage your own finances?: yes Primary transportation is: driving Concerns about vision?: (!)  yes Concerns about hearing?: no  Fall Screening Falls in the past year?: 0 Number of falls in past year: 0 Was there an injury with Fall?: 0 Fall Risk Category Calculator: 0 Patient Fall Risk Level: Low Fall Risk  Fall Risk Patient at Risk for Falls Due to: No Fall Risks Fall risk Follow up: Falls evaluation completed; Education provided; Falls prevention discussed  Home and Transportation Safety: All rugs have non-skid backing?: (!) no All stairs or steps have railings?: N/A, no stairs Grab bars in the bathtub or shower?: (!) no Have non-skid surface in bathtub or shower?: yes Good home lighting?: yes Regular seat belt use?: yes Hospital stays in the last year:: no  Cognitive Assessment Difficulty concentrating, remembering, or making decisions? : no Will 6CIT or Mini Cog be Completed: yes What year is it?: 0 points What month is it?: 0 points Give patient an address phrase to remember (5 components): [address] [place] About what time is it?: 0 points Count backwards from 20 to 1: 0 points Say the months of the year in reverse: 0 points Repeat the address phrase from earlier: 0 points 6 CIT Score: 0 points  Advance Directives (For Healthcare) Does Patient Have a Medical Advance Directive?: No    Allergies (verified) Metformin and related, Nicotine , and Ace inhibitors   Current Medications (verified) Outpatient Encounter Medications as of 12/07/2024  Medication Sig   ACCU-CHEK AVIVA PLUS test strip 1 each by Other route daily. for testing   acetaminophen  (TYLENOL ) 325 MG tablet Take 650 mg by mouth every 6 (six) hours as needed.   albuterol  (VENTOLIN  HFA) 108 (90 Base) MCG/ACT inhaler Inhale 1 puff into the lungs every 6 (six) hours as needed for wheezing or shortness of breath.   ALPRAZolam  (XANAX ) 1 MG tablet Take 1  mg by mouth 3 (three) times daily.   aspirin  81 MG tablet Take 81 mg by mouth daily.   BLACK ELDERBERRY PO Take 10 mLs by mouth daily.   cetirizine   (ZYRTEC ) 10 MG tablet Take 10 mg by mouth daily.   dapagliflozin  propanediol (FARXIGA ) 10 MG TABS tablet Take 1 tablet (10 mg total) by mouth daily.   Dulaglutide  (TRULICITY ) 3 MG/0.5ML SOAJ Inject 3 mg as directed once a week.   DULoxetine  (CYMBALTA ) 60 MG capsule Take 2 capsules (120 mg total) by mouth daily.   ezetimibe  (ZETIA ) 10 MG tablet Take 1 tablet (10 mg total) by mouth daily.   famotidine (PEPCID) 10 MG tablet Take 10 mg by mouth daily.   fenofibrate  (TRICOR ) 145 MG tablet Take 1 tablet (145 mg total) by mouth daily.   fluticasone  (FLONASE ) 50 MCG/ACT nasal spray Place 2 sprays into both nostrils daily.   insulin  glargine, 1 Unit Dial, (TOUJEO  SOLOSTAR) 300 UNIT/ML Solostar Pen Inject 50 Units into the skin daily.   Insulin  Pen Needle 31G X 5 MM MISC Inject 1 each into the skin daily.   ipratropium (ATROVENT ) 0.03 % nasal spray Place 2 sprays into both nostrils 3 (three) times daily as needed for rhinitis.   levothyroxine  (SYNTHROID ) 50 MCG tablet Take 1 tablet (50 mcg total) by mouth daily.   magnesium  oxide (MAG-OX) 400 (240 Mg) MG tablet TAKE 1/2 TABLET (200 MG TOTAL) BY MOUTH TWICE DAILY   metoprolol  succinate (TOPROL -XL) 100 MG 24 hr tablet Take 1 tablet (100 mg total) by mouth daily. Take with or immediately following a meal.   nitroGLYCERIN  (NITROSTAT ) 0.4 MG SL tablet Place 1 tablet (0.4 mg total) under the tongue every 5 (five) minutes as needed for chest pain.   pravastatin  (PRAVACHOL ) 80 MG tablet Take 1 tablet (80 mg total) by mouth daily.   Probiotic Product (PROBIOTIC BLEND PO) Take 1 capsule by mouth daily.   sacubitril -valsartan  (ENTRESTO ) 97-103 MG Take 1 tablet by mouth 2 (two) times daily.   spironolactone  (ALDACTONE ) 25 MG tablet Take 1 tablet (25 mg total) by mouth daily.   topiramate  (TOPAMAX ) 50 MG tablet Take 1 tablet (50 mg total) by mouth 2 (two) times daily.   TRULICITY  0.75 MG/0.5ML SOAJ INJECT 0.75MG  (0.5ML) UNDER THE SKIN ONCE A WEEK.   vitamin B-12  (CYANOCOBALAMIN ) 100 MCG tablet Take 1,000 mcg by mouth daily.   Vitamin D , Ergocalciferol , (DRISDOL ) 1.25 MG (50000 UNIT) CAPS capsule TAKE 1 CAPSULE BY MOUTH EVERY 7 DAYS   No facility-administered encounter medications on file as of 12/07/2024.    History: Past Medical History:  Diagnosis Date   Allergy    Anxiety    CHF (congestive heart failure) (HCC) 01/20/2021   COPD (chronic obstructive pulmonary disease) (HCC)    Coronary artery calcification    Depression    Diabetes mellitus without complication (HCC)    Diverticulitis    with colon resection   GERD (gastroesophageal reflux disease)    on pepcid   Hyperlipidemia    Hypertension    Sleep apnea    cpap on order 01-01-22   Sleep paralysis    will be tested for narcolepsy per pt 01-01-22   Thyroid  disease    Past Surgical History:  Procedure Laterality Date   BREAST EXCISIONAL BIOPSY Left    CESAREAN SECTION     x2   CHOLECYSTECTOMY     COLON SURGERY     diverticulitis with perforation; s/p colon resection.   Colonoscopy  COLONOSCOPY     DENTAL SURGERY     RIGHT/LEFT HEART CATH AND CORONARY ANGIOGRAPHY N/A 01/22/2021   Procedure: RIGHT/LEFT HEART CATH AND CORONARY ANGIOGRAPHY;  Surgeon: Ladona Heinz, MD;  Location: [facility];  Service: Cardiovascular;  Laterality: N/A;   TUBAL LIGATION     Family History  Problem Relation Age of Onset   Colon polyps Mother    Cancer Mother        unknown primary   Hyperlipidemia Mother    Other Father    Breast cancer Paternal Aunt    Esophageal cancer Paternal Uncle    Colon cancer Neg Hx    Rectal cancer Neg Hx    Stomach cancer Neg Hx    Social History   Occupational History   Occupation: Retired  Tobacco Use   Smoking status: Every Day    Current packs/day: 0.25    Average packs/day: 0.3 packs/day for 38.0 years (9.5 ttl pk-yrs)    Types: Cigarettes    Passive exposure: Current   Smokeless tobacco: Never   Tobacco comments:    2-5 A DAY  Vaping Use    Vaping status: Never Used  Substance and Sexual Activity   Alcohol use: Not Currently   Drug use: Never   Sexual activity: Not Currently   Tobacco Counseling Ready to quit: Not Answered Counseling given: Not Answered Tobacco comments: 2-5 A DAY  SDOH Screenings   Food Insecurity: No Food Insecurity (12/07/2024)  Housing: Low Risk (12/07/2024)  Transportation Needs: No Transportation Needs (12/07/2024)  Utilities: Not At Risk (12/07/2024)  Alcohol Screen: Low Risk (06/17/2022)  Depression (PHQ2-9): Low Risk (12/07/2024)  Financial Resource Strain: High Risk (03/30/2024)  Physical Activity: Inactive (12/07/2024)  Social Connections: Moderately Integrated (12/07/2024)  Stress: No Stress Concern Present (12/07/2024)  Tobacco Use: High Risk (10/03/2024)  Health Literacy: Adequate Health Literacy (12/07/2024)   See flowsheets for full screening details  Depression Screen PHQ 2 & 9 Depression Scale- Over the past 2 weeks, how often have you been bothered by any of the following problems? Little interest or pleasure in doing things: 0 Feeling down, depressed, or hopeless (PHQ Adolescent also includes...irritable): 0 PHQ-2 Total Score: 0 Trouble falling or staying asleep, or sleeping too much: 3 Feeling tired or having little energy: 1 Poor appetite or overeating (PHQ Adolescent also includes...weight loss): 0 Feeling bad about yourself - or that you are a failure or have let yourself or your family down: 0 Trouble concentrating on things, such as reading the newspaper or watching television (PHQ Adolescent also includes...like school work): 0 Moving or speaking so slowly that other people could have noticed. Or the opposite - being so fidgety or restless that you have been moving around a lot more than usual: 0 Thoughts that you would be better off dead, or of hurting yourself in some way: 0 PHQ-9 Total Score: 7 If you checked off any problems, how difficult have these problems made it for you to do  your work, take care of things at home, or get along with other people?: Somewhat difficult  Depression Treatment Depression Interventions/Treatment : Currently on Treatment     Goals Addressed             This Visit's Progress    Just to keep breathing.               Objective:    There were no vitals filed for this visit. There is no height or weight on file to  calculate BMI.  Hearing/Vision screen No results found. Immunizations and Health Maintenance Health Maintenance  Topic Date Due   Zoster Vaccines- Shingrix (1 of 2) Never done   Pneumococcal Vaccine: 50+ Years (2 of 2 - PCV) 11/15/2018   Bone Density Scan  Never done   Mammogram  04/17/2021   Medicare Annual Wellness (AWV)  06/18/2023   OPHTHALMOLOGY EXAM  10/20/2023   COVID-19 Vaccine (4 - 2025-26 season) 07/31/2024   HEMOGLOBIN A1C  05/31/2025   FOOT EXAM  10/03/2025   Diabetic kidney evaluation - eGFR measurement  12/01/2025   Diabetic kidney evaluation - Urine ACR  12/01/2025   Colonoscopy  09/12/2031   DTaP/Tdap/Td (3 - Td or Tdap) 02/02/2033   Influenza Vaccine  Completed   Hepatitis C Screening  Completed   Meningococcal B Vaccine  Aged Out        Assessment/Plan:  This is a routine wellness examination for Taylin.  Patient Care Team: Rollene Almarie LABOR, MD as PCP - General (Internal Medicine) Michele Richardson, DO as PCP - Cardiology (Cardiology)  I have personally reviewed and noted the following in the patients chart:   Medical and social history Use of alcohol, tobacco or illicit drugs  Current medications and supplements including opioid prescriptions. Functional ability and status Nutritional status Physical activity Advanced directives List of other physicians Hospitalizations, surgeries, and ER visits in previous 12 months Vitals Screenings to include cognitive, depression, and falls Referrals and appointments  No orders of the defined types were placed in this  encounter.  In addition, I have reviewed and discussed with patient certain preventive protocols, quality metrics, and best practice recommendations. A written personalized care plan for preventive services as well as general preventive health recommendations were provided to patient.   Arnette LOISE Hoots, CMA   12/07/2024   No follow-ups on file.  After Visit Summary: (Declined) Due to this being a telephonic visit, with patients personalized plan was offered to patient but patient Declined AVS at this time   Nurse Notes: Patient seems to be having issues with her husband since he had a stroke. She states that she does not need resources that her therapist has helped her.  "

## 2024-12-07 NOTE — Patient Instructions (Signed)
 Ms. Manzer,  Thank you for taking the time for your Medicare Wellness Visit. I appreciate your continued commitment to your health goals. Please review the care plan we discussed, and feel free to reach out if I can assist you further.  Please note that Annual Wellness Visits do not include a physical exam. Some assessments may be limited, especially if the visit was conducted virtually. If needed, we may recommend an in-person follow-up with your provider.  Ongoing Care Seeing your primary care provider every 3 to 6 months helps us  monitor your health and provide consistent, personalized care.   Referrals If a referral was made during today's visit and you haven't received any updates within two weeks, please contact the referred provider directly to check on the status.  Recommended Screenings:  Health Maintenance  Topic Date Due   Zoster (Shingles) Vaccine (1 of 2) Never done   Pneumococcal Vaccine for age over 71 (2 of 2 - PCV) 11/15/2018   Osteoporosis screening with Bone Density Scan  Never done   Breast Cancer Screening  04/17/2021   Medicare Annual Wellness Visit  06/18/2023   Eye exam for diabetics  10/20/2023   COVID-19 Vaccine (4 - 2025-26 season) 07/31/2024   Hemoglobin A1C  05/31/2025   Complete foot exam   10/03/2025   Yearly kidney function blood test for diabetes  12/01/2025   Yearly kidney health urinalysis for diabetes  12/01/2025   Colon Cancer Screening  09/12/2031   DTaP/Tdap/Td vaccine (3 - Td or Tdap) 02/02/2033   Flu Shot  Completed   Hepatitis C Screening  Completed   Meningitis B Vaccine  Aged Out       01/17/2024    4:35 PM  Advanced Directives  Does Patient Have a Medical Advance Directive? No    Vision: Annual vision screenings are recommended for early detection of glaucoma, cataracts, and diabetic retinopathy. These exams can also reveal signs of chronic conditions such as diabetes and high blood pressure.  Dental: Annual dental screenings help  detect early signs of oral cancer, gum disease, and other conditions linked to overall health, including heart disease and diabetes.  Please see the attached documents for additional preventive care recommendations.

## 2024-12-08 NOTE — Addendum Note (Signed)
 Addended by: Kesean Serviss, NIKKI N on: 12/08/2024 07:50 AM   Modules accepted: Orders

## 2024-12-11 ENCOUNTER — Encounter: Payer: Self-pay | Admitting: Internal Medicine

## 2024-12-11 ENCOUNTER — Ambulatory Visit: Admitting: Internal Medicine

## 2024-12-11 VITALS — BP 138/40 | HR 74 | Temp 97.8°F | Ht 60.0 in | Wt 161.0 lb

## 2024-12-11 DIAGNOSIS — I5042 Chronic combined systolic (congestive) and diastolic (congestive) heart failure: Secondary | ICD-10-CM | POA: Diagnosis not present

## 2024-12-11 DIAGNOSIS — E118 Type 2 diabetes mellitus with unspecified complications: Secondary | ICD-10-CM | POA: Diagnosis not present

## 2024-12-11 DIAGNOSIS — Z7984 Long term (current) use of oral hypoglycemic drugs: Secondary | ICD-10-CM | POA: Diagnosis not present

## 2024-12-11 DIAGNOSIS — E519 Thiamine deficiency, unspecified: Secondary | ICD-10-CM | POA: Diagnosis not present

## 2024-12-11 DIAGNOSIS — Z7985 Long-term (current) use of injectable non-insulin antidiabetic drugs: Secondary | ICD-10-CM

## 2024-12-11 DIAGNOSIS — J431 Panlobular emphysema: Secondary | ICD-10-CM

## 2024-12-11 DIAGNOSIS — G250 Essential tremor: Secondary | ICD-10-CM | POA: Diagnosis not present

## 2024-12-11 NOTE — Assessment & Plan Note (Signed)
 Needs new neurologist and referral done today.

## 2024-12-11 NOTE — Assessment & Plan Note (Signed)
 No flare today and on farxiga  and spironolactone  and entresto . Continue and recent labs normal.

## 2024-12-11 NOTE — Assessment & Plan Note (Signed)
 Reassurance given that B12 levels normal recently. Rechecking B1 levels today. Adjust as needed taking oral otc.

## 2024-12-11 NOTE — Assessment & Plan Note (Signed)
 Recent levels reviewed and stable. Continue farxiga  and trulicity . Is on statin and ACE-I/ARB.

## 2024-12-11 NOTE — Progress Notes (Signed)
" ° °  Subjective:   Patient ID: Felicia Hanson, female    DOB: 02-Feb-1956, 69 y.o.   MRN: [id number]  Discussed the use of AI scribe software for clinical note transcription with the patient, who gave verbal consent to proceed. History of Present Illness Felicia Hanson is a 69 year old female who presents for a neurology consultation and follow-up on vitamin B levels.  She has a history of low vitamin B1 and B12 levels. She is currently taking over-the-counter B1 and B12 supplements. Her B12 levels were recently rechecked and found to be within the normal range, but the B1 level was not rechecked at that time. She was also seeing neurology for tremor  Review of Systems  Constitutional: Negative.   HENT: Negative.    Eyes: Negative.   Respiratory:  Negative for cough, chest tightness and shortness of breath.   Cardiovascular:  Negative for chest pain, palpitations and leg swelling.  Gastrointestinal:  Negative for abdominal distention, abdominal pain, constipation, diarrhea, nausea and vomiting.  Musculoskeletal: Negative.   Skin: Negative.   Neurological: Negative.   Psychiatric/Behavioral: Negative.      Objective:  Physical Exam Constitutional:      Appearance: She is well-developed.  HENT:     Head: Normocephalic and atraumatic.  Cardiovascular:     Rate and Rhythm: Normal rate and regular rhythm.  Pulmonary:     Effort: Pulmonary effort is normal. No respiratory distress.     Breath sounds: Normal breath sounds. No wheezing or rales.  Abdominal:     General: Bowel sounds are normal. There is no distension.     Palpations: Abdomen is soft.     Tenderness: There is no abdominal tenderness.  Musculoskeletal:     Cervical back: Normal range of motion.  Skin:    General: Skin is warm and dry.  Neurological:     Mental Status: She is alert and oriented to person, place, and time.     Coordination: Coordination normal.     Vitals:   12/11/24 1524  BP: (!) 138/40  Pulse: 74   Temp: 97.8 F (36.6 C)  SpO2: 100%  Weight: 161 lb (73 kg)  Height: 5' (1.524 m)    "

## 2024-12-11 NOTE — Assessment & Plan Note (Signed)
 No flare today and using albuterol  prn. Continue.

## 2024-12-11 NOTE — Patient Instructions (Signed)
 We will check the b1 level to see where this is at. We will get you a new neurologist.

## 2024-12-15 LAB — VITAMIN B1: Vitamin B1 (Thiamine): 129 nmol/L — ABNORMAL HIGH (ref 8–30)

## 2024-12-18 ENCOUNTER — Ambulatory Visit: Payer: Self-pay | Admitting: Internal Medicine

## 2024-12-21 NOTE — Progress Notes (Unsigned)
 " Cardiology Office Note:    Date:  12/21/2024   ID:  Felicia Hanson, DOB 1956/09/29, MRN [id number]  PCP:  Rollene Almarie LABOR, MD   [facility] Providers Cardiologist:  Madonna Large, DO { Click to update primary MD,subspecialty MD or APP then REFRESH:1}    Referring MD: Rollene Almarie LABOR, MD   Chief complaint: Annual follow-up of chronic cardiovascular conditions     History of Present Illness:   Felicia Hanson is a 69 y.o. female with a hx of HFrEF, CAC, NICM, LBBB, HTN, HLD, T2DM, hypothyroidism, GAD, tobacco abuse presenting today for annual follow-up of chronic cardiovascular conditions.  03/2020 echo: LVEF 35-40%, G1 DD.  Lexiscan  Myoview 04/2020: Nondiagnostic EKG, underlying LBBB, hypertensive at rest and with stress.  Fixed mild defect of the inferior region due to diaphragmatic attenuation, no ischemia or scar.  LV moderately dilated, stress EF 21%.  High risk study due to low LVEF, findings suggesting NICM.  Hospitalized in 2022 for CHF, LVEF 35-40% with moderately decreased LV function.  R/LHC 12/2020: Mild diffuse disease throughout, mild-moderate PHTN 2/2 diastolic dysfunction with LVEDP of 32 mmHg with global hypokinesis, EF 35%, CO 6.5, CI 3.8.  Most recent follow-up outpatient was June 2024 with Dr. Large, was doing well at that time.   Most recent echo 05/2023 LVEF 40-45%, mildly decreased LV function, global hypokinesis, mild LVH, normal RV, trivial MV regurg, mild thickening of AV.  ROS:   Please see the history of present illness.    *** All other systems reviewed and are negative.     Past Medical History:  Diagnosis Date   Allergy    Anxiety    CHF (congestive heart failure) (HCC) 01/20/2021   COPD (chronic obstructive pulmonary disease) (HCC)    Coronary artery calcification    Depression    Diabetes mellitus without complication (HCC)    Diverticulitis    with colon resection   GERD (gastroesophageal reflux disease)    on pepcid    Hyperlipidemia    Hypertension    Sleep apnea    cpap on order 01-01-22   Sleep paralysis    will be tested for narcolepsy per pt 01-01-22   Thyroid  disease     Past Surgical History:  Procedure Laterality Date   BREAST EXCISIONAL BIOPSY Left    CESAREAN SECTION     x2   CHOLECYSTECTOMY     COLON SURGERY     diverticulitis with perforation; s/p colon resection.   Colonoscopy     COLONOSCOPY     DENTAL SURGERY     RIGHT/LEFT HEART CATH AND CORONARY ANGIOGRAPHY N/A 01/22/2021   Procedure: RIGHT/LEFT HEART CATH AND CORONARY ANGIOGRAPHY;  Surgeon: Ladona Heinz, MD;  Location: [facility];  Service: Cardiovascular;  Laterality: N/A;   TUBAL LIGATION      Current Medications: Active Medications[1]   Allergies:   Metformin and related, Nicotine , and Ace inhibitors   Social History   Socioeconomic History   Marital status: Married    Spouse name: Not on file   Number of children: 2   Years of education: some college   Highest education level: Not on file  Occupational History   Occupation: Retired  Tobacco Use   Smoking status: Every Day    Current packs/day: 0.25    Average packs/day: 0.3 packs/day for 38.0 years (9.5 ttl pk-yrs)    Types: Cigarettes    Passive exposure: Current   Smokeless tobacco: Never   Tobacco  comments:    2-5 A DAY  Vaping Use   Vaping status: Never Used  Substance and Sexual Activity   Alcohol use: Not Currently   Drug use: Never   Sexual activity: Not Currently  Other Topics Concern   Not on file  Social History Narrative   Marital status: married      Children:  2 children; 4 grandchildren      Lives: with husband, 2 granddaughters      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   Left-handed.   Caffeine use: 2.5 cups caffeine per day   Social Drivers of Health   Tobacco Use: High Risk (12/11/2024)   Patient History    Smoking Tobacco Use: Every Day    Smokeless Tobacco  Use: Never    Passive Exposure: Current  Financial Resource Strain: High Risk (03/30/2024)   Overall Financial Resource Strain (CARDIA)    Difficulty of Paying Living Expenses: Hard  Food Insecurity: No Food Insecurity (12/07/2024)   Epic    Worried About Programme Researcher, Broadcasting/film/video in the Last Year: Never true    Ran Out of Food in the Last Year: Never true  Transportation Needs: No Transportation Needs (12/07/2024)   Epic    Lack of Transportation (Medical): No    Lack of Transportation (Non-Medical): No  Physical Activity: Inactive (12/07/2024)   Exercise Vital Sign    Days of Exercise per Week: 0 days    Minutes of Exercise per Session: 0 min  Stress: No Stress Concern Present (12/07/2024)   [employer] of Occupational Health - Occupational Stress Questionnaire    Feeling of Stress: Only a little  Social Connections: Moderately Integrated (12/07/2024)   Social Connection and Isolation Panel    Frequency of Communication with Friends and Family: More than three times a week    Frequency of Social Gatherings with Friends and Family: Once a week    Attends Religious Services: 1 to 4 times per year    Active Member of [employer] or Organizations: No    Attends Banker Meetings: Never    Marital Status: Married  Depression (PHQ2-9): Low Risk (12/11/2024)   Depression (PHQ2-9)    PHQ-2 Score: 0  Alcohol Screen: Low Risk (06/17/2022)   Alcohol Screen    Last Alcohol Screening Score (AUDIT): 0  Housing: Low Risk (12/07/2024)   Epic    Unable to Pay for Housing in the Last Year: No    Number of Times Moved in the Last Year: 0    Homeless in the Last Year: No  Utilities: Not At Risk (12/07/2024)   Epic    Threatened with loss of utilities: No  Health Literacy: Adequate Health Literacy (12/07/2024)   B1300 Health Literacy    Frequency of need for help with medical instructions: Never     Family History: The patient's ***family history includes Breast cancer in her paternal aunt; Cancer in  her mother; Colon polyps in her mother; Esophageal cancer in her paternal uncle; Hyperlipidemia in her mother; Other in her father. There is no history of Colon cancer, Rectal cancer, or Stomach cancer.  EKGs/Labs/Other Studies Reviewed:    The following studies were reviewed today: ***      Recent Labs: 12/01/2024: ALT 8; BUN 18; Creatinine, Ser 1.47; Hemoglobin 14.0; Platelets 191.0; Potassium 3.9; Sodium 142  Recent Lipid Panel    Component Value  Date/Time   CHOL 117 12/01/2024 1525   CHOL 153 12/06/2020 1638   TRIG 154.0 (H) 12/01/2024 1525   HDL 47.30 12/01/2024 1525   HDL 46 12/06/2020 1638   CHOLHDL 2 12/01/2024 1525   VLDL 30.8 12/01/2024 1525   LDLCALC 39 12/01/2024 1525   LDLCALC 80 12/06/2020 1638   LDLDIRECT 69.0 08/05/2022 1404     Risk Assessment/Calculations:   {Does this patient have ATRIAL FIBRILLATION?:[phone]}  No BP recorded.  {Refresh Note OR Click here to enter BP  :1}***         Physical Exam:    VS:  There were no vitals taken for this visit.       Wt Readings from Last 3 Encounters:  12/11/24 161 lb (73 kg)  10/03/24 160 lb (72.6 kg)  08/15/24 164 lb 0.4 oz (74.4 kg)     GEN: *** Well nourished, well developed in no acute distress HEENT: Normal NECK: No JVD; No carotid bruits CARDIAC: *** S1-S2 normal, RRR, no murmurs, rubs, gallops RESPIRATORY:  Clear to auscultation without rales, wheezing or rhonchi  MUSCULOSKELETAL:  No edema; No deformity  SKIN: Warm and dry NEUROLOGIC:  Alert and oriented x 3 PSYCHIATRIC:  Normal affect       Assessment & Plan   Disposition: *** Route to primary cardiologist      {Are you ordering a CV Procedure (e.g. stress test, cath, DCCV, TEE, etc)?   Press F2        :[id number]}    Medication Adjustments/Labs and Tests Ordered: Current medicines are reviewed at length with the patient today.  Concerns regarding medicines are outlined above.  No orders of the defined types were placed in this  encounter.  No orders of the defined types were placed in this encounter.   There are no Patient Instructions on file for this visit.   Signed, Karoline Fleer E Jakita Dutkiewicz, NP  12/21/2024 6:42 PM    [facility]    [1]  No outpatient medications have been marked as taking for the 12/22/24 encounter (Appointment) with Giankarlo Leamer E, NP.   "

## 2024-12-22 ENCOUNTER — Telehealth: Payer: Self-pay | Admitting: Cardiology

## 2024-12-22 ENCOUNTER — Ambulatory Visit: Payer: Self-pay | Admitting: Emergency Medicine

## 2024-12-22 MED ORDER — SACUBITRIL-VALSARTAN 97-103 MG PO TABS: 1.0000 | ORAL_TABLET | Freq: Two times a day (BID) | ORAL | 0 refills | Status: AC

## 2024-12-22 NOTE — Telephone Encounter (Signed)
" °*  STAT* If patient is at the pharmacy, call can be transferred to refill team.   1. Which medications need to be refilled? (please list name of each medication and dose if known) sacubitril -valsartan  (ENTRESTO ) 97-103 MG    2. Would you like to learn more about the convenience, safety, & potential cost savings by using the [facility]? No      3. Are you open to using the [facility] ([facility]. No    4. Which pharmacy/location (including street and city if local pharmacy) is medication to be sent to?  [id number] - [place], [place] - [address]    5. Do they need a 30 day or 90 day supply? 90 day   Pt is out of medication  "

## 2024-12-22 NOTE — Telephone Encounter (Signed)
 Refills has been sent to the pharmacy.

## 2025-01-11 ENCOUNTER — Ambulatory Visit: Admitting: Neurology

## 2025-01-29 ENCOUNTER — Ambulatory Visit: Admitting: Emergency Medicine
